# Patient Record
Sex: Female | Born: 1975
Health system: Southern US, Community
[De-identification: ages and names within clinical notes are randomized; demographics above are authoritative.]

## PROBLEM LIST (undated history)

## (undated) DIAGNOSIS — K5730 Diverticulosis of large intestine without perforation or abscess without bleeding: Secondary | ICD-10-CM

## (undated) DIAGNOSIS — G4733 Obstructive sleep apnea (adult) (pediatric): Secondary | ICD-10-CM

## (undated) DIAGNOSIS — F32A Depression, unspecified: Secondary | ICD-10-CM

## (undated) DIAGNOSIS — I10 Essential (primary) hypertension: Secondary | ICD-10-CM

## (undated) DIAGNOSIS — E119 Type 2 diabetes mellitus without complications: Secondary | ICD-10-CM

## (undated) DIAGNOSIS — E282 Polycystic ovarian syndrome: Secondary | ICD-10-CM

## (undated) DIAGNOSIS — E7800 Pure hypercholesterolemia, unspecified: Secondary | ICD-10-CM

## (undated) DIAGNOSIS — G4730 Sleep apnea, unspecified: Secondary | ICD-10-CM

## (undated) DIAGNOSIS — Z9989 Dependence on other enabling machines and devices: Secondary | ICD-10-CM

## (undated) DIAGNOSIS — F329 Major depressive disorder, single episode, unspecified: Secondary | ICD-10-CM

## (undated) HISTORY — DX: Obstructive sleep apnea (adult) (pediatric): G47.33

## (undated) HISTORY — DX: Depression, unspecified: F32.A

## (undated) HISTORY — DX: Major depressive disorder, single episode, unspecified: F32.9

## (undated) HISTORY — DX: Obstructive sleep apnea (adult) (pediatric): Z99.89

## (undated) HISTORY — PX: ABDOMINAL HYSTERECTOMY: SHX81

## (undated) HISTORY — PX: KNEE SURGERY: SHX244

## (undated) HISTORY — PX: OVARIAN CYST REMOVAL: SHX89

## (undated) HISTORY — PX: CYST REMOVAL NECK: SHX6281

---

## 1998-06-07 ENCOUNTER — Emergency Department (HOSPITAL_COMMUNITY): Admission: EM | Admit: 1998-06-07 | Discharge: 1998-06-07 | Payer: Self-pay | Admitting: Emergency Medicine

## 1999-06-26 ENCOUNTER — Encounter: Admission: RE | Admit: 1999-06-26 | Discharge: 1999-07-07 | Payer: Self-pay | Admitting: *Deleted

## 1999-09-04 ENCOUNTER — Emergency Department (HOSPITAL_COMMUNITY): Admission: EM | Admit: 1999-09-04 | Discharge: 1999-09-04 | Payer: Self-pay | Admitting: Emergency Medicine

## 2000-03-16 ENCOUNTER — Other Ambulatory Visit: Admission: RE | Admit: 2000-03-16 | Discharge: 2000-03-16 | Payer: Self-pay | Admitting: *Deleted

## 2000-08-31 ENCOUNTER — Encounter: Admission: RE | Admit: 2000-08-31 | Discharge: 2000-08-31 | Payer: Self-pay | Admitting: Occupational Medicine

## 2000-08-31 ENCOUNTER — Encounter: Payer: Self-pay | Admitting: Occupational Medicine

## 2000-10-15 ENCOUNTER — Observation Stay (HOSPITAL_COMMUNITY): Admission: AD | Admit: 2000-10-15 | Discharge: 2000-10-16 | Payer: Self-pay | Admitting: Obstetrics and Gynecology

## 2000-10-15 ENCOUNTER — Encounter (INDEPENDENT_AMBULATORY_CARE_PROVIDER_SITE_OTHER): Payer: Self-pay

## 2000-11-30 ENCOUNTER — Ambulatory Visit (HOSPITAL_COMMUNITY): Admission: RE | Admit: 2000-11-30 | Discharge: 2000-11-30 | Payer: Self-pay | Admitting: Obstetrics and Gynecology

## 2000-11-30 ENCOUNTER — Encounter (INDEPENDENT_AMBULATORY_CARE_PROVIDER_SITE_OTHER): Payer: Self-pay

## 2001-06-10 ENCOUNTER — Emergency Department (HOSPITAL_COMMUNITY): Admission: EM | Admit: 2001-06-10 | Discharge: 2001-06-11 | Payer: Self-pay | Admitting: Emergency Medicine

## 2001-06-16 ENCOUNTER — Encounter: Payer: Self-pay | Admitting: Surgery

## 2001-06-16 ENCOUNTER — Ambulatory Visit (HOSPITAL_COMMUNITY): Admission: RE | Admit: 2001-06-16 | Discharge: 2001-06-16 | Payer: Self-pay | Admitting: Surgery

## 2001-06-29 ENCOUNTER — Ambulatory Visit (HOSPITAL_COMMUNITY): Admission: RE | Admit: 2001-06-29 | Discharge: 2001-06-29 | Payer: Self-pay | Admitting: General Surgery

## 2002-10-30 ENCOUNTER — Emergency Department (HOSPITAL_COMMUNITY): Admission: EM | Admit: 2002-10-30 | Discharge: 2002-10-30 | Payer: Self-pay | Admitting: Emergency Medicine

## 2003-01-19 ENCOUNTER — Encounter: Payer: Self-pay | Admitting: Emergency Medicine

## 2003-01-19 ENCOUNTER — Emergency Department (HOSPITAL_COMMUNITY): Admission: EM | Admit: 2003-01-19 | Discharge: 2003-01-19 | Payer: Self-pay | Admitting: Emergency Medicine

## 2003-10-30 ENCOUNTER — Ambulatory Visit (HOSPITAL_COMMUNITY): Admission: RE | Admit: 2003-10-30 | Discharge: 2003-10-30 | Payer: Self-pay | Admitting: *Deleted

## 2003-10-30 ENCOUNTER — Encounter (INDEPENDENT_AMBULATORY_CARE_PROVIDER_SITE_OTHER): Payer: Self-pay | Admitting: Specialist

## 2004-06-12 ENCOUNTER — Emergency Department (HOSPITAL_COMMUNITY): Admission: EM | Admit: 2004-06-12 | Discharge: 2004-06-12 | Payer: Self-pay | Admitting: Emergency Medicine

## 2004-08-15 ENCOUNTER — Inpatient Hospital Stay (HOSPITAL_COMMUNITY): Admission: EM | Admit: 2004-08-15 | Discharge: 2004-08-19 | Payer: Self-pay | Admitting: Emergency Medicine

## 2004-08-15 ENCOUNTER — Ambulatory Visit: Payer: Self-pay | Admitting: Internal Medicine

## 2004-08-31 ENCOUNTER — Ambulatory Visit (HOSPITAL_BASED_OUTPATIENT_CLINIC_OR_DEPARTMENT_OTHER): Admission: RE | Admit: 2004-08-31 | Discharge: 2004-08-31 | Payer: Self-pay | Admitting: Internal Medicine

## 2004-08-31 ENCOUNTER — Ambulatory Visit: Payer: Self-pay | Admitting: Internal Medicine

## 2004-09-08 ENCOUNTER — Ambulatory Visit: Payer: Self-pay | Admitting: Internal Medicine

## 2004-10-16 ENCOUNTER — Inpatient Hospital Stay (HOSPITAL_COMMUNITY): Admission: RE | Admit: 2004-10-16 | Discharge: 2004-10-21 | Payer: Self-pay | Admitting: Obstetrics

## 2004-10-16 ENCOUNTER — Encounter (INDEPENDENT_AMBULATORY_CARE_PROVIDER_SITE_OTHER): Payer: Self-pay | Admitting: *Deleted

## 2004-10-16 ENCOUNTER — Ambulatory Visit: Payer: Self-pay | Admitting: Pulmonary Disease

## 2004-10-18 ENCOUNTER — Encounter (INDEPENDENT_AMBULATORY_CARE_PROVIDER_SITE_OTHER): Payer: Self-pay | Admitting: Cardiology

## 2004-10-18 ENCOUNTER — Encounter: Payer: Self-pay | Admitting: Obstetrics

## 2004-11-02 ENCOUNTER — Ambulatory Visit (HOSPITAL_COMMUNITY): Admission: RE | Admit: 2004-11-02 | Discharge: 2004-11-02 | Payer: Self-pay | Admitting: Obstetrics

## 2005-05-04 ENCOUNTER — Emergency Department (HOSPITAL_COMMUNITY): Admission: EM | Admit: 2005-05-04 | Discharge: 2005-05-04 | Payer: Self-pay | Admitting: Family Medicine

## 2005-05-20 ENCOUNTER — Ambulatory Visit: Payer: Self-pay | Admitting: Internal Medicine

## 2005-05-27 ENCOUNTER — Ambulatory Visit (HOSPITAL_COMMUNITY): Admission: RE | Admit: 2005-05-27 | Discharge: 2005-05-27 | Payer: Self-pay | Admitting: Internal Medicine

## 2005-05-28 ENCOUNTER — Ambulatory Visit: Payer: Self-pay | Admitting: Internal Medicine

## 2005-06-10 ENCOUNTER — Ambulatory Visit: Payer: Self-pay | Admitting: Internal Medicine

## 2005-08-20 ENCOUNTER — Encounter: Admission: RE | Admit: 2005-08-20 | Discharge: 2005-08-20 | Payer: Self-pay | Admitting: Emergency Medicine

## 2005-09-07 ENCOUNTER — Encounter: Admission: RE | Admit: 2005-09-07 | Discharge: 2005-09-22 | Payer: Self-pay | Admitting: Emergency Medicine

## 2007-02-01 ENCOUNTER — Emergency Department (HOSPITAL_COMMUNITY): Admission: EM | Admit: 2007-02-01 | Discharge: 2007-02-02 | Payer: Self-pay | Admitting: Emergency Medicine

## 2007-02-24 ENCOUNTER — Telehealth (INDEPENDENT_AMBULATORY_CARE_PROVIDER_SITE_OTHER): Payer: Self-pay | Admitting: *Deleted

## 2007-02-24 DIAGNOSIS — G4730 Sleep apnea, unspecified: Secondary | ICD-10-CM | POA: Insufficient documentation

## 2007-02-24 DIAGNOSIS — I10 Essential (primary) hypertension: Secondary | ICD-10-CM | POA: Insufficient documentation

## 2007-02-24 DIAGNOSIS — E282 Polycystic ovarian syndrome: Secondary | ICD-10-CM | POA: Insufficient documentation

## 2007-02-24 DIAGNOSIS — E119 Type 2 diabetes mellitus without complications: Secondary | ICD-10-CM

## 2007-02-24 DIAGNOSIS — I152 Hypertension secondary to endocrine disorders: Secondary | ICD-10-CM | POA: Insufficient documentation

## 2007-02-24 DIAGNOSIS — G4733 Obstructive sleep apnea (adult) (pediatric): Secondary | ICD-10-CM | POA: Insufficient documentation

## 2007-02-24 DIAGNOSIS — E1165 Type 2 diabetes mellitus with hyperglycemia: Secondary | ICD-10-CM | POA: Insufficient documentation

## 2007-02-24 DIAGNOSIS — Z794 Long term (current) use of insulin: Secondary | ICD-10-CM | POA: Insufficient documentation

## 2007-02-27 ENCOUNTER — Ambulatory Visit: Payer: Self-pay | Admitting: Hospitalist

## 2007-02-27 ENCOUNTER — Encounter (INDEPENDENT_AMBULATORY_CARE_PROVIDER_SITE_OTHER): Payer: Self-pay | Admitting: Internal Medicine

## 2007-02-27 LAB — CONVERTED CEMR LAB
Beta hcg, urine, semiquantitative: NEGATIVE
Blood Glucose, Fingerstick: 148
Hgb A1c MFr Bld: 7.2 %

## 2007-02-28 LAB — CONVERTED CEMR LAB
BUN: 11 mg/dL (ref 6–23)
Calcium: 9.3 mg/dL (ref 8.4–10.5)
Chloride: 104 meq/L (ref 96–112)
Creatinine, Ser: 0.72 mg/dL (ref 0.40–1.20)
Creatinine, Urine: 209.8 mg/dL
Microalb, Ur: 6.17 mg/dL — ABNORMAL HIGH (ref 0.00–1.89)

## 2007-03-13 ENCOUNTER — Ambulatory Visit: Payer: Self-pay | Admitting: Internal Medicine

## 2007-03-13 ENCOUNTER — Encounter (INDEPENDENT_AMBULATORY_CARE_PROVIDER_SITE_OTHER): Payer: Self-pay | Admitting: Internal Medicine

## 2007-03-14 LAB — CONVERTED CEMR LAB
BUN: 14 mg/dL (ref 6–23)
Cholesterol: 228 mg/dL — ABNORMAL HIGH (ref 0–200)
Creatinine, Ser: 0.83 mg/dL (ref 0.40–1.20)
HDL: 29 mg/dL — ABNORMAL LOW (ref >39)
Potassium: 4.3 meq/L (ref 3.5–5.3)
Triglycerides: 391 mg/dL — ABNORMAL HIGH (ref <150)
VLDL: 78 mg/dL — ABNORMAL HIGH (ref 0–40)

## 2007-12-19 ENCOUNTER — Inpatient Hospital Stay (HOSPITAL_COMMUNITY): Admission: EM | Admit: 2007-12-19 | Discharge: 2007-12-20 | Payer: Self-pay | Admitting: Family Medicine

## 2007-12-19 ENCOUNTER — Ambulatory Visit: Payer: Self-pay | Admitting: Cardiology

## 2007-12-19 ENCOUNTER — Ambulatory Visit: Payer: Self-pay | Admitting: Internal Medicine

## 2007-12-20 ENCOUNTER — Encounter (INDEPENDENT_AMBULATORY_CARE_PROVIDER_SITE_OTHER): Payer: Self-pay | Admitting: Internal Medicine

## 2008-01-04 ENCOUNTER — Encounter: Payer: Self-pay | Admitting: Internal Medicine

## 2008-01-17 ENCOUNTER — Ambulatory Visit: Payer: Self-pay | Admitting: Infectious Disease

## 2008-01-18 ENCOUNTER — Encounter: Payer: Self-pay | Admitting: Internal Medicine

## 2008-01-18 ENCOUNTER — Telehealth: Payer: Self-pay | Admitting: Internal Medicine

## 2008-01-29 ENCOUNTER — Encounter: Payer: Self-pay | Admitting: Internal Medicine

## 2008-01-29 ENCOUNTER — Ambulatory Visit (HOSPITAL_BASED_OUTPATIENT_CLINIC_OR_DEPARTMENT_OTHER): Admission: RE | Admit: 2008-01-29 | Discharge: 2008-01-29 | Payer: Self-pay | Admitting: Internal Medicine

## 2008-02-01 ENCOUNTER — Ambulatory Visit: Payer: Self-pay | Admitting: Internal Medicine

## 2008-02-01 LAB — CONVERTED CEMR LAB: Blood Glucose, Home Monitor: 2 mg/dL

## 2008-02-03 ENCOUNTER — Ambulatory Visit: Payer: Self-pay | Admitting: Internal Medicine

## 2008-02-21 ENCOUNTER — Ambulatory Visit: Payer: Self-pay | Admitting: Internal Medicine

## 2008-02-21 DIAGNOSIS — E781 Pure hyperglyceridemia: Secondary | ICD-10-CM | POA: Insufficient documentation

## 2008-02-21 LAB — CONVERTED CEMR LAB: Hgb A1c MFr Bld: 7.6 %

## 2008-02-22 ENCOUNTER — Ambulatory Visit: Payer: Self-pay | Admitting: *Deleted

## 2008-02-22 ENCOUNTER — Encounter: Payer: Self-pay | Admitting: Internal Medicine

## 2008-03-20 LAB — CONVERTED CEMR LAB
Albumin: 4.6 g/dL (ref 3.5–5.2)
BUN: 16 mg/dL (ref 6–23)
CO2: 25 meq/L (ref 19–32)
Calcium: 9.2 mg/dL (ref 8.4–10.5)
Chloride: 105 meq/L (ref 96–112)
Cholesterol: 193 mg/dL (ref 0–200)
Glucose, Bld: 141 mg/dL — ABNORMAL HIGH (ref 70–99)
HDL: 27 mg/dL — ABNORMAL LOW (ref >39)
Lymphs Abs: 2.5 10*3/uL (ref 0.7–4.0)
Monocytes Relative: 7 % (ref 3–12)
Neutro Abs: 3.6 10*3/uL (ref 1.7–7.7)
Neutrophils Relative %: 53 % (ref 43–77)
Potassium: 4.4 meq/L (ref 3.5–5.3)
RBC: 4.56 M/uL (ref 3.87–5.11)
TSH: 1.449 microintl units/mL (ref 0.350–5.50)
Triglycerides: 257 mg/dL — ABNORMAL HIGH (ref <150)
WBC: 6.9 10*3/uL (ref 4.0–10.5)

## 2009-01-08 ENCOUNTER — Emergency Department (HOSPITAL_COMMUNITY): Admission: EM | Admit: 2009-01-08 | Discharge: 2009-01-08 | Payer: Self-pay | Admitting: Family Medicine

## 2009-07-19 ENCOUNTER — Emergency Department (HOSPITAL_COMMUNITY): Admission: EM | Admit: 2009-07-19 | Discharge: 2009-07-19 | Payer: Self-pay | Admitting: Emergency Medicine

## 2009-07-21 ENCOUNTER — Ambulatory Visit: Payer: Self-pay | Admitting: Internal Medicine

## 2009-07-23 ENCOUNTER — Ambulatory Visit: Payer: Self-pay | Admitting: Internal Medicine

## 2009-07-23 LAB — CONVERTED CEMR LAB: Blood Glucose, AC Bkfst: 241 mg/dL

## 2009-07-24 ENCOUNTER — Telehealth (INDEPENDENT_AMBULATORY_CARE_PROVIDER_SITE_OTHER): Payer: Self-pay | Admitting: Internal Medicine

## 2009-07-24 ENCOUNTER — Ambulatory Visit (HOSPITAL_COMMUNITY): Admission: RE | Admit: 2009-07-24 | Discharge: 2009-07-24 | Payer: Self-pay | Admitting: Internal Medicine

## 2009-07-24 LAB — CONVERTED CEMR LAB
Alkaline Phosphatase: 54 units/L (ref 39–117)
Glucose, Bld: 253 mg/dL — ABNORMAL HIGH (ref 70–99)
LDL Cholesterol: 115 mg/dL — ABNORMAL HIGH (ref 0–99)
Microalb Creat Ratio: 19.2 mg/g (ref 0.0–30.0)
Microalb, Ur: 2.22 mg/dL — ABNORMAL HIGH (ref 0.00–1.89)
Sodium: 140 meq/L (ref 135–145)
Total Bilirubin: 0.6 mg/dL (ref 0.3–1.2)
Total Protein: 7.1 g/dL (ref 6.0–8.3)
Triglycerides: 282 mg/dL — ABNORMAL HIGH (ref <150)
VLDL: 56 mg/dL — ABNORMAL HIGH (ref 0–40)

## 2009-07-25 ENCOUNTER — Telehealth: Payer: Self-pay | Admitting: *Deleted

## 2009-08-21 ENCOUNTER — Encounter: Payer: Self-pay | Admitting: Internal Medicine

## 2009-08-21 ENCOUNTER — Encounter (INDEPENDENT_AMBULATORY_CARE_PROVIDER_SITE_OTHER): Payer: Self-pay | Admitting: Internal Medicine

## 2009-08-21 LAB — HM DIABETES EYE EXAM

## 2009-10-16 ENCOUNTER — Ambulatory Visit: Payer: Self-pay | Admitting: Internal Medicine

## 2009-10-16 LAB — CONVERTED CEMR LAB: Blood Glucose, Fingerstick: 225

## 2009-10-25 ENCOUNTER — Encounter: Payer: Self-pay | Admitting: Internal Medicine

## 2009-11-06 ENCOUNTER — Emergency Department (HOSPITAL_COMMUNITY): Admission: EM | Admit: 2009-11-06 | Discharge: 2009-11-06 | Payer: Self-pay | Admitting: Emergency Medicine

## 2009-11-06 ENCOUNTER — Ambulatory Visit: Payer: Self-pay | Admitting: Internal Medicine

## 2009-11-06 LAB — CONVERTED CEMR LAB
ALT: 16 units/L (ref 0–35)
AST: 22 units/L (ref 0–37)
Alkaline Phosphatase: 52 units/L (ref 39–117)
Basophils Absolute: 0 10*3/uL (ref 0.0–0.1)
Basophils Relative: 1 % (ref 0–1)
Chloride: 101 meq/L (ref 96–112)
Creatinine, Ser: 0.75 mg/dL (ref 0.40–1.20)
Eosinophils Absolute: 0.3 10*3/uL (ref 0.0–0.7)
Lymphs Abs: 2.7 10*3/uL (ref 0.7–4.0)
MCV: 89.7 fL (ref >=78.0)
Neutrophils Relative %: 58 % (ref 43–77)
Platelets: 184 10*3/uL (ref 150–400)
RDW: 13.5 % (ref 11.5–15.5)
Total Bilirubin: 0.8 mg/dL (ref 0.3–1.2)
WBC: 8.6 10*3/uL (ref 4.0–10.5)

## 2009-11-15 ENCOUNTER — Emergency Department (HOSPITAL_COMMUNITY): Admission: EM | Admit: 2009-11-15 | Discharge: 2009-11-16 | Payer: Self-pay | Admitting: Emergency Medicine

## 2009-11-20 ENCOUNTER — Ambulatory Visit: Payer: Self-pay | Admitting: Internal Medicine

## 2009-11-21 ENCOUNTER — Telehealth: Payer: Self-pay | Admitting: Internal Medicine

## 2009-11-26 ENCOUNTER — Encounter: Payer: Self-pay | Admitting: Internal Medicine

## 2009-12-30 ENCOUNTER — Ambulatory Visit: Payer: Self-pay | Admitting: Internal Medicine

## 2009-12-30 LAB — CONVERTED CEMR LAB: Hgb A1c MFr Bld: 7.3 %

## 2009-12-31 ENCOUNTER — Encounter: Payer: Self-pay | Admitting: Internal Medicine

## 2010-01-05 ENCOUNTER — Encounter: Payer: Self-pay | Admitting: Internal Medicine

## 2010-01-09 LAB — CONVERTED CEMR LAB
ALT: 18 units/L (ref 0–35)
AST: 21 units/L (ref 0–37)
Albumin: 4.5 g/dL (ref 3.5–5.2)
BUN: 13 mg/dL (ref 6–23)
CO2: 26 meq/L (ref 19–32)
Calcium: 9.8 mg/dL (ref 8.4–10.5)
Chloride: 100 meq/L (ref 96–112)
Cholesterol: 145 mg/dL (ref 0–200)
Creatinine, Ser: 0.76 mg/dL (ref 0.40–1.20)
Eosinophils Absolute: 0.3 10*3/uL (ref 0.0–0.7)
Eosinophils Relative: 3 % (ref 0–5)
HCT: 42.5 % (ref 36.0–46.0)
Lymphocytes Relative: 35 % (ref 12–46)
Lymphs Abs: 3.4 10*3/uL (ref 0.7–4.0)
MCV: 88.4 fL (ref >=78.0)
Monocytes Relative: 7 % (ref 3–12)
Potassium: 4.3 meq/L (ref 3.5–5.3)
RBC: 4.81 M/uL (ref 3.87–5.11)
Total CHOL/HDL Ratio: 6
WBC: 9.7 10*3/uL (ref 4.0–10.5)

## 2010-02-03 ENCOUNTER — Encounter: Payer: Self-pay | Admitting: Internal Medicine

## 2010-02-26 ENCOUNTER — Ambulatory Visit (HOSPITAL_COMMUNITY): Admission: RE | Admit: 2010-02-26 | Discharge: 2010-02-27 | Payer: Self-pay | Admitting: Obstetrics and Gynecology

## 2010-02-26 ENCOUNTER — Encounter (INDEPENDENT_AMBULATORY_CARE_PROVIDER_SITE_OTHER): Payer: Self-pay | Admitting: Obstetrics and Gynecology

## 2010-03-05 ENCOUNTER — Ambulatory Visit: Payer: Self-pay | Admitting: Internal Medicine

## 2010-03-17 ENCOUNTER — Ambulatory Visit: Payer: Self-pay | Admitting: Internal Medicine

## 2010-03-18 ENCOUNTER — Ambulatory Visit: Payer: Self-pay | Admitting: Physical Medicine & Rehabilitation

## 2010-03-18 ENCOUNTER — Ambulatory Visit (HOSPITAL_COMMUNITY)
Admission: RE | Admit: 2010-03-18 | Discharge: 2010-03-18 | Payer: Self-pay | Admitting: Physical Medicine & Rehabilitation

## 2010-03-18 ENCOUNTER — Encounter
Admission: RE | Admit: 2010-03-18 | Discharge: 2010-06-10 | Payer: Self-pay | Admitting: Physical Medicine & Rehabilitation

## 2010-03-22 ENCOUNTER — Encounter
Admission: RE | Admit: 2010-03-22 | Discharge: 2010-03-22 | Payer: Self-pay | Admitting: Physical Medicine & Rehabilitation

## 2010-03-23 ENCOUNTER — Encounter: Payer: Self-pay | Admitting: Internal Medicine

## 2010-03-27 ENCOUNTER — Encounter
Admission: RE | Admit: 2010-03-27 | Discharge: 2010-06-25 | Payer: Self-pay | Admitting: Physical Medicine & Rehabilitation

## 2010-03-30 ENCOUNTER — Encounter: Payer: Self-pay | Admitting: Internal Medicine

## 2010-04-06 ENCOUNTER — Telehealth: Payer: Self-pay | Admitting: Internal Medicine

## 2010-05-01 ENCOUNTER — Ambulatory Visit: Payer: Self-pay | Admitting: Physical Medicine & Rehabilitation

## 2010-06-10 ENCOUNTER — Ambulatory Visit: Payer: Self-pay | Admitting: Physical Medicine & Rehabilitation

## 2010-06-11 ENCOUNTER — Encounter: Payer: Self-pay | Admitting: Internal Medicine

## 2010-06-29 ENCOUNTER — Encounter: Payer: Self-pay | Admitting: Internal Medicine

## 2010-06-29 ENCOUNTER — Encounter
Admission: RE | Admit: 2010-06-29 | Discharge: 2010-09-27 | Payer: Self-pay | Source: Home / Self Care | Attending: Internal Medicine | Admitting: Internal Medicine

## 2010-08-11 ENCOUNTER — Encounter: Payer: Self-pay | Admitting: Internal Medicine

## 2010-08-27 ENCOUNTER — Ambulatory Visit: Payer: Self-pay | Admitting: Internal Medicine

## 2010-08-27 LAB — CONVERTED CEMR LAB
ALT: 11 units/L (ref 0–35)
AST: 17 units/L (ref 0–37)
Albumin: 4.4 g/dL (ref 3.5–5.2)
Basophils Absolute: 0.1 10*3/uL (ref 0.0–0.1)
Blood Glucose, Fingerstick: 148
CO2: 25 meq/L (ref 19–32)
Calcium: 9.1 mg/dL (ref 8.4–10.5)
Chloride: 105 meq/L (ref 96–112)
Cholesterol: 152 mg/dL (ref 0–200)
Creatinine, Ser: 0.92 mg/dL (ref 0.40–1.20)
Eosinophils Relative: 3 % (ref 0–5)
HDL: 26 mg/dL — ABNORMAL LOW (ref >39)
Hgb A1c MFr Bld: 7.2 %
Lymphocytes Relative: 29 % (ref 12–46)
Lymphs Abs: 2.6 10*3/uL (ref 0.7–4.0)
Neutro Abs: 5.6 10*3/uL (ref 1.7–7.7)
Neutrophils Relative %: 61 % (ref 43–77)
Platelets: 191 10*3/uL (ref 150–400)
Potassium: 4.1 meq/L (ref 3.5–5.3)
RDW: 13.6 % (ref 11.5–15.5)
Total CHOL/HDL Ratio: 5.8
Total Protein: 6.6 g/dL (ref 6.0–8.3)
Triglycerides: 459 mg/dL — ABNORMAL HIGH (ref <150)
WBC: 9.1 10*3/uL (ref 4.0–10.5)

## 2010-09-17 ENCOUNTER — Ambulatory Visit (HOSPITAL_BASED_OUTPATIENT_CLINIC_OR_DEPARTMENT_OTHER)
Admission: RE | Admit: 2010-09-17 | Discharge: 2010-09-17 | Payer: Self-pay | Source: Home / Self Care | Attending: Internal Medicine | Admitting: Internal Medicine

## 2010-10-01 ENCOUNTER — Telehealth: Payer: Self-pay | Admitting: *Deleted

## 2010-10-01 ENCOUNTER — Emergency Department (HOSPITAL_COMMUNITY)
Admission: EM | Admit: 2010-10-01 | Discharge: 2010-10-01 | Payer: Self-pay | Source: Home / Self Care | Admitting: Family Medicine

## 2010-10-12 ENCOUNTER — Encounter: Payer: Self-pay | Admitting: Internal Medicine

## 2010-11-02 ENCOUNTER — Telehealth (INDEPENDENT_AMBULATORY_CARE_PROVIDER_SITE_OTHER): Payer: Self-pay | Admitting: *Deleted

## 2010-11-10 NOTE — Letter (Signed)
Summary: MEDLINK  MEDLINK   Imported By: Margie Billet 07/14/2010 14:41:50  _____________________________________________________________________  External Attachment:    Type:   Image     Comment:   External Document

## 2010-11-10 NOTE — Assessment & Plan Note (Signed)
Summary: 1 MONTH F/U/CFB   Vital Signs:  Patient profile:   35 year old female Height:      66 inches (167.64 cm) Weight:      282.7 pounds (128.50 kg) BMI:     45.79 Temp:     98.6 degrees F (37.00 degrees C) oral Pulse rate:   87 / minute BP sitting:   136 / 81  (right arm)  Vitals Entered By: Blenda Mounts (November 20, 2009 8:45 AM) CC: fu Is Patient Diabetic? Yes Did you bring your meter with you today? No Pain Assessment Patient in pain? no      Nutritional Status BMI of > 30 = obese CBG Result 201  Have you ever been in a relationship where you felt threatened, hurt or afraid?No   Does patient need assistance? Functional Status Self care, Shopping Ambulation Normal   CC:  fu.  History of Present Illness: Renee Ho comes in today for routine follow-up on her DM. She has been taking her medications as pr4escribed. She still has issues with intermittent abdominal pain felt to be pain from ovarian cysts. Her energy level is still low especially in teh afternoons when she is working. Otherwise she has no complaints.   Depression History:      The patient denies a depressed mood most of the day and a diminished interest in her usual daily activities.        The patient denies that she feels like life is not worth living, denies that she wishes that she were dead, and denies that she has thought about ending her life.         Preventive Screening-Counseling & Management  Alcohol-Tobacco     Alcohol type: very rarely     Smoking Status: quit     Year Quit: 2-4 years ago  Current Medications (verified): 1)  Metformin Hcl 1000 Mg Tabs (Metformin Hcl) .... Take 1 Tablet By Mouth Two Times A Day 2)  Anacin 81 Mg  Tbec (Aspirin) .... Take 1 Tablet By Mouth Once A Day 30 Minutes Before Niaspan 3)  Fish Oil 1000 Mg  Caps (Omega-3 Fatty Acids) .... Take One With Each Meal 4)  Simvastatin 40 Mg Tabs (Simvastatin) .... Take 1 Tablet By Mouth Once A Day 5)  Aldactone 50 Mg  Tabs (Spironolactone) .... Take 1 Tablet By Mouth Two Times A Day 6)  Victoza 18 Mg/50ml Soln (Liraglutide) .... Inject Daily As Directed  Allergies (verified): 1)  ! Morphine  Physical Exam  General:  alert.  obese,well-hydrated and appropriate dress.   Lungs:  normal breath sounds, no crackles, and no wheezes.   Heart:  normal rate, regular rhythm, no murmur, and no rub.   Abdomen:  soft and non-tender.  central adiposity. Msk:  no joint tenderness and no joint swelling.   Skin:  no rashes. hirsutism Psych:  Oriented X3, normally interactive, and good eye contact.     Impression & Recommendations:  Problem # 1:  DIABETES MELLITUS, TYPE II (ICD-250.00) CBG still high this am. Meter is broken at home. I spoke with her today about possibly going to Victoza or Byetta  in addition to her oral meds. It may have a secondarty benefit of weight loss. Will have her meet with Lupita Leash to discuss options. Will also get her set with meter etc.. and supplies. She is a [employer] employee so some supplies should be free.  Her updated medication list for this problem includes:    Metformin Hcl 1000  Mg Tabs (Metformin hcl) .[name]... Take 1 tablet by mouth two times a day    Anacin 81 Mg Tbec (Aspirin) .[name]... Take 1 tablet by mouth once a day 30 minutes before niaspan    Victoza 18 Mg/18ml Soln (Liraglutide) ..... Inject daily as directed  Orders: Capillary Blood Glucose/CBG (16109)  Labs Reviewed: Creat: 0.75 (11/06/2009)     Last Eye Exam: No diabetic retinopathy.    (08/21/2009) Reviewed HgBA1c results: 8.3 (10/16/2009)  8.5 (07/23/2009)  Problem # 2:  HYPERTRIGLYCERIDEMIA (ICD-272.1) Will check lipids at next visit. No issues with medication.  Her updated medication list for this problem includes:    Simvastatin 40 Mg Tabs (Simvastatin) .[name]... Take 1 tablet by mouth once a day  Problem # 3:  POLYCYSTIC OVARIAN DISEASE (ICD-256.4) Patient has severe monthly pelvic and ovarian pain. Has failed hormonal  treatment. I have her on spironolactone for the hirsuitism. I stressed weight loss. At this point she would like to see an OBGYN for possible TAH/oophorectomy.   Orders: Gynecologic Referral (Gyn)  Complete Medication List: 1)  Metformin Hcl 1000 Mg Tabs (Metformin hcl) .... Take 1 tablet by mouth two times a day 2)  Anacin 81 Mg Tbec (Aspirin) .... Take 1 tablet by mouth once a day 30 minutes before niaspan 3)  Fish Oil 1000 Mg Caps (Omega-3 fatty acids) .... Take one with each meal 4)  Simvastatin 40 Mg Tabs (Simvastatin) .... Take 1 tablet by mouth once a day 5)  Aldactone 50 Mg Tabs (Spironolactone) .... Take 1 tablet by mouth two times a day 6)  Victoza 18 Mg/70ml Soln (Liraglutide) .... Inject daily as directed  Patient Instructions: 1)  Please schedule a follow-up appointment in 1 month with Phillips Odor. Prescriptions: VICTOZA 18 MG/3ML SOLN (LIRAGLUTIDE) Inject daily as directed  #1 x 3   Entered and Authorized by:   Julaine Fusi  DO   Signed by:   Julaine Fusi  DO on 11/23/2009   Method used:   Electronically to        [facility]* (retail)       [address]       [address]mail[address]       [place], [place]  [address]       Ph: [phone]       Fax: [phone]   RxID:   [id number]   Prevention & Chronic Care Immunizations   Influenza vaccine: Not documented    Tetanus booster: Not documented    Pneumococcal vaccine: Not documented  Other Screening   Pap smear: Not documented   Smoking status: quit  (11/20/2009)  Diabetes Mellitus   HgbA1C: 8.3  (10/16/2009)    Eye exam: No diabetic retinopathy.     (08/21/2009)   Eye exam due: 08/2010    Foot exam: yes  (02/27/2007)   High risk foot: Not documented   Foot care education: Not documented    Urine microalbumin/creatinine ratio: 19.2  (07/23/2009)  Lipids   Total Cholesterol: 202  (07/23/2009)   LDL: 115  (07/23/2009)   LDL Direct: Not documented   HDL: 31   (07/23/2009)   Triglycerides: 282  (07/23/2009)    SGOT (AST): 22  (11/06/2009)   SGPT (ALT): 16  (11/06/2009)   Alkaline phosphatase: 52  (11/06/2009)   Total bilirubin: 0.8  (11/06/2009)  Hypertension   Last Blood Pressure: 136 / 81  (11/20/2009)   Serum creatinine: 0.75  (11/06/2009)   Serum potassium 4.1  (11/06/2009)  Self-Management Support :    Patient will work on the following items until the next clinic visit to reach self-care goals:     Medications and monitoring: take my medicines every day, check my blood sugar, check my blood pressure, bring all of my medications to every visit, examine my feet every day  (11/20/2009)     Eating: drink diet soda or water instead of juice or soda, eat more vegetables, use fresh or frozen vegetables, eat foods that are low in salt, eat baked foods instead of fried foods, eat fruit for snacks and desserts  (11/20/2009)     Activity: take a 30 minute walk every day, park at the far end of the parking lot  (11/20/2009)    Diabetes self-management support: Not documented   Last diabetes self-management training by diabetes educator: 02/01/2008   Last medical nutrition therapy: 02/01/2008    Hypertension self-management support: Not documented    Lipid self-management support: Not documented

## 2010-11-10 NOTE — Letter (Signed)
Summary: [facility]: FMLA  [facility]: FMLA   Imported By: Florinda Marker 10/28/2009 13:55:39  _____________________________________________________________________  External Attachment:    Type:   Image     Comment:   External Document

## 2010-11-10 NOTE — Letter (Signed)
Summary: [employer]/04-26/05-25  [employer]/04-26/05-25   Imported By: Margie Billet 03/10/2010 14:04:29  _____________________________________________________________________  External Attachment:    Type:   Image     Comment:   External Document

## 2010-11-10 NOTE — Assessment & Plan Note (Signed)
Summary: F/U/EST/VS   Vital Signs:  Patient profile:   35 year old female Height:      66 inches Weight:      270.44 pounds BMI:     43.81 Temp:     98.4 degrees F oral BP sitting:   128 / 84  (left arm) Cuff size:   large  Vitals Entered By: Angelina Ok RN (Mar 05, 2010 11:39 AM) CC: Depression Is Patient Diabetic? Yes Did you bring your meter with you today? Yes Pain Assessment Patient in pain? yes     Location: arms and abdomen Intensity: 10 Type: aching burning in arms. Sharp in abdomen. Onset of pain  Constant Nutritional Status BMI of > 30 = obese  Does patient need assistance? Functional Status Self care, Cook/clean Ambulation Normal Comments Problems with both arms post surgery on 02/26/2010 unable to pick up and hold items, ie cups water, brush hair, difficulty feeding self.   CC:  Depression.  History of Present Illness: Renee Ho comes in post hysterectomy POD 7 for dysmenorrhagia and fibroids as well as a history of PCOS. The surgery went well and she is having minimal pain except for in her bilateral arms. The pain in her arms started immediately after surgery. The pain is in her shoudlers and upper arms -bilaterllly. Worse with any movement. Some numbness and lancing pains. The patient reports that she has been told it may be damage from positioning during her surgery.  Depression History:      The patient denies a depressed mood most of the day but notes a diminished interest in her usual daily activities.        The patient denies that she feels like life is not worth living, denies that she wishes that she were dead, and denies that she has thought about ending her life.        Comments:  Post surgery on 02/26/2010.   Preventive Screening-Counseling & Management  Alcohol-Tobacco     Alcohol type: very rarely     Smoking Status: quit     Year Quit: 2-4 years ago  Current Medications (verified): 1)  Metformin Hcl 1000 Mg Tabs (Metformin Hcl) ....  Take 1 Tablet By Mouth Two Times A Day 2)  Anacin 81 Mg  Tbec (Aspirin) .... Take 1 Tablet By Mouth Once A Day 30 Minutes Before Niaspan 3)  Fish Oil 1000 Mg  Caps (Omega-3 Fatty Acids) .... Take One With Each Meal 4)  Simvastatin 40 Mg Tabs (Simvastatin) .... Take 1 Tablet By Mouth Once A Day 5)  Aldactone 50 Mg Tabs (Spironolactone) .... Take 1 Tablet By Mouth Two Times A Day 6)  Onetouch Ultra Test  Strp (Glucose Blood) .... Check Cbgs Three Times A Day or As Direrected. 7)  Onetouch .... Meter: Test As Directed 8)  Byetta 5 Mcg Pen 5 Mcg/0.63ml Soln (Exenatide) .... Inject Two Times A Day As Directed 9)  Onetouch Delica Lancets  Misc (Lancets) .... Use As Directed 10)  Prodigy Mini Pen Needles 31g X 5 Mm Misc (Insulin Pen Needle) .... Use As Directed 11)  Diazepam 5 Mg Tabs (Diazepam) .... Take 1 Tablet By Mouth Every 12 Hours As Needed For Muscle Spasm 12)  Lortab 10-500 Mg Tabs (Hydrocodone-Acetaminophen) .... Take 1-2 Tabs By Mouth Every 6 Hours As Needed Pain 13)  Polyethylene Glycol 3350  Pack (Polyethylene Glycol 3350) .... Take One Dose Daily By Mouth As Needed For Constipation As Directed.  Allergies (verified): 1)  !  Morphine  Review of Systems      See HPI  Physical Exam  General:  alert and well-developed.  Mild distress guarding both shoulders. Difficulty getting on exam table. Head:  Normocephalic and atraumatic without obvious abnormalities. No apparent alopecia or balding. Mouth:  Oral mucosa and oropharynx without lesions or exudates.  Teeth in good repair. Neck:  supple and full ROM.   Lungs:  normal breath sounds, no crackles, and no wheezes.   Heart:  normal rate, regular rhythm, no murmur, and no rub.   Abdomen:  Surgical site is clean healing well. Msk:  Intense pain and tenderness with palpation of both arms. Pain worse with aduction and accross the deltoids. No axilary adenopathy or pain. Hypertonicity of the neck and shoulder musculature. Cannot partcipate  with most shoulder and arm movement testing. Cannot perform due to pain. 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 Given the history it is plausible that this is due to OR positioning during a long surgery. I will inject both shoulders today and see if this relieves the inflammation and improves her symptoms. Have her f/u in 2 weeks- if not better will refer for PT and specialist evaluation  Problem # 2:  POLYCYSTIC OVARIAN DISEASE (ICD-256.4) Stable. On metformin and aldactone.  Problem # 3:  DIABETES MELLITUS, TYPE II (ICD-250.00) Doing well. A1C at next visit.  Her updated medication list for this problem includes:    Metformin Hcl 1000 Mg Tabs (Metformin hcl) .[name]... Take 1 tablet by mouth two times a day    Anacin 81 Mg Tbec (Aspirin) .[name]... Take 1 tablet by mouth once a day 30 minutes before niaspan    Byetta 5 Mcg Pen 5 Mcg/0.37ml Soln (Exenatide) ..... Inject two times a day as directed  Problem # 4:  HYPERTENSION (ICD-401.9) Doing well Her updated medication list for this problem includes:    Aldactone 50 Mg Tabs (Spironolactone) .[name]... Take 1 tablet by mouth two times a day  BP today: 128/84 Prior BP: 114/75 (12/30/2009)  Prior 10 Yr Risk Heart Disease: Not enough information (02/27/2007)  Labs Reviewed: K+: 4.3 (12/30/2009) Creat: : 0.76 (12/30/2009)   Chol: 145 (12/30/2009)   HDL: 24 (12/30/2009)   LDL: * mg/dL (08/67/6195)   TG: 093 (12/30/2009)  Complete Medication List: 1)  Metformin Hcl 1000 Mg Tabs (Metformin hcl) .... Take 1 tablet by mouth two times a day 2)  Anacin 81 Mg Tbec (Aspirin) .... Take 1 tablet by mouth once a day 30 minutes before niaspan 3)  Fish Oil 1000 Mg Caps (Omega-3 fatty acids) .... Take one with each meal 4)  Simvastatin 40 Mg Tabs (Simvastatin) .... Take 1 tablet by mouth once a day 5)  Aldactone  50 Mg Tabs (Spironolactone) .... Take 1 tablet by mouth two times a day 6)  Onetouch Ultra Test Strp (Glucose blood) .... Check cbgs three times a day or as direrected. 7)  Onetouch  .... Meter: test as directed 8)  Byetta 5 Mcg Pen 5 Mcg/0.38ml Soln (Exenatide) .... Inject two times a day as directed 9)  Onetouch Delica Lancets Misc (Lancets) .... Use as directed 10)  Prodigy Mini Pen Needles 31g X 5 Mm Misc (Insulin pen needle) .... Use as directed 11)  Diazepam 5 Mg Tabs (Diazepam) .... Take 1 tablet by mouth every 12 hours as needed for muscle  spasm 12)  Lortab 10-500 Mg Tabs (Hydrocodone-acetaminophen) .... Take 1-2 tabs by mouth every 6 hours as needed pain 13)  Polyethylene Glycol 3350 Pack (Polyethylene glycol 3350) .... Take one dose daily by mouth as needed for constipation as directed.  Other Orders: Joint Aspirate / Injection, Intermediate (13086)  Patient Instructions: 1)  [place] appt in July Prescriptions: POLYETHYLENE GLYCOL 3350  PACK (POLYETHYLENE GLYCOL 3350) Take one dose daily by mouth as needed for constipation as directed.  #1 bottle x 3   Entered and Authorized by:   Julaine Fusi  DO   Signed by:   Julaine Fusi  DO on 03/05/2010   Method used:   Print then Give to Patient   RxID:   [id number] LORTAB 10-500 MG TABS (HYDROCODONE-ACETAMINOPHEN) Take 1-2 tabs by mouth every 6 hours as needed pain  #60 x 0   Entered and Authorized by:   Julaine Fusi  DO   Signed by:   Julaine Fusi  DO on 03/05/2010   Method used:   Print then Give to Patient   RxID:   [id number] DIAZEPAM 5 MG TABS (DIAZEPAM) Take 1 tablet by mouth every 12 hours as needed for muscle spasm  #30 x 0   Entered and Authorized by:   Julaine Fusi  DO   Signed by:   Julaine Fusi  DO on 03/05/2010   Method used:   Print then Give to Patient   RxID:   [id number] POLYETHYLENE GLYCOL 3350  PACK (POLYETHYLENE GLYCOL 3350) Take one dose daily by mouth as needed for constipation as directed.  #1  bottle x 3   Entered and Authorized by:   Julaine Fusi  DO   Signed by:   Julaine Fusi  DO on 03/05/2010   Method used:   Print then Give to Patient   RxID:   [id number] LORTAB 10-500 MG TABS (HYDROCODONE-ACETAMINOPHEN) Take 1-2 tabs by mouth every 6 hours as needed pain  #60 x 0   Entered and Authorized by:   Julaine Fusi  DO   Signed by:   Julaine Fusi  DO on 03/05/2010   Method used:   Print then Give to Patient   RxID:   [id number] DIAZEPAM 5 MG TABS (DIAZEPAM) Take 1 tablet by mouth every 12 hours as needed for muscle spasm  #30 x 0   Entered and Authorized by:   Julaine Fusi  DO   Signed by:   Julaine Fusi  DO on 03/05/2010   Method used:   Print then Give to Patient   RxID:   [id number]    Procedure Note  Injections: The patient complains of pain and tenderness but denies irritation and inflammation.    Comment: Bilateral shoulders.   Cleaned and prepped with: alcohol Instructions: ice, out of work, and RTC in 10-14 days Additional Instructions: Used Surveyor, minerals .5:1 Lidocaine injected bilateral sub acromial space and points of maximal tenderness without complication. Rec general ROM at home.  Prevention & Chronic Care Immunizations   Influenza vaccine: Not documented    Tetanus booster: Not documented    Pneumococcal vaccine: Not documented  Other Screening   Pap smear: Not documented   Smoking status: quit  (03/05/2010)  Diabetes Mellitus   HgbA1C: 7.3  (12/30/2009)    Eye exam: No diabetic retinopathy.     (08/21/2009)   Eye exam due: 08/2010    Foot exam: yes  (02/27/2007)   High risk foot: Not documented   Foot care education: Not documented  Urine microalbumin/creatinine ratio: 19.2  (07/23/2009)  Lipids   Total Cholesterol: 145  (12/30/2009)   LDL: * mg/dL  (16/07/9603)   LDL Direct: Not documented   HDL: 24  (12/30/2009)   Triglycerides: 622  (12/30/2009)    SGOT (AST): 21  (12/30/2009)   SGPT (ALT): 18  (12/30/2009)    Alkaline phosphatase: 46  (12/30/2009)   Total bilirubin: 0.5  (12/30/2009)  Hypertension   Last Blood Pressure: 128 / 84  (03/05/2010)   Serum creatinine: 0.76  (12/30/2009)   Serum potassium 4.3  (12/30/2009)  Self-Management Support :    Patient will work on the following items until the next clinic visit to reach self-care goals:     Medications and monitoring: take my medicines every day, check my blood sugar, check my blood pressure, bring all of my medications to every visit, weigh myself weekly, examine my feet every day  (03/05/2010)     Eating: drink diet soda or water instead of juice or soda, eat more vegetables, use fresh or frozen vegetables, eat foods that are low in salt, eat baked foods instead of fried foods, eat fruit for snacks and desserts, limit or avoid alcohol  (03/05/2010)     Activity: take a 30 minute walk every day, take the stairs instead of the elevator  (12/30/2009)    Diabetes self-management support: Copy of home glucose meter record, Written self-care plan, Education handout, Pre-printed educational material  (03/05/2010)   Diabetes care plan printed   Diabetes education handout printed   Last diabetes self-management training by diabetes educator: 11/13/2009   Last medical nutrition therapy: 02/01/2008    Hypertension self-management support: Written self-care plan, Education handout, Pre-printed educational material  (03/05/2010)   Hypertension self-care plan printed.   Hypertension education handout printed    Lipid self-management support: Written self-care plan, Education handout, Pre-printed educational material  (03/05/2010)   Lipid self-care plan printed.   Lipid education handout printed

## 2010-11-10 NOTE — Assessment & Plan Note (Signed)
Summary: scheduled app/gg   Vital Signs:  Patient profile:   35 year old female Height:      66 inches (167.64 cm) Weight:      282.04 pounds (128.20 kg) BMI:     45.69 Temp:     97.2 degrees F (36.22 degrees C) oral Pulse rate:   90 / minute BP sitting:   132 / 78  (left arm) Cuff size:   large  Vitals Entered By: Angelina Ok RN (August 27, 2010 1:26 PM) CC: Depression Is Patient Diabetic? Yes Did you bring your meter with you today? Yes Pain Assessment Patient in pain? yes     Location: left foot  Intensity: 2 Type: aching Onset of pain  new shoes Nutritional Status BMI of > 30 = obese CBG Result 148  Have you ever been in a relationship where you felt threatened, hurt or afraid?No   Does patient need assistance? Functional Status Self care Ambulation Normal Comments Wants to have ears look up.   CC:  Depression.  History of Present Illness: Geoffery Spruce comes in today for routine care. She has been doing a great job with her diabetes and has recovered fully from an entrapment neuropathy of her upper extremities following GYN surgery several months ago. She overall reports doing very well. Significant imprrovement in her abdominal pain symptoms and feels much better post hysterectomy and free of menorrhagia that she had experienced prior to her surgery. She tells me that she feels like her health goals are attaianable- she is highly motivated. Concerns today include worsening facial hirsuitism and a new neuropathy in her feet. she says if she sits with her legs dangling that her feet get parasthesias and a several white spots develop on the plantar surfaces of her feet. She reporst doing well with the Byetta.   [facility] needs labs [phone] Attn Rose     Depression History:      The patient denies a depressed mood most of the day and a diminished interest in her usual daily activities.        The patient denies that she feels like life is not worth living, denies  that she wishes that she were dead, and denies that she has thought about ending her life.         Preventive Screening-Counseling & Management  Alcohol-Tobacco     Alcohol type: very rarely     Smoking Status: quit     Year Quit: 2-4 years ago  Current Medications (verified): 1)  Metformin Hcl 1000 Mg Tabs (Metformin Hcl) .... Take 1 Tablet By Mouth Two Times A Day 2)  Anacin 81 Mg  Tbec (Aspirin) .... Take 1 Tablet By Mouth Once A Day 30 Minutes Before Niaspan 3)  Fish Oil 1000 Mg  Caps (Omega-3 Fatty Acids) .... Take One With Each Meal 4)  Simvastatin 40 Mg Tabs (Simvastatin) .... Take 1 Tablet By Mouth Once A Day 5)  Aldactone 50 Mg Tabs (Spironolactone) .... Take 1 Tablet By Mouth Two Times A Day 6)  Onetouch Ultra Test  Strp (Glucose Blood) .... Check Cbgs Three Times A Day or As Direrected. 7)  Onetouch .... Meter: Test As Directed 8)  Byetta 5 Mcg Pen 5 Mcg/0.41ml Soln (Exenatide) .... Inject Two Times A Day As Directed 9)  Onetouch Delica Lancets  Misc (Lancets) .... Use As Directed 10)  Prodigy Mini Pen Needles 31g X 5 Mm Misc (Insulin Pen Needle) .... Use As Directed 11)  Polyethylene Glycol 3350  Pack (Polyethylene Glycol 3350) .... Take One Dose Daily By Mouth As Needed For Constipation As Directed. 12)  Gabapentin 300 Mg Caps (Gabapentin) .... Start Taking One Tab By Mouth At Bedtime X 1 Day Then Increase To Twice Daily 13)  Voltaren 1 % Gel (Diclofenac Sodium) .... Apply To Both Shoulders Twice Daily As Needed For Pain  Allergies: 1)  ! Morphine 2)  ! Reglan  Review of Systems      See HPI  Physical Exam  General:  alert and well-developed.  NAD.  Neck:  supple and no masses.   Lungs:  normal respiratory effort and normal breath sounds.   Heart:  normal rate, regular rhythm, and no murmur.   Abdomen:  soft and non-tender.   Msk:  no joint tenderness and no joint swelling.   Skin:  multiple skin tags around her neck, hirsutism Psych:  Oriented X3 and normally  interactive.    Diabetes Management Exam:    Foot Exam (with socks and/or shoes not present):       Sensory-Monofilament:          Left foot: normal          Right foot: normal   Impression & Recommendations:  Problem # 1:  HYPERTRIGLYCERIDEMIA (ICD-272.1) Will repeat Fasting labs. Had difficulty tolerating Niaspan.   Her updated medication list for this problem includes:    Simvastatin 40 Mg Tabs (Simvastatin) .[name]... 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 Needs a replacement in order to be effective. I suspect her needs will be different. Order placed for titration study.  Problem # 4:  POLYCYSTIC OVARIAN DISEASE (ICD-256.4) Concern over hirsutism. I have her on spironalactone, but she would likely need higher dose for androgen blocking- her BP however is under great control. So I will not change for now. We discussed topical options and dermatology referral.   Problem # 5:  DIABETES MELLITUS, TYPE II (ICD-250.00)  Discussed possiblly increasing her Byetta- her A1C has improved dramaticallly to 7.2- ideally would like to get her below 7.0. Will continue with diet and weightloss- her trend in A1C has been down- once she plateaus I will go up on the Byetta. Labs today.   Her updated medication list for this problem includes:    Metformin Hcl 1000 Mg Tabs (Metformin hcl) .[name]... Take 1 tablet by mouth two times a day    Anacin 81 Mg Tbec (Aspirin) .[name]... Take 1 tablet by mouth once a day 30 minutes before niaspan    Byetta 5 Mcg Pen 5 Mcg/0.31ml Soln (Exenatide) ..... Inject two times a day as directed  Orders: T- Capillary Blood Glucose (16109) T-Hgb A1C (in-house) (60454UJ) T-CMP with Estimated GFR ([id number]) T-Lipid Profile (95621-30865) T-CBC w/Diff (78469-62952)  Labs Reviewed: Creat:  0.76 (12/30/2009)     Last Eye Exam: No diabetic retinopathy.    (08/21/2009) Reviewed HgBA1c results: 7.2 (08/27/2010)  7.3 (12/30/2009)  Problem # 6:  HYPERTENSION (ICD-401.9)  Her updated medication list for this problem includes:    Aldactone 50 Mg Tabs (Spironolactone) .[name]... Take 1 tablet by mouth two times a day  Complete Medication List: 1)  Metformin Hcl 1000 Mg Tabs (Metformin hcl) .... Take 1 tablet by mouth two times a day 2)  Anacin 81 Mg Tbec (Aspirin) .Marland KitchenMarland KitchenMarland Kitchen  Take 1 tablet by mouth once a day 30 minutes before niaspan 3)  Fish Oil 1000 Mg Caps (Omega-3 fatty acids) .... Take one with each meal 4)  Simvastatin 40 Mg Tabs (Simvastatin) .... Take 1 tablet by mouth once a day 5)  Aldactone 50 Mg Tabs (Spironolactone) .... Take 1 tablet by mouth two times a day 6)  Onetouch Ultra Test Strp (Glucose blood) .... Check cbgs three times a day or as direrected. 7)  Onetouch  .... Meter: test as directed 8)  Byetta 5 Mcg Pen 5 Mcg/0.54ml Soln (Exenatide) .... Inject two times a day as directed 9)  Onetouch Delica Lancets Misc (Lancets) .... Use as directed 10)  Prodigy Mini Pen Needles 31g X 5 Mm Misc (Insulin pen needle) .... Use as directed 11)  Polyethylene Glycol 3350 Pack (Polyethylene glycol 3350) .... Take one dose daily by mouth as needed for constipation as directed. 12)  Gabapentin 300 Mg Caps (Gabapentin) .... Start taking one tab by mouth at bedtime x 1 day then increase to twice daily 13)  Voltaren 1 % Gel (Diclofenac sodium) .... Apply to both shoulders twice daily as needed for pain  Patient Instructions: 1)  Please schedule a follow-up appointment in 3 months.   Orders Added: 1)  T- Capillary Blood Glucose [82948] 2)  T-Hgb A1C (in-house) [[id number]] 3)  T-CMP with Estimated GFR [[address]] 4)  T-Lipid Profile [[id number]] 5)  T-CBC w/Diff [[id number]] 6)  Est. Patient Level IV [84132]    Prevention & Chronic Care Immunizations   Influenza vaccine: Given at  [place]/Employee  (08/27/2010)   Influenza vaccine due: 06/12/2011    Tetanus booster: Not documented   Td booster deferral: Deferred  (08/27/2010)    Pneumococcal vaccine: Not documented  Other Screening   Pap smear: Not documented   Pap smear action/deferral: Not indicated S/P hysterectomy  (08/27/2010)   Smoking status: quit  (08/27/2010)  Diabetes Mellitus   HgbA1C: 7.2  (08/27/2010)    Eye exam: No diabetic retinopathy.     (08/21/2009)   Diabetic eye exam action/deferral: Ophthalmology referral  (08/27/2010)   Eye exam due: 08/21/2010    Foot exam: yes  (08/27/2010)   High risk foot: No  (08/27/2010)   Foot care education: Done  (08/27/2010)   Foot exam due: 08/23/2011    Urine microalbumin/creatinine ratio: 19.2  (07/23/2009)   Urine microalbumin action/deferral: Not indicated    Diabetes flowsheet reviewed?: Yes   Progress toward A1C goal: Improved    Stage of readiness to change (diabetes management): Action   Diabetes comments: Motivated -doing very well  Lipids   Total Cholesterol: 145  (12/30/2009)   LDL: * mg/dL  (44/10/270)   LDL Direct: Not documented   HDL: 24  (12/30/2009)   Triglycerides: 622  (12/30/2009)    SGOT (AST): 21  (12/30/2009)   SGPT (ALT): 18  (12/30/2009)   Alkaline phosphatase: 46  (12/30/2009)   Total bilirubin: 0.5  (12/30/2009)    Lipid flowsheet reviewed?: Yes   Progress toward LDL goal: Deteriorated    Stage of readiness to change (lipid management): Action   Lipid comments: Has triglyceride disorder  Hypertension   Last Blood Pressure: 132 / 78  (08/27/2010)   Serum creatinine: 0.76  (12/30/2009)   Serum potassium 4.3  (12/30/2009)  Self-Management Support :    Patient will work on the following items until the next clinic visit to reach self-care goals:     Medications and monitoring: take my medicines every  day, check my blood sugar, bring all of my medications to every visit, examine my feet every day   (08/27/2010)     Eating: drink diet soda or water instead of juice or soda, eat more vegetables, use fresh or frozen vegetables, eat foods that are low in salt, eat baked foods instead of fried foods, eat fruit for snacks and desserts, limit or avoid alcohol  (08/27/2010)     Activity: take a 30 minute walk every day  (08/27/2010)    Diabetes self-management support: Written self-care plan, Education handout, Pre-printed educational material, Resources for patients handout  (08/27/2010)   Diabetes care plan printed   Diabetes education handout printed   Last diabetes self-management training by diabetes educator: 11/13/2009   Last medical nutrition therapy: 02/01/2008    Hypertension self-management support: Written self-care plan, Education handout, Pre-printed educational material, Resources for patients handout  (08/27/2010)   Hypertension self-care plan printed.   Hypertension education handout printed    Lipid self-management support: Written self-care plan, Education handout, Pre-printed educational material, Resources for patients handout  (08/27/2010)   Lipid self-care plan printed.   Lipid education handout printed      Resource handout printed.   Nursing Instructions: Refer for screening diabetic eye exam (see order)     Vital Signs:  Patient profile:   35 year old female Height:      66 inches (167.64 cm) Weight:      282.04 pounds (128.20 kg) BMI:     45.69 Temp:     97.2 degrees F (36.22 degrees C) oral Pulse rate:   90 / minute BP sitting:   132 / 78  (left arm) Cuff size:   large  Vitals Entered By: Angelina Ok RN (August 27, 2010 1:26 PM)    Last LDL:                                                 * mg/dL (16/07/9603 5:40:98 PM)            Diabetic Foot Exam Last Podiatry Exam Date: 02/21/2008 Foot Inspection Is there a history of a foot ulcer?              No Is there a foot ulcer now?              No Is there swelling or an abnormal foot  shape?          Yes Are the toenails long?                No Are the toenails thick?                No Are the toenails ingrown?              No Is there heavy callous build-up?              No Is there a claw toe deformity?                          No Is there elevated skin temperature?            No Is there limited ankle dorsiflexion?            No Is there foot or ankle muscle weakness?  No Do you have pain in calf while walking?           No      Diabetic Foot Care Education :Patient educated on appropriate care of diabetic feet.  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 High Risk Feet? No Set Next Diabetic Foot Exam here: 08/23/2011   10-g (5.07) Semmes-Weinstein Monofilament Test Performed by: Angelina Ok RN          Right Foot          Left Foot Visual Inspection               Test Control      normal         normal Site 1         normal         normal Site 2         normal         normal Site 3         normal         normal Site 4         normal         normal Site 5         normal         normal Site 6         normal         normal Site 7         normal         normal Site 8         normal         normal Site 9         normal         normal Site 10         normal         normal  Impression      normal         normal  Legend:  Site 1 = Plantar aspect of first toe (center of pad) Site 2 = Plantar aspect of third toe (center of pad) Site 3 = Plantar aspect of fifth toe (center of pad) Site 4 = Plantar aspect of first metatarsal head Site 5 = Plantar aspect of third metatarsal head Site 6 = Plantar aspect of fifth metatarsal head Site 7 = Plantar aspect of medial midfoot Site 8 = Plantar aspect of lateral midfoot Site 9 = Plantar aspect of heel Site 10 = dorsal aspect of foot between the base of the first and  second toes   Result is Abnormal if patient was unable to perceive the monofilament at site indicated.   Process Orders Check Orders Results:     [facility]: ABN not required for this insurance Tests Sent for requisitioning (August 31, 2010 11:30 AM):     08/27/2010: [facility] -- T-CMP with Estimated GFR [[id number]] (signed)     08/27/2010: [facility] -- T-Lipid Profile [phone] (signed)     08/27/2010: [facility] -- T-CBC w/Diff [[id number]] (signed)  Laboratory Results   Blood Tests   Date/Time Received: August 27, 2010 2:10 PM  Date/Time Reported: Burke Keels  August 27, 2010 2:10 PM   HGBA1C: 7.2%   (Normal Range: Non-Diabetic - 3-6%   Control Diabetic - 6-8%) CBG Random:: 148mg /dL

## 2010-11-10 NOTE — Assessment & Plan Note (Signed)
Summary: EST-TO ASSESS FOR FMLA PAPERS/CFB   Vital Signs:  Patient profile:   35 year old female Height:      66 inches (167.64 cm) Weight:      283.01 pounds (128.64 kg) BMI:     45.84 Temp:     98.3 degrees F (36.83 degrees C) oral Pulse rate:   76 / minute BP sitting:   137 / 85  (right arm)  Vitals Entered By: Angelina Ok RN (October 16, 2009 11:23 AM) Is Patient Diabetic? Yes Did you bring your meter with you today? No Pain Assessment Patient in pain? no      Nutritional Status BMI of > 30 = obese CBG Result 225  Have you ever been in a relationship where you felt threatened, hurt or afraid?No   Does patient need assistance? Functional Status Self care Ambulation Normal   History of Present Illness: "Renee Ho" is a 35 year old with severe PCOS and Diabetes who I initially saw a couple of years ago but has not followed up until now. She tells me that in general she dislikes going to the doctor, is mistrustful from bad experiences in the past. Her husband is here with her for support. She is mostly concerned about her diabetes and the effects of her PCOS on her body. She has monthly right LQ colicky pain- likely ovarina with negative CT w/u in recent past. She is minimally compliant with her dm treatment, and says she knows she has to do better. In regards to her PCOS, she wants a referral to a new GYN, since her last GYN removed a dermoid and this was complicated by wound infection and major complications that resulted in her being out of work for several months.  Preventive Screening-Counseling & Management  Alcohol-Tobacco     Alcohol type: very rarely     Smoking Status: quit     Year Quit: 2-4 years ago  Allergies: 1)  ! Morphine  Physical Exam  General:  alert.  obese,well-hydrated and appropriate dress.   Head:  normocephalic.   Eyes:  pupils equal, pupils round, and pupils reactive to light.   Neck:  supple.   Abdomen:  soft and non-tender.  central  adiposity. Msk:  no joint tenderness and no joint swelling.   Extremities:  trace left pedal edema and trace right pedal edema.   Skin:  no rashes. hirsutism Psych:  Oriented X3, normally interactive, and good eye contact.     Impression & Recommendations:  Problem # 1:  DIABETES MELLITUS, TYPE II (ICD-250.00) Leann admits to not taking her medication regularly. I think the first place to start is having her take her meds as presribed until her next visit in 1 month. At that point we can recheck her A1C and CBG. I would also like her to at least be checking her AM fasting blood sugars. She doesnt have a meter so I will have to set her up with DR for these supplys. She also needs quite a bit of education. 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  For today: Incresaed Metformin to 1000mg  two times a day Appt w/ DR Check CBGs F/U in 1 month  The following medications were removed from the medication list:    Lisinopril 20 Mg Tabs (Lisinopril) .[name]... Take 1 tablet by mouth once a day Her updated medication list for this problem includes:    Metformin Hcl 1000 Mg Tabs (Metformin hcl) .Marland KitchenMarland KitchenMarland KitchenMarland Kitchen  Take 1 tablet by mouth two times a day    Anacin 81 Mg Tbec (Aspirin) .[name]... Take 1 tablet by mouth once a day 30 minutes before niaspan  Orders: T- Capillary Blood Glucose (82948) T-Hgb A1C (in-house) ([id number])  Her updated medication list for this problem includes:    Lisinopril 20 Mg Tabs (Lisinopril) .[name]... Take 1 tablet by mouth once a day    Glucophage 850 Mg Tabs (Metformin hcl) .[name]... Take 1 tablet by mouth two times a day    Anacin 81 Mg Tbec (Aspirin) .[name]... Take 1 tablet by mouth once a day 30 minutes before niaspan  Problem # 2:  POLYCYSTIC OVARIAN DISEASE (ICD-256.4) I spent >20 minutes on weight loss educatiion and planning. This would heklp her disease tremndously. She is on Metformin. I have also started her on Spironolactone 50 two times a day for  anti-androgen effects and for her Hypertension. Will need to check her labs in 2 weeks.  Future Orders: T-Comprehensive Metabolic Panel [id number]) ... 11/06/2009 T-CBC w/Diff ([id number]) ... 11/06/2009  Problem # 3:  HYPERTRIGLYCERIDEMIA (ICD-272.1) The patient is intolerant to Niacin. I have changed her to Zocor- a more high potency statin vs. Pravachol. Will check LFTs in 6 weeks and re-check lipids  in 3 months. The following medications were removed from the medication list:    Niacin Cr 1000 Mg Cr-tabs (Niacin) .[name]... Take 1 pill by mouth at bedtime. Her updated medication list for this problem includes:    Simvastatin 40 Mg Tabs (Simvastatin) .[name]... Take 1 tablet by mouth once a day  Complete Medication List: 1)  Metformin Hcl 1000 Mg Tabs (Metformin hcl) .... Take 1 tablet by mouth two times a day 2)  Anacin 81 Mg Tbec (Aspirin) .... Take 1 tablet by mouth once a day 30 minutes before niaspan 3)  Fish Oil 1000 Mg Caps (Omega-3 fatty acids) .... Take one with each meal 4)  Simvastatin 40 Mg Tabs (Simvastatin) .... Take 1 tablet by mouth once a day 5)  Aldactone 50 Mg Tabs (Spironolactone) .... Take 1 tablet by mouth two times a day  Patient Instructions: 1)  Please schedule a follow-up appointment in 1 month. 2)  Follow up labs in 2 weeks. 3)  Appointment with Jamison Neighbor Prescriptions: ALDACTONE 50 MG TABS (SPIRONOLACTONE) Take 1 tablet by mouth two times a day  #60 x 3   Entered and Authorized by:   Julaine Fusi  DO   Signed by:   Julaine Fusi  DO on 10/16/2009   Method used:   Electronically to        [facility]* (retail)       [address]       [address] Shipping/mailing       [place], [place]  [address]       Ph: [phone]       Fax: [phone]   RxID:   [id number] SIMVASTATIN 40 MG TABS (SIMVASTATIN) Take 1 tablet by mouth once a day  #30 x 3   Entered and Authorized by:   Julaine Fusi  DO   Signed by:   Julaine Fusi  DO on  10/16/2009   Method used:   Electronically to        [facility]* (retail)       [address]       [address] Shipping/mailing       [place], [place]  [address]  Ph: [phone]       Fax: [phone]   RxID:   [id number] METFORMIN HCL 1000 MG TABS (METFORMIN HCL) Take 1 tablet by mouth two times a day  #60 x 3   Entered and Authorized by:   Julaine Fusi  DO   Signed by:   Julaine Fusi  DO on 10/16/2009   Method used:   Electronically to        [facility]* (retail)       [address]       [address] Shipping/mailing       [place], [place]  [address]       Ph: [phone]       Fax: [phone]   RxID:   [id number]   Prevention & Chronic Care Immunizations   Influenza vaccine: Not documented    Tetanus booster: Not documented    Pneumococcal vaccine: Not documented  Other Screening   Pap smear: Not documented   Smoking status: quit  (10/16/2009)  Diabetes Mellitus   HgbA1C: 8.3  (10/16/2009)    Eye exam: No diabetic retinopathy.     (08/21/2009)   Eye exam due: 08/2010    Foot exam: yes  (02/27/2007)   High risk foot: Not documented   Foot care education: Not documented    Urine microalbumin/creatinine ratio: 19.2  (07/23/2009)  Lipids   Total Cholesterol: 202  (07/23/2009)   LDL: 115  (07/23/2009)   LDL Direct: Not documented   HDL: 31  (07/23/2009)   Triglycerides: 282  (07/23/2009)    SGOT (AST): 21  (07/23/2009)   SGPT (ALT): 21  (07/23/2009) CMP ordered    Alkaline phosphatase: 54  (07/23/2009)   Total bilirubin: 0.6  (07/23/2009)  Hypertension   Last Blood Pressure: 137 / 85  (10/16/2009)   Serum creatinine: 0.68  (07/23/2009)   Serum potassium 4.5  (07/23/2009) CMP ordered   Self-Management Support :    Patient will work on the following items until the next clinic visit to reach self-care goals:     Medications and monitoring: take my medicines every day, check my blood sugar,  check my blood pressure, bring all of my medications to every visit  (10/16/2009)     Eating: drink diet soda or water instead of juice or soda, eat foods that are low in salt, eat baked foods instead of fried foods, eat fruit for snacks and desserts  (10/16/2009)     Activity: take a 30 minute walk every day, park at the far end of the parking lot  (10/16/2009)    Diabetes self-management support: Not documented   Last diabetes self-management training by diabetes educator: 02/01/2008   Last medical nutrition therapy: 02/01/2008    Hypertension self-management support: Not documented    Lipid self-management support: Not documented   Process Orders Check Orders Results:     [facility]: ABN not required for this insurance Tests Sent for requisitioning (October 20, 2009 12:07 PM):     11/06/2009: [facility] -- T-Comprehensive Metabolic Panel [[id number]] (signed)     11/06/2009: [facility] -- [facility] w/Diff [[id number]] (signed)    Laboratory Results   Blood Tests   Date/Time Received: October 16, 2009 11:33 AM Date/Time Reported: Alric Quan  October 16, 2009 11:33 AM  HGBA1C: 8.3%   (Normal Range: Non-Diabetic - 3-6%   Control Diabetic - 6-8%) CBG Random:: 225mg /dL

## 2010-11-10 NOTE — Progress Notes (Signed)
  Phone Note Outgoing Call   Call placed by: Jamison Neighbor RD,CDE,  November 21, 2009 11:04 AM Summary of Call: Patient request prescription for One touch ultra test strips, Byetta pen, 5mm pen needles, One touch delica lancets, The prescriptions were written out by DR. Woodyear and given to triage nurse to give to patient on Monday.  Follow-up for Phone Call        scripts entered into computer Follow-up by: Julaine Fusi  DO,  November 23, 2009 1:31 PM    New/Updated Medications: ONETOUCH ULTRA TEST  STRP (GLUCOSE BLOOD) Check CBGs three times a day or as direrected. * ONETOUCH Meter: test as directed BYETTA 5 MCG PEN 5 MCG/0.02ML SOLN (EXENATIDE) Inject two times a day as directed Prescriptions: BYETTA 5 MCG PEN 5 MCG/0.02ML SOLN (EXENATIDE) Inject two times a day as directed  #1 x 11   Entered and Authorized by:   Julaine Fusi  DO   Signed by:   Julaine Fusi  DO on 11/23/2009   Method used:   Handwritten   RxID:   [id number] ONETOUCH Meter: test as directed  #1 x 0   Entered and Authorized by:   Julaine Fusi  DO   Signed by:   Julaine Fusi  DO on 11/23/2009   Method used:   Handwritten   RxID:   [id number] ONETOUCH ULTRA TEST  STRP (GLUCOSE BLOOD) Check CBGs three times a day or as direrected.  #100 x 3   Entered and Authorized by:   Julaine Fusi  DO   Signed by:   Julaine Fusi  DO on 11/23/2009   Method used:   Electronically to        [facility] [address] [id number] (retail[facility]       [address]       [place], [place]  [address]       Ph: [phone]       Fax: [phone]   RxID:   [id number]   Appended Document:  these were called to [facility]

## 2010-11-10 NOTE — Miscellaneous (Signed)
Summary: [facility]  [facility]   Imported By: Margie Billet 01/08/2010 10:25:05  _____________________________________________________________________  External Attachment:    Type:   Image     Comment:   External Document

## 2010-11-10 NOTE — Letter (Signed)
Summary: [facility]  [facility]   Imported By: Margie Billet 07/15/2010 13:50:50  _____________________________________________________________________  External Attachment:    Type:   Image     Comment:   External Document

## 2010-11-10 NOTE — Progress Notes (Signed)
Summary: phone/gg  Phone Note Call from Patient   Caller: Patient Summary of Call: Pt called with questions about her FMLA.  Can you call her? She wants to talk about her visit with Dr Hermelinda Medicus.  Pt # [id number] Pt is on short term disability.  She is requesting her office notes  since surgery on May 19th be sent to [facility]  Fax # [phone]    claim # [phone]  and SS# [id number]      Initial call taken by: Merrie Roof RN,  April 06, 2010 11:11 AM  Follow-up for Phone Call        Called patient to follow-up. Got voicemail. I requested a return call. Follow-up by: Julaine Fusi  DO,  April 07, 2010 11:10 AM

## 2010-11-10 NOTE — Medication Information (Signed)
Summary: [facility] MEDICATION LIST  MED[facility] MEDICATION LIST   Imported By: Margie Billet 08/24/2010 15:23:13  _____________________________________________________________________  External Attachment:    Type:   Image     Comment:   External Document

## 2010-11-10 NOTE — Miscellaneous (Signed)
Summary: [facility]ICAL [facility]GICAL  [facility]   Imported By: Margie Billet 03/10/2010 10:42:15  _____________________________________________________________________  External Attachment:    Type:   Image     Comment:   External Document

## 2010-11-10 NOTE — Letter (Signed)
Summary: *Referral Letter  [facility]  [address]   [place], [place] [address]   Phone: [phone]  Fax: [phone]   12/31/2009  Thank you in advance for agreeing to see my patient:  Renee Ho [address] [place], [place]  [address]  Dear Dr. Cherly Hensen:  I have referred Renee Ho to you for evaulation and management of Renee Ho PCOS, Mennorhagia, and Chronic Pelvic Pain. From a medical standpoint, I have been managing the endocrinopathy with spironolactone and metformin. She has a long history of anovulatory infertility and has no plans or wishes for future pregnancy. We have been working on nutrition therapy, diabetes control and weight loss. Additionally, she has chronic mid to late cycle pelvic pain that she describes as being quite disabiling. Abdominal imaging has shown ovarian cysts. At this point I feel that I have maximized a medical approach and that she would likely be a good candidate for gynecologic evaluation and surgical intervention given the severity of Renee Ho symptoms.   Thank you in advance for you thoughtful consideration and assistance with Renee Ho case. Please contact me if you have any further questions or if I can be of assistance. A summary of Renee Ho medical record is below.  Reason for Referral: PCOS  Current Medical Problems: 1)  ABDOMINAL WALL PAIN (ICD-789.09) 2)  HYPERTRIGLYCERIDEMIA (ICD-272.1) 3)  POLYCYSTIC OVARIAN DISEASE (ICD-256.4) 4)  DERMOID CYST (ICD-229.9) 5)  SLEEP APNEA (ICD-780.57) 6)  HYPERTENSION (ICD-401.9) 7)  DIVERTICULITIS, HX OF (ICD-V12.79) 8)  DIABETES MELLITUS, TYPE II (ICD-250.00)  Current Medications: 1)  METFORMIN HCL 1000 MG TABS (METFORMIN HCL) Take 1 tablet by mouth two times a day 2)  ANACIN 81 MG  TBEC (ASPIRIN) Take 1 tablet by mouth once a day 30 minutes before Niaspan 3)  FISH OIL 1000 MG  CAPS (OMEGA-3 FATTY ACIDS) take one with each meal 4)  SIMVASTATIN 40 MG TABS (SIMVASTATIN) Take 1  tablet by mouth once a day 5)  ALDACTONE 50 MG TABS (SPIRONOLACTONE) Take 1 tablet by mouth two times a day 6)  BYETTA 5 MCG PEN 5 MCG/0.02ML SOLN (EXENATIDE) Inject two times a day as directed  Sincerely,    [employer]  DO

## 2010-11-10 NOTE — Letter (Signed)
Summary: CERTIFICATION OF HEALTH CARE PROVIDER  CERTIFICATION OF HEALTH CARE PROVIDER   Imported By: Margie Billet 04/01/2010 13:42:59  _____________________________________________________________________  External Attachment:    Type:   Image     Comment:   External Document

## 2010-11-10 NOTE — Assessment & Plan Note (Signed)
Summary: PER DR Phillips Odor, SHE WILL SEE/PCP-GOLDING/HLA   Vital Signs:  Patient profile:   35 year old female Height:      66 inches Weight:      270.0 pounds BMI:     43.74 Temp:     97.6 degrees F oral Pulse rate:   67 / minute BP sitting:   111 / 73  (right arm)  Vitals Entered By: Filomena Jungling NT II (March 17, 2010 8:44 AM) CC: follow-up visit Is Patient Diabetic? Yes Did you bring your meter with you today? No Nutritional Status BMI of > 30 = obese  Does patient need assistance? Functional Status Self care Ambulation Normal   CC:  follow-up visit.  History of Present Illness: Patient returns to office for follow-up on possible post-op upper extremity neuropathy/Upper extremity pain and weakness persist. Pain is 8/10 with lifting, light touch, upper lateral arms "burn". Steroid injections at last visit reduced some pain but movement is still a problem.  Cannot do basic tasks with UE at this point. Severe limitation present.   [phone] Dr. Riley Kill [phone] office  Preventive Screening-Counseling & Management  Alcohol-Tobacco     Alcohol type: very rarely     Smoking Status: quit     Year Quit: 2-4 years ago  Caffeine-Diet-Exercise     Does Patient Exercise: yes     Type of exercise: walking     Exercise (avg: min/session): 30 min.     Times/week: 3  Current Medications (verified): 1)  Metformin Hcl 1000 Mg Tabs (Metformin Hcl) .... Take 1 Tablet By Mouth Two Times A Day 2)  Anacin 81 Mg  Tbec (Aspirin) .... Take 1 Tablet By Mouth Once A Day 30 Minutes Before Niaspan 3)  Fish Oil 1000 Mg  Caps (Omega-3 Fatty Acids) .... Take One With Each Meal 4)  Simvastatin 40 Mg Tabs (Simvastatin) .... Take 1 Tablet By Mouth Once A Day 5)  Aldactone 50 Mg Tabs (Spironolactone) .... Take 1 Tablet By Mouth Two Times A Day 6)  Onetouch Ultra Test  Strp (Glucose Blood) .... Check Cbgs Three Times A Day or As Direrected. 7)  Onetouch .... Meter: Test As Directed 8)  Byetta 5 Mcg  Pen 5 Mcg/0.79ml Soln (Exenatide) .... Inject Two Times A Day As Directed 9)  Onetouch Delica Lancets  Misc (Lancets) .... Use As Directed 10)  Prodigy Mini Pen Needles 31g X 5 Mm Misc (Insulin Pen Needle) .... Use As Directed 11)  Diazepam 5 Mg Tabs (Diazepam) .... Take 1 Tablet By Mouth Every 12 Hours As Needed For Muscle Spasm 12)  Lortab 10-500 Mg Tabs (Hydrocodone-Acetaminophen) .... Take 1-2 Tabs By Mouth Every 6 Hours As Needed Pain 13)  Polyethylene Glycol 3350  Pack (Polyethylene Glycol 3350) .... Take One Dose Daily By Mouth As Needed For Constipation As Directed. 14)  Gabapentin 300 Mg Caps (Gabapentin) .... Start Taking One Tab By Mouth At Bedtime X 1 Day Then Increase To Twice Daily 15)  Voltaren 1 % Gel (Diclofenac Sodium) .... Apply To Both Shoulders Twice Daily As Needed For Pain  Allergies (verified): 1)  ! Morphine  Review of Systems      See HPI  Physical Exam  General:  alert and well-developed.  NAD.  Neck:  supple and no masses.   Lungs:  normal respiratory effort and normal breath sounds.   Heart:  normal rate, regular rhythm, and no murmur.   Abdomen:  soft and non-tender.   Msk:  bilateral  arms with significant reduction in strength. difficulty initiating abductiion on arm, guarding shoulder, wrist flexion and extention normal. Multiple tenderpoints. Deltoid is spasmed on both sides. Difficult to ilicit reflexes due to upper extremity spasm. Pain worse in d=right shoulder and lateral upper arm. Extremities:  no edema Neurologic:  alert & oriented X3 and cranial nerves II-XII intact.   Skin:  no rashes and no edema.   Psych:  Oriented X3, memory intact for recent and remote, normally interactive, good eye contact, and not anxious appearing.     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 Limited movement and ROM of both arms-localized to the upper  arm, deltoid region, but pain is radicular into the wrists and hands. Patient has never had this pain prior to her surgery a few weeks ago. Had immediate pain upon waking. Injections, pain meds and ice/heat have been minimally helpful. Difficulty doing almost all of her ADLs. She is tearful today and asking if this is going to get better. I am encouraged that tehre is some, although minor improvement. We need to get EMG/NCS to determine what nerve is involved and possibel an MRI. For the pain I have given her a refil on teh diazapam for muscle spasm, voltaren gel and will start neurontin short term.   Additionally, I spoke with Dr. Hermelinda Medicus with [facility] who also works with [facility] on the 4th floor Rehab unit. He will see her at 1030AM tomorrow for evaluation.   Problem # 2:  ABDOMINAL WALL PAIN (ICD-789.09) Resolved post hysterectomy Her updated medication list for this problem includes:    Anacin 81 Mg Tbec (Aspirin) .[name]... Take 1 tablet by mouth once a day 30 minutes before niaspan    Lortab 10-500 Mg Tabs (Hydrocodone-acetaminophen) .[name]... Take 1-2 tabs by mouth every 6 hours as needed pain  Complete Medication List: 1)  Metformin Hcl 1000 Mg Tabs (Metformin hcl) .... Take 1 tablet by mouth two times a day 2)  Anacin 81 Mg Tbec (Aspirin) .... Take 1 tablet by mouth once a day 30 minutes before niaspan 3)  Fish Oil 1000 Mg Caps (Omega-3 fatty acids) .... Take one with each meal 4)  Simvastatin 40 Mg Tabs (Simvastatin) .... Take 1 tablet by mouth once a day 5)  Aldactone 50 Mg Tabs (Spironolactone) .... Take 1 tablet by mouth two times a day 6)  Onetouch Ultra Test Strp (Glucose blood) .... Check cbgs three times a day or as direrected. 7)  Onetouch  .... Meter: test as directed 8)  Byetta 5 Mcg Pen 5 Mcg/0.25ml Soln (Exenatide) .... Inject two times a day as directed 9)  Onetouch Delica Lancets Misc (Lancets) .... Use as directed 10)  Prodigy Mini Pen Needles 31g X 5 Mm Misc (Insulin pen needle) ....  Use as directed 11)  Diazepam 5 Mg Tabs (Diazepam) .... Take 1 tablet by mouth every 12 hours as needed for muscle spasm 12)  Lortab 10-500 Mg Tabs (Hydrocodone-acetaminophen) .... Take 1-2 tabs by mouth every 6 hours as needed pain 13)  Polyethylene Glycol 3350 Pack (Polyethylene glycol 3350) .... Take one dose daily by mouth as needed for constipation as directed. 14)  Gabapentin 300 Mg Caps (Gabapentin) .... Start taking one tab by mouth at bedtime x 1 day then increase to twice daily 15)  Voltaren 1 % Gel (Diclofenac sodium) .... Apply to both shoulders twice daily as  needed for pain  Patient Instructions: 1)  Please schedule a follow-up appointment as needed. Prescriptions: VOLTAREN 1 % GEL (DICLOFENAC SODIUM) Apply to both shoulders twice daily as needed for pain  #1 tube x 2   Entered and Authorized by:   Julaine Fusi  DO   Signed by:   Julaine Fusi  DO on 03/17/2010   Method used:   Print then Give to Patient   RxID:   [id number] GABAPENTIN 300 MG CAPS (GABAPENTIN) Start taking one tab by mouth at bedtime X 1 day then increase to twice daily  #60 x 0   Entered and Authorized by:   Julaine Fusi  DO   Signed by:   Julaine Fusi  DO on 03/17/2010   Method used:   Print then Give to Patient   RxID:   [id number] DIAZEPAM 5 MG TABS (DIAZEPAM) Take 1 tablet by mouth every 12 hours as needed for muscle spasm  #30 x 1   Entered and Authorized by:   Julaine Fusi  DO   Signed by:   Julaine Fusi  DO on 03/17/2010   Method used:   Print then Give to Patient   RxID:   [id number]

## 2010-11-10 NOTE — Letter (Signed)
Summary: MED LINK  MED LINK   Imported By: Margie Billet 04/01/2010 10:59:56  _____________________________________________________________________  External Attachment:    Type:   Image     Comment:   External Document

## 2010-11-10 NOTE — Miscellaneous (Signed)
  Clinical Lists Changes  Medications: Removed medication of VICTOZA 18 MG/3ML SOLN (LIRAGLUTIDE) Inject daily as directed Added new medication of PRODIGY MINI PEN NEEDLES 31G X 5 MM MISC (INSULIN PEN NEEDLE) Use as directed - Signed Rx of PRODIGY MINI PEN NEEDLES 31G X 5 MM MISC (INSULIN PEN NEEDLE) Use as directed;  #100 x 6;  Signed;  Entered by: Julaine Fusi  DO;  Authorized by: Julaine Fusi  DO;  Method used: Electronically to [facility]*, [address] Shipping/mailing, [place], [place]  [address], Ph: [phone], Fax: [phone]    Prescriptions: PRODIGY MINI PEN NEEDLES 31G X 5 MM MISC (INSULIN PEN NEEDLE) Use as directed  #100 x 6   Entered and Authorized by:   Julaine Fusi  DO   Signed by:   Julaine Fusi  DO on 11/26/2009   Method used:   Electronically to        [facility]* (retail)       [address]       [address] Shipping/mailing       [place], [place]  [address]       Ph: [phone]       Fax: [phone]   RxID:   [id number]

## 2010-11-10 NOTE — Assessment & Plan Note (Signed)
Summary: F/U/EST/VS   Vital Signs:  Patient profile:   35 year old female Height:      66 inches Weight:      275.01 pounds BMI:     44.55 Temp:     98.8 degrees F oral Pulse rate:   86 / minute BP sitting:   114 / 75  Vitals Entered By: Angelina Ok RN (December 30, 2009 3:22 PM) Is Patient Diabetic? Yes Did you bring your meter with you today? Yes Pain Assessment Patient in pain? yes     Location: abdomen, ovaries Intensity: 7 Type: aching Onset of pain  Constant Nutritional Status BMI of > 30 = obese CBG Result 174  Have you ever been in a relationship where you felt threatened, hurt or afraid?No   Does patient need assistance? Functional Status Self care Ambulation Normal Comments Went to the GYN.  Check up.  Needs letter to Dr. Wonda Olds   History of Present Illness: Renee Ho comes in today for routine follow-up on her Diabetes Mellitus Type 2 and concerns over her PCOS. She saw Dr. Cherly Hensen last week and they are initiating a work-up for surgical management of her PCOS and Dysmenorrhea. Leann respots that she has lost weight and that she has been 100% compliant with her DM regimen. No lows. Felling like her energy level is better.  Depression History:      The patient denies a depressed mood most of the day and a diminished interest in her usual daily activities.        The patient denies that she feels like life is not worth living, denies that she wishes that she were dead, and denies that she has thought about ending her life.         Preventive Screening-Counseling & Management  Alcohol-Tobacco     Alcohol type: very rarely     Smoking Status: quit     Year Quit: 2-4 years ago  Current Medications (verified): 1)  Metformin Hcl 1000 Mg Tabs (Metformin Hcl) .... Take 1 Tablet By Mouth Two Times A Day 2)  Anacin 81 Mg  Tbec (Aspirin) .... Take 1 Tablet By Mouth Once A Day 30 Minutes Before Niaspan 3)  Fish Oil 1000 Mg  Caps (Omega-3 Fatty Acids) .... Take One  With Each Meal 4)  Simvastatin 40 Mg Tabs (Simvastatin) .... Take 1 Tablet By Mouth Once A Day 5)  Aldactone 50 Mg Tabs (Spironolactone) .... Take 1 Tablet By Mouth Two Times A Day 6)  Onetouch Ultra Test  Strp (Glucose Blood) .... Check Cbgs Three Times A Day or As Direrected. 7)  Onetouch .... Meter: Test As Directed 8)  Byetta 5 Mcg Pen 5 Mcg/0.38ml Soln (Exenatide) .... Inject Two Times A Day As Directed 9)  Onetouch Delica Lancets  Misc (Lancets) .... Use As Directed 10)  Prodigy Mini Pen Needles 31g X 5 Mm Misc (Insulin Pen Needle) .... Use As Directed  Allergies (verified): 1)  ! 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 Married. 2 children  Review of Systems      See HPI  Physical Exam  General:  alert.  obese,well-hydrated and appropriate dress.   Head:  normocephalic.   Eyes:  pupils equal, pupils round, and pupils reactive to light.   Mouth:  pharynx pink and moist.   Neck:  supple.   Lungs:  normal breath sounds, no crackles, and no wheezes.   Heart:  normal rate, regular rhythm, no murmur,  and no rub.   Abdomen:  soft and non-tender.  central adiposity. Msk:  normal ROM and no joint tenderness.   Skin:  hirsutism, mild acne Psych:  Oriented X3 and normally interactive.     Impression & Recommendations:  Problem # 1:  POLYCYSTIC OVARIAN DISEASE (ICD-256.4) Dr. Cherly Hensen is managing her GYN component. Associated with dysmenorrhea. Likely she will have a hysterectomy- possible oophorectomy.  Problem # 2:  DIABETES MELLITUS, TYPE II (ICD-250.00) Continue current regimen. A1C is ecellent- coming down very well.  Her updated medication list for this problem includes:    Metformin Hcl 1000 Mg Tabs (Metformin hcl) .[name]... Take 1 tablet by mouth two times a day    Anacin 81 Mg Tbec (Aspirin) .[name]... Take 1 tablet by mouth once a day 30 minutes before niaspan    Byetta 5 Mcg Pen 5 Mcg/0.41ml Soln (Exenatide) ..... Inject two times a day as  directed  Labs Reviewed: Creat: 0.75 (11/06/2009)     Last Eye Exam: No diabetic retinopathy.    (08/21/2009) Reviewed HgBA1c results: 8.3 (10/16/2009)  8.5 (07/23/2009)  Orders: T-Hgb A1C (in-house) ([id number])  Labs Reviewed: Creat: 0.75 (11/06/2009)     Last Eye Exam: No diabetic retinopathy.    (08/21/2009) Reviewed HgBA1c results: 7.3 (12/30/2009)  8.3 (10/16/2009)  Problem # 3:  SLEEP APNEA (ICD-780.57) Need to get records from sleep lab. Will likely need CPAP at night at some point.  Problem # 4:  HYPERTENSION (ICD-401.9) Spironalactone for anti androgen and BP control.  Her updated medication list for this problem includes:    Aldactone 50 Mg Tabs (Spironolactone) .[name]... Take 1 tablet by mouth two times a day  Complete Medication List: 1)  Metformin Hcl 1000 Mg Tabs (Metformin hcl) .... Take 1 tablet by mouth two times a day 2)  Anacin 81 Mg Tbec (Aspirin) .... Take 1 tablet by mouth once a day 30 minutes before niaspan 3)  Fish Oil 1000 Mg Caps (Omega-3 fatty acids) .... Take one with each meal 4)  Simvastatin 40 Mg Tabs (Simvastatin) .... Take 1 tablet by mouth once a day 5)  Aldactone 50 Mg Tabs (Spironolactone) .... Take 1 tablet by mouth two times a day 6)  Onetouch Ultra Test Strp (Glucose blood) .... Check cbgs three times a day or as direrected. 7)  Onetouch  .... Meter: test as directed 8)  Byetta 5 Mcg Pen 5 Mcg/0.46ml Soln (Exenatide) .... Inject two times a day as directed 9)  Onetouch Delica Lancets Misc (Lancets) .... Use as directed 10)  Prodigy Mini Pen Needles 31g X 5 Mm Misc (Insulin pen needle) .... Use as directed  Other Orders: T-Comprehensive Metabolic Panel [id number]) T-CBC w/Diff ([id number]) T-Lipid Profile ([id number])  Patient Instructions: 1)  Please schedule a follow-up appointment in 2 months. 2)  Pager: [phone]  Prevention & Chronic Care Immunizations   Influenza vaccine: Not documented    Tetanus booster: Not  documented    Pneumococcal vaccine: Not documented  Other Screening   Pap smear: Not documented   Smoking status: quit  (12/30/2009)  Diabetes Mellitus   HgbA1C: 7.3  (12/30/2009)    Eye exam: No diabetic retinopathy.     (08/21/2009)   Eye exam due: 08/2010    Foot exam: yes  (02/27/2007)   High risk foot: Not documented   Foot care education: Not documented    Urine microalbumin/creatinine ratio: 19.2  (07/23/2009)  Lipids   Total Cholesterol: 202  (07/23/2009)   LDL: 115  (07/23/2009)  LDL Direct: Not documented   HDL: 31  (07/23/2009)   Triglycerides: 282  (07/23/2009)    SGOT (AST): 22  (11/06/2009)   SGPT (ALT): 16  (11/06/2009) CMP ordered    Alkaline phosphatase: 52  (11/06/2009)   Total bilirubin: 0.8  (11/06/2009)  Hypertension   Last Blood Pressure: 114 / 75  (12/30/2009)   Serum creatinine: 0.75  (11/06/2009)   Serum potassium 4.1  (11/06/2009) CMP ordered   Self-Management Support :    Patient will work on the following items until the next clinic visit to reach self-care goals:     Medications and monitoring: take my medicines every day, check my blood sugar, bring all of my medications to every visit, examine my feet every day  (12/30/2009)     Eating: drink diet soda or water instead of juice or soda, eat more vegetables, use fresh or frozen vegetables, eat foods that are low in salt, eat baked foods instead of fried foods, eat fruit for snacks and desserts, limit or avoid alcohol  (12/30/2009)     Activity: take a 30 minute walk every day, take the stairs instead of the elevator  (12/30/2009)    Diabetes self-management support: Written self-care plan, Education handout  (12/30/2009)   Diabetes care plan printed   Diabetes education handout printed   Last diabetes self-management training by diabetes educator: 11/13/2009   Last medical nutrition therapy: 02/01/2008    Hypertension self-management support: Written self-care plan, Education  handout  (12/30/2009)   Hypertension self-care plan printed.   Hypertension education handout printed    Lipid self-management support: Written self-care plan, Education handout  (12/30/2009)   Lipid self-care plan printed.   Lipid education handout printed  Process Orders Check Orders Results:     [facility]: ABN not required for this insurance Tests Sent for requisitioning (January 09, 2010 6:43 PM):     12/30/2009: [facility] -- T-Comprehensive Metabolic Panel [[id number]] (signed)     12/30/2009: [facility] -- T-CBC w/Diff [[id number]] (signed)     12/30/2009: [facility] -- T-Lipid Profile [id number] (signed)     Vital Signs:  Patient profile:   35 year old female Height:      66 inches Weight:      275.01 pounds BMI:     44.55 Temp:     98.8 degrees F oral Pulse rate:   86 / minute BP sitting:   114 / 75  Vitals Entered By: Angelina Ok RN (December 30, 2009 3:22 PM)     Laboratory Results   Blood Tests   Date/Time Received: December 30, 2009 4:34 PM  Date/Time Reported: Burke Keels  December 30, 2009 4:35 PM   HGBA1C: 7.3%   (Normal Range: Non-Diabetic - 3-6%   Control Diabetic - 6-8%) CBG Random:: 174mg /dL

## 2010-11-10 NOTE — Assessment & Plan Note (Signed)
Summary: Teach meter testing   Allergies: 1)  ! Morphine   Complete Medication List: 1)  Metformin Hcl 1000 Mg Tabs (Metformin hcl) .... Take 1 tablet by mouth two times a day 2)  Anacin 81 Mg Tbec (Aspirin) .... Take 1 tablet by mouth once a day 30 minutes before niaspan 3)  Fish Oil 1000 Mg Caps (Omega-3 fatty acids) .... Take one with each meal 4)  Simvastatin 40 Mg Tabs (Simvastatin) .... Take 1 tablet by mouth once a day 5)  Aldactone 50 Mg Tabs (Spironolactone) .... Take 1 tablet by mouth two times a day  Other Orders: DSMT(Medicare) Individual, 30 Minutes (G1829)  Diabetes Self Management Training  PCP: Julaine Fusi  DO Referring MD: golding Date diagnosed with diabetes: 12/10/2003 Diabetes Type: Type 2 non-insulin treated Other persons present: no Current smoking Status: quit  Assessment Work Hours: Full Time Type of Work: CNA here at [employer] needs or Barriers: PCOS- is hoping pending TAH will solve.improve her diabetes, sleep apnea, etc Affect: Anxious  Diabetes Medications:  Lipid lowering Meds? Yes Anti-platelet Meds? Yes Herbs or Supplements: Yes Comments: gave one touch ultra mini meter and showed pateint how to use- able to use after 3 attemps- CBg 200 4 hours post meal. Discussed benefits costs and options of Byetta, victoza, oralmedications for diabetes. patient desires to try Byetta, successfully ave herself and injection of aline wiht syringe. have requested prescriptions from doctor.    Monitoring Self monitoring blood glucose 2 times a day Name of Meter  One Touch Ultramini  Recent Episodes of: Requiring Help from another person  Hyperglycemia : Yes Hypoglycemia: No Severe Hypoglycemia : No     Diabetes Disease Process  Discussed today  Medications State name-action-dose-duration-side effects-and time to take medication: Demonstrates competency   State appropriate timing of food related to medication: Demonstrates competency     Demonstrates/verbalizes site selection and rotation for injections Demonstrates competency   Correctly draw up and administer insulin-Byetta-Symlin-glucagon: Demonstrates competency    Nutritional Management  Monitoring Perform glucose monitoring/ketone testing and record results correctly: Demonstrates competency    State target blood glucose and HgbA1C goals: Needs review/assistance   Diabetes Management Education Done: 11/13/2009    BEHAVIORAL GOALS INITIAL Utilizing medications if for therapeutic effectiveness: start Byetta twice daily as soon as you get prescription filled Monitoring blood glucose levels daily: bring meter to visits        sent patient paperwork for Link to wellness employee diabetes program. Diabetes Self Management Support: clinic staff/doctor appointments Follow-up:monthly when signed up for Link ot wellness   Appended Document: Teach meter testing    Clinical Lists Changes  Medications: Added new medication of ONETOUCH DELICA LANCETS  MISC (LANCETS) use as directed - Signed Rx of ONETOUCH DELICA LANCETS  MISC (LANCETS) use as directed;  #100 x 3;  Signed;  Entered by: Julaine Fusi  DO;  Authorized by: Julaine Fusi  DO;  Method used: Handwritten    Prescriptions: ONETOUCH DELICA LANCETS  MISC (LANCETS) use as directed  #100 x 3   Entered and Authorized by:   Julaine Fusi  DO   Signed by:   Julaine Fusi  DO on 11/23/2009   Method used:   Handwritten   RxID:   [id number]

## 2010-11-12 NOTE — Progress Notes (Signed)
Summary: PAP Mammo result information  Phone Note Outgoing Call   Summary of Call: PAP  new result information found per [facility]   12-24-2009  Mammo- No information found  Alric Quan  November 02, 2010 12:27 PM'

## 2010-11-12 NOTE — Progress Notes (Signed)
Summary: cbg/ hla  Phone Note Other Incoming   Summary of Call: pt's charge nurse on [employer] at [employer] where pt works calls to say pt's cbg is 323, pt has taken her metformin and byetta, pt is weak, dizzy to the point of being assisted to a lying position and does not feel well enough to get up, she desires to be seen in clinic or given permission to take more metformin. it is advised that pt be taken to ED for evaluation by the int med team, chg nurse and pt object, i spoke w/ dr Meredith Pel and dr Phillips Odor. chg nurse is advised per dr Phillips Odor that ED is best for pt at this time. chg nurse states she will tell pt. Initial call taken by: Marin Roberts RN,  October 01, 2010 3:19 PM

## 2010-11-12 NOTE — Consult Note (Signed)
Summary: SLEEP MEDICINE  SLEEP MEDICINE   Imported By: Margie Billet 10/12/2010 13:37:27  _____________________________________________________________________  External Attachment:    Type:   Image     Comment:   External Document

## 2010-11-19 ENCOUNTER — Ambulatory Visit: Payer: Self-pay

## 2010-11-19 ENCOUNTER — Other Ambulatory Visit: Payer: Self-pay | Admitting: Occupational Medicine

## 2010-11-19 DIAGNOSIS — R52 Pain, unspecified: Secondary | ICD-10-CM

## 2010-11-27 ENCOUNTER — Telehealth: Payer: Self-pay | Admitting: *Deleted

## 2010-11-27 NOTE — Telephone Encounter (Signed)
Call from person at [facility] said that pt is in need of a new CPAP Machine.  Has not been sleeping well.  New order needs to be sent to [facility].  Pt has had a recent Sleep Study.  Original Machine is now 37 years old.

## 2010-11-27 NOTE — Telephone Encounter (Signed)
Please call [facility] and see what they need-ok to give verbal- she needs the machine ASAP-titration study has been done and is complete.

## 2010-11-30 NOTE — Telephone Encounter (Signed)
Referral was completed and given to Derrian.  Office person said that a verbal could not be done.

## 2010-12-01 ENCOUNTER — Other Ambulatory Visit: Payer: Self-pay | Admitting: Internal Medicine

## 2010-12-01 DIAGNOSIS — G4733 Obstructive sleep apnea (adult) (pediatric): Secondary | ICD-10-CM

## 2010-12-01 NOTE — Progress Notes (Signed)
CPAP orders given to [facility] on written script

## 2010-12-03 ENCOUNTER — Encounter: Payer: Self-pay | Admitting: Internal Medicine

## 2010-12-03 ENCOUNTER — Ambulatory Visit (INDEPENDENT_AMBULATORY_CARE_PROVIDER_SITE_OTHER): Payer: Commercial Managed Care - PPO | Admitting: Internal Medicine

## 2010-12-03 DIAGNOSIS — E785 Hyperlipidemia, unspecified: Secondary | ICD-10-CM

## 2010-12-03 DIAGNOSIS — E119 Type 2 diabetes mellitus without complications: Secondary | ICD-10-CM

## 2010-12-03 DIAGNOSIS — H6690 Otitis media, unspecified, unspecified ear: Secondary | ICD-10-CM

## 2010-12-03 DIAGNOSIS — M722 Plantar fascial fibromatosis: Secondary | ICD-10-CM

## 2010-12-03 MED ORDER — ROSUVASTATIN CALCIUM 10 MG PO TABS: 20.0000 mg | ORAL_TABLET | Freq: Every day | ORAL | Status: DC

## 2010-12-03 MED ORDER — SPIRONOLACTONE 50 MG PO TABS: 50.0000 mg | ORAL_TABLET | Freq: Two times a day (BID) | ORAL | Status: AC

## 2010-12-03 MED ORDER — EXENATIDE 5 MCG/0.02ML ~~LOC~~ SOPN: 5.0000 ug | PEN_INJECTOR | Freq: Two times a day (BID) | SUBCUTANEOUS | Status: DC

## 2010-12-03 MED ORDER — OMEGA-3-ACID ETHYL ESTERS 1 G PO CAPS: 2.0000 g | ORAL_CAPSULE | Freq: Two times a day (BID) | ORAL | Status: DC

## 2010-12-03 MED ORDER — AMOXICILLIN 500 MG PO CAPS: 500.0000 mg | ORAL_CAPSULE | Freq: Two times a day (BID) | ORAL | Status: AC

## 2010-12-04 ENCOUNTER — Other Ambulatory Visit: Payer: Self-pay | Admitting: *Deleted

## 2010-12-04 MED ORDER — EXENATIDE 5 MCG/0.02ML ~~LOC~~ SOPN: 5.0000 ug | PEN_INJECTOR | Freq: Two times a day (BID) | SUBCUTANEOUS | Status: DC

## 2010-12-04 NOTE — Telephone Encounter (Signed)
Pharmacy said that the dosage prescribed is 5 times the dosage that the pen is designed to deliver.

## 2010-12-21 LAB — POCT I-STAT, CHEM 8
BUN: 9 mg/dL (ref 6–23)
Calcium, Ion: 1.12 mmol/L (ref 1.12–1.32)
Creatinine, Ser: 0.7 mg/dL (ref 0.4–1.2)
Glucose, Bld: 178 mg/dL — ABNORMAL HIGH (ref 70–99)
TCO2: 26 mmol/L (ref 0–100)

## 2010-12-21 LAB — POCT URINALYSIS DIPSTICK
Bilirubin Urine: NEGATIVE
Hgb urine dipstick: NEGATIVE
Nitrite: NEGATIVE
Protein, ur: NEGATIVE mg/dL
Urobilinogen, UA: 0.2 mg/dL (ref 0.0–1.0)
pH: 5 (ref 5.0–8.0)

## 2010-12-28 LAB — BASIC METABOLIC PANEL
BUN: 15 mg/dL (ref 6–23)
BUN: 8 mg/dL (ref 6–23)
CO2: 27 mEq/L (ref 19–32)
Calcium: 8.4 mg/dL (ref 8.4–10.5)
Chloride: 102 mEq/L (ref 96–112)
Chloride: 103 mEq/L (ref 96–112)
Creatinine, Ser: 0.72 mg/dL (ref 0.4–1.2)
Creatinine, Ser: 0.8 mg/dL (ref 0.4–1.2)
GFR calc non Af Amer: >60 mL/min (ref >60)
Glucose, Bld: 219 mg/dL — ABNORMAL HIGH (ref 70–99)

## 2010-12-28 LAB — CBC
MCHC: 34.6 g/dL (ref 30.0–36.0)
MCV: 89.7 fL (ref 78.0–100.0)
MCV: 90.9 fL (ref 78.0–100.0)
Platelets: 163 10*3/uL (ref 150–400)
Platelets: 216 10*3/uL (ref 150–400)
RBC: 4.6 MIL/uL (ref 3.87–5.11)
RDW: 13.3 % (ref 11.5–15.5)
WBC: 12.2 10*3/uL — ABNORMAL HIGH (ref 4.0–10.5)

## 2010-12-28 LAB — MRSA PCR SCREENING: MRSA by PCR: NEGATIVE

## 2010-12-28 LAB — PREGNANCY, URINE: Preg Test, Ur: NEGATIVE

## 2010-12-30 LAB — DIFFERENTIAL
Basophils Absolute: 0 10*3/uL (ref 0.0–0.1)
Lymphocytes Relative: 45 % (ref 12–46)
Monocytes Absolute: 0.4 10*3/uL (ref 0.1–1.0)
Neutro Abs: 2.6 10*3/uL (ref 1.7–7.7)

## 2010-12-30 LAB — POCT I-STAT, CHEM 8
BUN: 12 mg/dL (ref 6–23)
Calcium, Ion: 1.11 mmol/L — ABNORMAL LOW (ref 1.12–1.32)
Chloride: 110 mEq/L (ref 96–112)
Sodium: 143 mEq/L (ref 135–145)

## 2010-12-30 LAB — CBC
Hemoglobin: 14.6 g/dL (ref 12.0–15.0)
RDW: 13 % (ref 11.5–15.5)
WBC: 5.7 10*3/uL (ref 4.0–10.5)

## 2010-12-30 LAB — GLUCOSE, CAPILLARY: Glucose-Capillary: 201 mg/dL — ABNORMAL HIGH (ref 70–99)

## 2011-01-01 LAB — GLUCOSE, CAPILLARY: Glucose-Capillary: 174 mg/dL — ABNORMAL HIGH (ref 70–99)

## 2011-01-14 LAB — GLUCOSE, CAPILLARY: Glucose-Capillary: 241 mg/dL — ABNORMAL HIGH (ref 70–99)

## 2011-01-14 LAB — HEPATIC FUNCTION PANEL
AST: 33 U/L (ref 0–37)
Bilirubin, Direct: 0.2 mg/dL (ref 0.0–0.3)

## 2011-01-14 LAB — URINALYSIS, ROUTINE W REFLEX MICROSCOPIC
Leukocytes, UA: NEGATIVE
Nitrite: NEGATIVE
Specific Gravity, Urine: 1.044 — ABNORMAL HIGH (ref 1.005–1.030)
Urobilinogen, UA: 0.2 mg/dL (ref 0.0–1.0)

## 2011-01-14 LAB — POCT I-STAT, CHEM 8
Glucose, Bld: 249 mg/dL — ABNORMAL HIGH (ref 70–99)
HCT: 50 % — ABNORMAL HIGH (ref 36.0–46.0)
Hemoglobin: 17 g/dL — ABNORMAL HIGH (ref 12.0–15.0)
Potassium: 4.3 mEq/L (ref 3.5–5.1)
Sodium: 138 mEq/L (ref 135–145)
TCO2: 29 mmol/L (ref 0–100)

## 2011-01-14 LAB — URINE MICROSCOPIC-ADD ON

## 2011-01-14 LAB — CBC
MCHC: 34.4 g/dL (ref 30.0–36.0)
RDW: 13.6 % (ref 11.5–15.5)

## 2011-01-14 LAB — DIFFERENTIAL
Basophils Absolute: 0 10*3/uL (ref 0.0–0.1)
Basophils Relative: 1 % (ref 0–1)
Neutro Abs: 4.6 10*3/uL (ref 1.7–7.7)
Neutrophils Relative %: 60 % (ref 43–77)

## 2011-02-03 ENCOUNTER — Other Ambulatory Visit: Payer: Self-pay | Admitting: Internal Medicine

## 2011-02-03 NOTE — Progress Notes (Signed)
Will have patient come in for lab only appointment. Results need to be faxed to [facility].

## 2011-02-05 ENCOUNTER — Other Ambulatory Visit: Payer: Self-pay | Admitting: Internal Medicine

## 2011-02-05 ENCOUNTER — Other Ambulatory Visit (INDEPENDENT_AMBULATORY_CARE_PROVIDER_SITE_OTHER): Payer: Commercial Managed Care - PPO

## 2011-02-05 DIAGNOSIS — E119 Type 2 diabetes mellitus without complications: Secondary | ICD-10-CM

## 2011-02-05 LAB — POCT GLYCOSYLATED HEMOGLOBIN (HGB A1C): Hemoglobin A1C: 8.4

## 2011-02-06 LAB — LIPID PANEL
Cholesterol: 198 mg/dL (ref 0–200)
Triglycerides: 905 mg/dL — ABNORMAL HIGH (ref <150)

## 2011-02-06 LAB — MICROALBUMIN / CREATININE URINE RATIO
Creatinine, Urine: 164.2 mg/dL
Microalb Creat Ratio: 8.6 mg/g (ref 0.0–30.0)
Microalb, Ur: 1.41 mg/dL (ref 0.00–1.89)

## 2011-02-06 LAB — COMPREHENSIVE METABOLIC PANEL
AST: 17 U/L (ref 0–37)
Albumin: 4.3 g/dL (ref 3.5–5.2)
BUN: 15 mg/dL (ref 6–23)
CO2: 25 mEq/L (ref 19–32)
Calcium: 9.3 mg/dL (ref 8.4–10.5)
Chloride: 102 mEq/L (ref 96–112)
Creat: 0.76 mg/dL (ref 0.40–1.20)
Glucose, Bld: 205 mg/dL — ABNORMAL HIGH (ref 70–99)

## 2011-02-15 ENCOUNTER — Other Ambulatory Visit: Payer: Self-pay | Admitting: Internal Medicine

## 2011-02-15 DIAGNOSIS — E785 Hyperlipidemia, unspecified: Secondary | ICD-10-CM

## 2011-02-15 MED ORDER — ROSUVASTATIN CALCIUM 10 MG PO TABS: 10.0000 mg | ORAL_TABLET | Freq: Every day | ORAL | Status: DC

## 2011-02-15 MED ORDER — GEMFIBROZIL 600 MG PO TABS: 600.0000 mg | ORAL_TABLET | Freq: Two times a day (BID) | ORAL | Status: AC

## 2011-02-15 MED ORDER — EXENATIDE 5 MCG/0.02ML ~~LOC~~ SOPN: 10.0000 ug | PEN_INJECTOR | Freq: Two times a day (BID) | SUBCUTANEOUS | Status: DC

## 2011-02-23 ENCOUNTER — Telehealth: Payer: Self-pay | Admitting: *Deleted

## 2011-02-23 NOTE — Discharge Summary (Signed)
[name], Renee               ACCOUNT NO.:  [id number]   MEDICAL RECORD NO.:  [id number]          PATIENT TYPE:  INP   LOCATION:  3711                         FACILITY:  [facility]   PHYSICIAN:  Renee Ho, M.D.    DATE OF BIRTH:  June 05, 1976   DATE OF ADMISSION:  12/19/2007  DATE OF DISCHARGE:  12/20/2007                               DISCHARGE SUMMARY   DISCHARGE DIAGNOSES:  1. Progressive dyspnea secondary to diastolic dysfunction with      probable restrictive component secondary to morbid obesity.  2. Hypertension.  3. Type 2 diabetes mellitus.  4. Hypertriglyceridemia.  5. Obstructive sleep apnea, on CPAP.  6. Polycystic ovary syndrome.  7. Morbid obesity.   DISCHARGE MEDICATIONS:  1. Metformin 850 mg p.o. b.i.d.  2. Lopid 600 mg p.o. b.i.d.  3. Lisinopril 20 mg p.o. daily.   DISPOSITION:  Patient was discharged in hemodynamically stable condition  after any life-threatening causes of shortness of breath had been ruled  out.  She has been scheduled to follow up with Dr. Anderson Malta in  the outpatient clinic at [facility] on April 8th at 2 p.m.  At that  time, please monitor the patient's blood pressure control and diabetes  control.  She will need a hemoglobin A1C as well as a BMET since she was  restarted on an ACE inhibitor.  In about six weeks time, she will also  need a CMET and a fasting lipid profile to monitor her liver function  tests.  Otherwise, the patient has been referred to [facility] for full pulmonary function tests that will be done on March 16th  at noon.   CONSULTATIONS:  None.   PROCEDURES:  A chest x-ray on March 10th showed no acute findings.   ADMISSION HISTORY:  Renee Ho is a 35 year old woman with morbid  obesity related problems who woke up on the morning of admission with  sudden onset of dyspnea associated with chest heaviness and heart  palpitations.  She also noticed that she was orthopneic and was unable  to  lay flat after waking up.  She went to work despite feeling short of  breath and noticed that she was more short of breath than usual with  exertion.  At baseline, she does not usually have any dyspnea.  Her  discomfort persisted throughout the day.  The patient does not have a  history of prior DVT or pulmonary embolism.  She denied any recent  surgery, travel, or lower extremity symptoms.  She also denied any  cough, fever, or hemoptysis.   ADMISSION PHYSICAL:  Temperature 97.8, blood pressure 145/100, heart  rate 86, respiratory rate 20, O2 sat 95% on room air.  GENERAL:  An obese young woman in no acute distress.  HEENT:  EOMI.  PERRLA.  Anicteric sclerae.  Moist mucous membranes.  NECK:  Obese.  Difficult to assess for JVD.  RESPIRATORY:  Lungs clear to auscultation bilaterally with fair air  movement.  CARDIOVASCULAR:  Regular rate and rhythm.  No murmur or gallop  appreciated.  GI:  Bowel sounds positive.  ABDOMEN:  Soft, nontender, nondistended but obese.  EXTREMITIES:  No calf tenderness, edema, or erythema.  NEURO:  Grossly nonfocal.   ADMISSION LABS:  Sodium 134, potassium 4.1, chloride 109, bicarb 23.8,  BUN 15, creatinine 0.9, glucose 210.  Point-of-care cardiac enzymes  negative.  D-dimer is 0.6.   HOSPITAL COURSE:  1. Progressive shortness of breath:  Given her presenting symptoms and      elevated D-dimer, Renee Ho was sent for a chest CT angiography,      which was negative for pulmonary embolism or other abnormalities.      She was ruled out for an acute coronary syndrome with cycled      cardiac enzymes and EKGs.  Her chest x-ray did not reveal any      abnormalities such as a pneumonia, aortic dissection, or      pneumothorax.  We did consider that her obstructive sleep apnea      could be contributing to her dyspnea, but we still sent her for a      2D echo to evaluate for possible diastolic dysfunction.  As      expected, the 2D echo showed a normal  ejection fraction of 60-65%,      but the LV wall thickness was mildly increased, and the features      were consistent with moderate diastolic dysfunction.  This is a new      diagnosis for her, and in order to prevent further heart      remodeling, we decided to start her on an ACE inhibitor.  She had      been on lisinopril in the past, but it was discontinued of her      blood pressure went too low.  At that time, she was also on HCTZ.      I explained what diastolic dysfunction is to the patient and      related the importance of blood pressure as well as risk factor      control.  We also arranged for Renee Ho to have an outpatient      full pulmonary function test to assess whether she has some      obstructive or restrictive lung disease.  We strongly suspect that      she has a restrictive component, given her morbid obesity.   1. Hypertension:  Renee Ho was continued on HCTZ during her      hospital course, and her blood pressure was reasonably well      controlled.  As previously mentioned, though, we have decided to      take her off the HCTZ and only have her on an ACE inhibitor to      prevent remottling in the setting of her newly diagnosed diastolic      dysfunction.  Patient will follow up in the outpatient clinic for a      BMET to monitor her potassium and renal function and her blood      pressure control as well.   1. Type 2 diabetes mellitus:  An A1C was not checked during this      admission, but Renee Ho' CBGs were in the 200-300 range.  She      was only on 500 of Metformin b.i.d. as an outpatient and was placed      on Lantus with the sliding scale during this hospital course, in      case she would need any IV contrast medium.  I  have increased her      Metformin to 850 mg p.o. b.i.d. to be started upon discharge.  She      does recall being on Metformin t.i.d. in the past, which led to      bowel frequency, which is why she went back down to twice  daily.      She does not recall being on a higher dose than 500 mg.   1. Hypertriglyceridemia:  A fasting lipid profile was checked on Ms.      Ho while she was hospitalized.  Her total cholesterol was 215,      and her triglycerides were above 400, which made her LDL impossible      to calculate.  I have discussed with Renee Ho the importance of      lifestyle modification and also wrote her a prescription for Lopid      since it has been cheapest of the triglyceride-lowering agents.      She is not certain whether she will be able to afford it, but she      will certainly try.  She will need a follow-up fasting lipid and      LFTs in six weeks.  I explained that such elevated triglycerides      increased her risk of pancreatitis in addition to coronary artery      disease and stroke.   1. Obstructive sleep apnea:  The patient was kept on her CPAP      overnight.  She endorsed this outpatient compliance with her CPAP      machine.   1. Polycystic ovarian syndrome:  This can certainly be addressed on an      outpatient basis but it strengthens the importance of diabetes      control.   1. Morbid obesity:  I discussed weight loss with Renee Ho, and she      does not have a particular plan to try to lose weight.  She can be      counseled on weight loss in our outpatient clinic, and one might      even consider referring her for bariatric surgery, given the young      age and multiple medical problems secondary to obesity.      Renee Ho, M.D.  Electronically Signed      Renee Ho, M.D.  Electronically Signed    MC/MEDQ  D:  12/20/2007  T:  12/20/2007  Job:  [id number]

## 2011-02-23 NOTE — Procedures (Signed)
Renee Ho, [name]               ACCOUNT NO.:  [id number]   MEDICAL RECORD NO.:  [id number]          PATIENT TYPE:  OUT   LOCATION:  SLEEP CENTER                 FACILITY:  [facility]   PHYSICIAN:  Clinton D. Maple Hudson, MD, FCCP, FACPDATE OF BIRTH:  04-[birth year]   DATE OF STUDY:  01/29/2008                            NOCTURNAL POLYSOMNOGRAM   REFERRING PHYSICIAN:  Edsel Petrin, D.O.   REFERRING PHYSICIAN:  Dr. Julaine Fusi   INDICATION FOR STUDY:  Hypersomnia with sleep apnea.   EPWORTH SLEEPINESS SCORE:  12/24.  BMI 46.6, weight 280 pounds, height  65 inches, neck 21.5 inches.   HOME MEDICATION:  Charted and reviewed.   A baseline study on August 30, 2004, recorded an AHI of 178.5.  CPAP  at that time was titrated to 11 CWP for an AHI of 5.8 per hour.  CPAP  titration is now requested.   SLEEP ARCHITECTURE:  Total sleep time 354 minutes with sleep efficiency  90.2%.  Stage 1 was 3.8%, stage 2 72.6%, stage 3 3.2%, REM 20.3%.  Sleep  latency 15 minutes, REM latency 81 minutes, awake after sleep onset 23.5  minutes, arousal index 16.4.  Bedtime medication included aspirin and  niacin.   RESPIRATORY DATA:  CPAP titration protocol.  CPAP was titrated from 6  CWP to a final pressure of 13 CWP, AHI zero per hour.  She chose a  medium ResMed Quattro mask with heated humidifier.   OXYGEN DATA:  Mild to moderate snoring before CPAP control.  Mean oxygen  saturation was held at 93.9% on CPAP with room air.   CARDIAC DATA:  Normal sinus rhythm.   MOVEMENT/PARASOMNIA:  No significant movement disturbance.  Bathroom x1.   IMPRESSION/RECOMMENDATION:  1. Successful continuous positive airway pressure titration to 13 CWP,      apnea/hypopnea index zero per hour.  She chose a medium ResMed      Quattro full-face mask with heated humidifier.  2. Baseline diagnostic study on August 30, 2004, had recorded an      apnea/hypopnea index of 178.5 per hour      with continuous positive airway  pressure titration then to 11 CWP      for an apnea/hypopnea index of 5.8 per hour.      Clinton D. Maple Hudson, MD, [facility], FACP  Diplomate, Biomedical engineer of Sleep Medicine  Electronically Signed     CDY/MEDQ  D:  02/03/2008 08:30:14  T:  02/03/2008 08:43:28  Job:  [id number]

## 2011-02-23 NOTE — Telephone Encounter (Signed)
Call from nurse case manager with [facility]. She sees pt for link to wellness program for DM. She would like results of A1C Fax to [phone]  Att:  Santina Evans.  This has been done.

## 2011-02-26 NOTE — Consult Note (Signed)
Renee Ho, Renee Ho               ACCOUNT NO.:  [id number]   MEDICAL RECORD NO.:  [id number]          PATIENT TYPE:  INP   LOCATION:  9373                          FACILITY:  [facility]   PHYSICIAN:  Alfonse Alpers. Gegick, M.D.DATE OF BIRTH:  [birth year]   DATE OF CONSULTATION:  DATE OF DISCHARGE:                                   CONSULTATION   HISTORY:  This is a 35 year old woman who was admitted to the hospital to  have surgery on a dermoid cyst.  This was removed and subsequent to that,  she developed symptoms of hypotension.  In her treatment and evaluation, it  was found that she had a serum cortisol that was suppressed at 1.9.  A  Cortrosyn stimulation test was done.  The baseline value at a half hour was  23 and the one-hour value was 24.  She therefore had a brisk response to her  ACTH.  Her serum electrolytes were essentially within normal limits during  this time.  Her sodium was 138 and her potassium was 4.0, chloride 101, her  CO2 was 30.  This would not be consistent with adrenal insufficiency.  The  patient was given hydrocortisone IV, which she had presumably received three  doses of.  There is no history of past pituitary disease that is know to the  patient.   She has a history of hypothyroidism.  The diagnosis of this was made in  November 2005.  She apparently had one TSH level of 7.3.  She was then  started on Synthroid replacement.  A T4 level was apparently done, and this  returned to be normal.  The patient apparently does have a diagnosis of a  possible subclinical hypothyroidism; however, this was only one test that  was done and she was acutely ill with diverticulitis at that time.  This  leads some question as to whether this may actually be a significant  finding.   She has a history of insulin resistance and polycystic ovary disease.  She  has had normal regular periods.  Approximately 11 years prior to this  admission, she had a childbirth.  Subsequent to that  she had irregular  menses and went for a period of seven years without a menstrual period.  Recently she has been found and considered to have polycystic ovary disease  with findings of testosterone levels to be elevated.  She was also noted to  have an LH level to [facility] ratio that was 2.  The review of the chart could not  find any evidence of a prolactin level.   PAST MEDICAL HISTORY:  She has had a hospitalization in November 2005.  At  that time she was found to have a hypothyroidism and diverticulitis.   Medications prior to this admission include:  1.  Hydrochlorothiazide 25 mg daily.  2.  Zestril 20 mg daily for hypertension.  3.  Glucophage 500 mg t.i.d.  4.  Levoxyl 50 mcg daily.   PERSONAL HISTORY:  She does not smoke.   REVIEW OF SYSTEMS:  The patient's weight has been gaining.  CARDIOVASCULAR/RESPIRATORY:  There is no history of chest pain.   PHYSICAL EXAMINATION:  GENERAL:  This is a well-developed, obese woman who  appears clinically stable at this time.  SKIN:  Prominent acanthosis nigricans in the neck.  HEENT:  Her head is normocephalic.  She has marked beard growth.  CHEST:  Her lungs are clear.  CARDIOVASCULAR:  Regular.  ABDOMEN:  Obese.  No masses are present.   IMPRESSION:  1.  Hypotensive episode, probably not related to cortisol deficiency.  2.  Possible hypothyroidism.  3.  Insulin resistance.  4.  Polycystic ovary syndrome.   DISCUSSION:  It is unlikely that the patient has cortisol insufficiency.  The serum cortisol was approximately 1 for her baseline, which is within  normal limits.  The patient also had a serum albumin that was 3.2, so this  slightly low value could be a reflection of their albumin.  A serum  precortisol level would be helpful; however, that is unavailable at this  time.  The patient did have normal electrolytes during this time.  Also, in  view of the brisk response from the adrenal glands, one would postulate if  she did have a  cortisol deficiency, the lesion would have to be in the  pituitary gland, and we know that her hypothyroidism was characterized by a  thyroid-stimulating hormone level of 7.3, which would reflect normal  pituitary function.  At this time I think it is questionable whether the  patient has any cortisol deficiency and, more than likely, she does not.  The question is whether she might have hypothyroidism, and I am not certain  of that.  She had a thyroid-stimulating hormone level of 7.3 and a T4 level  that was normal, so the diagnosis of subclinical hypothyroidism could be  made here and there would be no real indication to treat; however, I think a  reasonable approach would be to stop her Levoxyl therapy at this time and  repeat her studies at a later time when she is in a more metabolically  normal state.  In the event that she does have persistent elevations in her  thyroid-stimulating hormone levels, then I think it would be a reasonable  thing to treat her hypothyroidism.   She does have insulin resistance and findings consistent with polycystic  ovary syndrome.  The prolactin was not available in the chart, and this will  be needed at a later date.   Thank you for the opportunity in seeing this patient.      CGG/MEDQ  D:  10/20/2004  T:  10/20/2004  Job:  [id number]   cc:   Leonette Most A. Clearance Coots, M.D.

## 2011-02-26 NOTE — Op Note (Signed)
NAMEELIZABETH, Renee Ho               ACCOUNT NO.:  [id number]   MEDICAL RECORD NO.:  [id number]          PATIENT TYPE:  INP   LOCATION:  9399                          FACILITY:  [facility]   PHYSICIAN:  Charles A. Clearance Coots, M.D.DATE OF BIRTH:  1976-07-17   DATE OF PROCEDURE:  10/16/2004  DATE OF DISCHARGE:                                 OPERATIVE REPORT   PREOPERATIVE DIAGNOSIS:  Dermoid cyst of right ovary.   POSTOPERATIVE DIAGNOSIS:  Dermoid cyst of right ovary.   PROCEDURE:  Right ovarian cystectomy, biopsy of left ovary.   SURGEON:  Charles A. Clearance Coots, M.D.   ASSISTANT:  Roseanna Rainbow, M.D.   ANESTHESIA:  General.   ESTIMATED BLOOD LOSS:  Negligible.   COMPLICATIONS:  None.   SPECIMENS:  Dermoid cyst of right ovary.  Biopsy specimen:  Left ovarian  cortex.   FINDINGS:  Approximately 5 cm right ovarian cyst with hair and sebaceous  fluid identified.  Corpus luteum cyst of left ovary.   OPERATION:  The patient was brought to the operating room and after  satisfactory general endotracheal anesthesia, the abdomen was prepped and  draped in the usual sterile fashion.  A low transverse incision was then  made on the skin with a scalpel and was deepened down to the fascia with a  scalpel.  The fascia was nicked in the midline and the fascial incision was  extended to the left and to the right with curved Mayo scissors.  The  superior and inferior fascial edges were then taken off of the rectus muscle  with both blunt and sharp dissection.  The rectus muscle was then sharply  and bluntly divided in the midline.  The peritoneum was entered digitally  and was digitally extended to the left and to the right.  The right ovary  was then exteriorized and the capsule of the ovary was scored with the  scalpel and the capsular tissue was then undermined with Metzenbaum scissors  in a circular fashion, and the dermoid cyst was peeled away from its base  within the ovary and submitted to  pathology for evaluation.  The redundant  ovarian tissue was then closed in layers with continuous suture of 3-0  Monocryl to the serosal layer with continuation of closure of the serosa of  the ovarian cortex with baseball-type stitch of 3-0 Monocryl.  Hemostasis  was excellent.  The left ovary was noted to be enlarged and was also  exteriorized and an incision was made lengthwise along the top of the  ovarian cortex down within the cortex and was noted to appear like corpus  luteal-type tissue, but a biopsy of this tissue was excised and submitted to  pathology for frozen section.  The cortex of the ovary was then closed with  continuous interlocking suture of 3-0 Monocryl.  Hemostasis was excellent  and the ovary was placed back in its normal anatomic position.  The pelvic  cavity was irrigated with warm saline solution.  The abdomen was then closed  as follows:  The peritoneum was closed with a continuous suture of 2-0  Monocryl.  The fascia was closed with a continuous suture of 2-0 PDS from  each corner to the center.  Subcutaneous tissue was thoroughly irrigated  with warm saline solution and all areas of subcutaneous bleeding were  coagulated with the Bovie.  Subcutaneous tissue was then approximated with a  continuous suture of 2-0 plain catgut.  The skin was closed with a  continuous subcuticular suture of 3-0 Monocryl.  Sterile bandage was applied  to the incision closure.  The surgical technician indicated that all needle,  sponge, and instrument counts were correct.  The patient tolerated the  procedure well, was transported to the recovery room in satisfactory  condition.     Char   CAH/MEDQ  D:  10/16/2004  T:  10/16/2004  Job:  [id number]

## 2011-02-26 NOTE — H&P (Signed)
[facility]  Patient:    Renee Ho, Renee Ho                      MRN: [id number] Adm. Date:  [id number] Attending:  Lenoard Aden CC:         [facility]   History and Physical  CHIEF COMPLAINT:              Persistent bleeding with secondary anemia.  HISTORY OF PRESENT ILLNESS:   The patient is a 35 year old, white female, G1, P1, who has presented previously in January of 2002 after persistent bleeding through December 2002.  She was previously seen by our practice, with multiple manipulations to include a normal TSH and prolactin panel and multiple birth control tapers which have not been successful in stopping her bleeding.  She continues on iron therapy, however, her hemoglobin has dropped from 10.5 to 9.5 despite multiple attempts at hormonal manipulation.  She had previously had a D&C in January 2002 due to acute bleeding, with minimal results.  She has had an ultrasound which reveals a boggy uterus with a thickened endometrium, but no obvious endometrial masses.  She presents for definitive therapy in the form of diagnostic hysteroscopy and possible resectoscope surgery today.  PAST MEDICAL HISTORY:         Remarkable for one spontaneous vaginal delivery, D&C.  No previous admissions.  REVIEW OF SYSTEMS:            Otherwise negative.  FAMILY HISTORY:               Noncontributory.  SOCIAL HISTORY:               Noncontributory.  PHYSICAL EXAMINATION:  GENERAL:                      The patient is a well-developed, well-nourished, obese, white female in no apparent distress.  HEENT:                        Normal.  LUNGS:                        Clear.  HEART:                        Regular rate and rhythm.  ABDOMEN:                      Soft, obese, nontender.  PELVIC:                       Anteflexed uterus.  No adnexal masses are appreciated.  IMPRESSION:                   Persistent menometrorrhagia with  secondary anemia, nonresponsive to hormonal therapy.  PLAN:                         To perform diagnostic hysteroscopy, possible resectoscope to rule out structural lesion.  Risks of anesthesia, infection, bleeding, injury to abdominal organs with need for repair is discussed. Possible risk of uterine perforation with bowel and bladder injury noted.  The patient acknowledges and desires to proceed. DD:  11/30/00 TD:  11/30/00 Job: [id number]

## 2011-04-19 ENCOUNTER — Telehealth: Payer: Self-pay | Admitting: Internal Medicine

## 2011-04-19 NOTE — Telephone Encounter (Signed)
Patient with severe pain from Plantar Fascitis. Will need appointment for injection. Please schedule her with any available resident any morning this week or add her on to my CC clinic-will be quick! Please call her with time.

## 2011-04-22 ENCOUNTER — Ambulatory Visit (INDEPENDENT_AMBULATORY_CARE_PROVIDER_SITE_OTHER): Payer: Self-pay | Admitting: Internal Medicine

## 2011-04-22 ENCOUNTER — Ambulatory Visit: Payer: Self-pay | Admitting: Internal Medicine

## 2011-04-22 ENCOUNTER — Encounter: Payer: Self-pay | Admitting: Internal Medicine

## 2011-04-22 VITALS — BP 126/79 | HR 86 | Temp 97.8°F | Ht 65.0 in | Wt 279.7 lb

## 2011-04-22 DIAGNOSIS — E119 Type 2 diabetes mellitus without complications: Secondary | ICD-10-CM

## 2011-04-22 DIAGNOSIS — M722 Plantar fascial fibromatosis: Secondary | ICD-10-CM | POA: Insufficient documentation

## 2011-04-22 MED ORDER — EXENATIDE 10 MCG/0.04ML ~~LOC~~ SOPN: 10.0000 ug | PEN_INJECTOR | Freq: Two times a day (BID) | SUBCUTANEOUS | Status: DC

## 2011-04-22 NOTE — Patient Instructions (Signed)
Ice, Ice, Ice! Rest, Rest Rest! Off work until Monday!

## 2011-04-22 NOTE — Progress Notes (Signed)
  Subjective:    Patient ID: Renee Ho, female    DOB: [birth year], 35 y.o.   MRN: [id number]  HPI Patient with continued pain in plantar fascia despite conservative measures-orthotics, ice, NSAIDS, splinting. Opted for injection today given level of pain and how pain is interfering with her mobility and work.      Review of Systems     Objective:   Physical Exam Right foot with significant plantar fascia point tenderness, medial calcaneous. No erythema. Limited ROM.       Assessment & Plan:   Injection of right medial plantar fascia performed w/o complication. 2cc of 1% Lidocaine used with 1cc of solumedrol. No bleeding. Patient tolerated well. Instructed to do minimal wt being for next 24 hours and no strenuous activity until Monday when she will return to work. Excuse for work given for tomorrow. ICE, REST, ELEVATION strongly recommended. F/U if ain not improved.

## 2011-04-22 NOTE — Assessment & Plan Note (Signed)
Steroid injection right foot today.

## 2011-04-22 NOTE — Assessment & Plan Note (Addendum)
A1C much improved 7.8 with increased dose of Byetta, no changes today. F/U in 2 months.

## 2011-05-24 ENCOUNTER — Telehealth: Payer: Self-pay | Admitting: Dietician

## 2011-05-24 NOTE — Telephone Encounter (Signed)
Patient on vacation. To call as needed when she returns.

## 2011-06-07 ENCOUNTER — Other Ambulatory Visit: Payer: Self-pay | Admitting: Internal Medicine

## 2011-06-07 MED ORDER — CIPROFLOXACIN HCL 500 MG PO TABS: 250.0000 mg | ORAL_TABLET | Freq: Two times a day (BID) | ORAL | Status: AC

## 2011-06-08 NOTE — Progress Notes (Signed)
  Subjective:    Patient ID: Renee Ho, female    DOB: Sep 14, 1976, 35 y.o.   MRN: [id number]  HPI C/O URI symptoms, with intense ear pain X3 days, decreased hearing. Cough, sore throat. Fatigue+   Review of Systems No CP, NVD. No SOB, ?fever chills.    Objective:   Physical Exam Bilateral ear effusions, red TMs bilaterally. Also has significant cerument impaction bilaterally. Throat red inflammed, nose with green congestion and inflammed mucosa. Chest CTAB, Heart RRR. ABD S, NT, +BS       Assessment & Plan:  See problem based charting.

## 2011-07-05 LAB — I-STAT 8, (EC8 V) (CONVERTED LAB)
Acid-Base Excess: 3 — ABNORMAL HIGH
Bicarbonate: 23.8
Chloride: 109
HCT: 48 — ABNORMAL HIGH
TCO2: 25
pCO2, Ven: 26.5 — ABNORMAL LOW
pH, Ven: 7.561 — ABNORMAL HIGH

## 2011-07-05 LAB — URINE MICROSCOPIC-ADD ON

## 2011-07-05 LAB — DIFFERENTIAL
Basophils Absolute: 0
Basophils Relative: 1
Eosinophils Absolute: 0.3
Monocytes Relative: 6
Neutrophils Relative %: 57

## 2011-07-05 LAB — D-DIMER, QUANTITATIVE (NOT AT ARMC): D-Dimer, Quant: 0.6 — ABNORMAL HIGH

## 2011-07-05 LAB — URINALYSIS, ROUTINE W REFLEX MICROSCOPIC
Hgb urine dipstick: NEGATIVE
Nitrite: NEGATIVE
Specific Gravity, Urine: 1.038 — ABNORMAL HIGH
Urobilinogen, UA: 1
pH: 5

## 2011-07-05 LAB — TSH: TSH: 6.231 — ABNORMAL HIGH

## 2011-07-05 LAB — BASIC METABOLIC PANEL
Calcium: 8.6
GFR calc Af Amer: >60
GFR calc non Af Amer: >60
Glucose, Bld: 240 — ABNORMAL HIGH
Sodium: 137

## 2011-07-05 LAB — RAPID URINE DRUG SCREEN, HOSP PERFORMED
Barbiturates: NOT DETECTED
Benzodiazepines: NOT DETECTED
Cocaine: NOT DETECTED
Opiates: NOT DETECTED

## 2011-07-05 LAB — LIPID PANEL
Cholesterol: 211 — ABNORMAL HIGH
HDL: 21 — ABNORMAL LOW
Total CHOL/HDL Ratio: 10

## 2011-07-05 LAB — POCT CARDIAC MARKERS
CKMB, poc: <1 — ABNORMAL LOW
Troponin i, poc: <0.05

## 2011-07-05 LAB — COMPREHENSIVE METABOLIC PANEL
ALT: 37 — ABNORMAL HIGH
AST: 39 — ABNORMAL HIGH
CO2: 26
Chloride: 101
GFR calc Af Amer: >60
GFR calc non Af Amer: >60
Glucose, Bld: 240 — ABNORMAL HIGH
Sodium: 135
Total Bilirubin: 0.9

## 2011-07-05 LAB — PREGNANCY, URINE: Preg Test, Ur: NEGATIVE

## 2011-07-05 LAB — CBC
MCHC: 35.8
MCV: 87.3
Platelets: 170
RDW: 13.1

## 2011-07-05 LAB — CARDIAC PANEL(CRET KIN+CKTOT+MB+TROPI): Total CK: 98

## 2011-07-05 LAB — B-NATRIURETIC PEPTIDE (CONVERTED LAB): Pro B Natriuretic peptide (BNP): <30

## 2011-07-05 LAB — HIV ANTIBODY (ROUTINE TESTING W REFLEX): HIV: NONREACTIVE

## 2011-07-05 LAB — POCT I-STAT CREATININE: Operator id: 196461

## 2011-08-10 ENCOUNTER — Other Ambulatory Visit: Payer: Self-pay | Admitting: Internal Medicine

## 2011-08-17 ENCOUNTER — Other Ambulatory Visit: Payer: Self-pay | Admitting: Internal Medicine

## 2011-08-17 DIAGNOSIS — E785 Hyperlipidemia, unspecified: Secondary | ICD-10-CM

## 2011-08-17 MED ORDER — FENOFIBRATE 145 MG PO TABS: 145.0000 mg | ORAL_TABLET | Freq: Every day | ORAL | Status: DC

## 2011-08-17 MED ORDER — ATORVASTATIN CALCIUM 40 MG PO TABS: 40.0000 mg | ORAL_TABLET | Freq: Every day | ORAL | Status: DC

## 2011-08-17 NOTE — Progress Notes (Signed)
To help with financial strains will switch Renee Ho to Lipitor and Fenofibrate which have 0$ copays. Appreciate recs by Pharm D at [facility] to wellness.

## 2011-08-18 ENCOUNTER — Encounter: Payer: Self-pay | Admitting: Dietician

## 2011-08-24 ENCOUNTER — Telehealth: Payer: Self-pay | Admitting: *Deleted

## 2011-08-24 NOTE — Telephone Encounter (Signed)
Pharmacy calls and asks if they can switch from name brand tricor 145 to fenofibrate 160mg , it will be much more cost efficient for pt. Please change med list to reflect this

## 2011-08-25 MED ORDER — FENOFIBRATE 160 MG PO TABS: 160.0000 mg | ORAL_TABLET | Freq: Every day | ORAL | Status: DC

## 2011-08-25 NOTE — Telephone Encounter (Signed)
Change made.

## 2011-09-20 ENCOUNTER — Other Ambulatory Visit: Payer: Self-pay | Admitting: Internal Medicine

## 2011-09-20 MED ORDER — DULOXETINE HCL 30 MG PO CPEP: 30.0000 mg | ORAL_CAPSULE | Freq: Every day | ORAL | Status: DC

## 2011-12-29 ENCOUNTER — Encounter: Payer: Self-pay | Admitting: Internal Medicine

## 2011-12-29 ENCOUNTER — Ambulatory Visit (INDEPENDENT_AMBULATORY_CARE_PROVIDER_SITE_OTHER): Payer: 59 | Admitting: Internal Medicine

## 2011-12-29 VITALS — BP 132/85 | HR 83 | Temp 98.0°F | Ht 65.0 in | Wt 276.4 lb

## 2011-12-29 DIAGNOSIS — M722 Plantar fascial fibromatosis: Secondary | ICD-10-CM

## 2011-12-29 DIAGNOSIS — E781 Pure hyperglyceridemia: Secondary | ICD-10-CM

## 2011-12-29 DIAGNOSIS — Z79899 Other long term (current) drug therapy: Secondary | ICD-10-CM

## 2011-12-29 DIAGNOSIS — F329 Major depressive disorder, single episode, unspecified: Secondary | ICD-10-CM

## 2011-12-29 DIAGNOSIS — E119 Type 2 diabetes mellitus without complications: Secondary | ICD-10-CM

## 2011-12-29 DIAGNOSIS — F32A Depression, unspecified: Secondary | ICD-10-CM

## 2011-12-29 LAB — GLUCOSE, CAPILLARY: Glucose-Capillary: 182 mg/dL — ABNORMAL HIGH (ref 70–99)

## 2011-12-29 MED ORDER — DULOXETINE HCL 60 MG PO CPEP: 60.0000 mg | ORAL_CAPSULE | Freq: Every day | ORAL | Status: DC

## 2011-12-29 NOTE — Patient Instructions (Addendum)
It was nice to meet you today, Renee Ho. I think that you have a good strategy to help you remember to take your evening pills and insulin injection. We will check your cholesterol level today.  If you dose of cholesterol medicine needs to be adjusted, we will contact you. I have increased your dose of Cymbalta as you requested.  If you continue to have tearful episodes or depressed mood please contact the clinic. Push yourself to make an effort to take your medicines as prescribed. Return to see me in about 3-4 months so that we may re-evaluate your diabetes regimen.

## 2011-12-29 NOTE — Assessment & Plan Note (Addendum)
Left foot improved since steroid injection 04/2011.  Reports that same symptoms of foot pain starting in right foot. Monitor with consideration for repeat steroid injection in the right foot.

## 2011-12-30 ENCOUNTER — Other Ambulatory Visit: Payer: Self-pay | Admitting: Internal Medicine

## 2011-12-30 DIAGNOSIS — F32A Depression, unspecified: Secondary | ICD-10-CM | POA: Insufficient documentation

## 2011-12-30 DIAGNOSIS — F329 Major depressive disorder, single episode, unspecified: Secondary | ICD-10-CM | POA: Insufficient documentation

## 2011-12-30 LAB — LIPID PANEL: Total CHOL/HDL Ratio: 6.5 Ratio

## 2011-12-30 LAB — LDL CHOLESTEROL, DIRECT: Direct LDL: 82 mg/dL

## 2011-12-30 NOTE — Assessment & Plan Note (Signed)
Stable with Hgb A1c 7.7  Could be under better control with better compliance to prescribed regimen.  Will continue Byetta at 10 mcg and Metformin 1000 mg bid.  Discussed several strategies to increase compliance which pt was receptive to ie, having a place for meds at table when setting dinner table.

## 2011-12-30 NOTE — Assessment & Plan Note (Signed)
Continued occasional tearfulness and irritability on Cymbalta 30 mg daily. Will increase to therapeutic dose of 60 mg daily.

## 2011-12-30 NOTE — Assessment & Plan Note (Signed)
Check Lipid panel today and direct LDL. Unclear of compliance with her fenofibrate and atorvastatin

## 2011-12-30 NOTE — Progress Notes (Signed)
Subjective:     Patient ID: Renee Ho, female   DOB: 03/30/1976, 36 y.o.   MRN: [id number]  HPI Followup of her diabetes, hypertriglyceridemia, hypertension and depression. States that she will admit to not taking her prescribed regimen of the evening dose of metformin and Byetta. 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 She thinks her right foot may be starting to have plantar fasciitis. Reports that the steroid shot to the left foot has totally resolved her left-sided plantar fasciitis. States that she has continued occasional teariness and irritability and thinks that she could benefit from increasing her Cymbalta.  Review of Systems  Constitutional: Negative for fatigue.  HENT: Negative for congestion.   Eyes: Negative for visual disturbance.  Respiratory: Negative for shortness of breath.   Cardiovascular: Negative for chest pain.  Gastrointestinal: Negative for abdominal distention.  Genitourinary: Negative for dysuria.  Musculoskeletal: Negative for arthralgias.  Neurological: Negative for headaches.  Hematological: Does not bruise/bleed easily.  Psychiatric/Behavioral: Positive for dysphoric mood and agitation. Negative for confusion and decreased concentration.       Objective:   Physical Exam  Constitutional: She is oriented to person, place, and time. She appears well-developed and well-nourished. No distress.  HENT:  Head: Normocephalic and atraumatic.  Eyes: Conjunctivae and EOM are normal. Pupils are equal, round, and reactive to light.  Neck: Normal range of motion. Neck supple.    Abdominal: Soft. Bowel sounds are normal. There is no tenderness.       Morbidly obese  Musculoskeletal: Normal range of motion. She exhibits no edema and no tenderness.  Neurological: She is alert and oriented to person, place, and time.  Skin: Skin is warm and dry.  Psychiatric: She has a normal mood and affect.       Assessment:     #1 Diabetes mellitus: Poor control hemoglobin A1c 7.7, patient reports poor compliance with evening doses of Byetta and metformin  #2 Hypertriglyceridemia: on fenofibrate and atorvastatin, return on her atorvastatin every (she cannot recall which one) thus has not been taking it for at least a week or so  #3 Depression: Patient poor her mood is been stable, she continues to have occasional tearing this and agitation. Requesting increased dose of Cymbalta  #4 right foot discomfort: Currently not having pain or causing her problems     Plan:     See problem list

## 2012-03-01 ENCOUNTER — Emergency Department (HOSPITAL_COMMUNITY): Payer: 59

## 2012-03-01 ENCOUNTER — Emergency Department (HOSPITAL_COMMUNITY)
Admission: EM | Admit: 2012-03-01 | Discharge: 2012-03-01 | Disposition: A | Discharge status: Home / Self Care | Payer: 59 | Attending: Emergency Medicine | Admitting: Emergency Medicine

## 2012-03-01 ENCOUNTER — Encounter (HOSPITAL_COMMUNITY): Payer: Self-pay

## 2012-03-01 ENCOUNTER — Emergency Department (HOSPITAL_COMMUNITY)
Admission: EM | Admit: 2012-03-01 | Discharge: 2012-03-01 | Disposition: A | Discharge status: Other Acute Inpatient Hospital | Payer: 59 | Source: Home / Self Care | Attending: Emergency Medicine | Admitting: Emergency Medicine

## 2012-03-01 DIAGNOSIS — R1032 Left lower quadrant pain: Secondary | ICD-10-CM | POA: Insufficient documentation

## 2012-03-01 DIAGNOSIS — N898 Other specified noninflammatory disorders of vagina: Secondary | ICD-10-CM | POA: Insufficient documentation

## 2012-03-01 DIAGNOSIS — Z79899 Other long term (current) drug therapy: Secondary | ICD-10-CM | POA: Insufficient documentation

## 2012-03-01 DIAGNOSIS — R110 Nausea: Secondary | ICD-10-CM | POA: Insufficient documentation

## 2012-03-01 DIAGNOSIS — R109 Unspecified abdominal pain: Secondary | ICD-10-CM

## 2012-03-01 DIAGNOSIS — Z7982 Long term (current) use of aspirin: Secondary | ICD-10-CM | POA: Insufficient documentation

## 2012-03-01 DIAGNOSIS — N83209 Unspecified ovarian cyst, unspecified side: Secondary | ICD-10-CM | POA: Insufficient documentation

## 2012-03-01 DIAGNOSIS — E7800 Pure hypercholesterolemia, unspecified: Secondary | ICD-10-CM | POA: Insufficient documentation

## 2012-03-01 DIAGNOSIS — E119 Type 2 diabetes mellitus without complications: Secondary | ICD-10-CM | POA: Insufficient documentation

## 2012-03-01 DIAGNOSIS — Z794 Long term (current) use of insulin: Secondary | ICD-10-CM | POA: Insufficient documentation

## 2012-03-01 HISTORY — DX: Diverticulosis of large intestine without perforation or abscess without bleeding: K57.30

## 2012-03-01 HISTORY — DX: Pure hypercholesterolemia, unspecified: E78.00

## 2012-03-01 LAB — DIFFERENTIAL
Basophils Relative: 1 % (ref 0–1)
Eosinophils Relative: 2 % (ref 0–5)
Lymphs Abs: 2.5 10*3/uL (ref 0.7–4.0)
Monocytes Relative: 6 % (ref 3–12)
Neutro Abs: 5.4 10*3/uL (ref 1.7–7.7)

## 2012-03-01 LAB — URINALYSIS, ROUTINE W REFLEX MICROSCOPIC
Hgb urine dipstick: NEGATIVE
Nitrite: NEGATIVE
Specific Gravity, Urine: 1.023 (ref 1.005–1.030)
Urobilinogen, UA: 0.2 mg/dL (ref 0.0–1.0)

## 2012-03-01 LAB — WET PREP, GENITAL
Trich, Wet Prep: NONE SEEN
Yeast Wet Prep HPF POC: NONE SEEN

## 2012-03-01 LAB — CBC
HCT: 43.7 % (ref 36.0–46.0)
Hemoglobin: 15.2 g/dL — ABNORMAL HIGH (ref 12.0–15.0)
MCV: 88.8 fL (ref 78.0–100.0)
Platelets: 165 10*3/uL (ref 150–400)
RBC: 4.92 MIL/uL (ref 3.87–5.11)
WBC: 8.7 10*3/uL (ref 4.0–10.5)

## 2012-03-01 LAB — BASIC METABOLIC PANEL
GFR calc non Af Amer: >90 mL/min (ref >90)
Glucose, Bld: 140 mg/dL — ABNORMAL HIGH (ref 70–99)
Potassium: 3.9 mEq/L (ref 3.5–5.1)
Sodium: 136 mEq/L (ref 135–145)

## 2012-03-01 MED ORDER — ONDANSETRON HCL 4 MG/2ML IJ SOLN
4.0000 mg | Freq: Once | INTRAMUSCULAR | Status: AC
  Administered 2012-03-01: 4 mg via INTRAVENOUS
  Filled 2012-03-01: qty 2

## 2012-03-01 MED ORDER — ONDANSETRON 4 MG PO TBDP
ORAL_TABLET | ORAL | Status: AC
  Filled 2012-03-01: qty 1

## 2012-03-01 MED ORDER — HYDROMORPHONE HCL PF 1 MG/ML IJ SOLN
1.0000 mg | Freq: Once | INTRAMUSCULAR | Status: AC
  Administered 2012-03-01: 1 mg via INTRAVENOUS
  Filled 2012-03-01: qty 1

## 2012-03-01 MED ORDER — HYDROMORPHONE HCL PF 1 MG/ML IJ SOLN
INTRAMUSCULAR | Status: AC
  Filled 2012-03-01: qty 1

## 2012-03-01 MED ORDER — IBUPROFEN 800 MG PO TABS: 800.0000 mg | ORAL_TABLET | Freq: Three times a day (TID) | ORAL | Status: AC

## 2012-03-01 MED ORDER — PROMETHAZINE HCL 25 MG/ML IJ SOLN
12.5000 mg | Freq: Once | INTRAMUSCULAR | Status: AC
  Administered 2012-03-01: 12.5 mg via INTRAVENOUS
  Filled 2012-03-01: qty 1

## 2012-03-01 MED ORDER — SODIUM CHLORIDE 0.9 % IV BOLUS (SEPSIS)
500.0000 mL | Freq: Once | INTRAVENOUS | Status: AC
  Administered 2012-03-01: 500 mL via INTRAVENOUS

## 2012-03-01 MED ORDER — HYDROMORPHONE HCL PF 1 MG/ML IJ SOLN
1.0000 mg | Freq: Once | INTRAMUSCULAR | Status: AC
  Administered 2012-03-01: 1 mg via INTRAMUSCULAR

## 2012-03-01 MED ORDER — OXYCODONE-ACETAMINOPHEN 5-325 MG PO TABS: 1.0000 | ORAL_TABLET | ORAL | Status: AC | PRN

## 2012-03-01 MED ORDER — HYDROMORPHONE HCL PF 1 MG/ML IJ SOLN
0.5000 mg | Freq: Once | INTRAMUSCULAR | Status: AC
  Administered 2012-03-01: 0.5 mg via INTRAVENOUS
  Filled 2012-03-01: qty 1

## 2012-03-01 MED ORDER — ONDANSETRON 4 MG PO TBDP
4.0000 mg | ORAL_TABLET | Freq: Once | ORAL | Status: AC
  Administered 2012-03-01: 4 mg via ORAL

## 2012-03-01 MED ORDER — IOHEXOL 300 MG/ML  SOLN
20.0000 mL | INTRAMUSCULAR | Status: AC
  Administered 2012-03-01: 20 mL via ORAL

## 2012-03-01 MED ORDER — IOHEXOL 300 MG/ML  SOLN
100.0000 mL | Freq: Once | INTRAMUSCULAR | Status: AC | PRN
  Administered 2012-03-01: 100 mL via INTRAVENOUS

## 2012-03-01 NOTE — Discharge Instructions (Signed)
WARM COMPRESSES FOR COMFORT. TAKE MEDICATIONS AS PRESCRIBED. RETURN HERE WITH ANY HIGH FEVER, OR SEVERE PAIN. FOLLOW UP WITH DR. Ernestina Penna FOR RECHECK AND RECOMMENDED REPEAT ULTRASOUND IN 4-6 WEEKS.  Ovarian Cyst The ovaries are small organs that are on each side of the uterus. The ovaries are the organs that produce the female hormones, estrogen and progesterone. An ovarian cyst is a sac filled with fluid that can vary in its size. It is normal for a small cyst to form in women who are in the childbearing age and who have menstrual periods. This type of cyst is called a follicle cyst that becomes an ovulation cyst (corpus luteum cyst) after it produces the women's egg. It later goes away on its own if the woman does not become pregnant. There are other kinds of ovarian cysts that may cause problems and may need to be treated. The most serious problem is a cyst with cancer. It should be noted that menopausal women who have an ovarian cyst are at a higher risk of it being a cancer cyst. They should be evaluated very quickly, thoroughly and followed closely. This is especially true in menopausal women because of the high rate of ovarian cancer in women in menopause. CAUSES AND TYPES OF OVARIAN CYSTS:  FUNCTIONAL CYST: The follicle/corpus luteum cyst is a functional cyst that occurs every month during ovulation with the menstrual cycle. They go away with the next menstrual cycle if the woman does not get pregnant. Usually, there are no symptoms with a functional cyst.   ENDOMETRIOMA CYST: This cyst develops from the lining of the uterus tissue. This cyst gets in or on the ovary. It grows every month from the bleeding during the menstrual period. It is also called a "chocolate cyst" because it becomes filled with blood that turns brown. This cyst can cause pain in the lower abdomen during intercourse and with your menstrual period.   CYSTADENOMA CYST: This cyst develops from the cells on the outside of the  ovary. They usually are not cancerous. They can get very big and cause lower abdomen pain and pain with intercourse. This type of cyst can twist on itself, cut off its blood supply and cause severe pain. It also can easily rupture and cause a lot of pain.   DERMOID CYST: This type of cyst is sometimes found in both ovaries. They are found to have different kinds of body tissue in the cyst. The tissue includes skin, teeth, hair, and/or cartilage. They usually do not have symptoms unless they get very big. Dermoid cysts are rarely cancerous.   POLYCYSTIC OVARY: This is a rare condition with hormone problems that produces many small cysts on both ovaries. The cysts are follicle-like cysts that never produce an egg and become a corpus luteum. It can cause an increase in body weight, infertility, acne, increase in body and facial hair and lack of menstrual periods or rare menstrual periods. Many women with this problem develop type 2 diabetes. The exact cause of this problem is unknown. A polycystic ovary is rarely cancerous.   THECA LUTEIN CYST: Occurs when too much hormone (human chorionic gonadotropin) is produced and over-stimulates the ovaries to produce an egg. They are frequently seen when doctors stimulate the ovaries for invitro-fertilization (test tube babies).   LUTEOMA CYST: This cyst is seen during pregnancy. Rarely it can cause an obstruction to the birth canal during labor and delivery. They usually go away after delivery.  SYMPTOMS   Pelvic pain or  pressure.   Pain during sexual intercourse.   Increasing girth (swelling) of the abdomen.   Abnormal menstrual periods.   Increasing pain with menstrual periods.   You stop having menstrual periods and you are not pregnant.  DIAGNOSIS  The diagnosis can be made during:  Routine or annual pelvic examination (common).   Ultrasound.   X-ray of the pelvis.   CT Scan.   MRI.   Blood tests.  TREATMENT   Treatment may only be to  follow the cyst monthly for 2 to 3 months with your caregiver. Many go away on their own, especially functional cysts.   May be aspirated (drained) with a long needle with ultrasound, or by laparoscopy (inserting a tube into the pelvis through a small incision).   The whole cyst can be removed by laparoscopy.   Sometimes the cyst may need to be removed through an incision in the lower abdomen.   Hormone treatment is sometimes used to help dissolve certain cysts.   Birth control pills are sometimes used to help dissolve certain cysts.  HOME CARE INSTRUCTIONS  Follow your caregiver's advice regarding:  Medicine.   Follow up visits to evaluate and treat the cyst.   You may need to come back or make an appointment with another caregiver, to find the exact cause of your cyst, if your caregiver is not a gynecologist.   Get your yearly and recommended pelvic examinations and Pap tests.   Let your caregiver know if you have had an ovarian cyst in the past.  SEEK MEDICAL CARE IF:   Your periods are late, irregular, they stop, or are painful.   Your stomach (abdomen) or pelvic pain does not go away.   Your stomach becomes larger or swollen.   You have pressure on your bladder or trouble emptying your bladder completely.   You have painful sexual intercourse.   You have feelings of fullness, pressure, or discomfort in your stomach.   You lose weight for no apparent reason.   You feel generally ill.   You become constipated.   You lose your appetite.   You develop acne.   You have an increase in body and facial hair.   You are gaining weight, without changing your exercise and eating habits.   You think you are pregnant.  SEEK IMMEDIATE MEDICAL CARE IF:   You have increasing abdominal pain.   You feel sick to your stomach (nausea) and/or vomit.   You develop a fever that comes on suddenly.   You develop abdominal pain during a bowel movement.   Your menstrual periods  become heavier than usual.  Document Released: 09/27/2005 Document Revised: 09/16/2011 Document Reviewed: 07/31/2009 [facility] Patient Information 2012 [place], [place].

## 2012-03-01 NOTE — ED Notes (Signed)
PO contrast given- ID verified prior to admin

## 2012-03-01 NOTE — ED Provider Notes (Signed)
History     CSN: [id number]  Arrival date & time 03/01/12  1324   First MD Initiated Contact with Patient 03/01/12 1032      HPI Patient presents today with severe left lower quadrant pain that began last night. Describes pain as a twisting pain in her left lower quadrant. Symptoms are similar to prior diverticulitis she had 6 or 7 years ago. Reports symptoms associated with nausea. Patient reports a history of loose stool. States this has not changed. Denies hematochezia, fever, vomiting, back pain, dysuria, urinary frequency, urinary urgency. Reports history of hysterectomy.   The history is provided by the patient.    The history is provided by the patient.    Past Medical History  Diagnosis Date  . Diabetes mellitus   . Diverticula of intestine   . High cholesterol     History reviewed. No pertinent past surgical history.  History reviewed. No pertinent family history.  History  Substance Use Topics  . Smoking status: Former Smoker    Quit date: 01/21/2011  . Smokeless tobacco: Not on file   Comment: In Smoking Cessation Program on patches  . Alcohol Use: Not on file    OB History    Grav Para Term Preterm Abortions TAB SAB Ect Mult Living                  Review of Systems  Constitutional: Negative for fever and chills.  Respiratory: Negative for shortness of breath.   Cardiovascular: Negative for chest pain.  Gastrointestinal: Positive for nausea and abdominal pain. Negative for vomiting, diarrhea and constipation.  Genitourinary: Negative for dysuria, urgency, frequency, hematuria, flank pain, vaginal discharge and vaginal pain.  Musculoskeletal: Negative for back pain.  All other systems reviewed and are negative.    Allergies  Metoclopramide hcl and Morphine  Home Medications   Current Outpatient Rx  Name Route Sig Dispense Refill  . ASPIRIN 81 MG PO TBEC Oral Take 81 mg by mouth daily. 30 minutes before Niaspan     . ONETOUCH BASIC SYSTEM  W/DEVICE KIT  Test blood sugar as directed     . DULOXETINE HCL 60 MG PO CPEP Oral Take 1 capsule (60 mg total) by mouth daily. 30 capsule 2  . METFORMIN HCL 1000 MG PO TABS  TAKE 1 TABLET BY MOUTH TWO TIMES A DAY 60 tablet 10  . ATORVASTATIN CALCIUM 40 MG PO TABS Oral Take 1 tablet (40 mg total) by mouth daily. 30 tablet 11  . EXENATIDE 10 MCG/0.04ML [place] SOLN Subcutaneous Inject 0.1 mLs (25 mcg total) into the skin 2 (two) times daily with a meal. 2.4 mL 6  . FENOFIBRATE 160 MG PO TABS Oral Take 1 tablet (160 mg total) by mouth daily. 90 tablet 3  . GLUCOSE BLOOD VI STRP  Use to check blood sugars three times a day or as directed     . INSULIN PEN NEEDLE 31G X 5 MM MISC  Use as directed     . ONETOUCH DELICA LANCETS MISC  Use to check blood sugars three times a day or as directed     . ROSUVASTATIN CALCIUM 10 MG PO TABS Oral Take 1 tablet (10 mg total) by mouth at bedtime. 30 tablet 0    BP 146/87  Pulse 72  Temp(Src) 98.6 F (37 C) (Oral)  Resp 18  SpO2 97%  Physical Exam  Vitals reviewed. Constitutional: She is oriented to person, place, and time. Vital signs are normal. She appears  well-developed and well-nourished.  HENT:  Head: Normocephalic and atraumatic.  Eyes: Conjunctivae are normal. Pupils are equal, round, and reactive to light.  Neck: Normal range of motion. Neck supple.  Cardiovascular: Normal rate, regular rhythm and normal heart sounds.  Exam reveals no friction rub.   No murmur heard. Pulmonary/Chest: Effort normal and breath sounds normal. She has no wheezes. She has no rhonchi. She has no rales. She exhibits no tenderness.  Abdominal: Soft. Bowel sounds are normal. She exhibits no distension and no mass. There is no hepatosplenomegaly. There is tenderness in the left lower quadrant. There is no rigidity, no rebound, no guarding, no CVA tenderness, no tenderness at McBurney's point and negative Murphy's sign.  Musculoskeletal: Normal range of motion.  Neurological:  She is alert and oriented to person, place, and time. Coordination normal.  Skin: Skin is warm and dry. No rash noted. No erythema. No pallor.    ED Course  Procedures   MDM   We'll give patient IM Dilaudid and Zofran ODT. Transferred to ED for rule out diverticulitis and treatment.      Thomasene Lot, PA-C 03/01/12 1125

## 2012-03-01 NOTE — ED Notes (Signed)
Feels as if I am having a flare up of my diverticulitis , feels as if some one has punched me in stomach or is wringing out my colon

## 2012-03-01 NOTE — ED Notes (Signed)
Patient presents with LLQ abdominal pain since yesterday. Patient seen at urgent care and transferred for further evaluation to rule out diverticulitis.  Patient has history of diverticulitis in past and reports the pain is similar.  Patient reporting nausea, denise vomiting, reporting liquid diarrhea for past several days.

## 2012-03-01 NOTE — ED Provider Notes (Signed)
History     CSN: [id number]  Arrival date & time 03/01/12  1140   First MD Initiated Contact with Patient 03/01/12 1148      Chief Complaint  Patient presents with  . Abdominal Pain    (Consider location/radiation/quality/duration/timing/severity/associated sxs/prior treatment) HPI  Patient presents to emergency department complaining of gradual onset left lower quadrant pain that began yesterday and has been constant and persistently worsening over the course of the day. Patient states pain is similar to pain that she had when she had diverticulitis 6-7 years ago. Patient states she has history of hysterectomy but no other abdominal surgeries. Patient states pain is associated with nausea but denies vomiting. Patient states she's had some loose stool over last few days but denies any blood in her stool. She denies fevers, chills, chest pain, shortness of breath, vaginal discharge, dysuria, hematuria or pelvic pain. Patient denies aggravating or alleviating factors. Patient taken nothing for pain prior to arrival. Patient is followed by outpatient clinics his primary care provider. Patient was initially seen at the urgent care to the emergency department for further evaluation and management. Past Medical History  Diagnosis Date  . Diabetes mellitus   . Diverticula of intestine   . High cholesterol     History reviewed. No pertinent past surgical history.  No family history on file.  History  Substance Use Topics  . Smoking status: Former Smoker    Quit date: 01/21/2011  . Smokeless tobacco: Not on file   Comment: In Smoking Cessation Program on patches  . Alcohol Use: Yes    OB History    Grav Para Term Preterm Abortions TAB SAB Ect Mult Living                  Review of Systems  All other systems reviewed and are negative.    Allergies  Metoclopramide hcl and Morphine  Home Medications   Current Outpatient Rx  Name Route Sig Dispense Refill  . ASPIRIN 81 MG  PO TBEC Oral Take 81 mg by mouth daily. 30 minutes before Niaspan     . DULOXETINE HCL 60 MG PO CPEP Oral Take 1 capsule (60 mg total) by mouth daily. 30 capsule 2  . EXENATIDE 10 MCG/0.04ML [place] SOLN Subcutaneous Inject 0.1 mLs (25 mcg total) into the skin 2 (two) times daily with a meal. 2.4 mL 6  . FENOFIBRATE 160 MG PO TABS Oral Take 1 tablet (160 mg total) by mouth daily. 90 tablet 3  . METFORMIN HCL 1000 MG PO TABS  TAKE 1 TABLET BY MOUTH TWO TIMES A DAY 60 tablet 10  . ONETOUCH BASIC SYSTEM W/DEVICE KIT  Test blood sugar as directed     . GLUCOSE BLOOD VI STRP  Use to check blood sugars three times a day or as directed     . INSULIN PEN NEEDLE 31G X 5 MM MISC  Use as directed     . ONETOUCH DELICA LANCETS MISC  Use to check blood sugars three times a day or as directed     . ROSUVASTATIN CALCIUM 10 MG PO TABS Oral Take 1 tablet (10 mg total) by mouth at bedtime. 30 tablet 0    BP 146/83  Pulse 73  Temp(Src) 98.5 F (36.9 C) (Oral)  Resp 20  SpO2 95%  Physical Exam  Nursing note and vitals reviewed. Constitutional: She is oriented to person, place, and time. She appears well-developed and well-nourished. No distress.  HENT:  Head: Normocephalic and  atraumatic.  Eyes: Conjunctivae are normal.  Neck: Normal range of motion. Neck supple.  Cardiovascular: Normal rate, regular rhythm, normal heart sounds and intact distal pulses.  Exam reveals no gallop and no friction rub.   No murmur heard. Pulmonary/Chest: Effort normal and breath sounds normal. No respiratory distress. She has no wheezes. She has no rales. She exhibits no tenderness.  Abdominal: Soft. Bowel sounds are normal. She exhibits no distension and no mass. There is tenderness. There is no rebound and no guarding.       LLQ TTP with guarding but no rigidity or peritoneal signs.   Genitourinary: Vaginal discharge found.       Thick white vaginal d/c. Surgically absent cervix and uterus.   Musculoskeletal: Normal range of  motion.  Neurological: She is alert and oriented to person, place, and time.  Skin: Skin is warm and dry. No rash noted. She is not diaphoretic. No erythema.  Psychiatric: She has a normal mood and affect.    ED Course  Procedures (including critical care time)  IV dilaudid and zofran.   Labs Reviewed  CBC - Abnormal; Notable for the following:    Hemoglobin 15.2 (*)    All other components within normal limits  BASIC METABOLIC PANEL - Abnormal; Notable for the following:    Glucose, Bld 140 (*)    All other components within normal limits  URINALYSIS, ROUTINE W REFLEX MICROSCOPIC - Abnormal; Notable for the following:    APPearance CLOUDY (*)    Glucose, UA 100 (*)    Bilirubin Urine SMALL (*)    All other components within normal limits  WET PREP, GENITAL - Abnormal; Notable for the following:    Clue Cells Wet Prep HPF POC FEW (*)    All other components within normal limits  DIFFERENTIAL  GC/CHLAMYDIA PROBE AMP, GENITAL   No results found.   No diagnosis found.    MDM  Sign out given to Vedia Pereyra PA-C with dispo pending CT results. VSS. NAD.         [place], [place] 03/01/12 1529

## 2012-03-01 NOTE — ED Notes (Signed)
PA at bedside.

## 2012-03-01 NOTE — ED Provider Notes (Signed)
Medical screening examination/treatment/procedure(s) were performed by non-physician practitioner and as supervising physician I was immediately available for consultation/collaboration.  Raynald Blend, MD 03/01/12 1254

## 2012-03-01 NOTE — ED Provider Notes (Signed)
4:00 - Arrives in CDU to wait for CT scan abd. To r/o diverticulitis. Additional pain medication ordered at her request. She has completed one cup contrast but having difficulty with second due to nausea. Zofran given. Will continue to observe and wait for CT results.  Pain controlled with multiple doses medications. No further vomiting. CT negative for acute abdominal findings, showing an ovarian cyst - recommending 4 week ultrasound to re-assess. Findings discussed with the patient. Stable for discharge home.  Rodena Medin, PA-C 03/01/12 1927

## 2012-03-02 LAB — GC/CHLAMYDIA PROBE AMP, GENITAL: GC Probe Amp, Genital: NEGATIVE

## 2012-03-02 NOTE — ED Provider Notes (Signed)
Medical screening examination/treatment/procedure(s) were conducted as a shared visit with non-physician practitioner(s) and myself.  I personally evaluated the patient during the encounter Pt with abd pain.  pe tender abd  Benny Lennert, MD 03/02/12 [phone]

## 2012-03-02 NOTE — ED Provider Notes (Signed)
Medical screening examination/treatment/procedure(s) were performed by non-physician practitioner and as supervising physician I was immediately available for consultation/collaboration.   Benny Lennert, MD 03/02/12 [phone]

## 2012-04-26 ENCOUNTER — Ambulatory Visit (INDEPENDENT_AMBULATORY_CARE_PROVIDER_SITE_OTHER): Payer: 59 | Admitting: Internal Medicine

## 2012-04-26 ENCOUNTER — Encounter: Payer: Self-pay | Admitting: Internal Medicine

## 2012-04-26 VITALS — BP 141/90 | HR 72 | Temp 98.2°F | Ht 65.0 in | Wt 281.6 lb

## 2012-04-26 DIAGNOSIS — Z79899 Other long term (current) drug therapy: Secondary | ICD-10-CM

## 2012-04-26 DIAGNOSIS — F32A Depression, unspecified: Secondary | ICD-10-CM

## 2012-04-26 DIAGNOSIS — F329 Major depressive disorder, single episode, unspecified: Secondary | ICD-10-CM

## 2012-04-26 DIAGNOSIS — R739 Hyperglycemia, unspecified: Secondary | ICD-10-CM

## 2012-04-26 DIAGNOSIS — E781 Pure hyperglyceridemia: Secondary | ICD-10-CM

## 2012-04-26 DIAGNOSIS — I10 Essential (primary) hypertension: Secondary | ICD-10-CM

## 2012-04-26 DIAGNOSIS — M722 Plantar fascial fibromatosis: Secondary | ICD-10-CM

## 2012-04-26 DIAGNOSIS — E119 Type 2 diabetes mellitus without complications: Secondary | ICD-10-CM

## 2012-04-26 DIAGNOSIS — G4730 Sleep apnea, unspecified: Secondary | ICD-10-CM

## 2012-04-26 DIAGNOSIS — F3289 Other specified depressive episodes: Secondary | ICD-10-CM

## 2012-04-26 MED ORDER — METFORMIN HCL 1000 MG PO TABS: 1000.0000 mg | ORAL_TABLET | Freq: Two times a day (BID) | ORAL | Status: DC

## 2012-04-26 MED ORDER — SITAGLIPTIN PHOSPHATE 50 MG PO TABS: 50.0000 mg | ORAL_TABLET | Freq: Every day | ORAL | Status: DC

## 2012-04-26 MED ORDER — FENOFIBRATE 160 MG PO TABS: 160.0000 mg | ORAL_TABLET | Freq: Every day | ORAL | Status: DC

## 2012-04-26 MED ORDER — GLUCOSE BLOOD VI STRP: ORAL_STRIP | Status: DC

## 2012-04-26 MED ORDER — DULOXETINE HCL 60 MG PO CPEP: 60.0000 mg | ORAL_CAPSULE | Freq: Every day | ORAL | Status: DC

## 2012-04-26 NOTE — Patient Instructions (Addendum)
--  Follow up in two weeks for diabetes medication follow up --Please see an eye doctor for a diabetic eye exam --Please come back for labs at your earliest convenience.

## 2012-04-28 NOTE — Assessment & Plan Note (Signed)
Mildly hypertensive at this visit. Would recheck during next visit and consider anti-hypertensive medications.

## 2012-04-28 NOTE — Assessment & Plan Note (Signed)
Pt currently on CPAP, with dry mouth in the morning. She will call [facility] to assure appropriate function of her heated humidity therapy.

## 2012-04-28 NOTE — Assessment & Plan Note (Signed)
No complaints today.

## 2012-04-28 NOTE — Assessment & Plan Note (Addendum)
Lab Results  Component Value Date   HGBA1C 8.6 04/26/2012   HGBA1C 7.2 08/27/2010   CREATININE 0.57 03/01/2012   CREATININE 0.76 02/05/2011   MICROALBUR 1.41 02/05/2011   MICRALBCREAT 8.6 02/05/2011   CHOL 181 12/29/2011   HDL 28* 12/29/2011   TRIG 543* 12/29/2011    Last eye exam and foot exam:    Component Value Date/Time   HMDIABEYEEXA no diabetic retinopathy 08/21/2009   HMDIABFOOTEX done 08/27/2010    Assessment: Diabetes control: not controlled Progress toward goals: deteriorated Barriers to meeting goals: nonadherence to medications  Plan: Diabetes treatment: Byetta with Januvia Refer to: none Instruction/counseling given: discussed the need for weight loss  The pt declined a referral to the diabetes educator and was hesitant to acknowledge the need for weight loss  Per [facility] outpatient Pharmacist, Dr. 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  --Discontinued Byetta --Ordered sitagliptin Alma Friendly) 50mg  tablet once per day. Pt to follow up in two weeks for possible side effects of this medication and increase to 100mg  daily if none.  --Pt advised to make an appointment for a diabetic eye exam.   --Pt to return for lab visit for urine microalbumin order

## 2012-04-28 NOTE — Assessment & Plan Note (Addendum)
Stable. Pt still feels sad, with crying spells. Has noted improvement with Cymbalta.  Will refill cymbalta today.

## 2012-04-28 NOTE — Progress Notes (Signed)
Subjective:    Patient ID: Renee Ho, female    DOB: January 16, 1976, 36 y.o.   MRN: [id number]  HPI Renee Ho is a 36 year-old woman with PMH significant for PCOS, DM2, depression, OSA on CPAP who comes in today for diabetes follow up. She tells me she is not compliant with her Byetta and has been using it less then 3 times per week. She is however, compliant with her metformin 1000 mg twice a day. She does not own a CBG meter, her CBG is 192 today, her HbA1C is 8.6, significantly more elevated than 7.7 on her last visit on 12/29/2011. She walks every day for at least 30 minutes but she had struggled changing her diet. She wears comfortable shoes and has no concerns about her feet today. 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  Her symptoms of depression are stable and have not improved since starting Cymbalta. She still has periods of crying or feeling depressed and fatigued. She does note improvement in her mood over fall and would like to continue taking Cymbalta.  She tells me she is has had a full hysterectomy with remaining right ovary. She was told she had PCOS and a large cyst in her right ovary that is painful at times. She reports hirsutism with facial hair on her chin, irritability, acne, and receding hair loss. She quit smoking on April 2012 and has never been on hormone replacement therapy for her PCOS.   She has been wearing CPAP machine for sleep apnea for years now but has noticed increased dry mouth in the morning recently. She has had her current CPAP machine for over a year that provides heated humidity.   She has no other complaints today.      Review of Systems  Constitutional: Positive for fatigue. Negative for fever, activity change, appetite change and unexpected weight change.  HENT: Negative for congestion and rhinorrhea.   Eyes: Negative for visual disturbance.  Respiratory: Negative for cough, chest tightness and  shortness of breath.   Cardiovascular: Positive for leg swelling. Negative for chest pain and palpitations.  Gastrointestinal: Negative for nausea, vomiting and abdominal pain.  Genitourinary: Negative for difficulty urinating.  Neurological: Negative for dizziness, tremors, light-headedness and headaches.  Psychiatric/Behavioral: Negative for suicidal ideas, behavioral problems, disturbed wake/sleep cycle, self-injury and dysphoric mood.       Objective:   Physical Exam  Constitutional: She is oriented to person, place, and time. She appears well-developed and well-nourished. No distress.       Morbidly obese.   HENT:  Head: Normocephalic and atraumatic.       Mild female pattern hair loss. Hirsutism, facial hair noted over chin.   Neck: Neck supple.  Cardiovascular: Normal rate, regular rhythm and intact distal pulses.        Distant heart sounds secondary to body habitus  Pulmonary/Chest: Effort normal and breath sounds normal. No respiratory distress. She has no wheezes. She has no rales. She exhibits no tenderness.  Abdominal: Soft.  Musculoskeletal: Normal range of motion. She exhibits edema. She exhibits no tenderness.       Trace pitting edema bilaterally up to knees  Neurological: She is alert and oriented to person, place, and time. She has normal reflexes.  Skin: Skin is warm and dry. No rash noted. She is not diaphoretic. No erythema.  Psychiatric: She has a normal mood and affect. Her behavior is normal.  Assessment & Plan:

## 2012-04-28 NOTE — Assessment & Plan Note (Signed)
>>  ASSESSMENT AND PLAN FOR ESSENTIAL HYPERTENSION WRITTEN ON 04/28/2012  2:45 PM BY Ky Barban, MD  Mildly hypertensive at this visit. Would recheck during next visit and consider anti-hypertensive medications.

## 2012-04-28 NOTE — Assessment & Plan Note (Signed)
Pt compliant with fenofibrate. Would like to discuss diet and referral to [facility] for low fat diet. Pt hesitant to discuss weight and diet during this visit.

## 2012-05-01 NOTE — Progress Notes (Signed)
agree

## 2012-05-15 ENCOUNTER — Encounter (HOSPITAL_COMMUNITY): Payer: Self-pay | Admitting: Emergency Medicine

## 2012-05-15 ENCOUNTER — Telehealth: Payer: Self-pay | Admitting: *Deleted

## 2012-05-15 ENCOUNTER — Emergency Department (HOSPITAL_COMMUNITY)
Admission: EM | Admit: 2012-05-15 | Discharge: 2012-05-15 | Disposition: A | Discharge status: Home / Self Care | Payer: 59 | Source: Home / Self Care | Attending: Emergency Medicine | Admitting: Emergency Medicine

## 2012-05-15 DIAGNOSIS — M25461 Effusion, right knee: Secondary | ICD-10-CM

## 2012-05-15 DIAGNOSIS — M25469 Effusion, unspecified knee: Secondary | ICD-10-CM

## 2012-05-15 LAB — SYNOVIAL CELL COUNT + DIFF, W/ CRYSTALS
Crystals, Fluid: NONE SEEN
WBC, Synovial: 1460 /mm3 — ABNORMAL HIGH (ref 0–200)

## 2012-05-15 MED ORDER — IBUPROFEN 800 MG PO TABS
ORAL_TABLET | ORAL | Status: AC
  Filled 2012-05-15: qty 1

## 2012-05-15 MED ORDER — LIDOCAINE-EPINEPHRINE-TETRACAINE (LET) SOLUTION
3.0000 mL | Freq: Once | NASAL | Status: AC
  Administered 2012-05-15: 3 mL via TOPICAL

## 2012-05-15 MED ORDER — HYDROCODONE-ACETAMINOPHEN 5-325 MG PO TABS: 2.0000 | ORAL_TABLET | ORAL | Status: AC | PRN

## 2012-05-15 MED ORDER — IBUPROFEN 800 MG PO TABS
800.0000 mg | ORAL_TABLET | Freq: Once | ORAL | Status: AC
  Administered 2012-05-15: 800 mg via ORAL

## 2012-05-15 MED ORDER — LIDOCAINE-EPINEPHRINE-TETRACAINE (LET) SOLUTION
NASAL | Status: AC
  Filled 2012-05-15: qty 3

## 2012-05-15 MED ORDER — MELOXICAM 15 MG PO TABS: 15.0000 mg | ORAL_TABLET | Freq: Every day | ORAL | Status: DC

## 2012-05-15 NOTE — ED Notes (Addendum)
Assisted provider with right knee effusion.140cc fluid drained.sterile pressure dressing applied with ace wrap.pt tolerated ok.IBUPROFEN 800MG  TAB GIVEN POST PAIN

## 2012-05-15 NOTE — ED Provider Notes (Signed)
History     CSN: [id number]  Arrival date & time 05/15/12  1732   First MD Initiated Contact with Patient 05/15/12 1743      Chief Complaint  Patient presents with  . Knee Pain    (Consider location/radiation/quality/duration/timing/severity/associated sxs/prior treatment) HPI Comments: Patient with a history of recurrent atraumatic right knee effusions secondary to remote trauma presents with right knee pain, swelling. States that she is having trouble bending her knee due to the swelling, and that the pain is now radiating up her leg. She feels that her knee is unstable, secondary to pain. No nausea, vomiting, fevers. No erythema. She states that this is identical to the previous 2 times where she is required therapeutic drainage. States that previous analysis have been negative for infection and gout. 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 No recent injury to her knee. Patient is diabetic, and is a former smoker, currently on smoking cessation plan.  ROS as noted in HPI. All other ROS negative.   Patient is a 36 y.o. female presenting with knee pain. The history is provided by the patient. No language interpreter was used.  Knee Pain This is a recurrent problem. The current episode started more than 2 days ago. The problem occurs constantly. The problem has been gradually worsening. The symptoms are aggravated by walking. Nothing relieves the symptoms. She has tried rest and a cold compress for the symptoms. The treatment provided no relief.    Past Medical History  Diagnosis Date  . Diabetes mellitus   . Diverticula of intestine   . High cholesterol     History reviewed. No pertinent past surgical history.  No family history on file.  History  Substance Use Topics  . Smoking status: Current Everyday Smoker    Last Attempt to Quit: 01/21/2011  . Smokeless tobacco: Not on file   Comment: In Smoking Cessation Program on patches  . Alcohol Use: Yes     OB History    Grav Para Term Preterm Abortions TAB SAB Ect Mult Living                  Review of Systems  Allergies  Metoclopramide hcl and Morphine  Home Medications   Current Outpatient Rx  Name Route Sig Dispense Refill  . ASPIRIN 81 MG PO TBEC Oral Take 81 mg by mouth daily. 30 minutes before Niaspan     . ONETOUCH BASIC SYSTEM W/DEVICE KIT  Test blood sugar as directed     . DULOXETINE HCL 60 MG PO CPEP Oral Take 1 capsule (60 mg total) by mouth daily. 30 capsule 6  . FENOFIBRATE 160 MG PO TABS Oral Take 1 tablet (160 mg total) by mouth daily. 90 tablet 5  . GLUCOSE BLOOD VI STRP  Use to check blood sugars three times a day or as directed 100 each 1  . HYDROCODONE-ACETAMINOPHEN 5-325 MG PO TABS Oral Take 2 tablets by mouth every 4 (four) hours as needed for pain. 20 tablet 0  . INSULIN PEN NEEDLE 31G X 5 MM MISC  Use as directed     . MELOXICAM 15 MG PO TABS Oral Take 1 tablet (15 mg total) by mouth daily. 14 tablet 0  . METFORMIN HCL 1000 MG PO TABS Oral Take 1 tablet (1,000 mg total) by mouth 2 (two) times daily with a meal. 60 tablet 10  . ONETOUCH DELICA LANCETS MISC  Use to check blood sugars  three times a day or as directed     . ROSUVASTATIN CALCIUM 10 MG PO TABS Oral Take 1 tablet (10 mg total) by mouth at bedtime. 30 tablet 0  . SITAGLIPTIN PHOSPHATE 50 MG PO TABS Oral Take 1 tablet (50 mg total) by mouth daily. 30 tablet 0    BP 177/92  Pulse 73  Temp 98.7 F (37.1 C) (Oral)  Resp 18  SpO2 97%  Physical Exam  Nursing note and vitals reviewed. Constitutional: She is oriented to person, place, and time. She appears well-developed and well-nourished. No distress.  HENT:  Head: Normocephalic and atraumatic.  Eyes: Conjunctivae and EOM are normal.  Neck: Normal range of motion.  Cardiovascular: Normal rate.   Pulmonary/Chest: Effort normal.  Abdominal: She exhibits no distension.  Musculoskeletal: Normal range of motion.       Large right knee effusion.  No erythema, increased temperature, signs of trauma, signs of infection. Knee ROM decreased due to pain and swelling, Flexion/extension  Intact. Tenderness entire joint, Patella NT, Patellar apprehension test negative, Patellar tendon NT, Medial joint NT, Lateral joint NT, Popliteal region NT, Lachman's stable, Varus stress testing stable, Valgus stress testing stable, McMurray's testing normal, distal NVI with intact baseline sensation / motor / pulse distal to knee  Neurological: She is alert and oriented to person, place, and time. Coordination normal.  Skin: Skin is warm and dry.  Psychiatric: She has a normal mood and affect. Her behavior is normal. Judgment and thought content normal.    ED Course  Apiration of blood/fluid Date/Time: 05/15/2012 8:32 PM Performed by: Luiz Blare Authorized by: Luiz Blare Consent: Verbal consent obtained. Risks and benefits: risks, benefits and alternatives were discussed Consent given by: patient Patient understanding: patient states understanding of the procedure being performed Patient consent: the patient's understanding of the procedure matches consent given Procedure consent: procedure consent matches procedure scheduled Site marked: the operative site was marked Required items: required blood products, implants, devices, and special equipment available Patient identity confirmed: verbally with patient, provided demographic data and arm band Time out: Immediately prior to procedure a "time out" was called to verify the correct patient, procedure, equipment, support staff and site/side marked as required. Preparation: Patient was prepped and draped in the usual sterile fashion. Local anesthesia used: yes Local anesthetic: lidocaine 2% with epinephrine Anesthetic total: 2 ml Patient sedated: no Patient tolerance: Patient tolerated the procedure well with no immediate complications. Comments: Drained 140 cc of clear yellow liquid  consistent with serous effusion. Doubt infection or gout. Sent this off for a Gram stain, micro, cultures, synovial LDH, synovial lactate, crystals. Applied sterile pressure dressing and Ace wrap post procedure.   (including critical care time)  Labs Reviewed - No data to display No results found.   1. Knee effusion, right     MDM  Patient is afebrile here, and has not taken anything that would mask a potential fever. Doubt infection or gout but sending joint fluid off for analysis. Patient will give [place] a working phone number if we need to contact her. Rest, ice, Norco, NSAIDs. She will followup with Dr. Ophelia Charter, orthopedic surgeon on call, or [facility] in several days for reevaluation, repeat arthrocentesis if necessary, further management. Discussed signs and symptoms that should prompt her return to the department. Patient agrees with plan.  Luiz Blare, MD 05/15/12 [phone]

## 2012-05-15 NOTE — Telephone Encounter (Addendum)
Pt calls and states her knee is twice the size of the other and she can hardly walk today, she has almost fallen twice today at work but must continue working. appt 8/6 at 1415 per chilonb. Dr Everardo Beals

## 2012-05-15 NOTE — ED Notes (Signed)
PT HERE WITH RIGHT KNEE SWELLING AND PAIN SHOOTING UP TO THIGH AREA/GROIN X 3 DYS AGO.STATES IN THE PAST KNEE WAS DRAINED TWICE.PAIN WORSENS WITH AMBULATION.ICE AND ELEVATION USED.

## 2012-05-16 ENCOUNTER — Encounter: Payer: Self-pay | Admitting: Internal Medicine

## 2012-05-16 LAB — LACTATE DEHYDROGENASE, PLEURAL OR PERITONEAL FLUID: LD, Fluid: 170 U/L — ABNORMAL HIGH (ref 3–23)

## 2012-05-19 ENCOUNTER — Telehealth (HOSPITAL_COMMUNITY): Payer: Self-pay | Admitting: *Deleted

## 2012-05-19 LAB — BODY FLUID CULTURE: Culture: NO GROWTH

## 2012-05-19 NOTE — ED Notes (Signed)
LD fluid 170 H, Cell count + diff with cryst-synvl fld: Yellow hazy, no crystals seen, WBC's 1460 H, Neutrophil 1, Lymphs 63 H, Monos 36 L,Body fluid culture: no growth 3 days, Path review: Increased cells predominately macrophages.  Labs shown to Dr. Lorenz Coaster.  He said to tell pt. No infection, no gout, just inflammatory knee fluid, take the Mobic and f/u with Dr. Ophelia Charter as directed. I called pt. Pt. verified x 2 and given Dr. Melanee Spry instructions. Pt. said her appt. with the orthopedist today was cancelled. I told her he should be able to access her results in the system.  Vassie Moselle 05/19/2012

## 2012-06-12 ENCOUNTER — Encounter (HOSPITAL_COMMUNITY): Payer: Self-pay | Admitting: Family Medicine

## 2012-06-12 ENCOUNTER — Emergency Department (HOSPITAL_COMMUNITY)
Admission: EM | Admit: 2012-06-12 | Discharge: 2012-06-13 | Disposition: A | Discharge status: Home / Self Care | Payer: 59 | Attending: Emergency Medicine | Admitting: Emergency Medicine

## 2012-06-12 DIAGNOSIS — E7800 Pure hypercholesterolemia, unspecified: Secondary | ICD-10-CM | POA: Insufficient documentation

## 2012-06-12 DIAGNOSIS — Z794 Long term (current) use of insulin: Secondary | ICD-10-CM | POA: Insufficient documentation

## 2012-06-12 DIAGNOSIS — F17200 Nicotine dependence, unspecified, uncomplicated: Secondary | ICD-10-CM | POA: Insufficient documentation

## 2012-06-12 DIAGNOSIS — K5289 Other specified noninfective gastroenteritis and colitis: Secondary | ICD-10-CM | POA: Insufficient documentation

## 2012-06-12 DIAGNOSIS — E119 Type 2 diabetes mellitus without complications: Secondary | ICD-10-CM | POA: Insufficient documentation

## 2012-06-12 DIAGNOSIS — Z7982 Long term (current) use of aspirin: Secondary | ICD-10-CM | POA: Insufficient documentation

## 2012-06-12 DIAGNOSIS — K529 Noninfective gastroenteritis and colitis, unspecified: Secondary | ICD-10-CM

## 2012-06-12 HISTORY — DX: Polycystic ovarian syndrome: E28.2

## 2012-06-12 LAB — CBC WITH DIFFERENTIAL/PLATELET
Eosinophils Absolute: 0 10*3/uL (ref 0.0–0.7)
Lymphocytes Relative: 35 % (ref 12–46)
Lymphs Abs: 1.2 10*3/uL (ref 0.7–4.0)
MCHC: 33.7 g/dL (ref 30.0–36.0)
Monocytes Relative: 14 % — ABNORMAL HIGH (ref 3–12)
Neutro Abs: 1.7 10*3/uL (ref 1.7–7.7)
Platelets: 133 10*3/uL — ABNORMAL LOW (ref 150–400)
RDW: 13.9 % (ref 11.5–15.5)
WBC: 3.5 10*3/uL — ABNORMAL LOW (ref 4.0–10.5)

## 2012-06-12 LAB — COMPREHENSIVE METABOLIC PANEL
Alkaline Phosphatase: 46 U/L (ref 39–117)
BUN: 11 mg/dL (ref 6–23)
Chloride: 101 mEq/L (ref 96–112)
GFR calc Af Amer: >90 mL/min (ref >90)
Glucose, Bld: 134 mg/dL — ABNORMAL HIGH (ref 70–99)
Potassium: 3.5 mEq/L (ref 3.5–5.1)
Total Bilirubin: 0.3 mg/dL (ref 0.3–1.2)
Total Protein: 6.4 g/dL (ref 6.0–8.3)

## 2012-06-12 LAB — URINALYSIS, ROUTINE W REFLEX MICROSCOPIC
Ketones, ur: NEGATIVE mg/dL
Leukocytes, UA: NEGATIVE
Nitrite: NEGATIVE
Protein, ur: NEGATIVE mg/dL

## 2012-06-12 LAB — GLUCOSE, CAPILLARY: Glucose-Capillary: 154 mg/dL — ABNORMAL HIGH (ref 70–99)

## 2012-06-12 MED ORDER — PROMETHAZINE HCL 25 MG PO TABS: 25.0000 mg | ORAL_TABLET | Freq: Four times a day (QID) | ORAL | Status: DC | PRN

## 2012-06-12 MED ORDER — KETOROLAC TROMETHAMINE 30 MG/ML IJ SOLN
30.0000 mg | Freq: Once | INTRAMUSCULAR | Status: AC
Start: 2012-06-12 — End: 2012-06-12
  Administered 2012-06-12: 30 mg via INTRAVENOUS
  Filled 2012-06-12: qty 1

## 2012-06-12 MED ORDER — SODIUM CHLORIDE 0.9 % IV BOLUS (SEPSIS)
1000.0000 mL | Freq: Once | INTRAVENOUS | Status: AC
  Administered 2012-06-12: 1000 mL via INTRAVENOUS

## 2012-06-12 MED ORDER — HYDROCODONE-ACETAMINOPHEN 5-325 MG PO TABS
1.0000 | ORAL_TABLET | Freq: Four times a day (QID) | ORAL | Status: DC | PRN
Start: 2012-06-12 — End: 2012-06-19

## 2012-06-12 NOTE — ED Provider Notes (Cosign Needed)
History     CSN: [id number]  Arrival date & time 06/12/12  2057   First MD Initiated Contact with Patient 06/12/12 2211      Chief Complaint  Patient presents with  . Fever  . Generalized Body Aches  . Emesis  . Diarrhea    (Consider location/radiation/quality/duration/timing/severity/associated sxs/prior treatment) Patient is a 36 y.o. female presenting with fever, vomiting, and diarrhea. The history is provided by the patient (pt complains of abd pain and vomiting).  Fever Primary symptoms of the febrile illness include fever, vomiting and diarrhea. Primary symptoms do not include fatigue, headaches, cough, abdominal pain or rash. The current episode started today. This is a new problem. The problem has not changed since onset. Associated with: nothing. Risk factors for febrile illness include diabetes mellitus. Emesis  This is a new problem. The current episode started yesterday. The problem occurs 5 to 10 times per day. The problem has not changed since onset.The emesis has an appearance of stomach contents. Associated symptoms include diarrhea and a fever. Pertinent negatives include no abdominal pain, no cough and no headaches.  Diarrhea The primary symptoms include fever, vomiting and diarrhea. Primary symptoms do not include fatigue, abdominal pain or rash.  The illness does not include back pain.    Past Medical History  Diagnosis Date  . Diabetes mellitus   . Diverticula of intestine   . High cholesterol   . Polycystic ovarian syndrome     Past Surgical History  Procedure Date  . Abdominal hysterectomy   . Ovarian cyst removal     History reviewed. No pertinent family history.  History  Substance Use Topics  . Smoking status: Current Everyday Smoker -- 0.5 packs/day    Last Attempt to Quit: 01/21/2011  . Smokeless tobacco: Not on file   Comment: In Smoking Cessation Program on patches  . Alcohol Use: Yes     rarely    OB History    Grav Para Term  Preterm Abortions TAB SAB Ect Mult Living                  Review of Systems  Constitutional: Positive for fever. Negative for fatigue.  HENT: Negative for congestion, sinus pressure and ear discharge.   Eyes: Negative for discharge.  Respiratory: Negative for cough.   Cardiovascular: Negative for chest pain.  Gastrointestinal: Positive for vomiting and diarrhea. Negative for abdominal pain.  Genitourinary: Negative for frequency and hematuria.  Musculoskeletal: Negative for back pain.  Skin: Negative for rash.  Neurological: Negative for seizures and headaches.  Hematological: Negative.   Psychiatric/Behavioral: Negative for hallucinations.    Allergies  Metoclopramide hcl and Morphine  Home Medications   Current Outpatient Rx  Name Route Sig Dispense Refill  . ASPIRIN 81 MG PO TBEC Oral Take 81 mg by mouth daily. 30 minutes before Niaspan    . ONETOUCH BASIC SYSTEM W/DEVICE KIT  Test blood sugar as directed     . DULOXETINE HCL 60 MG PO CPEP Oral Take 60 mg by mouth daily.    . FENOFIBRATE 160 MG PO TABS Oral Take 160 mg by mouth daily.    [name] GLUCOSE BLOOD VI STRP  Use to check blood sugars three times a day or as directed    . INSULIN PEN NEEDLE 31G X 5 MM MISC  Use as directed     . MELOXICAM 15 MG PO TABS Oral Take 15 mg by mouth daily.    [name] METFORMIN HCL 1000 MG PO  TABS Oral Take 1,000 mg by mouth 2 (two) times daily with a meal.    . ONETOUCH DELICA LANCETS MISC  Use to check blood sugars three times a day or as directed     . SITAGLIPTIN PHOSPHATE 50 MG PO TABS Oral Take 50 mg by mouth daily.    [name] HYDROCODONE-ACETAMINOPHEN 5-325 MG PO TABS Oral Take 1 tablet by mouth every 6 (six) hours as needed for pain. 20 tablet 0  . PROMETHAZINE HCL 25 MG PO TABS Oral Take 1 tablet (25 mg total) by mouth every 6 (six) hours as needed for nausea. 15 tablet 0    BP 133/75  Pulse 67  Temp 98.2 F (36.8 C) (Oral)  Resp 22  SpO2 95%  Physical Exam  Constitutional: She is  oriented to person, place, and time. She appears well-developed.  HENT:  Head: Normocephalic and atraumatic.  Eyes: Conjunctivae and EOM are normal. No scleral icterus.  Neck: Neck supple. No thyromegaly present.  Cardiovascular: Normal rate and regular rhythm.  Exam reveals no gallop and no friction rub.   No murmur heard. Pulmonary/Chest: No stridor. She has no wheezes. She has no rales. She exhibits no tenderness.  Abdominal: She exhibits no distension. There is tenderness. There is no rebound.  Musculoskeletal: Normal range of motion. She exhibits no edema.  Lymphadenopathy:    She has no cervical adenopathy.  Neurological: She is oriented to person, place, and time. Coordination normal.  Skin: No rash noted. No erythema.  Psychiatric: She has a normal mood and affect. Her behavior is normal.    ED Course  Procedures (including critical care time)  Labs Reviewed  GLUCOSE, CAPILLARY - Abnormal; Notable for the following:    Glucose-Capillary 154 (*)     All other components within normal limits  CBC WITH DIFFERENTIAL - Abnormal; Notable for the following:    WBC 3.5 (*)     Platelets 133 (*)     Monocytes Relative 14 (*)     All other components within normal limits  COMPREHENSIVE METABOLIC PANEL - Abnormal; Notable for the following:    Glucose, Bld 134 (*)     All other components within normal limits  URINALYSIS, ROUTINE W REFLEX MICROSCOPIC - Abnormal; Notable for the following:    Specific Gravity, Urine 1.034 (*)     All other components within normal limits   No results found.   1. Gastroenteritis       MDM          Benny Lennert, MD 06/12/12 2350

## 2012-06-12 NOTE — ED Notes (Addendum)
Pt reports she has fluid drained from right knee on Friday and had a cortisone shot. States since then she has been having fevers, body aches all over, states she had two episodes of vomiting today and multiple episodes of diarrhea.

## 2012-06-12 NOTE — ED Notes (Signed)
Pt did not want an In and Out cath, pt sts that she can void without an In and Out cath

## 2012-06-13 ENCOUNTER — Encounter: Payer: Self-pay | Admitting: Internal Medicine

## 2012-06-13 ENCOUNTER — Ambulatory Visit (HOSPITAL_COMMUNITY)
Admission: RE | Admit: 2012-06-13 | Discharge: 2012-06-13 | Disposition: A | Discharge status: Home / Self Care | Payer: 59 | Source: Ambulatory Visit | Attending: Internal Medicine | Admitting: Internal Medicine

## 2012-06-13 ENCOUNTER — Telehealth: Payer: Self-pay | Admitting: *Deleted

## 2012-06-13 ENCOUNTER — Ambulatory Visit (INDEPENDENT_AMBULATORY_CARE_PROVIDER_SITE_OTHER): Payer: 59 | Admitting: Internal Medicine

## 2012-06-13 VITALS — BP 146/88 | HR 76 | Temp 99.3°F | Ht 65.0 in | Wt 278.7 lb

## 2012-06-13 DIAGNOSIS — R509 Fever, unspecified: Secondary | ICD-10-CM

## 2012-06-13 DIAGNOSIS — E119 Type 2 diabetes mellitus without complications: Secondary | ICD-10-CM

## 2012-06-13 DIAGNOSIS — K5730 Diverticulosis of large intestine without perforation or abscess without bleeding: Secondary | ICD-10-CM | POA: Insufficient documentation

## 2012-06-13 DIAGNOSIS — R109 Unspecified abdominal pain: Secondary | ICD-10-CM

## 2012-06-13 DIAGNOSIS — I10 Essential (primary) hypertension: Secondary | ICD-10-CM

## 2012-06-13 DIAGNOSIS — R21 Rash and other nonspecific skin eruption: Secondary | ICD-10-CM | POA: Insufficient documentation

## 2012-06-13 DIAGNOSIS — R933 Abnormal findings on diagnostic imaging of other parts of digestive tract: Secondary | ICD-10-CM | POA: Insufficient documentation

## 2012-06-13 DIAGNOSIS — K5792 Diverticulitis of intestine, part unspecified, without perforation or abscess without bleeding: Secondary | ICD-10-CM | POA: Insufficient documentation

## 2012-06-13 DIAGNOSIS — K5732 Diverticulitis of large intestine without perforation or abscess without bleeding: Secondary | ICD-10-CM

## 2012-06-13 DIAGNOSIS — R1032 Left lower quadrant pain: Secondary | ICD-10-CM | POA: Insufficient documentation

## 2012-06-13 LAB — BASIC METABOLIC PANEL WITH GFR
Calcium: 8.8 mg/dL (ref 8.4–10.5)
GFR, Est African American: >89 mL/min
Sodium: 137 mEq/L (ref 135–145)

## 2012-06-13 LAB — CBC
MCH: 30 pg (ref 26.0–34.0)
MCHC: 34.1 g/dL (ref 30.0–36.0)
Platelets: 120 10*3/uL — ABNORMAL LOW (ref 150–400)

## 2012-06-13 MED ORDER — CIPROFLOXACIN HCL 500 MG PO TABS: 500.0000 mg | ORAL_TABLET | Freq: Two times a day (BID) | ORAL | Status: AC

## 2012-06-13 MED ORDER — IOHEXOL 300 MG/ML  SOLN
100.0000 mL | Freq: Once | INTRAMUSCULAR | Status: AC | PRN
  Administered 2012-06-13: 100 mL via INTRAVENOUS

## 2012-06-13 MED ORDER — METRONIDAZOLE 500 MG PO TABS: 500.0000 mg | ORAL_TABLET | Freq: Three times a day (TID) | ORAL | Status: DC

## 2012-06-13 MED ORDER — ACETAMINOPHEN 325 MG PO TABS: 650.0000 mg | ORAL_TABLET | ORAL | Status: DC | PRN

## 2012-06-13 NOTE — Assessment & Plan Note (Addendum)
Given patient's history of diverticulosis and diverticulitis, Dr. Eben Burow suggested to get CT abdomen/pelvis, which showed "Suspect early/uncomplicated diverticulitis involving the mid/distal sigmoid colon. No evidence of perforation or drainable fluid collection". Patient's CBC showed WBC is 2.8 and thrombocytopenia which are consistent with infection. Will treat patient with ciprofloxacin 500 mg twice a day and metronidazole 500 mg 3 times a day for 10 days and follow up in 2 weeks. We also treat patient symptomatically with Phenergan for nausea and also for rashes.    CBC:    Component Value Date/Time   WBC 2.8* 06/13/2012 1538   HGB 14.1 06/13/2012 1538   HCT 41.3 06/13/2012 1538   PLT 120* 06/13/2012 1538   MCV 87.9 06/13/2012 1538   NEUTROABS 1.7 06/12/2012 2244   LYMPHSABS 1.2 06/12/2012 2244   MONOABS 0.5 06/12/2012 2244   EOSABS 0.0 06/12/2012 2244   BASOSABS 0.0 06/12/2012 2244

## 2012-06-13 NOTE — Assessment & Plan Note (Signed)
Her blood pressure is slightly elevated. Blood pressure is 146/88, which might be due to acute sickness. We'll continue current regimen and followup in 2 weeks.

## 2012-06-13 NOTE — Patient Instructions (Addendum)
1. I will let you know the next plan after CT-abdomen/pelvis is completed. 2. Please get your phenegan refilled for nausea. 3. Please take tylenol for body pain and fever, 650 mg every 6 hours.  4. If you have worsening of your symptoms or new symptoms arise, please call the clinic ([phone]), or go to the ER immediately if symptoms are severe.

## 2012-06-13 NOTE — Telephone Encounter (Signed)
Pt calls and states she went to Ed and was told she has a virus, today she has rash on face and chest and temp of 101.6 she would like to be seen, appt per [place] and sharonb dr Clyde Lundborg 1415 today, pt is agreeable, she is asked to please place a mask on when she arrives at the clinic, she is agreeable

## 2012-06-13 NOTE — Assessment & Plan Note (Signed)
Etiology for her rashes is not clear currently. It is likely due to allergic reaction to Toradol. Patient received a dose of IV Toradol yesterday and developed rashes in this morning. Patient will be treated with Phenergan for her nausea which may also be effective for rashes. We'll follow up in 2 weeks.

## 2012-06-13 NOTE — Progress Notes (Signed)
Patient ID: Renee Ho, Renee Ho   DOB: [birth year], 36 y.o.   MRN: [id number]  Subjective:   Patient ID: Renee Ho Renee Ho   DOB: May 25, 1976 36 y.o.   MRN: [id number]  HPI: Renee Ho is a 36 y.o. this past medical history as outlined below, who presents for a followup visit following her ED visit.  Patient visited ED on 06/12/12 because of nausea, vomiting and diarrhea. Patient started having fever (up to 102.1), nausea, diarrhea and vomiting 4 days ago. She reports having 2-5 times of bowel moments each day, and vomiting food materials without blood in it. She denies any sick contact. She has very mild abdominal pain which is located at the lower abdomen bilaterally. She denies any constipation before the symptoms started. Currently she has a severe nausea and whole body aching. In her visit to ED yesterday, patient was given IV Toradol and a prescription of Phenergan. But the patient didn't pick up her Phenergan pills Patient is taking Tylenol at home which is helpful for fever, but not for her nausea, vomiting and diarrhea. This morning patient developed rashes on her chest, anterior neck and shoulders. Her rashes are red in color, not elevated from the skin. There is no itches or tenderness over the rashes. Off note, patient has history of diverticulosis and diverticulitis. She also reports that she noticed some blood in her stool 2 weeks ago.   Denies cough, chest pain, SOB,  dysuria, urgency, frequency, hematuria.    Past Medical History  Diagnosis Date  . Diabetes mellitus   . Diverticula of intestine   . High cholesterol   . Polycystic ovarian syndrome    Current Outpatient Prescriptions  Medication Sig Dispense Refill  . aspirin (ANACIN) 81 MG EC tablet Take 81 mg by mouth daily. 30 minutes before Niaspan      . Blood Glucose Monitoring Suppl (ONE TOUCH BASIC SYSTEM) W/DEVICE KIT Test blood sugar as directed       . DULoxetine (CYMBALTA) 60 MG capsule Take 60 mg by mouth  daily.      . fenofibrate 160 MG tablet Take 160 mg by mouth daily.      [name] glucose blood test strip Use to check blood sugars three times a day or as directed      . HYDROcodone-acetaminophen (NORCO/VICODIN) 5-325 MG per tablet Take 1 tablet by mouth every 6 (six) hours as needed for pain.  20 tablet  0  . Insulin Pen Needle (PRODIGY MINI PEN NEEDLES) 31G X 5 MM MISC Use as directed       . metFORMIN (GLUCOPHAGE) 1000 MG tablet Take 1,000 mg by mouth 2 (two) times daily with a meal.      . ONETOUCH DELICA LANCETS MISC Use to check blood sugars three times a day or as directed       . promethazine (PHENERGAN) 25 MG tablet Take 1 tablet (25 mg total) by mouth every 6 (six) hours as needed for nausea.  15 tablet  0  . sitaGLIPtin (JANUVIA) 50 MG tablet Take 50 mg by mouth daily.      [name] acetaminophen (TYLENOL) 325 MG tablet Take 2 tablets (650 mg total) by mouth every 4 (four) hours as needed for pain.  100 tablet  2  . ciprofloxacin (CIPRO) 500 MG tablet Take 1 tablet (500 mg total) by mouth 2 (two) times daily.  20 tablet  0  . metroNIDAZOLE (FLAGYL) 500 MG tablet Take 1 tablet (500 mg total) by  mouth 3 (three) times daily.  30 tablet  0   No current facility-administered medications for this visit.   Facility-Administered Medications Ordered in Other Visits  Medication Dose Route Frequency Provider Last Rate Last Dose  . iohexol (OMNIPAQUE) 300 MG/ML solution 100 mL  100 mL Intravenous Once PRN Medication Radiologist, MD   100 mL at 06/13/12 1854  . ketorolac (TORADOL) 30 MG/ML injection 30 mg  30 mg Intravenous Once Benny Lennert, MD   30 mg at 06/12/12 2253  . sodium chloride 0.9 % bolus 1,000 mL  1,000 mL Intravenous Once Benny Lennert, MD   1,000 mL at 06/12/12 2246   No family history on file. History   Social History  . Marital Status: Single    Spouse Name: N/A    Number of Children: N/A  . Years of Education: N/A   Social History Main Topics  . Smoking status: Current  Everyday Smoker -- 0.5 packs/day    Last Attempt to Quit: 01/21/2011  . Smokeless tobacco: None   Comment: In Smoking Cessation Program on patches  . Alcohol Use: Yes     rarely  . Drug Use: No  . Sexually Active: None   Other Topics Concern  . None   Social History Narrative  . None   Review of Systems:  General: has fevers, no chills. Skin: has rash on chest wall, neck and shoulders. HEENT: no blurry vision, hearing changes or sore throat Pulm: no dyspnea, coughing, wheezing CV: no chest pain, palpitations, shortness of breath Abd: has nausea, vomiting, abdominal pain and diarrhea. GU: no dysuria, hematuria, polyuria Ext: no arthralgias, myalgias Neuro: no weakness, numbness, or tingling   Objective:  Physical Exam: Filed Vitals:   06/13/12 1426  BP: 146/88  Pulse: 76  Temp: 99.3 F (37.4 C)  TempSrc: Oral  Height: 5\' 5"  (1.651 m)  Weight: 278 lb 11.2 oz (126.417 kg)  SpO2: 97%   General:  not in acute distress HEENT: PERRL, EOMI, no scleral icterus Cardiac: S1/S2, RRR, No murmurs, gallops or rubs Pulm: Good air movement bilaterally, Clear to auscultation bilaterally, No rales, wheezing, rhonchi or rubs. Abd: Soft,  nondistended, mild tender over lower abdomen, no rebound pain, no organomegaly, BS present Ext: no edema, 2+DP/PT pulse bilaterally Musculoskeletal: No joint deformities, erythema, or stiffness. Skin: has rashes. No skin bruise. Neuro: alert and oriented X3, cranial nerves II-XII grossly intact, muscle strength 5/5 in all extremeties,  sensation to light touch intact.  Psych.: patient is not psychotic, no suicidal or hemocidal ideation.    Assessment & Plan:

## 2012-06-13 NOTE — Assessment & Plan Note (Signed)
>>  ASSESSMENT AND PLAN FOR ESSENTIAL HYPERTENSION WRITTEN ON 06/13/2012  8:18 PM BY Lorretta Harp, MD  Her blood pressure is slightly elevated. Blood pressure is 146/88, which might be due to acute sickness. We'll continue current regimen and followup in 2 weeks.

## 2012-06-15 ENCOUNTER — Other Ambulatory Visit (HOSPITAL_COMMUNITY): Payer: Self-pay | Admitting: Family Medicine

## 2012-06-15 DIAGNOSIS — M25561 Pain in right knee: Secondary | ICD-10-CM

## 2012-06-19 ENCOUNTER — Telehealth: Payer: Self-pay | Admitting: *Deleted

## 2012-06-19 ENCOUNTER — Inpatient Hospital Stay (HOSPITAL_COMMUNITY)
Admission: AD | Admit: 2012-06-19 | Discharge: 2012-06-21 | DRG: 392 | Disposition: A | Discharge status: Home / Self Care | Payer: 59 | Source: Ambulatory Visit | Attending: Internal Medicine | Admitting: Internal Medicine

## 2012-06-19 ENCOUNTER — Ambulatory Visit: Payer: Self-pay | Admitting: Internal Medicine

## 2012-06-19 ENCOUNTER — Encounter: Payer: Self-pay | Admitting: Internal Medicine

## 2012-06-19 ENCOUNTER — Inpatient Hospital Stay (HOSPITAL_COMMUNITY): Admission: RE | Admit: 2012-06-19 | Payer: Self-pay | Source: Ambulatory Visit

## 2012-06-19 ENCOUNTER — Ambulatory Visit (INDEPENDENT_AMBULATORY_CARE_PROVIDER_SITE_OTHER): Payer: 59 | Admitting: Internal Medicine

## 2012-06-19 VITALS — BP 137/80 | HR 66 | Temp 97.8°F | Ht 65.0 in | Wt 275.4 lb

## 2012-06-19 DIAGNOSIS — R51 Headache: Secondary | ICD-10-CM | POA: Diagnosis present

## 2012-06-19 DIAGNOSIS — Z860100 Personal history of colon polyps, unspecified: Secondary | ICD-10-CM

## 2012-06-19 DIAGNOSIS — G4730 Sleep apnea, unspecified: Secondary | ICD-10-CM

## 2012-06-19 DIAGNOSIS — F329 Major depressive disorder, single episode, unspecified: Secondary | ICD-10-CM | POA: Diagnosis present

## 2012-06-19 DIAGNOSIS — Z90710 Acquired absence of both cervix and uterus: Secondary | ICD-10-CM | POA: Diagnosis present

## 2012-06-19 DIAGNOSIS — K5792 Diverticulitis of intestine, part unspecified, without perforation or abscess without bleeding: Secondary | ICD-10-CM

## 2012-06-19 DIAGNOSIS — K5732 Diverticulitis of large intestine without perforation or abscess without bleeding: Principal | ICD-10-CM

## 2012-06-19 DIAGNOSIS — E781 Pure hyperglyceridemia: Secondary | ICD-10-CM | POA: Diagnosis present

## 2012-06-19 DIAGNOSIS — E669 Obesity, unspecified: Secondary | ICD-10-CM | POA: Diagnosis present

## 2012-06-19 DIAGNOSIS — E282 Polycystic ovarian syndrome: Secondary | ICD-10-CM

## 2012-06-19 DIAGNOSIS — K649 Unspecified hemorrhoids: Secondary | ICD-10-CM

## 2012-06-19 DIAGNOSIS — B370 Candidal stomatitis: Secondary | ICD-10-CM | POA: Diagnosis present

## 2012-06-19 DIAGNOSIS — K625 Hemorrhage of anus and rectum: Secondary | ICD-10-CM

## 2012-06-19 DIAGNOSIS — E1165 Type 2 diabetes mellitus with hyperglycemia: Secondary | ICD-10-CM | POA: Diagnosis present

## 2012-06-19 DIAGNOSIS — F3289 Other specified depressive episodes: Secondary | ICD-10-CM | POA: Diagnosis present

## 2012-06-19 DIAGNOSIS — R519 Headache, unspecified: Secondary | ICD-10-CM | POA: Diagnosis not present

## 2012-06-19 DIAGNOSIS — E119 Type 2 diabetes mellitus without complications: Secondary | ICD-10-CM

## 2012-06-19 DIAGNOSIS — F32A Depression, unspecified: Secondary | ICD-10-CM

## 2012-06-19 DIAGNOSIS — I152 Hypertension secondary to endocrine disorders: Secondary | ICD-10-CM | POA: Insufficient documentation

## 2012-06-19 DIAGNOSIS — R112 Nausea with vomiting, unspecified: Secondary | ICD-10-CM

## 2012-06-19 DIAGNOSIS — E039 Hypothyroidism, unspecified: Secondary | ICD-10-CM | POA: Diagnosis present

## 2012-06-19 DIAGNOSIS — G4733 Obstructive sleep apnea (adult) (pediatric): Secondary | ICD-10-CM | POA: Diagnosis present

## 2012-06-19 DIAGNOSIS — Z9889 Other specified postprocedural states: Secondary | ICD-10-CM

## 2012-06-19 DIAGNOSIS — Z794 Long term (current) use of insulin: Secondary | ICD-10-CM | POA: Diagnosis present

## 2012-06-19 DIAGNOSIS — I10 Essential (primary) hypertension: Secondary | ICD-10-CM

## 2012-06-19 DIAGNOSIS — K620 Anal polyp: Secondary | ICD-10-CM | POA: Diagnosis present

## 2012-06-19 LAB — CBC WITH DIFFERENTIAL/PLATELET
Basophils Absolute: 0 10*3/uL (ref 0.0–0.1)
Basophils Relative: 1 % (ref 0–1)
Eosinophils Relative: 2 % (ref 0–5)
Lymphocytes Relative: 30 % (ref 12–46)
MCV: 87.6 fL (ref 78.0–100.0)
Neutro Abs: 3.7 10*3/uL (ref 1.7–7.7)
Platelets: 163 10*3/uL (ref 150–400)
RDW: 13.4 % (ref 11.5–15.5)
WBC: 6.4 10*3/uL (ref 4.0–10.5)

## 2012-06-19 LAB — GLUCOSE, CAPILLARY
Glucose-Capillary: 171 mg/dL — ABNORMAL HIGH (ref 70–99)
Glucose-Capillary: 104 mg/dL — ABNORMAL HIGH (ref 70–99)

## 2012-06-19 LAB — COMPREHENSIVE METABOLIC PANEL
ALT: 21 U/L (ref 0–35)
AST: 22 U/L (ref 0–37)
Alkaline Phosphatase: 53 U/L (ref 39–117)
Calcium: 9.8 mg/dL (ref 8.4–10.5)
Chloride: 100 mEq/L (ref 96–112)
Creat: 0.61 mg/dL (ref 0.50–1.10)

## 2012-06-19 MED ORDER — SODIUM CHLORIDE 0.9 % IV SOLN
INTRAVENOUS | Status: DC
  Administered 2012-06-19 – 2012-06-21 (×4): via INTRAVENOUS

## 2012-06-19 MED ORDER — SODIUM CHLORIDE 0.9 % IV SOLN
3.0000 g | Freq: Four times a day (QID) | INTRAVENOUS | Status: DC
  Administered 2012-06-19 – 2012-06-20 (×4): 3 g via INTRAVENOUS
  Filled 2012-06-19 (×7): qty 3

## 2012-06-19 MED ORDER — PROMETHAZINE HCL 25 MG/ML IJ SOLN
12.5000 mg | Freq: Once | INTRAMUSCULAR | Status: AC
  Administered 2012-06-19: 12.5 mg via INTRAVENOUS
  Filled 2012-06-19: qty 1

## 2012-06-19 MED ORDER — DULOXETINE HCL 60 MG PO CPEP
60.0000 mg | ORAL_CAPSULE | Freq: Every day | ORAL | Status: DC
  Administered 2012-06-19 – 2012-06-21 (×3): 60 mg via ORAL
  Filled 2012-06-19 (×3): qty 1

## 2012-06-19 MED ORDER — SODIUM CHLORIDE 0.9 % IV BOLUS (SEPSIS): 1000.0000 mL | Freq: Once | INTRAVENOUS | Status: DC

## 2012-06-19 MED ORDER — MAGIC MOUTHWASH
10.0000 mL | Freq: Three times a day (TID) | ORAL | Status: DC
  Administered 2012-06-19 – 2012-06-21 (×4): 10 mL via ORAL
  Filled 2012-06-19 (×8): qty 10

## 2012-06-19 MED ORDER — ONDANSETRON HCL 4 MG/2ML IJ SOLN
4.0000 mg | Freq: Four times a day (QID) | INTRAMUSCULAR | Status: DC
  Administered 2012-06-19 – 2012-06-20 (×3): 4 mg via INTRAVENOUS
  Filled 2012-06-19 (×3): qty 2

## 2012-06-19 MED ORDER — HYDROMORPHONE HCL PF 1 MG/ML IJ SOLN
1.0000 mg | INTRAMUSCULAR | Status: DC | PRN
  Administered 2012-06-20: 1 mg via INTRAVENOUS
  Filled 2012-06-19: qty 1

## 2012-06-19 MED ORDER — FENOFIBRATE 160 MG PO TABS
160.0000 mg | ORAL_TABLET | Freq: Every day | ORAL | Status: DC
  Administered 2012-06-19 – 2012-06-21 (×3): 160 mg via ORAL
  Filled 2012-06-19 (×3): qty 1

## 2012-06-19 MED ORDER — INSULIN ASPART 100 UNIT/ML ~~LOC~~ SOLN
0.0000 [IU] | Freq: Three times a day (TID) | SUBCUTANEOUS | Status: DC
  Administered 2012-06-20: 1 [IU] via SUBCUTANEOUS

## 2012-06-19 NOTE — Telephone Encounter (Addendum)
Pt calls crying stating she has lost 16 lbs in the last week and is continuing to vomit regularly, has vomited 4 to 5 times in the last 24hrs. She states she cannot take much more, states she feels lousy. She is cancelling radiology appt this pm to see md here in clinic, she will reschedule the radiology appt She has now resch rad appt and will be seen here today at 1415 per sharonb. Dr Tonny Branch

## 2012-06-19 NOTE — Progress Notes (Signed)
Admitted to room 6 N 19 with a diagnosis of Diverticulitis. MD in for visit. Patient alert and oriented, oriented to unit and staff. Vital signs recorded. Will continue to monitor.

## 2012-06-19 NOTE — Assessment & Plan Note (Signed)
CT scan done on 9/3 showed early mild diverticulitis without abscess formation.  She has been able to take the antibiotics but has been vomiting so there is a question if she has been able to treat this adequately.  She is currently not febrile but looks miserable and has some continued abdominal pain.  She has failed outpatient treatment and needs further work up.  We will check a Cmet, and a CBC since her last CBC showed neutropenia and thrombocytopenia which are concerning for other infectious causes for diarrhea.  She is having bloody diarrhea as well so differential diagnosis could also include C. Diff as well as E. Coli O157:H7 or salmonella.  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    Plan: Admit for observation and testing. CBC with diff Cmet NS bolus 1L

## 2012-06-19 NOTE — Progress Notes (Signed)
Patient on zofran IV every 6 hours but still complaining of nausea, MD made aware.

## 2012-06-19 NOTE — Progress Notes (Signed)
Subjective:   Patient ID: Renee Ho female   DOB: Dec 29, 1975 36 y.o.   MRN: [id number]  HPI: [name] is a 36 y.o. woman who presents to clinic today for follow up from her last appointment.  She was seen on 9/3 by Dr. Clyde Ho and diagnosed with diverticulitis.  She was sent home on Cipro and Flagyl.  She states that this all started on August 30th when she started to have nausea and vomiting and mild diarrhea.  She has not been able to eat a normal diet since then and has only been able to keep down minimal amounts of fluids.  She has also had fever and abdominal pain.  Fevers were up to 102.83F with the last on 9/4.  The abdominal pain is in the bilateral lower quadrants and she describes it as a burning pain that gets crampy and worse just before she has to have a bowel movement.  She notes tenesmus.  After the BM the abdominal pain "eases off some."  She has been able to take the Cipro and flagyl but notes that she has had white plaques on her tongue and inside of her cheeks since starting the antibiotics.  She has vomited 3 times today as well.  She denies blood in the vomitus, decreased urination.  She also states that she has lost 11 lbs since this all began on 8/30.  Past Medical History  Diagnosis Date  . Diabetes mellitus   . Diverticula of intestine   . High cholesterol   . Polycystic ovarian syndrome    Current Outpatient Prescriptions  Medication Sig Dispense Refill  . acetaminophen (TYLENOL) 325 MG tablet Take 2 tablets (650 mg total) by mouth every 4 (four) hours as needed for pain.  100 tablet  2  . aspirin (ANACIN) 81 MG EC tablet Take 81 mg by mouth daily. 30 minutes before Niaspan      . Blood Glucose Monitoring Suppl (ONE TOUCH BASIC SYSTEM) W/DEVICE KIT Test blood sugar as directed       . ciprofloxacin (CIPRO) 500 MG tablet Take 1 tablet (500 mg total) by mouth 2 (two) times daily.  20 tablet  0  . DULoxetine (CYMBALTA) 60 MG capsule Take 60 mg by mouth daily.        . fenofibrate 160 MG tablet Take 160 mg by mouth daily.      [name] glucose blood test strip Use to check blood sugars three times a day or as directed      . HYDROcodone-acetaminophen (NORCO/VICODIN) 5-325 MG per tablet Take 1 tablet by mouth every 6 (six) hours as needed for pain.  20 tablet  0  . Insulin Pen Needle (PRODIGY MINI PEN NEEDLES) 31G X 5 MM MISC Use as directed       . metFORMIN (GLUCOPHAGE) 1000 MG tablet Take 1,000 mg by mouth 2 (two) times daily with a meal.      . metroNIDAZOLE (FLAGYL) 500 MG tablet Take 1 tablet (500 mg total) by mouth 3 (three) times daily.  30 tablet  0  . ONETOUCH DELICA LANCETS MISC Use to check blood sugars three times a day or as directed       . promethazine (PHENERGAN) 25 MG tablet Take 1 tablet (25 mg total) by mouth every 6 (six) hours as needed for nausea.  15 tablet  0  . sitaGLIPtin (JANUVIA) 50 MG tablet Take 50 mg by mouth daily.       No family history  on file. History   Social History  . Marital Status: Single    Spouse Name: N/A    Number of Children: N/A  . Years of Education: N/A   Social History Main Topics  . Smoking status: Former Smoker -- 0.5 packs/day    Quit date: 01/21/2011  . Smokeless tobacco: Not on file   Comment: In Smoking Cessation Program on patches  . Alcohol Use: Yes     rarely  . Drug Use: No  . Sexually Active: Not on file   Other Topics Concern  . Not on file   Social History Narrative  . No narrative on file   Review of Systems: Negative except as noted in the HPI.   Objective:  Physical Exam: Filed Vitals:   06/19/12 1427  BP: 137/80  Pulse: 66  Temp: 97.8 F (36.6 C)  TempSrc: Oral  Height: 5\' 5"  (1.651 m)  Weight: 275 lb 6.4 oz (124.921 kg)  SpO2: 95%   Constitutional: Vital signs reviewed.  Patient is a well-developed and well-nourished obese woman in no acute distress and cooperative with exam. Alert and oriented x3.  Head: Normocephalic and atraumatic Ear: TM normal  bilaterally Mouth: There are several white plaques noted on the tongue and buccal surfaces.  no erythema, Dry mucus membranes Eyes: PERRL, EOMI, conjunctivae normal, No scleral icterus.  Neck: Supple, Trachea midline normal ROM, No JVD, mass, thyromegaly, or carotid bruit present.  Cardiovascular: RRR, S1 normal, S2 normal, no MRG, pulses symmetric and intact bilaterally Pulmonary/Chest: CTAB, no wheezes, rales, or rhonchi Abdominal: Soft. Moderate tenderness to deep palpation in the LLQ and RLQ, no rebound tenderness.  non-distended, bowel sounds are normal. no masses, organomegaly, or guarding present.  GU: no CVA tenderness Musculoskeletal: No joint deformities, erythema, or stiffness, ROM full and no nontender Hematology: no cervical, inginal, or axillary adenopathy.  Neurological: A&O x3, Strength is normal and symmetric bilaterally, cranial nerve II-XII are grossly intact, no focal motor deficit, sensory intact to light touch bilaterally.  Skin: Warm, dry and intact. No rash, cyanosis, or clubbing.  Psychiatric: Normal mood and affect. speech and behavior is normal. Judgment and thought content normal. Cognition and memory are normal.   Assessment & Plan:  Patient was discussed with Dr. Criselda Peaches and the decision was made to call for admission.  45 minutes were spent with this patient in coordination and care as well as arranging for admission.

## 2012-06-19 NOTE — Assessment & Plan Note (Signed)
She appears to have thrush in her mouth that likely is secondary to the antibiotic use.  I will leave this to the inpatient team for management but would suggest swish and spit nystatin.

## 2012-06-19 NOTE — Progress Notes (Signed)
1000cc NS bolus at 4:20PM per Dr Tonny Branch. Report was given to nurse on 6N room 19 and was taken via w/c with daughter and belongings at 4:50PM.  Stanton Kidney Timara Loma RN 06/19/12 5PM

## 2012-06-19 NOTE — Patient Instructions (Signed)
We will get you admitted and feeling better.

## 2012-06-19 NOTE — H&P (Signed)
Hospital Admission Note Date: 06/19/2012  Patient name: Renee Ho Medical record number: [id number] Date of birth: [birth year] Age: 36 y.o. Gender: female PCP: Kristie Cowman, MD  Medical Service: IMTS  Attending physician:  Dr. Josem Kaufmann    1st Contact: Dr. Burtis Junes   Pager: [phone] 2nd Contact: Dr. Bosie Clos   Pager: [phone] After 5 pm or weekends: 1st Contact:      Pager: [phone] 2nd Contact:      Pager: [phone]  Chief Complaint: Diarrhea and fever  History of Present Illness: 36 y.o. Caucasian woman with pmh PCOS s/p hysterectomy and not on hormonal theraphy, DM, and hx of diverticulitis who originally presented to clinic today for follow up from her last appointment. She was seen on 9/3 by Dr. Clyde Lundborg and diagnosed with diverticulitis. She was sent home on Cipro and Flagyl and was compliant with that medication and completed a 6 day course. She states that episodes of diarrhea, nausea, daily vomiting, and fevers into 102 measured at home all started on August 30th. This was after a trip to the beach on 10-18, she denied any exposure to sick contacts or kids. She has not been able to eat a normal diet since then and has only been able to keep down minimal amounts of fluids w/o vomiting and abdominal pain. The abdominal pain is in the bilateral lower quadrants and she describes it as a "burning pain like a fire" that gets crampy and worse just before she has to have a bowel movement. She notes tenesmus. After the BM the abdominal pain "eases off some." She is now passing frank BRBPR and some clots over the past few days but no mucus. She has been able to take the Cipro and flagyl but notes that she has had white plaques on her tongue and inside of her cheeks since starting the antibiotics. She has vomited 3 times today as well. She denies blood in the vomitus, decreased urination, dysuria, back pain, pyuria, HA, new rashes, new joint pain, blurry vision, or orthostasis. Her BGs at home have been in the  150-200s despite not eating. She also states that she has had unintentional 11 lb weight loss since this all began on 8/30.   Meds: Medications Prior to Admission  Medication Sig Dispense Refill  . acetaminophen (TYLENOL) 325 MG tablet Take 650 mg by mouth every 4 (four) hours as needed. For pain.      [name] aspirin EC 81 MG tablet Take 81 mg by mouth daily.      . ciprofloxacin (CIPRO) 500 MG tablet Take 1 tablet (500 mg total) by mouth 2 (two) times daily.  20 tablet  0  . ciprofloxacin (CIPRO) 500 MG tablet Take 500 mg by mouth 2 (two) times daily.      . DULoxetine (CYMBALTA) 60 MG capsule Take 60 mg by mouth daily.      . fenofibrate 160 MG tablet Take 160 mg by mouth daily.      . metFORMIN (GLUCOPHAGE) 500 MG tablet Take 1,000 mg by mouth 2 (two) times daily with a meal.      . metroNIDAZOLE (FLAGYL) 500 MG tablet Take 500 mg by mouth 3 (three) times daily.      . sitaGLIPtin (JANUVIA) 50 MG tablet Take 50 mg by mouth daily.        Allergies: Allergies as of 06/19/2012 - Review Complete 06/19/2012  Allergen Reaction Noted  . Metoclopramide hcl    . Morphine Other (See Comments) 02/27/2007  . Toradol (  ketorolac tromethamine) Rash 06/19/2012   Past Medical History  Diagnosis Date  . Diabetes mellitus   . Diverticula of intestine   . High cholesterol   . Polycystic ovarian syndrome    Past Surgical History  Procedure Date  . Abdominal hysterectomy   . Ovarian cyst removal    No family history on file. History   Social History  . Marital Status: Single    Spouse Name: N/A    Number of Children: N/A  . Years of Education: N/A   Occupational History  . Not on file.   Social History Main Topics  . Smoking status: Former Smoker -- 0.5 packs/day    Quit date: 01/21/2011  . Smokeless tobacco: Not on file   Comment: In Smoking Cessation Program on patches  . Alcohol Use: Yes     rarely  . Drug Use: No  . Sexually Active: Not on file   Other Topics Concern  . Not on  file   Social History Narrative  . No narrative on file    Review of Systems: Pertinent items are noted in HPI.  Physical Exam: Blood pressure 158/82, pulse 63, temperature 98.5 F (36.9 C), resp. rate 18, SpO2 98.00%. General: resting in bed HEENT: PERRL, EOMI, no scleral icterus Cardiac: RRR, no rubs, murmurs or gallops Pulm: clear to auscultation bilaterally, moving normal volumes of air Abd: soft, obese, LLQ tenderness greater than RLQ, no organomegaly, nondistended, BS present Ext: warm and well perfused, no pedal edema Neuro: alert and oriented X3, cranial nerves II-XII grossly intact  Lab results: Basic Metabolic Panel:  Basename 06/19/12 1549  NA 137  K 3.9  CL 100  CO2 28  GLUCOSE 154*  BUN 13  CREATININE 0.61  CALCIUM 9.8  MG --  PHOS --   Liver Function Tests:  Basename 06/19/12 1549  AST 22  ALT 21  ALKPHOS 53  BILITOT 0.5  PROT 7.1  ALBUMIN 3.8   CBC:  Basename 06/19/12 1549  WBC 6.4  NEUTROABS 3.7  HGB 14.5  HCT 41.8  MCV 87.6  PLT 163   CBG:  Basename 06/19/12 1435  GLUCAP 171*   CT 06/14/12 IMPRESSION:  1. Suspect early/uncomplicated diverticulitis involving the  mid/distal sigmoid colon. No evidence of perforation or drainable  fluid collection. If not recently performed, correlation with  colonoscopy in the nonacute setting is recommended.  2. Post hysterectomy. Remaining left adnexal tissue is grossly  stable in size since the 2005 examination.   Assessment & Plan by Problem: 36 y.o. Caucasian woman with pmh PCOS s/p hysterectomy and not on hormonal theraphy, DM, and hx of diverticulitis p/w recurrent diarrhea, vomiting, and fever.  1. Complicated Acute Diverticulitis: Pt CT imaging 9/3 showed early/uncomplicated diverticulitis involving mid/distal sigmoid and pt was started on cipro and flagyl. It appears pt failed this tx or 2/2 to nausea. Pt was given phenergan after an ED visit with little relief. Continued to have fevers.  done thus far. Pt does meet SIRS 2/4 (leukopenia, febrile). Pt reports BRBPR for past couple of days. Seems all events stem after visit to the beach will look into other infectious causes.  -c.diff, giardia/crypto, O&P, stool culture, BCx all pending -NPO and sips w/meds -IV Unasyn -IV dilaudid prn pain -BMet, CBC in AM -IV zofran and phenergan prn for nausea -NS 75 cc/hr -contact for possible C.diff  2. DM: last HgbA1c 8.6 7/13 -held metformin -CBGs q4h -SSI   3. Depression: stable pt doesn't endorse SI or HI  at this time -continue cymbalta   4. OSA: pt uses CPAP at home  5.DVT pptx: SCDs 2/2 BRBPR  Signed: Christen Bame 06/19/2012, 7:34 PM

## 2012-06-19 NOTE — Telephone Encounter (Signed)
Looks like he rad appt was for ortho so Ok to resch that. She can be seen here if we have an open appt, Thanks

## 2012-06-20 DIAGNOSIS — R112 Nausea with vomiting, unspecified: Secondary | ICD-10-CM | POA: Diagnosis present

## 2012-06-20 DIAGNOSIS — E119 Type 2 diabetes mellitus without complications: Secondary | ICD-10-CM

## 2012-06-20 DIAGNOSIS — R519 Headache, unspecified: Secondary | ICD-10-CM | POA: Diagnosis not present

## 2012-06-20 DIAGNOSIS — F329 Major depressive disorder, single episode, unspecified: Secondary | ICD-10-CM

## 2012-06-20 DIAGNOSIS — K5732 Diverticulitis of large intestine without perforation or abscess without bleeding: Principal | ICD-10-CM

## 2012-06-20 DIAGNOSIS — Z86010 Personal history of colonic polyps: Secondary | ICD-10-CM

## 2012-06-20 DIAGNOSIS — K649 Unspecified hemorrhoids: Secondary | ICD-10-CM | POA: Diagnosis present

## 2012-06-20 DIAGNOSIS — Z90710 Acquired absence of both cervix and uterus: Secondary | ICD-10-CM | POA: Diagnosis present

## 2012-06-20 DIAGNOSIS — G4733 Obstructive sleep apnea (adult) (pediatric): Secondary | ICD-10-CM

## 2012-06-20 LAB — GLUCOSE, CAPILLARY
Glucose-Capillary: 125 mg/dL — ABNORMAL HIGH (ref 70–99)
Glucose-Capillary: 110 mg/dL — ABNORMAL HIGH (ref 70–99)
Glucose-Capillary: 105 mg/dL — ABNORMAL HIGH (ref 70–99)

## 2012-06-20 LAB — CBC
Hemoglobin: 13.5 g/dL (ref 12.0–15.0)
MCV: 88.2 fL (ref 78.0–100.0)
Platelets: 138 10*3/uL — ABNORMAL LOW (ref 150–400)
RBC: 4.51 MIL/uL (ref 3.87–5.11)
WBC: 6.5 10*3/uL (ref 4.0–10.5)

## 2012-06-20 LAB — BASIC METABOLIC PANEL
CO2: 29 mEq/L (ref 19–32)
Calcium: 8.6 mg/dL (ref 8.4–10.5)
Potassium: 4.1 mEq/L (ref 3.5–5.1)
Sodium: 141 mEq/L (ref 135–145)

## 2012-06-20 LAB — CLOSTRIDIUM DIFFICILE BY PCR: Toxigenic C. Difficile by PCR: NEGATIVE

## 2012-06-20 LAB — OVA AND PARASITE EXAMINATION

## 2012-06-20 LAB — GIARDIA/CRYPTOSPORIDIUM SCREEN(EIA)

## 2012-06-20 MED ORDER — PROMETHAZINE HCL 25 MG PO TABS
12.5000 mg | ORAL_TABLET | Freq: Four times a day (QID) | ORAL | Status: DC | PRN
  Administered 2012-06-20 – 2012-06-21 (×2): 12.5 mg via ORAL
  Filled 2012-06-20 (×2): qty 1

## 2012-06-20 MED ORDER — PROMETHAZINE HCL 25 MG RE SUPP: 12.5000 mg | Freq: Four times a day (QID) | RECTAL | Status: DC | PRN

## 2012-06-20 MED ORDER — AMOXICILLIN-POT CLAVULANATE 875-125 MG PO TABS
1.0000 | ORAL_TABLET | Freq: Two times a day (BID) | ORAL | Status: DC
  Administered 2012-06-20 – 2012-06-21 (×2): 1 via ORAL
  Filled 2012-06-20 (×4): qty 1

## 2012-06-20 MED ORDER — PROMETHAZINE HCL 25 MG/ML IJ SOLN
12.5000 mg | Freq: Four times a day (QID) | INTRAMUSCULAR | Status: DC | PRN
  Administered 2012-06-20: 12.5 mg via INTRAVENOUS
  Filled 2012-06-20: qty 1

## 2012-06-20 MED ORDER — HYDROCODONE-ACETAMINOPHEN 5-325 MG PO TABS
1.0000 | ORAL_TABLET | ORAL | Status: DC | PRN
  Administered 2012-06-20: 1 via ORAL
  Administered 2012-06-21: 2 via ORAL
  Filled 2012-06-20: qty 2
  Filled 2012-06-20: qty 1

## 2012-06-20 MED ORDER — ACETAMINOPHEN 325 MG PO TABS
650.0000 mg | ORAL_TABLET | Freq: Four times a day (QID) | ORAL | Status: DC | PRN
  Administered 2012-06-20 – 2012-06-21 (×2): 650 mg via ORAL
  Filled 2012-06-20 (×2): qty 2

## 2012-06-20 NOTE — Progress Notes (Signed)
MD on call made aware of patients nausea. No new order at this time.

## 2012-06-20 NOTE — Progress Notes (Signed)
Medical Student Daily Progress Note  Subjective: The patient is a 36 year old woman with a PMH of OSA, HLD, DM, HTN, PCOS, hypothyroidism, and sigmoid diverticulitis who was admitted yesterday for acute diverticulitis. She still complains of nausea for which she is receiving Zofran. She is requesting Phenergan because she said it helped her while she was in the ED yesterday. She denies any vomiting since admission. She has not had any additional bowel movements. She reports her abdominal pain is improved with the dilaudid, however, she has RLQ and LLQ pain. She also complains of a headache when she woke up this morning. She describes it as being localized to two areas on the lower posterior aspect of her head and she feels two "knots." It is non-radiating; it worsens with palpation. She says she was compliant with her BiPAP overnight.   Objective: Vital signs in last 24 hours: Filed Vitals:   06/19/12 1703 06/19/12 2135 06/20/12 0135 06/20/12 0623  BP: 158/82 119/56 128/69 107/61  Pulse: 63 58 60 55  Temp: 98.5 F (36.9 C) 97.9 F (36.6 C) 97.7 F (36.5 C) 97.6 F (36.4 C)  TempSrc:  Oral Oral Oral  Resp: 18 16 16 16   SpO2: 98% 99% 97% 98%    Intake/Output Summary (Last 24 hours) at 06/20/12 1258 Last data filed at 06/20/12 0612  Gross per 24 hour  Intake   2135 ml  Output      0 ml  Net   2135 ml   Physical Exam: Gen: laying in bed with BiPAP. No acute distress. Appears to be comfortable.  HEENT: Normocephalic. 2cm round circular protrusion on the posterior aspect of the left neck. Not fluctuant, no redness, erythema. Tender on palpation. Sclera clear. Normal nares.   CV: RRR. No murmurs, gallops or rubs.  RESP: CTAB. No wheezes, rhonchi or rales. ABD: Hyperactive bowel sounds. Soft, obese. Slightly tender in the LLQ and RLQ. No rebound, guarding. Extrem: Pulses 2+. No LE edema.   Lab Results: Basic Metabolic Panel:  Lab 06/20/12 4540 06/19/12 1549  NA 141 137  K 4.1 3.9    CL 104 100  CO2 29 28  GLUCOSE 129* 154*  BUN 10 13  CREATININE 0.68 0.61  CALCIUM 8.6 9.8  MG -- --  PHOS -- --   Liver Function Tests:  Lab 06/19/12 1549  AST 22  ALT 21  ALKPHOS 53  BILITOT 0.5  PROT 7.1  ALBUMIN 3.8   CBC:  Lab 06/20/12 0540 06/19/12 1549  WBC 6.5 6.4  NEUTROABS -- 3.7  HGB 13.5 14.5  HCT 39.8 41.8  MCV 88.2 87.6  PLT 138* 163   CBG:  Lab 06/20/12 1211 06/20/12 0812 06/19/12 2125 06/19/12 1435 06/13/12 1434  GLUCAP 110* 125* 104* 171* 166*   Micro Results: Recent Results (from the past 240 hour(s))  CLOSTRIDIUM DIFFICILE BY PCR     Status: Normal   Collection Time   06/19/12  6:28 PM      Component Value Range Status Comment   C difficile by pcr NEGATIVE  NEGATIVE Final    Medications: I have reviewed the patient's current medications. Scheduled Meds:   . ampicillin-sulbactam (UNASYN) IV  3 g Intravenous Q6H  . DULoxetine  60 mg Oral Daily  . fenofibrate  160 mg Oral Daily  . insulin aspart  0-9 Units Subcutaneous TID WC  . magic mouthwash  10 mL Oral TID  . promethazine  12.5 mg Intravenous Once  . DISCONTD: ondansetron (ZOFRAN)  IV  4 mg Intravenous Q6H   Continuous Infusions:   . sodium chloride 100 mL/hr at 06/20/12 0731   PRN Meds:.acetaminophen, HYDROmorphone (DILAUDID) injection, promethazine, promethazine, promethazine Assessment/Plan: The patient is a 36 year old woman with a PMH of OSA, HLD, DM, HTN, PCOS, hypothyroidism, and sigmoid diverticulitis who was admitted yesterday for acute diverticulitis.  1. Acute diverticulitis: The patient is currently on day 2 of ampicillin-sulbactam. On exam, the patient is afebrile with vitals within normal limits. She does not have a significant pain on exam, but she did receive dilaudid prior to this. She is still complaining of nausea which we will treat with Phenergan. However, she denies any additional vomiting or bloody bowel movements since admission. Her CBC and BMET is unremarkable.  In regards to her infectious work-up, C. Difficile PCR was negative. Ova and parasites, stool cultures, blood cultures x 2 are pending. She still is receiving IV maintenance fluids as well as tolerating liquids. She will advance her to a regular diet. We will also transition her to po pain medications with hydrocodone-acetaminophen. We will also transition her antibiotics to augmentin for 10 days.    2. Hemorrhoids: The patient likely had bleeding from the rectum 2/2 to hemorrhoids. This is consistent with the bloody bowel movements she has previously experienced in association with her hemorrhoids. She denies any bleeding per rectum today. We will discharge her home with a bowel regimen to help with the hemorrhoids as well as diverticulosis.   3. Type II DM: The patient's most recent HgbA1C on 7/13 was 8.6. Her BGs are slightly elevated. We will place her on a SSI and hold her metformin and [place].  4. Depression: Will continue cymbalta.   5. HLD: will hold fenofibrate during hospitalization  6. OSA: Patient is using BiPAP at night.   7. Headache: The patient developed a headache this morning which she notes is located in two distinct areas on the posterior aspect of her neck. There is a slight 2cm soft, protrusion on the left side of her head which appears to be consistent with a muscle strain. The patient is already receiving pain medication and also received Tylenol this morning for her headache. We will encourage her to be out of bed as tolerated to help with this.   Disposition: Once patient can transition to po medications and a regular diet, pt can be discharged home.     LOS: 1 day   This is a Psychologist, occupational Note.  The care of the patient was discussed with Dr. Bosie Clos and the assessment and plan formulated with their assistance.  Please see their attached note for official documentation of the daily encounter.  Sesilia Poucher, Hayden Pedro 06/20/2012, 12:58 PM

## 2012-06-20 NOTE — Progress Notes (Signed)
Resident Addendum to Medical Student Note   I have seen and examined the patient, and agree with the the medical student assessment and plan outlined above. Please see my brief note below for additional details.  S: Resting comfortably with boyfriend at bedside, reports pain controlled, afebrile without leukocytosis since admission   OBJECTIVE: VS: Reviewed  Meds: Reviewed  Labs: Reviewed  Imaging: Reviewed   Physical Exam: General: Well-developed, obese, resting comfortably, nontoxic appearing,  in no acute distress; alert, appropriate and cooperative throughout examination.  HEENT: Normocephalic, atraumatic  Lungs:  Normal respiratory effort. Clear to auscultation BL without crackles or wheezes.  Heart: RRR. S1 and S2 normal without gallop, murmur, or rubs.  Abdomen:  BS normoactive. Soft, obese, suprapubic tenderness to palpation   Extremities: No pretibial edema.     ASSESSMENT/ PLAN: Pt is a 36 y.o. yo female with a PMHx of diverticulosis, Diabetes Mellitus, PCOS and Depression who was admitted on 06/19/2012 with symptoms of nausea, vomiting, lower abdominal pain and hematochezia which was determined to be secondary to diverticulitis.   1. Mild uncomplicated diverticulitis: Diagnosed with sigmoid diverticulosis in 2005 with colonoscopy after presenting with abdominal pain and rectal bleeding. Pathology showed 40 cm polyp with no high grade dysplasia or malignancy and rectal polyp (no adenomatous change or malignancy noted) Her nausea, vomiting and increased stool frequency has improved with bowel rest and IV hydration.  She is tolerating oral liquids and she has been managed with IV Dilaudid, broad spectrum antibiotic of IV Zosyn and Phenergan    Pt states that she has blood in her stools chronically from hemmoroids.  No electrolyte abnormalities or leukocytosis since admission. Pt will be transitioned to Augmentin for 7-10d course, short course of Vicodin prn and oral phenergan prn.   Counseled on need for liquid diet with advancement as tolerated to high fiber diet.  -if symptoms worsening may consider CT to assess for possible microperforation  Lab 06/20/12 0540 06/19/12 1549  WBC 6.5 6.4     2. Diabetes Mellitus: poorly controlled with HgbA1c 8.6.  Pt on Metformin and Januvia with some issues of noncompliance to her regimen, currently being held. -will continue to monitor CBGs and cover with SSI  CBG (last 3)   Basename 06/20/12 1211 06/20/12 0812 06/19/12 2125  GLUCAP 110* 125* 104*         3. Hemorrhoids: needs to be on high fiber diet and consistent bowel regimen on discharge, this has been discussed with the patient.        4. PCOS: on metformin, stable        5. Depression: stable  -Cont Cymbalta          6. OSA: managed with NIPPV at home, currently on CPAP while hospital         7. Hypertriglyceridemia: on fenofibrate Lipid Panel     Component Value Date/Time   CHOL 181 12/29/2011 1645   TRIG 543* 12/29/2011 1645   HDL 28* 12/29/2011 1645   CHOLHDL 6.5 12/29/2011 1645   VLDL NOT CALC 12/29/2011 1645   LDLCALC Comment:   Not calculated due to Triglyceride >400. Suggest ordering Direct LDL (Unit Code: 16109).   Total Cholesterol/HDL Ratio:CHD Risk                        Coronary Heart Disease Risk Table  Men       Women          1/2 Average Risk              3.4        3.3              Average Risk              5.0        4.4           2X Average Risk              9.6        7.1           3X Average Risk             23.4       11.0 Use the calculated Patient Ratio above and the CHD Risk table  to determine the patient's CHD Risk. ATP III Classification (LDL):       < 100        mg/dL         Optimal      045 - 129     mg/dL         Near or Above Optimal      130 - 159     mg/dL         Borderline High      160 - 189     mg/dL         High       > 409        mg/dL         Very High   05/21/9146 1645           8.  Disposition: improving symptomatically and tolerating liquids.  Will transition to oral therapy with anticipation for            discharge tomorrow.  Follow-up scheduled for Sept 16, 2013 at 3:15 in [facility].  At that time will        address need for GI follow-up.  Length of Stay: 1   Kristie Cowman, MD PGY2, Internal Medicine Resident 06/20/2012, 3:47 PM

## 2012-06-20 NOTE — Progress Notes (Signed)
Pt puts self on cpap when ready and has brought in her own mask and tubing.

## 2012-06-20 NOTE — H&P (Signed)
Internal Medicine Attending Admission Note Date: 06/20/2012  Patient name: Renee Ho Medical record number: [id number] Date of birth: 1976/01/09 Age: 36 y.o. Gender: female  I saw and evaluated the patient. I reviewed the resident's note and I agree with the resident's findings and plan as documented in the resident's note.  Chief Complaint(s): Diarrhea and fever x11 days.  History - key components related to admission:  [name] is a 36 year old woman with a history of diabetes, diverticulosis complicated by diverticulitis, obesity, and PCOS who presents complaining of 11 days of diarrhea and fever to 102.1. She was in her usual state of health until August 30 when she developed lower, pain, diarrhea, and fever. This pain progressed and she presented to the emergency department where she was given an tordol shot which she had allergic reaction to and was discharged. She followed up in the [facility] and was diagnosed with diverticulitis. She was started on Cipro and Flagyl and completed a six-day course. Despite this, she continued to have fevers, diarrhea, and lower abdominal pain. Over the last 2 days she began to have bright red blood per rectum along with some nausea and vomiting. She represented to the [facility] with the above complaints and was admitted to the inpatient service for further evaluation and therapy.  Since admission and the initiation of Unasyn she's had no more bowel movements or nausea and vomiting. Although she still has some lower quadrant pain is improved.  Physical Exam - key components related to admission:  Filed Vitals:   06/20/12 0135 06/20/12 0623 06/20/12 1424 06/20/12 1737  BP: 128/69 107/61 142/78 141/85  Pulse: 60 55 51 55  Temp: 97.7 F (36.5 C) 97.6 F (36.4 C) 97.9 F (36.6 C)   TempSrc: Oral Oral Axillary Axillary  Resp: 16 16 18 18   SpO2: 97% 98% 93% 93%   Gen: Well-developed, well-nourished, obese woman lying  comfortably in bed on nasal CPAP in no acute distress. Lungs: Clear to auscultation bilaterally without wheezes, rhonchi, or rales. Heart: Regular rate and rhythm without murmurs, rubs, or gallops. Abdomen: Soft, nontender except in the bilateral lower quadrants where there was mild tenderness to deep palpation, right slightly greater than left, active bowel sounds without masses. Extremities: Without edema  Lab results:  Basic Metabolic Panel:  Basename 06/20/12 0540 06/19/12 1549  NA 141 137  K 4.1 3.9  CL 104 100  CO2 29 28  GLUCOSE 129* 154*  BUN 10 13  CREATININE 0.68 0.61  CALCIUM 8.6 9.8  MG -- --  PHOS -- --   Liver Function Tests:  [facility] 06/19/12 1549  AST 22  ALT 21  ALKPHOS 53  BILITOT 0.5  PROT 7.1  ALBUMIN 3.8   CBC:  Basename 06/20/12 0540 06/19/12 1549  WBC 6.5 6.4  NEUTROABS -- 3.7  HGB 13.5 14.5  HCT 39.8 41.8  MCV 88.2 87.6  PLT 138* 163   CBG:  Basename 06/20/12 1741 06/20/12 1211 06/20/12 0812 06/19/12 2125 06/19/12 1435  GLUCAP 105* 110* 125* 104* 171*   Misc. Labs:  Giardia negative Cryptosporidium negative C. difficile negative [place] parasites negative  Assessment & Plan by Problem:  [name] is a 36 year old woman with a history of diabetes, diverticulosis complicated by by diverticulitis, obesity, and PCOS who presents with a clinical picture consistent with diverticulitis. Although she initially did not respond to the Cipro and Flagyl she has responded very well to the Unasyn therapy. Her abdomen is much less tender,  examination relatively benign, and she is without any nausea or vomiting suggesting it's time to start advancing her diet. The bright red blood per rectum is likely secondary to her hemorrhoids. That being said, with the other symptoms, once her diverticulitis calms down clinically on antibiotic therapy, referral for colonoscopy to reassess would not be unreasonable.  1) Diverticulitis: We will continue slowly  advancing her diet, and switching the Unasyn to Augmentin. If she has no further symptoms and is tolerating a regular diet I anticipate she can be discharged home tomorrow to complete her course of antibiotics. When she is followed up in the clinic a referral can be made to gastroenterology to further evaluate her with a colonoscopy.

## 2012-06-21 LAB — BASIC METABOLIC PANEL
Calcium: 8.5 mg/dL (ref 8.4–10.5)
Creatinine, Ser: 0.63 mg/dL (ref 0.50–1.10)
GFR calc Af Amer: >90 mL/min (ref >90)
GFR calc non Af Amer: >90 mL/min (ref >90)

## 2012-06-21 LAB — CBC
MCH: 30.1 pg (ref 26.0–34.0)
MCHC: 33.9 g/dL (ref 30.0–36.0)
MCV: 89 fL (ref 78.0–100.0)
Platelets: 148 10*3/uL — ABNORMAL LOW (ref 150–400)
RDW: 13.5 % (ref 11.5–15.5)
WBC: 5.4 10*3/uL (ref 4.0–10.5)

## 2012-06-21 MED ORDER — AMOXICILLIN-POT CLAVULANATE 875-125 MG PO TABS: 1.0000 | ORAL_TABLET | Freq: Two times a day (BID) | ORAL | Status: AC

## 2012-06-21 MED ORDER — PROMETHAZINE HCL 12.5 MG PO TABS: 12.5000 mg | ORAL_TABLET | Freq: Four times a day (QID) | ORAL | Status: AC | PRN

## 2012-06-21 NOTE — Progress Notes (Signed)
Medical Student Daily Progress Note  Subjective: The patient is a 36 year old woman with a PMH of OSA, HLD, DM, HTN, PCOS, hypothyroidism, and sigmoid diverticulitis who was admitted two days ago for acute diverticulitis. She still complains of nausea for which she is receiving po Phenergan. S She denies any vomiting since admission. She has not had any additional bowel movements. Her abdominal pain has improved. She is tolerating a clear liquid diet. Her main concern is the headache which was similar in nature to the headache she had yesterday morning.   Objective: Vital signs in last 24 hours: Filed Vitals:   06/20/12 1737 06/20/12 2022 06/20/12 2121 06/21/12 0545  BP: 141/85  143/81 135/78  Pulse: 55 65 62 51  Temp:   98.6 F (37 C) 98.2 F (36.8 C)  TempSrc: Axillary  Oral Oral  Resp: 18 17 18 20   SpO2: 93%  100% 97%   Weight change:   Intake/Output Summary (Last 24 hours) at 06/21/12 0832 Last data filed at 06/21/12 0500  Gross per 24 hour  Intake   2595 ml  Output      0 ml  Net   2595 ml   Physical Exam: Gen: laying in bed. No acute distress. Appears to be comfortable.  HEENT: Normocephalic. 2cm round circular protrusion on the posterior aspect of the left neck. Not fluctuant, no redness, erythema, induration. Tender on palpation. Sclera clear. Normal nares. No pharyngeal erythema. CV: RRR. No murmurs, gallops or rubs.  RESP: CTAB. No wheezes, rhonchi or rales.  ABD: Hyperactive bowel sounds. Soft, obese. Slightly tender in the LLQ and RLQ. No rebound, guarding.  Extrem: Pulses 2+. No LE edema.    Lab Results: Basic Metabolic Panel:  Lab 06/21/12 1610 06/20/12 0540  NA 139 141  K 3.6 4.1  CL 103 104  CO2 29 29  GLUCOSE 92 129*  BUN 8 10  CREATININE 0.63 0.68  CALCIUM 8.5 8.6  MG -- --  PHOS -- --   Liver Function Tests:  Lab 06/19/12 1549  AST 22  ALT 21  ALKPHOS 53  BILITOT 0.5  PROT 7.1  ALBUMIN 3.8   CBC:  Lab 06/21/12 0515 06/20/12 0540  06/19/12 1549  WBC 5.4 6.5 --  NEUTROABS -- -- 3.7  HGB 13.1 13.5 --  HCT 38.7 39.8 --  MCV 89.0 88.2 --  PLT 148* 138* --    Micro Results: Recent Results (from the past 240 hour(s))  STOOL CULTURE     Status: Normal (Preliminary result)   Collection Time   06/19/12  6:28 PM      Component Value Range Status Comment   Specimen Description STOOL   Final    Special Requests NONE   Final    Culture Culture reincubated for better growth   Final    Report Status PENDING   Incomplete   OVA AND PARASITE EXAMINATION     Status: Normal   Collection Time   06/19/12  6:28 PM      Component Value Range Status Comment   Specimen Description STOOL   Final    Special Requests NONE   Final    Ova and parasites NO OVA OR PARASITES SEEN FEW YEAST   Final    Report Status 06/20/2012 FINAL   Final   CLOSTRIDIUM DIFFICILE BY PCR     Status: Normal   Collection Time   06/19/12  6:28 PM      Component Value Range Status Comment  C difficile by pcr NEGATIVE  NEGATIVE Final   CULTURE, BLOOD (ROUTINE X 2)     Status: Normal (Preliminary result)   Collection Time   06/19/12  6:40 PM      Component Value Range Status Comment   Specimen Description BLOOD LEFT HAND   Final    Special Requests BOTTLES DRAWN AEROBIC ONLY 10CC   Final    Culture  Setup Time 06/20/2012 01:47   Final    Culture     Final    Value:        BLOOD CULTURE RECEIVED NO GROWTH TO DATE CULTURE WILL BE HELD FOR 5 DAYS BEFORE ISSUING A FINAL NEGATIVE REPORT   Report Status PENDING   Incomplete   CULTURE, BLOOD (ROUTINE X 2)     Status: Normal (Preliminary result)   Collection Time   06/19/12  6:50 PM      Component Value Range Status Comment   Specimen Description BLOOD LEFT HAND   Final    Special Requests BOTTLES DRAWN AEROBIC AND ANAEROBIC 10CC   Final    Culture  Setup Time 06/20/2012 01:47   Final    Culture     Final    Value:        BLOOD CULTURE RECEIVED NO GROWTH TO DATE CULTURE WILL BE HELD FOR 5 DAYS BEFORE ISSUING A FINAL  NEGATIVE REPORT   Report Status PENDING   Incomplete    Studies/Results: No results found. Medications: I have reviewed the patient's current medications. Scheduled Meds:   . amoxicillin-clavulanate  1 tablet Oral Q12H  . DULoxetine  60 mg Oral Daily  . fenofibrate  160 mg Oral Daily  . insulin aspart  0-9 Units Subcutaneous TID WC  . magic mouthwash  10 mL Oral TID  . DISCONTD: ampicillin-sulbactam (UNASYN) IV  3 g Intravenous Q6H  . DISCONTD: ondansetron (ZOFRAN) IV  4 mg Intravenous Q6H   Continuous Infusions:   . DISCONTD: sodium chloride 100 mL/hr at 06/21/12 0534   PRN Meds:.acetaminophen, HYDROcodone-acetaminophen, promethazine, promethazine, DISCONTD:  HYDROmorphone (DILAUDID) injection, DISCONTD: promethazine Assessment/Plan: The patient is a 36 year old woman with a PMH of OSA, HLD, DM, HTN, PCOS, hypothyroidism, and sigmoid diverticulitis who was admitted two days ago for acute diverticulitis.   1. Acute diverticulitis: The patient is currently on day 2 of augmentin. On exam, the patient is afebrile with vitals within normal limits. She does not have a significant pain on exam. She is still complaining of nausea which we will continue to treat with Phenergan. However, she denies any additional vomiting or bloody bowel movements since admission. Her CBC and BMET is unremarkable. In regards to her infectious work-up, C. Difficile PCR was negative. Ova and parasites, stool cultures, blood cultures x 2 are negative thus far. She is tolerating a clear liquid diet and will advance to a regular diet at home. We will also transition her to po pain medications with hydrocodone-acetaminophen. She will be discharged with 1 week of oral augmentin.   2. Hemorrhoids: The patient likely had bleeding from the rectum 2/2 to hemorrhoids. This is consistent with the bloody bowel movements she has previously experienced in association with her hemorrhoids. She denies any bleeding per rectum today.  We will discharge her home with a bowel regimen to help with the hemorrhoids as well as diverticulosis.   3. Type II DM: The patient's most recent HgbA1C on 7/13 was 8.6. Her BGs are slightly elevated. We will place her on a  SSI and hold her metformin and [place].   4. Depression: Will continue cymbalta.   5. HLD: will contin fenofibrate during hospitalization   6. OSA: Patient is using BiPAP at night.   7. Headache: The patient developed a headache yesterday morning which she notes is located in two distinct areas on the posterior aspect of her neck. There is a slight 2cm soft, protrusion on the left side of her head which appears to be consistent with a muscle spasm. The patient is already receiving pain medication and also received Tylenol this morning for her headache. We will encourage her to be out of bed as tolerated to help with this.   8. F/E/N -Fluids: d/c IVF -electolytes:BMET from 9/11 was wnl -nutrition: clears   Disposition: pt will be discharged home today.     LOS: 2 days   This is a Psychologist, occupational Note.  The care of the patient was discussed with Dr. Bosie Clos and the assessment and plan formulated with their assistance.  Please see their attached note for official documentation of the daily encounter.  Joselynne Killam, Hayden Pedro 06/21/2012, 8:32 AM

## 2012-06-21 NOTE — Progress Notes (Signed)
Pt given DC instructions and verbalized understanding.  Pt DC home with family.

## 2012-06-21 NOTE — Progress Notes (Signed)
Internal Medicine Attending  Date: 06/21/2012  Patient name: Renee Ho Medical record number: [id number] Date of birth: [birth year] Age: 36 y.o. Gender: female  I saw and evaluated the patient. I reviewed the resident's note by Dr. Bosie Clos and I agree with the resident's findings and plans as documented in her progress note.  Nausea, vomiting, diarrhea, and abdominal pain are improving.  I agree with the housestaff's plan to discharge her home today on a liquid diet that she can advance at home.  Will complete course of antibiotics and follow-up in the [facility].  She will have to resolve an unpaid bill with [facility] prior to completing outpatient evaluation of her BRBPR.

## 2012-06-21 NOTE — Progress Notes (Signed)
Resident Addendum to Medical Student Note   I have seen and examined the patient, and agree with the the medical student assessment and plan outlined above. Please see my brief note below for additional details.  S: No acute events overnight, improvement in nausea, no vomiting, pain controlled with oral analgesics, tolerating liquid diet   OBJECTIVE: VS: Reviewed  Meds: Reviewed  Labs: Reviewed  Imaging: Reviewed   Physical Exam: General: Well-developed, obese, resting comfortably, nontoxic appearing,  in no acute distress; alert, appropriate and cooperative throughout examination.  HEENT: Normocephalic, atraumatic  Lungs:  Normal respiratory effort. Clear to auscultation BL without crackles or wheezes.  Heart: RRR. S1 and S2 normal without gallop, murmur, or rubs.  Abdomen:  BS normoactive. Soft, obese, suprapubic tenderness to palpation   Extremities: No pretibial edema.     ASSESSMENT/ PLAN: Pt is a 36 y.o. yo female with a PMHx of diverticulosis, Diabetes Mellitus, PCOS and Depression who was admitted on 06/19/2012 with symptoms of nausea, vomiting, lower abdominal pain and hematochezia which was determined to be secondary to diverticulitis.   1. Mild uncomplicated diverticulitis: Diagnosed with sigmoid diverticulosis in 2005 with colonoscopy after presenting with abdominal pain and rectal bleeding. Pathology showed 40 cm polyp with no high grade dysplasia or malignancy and rectal polyp (no adenomatous change or malignancy noted) Her nausea, vomiting and increased stool frequency has improved with bowel rest and IV hydration.  She is tolerating oral liquids and she has been managed with IV Dilaudid, broad spectrum antibiotic of IV Zosyn and Phenergan    Pt states that she has blood in her stools chronically from hemmoroids.  No electrolyte abnormalities or leukocytosis since admission. Pt will be transitioned to Augmentin for 7-10d course, short course of Vicodin prn and oral phenergan  prn.  Counseled on need for liquid diet with advancement as tolerated to high fiber diet.  -if symptoms worsening after discharge may consider CT to assess for possible microperforation  Lab 06/21/12 0515 06/20/12 0540 06/19/12 1549  WBC 5.4 6.5 6.4     2. Diabetes Mellitus: poorly controlled with HgbA1c 8.6.  Pt on Metformin and Januvia with some issues of noncompliance to her regimen, currently being held. -cont current outpt regimen  CBG (last 3)   Basename 06/21/12 0809 06/20/12 2120 06/20/12 1741  GLUCAP 103* 122* 105*         3. Hemorrhoids: needs to be on high fiber diet and consistent bowel regimen on discharge, this has been discussed with the patient.        4. PCOS: on metformin, stable        5. Depression: stable  -Cont Cymbalta          6. OSA: managed with NIPPV at home,   -cont home CPAP         7. Hypertriglyceridemia: on fenofibrate Lipid Panel     Component Value Date/Time   CHOL 181 12/29/2011 1645   TRIG 543* 12/29/2011 1645   HDL 28* 12/29/2011 1645   CHOLHDL 6.5 12/29/2011 1645   VLDL NOT CALC 12/29/2011 1645   LDLCALC Comment:   Not calculated due to Triglyceride >400. Suggest ordering Direct LDL (Unit Code: 16109).   Total Cholesterol/HDL Ratio:CHD Risk                        Coronary Heart Disease Risk Table  Men       Women          1/2 Average Risk              3.4        3.3              Average Risk              5.0        4.4           2X Average Risk              9.6        7.1           3X Average Risk             23.4       11.0 Use the calculated Patient Ratio above and the CHD Risk table  to determine the patient's CHD Risk. ATP III Classification (LDL):       < 100        mg/dL         Optimal      161 - 129     mg/dL         Near or Above Optimal      130 - 159     mg/dL         Borderline High      160 - 189     mg/dL         High       > 096        mg/dL         Very High   0/45/4098 1645           8.  Disposition: improved symptomatically and tolerated liquids with transition to orals.  -  Follow-up scheduled for Sept 16, 2013 at 3:15 in Internal Medicine Clinic.   -  Pt will need to contact [facility] (previous patient of Dr. Luther Parody) to settle outstanding bill thus f/u could not be scheduled before discharge  Length of Stay: 2   Kristie Cowman, MD PGY2, Internal Medicine Resident 06/21/2012, 11:22 AM

## 2012-06-21 NOTE — Discharge Summary (Signed)
Internal Medicine Teaching [facility] Discharge Note  Name: Renee Ho MRN: [id number] DOB: [birth year] 36 y.o.  Date of Admission: 06/19/2012  4:56 PM Date of Discharge: 06/21/2012 Attending Physician: Doneen Poisson, MD  Discharge Diagnosis: Principal Problem:  *Diverticulitis large intestine Active Problems:  DIABETES MELLITUS, TYPE II  HYPERTRIGLYCERIDEMIA  Depression  POLYCYSTIC OVARIAN DISEASE  HYPERTENSION  SLEEP APNEA on CPAP  Diverticulitis  Thrush, oral  Blood per rectum  S/P hysterectomy  H/O colonoscopy with polypectomy  Hemorrhoids  Headache in back of head  Nausea & vomiting   Discharge Medications:   Medication List     As of 06/27/2012  5:48 PM    STOP taking these medications         ciprofloxacin 500 MG tablet   Commonly known as: CIPRO      metroNIDAZOLE 500 MG tablet   Commonly known as: FLAGYL      TAKE these medications         acetaminophen 325 MG tablet   Commonly known as: TYLENOL   Take 650 mg by mouth every 4 (four) hours as needed. For pain.      amoxicillin-clavulanate 875-125 MG per tablet   Commonly known as: AUGMENTIN   Take 1 tablet by mouth every 12 (twelve) hours.      aspirin EC 81 MG tablet   Take 81 mg by mouth daily.      DULoxetine 60 MG capsule   Commonly known as: CYMBALTA   Take 60 mg by mouth daily.      fenofibrate 160 MG tablet   Take 160 mg by mouth daily.      metFORMIN 500 MG tablet   Commonly known as: GLUCOPHAGE   Take 1,000 mg by mouth 2 (two) times daily with a meal.      promethazine 12.5 MG tablet   Commonly known as: PHENERGAN   Take 1 tablet (12.5 mg total) by mouth every 6 (six) hours as needed.      sitaGLIPtin 50 MG tablet   Commonly known as: JANUVIA   Take 50 mg by mouth daily.        Disposition and follow-up:   Renee Ho was discharged from [facility] in stable and improved condition.  At the hospital follow up visit please address advancement  of diet and need for GI follow-up.  Follow-up Appointments:     Follow-up Information    Follow up with Darden Palmer, MD. On 06/26/2012. (at 3:15)    Contact information:   Internal Medicine Clinic [address] Ground Floor [place] [address] [phone]       Schedule an appointment as soon as possible for a visit with Shirley Friar., MD. (former patient of Dr. Luther Parody)    Contact information:   [facility] [address] [place], [address] [place] [address] [phone]         Discharge Orders    Future Orders Please Complete By Expires   Diet general      Comments:   High Fiber   Discharge instructions      Comments:   Take the antibiotics as describe, advance from liquid diet as tolerated, start high fiber diet, follow-up in Internal Medicine Clinic as scheduled, and contact [facility] to schedule follow-up.      Consultations: None    Procedures Performed:  Ct Abdomen Pelvis W Contrast  06/13/2012  *RADIOLOGY REPORT*  Clinical Data: Left  lower quadrant abdominal pain, history of hysterectomy and diverticulitis  CT ABDOMEN AND PELVIS WITH CONTRAST  Technique:  Multidetector CT imaging of the abdomen and pelvis was performed following the standard protocol during bolus administration of intravenous contrast.  Contrast: OMNIPAQUE IOHEXOL 300 MG/ML  SOLN  Comparison: Abdominal CT - 03/01/2012; 07/24/2009; 08/14/2004  Findings:  Normal hepatic contour.  No discrete hepatic lesions.  Normal gallbladder.  No intra or extra panic biliary ductal dilatation. No ascites.  There is symmetric enhancement of the bilateral kidneys.  No definite renal stones on the post contrast examination.  No discrete renal lesions.  No urinary obstruction.  Normal appearance of the bilateral adrenal glands, pancreas and spleen.  Ingested enteric contrast extends to the level of the rectum.  No evidence of enteric  obstruction. Scattered colonic diverticulosis. There is minimal mesenteric stranding and mesenteric venous congestion adjacent to the mid/distal sigmoid colon (axial image 77, series 2, coronal image 70, series 400) which may represent early uncomplicated diverticulitis.  No evidence of perforation or drainable fluid collection.  Normal appearance of the appendix.  No pneumoperitoneum, pneumatosis or portal venous gas.  Normal caliber of the abdominal aorta.  The major branch vessels of the abdominal aorta are patent.  Scattered shoddy retroperitoneal and port hepatis lymph nodes are not enlarged by CT criteria.  No definite pelvic or inguinal lymphadenopathy.  Post hysterectomy.  Residual left adnexal tissue is grossly unchanged measuring approximately 5.3 x 3.4 cm, stable since the 2005 examination.  No discrete right-sided adnexal lesion.  No free fluid in the pelvis.  Limited visualization of the lower thorax demonstrates bibasilar heterogeneous opacities favored to represent atelectasis.  No focal airspace opacities.  No pleural effusion.  Normal heart size.  No pericardial effusion.  No acute or aggressive osseous abnormalities.  Mild degenerative change of the lower thoracic spine.  IMPRESSION:  1.  Suspect early/uncomplicated diverticulitis involving the mid/distal sigmoid colon.  No evidence of perforation or drainable fluid collection.  If not recently performed, correlation with colonoscopy in the nonacute setting is recommended.  2.  Post hysterectomy.  Remaining left adnexal tissue is grossly stable in size since the 2005 examination.   Original Report Authenticated By: Waynard Reeds, M.D.     Admission HPI: [name] is a 36 y.o. woman who presents to clinic today for follow up from her last appointment. She was seen on 9/3 by Dr. Clyde Lundborg and diagnosed with diverticulitis. She was sent home on Cipro and Flagyl. She states that this all started on August 30th when she started to have nausea  and vomiting and mild diarrhea. She has not been able to eat a normal diet since then and has only been able to keep down minimal amounts of fluids. She has also had fever and abdominal pain. Fevers were up to 102.28F with the last on 9/4. The abdominal pain is in the bilateral lower quadrants and she describes it as a burning pain that gets crampy and worse just before she has to have a bowel movement. She notes tenesmus. After the BM the abdominal pain "eases off some." She has been able to take the Cipro and flagyl but notes that she has had white plaques on her tongue and inside of her cheeks since starting the antibiotics. She has vomited 3 times today as well. She denies blood in the vomitus, decreased urination. She also states that she has lost 11 lbs since this all began on 8/30.  Admission Physical  Exam: BP:  137/80   Pulse:  66   Temp:  97.8 F (36.6 C)   TempSrc:  Oral   Height:  5\' 5"  (1.651 m)   Weight:  275 lb 6.4 oz (124.921 kg)   SpO2:  95%    Constitutional: Vital signs reviewed. Patient is a well-developed and well-nourished obese woman in no acute distress and cooperative with exam. Alert and oriented x3.  Head: Normocephalic and atraumatic  Ear: TM normal bilaterally  Mouth: There are several white plaques noted on the tongue and buccal surfaces. no erythema, Dry mucus membranes  Eyes: PERRL, EOMI, conjunctivae normal, No scleral icterus.  Neck: Supple, Trachea midline normal ROM, No JVD, mass, thyromegaly, or carotid bruit present.  Cardiovascular: RRR, S1 normal, S2 normal, no MRG, pulses symmetric and intact bilaterally  Pulmonary/Chest: CTAB, no wheezes, rales, or rhonchi  Abdominal: Soft. Moderate tenderness to deep palpation in the LLQ and RLQ, no rebound tenderness. non-distended, bowel sounds are normal. no masses, organomegaly, or guarding present.  GU: no CVA tenderness Musculoskeletal: No joint deformities, erythema, or stiffness, ROM full and no  nontender Hematology: no cervical, inguinal, or axillary adenopathy.  Neurological: A&O x3, Strength is normal and symmetric bilaterally, cranial nerve II-XII are grossly intact, no focal motor deficit, sensory intact to light touch bilaterally.  Skin: Warm, dry and intact. No rash, cyanosis, or clubbing.  Psychiatric: Normal mood and affect. speech and behavior is normal. Judgment and thought content normal. Cognition and memory are normal.   Hospital Course by problem list:  A) Acute diverticulitis: The patient was started initially on unasyn and then transitioned to oral augmentin. Throughout her hospital course, she remained afebrile and did not have significant pain on exam. She did not have any episodes of emesis but still complained of nausea which we treated with Phenergan. However, she denies any additional vomiting or bloody bowel movements since admission. Her CBC and BMET is unremarkable. In regards to her infectious work-up, C. Difficile PCR was negative. Ova and parasites, stool cultures, blood cultures x 2 are negative thus far. She tolerated a clear liquid diet and will advance to a regular diet at home. We will also transition her to po pain medications with hydrocodone-acetaminophen. She will be discharged with 1 week of oral augmentin.   B) Hemorrhoids: The patient was complaining of well formed bloody bowel movements upon admission. She notes she has had this in the past as a result of her hemorrhoids. While she was in the hospital, she did not have any bloody bowel movements. In 2005, she underwent a colonoscopy for bloody bowel movements secondary to a 40 cm tubular adenoma polyp and hyperplastic rectal polyp. Upon discharge, she was counseled about a high fiber diet. She will also require further evaluation by GI as an outpatient.   C) Type II Diabetes Mellitus: The patient's most recent HgbA1C on 7/13 was 8.6. She is currently on Januvia and metformin at home. We held these  medications and placed her on a sliding scale during her hospitalization.   D) Depression: The patient denied any suicidal or homicidal ideations during her hospital course. Her home medication of cymbalta was continued while she was in the hospital.   E) Hyperlipidemia: The patient's fenofibrate was continued while she was in the hospital.   F) Obstructive Sleep Apnea: The patient brought her home equipment for her BiPAP which she used at night.   G) Headache: The patient developed a headache along the posterior aspect of her neck  while she was in the hospital. It appeared to be most consistent with a muscle spasm as it developed along the area where she wore her BiPAP. There was a slightly elevated 2 cm circular non-fluctuant, non-erythematous elevation of the left side of her occipital region which was tender to palpation. She received Tylenol which appeared to alleviate her symptoms.    Discharge Vitals:  BP 160/75  Pulse 55  Temp 98.8 F (37.1 C) (Oral)  Resp 18  SpO2 95%  Discharge Labs:  Na 139, K 3.6, Cl 103, CO2 29, BUN 8, Cr .63, Ca 8.5, Glucose 92  WBC 12.4, Hgb 13.1, HCT 38.7, MCV 89, Platelets 148  Akinyi Ragwar, MSIV  Signed: Kristie Cowman 06/27/2012, 5:48 PM   Time Spent on Discharge: >30 min

## 2012-06-23 ENCOUNTER — Other Ambulatory Visit (HOSPITAL_COMMUNITY): Payer: Self-pay

## 2012-06-23 LAB — STOOL CULTURE

## 2012-06-26 ENCOUNTER — Ambulatory Visit: Payer: Self-pay | Admitting: Internal Medicine

## 2012-06-26 LAB — CULTURE, BLOOD (ROUTINE X 2): Culture: NO GROWTH

## 2012-06-27 ENCOUNTER — Ambulatory Visit (HOSPITAL_COMMUNITY)
Admission: RE | Admit: 2012-06-27 | Discharge: 2012-06-27 | Disposition: A | Discharge status: Home / Self Care | Payer: 59 | Source: Ambulatory Visit | Attending: Family Medicine | Admitting: Family Medicine

## 2012-06-27 DIAGNOSIS — M25469 Effusion, unspecified knee: Secondary | ICD-10-CM | POA: Insufficient documentation

## 2012-06-27 DIAGNOSIS — M2240 Chondromalacia patellae, unspecified knee: Secondary | ICD-10-CM | POA: Insufficient documentation

## 2012-06-27 DIAGNOSIS — M25561 Pain in right knee: Secondary | ICD-10-CM

## 2012-07-04 ENCOUNTER — Other Ambulatory Visit: Payer: Self-pay | Admitting: Internal Medicine

## 2012-07-11 ENCOUNTER — Encounter (HOSPITAL_COMMUNITY): Payer: Self-pay | Admitting: Anesthesiology

## 2012-07-11 ENCOUNTER — Ambulatory Visit (HOSPITAL_COMMUNITY): Payer: 59 | Admitting: Anesthesiology

## 2012-07-11 ENCOUNTER — Ambulatory Visit (HOSPITAL_COMMUNITY)
Admission: RE | Admit: 2012-07-11 | Discharge: 2012-07-11 | Disposition: A | Discharge status: Home / Self Care | Payer: 59 | Source: Ambulatory Visit | Attending: Orthopedic Surgery | Admitting: Orthopedic Surgery

## 2012-07-11 ENCOUNTER — Encounter (HOSPITAL_COMMUNITY)
Admission: RE | Disposition: A | Discharge status: Home / Self Care | Payer: Self-pay | Source: Ambulatory Visit | Attending: Orthopedic Surgery

## 2012-07-11 ENCOUNTER — Other Ambulatory Visit (HOSPITAL_COMMUNITY): Payer: Self-pay | Admitting: Orthopedic Surgery

## 2012-07-11 ENCOUNTER — Encounter (HOSPITAL_COMMUNITY): Payer: Self-pay | Admitting: *Deleted

## 2012-07-11 DIAGNOSIS — E669 Obesity, unspecified: Secondary | ICD-10-CM | POA: Insufficient documentation

## 2012-07-11 DIAGNOSIS — M2240 Chondromalacia patellae, unspecified knee: Secondary | ICD-10-CM | POA: Insufficient documentation

## 2012-07-11 DIAGNOSIS — M659 Unspecified synovitis and tenosynovitis, unspecified site: Secondary | ICD-10-CM | POA: Insufficient documentation

## 2012-07-11 DIAGNOSIS — Z79899 Other long term (current) drug therapy: Secondary | ICD-10-CM | POA: Insufficient documentation

## 2012-07-11 DIAGNOSIS — I10 Essential (primary) hypertension: Secondary | ICD-10-CM | POA: Insufficient documentation

## 2012-07-11 DIAGNOSIS — E119 Type 2 diabetes mellitus without complications: Secondary | ICD-10-CM | POA: Insufficient documentation

## 2012-07-11 DIAGNOSIS — G4730 Sleep apnea, unspecified: Secondary | ICD-10-CM | POA: Insufficient documentation

## 2012-07-11 DIAGNOSIS — F17200 Nicotine dependence, unspecified, uncomplicated: Secondary | ICD-10-CM | POA: Insufficient documentation

## 2012-07-11 HISTORY — PX: KNEE ARTHROSCOPY: SHX127

## 2012-07-11 LAB — BASIC METABOLIC PANEL
Calcium: 9.3 mg/dL (ref 8.4–10.5)
GFR calc non Af Amer: >90 mL/min (ref >90)
Glucose, Bld: 250 mg/dL — ABNORMAL HIGH (ref 70–99)
Sodium: 136 mEq/L (ref 135–145)

## 2012-07-11 LAB — CBC
MCH: 30.3 pg (ref 26.0–34.0)
MCHC: 34.6 g/dL (ref 30.0–36.0)
Platelets: 156 10*3/uL (ref 150–400)
RBC: 4.69 MIL/uL (ref 3.87–5.11)

## 2012-07-11 LAB — GLUCOSE, CAPILLARY: Glucose-Capillary: 233 mg/dL — ABNORMAL HIGH (ref 70–99)

## 2012-07-11 LAB — SURGICAL PCR SCREEN: MRSA, PCR: NEGATIVE

## 2012-07-11 SURGERY — ARTHROSCOPY, KNEE
Anesthesia: General | Site: Knee | Laterality: Right | Wound class: Clean

## 2012-07-11 MED ORDER — ONDANSETRON HCL 4 MG/2ML IJ SOLN
INTRAMUSCULAR | Status: DC | PRN
  Administered 2012-07-11: 4 mg via INTRAVENOUS

## 2012-07-11 MED ORDER — DEXTROSE 5 % IV SOLN
3.0000 g | INTRAVENOUS | Status: DC | PRN
  Administered 2012-07-11: 3 g via INTRAVENOUS

## 2012-07-11 MED ORDER — MIDAZOLAM HCL 5 MG/5ML IJ SOLN
INTRAMUSCULAR | Status: DC | PRN
  Administered 2012-07-11: 2 mg via INTRAVENOUS

## 2012-07-11 MED ORDER — DEXTROSE 5 % IV SOLN: 3.0000 g | INTRAVENOUS | Status: DC

## 2012-07-11 MED ORDER — OXYCODONE HCL 5 MG PO TABS: 5.0000 mg | ORAL_TABLET | Freq: Once | ORAL | Status: DC | PRN

## 2012-07-11 MED ORDER — HYDROMORPHONE HCL PF 1 MG/ML IJ SOLN
0.2500 mg | INTRAMUSCULAR | Status: DC | PRN
  Administered 2012-07-11 (×3): 0.5 mg via INTRAVENOUS

## 2012-07-11 MED ORDER — LACTATED RINGERS IV SOLN
INTRAVENOUS | Status: DC | PRN
  Administered 2012-07-11 (×2): via INTRAVENOUS

## 2012-07-11 MED ORDER — CEFAZOLIN SODIUM-DEXTROSE 2-3 GM-% IV SOLR
INTRAVENOUS | Status: AC
  Filled 2012-07-11: qty 50

## 2012-07-11 MED ORDER — BUPIVACAINE-EPINEPHRINE PF 0.5-1:200000 % IJ SOLN
INTRAMUSCULAR | Status: DC | PRN
  Administered 2012-07-11: 20 mL

## 2012-07-11 MED ORDER — LIDOCAINE HCL (CARDIAC) 20 MG/ML IV SOLN
INTRAVENOUS | Status: DC | PRN
  Administered 2012-07-11: 100 mg via INTRAVENOUS

## 2012-07-11 MED ORDER — BUPIVACAINE HCL (PF) 0.25 % IJ SOLN
INTRAMUSCULAR | Status: DC | PRN
  Administered 2012-07-11: 30 mL

## 2012-07-11 MED ORDER — HYDROMORPHONE HCL PF 1 MG/ML IJ SOLN
INTRAMUSCULAR | Status: AC
  Administered 2012-07-11: 0.5 mg via INTRAVENOUS
  Filled 2012-07-11: qty 1

## 2012-07-11 MED ORDER — CLONIDINE HCL (ANALGESIA) 100 MCG/ML EP SOLN
150.0000 ug | Freq: Once | EPIDURAL | Status: DC
  Filled 2012-07-11: qty 1.5

## 2012-07-11 MED ORDER — PROPOFOL 10 MG/ML IV BOLUS
INTRAVENOUS | Status: DC | PRN
  Administered 2012-07-11: 300 mg via INTRAVENOUS

## 2012-07-11 MED ORDER — ACETAMINOPHEN 10 MG/ML IV SOLN
INTRAVENOUS | Status: DC | PRN
  Administered 2012-07-11: 1000 mg via INTRAVENOUS

## 2012-07-11 MED ORDER — FENTANYL CITRATE 0.05 MG/ML IJ SOLN
INTRAMUSCULAR | Status: DC | PRN
  Administered 2012-07-11: 50 ug via INTRAVENOUS
  Administered 2012-07-11: 100 ug via INTRAVENOUS
  Administered 2012-07-11 (×2): 50 ug via INTRAVENOUS

## 2012-07-11 MED ORDER — CLONIDINE HCL (ANALGESIA) 100 MCG/ML EP SOLN
EPIDURAL | Status: DC | PRN
  Administered 2012-07-11: 150 ug

## 2012-07-11 MED ORDER — CEFAZOLIN SODIUM 1-5 GM-% IV SOLN
INTRAVENOUS | Status: AC
  Filled 2012-07-11: qty 50

## 2012-07-11 MED ORDER — BUPIVACAINE HCL (PF) 0.25 % IJ SOLN
INTRAMUSCULAR | Status: AC
  Filled 2012-07-11: qty 30

## 2012-07-11 MED ORDER — ONDANSETRON HCL 4 MG/2ML IJ SOLN: 4.0000 mg | Freq: Once | INTRAMUSCULAR | Status: DC | PRN

## 2012-07-11 MED ORDER — OXYCODONE HCL 5 MG/5ML PO SOLN: 5.0000 mg | Freq: Once | ORAL | Status: DC | PRN

## 2012-07-11 MED ORDER — ACETAMINOPHEN 10 MG/ML IV SOLN
INTRAVENOUS | Status: AC
  Filled 2012-07-11: qty 100

## 2012-07-11 MED ORDER — MUPIROCIN 2 % EX OINT
TOPICAL_OINTMENT | CUTANEOUS | Status: AC
  Administered 2012-07-11: 1 via NASAL
  Filled 2012-07-11: qty 22

## 2012-07-11 MED ORDER — SODIUM CHLORIDE 0.9 % IR SOLN
Status: DC | PRN
  Administered 2012-07-11 (×2): 3000 mL

## 2012-07-11 SURGICAL SUPPLY — 53 items
BANDAGE ELASTIC 6 VELCRO ST LF (GAUZE/BANDAGES/DRESSINGS) ×2 IMPLANT
BANDAGE ESMARK 6X9 LF (GAUZE/BANDAGES/DRESSINGS) IMPLANT
BLADE CUDA 5.5 (BLADE) IMPLANT
BLADE GREAT WHITE 4.2 (BLADE) ×2 IMPLANT
BLADE SURG ROTATE 9660 (MISCELLANEOUS) IMPLANT
BNDG ESMARK 6X9 LF (GAUZE/BANDAGES/DRESSINGS)
BUR OVAL 6.0 (BURR) IMPLANT
CLOTH BEACON ORANGE TIMEOUT ST (SAFETY) ×2 IMPLANT
CONT SPECI 4OZ STER CLIK (MISCELLANEOUS) ×2 IMPLANT
COVER SURGICAL LIGHT HANDLE (MISCELLANEOUS) ×2 IMPLANT
CUFF TOURNIQUET SINGLE 34IN LL (TOURNIQUET CUFF) ×2 IMPLANT
CUFF TOURNIQUET SINGLE 44IN (TOURNIQUET CUFF) IMPLANT
DRAPE ARTHROSCOPY W/POUCH 114 (DRAPES) ×2 IMPLANT
DRAPE INCISE IOBAN 66X45 STRL (DRAPES) IMPLANT
DRAPE PROXIMA HALF (DRAPES) IMPLANT
DRAPE U-SHAPE 47X51 STRL (DRAPES) ×2 IMPLANT
DRSG PAD ABDOMINAL 8X10 ST (GAUZE/BANDAGES/DRESSINGS) ×2 IMPLANT
DURAPREP 26ML APPLICATOR (WOUND CARE) ×2 IMPLANT
GAUZE XEROFORM 1X8 LF (GAUZE/BANDAGES/DRESSINGS) ×2 IMPLANT
GLOVE BIOGEL PI IND STRL 6.5 (GLOVE) ×1 IMPLANT
GLOVE BIOGEL PI IND STRL 7.0 (GLOVE) ×1 IMPLANT
GLOVE BIOGEL PI IND STRL 8 (GLOVE) ×1 IMPLANT
GLOVE BIOGEL PI INDICATOR 6.5 (GLOVE) ×1
GLOVE BIOGEL PI INDICATOR 7.0 (GLOVE) ×1
GLOVE BIOGEL PI INDICATOR 8 (GLOVE) ×1
GLOVE SURG ORTHO 8.0 STRL STRW (GLOVE) ×2 IMPLANT
GLOVE SURG SS PI 6.5 STRL IVOR (GLOVE) ×2 IMPLANT
GLOVE SURG SS PI 7.0 STRL IVOR (GLOVE) ×2 IMPLANT
GOWN PREVENTION PLUS XLARGE (GOWN DISPOSABLE) ×2 IMPLANT
GOWN STRL NON-REIN LRG LVL3 (GOWN DISPOSABLE) ×4 IMPLANT
KIT BASIN OR (CUSTOM PROCEDURE TRAY) ×2 IMPLANT
KIT ROOM TURNOVER OR (KITS) ×2 IMPLANT
MANIFOLD NEPTUNE II (INSTRUMENTS) ×2 IMPLANT
NEEDLE 18GX1X1/2 (RX/OR ONLY) (NEEDLE) ×4 IMPLANT
NEEDLE HYPO 25GX1X1/2 BEV (NEEDLE) ×2 IMPLANT
NS IRRIG 1000ML POUR BTL (IV SOLUTION) ×2 IMPLANT
PACK ARTHROSCOPY DSU (CUSTOM PROCEDURE TRAY) ×2 IMPLANT
PAD ARMBOARD 7.5X6 YLW CONV (MISCELLANEOUS) ×4 IMPLANT
PADDING CAST COTTON 6X4 STRL (CAST SUPPLIES) ×2 IMPLANT
SET ARTHROSCOPY TUBING (MISCELLANEOUS) ×1
SET ARTHROSCOPY TUBING LN (MISCELLANEOUS) ×1 IMPLANT
SPONGE GAUZE 4X4 12PLY (GAUZE/BANDAGES/DRESSINGS) IMPLANT
SPONGE LAP 4X18 X RAY DECT (DISPOSABLE) ×2 IMPLANT
SUT ETHILON 3 0 PS 1 (SUTURE) ×2 IMPLANT
SUT MENISCAL KIT (KITS) IMPLANT
SYR 20ML ECCENTRIC (SYRINGE) ×2 IMPLANT
SYR CONTROL 10ML LL (SYRINGE) IMPLANT
SYR TB 1ML LUER SLIP (SYRINGE) ×2 IMPLANT
TOWEL OR 17X24 6PK STRL BLUE (TOWEL DISPOSABLE) ×2 IMPLANT
TOWEL OR 17X26 10 PK STRL BLUE (TOWEL DISPOSABLE) ×2 IMPLANT
TUBE CONNECTING 12X1/4 (SUCTIONS) ×2 IMPLANT
WAND 90 DEG TURBOVAC W/CORD (SURGICAL WAND) ×2 IMPLANT
WATER STERILE IRR 1000ML POUR (IV SOLUTION) ×2 IMPLANT

## 2012-07-11 NOTE — Anesthesia Preprocedure Evaluation (Addendum)
Anesthesia Evaluation  Patient identified by MRN, date of birth, ID band Patient awake    Reviewed: Allergy & Precautions, H&P , NPO status , Patient's Chart, lab work & pertinent test results  History of Anesthesia Complications Negative for: history of anesthetic complications  Airway Mallampati: II TM Distance: >3 FB Neck ROM: Full    Dental  (+) Teeth Intact and Dental Advisory Given   Pulmonary sleep apnea and Continuous Positive Airway Pressure Ventilation , former smoker (1 ppd x  24 yrs; quit 2 months ago),  breath sounds clear to auscultation        Cardiovascular hypertension, Pt. on medications Rhythm:Regular Rate:Normal     Neuro/Psych Depression    GI/Hepatic   Endo/Other  diabetes, Type 2, Oral Hypoglycemic AgentsMorbid obesity  Renal/GU      Musculoskeletal   Abdominal   Peds  Hematology   Anesthesia Other Findings   Reproductive/Obstetrics                          Anesthesia Physical Anesthesia Plan  ASA: III  Anesthesia Plan: General   Post-op Pain Management:    Induction: Intravenous  Airway Management Planned: LMA  Additional Equipment:   Intra-op Plan:   Post-operative Plan: Extubation in OR  Informed Consent:   Dental advisory given  Plan Discussed with: CRNA, Anesthesiologist and Surgeon  Anesthesia Plan Comments:         Anesthesia Quick Evaluation

## 2012-07-11 NOTE — Anesthesia Procedure Notes (Addendum)
Procedure Name: LMA Insertion Date/Time: 07/11/2012 4:02 PM Performed by: Romie Minus K Pre-anesthesia Checklist: Patient identified, Emergency Drugs available, Suction available, Patient being monitored and Timeout performed Patient Re-evaluated:Patient Re-evaluated prior to inductionOxygen Delivery Method: Circle system utilized Preoxygenation: Pre-oxygenation with 100% oxygen Intubation Type: IV induction Ventilation: Mask ventilation without difficulty and Oral airway inserted - appropriate to patient size LMA: LMA inserted LMA Size: 5.0 Number of attempts: 1 Placement Confirmation: positive ETCO2 and breath sounds checked- equal and bilateral Tube secured with: Tape Dental Injury: Teeth and Oropharynx as per pre-operative assessment    Performed by: Doylene Canard, Yeimy Brabant K

## 2012-07-11 NOTE — Preoperative (Signed)
Beta Blockers   Reason not to administer Beta Blockers:Not Applicable 

## 2012-07-11 NOTE — Progress Notes (Signed)
Orthopedic Tech Progress Note Patient Details:  Renee Ho [birth year] [id number]  Ortho Devices Type of Ortho Device: Crutches Ortho Device/Splint Interventions: Application;Ordered   [name] 07/11/2012, 7:45 PM

## 2012-07-11 NOTE — Anesthesia Postprocedure Evaluation (Signed)
  Anesthesia Post-op Note  Patient: Renee Ho  Procedure(s) Performed: Procedure(s) (LRB) with comments: ARTHROSCOPY KNEE (Right) - Right knee arthroscopy with debridement  Patient Location: PACU  Anesthesia Type: General  Level of Consciousness: awake, alert  and oriented  Airway and Oxygen Therapy: Patient Spontanous Breathing  Post-op Pain: mild  Post-op Assessment: Post-op Vital signs reviewed, Patient's Cardiovascular Status Stable, Respiratory Function Stable, Patent Airway and No signs of Nausea or vomiting  Post-op Vital Signs: Reviewed and stable  Complications: No apparent anesthesia complications

## 2012-07-11 NOTE — Interval H&P Note (Signed)
History and Physical Interval Note:  07/11/2012 3:25 PM  Renee Ho  has presented today for surgery, with the diagnosis of right knee meniscal tear  The various methods of treatment have been discussed with the patient and family. After consideration of risks, benefits and other options for treatment, the patient has consented to  Procedure(s) (LRB) with comments: ARTHROSCOPY KNEE (Right) - Right knee arthroscopy with debridement as a surgical intervention .  The patient's history has been reviewed, patient examined, no change in status, stable for surgery.  I have reviewed the patient's chart and labs.  Questions were answered to the patient's satisfaction.     Chalsey Leeth SCOTT

## 2012-07-11 NOTE — H&P (Signed)
  [id number] 

## 2012-07-11 NOTE — Brief Op Note (Signed)
07/11/2012  5:12 PM  PATIENT:  Renee Ho  36 y.o. female  PRE-OPERATIVE DIAGNOSIS:  right knee meniscal tear  POST-OPERATIVE DIAGNOSIS:  Right knee synovitis cmpatella  PROCEDURE:  Procedure(s): ARTHROSCOPY KNEE  SURGEON:  Surgeon(s): Cammy Copa, MD  ASSISTANT:   ANESTHESIA:   general  EBL: 19 ml    Total I/O In: 1150 [I.V.:1150] Out: 50 [Blood:50]  BLOOD ADMINISTERED: none  DRAINS: none   LOCAL MEDICATIONS USED:  none  SPECIMEN:  No Specimen  COUNTS:  YES  TOURNIQUET:  * Missing tourniquet times found for documented tourniquets in log:  [id number] *  DICTATION: .Other Dictation: Dictation Number [id number]  PLAN OF CARE: Discharge to home after PACU  PATIENT DISPOSITION:  PACU - hemodynamically stable

## 2012-07-11 NOTE — Transfer of Care (Signed)
Immediate Anesthesia Transfer of Care Note  Patient: Renee Ho  Procedure(s) Performed: Procedure(s) (LRB) with comments: ARTHROSCOPY KNEE (Right) - Right knee arthroscopy with debridement  Patient Location: PACU  Anesthesia Type: General  Level of Consciousness: awake, alert , oriented and patient cooperative  Airway & Oxygen Therapy: Patient Spontanous Breathing and Patient connected to face mask oxygen  Post-op Assessment: Report given to PACU RN, Post -op Vital signs reviewed and stable and Patient moving all extremities  Post vital signs: Reviewed and stable  Complications: No apparent anesthesia complications

## 2012-07-12 ENCOUNTER — Other Ambulatory Visit: Payer: Self-pay | Admitting: Internal Medicine

## 2012-07-12 MED ORDER — SITAGLIPTIN PHOSPHATE 50 MG PO TABS: 50.0000 mg | ORAL_TABLET | Freq: Every day | ORAL | Status: DC

## 2012-07-12 NOTE — H&P (Signed)
Renee, Ho               ACCOUNT NO.:  [id number]  MEDICAL RECORD NO.:  [id number]  LOCATION:  [facility]                         FACILITY:  [facility]  PHYSICIAN:  Burnard Bunting, M.D.    DATE OF BIRTH:  03/15/1976  DATE OF ADMISSION:  07/11/2012 DATE OF DISCHARGE:  07/11/2012                             HISTORY & PHYSICAL   CHIEF COMPLAINT:  Right knee pain.  HISTORY OF PRESENT ILLNESS:  Renee Ho is a 36 year old patient with right knee pain.  She was seen on July 05, 2012, in the clinic and __________.  She initially was seen by Dr. Loleta Chance.  She has had longstanding problems with the knee including recurrent significant effusions.  She has had multiple aspirations which have been negative. The patient feels like the knee is giving way but it is not locked but feeling stable but she does report consistent pain and swelling in the knee.  No other joints bother her.  She has played softball and denies any history of injury to the knee.  MEDICATIONS:  Metformin, Cymbalta, Januvia, and ibuprofen.  PAST MEDICAL HISTORY:  Notable for hysterectomy.  SOCIAL HISTORY:  The patient is single, smokes half pack a day.  Does not drink.  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  FAMILY HISTORY:  Positive for diabetes.  No family history of DVT or pulmonary embolism.  REVIEW OF SYSTEMS:  All other systems reviewed and are negative they relate to the knee.  PHYSICAL EXAMINATION:  VITAL SIGNS:  The patient is 5 feet 5 inches, weight 280 pounds. CHEST: Clear to auscultation. HEART:  Regular rate and rhythm. ABDOMEN:  Benign. EXTREMITIES:  Right knee demonstrates mild effusion, __________ medial and lateral joint line tenderness.  No other masses __________ right knee region.  Range of motion is full.  No nerve root tension signs.  No groin pain with __________ of the leg.  MRI scan shows flap tear of the lateral facet of patella.  Menisci are intact.  IMPRESSION:   Persistent right knee pain, recurrent effusions.  No definite history of infection.  Aspiration has been negative for infection at this time.  Negative for gout as well.  Plan is to check arthritis panel and anti-CCP antibody panel.  At the time of this dictation, the studies are negative.  The rheumatoid arthritis is much less likely.  She does have recurrent effusions in the flap tear.  I have read over the MRI scan with the patient.  It showed that the flap tear I think is unlikely that all of the swelling is coming from the small flap tear.  Nonetheless, I think arthroscopy is indicated for the failure of conservative management with activity modification, bracing, anti-inflammatories, aspirations, and injections.  The patient understands the risks and benefits of surgical intervention including the possibility of incomplete pain relief, infection, knee stiffness. __________ work time probably on the order of 4-6 weeks based on the physical nature of the work.  The patient understands the risks and benefits and wished to proceed.  All questions answered.     Burnard Bunting, M.D.     GSD/MEDQ  D:  07/11/2012  T:  07/12/2012  Job:  [id number]

## 2012-07-12 NOTE — Op Note (Signed)
Renee Ho, Renee Ho               ACCOUNT NO.:  [id number]  MEDICAL RECORD NO.:  [id number]  LOCATION:  [facility]                         FACILITY:  [facility]  PHYSICIAN:  Burnard Bunting, M.D.    DATE OF BIRTH:  Sep 23, 1976  DATE OF PROCEDURE: DATE OF DISCHARGE:  07/11/2012                              OPERATIVE REPORT   PREOPERATIVE DIAGNOSIS:  Right knee synovitis, chondromalacia patella.  POSTOPERATIVE DIAGNOSIS:  Right knee synovitis, chondromalacia patella.  PROCEDURE:  Right knee diagnostic arthroscopy, chondromalacia patella debridement with partial knee synovectomy.  SURGEON:  Burnard Bunting, M.D.  ASSISTANT:  None.  ANESTHESIA:  General endotracheal.  ESTIMATED BLOOD LOSS:  Minimal.  INDICATIONS:  The patient with right knee pain, recurrent effusions, significant pain, and locking presents for operative management after explanation of risks and benefits.  OPERATIVE FINDINGS: 1. Examination under anesthesia, range of motion 0-140, stability     varus-valgus stress.  ACL and PCL intact.  No posterolateral     rotatory instability. 2. Diagnostic arthroscopy. 3. Grade 2-3 chondromalacia undersurface of the patella with loose     chondral flaps with significant synovitis present in the     suprapatellar pouch, lateral gutters, and anterior compartment.     Medial and lateral compartments were intact with intact meniscus     and articular cartilage.  ACL and PCL intact.  PROCEDURE IN DETAIL:  The patient was brought to the operating room, where general endotracheal anesthesia was induced.  Preop antibiotics were administered.  Time-out was called.  Right leg was prescrubbed with alcohol and Betadine, which was allowed to air dry.  Prepped with DuraPrep solution including the toes and draped in a sterile fashion. Anterior-inferior medial port was established.  Anterior-inferior lateral portal was established under direct visualization.  Diagnostic arthroscopy was performed.   Immediately apparent was significant synovitis both in the intercondylar notch anterior compartment, medial and lateral gutters, and in the suprapatellar pouch.  Samples of this were obtained and sent to Pathology for analysis.  Did not looked like PVNS, but did look like exuberant, redundant synovial inflammation. There was no evidence of infection.  Loose chondral flaps were debrided on the under surface of the patella.  Following a thorough debridement and anterior compartment synovectomy with a shaver and ArthroCare wand, knee joint was thoroughly irrigated.  Instruments were removed.  Portal was closed using 3-0 nylon.  Solution of Marcaine and clonidine was injected to the knee.  Bulky wrap was applied.  The patient tolerated the procedure well without immediate complications.  Transferred her to recovery room in a stable condition.     Burnard Bunting, M.D.     GSD/MEDQ  D:  07/11/2012  T:  07/12/2012  Job:  [id number]

## 2012-07-13 ENCOUNTER — Encounter (HOSPITAL_COMMUNITY): Payer: Self-pay | Admitting: Orthopedic Surgery

## 2012-07-14 ENCOUNTER — Telehealth: Payer: Self-pay | Admitting: Dietician

## 2012-07-14 NOTE — Telephone Encounter (Signed)
Will try back again next week

## 2012-07-17 ENCOUNTER — Other Ambulatory Visit: Payer: Self-pay | Admitting: Internal Medicine

## 2012-07-17 MED ORDER — SITAGLIPTIN PHOSPHATE 100 MG PO TABS: 100.0000 mg | ORAL_TABLET | Freq: Every day | ORAL | Status: DC

## 2012-07-27 ENCOUNTER — Telehealth: Payer: Self-pay | Admitting: Dietician

## 2012-07-27 NOTE — Telephone Encounter (Signed)
Pt left voicemail that she is out of work after knee surgery 07-11-12, has not been to see Dr. Dione Booze or Bosie Clos.

## 2012-08-01 ENCOUNTER — Telehealth: Payer: Self-pay | Admitting: Dietician

## 2012-08-01 NOTE — Telephone Encounter (Signed)
Per front office: Patient was called to schedule an appointment. She had surgery on 07-11-12 for meniscal tear. Said she would call back when she is feeling better and moving around more

## 2012-08-01 NOTE — Telephone Encounter (Signed)
Will ask front office to schedule to see PCP

## 2012-09-05 ENCOUNTER — Encounter: Payer: Self-pay | Admitting: Internal Medicine

## 2012-09-18 ENCOUNTER — Encounter: Payer: Self-pay | Admitting: Internal Medicine

## 2012-09-18 ENCOUNTER — Ambulatory Visit (INDEPENDENT_AMBULATORY_CARE_PROVIDER_SITE_OTHER): Payer: 59 | Admitting: Internal Medicine

## 2012-09-18 VITALS — BP 156/94 | HR 85 | Temp 98.6°F | Ht 65.0 in | Wt 281.9 lb

## 2012-09-18 DIAGNOSIS — E282 Polycystic ovarian syndrome: Secondary | ICD-10-CM

## 2012-09-18 DIAGNOSIS — I152 Hypertension secondary to endocrine disorders: Secondary | ICD-10-CM | POA: Insufficient documentation

## 2012-09-18 DIAGNOSIS — E119 Type 2 diabetes mellitus without complications: Secondary | ICD-10-CM

## 2012-09-18 DIAGNOSIS — Z0000 Encounter for general adult medical examination without abnormal findings: Secondary | ICD-10-CM

## 2012-09-18 DIAGNOSIS — Z79899 Other long term (current) drug therapy: Secondary | ICD-10-CM

## 2012-09-18 DIAGNOSIS — I10 Essential (primary) hypertension: Secondary | ICD-10-CM

## 2012-09-18 LAB — POCT GLYCOSYLATED HEMOGLOBIN (HGB A1C): Hemoglobin A1C: 9.5

## 2012-09-18 MED ORDER — HYDROCHLOROTHIAZIDE 12.5 MG PO TABS: 12.5000 mg | ORAL_TABLET | Freq: Every day | ORAL | Status: DC

## 2012-09-18 NOTE — Progress Notes (Signed)
  Subjective:    Patient ID: Renee Ho, female    DOB: 07-Mar-1976, 36 y.o.   MRN: [id number]  HPI  Presents for f/u diabetes mellitus and htn. Reports that she has not been fully compliant with anti-glycemic regimen. States that 60 yo daughter is newly pregnant but appears happy about circumstances.  Review of Systems  Constitutional: Negative for fever, appetite change and fatigue.  HENT: Negative for congestion.   Respiratory: Negative for cough and shortness of breath.   Cardiovascular: Negative for chest pain and leg swelling.  Gastrointestinal: Negative for diarrhea, constipation and blood in stool.  Neurological: Negative for light-headedness and headaches.  Psychiatric/Behavioral: Negative for decreased concentration.       Objective:   Physical Exam  Constitutional: She is oriented to person, place, and time. She appears well-developed and well-nourished. No distress.       obese  HENT:  Head: Normocephalic and atraumatic.  Eyes: Conjunctivae normal and EOM are normal. Pupils are equal, round, and reactive to light.  Neck: Normal range of motion.  Cardiovascular: Normal rate, regular rhythm, normal heart sounds and intact distal pulses.   Pulmonary/Chest: Effort normal and breath sounds normal.  Abdominal: Soft. Bowel sounds are normal.       obese  Neurological: She is alert and oriented to person, place, and time.  Skin: Skin is warm and dry.  Psychiatric: She has a normal mood and affect.          Assessment & Plan:  1. Diabetes mellitus, poor control: HgbA1c 9.5 today, likely secondary to medication noncompliance and poor diet control, counseled about likely need for insulin therapy, discussed health consequences including that of stroke, heart attack, diabetic neuropathy, etc. -check urine microalbumin today -cont Januvia 100 mg qd -cont Metformin 1000 mg bid -f/u in 3 months, pt resistant to insulin therapy at this point -eye appt 10/18/2011 with Dr.  Dione Booze  2. Hypertension: above goal, 157/86 pulse 75 bpm  3. Preventative care: received flu shot as [employer], f/u Dr. Cherly Hensen for GYN care pt s/p hysterectomy and PCOS

## 2012-09-18 NOTE — Assessment & Plan Note (Signed)
Lab Results  Component Value Date   HGBA1C 9.5 09/18/2012   HGBA1C 8.6 04/26/2012   HGBA1C 7.7 12/29/2011     Assessment:  Diabetes control: poor control (HgbA1C >9%)  Progress toward A1C goal:  deteriorated  Comments:   Plan:  Medications:  continue current medications  Home glucose monitoring:   Frequency:     Timing:    Instruction/counseling given: discussed the need for weight loss and discussed diet  Educational resources provided:    Self management tools provided:    Other plans:

## 2012-09-18 NOTE — Patient Instructions (Addendum)
General Instructions: It is important to take the diabetic medication as prescribed. You blood sugars are very poorly controlled which will lead to serious complications that we discussed. You will need to focus on diet and weight loss to help your blood sugars. At your next follow-up we will re-visit the need for insulin therapy and consult with the Diabetic Educator. We need to recheck your blood pressure after 1 week of being on the blood pressure medicine.   Treatment Goals:  Goals (1 Years of Data) as of 09/18/2012          As of Today As of Today As of Today 07/11/12 07/11/12     Blood Pressure    . Blood Pressure < 140/90  156/94 131/69 157/86 141/89 141/85     Result Component    . HEMOGLOBIN A1C < 7.0  9.5        . LDL CALC < 100            Progress Toward Treatment Goals:  Treatment Goal 09/18/2012  Hemoglobin A1C deteriorated  Blood pressure deteriorated    Self Care Goals & Plans:  Self Care Goal 09/18/2012  Manage my medications take my medicines as prescribed       Care Management & Community Referrals:   Follow-up with Diabetic Educator at next visit. Return for blood pressure recheck in 1 week.  We may need to increase the HCTZ.

## 2012-09-18 NOTE — Assessment & Plan Note (Addendum)
BP Readings from Last 3 Encounters:  09/18/12 156/94  07/11/12 141/89  07/11/12 141/89    Lab Results  Component Value Date   NA 136 07/11/2012   K 3.7 07/11/2012   CREATININE 0.63 07/11/2012    Assessment:  Blood pressure control: mildly elevated  Progress toward BP goal:  deteriorated  Comments:   Plan:  Medications:  Start hctz 12.5 mg qd. With f/u in 1 week.  Educational resources provided:    Self management tools provided:    Other plans:

## 2012-09-18 NOTE — Assessment & Plan Note (Signed)
>>  ASSESSMENT AND PLAN FOR ESSENTIAL HYPERTENSION WRITTEN ON 09/18/2012  4:35 PM BY SCHOOLER, KAREN-AKOSUA M  BP Readings from Last 3 Encounters:  09/18/12 156/94  07/11/12 141/89  07/11/12 141/89    Lab Results  Component Value Date   NA 136 07/11/2012   K 3.7 07/11/2012   CREATININE 0.63 07/11/2012    Assessment:  Blood pressure control: mildly elevated  Progress toward BP goal:  deteriorated  Comments:   Plan:  Medications:  Start hctz 12.5 mg qd. With f/u in 1 week.  Educational resources provided:    Self management tools provided:    Other plans:

## 2012-09-19 LAB — MICROALBUMIN / CREATININE URINE RATIO
Creatinine, Urine: 101.6 mg/dL
Microalb Creat Ratio: 37.5 mg/g — ABNORMAL HIGH (ref 0.0–30.0)

## 2012-09-25 ENCOUNTER — Ambulatory Visit (INDEPENDENT_AMBULATORY_CARE_PROVIDER_SITE_OTHER): Payer: 59 | Admitting: Internal Medicine

## 2012-09-25 VITALS — BP 145/86 | Temp 99.2°F | Ht 65.0 in | Wt 278.7 lb

## 2012-09-25 DIAGNOSIS — IMO0002 Reserved for concepts with insufficient information to code with codable children: Secondary | ICD-10-CM

## 2012-09-25 DIAGNOSIS — I10 Essential (primary) hypertension: Secondary | ICD-10-CM

## 2012-09-25 DIAGNOSIS — E1165 Type 2 diabetes mellitus with hyperglycemia: Secondary | ICD-10-CM

## 2012-09-25 MED ORDER — HYDROCHLOROTHIAZIDE 25 MG PO TABS: 25.0000 mg | ORAL_TABLET | Freq: Every day | ORAL | Status: DC

## 2012-09-25 NOTE — Progress Notes (Signed)
  Subjective:    Patient ID: Renee Ho, female    DOB: Dec 29, 1975, 36 y.o.   MRN: [id number]  HPI  Hx significant for poorly controlled Diabetes, Polycystic Ovarian Syndrome, Morbid Obesity, hypertension, diverticulosis and depression. Presents for f/u bp after starting HCTZ 12.5 mg qd 1 week ago.  Today bp still slightly elevated at sbp 140s.  Reports urinating a lot but otherwise without complaints.   Review of Systems As per HPI    Objective:   Physical Exam  Constitutional: She is oriented to person, place, and time. She appears well-developed and well-nourished.       Morbidly obese  HENT:  Head: Normocephalic and atraumatic.  Cardiovascular: Normal rate, regular rhythm, normal heart sounds and intact distal pulses.   Pulmonary/Chest: Effort normal and breath sounds normal. She has no wheezes. She has no rales.  Abdominal: Soft. Bowel sounds are normal. There is no tenderness.  Musculoskeletal: Normal range of motion. She exhibits no edema.  Neurological: She is alert and oriented to person, place, and time.  Skin: Skin is warm and dry.  Psychiatric: She has a normal mood and affect.          Assessment & Plan:  1. Htn: increase to HCTZ 25 mg qd with repeat bp check in 2 weeks

## 2012-09-25 NOTE — Assessment & Plan Note (Signed)
Increase HCTZ to 25 mg qd  BP Readings from Last 3 Encounters:  09/18/12 156/94  07/11/12 141/89  07/11/12 141/89    Lab Results  Component Value Date   NA 136 07/11/2012   K 3.7 07/11/2012   CREATININE 0.63 07/11/2012    Assessment:  Blood pressure control:    Progress toward BP goal:     Comments:   Plan:  Medications:  continue current medications  Educational resources provided:    Self management tools provided:    Other plans: increase HCTZ to 25 mg qd

## 2012-10-17 NOTE — Addendum Note (Signed)
Addended by: Bufford Spikes on: 10/17/2012 10:48 AM   Modules accepted: Orders

## 2012-11-01 ENCOUNTER — Encounter: Payer: 59 | Admitting: Obstetrics and Gynecology

## 2013-01-30 ENCOUNTER — Ambulatory Visit (INDEPENDENT_AMBULATORY_CARE_PROVIDER_SITE_OTHER): Payer: Self-pay | Admitting: Family Medicine

## 2013-01-30 VITALS — BP 140/86 | Wt 270.0 lb

## 2013-01-30 DIAGNOSIS — E119 Type 2 diabetes mellitus without complications: Secondary | ICD-10-CM

## 2013-01-30 NOTE — Progress Notes (Signed)
Patient presents today for diabetes follow-up as part of the employer-sponsored [employer] to [employer] program. Current diabetes regimen includes Januvia and Metformin. Patient also continues on daily ASA and statin. Patient is not on an ACEi at this time. Most recent MD follow-up was in Dec 2013, no follow-up scheduled at this time. No major health changes at this time.   Diabetes Assessment: Does not take medications as prescribed; Type of Diabetes: Type 2; checks feet daily; MD managing Diabetes Kristie Cowman, PCP; checks blood glucose Never; does not use glucometer; takes an aspirin a day; A1c 9.1 (prev 9.5 via MD office, 09/18/13) Other Diabetes History: Current med regimen includes Januvia 100 mg daily and Metformin 1000 mg twice daily. Patient does not maintain good medication compliance. 90 day supply of Metformin lasted approximately 1 year, 90 day supply of Januvia lasted approximately 6 months. I have encouraged patient to improve compliance for better control of glucose. Patient did bring meter today and is not testing at this time. At this point we will focus on med compliance and revisit glucose testing at a later date. Patient denies symptoms of hypoglycemia. Patient denies signs and symptoms of neuropathy in feet, but reports neuropathy and aching pain in upper arms. 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  Patient is up-to-date on eye exam with no signs of retinopathy.   Lifestyle Factors: Diet - Patient admits that diet has deteriorated somewhat and needs improvment. Patient eats pasta at least 4 times per week, including macaroni and cheese, hamburger helper, spaghetti, pasta sides, lasagna, etc. She has agreed to attempt to decrease portion sizes of pasta and decrease number of times pasta is prepared per week. On a positive note, patient is attempting to eat more vegetables along with her pasta.  Exercise - No routine exercise; however, she does walk  her two large dogs daily for at least 15 minutes. Time is the limiting factor. I have encouraged her to walk the dogs for 20-25 mintues daily.  Assessment: Pt presents today for DM follow-up. She remains unchanged with regard to med compliance and exercise. She has allowed diet to deteriorate somewhat. As a result, A1c is elevated above goal. We have continued to set goals, with primary focus being medication compliance. Patient will return in 8 weeks.  Plan: 1) Improved medications compliance!!! 2) Limit pasta (serving size and number of times per week) 3) Continue exercising daily, increase if time allows.  4) A1c today = 9.1 5) Make a follow-up appt with Dr. Bosie Clos 6) Return for follow-up in 8 weeks on Tues July 1st @ 3:00 pm

## 2013-02-12 NOTE — Progress Notes (Signed)
Patient ID: Renee Ho, female   DOB: 06/12/1976, 37 y.o.   MRN: [id number] ATTENDING PHYSICIAN NOTE: I have reviewed the chart and agree with the plan as detailed above. Antonetta Clanton MD Pager [phone]  

## 2013-03-08 ENCOUNTER — Encounter (HOSPITAL_COMMUNITY): Payer: Self-pay | Admitting: *Deleted

## 2013-03-08 ENCOUNTER — Emergency Department (HOSPITAL_COMMUNITY): Payer: PRIVATE HEALTH INSURANCE

## 2013-03-08 ENCOUNTER — Emergency Department (HOSPITAL_COMMUNITY)
Admission: EM | Admit: 2013-03-08 | Discharge: 2013-03-08 | Disposition: A | Discharge status: Home / Self Care | Payer: PRIVATE HEALTH INSURANCE | Attending: Emergency Medicine | Admitting: Emergency Medicine

## 2013-03-08 DIAGNOSIS — Z7982 Long term (current) use of aspirin: Secondary | ICD-10-CM | POA: Insufficient documentation

## 2013-03-08 DIAGNOSIS — E282 Polycystic ovarian syndrome: Secondary | ICD-10-CM | POA: Insufficient documentation

## 2013-03-08 DIAGNOSIS — X500XXA Overexertion from strenuous movement or load, initial encounter: Secondary | ICD-10-CM | POA: Insufficient documentation

## 2013-03-08 DIAGNOSIS — Y990 Civilian activity done for income or pay: Secondary | ICD-10-CM | POA: Insufficient documentation

## 2013-03-08 DIAGNOSIS — M5380 Other specified dorsopathies, site unspecified: Secondary | ICD-10-CM | POA: Insufficient documentation

## 2013-03-08 DIAGNOSIS — Z8719 Personal history of other diseases of the digestive system: Secondary | ICD-10-CM | POA: Insufficient documentation

## 2013-03-08 DIAGNOSIS — E119 Type 2 diabetes mellitus without complications: Secondary | ICD-10-CM | POA: Insufficient documentation

## 2013-03-08 DIAGNOSIS — Z79899 Other long term (current) drug therapy: Secondary | ICD-10-CM | POA: Insufficient documentation

## 2013-03-08 DIAGNOSIS — E7800 Pure hypercholesterolemia, unspecified: Secondary | ICD-10-CM | POA: Insufficient documentation

## 2013-03-08 DIAGNOSIS — Y9210 Unspecified residential institution as the place of occurrence of the external cause: Secondary | ICD-10-CM | POA: Insufficient documentation

## 2013-03-08 DIAGNOSIS — Y9389 Activity, other specified: Secondary | ICD-10-CM | POA: Insufficient documentation

## 2013-03-08 DIAGNOSIS — Z87891 Personal history of nicotine dependence: Secondary | ICD-10-CM | POA: Insufficient documentation

## 2013-03-08 DIAGNOSIS — M62830 Muscle spasm of back: Secondary | ICD-10-CM

## 2013-03-08 HISTORY — DX: Sleep apnea, unspecified: G47.30

## 2013-03-08 HISTORY — DX: Essential (primary) hypertension: I10

## 2013-03-08 MED ORDER — DIAZEPAM 5 MG PO TABS
5.0000 mg | ORAL_TABLET | Freq: Once | ORAL | Status: AC
  Administered 2013-03-08: 5 mg via ORAL
  Filled 2013-03-08: qty 1

## 2013-03-08 MED ORDER — DIAZEPAM 5 MG PO TABS: 5.0000 mg | ORAL_TABLET | Freq: Two times a day (BID) | ORAL | Status: DC

## 2013-03-08 MED ORDER — KETOROLAC TROMETHAMINE 60 MG/2ML IM SOLN
60.0000 mg | Freq: Once | INTRAMUSCULAR | Status: DC
  Filled 2013-03-08: qty 2

## 2013-03-08 NOTE — ED Notes (Signed)
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 Pain on lower back with radiation to right shoulder and feet bilaterally numb and tingling

## 2013-03-08 NOTE — ED Provider Notes (Signed)
History     CSN: [id number]  Arrival date & time 03/08/13  0932   First MD Initiated Contact with Patient 03/08/13 [phone]      No chief complaint on file.   (Consider location/radiation/quality/duration/timing/severity/associated sxs/prior treatment) HPI Comments: 37 y.o. female w/ pmh of obesity, DM, PCOS presents to the Er after she states she was working, and was helping transfer a patient, and then states the patient fell back into her, and she was carrying the patient's full weight, and she "felt her back go out". No trauma to her back, she did not fall on her back. Pt states that she is having "spasms", starting from her lower back and moving to her right side.   Patient is a 37 y.o. female presenting with general illness. The history is provided by the patient.  Illness Severity:  Mild Onset quality:  Sudden Timing:  Intermittent Progression:  Waxing and waning Chronicity:  New Associated symptoms: no abdominal pain, no chest pain, no congestion, no cough, no diarrhea, no fatigue, no fever, no headaches, no rash, no vomiting and no wheezing     Past Medical History  Diagnosis Date  . Diabetes mellitus   . Diverticula of intestine   . High cholesterol   . Polycystic ovarian syndrome     Past Surgical History  Procedure Laterality Date  . Abdominal hysterectomy    . Ovarian cyst removal    . Knee arthroscopy  07/11/2012    Procedure: ARTHROSCOPY KNEE;  Surgeon: Cammy Copa, MD;  Location: [facility] OR;  Service: Orthopedics;  Laterality: Right;  Right knee arthroscopy with debridement    No family history on file.  History  Substance Use Topics  . Smoking status: Former Smoker -- 0.50 packs/day    Quit date: 01/21/2011  . Smokeless tobacco: Not on file     Comment: In Smoking Cessation Program on patches  . Alcohol Use: Yes     Comment: rarely    OB History   Grav Para Term Preterm Abortions TAB SAB Ect Mult Living                  Review of Systems   Constitutional: Negative for fever, chills and fatigue.  HENT: Negative for congestion, facial swelling, drooling, neck pain and dental problem.   Eyes: Negative for pain, discharge and itching.  Respiratory: Negative for cough, choking, wheezing and stridor.   Cardiovascular: Negative for chest pain.  Gastrointestinal: Negative for vomiting, abdominal pain and diarrhea.  Endocrine: Negative for cold intolerance and heat intolerance.  Genitourinary: Negative for vaginal discharge, difficulty urinating and vaginal pain.  Musculoskeletal: Positive for back pain.  Skin: Negative for pallor and rash.  Neurological: Negative for dizziness, light-headedness and headaches.       Back pain  Psychiatric/Behavioral: Negative for behavioral problems and agitation.    Allergies  Metoclopramide hcl; Morphine; and Toradol  Home Medications   Current Outpatient Rx  Name  Route  Sig  Dispense  Refill  . aspirin EC 81 MG tablet   Oral   Take 81 mg by mouth daily.         . DULoxetine (CYMBALTA) 60 MG capsule   Oral   Take 60 mg by mouth daily.         . fenofibrate 160 MG tablet   Oral   Take 160 mg by mouth every evening.          . hydrochlorothiazide (HYDRODIURIL) 25 MG tablet   Oral  Take 1 tablet (25 mg total) by mouth daily.   30 tablet   3   . ibuprofen (ADVIL,MOTRIN) 200 MG tablet   Oral   Take 400 mg by mouth every 6 (six) hours as needed. For pain         . metFORMIN (GLUCOPHAGE) 1000 MG tablet   Oral   Take 1,000 mg by mouth 2 (two) times daily with a meal.         . sitaGLIPtin (JANUVIA) 100 MG tablet   Oral   Take 1 tablet (100 mg total) by mouth daily.   30 tablet   6     BP 159/87  Temp(Src) 98.4 F (36.9 C) (Oral)  Resp 20  SpO2 97%  Physical Exam  Constitutional: She is oriented to person, place, and time. She appears well-developed. No distress.  HENT:  Head: Normocephalic and atraumatic.  Eyes: Pupils are equal, round, and reactive to  light. Right eye exhibits no discharge. Left eye exhibits no discharge.  Neck: Neck supple. No tracheal deviation present.  Cardiovascular: Normal rate.  Exam reveals no gallop and no friction rub.   Pulmonary/Chest: No stridor. No respiratory distress. She has no wheezes.  Abdominal: Soft. She exhibits no distension. There is no tenderness. There is no rebound.  Musculoskeletal: She exhibits no edema and no tenderness.  Lower L spine w/ mild ttp  Neurological: She is alert and oriented to person, place, and time. She has normal reflexes. No cranial nerve deficit. Coordination normal.  Pt is able to ambulate in the room without issues, normal heel to toe, 5/5 strength UE and LE, no sensory deficits. No saddle paresthesias   Skin: Skin is warm. She is not diaphoretic.    ED Course  Procedures (including critical care time)  Labs Reviewed - No data to display Dg Lumbar Spine 2-3 Views  03/08/2013   *RADIOLOGY REPORT*  Clinical Data: Injured while assisting a patient, now with low back pain  LUMBAR SPINE - 2-3 VIEW  Comparison: CT abdomen pelvis - 06/13/2012  Findings:  There are five non-rib bearing lumbar type vertebral bodies.  Normal alignment of the lumbar spine.  No anterolisthesis or retrolisthesis.  Lumbar vertebral body heights are preserved.  There is mild DDD at L4 - L5 with disc space height loss, end plate irregularity and sclerosis.  Limited visualization of the bilateral SI joints and hips is normal.  Regional bowel gas pattern is normal.  Regional soft tissues are normal.  IMPRESSION: 1.  No acute findings. 2.  Mild DDD at L4 - L5.   Original Report Authenticated By: Tacey Ruiz, MD     MDM  Pt with no neuro deficits on exam, doubt acute cord as pt does not have weakness, no urinary retention (able to urinate after incident without issues), ambulates with normal gait, normal heel to toe. Will get x-ray with complaints of lower back pain.   Imaging does not show acute pathology  such as fx.   Based on HPI, suspect that she has muscle strain / spasm as etiology of her symptoms. Will tx w/ valium.   Valium has improved symptoms, pt is able to easily ambulate in the room without issues, no neuro deficits, has been able to urinate without issues in the Er. She feels better. She is told to f/u with pcp tomorrow for re-evaluation of her symptoms.   1. Back muscle spasm            Teagen Mcleary Donette Larry, MD  03/08/13 1439 

## 2013-03-08 NOTE — ED Provider Notes (Signed)
I saw and evaluated the patient, reviewed the resident's note and I agree with the findings and plan.  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  Hurman Horn, MD 03/08/13 2232

## 2013-03-12 ENCOUNTER — Ambulatory Visit: Payer: Self-pay | Admitting: Internal Medicine

## 2013-03-21 ENCOUNTER — Encounter: Payer: Self-pay | Admitting: Dietician

## 2013-04-19 ENCOUNTER — Other Ambulatory Visit: Payer: Self-pay

## 2013-05-22 ENCOUNTER — Ambulatory Visit (INDEPENDENT_AMBULATORY_CARE_PROVIDER_SITE_OTHER): Payer: 59 | Admitting: Family Medicine

## 2013-05-22 ENCOUNTER — Encounter: Payer: Self-pay | Admitting: Internal Medicine

## 2013-05-22 ENCOUNTER — Ambulatory Visit (INDEPENDENT_AMBULATORY_CARE_PROVIDER_SITE_OTHER): Payer: 59 | Admitting: Internal Medicine

## 2013-05-22 VITALS — BP 118/80 | Wt 272.0 lb

## 2013-05-22 VITALS — BP 145/90 | HR 89 | Temp 97.1°F | Ht 66.0 in | Wt 274.6 lb

## 2013-05-22 DIAGNOSIS — E119 Type 2 diabetes mellitus without complications: Secondary | ICD-10-CM

## 2013-05-22 DIAGNOSIS — I10 Essential (primary) hypertension: Secondary | ICD-10-CM

## 2013-05-22 DIAGNOSIS — E781 Pure hyperglyceridemia: Secondary | ICD-10-CM

## 2013-05-22 DIAGNOSIS — G4730 Sleep apnea, unspecified: Secondary | ICD-10-CM

## 2013-05-22 DIAGNOSIS — R300 Dysuria: Secondary | ICD-10-CM

## 2013-05-22 DIAGNOSIS — Z299 Encounter for prophylactic measures, unspecified: Secondary | ICD-10-CM

## 2013-05-22 DIAGNOSIS — Z90710 Acquired absence of both cervix and uterus: Secondary | ICD-10-CM

## 2013-05-22 DIAGNOSIS — F32A Depression, unspecified: Secondary | ICD-10-CM

## 2013-05-22 DIAGNOSIS — F329 Major depressive disorder, single episode, unspecified: Secondary | ICD-10-CM

## 2013-05-22 LAB — POCT URINALYSIS DIPSTICK
Ketones, UA: NEGATIVE
Leukocytes, UA: NEGATIVE
Protein, UA: NEGATIVE
Spec Grav, UA: >=1.03
pH, UA: 5

## 2013-05-22 LAB — LIPID PANEL
Cholesterol: 231 mg/dL — ABNORMAL HIGH (ref 0–200)
Total CHOL/HDL Ratio: 7.2 Ratio

## 2013-05-22 MED ORDER — HYDROCHLOROTHIAZIDE 25 MG PO TABS: 25.0000 mg | ORAL_TABLET | Freq: Every day | ORAL | Status: DC

## 2013-05-22 MED ORDER — LISINOPRIL 5 MG PO TABS: 5.0000 mg | ORAL_TABLET | Freq: Every day | ORAL | Status: DC

## 2013-05-22 MED ORDER — METFORMIN HCL 1000 MG PO TABS: 1000.0000 mg | ORAL_TABLET | Freq: Two times a day (BID) | ORAL | Status: DC

## 2013-05-22 MED ORDER — SITAGLIPTIN PHOSPHATE 100 MG PO TABS: 100.0000 mg | ORAL_TABLET | Freq: Every day | ORAL | Status: DC

## 2013-05-22 MED ORDER — DULOXETINE HCL 60 MG PO CPEP: 60.0000 mg | ORAL_CAPSULE | Freq: Every day | ORAL | Status: DC

## 2013-05-22 NOTE — Assessment & Plan Note (Addendum)
BP Readings from Last 3 Encounters:  05/22/13 145/90  05/22/13 118/80  03/08/13 159/87    Lab Results  Component Value Date   NA 136 07/11/2012   K 3.7 07/11/2012   CREATININE 0.63 07/11/2012    Assessment: Pt still with hypertensive reads in recent past and also had elevated urine microalbumin:creatinine ratio of 38 in 12/13 therefore will add low-dose lisinopril today.   Blood pressure control: controlled Progress toward BP goal:  at goal  Plan: CMP today, add lisinopril per above Medications:  continue current medications in addition to lisinopril 5mg  daily

## 2013-05-22 NOTE — Assessment & Plan Note (Signed)
Pt currently using CPAP with no issues.

## 2013-05-22 NOTE — Assessment & Plan Note (Addendum)
Lab Results  Component Value Date   HGBA1C 9.6 05/22/2013   HGBA1C 9.5 09/18/2012   HGBA1C 8.6 04/26/2012     Assessment: A1C of 9.6% today is unchanged from prior in 12/13.  Pt is non-adherent with medications, reliably takes morning doses of metformin but forgets to take 2-3 doses per week of Januvia (which she currently takes at night) as well as evening doses of metformin.  Pt went to her "Link to Wellness" appt through family medicine this morning and spoke with the diabetes educator there.  She is not interested in meeting with Norm Parcel in our clinic. Normal foot exam today. Diabetes control: poor control (HgbA1C >9%) Progress toward A1C goal:  unchanged  Plan: Pt to work on medication compliance, will start taking her Januvia in the morning with her first dose of metformin to hopefully increase medication adherence.  She will work on her diet/portions (addressed at length this morning) and try to exercise more frequently. These changes may be more feasible once her depressive symptoms are improved.  We will also look into whether or not extended release metformin would be covered by the [facility] to help increase adherence with the full dose of this medication as well.  Pt will follow-up with her PCP in 2 months.  Medications:  continue current medications- metformin and Januvia (refilled both of these today) Home glucose monitoring: advised to check fasting blood sugar each morning  Frequency: no home glucose monitoring Timing: N/A Instruction/counseling given: reminded to bring medications to each visit

## 2013-05-22 NOTE — Progress Notes (Signed)
Patient presents today for 3 month diabetes follow-up as part of the employer-sponsored Link to Wellness program. Current diabetes regimen includes Metformin and Januvia. Patient also continues on daily ASA. She is not on an ACEi or statin at this time. Most recent MD follow-up was this past December and patient has follow-up scheduled for today. No med changes or major health changes at this time.   Patient had a dental appt yesterday for teeth cleaning. At this time her gums were found to be inflamed due to plaque build-up, and as a result they performed a debridement. Patient will follow-up in about 1 week to finish cleaning an polishing.   Diabetes Assessment: Type of Diabetes: Type 2; MD managing Diabetes Kristie Cowman, PCP; checks blood glucose Never; does not use glucometer; takes an aspirin a day; A1c 10.1 (prev 9.1 in 01/2013)  Other Diabetes History:  Current med regimen includes Januvia 100 mg daily and Metformin 1000 mg twice daily. Patient does not maintain good medication compliance and has struggled with this for quite some time. I have continued to encouraged patient to improve compliance for better control of glucose. Patient did not bring meter today and is not testing at this time, unless she is symptomatic, which is twice per week at the most. At this point we will focus on med compliance and revisit glucose testing at a later date. Patient denies symptoms of hypoglycemia. Patient denies signs and symptoms of neuropathy in feet. A1c today was 10.1 via point-of-care testing in pharmacy. Patient was very upset by this increased A1c as she feels she has made significant effort to improve diet over the past few months.  Lifestyle Factors: Diet - Patient has made some significant changes to diet; although she continues to have room for improvement. She is now limiting pasta to 1-2 nights per week (was eating pasta of some variety every night). In place of pasta she is eating roasted red  potatos, squash/zuccini sauteed in extra virgin olive oil. I have asked her to use caution with regard to portion size of potatoes. She is attempting to do better with snack choices, but still enjoys creamsicle icecream bars 1-2 times per week. She reports eating much less chips and dip and is now snacking on more carrots and celery. CheezIts continue to be a weakness but we have discussed striving for a more appropriate portion size. On a postivie note, patient is eating far less candy and candy bars than she has in the past.  Exercise - No routine exercise. Primarily because her grandson was born about 1 month ago. She still walks the dogs on occassion, but would like to get back on track with doing this more routinely.   Assessment: Pt presents today for DM follow-up. She remains unchanged with regard to med compliance and exercise. She has made some improvements to diet and seems comitted to maintaining these changes. As a result, A1c is elevated above goal and is 10.1 at this visit. We have continued to set goals, with primary focus being medication compliance. Patient will follow-up with MD today and return for LTW follow-up in 12 weeks.  Plan: 1) Maintain dietary changes, especially try to continue limiting pasta and starches, and making healthy snack choices. Pay careful attention to portion sizes of snacks.  2) Resume walking dogs regularly 3) Attempt to improve medication compliance 4) Follow-up in 3 months on Tuesday November 11th @ 11:00 am

## 2013-05-22 NOTE — Assessment & Plan Note (Addendum)
Pt not on medication for this issue (has taken fenofibrate in past). FLP today.  ADDENDUM: Direct LDL 129 on 8/12 labs (goal <100).  Pt will need repeat CMP at next visit to check liver enzymes (slightly elevated on 8/12 labs) followed by initiation of statin therapy.

## 2013-05-22 NOTE — Assessment & Plan Note (Addendum)
Pt states she would be interested in pneumovax at her next visit but not today given her depressive symptoms and aversion to needles.  Pt is s/p vaginal hysterectomy, unilateral oophorectomy thus does not need Pap smears.  We should get the OBGYN records to clarify indication for hysterectomy at her next visit.

## 2013-05-22 NOTE — Assessment & Plan Note (Signed)
>>  ASSESSMENT AND PLAN FOR ESSENTIAL HYPERTENSION WRITTEN ON 05/22/2013  6:01 PM BY Rocco Serene, MD  BP Readings from Last 3 Encounters:  05/22/13 145/90  05/22/13 118/80  03/08/13 159/87    Lab Results  Component Value Date   NA 136 07/11/2012   K 3.7 07/11/2012   CREATININE 0.63 07/11/2012    Assessment: Pt still with hypertensive reads in recent past and also had elevated urine microalbumin:creatinine ratio of 38 in 12/13 therefore will add low-dose lisinopril today.   Blood pressure control: controlled Progress toward BP goal:  at goal  Plan: CMP today, add lisinopril per above Medications:  continue current medications in addition to lisinopril 5mg  daily

## 2013-05-22 NOTE — Assessment & Plan Note (Signed)
Pt working on weight loss through diet.  Provided information on diabetic diet in AVS today.

## 2013-05-22 NOTE — Progress Notes (Signed)
Patient ID: Renee Ho, female   DOB: Dec 11, 1975, 37 y.o.   MRN: [id number]   Subjective:   Patient ID: Renee Ho female   DOB: 1976/05/26 37 y.o.   MRN: [id number]  HPI: [name] is a 37 y.o. woman with history of uncontrolled DM2, HTN, hypertriglyceridemia, PCOS who presents for medication refill, especially given increased symptoms of depression.  She also follows with family medicine through their "Link to Wellness" program and went there earlier today.   Pt states that she has been much more depressed since she ran out of her Cymbalta over two weeks ago.  Specifically, she has been sleeping much more than usual (has slept all day today except when she got up to come to her two doctor's appointments), she has had little interest in doing things she usually enjoys, she has felt guilty for not caring for her 73 month old grandson better and adds that he has been getting on her last nerve lately, she has had decreased energy, she has not been concentrating as well as usual, she has had decreased appetite, notes that she has not eaten today.  She does not have psychomotor abnormalities and denies suicidal ideation.    In terms of her diabetes, pt states she does not take her medications as prescribed.  It is very difficult for her to remember to take the evening dose of metformin along with her daily dose of Junuvia which she currently takes at night.  She states that she misses 2-3 doses of these medications per week.  She does not check her blood sugar at home unless she feels that her blood sugar is high because she "does not know what the point is" when she is not going to do anything differently based on the number.  She checked her blood sugar during one of these episodes about 2 weeks ago, it was 386 at that time.  She denies tremulousness, diaphoresis, blurry vision, AMS, polydipsia.  She had an eye exam earlier this year which showed no evidence of diabetic retinopathy.    Pt  does endorse some dysuria and urinary frequency for the last 2-3 days.  She denies fever/chills, urgency, hematuria, foul-smelling urine.  She has not had any vaginal discharge or new sexual partners.  Concerned she may have a UTI.   Pt does not check her blood pressure at home.  She takes her HCTZ in the morning (along with her Cymbalta and first dose of metformin) and thus reports good medication compliance.    Past Medical History  Diagnosis Date  . Diabetes mellitus   . Diverticula of intestine   . High cholesterol   . Polycystic ovarian syndrome   . Hypertension   . Sleep apnea    Current Outpatient Prescriptions  Medication Sig Dispense Refill  . aspirin EC 81 MG tablet Take 81 mg by mouth daily.      . DULoxetine (CYMBALTA) 60 MG capsule Take 1 capsule (60 mg total) by mouth daily.  30 capsule  5  . hydrochlorothiazide (HYDRODIURIL) 25 MG tablet Take 1 tablet (25 mg total) by mouth daily.  30 tablet  3  . metFORMIN (GLUCOPHAGE) 1000 MG tablet Take 1 tablet (1,000 mg total) by mouth 2 (two) times daily with a meal.  60 tablet  5  . sitaGLIPtin (JANUVIA) 100 MG tablet Take 1 tablet (100 mg total) by mouth daily.  30 tablet  5  . lisinopril (PRINIVIL,ZESTRIL) 5 MG tablet Take 1 tablet (5  mg total) by mouth daily.  30 tablet  5   No current facility-administered medications for this visit.   No family history on file. History   Social History  . Marital Status: Single    Spouse Name: N/A    Number of Children: N/A  . Years of Education: N/A   Social History Main Topics  . Smoking status: Former Smoker -- 0.50 packs/day    Quit date: 01/21/2011  . Smokeless tobacco: None     Comment: In Smoking Cessation Program on patches  . Alcohol Use: No  . Drug Use: No  . Sexually Active: None   Other Topics Concern  . None   Social History Narrative  . None   Review of Systems: Review of Systems  Constitutional: Positive for malaise/fatigue. Negative for fever, chills and  diaphoresis.  HENT: Negative for congestion and sore throat.   Eyes: Negative for blurred vision.  Respiratory: Negative for cough and shortness of breath.   Cardiovascular: Negative for chest pain, palpitations and leg swelling.  Gastrointestinal: Negative for heartburn, nausea, vomiting, abdominal pain, diarrhea, constipation and blood in stool.  Genitourinary: Positive for dysuria and frequency. Negative for urgency, hematuria and flank pain.  Musculoskeletal: Negative for myalgias, back pain and falls.  Neurological: Positive for headaches. Negative for dizziness, focal weakness, loss of consciousness and weakness.  Endo/Heme/Allergies: Negative for polydipsia.  Psychiatric/Behavioral: Positive for depression. Negative for suicidal ideas.    Objective:  Physical Exam: Filed Vitals:   05/22/13 1536  BP: 145/90  Pulse: 89  Temp: 97.1 F (36.2 C)  TempSrc: Oral  Height: 5\' 6"  (1.676 m)  Weight: 274 lb 9.6 oz (124.558 kg)  SpO2: 97%   General: alert, cooperative, pt tearful during part of interview HEENT: vision grossly intact, oropharynx clear and non-erythematous  Neck: supple, no lymphadenopathy, JVD, or carotid bruits Lungs: clear to ascultation bilaterally, normal work of respiration, no wheezes, rales, ronchi Heart: regular rate and rhythm, no murmurs, gallops, or rubs Abdomen: soft, non-tender, non-distended, normal bowel sounds Extremities: 2+ DP/PT pulses bilaterally, no cyanosis, clubbing, or edema Neurologic: alert & oriented X3, cranial nerves II-XII intact, strength grossly intact, sensation intact to light touch   Assessment & Plan:  Patient discussed with Dr. Criselda Peaches.  Please see problem-based assessment and plan.

## 2013-05-22 NOTE — Assessment & Plan Note (Signed)
On ROS, pt endorsed dysuria and ?frequency x 2-3 days.  No fevers/chills, urgency, hematuria, foul smelling urine.  Urine dip negative thus dysuria likely 2/2 irritation from glucosuria.  -re-evaluate at next visit

## 2013-05-22 NOTE — Patient Instructions (Addendum)
Please follow-up with Dr. Bosie Clos or Dr. Aundria Rud in 2 months.   Try to remember to take all of your medicines as prescribed.  We re-filled all of your medicines today and added on one new medication called lisinopril.   Try to work on exercise and diet as described below to decrease your blood sugars.   Keep up the good work on your blood pressure!  Please let us know if you feel that your depression does not improve after re-starting your anti-depressant medication Cymbalta.   Diabetes Meal Planning Guide The diabetes meal planning guide is a tool to help you plan your meals and snacks. It is important for people with diabetes to manage their blood glucose (sugar) levels. Choosing the right foods and the right amounts throughout your day will help control your blood glucose. Eating right can even help you improve your blood pressure and reach or maintain a healthy weight. CARBOHYDRATE COUNTING MADE EASY When you eat carbohydrates, they turn to sugar. This raises your blood glucose level. Counting carbohydrates can help you control this level so you feel better. When you plan your meals by counting carbohydrates, you can have more flexibility in what you eat and balance your medicine with your food intake. Carbohydrate counting simply means adding up the total amount of carbohydrate grams in your meals and snacks. Try to eat about the same amount at each meal. Foods with carbohydrates are listed below. Each portion below is 1 carbohydrate serving or 15 grams of carbohydrates. Ask your dietician how many grams of carbohydrates you should eat at each meal or snack. Grains and Starches  1 slice bread.   English muffin or hotdog/hamburger bun.   cup cold cereal (unsweetened).   cup cooked pasta or rice.   cup starchy vegetables (corn, potatoes, peas, beans, winter squash).  1 tortilla (6 inches).   bagel.  1 waffle or pancake (size of a CD).   cup cooked cereal.  4 to 6 small  crackers. *Whole grain is recommended. Fruit  1 cup fresh unsweetened berries, melon, papaya, pineapple.  1 small fresh fruit.   banana or mango.   cup fruit juice (4 oz unsweetened).   cup canned fruit in natural juice or water.  2 tbs dried fruit.  12 to 15 grapes or cherries. Milk and Yogurt  1 cup fat-free or 1% milk.  1 cup soy milk.  6 oz light yogurt with sugar-free sweetener.  6 oz low-fat soy yogurt.  6 oz plain yogurt. Vegetables  1 cup raw or  cup cooked is counted as 0 carbohydrates or a "free" food.  If you eat 3 or more servings at 1 meal, count them as 1 carbohydrate serving. Other Carbohydrates   oz chips or pretzels.   cup ice cream or frozen yogurt.   cup sherbet or sorbet.  2 inch square cake, no frosting.  1 tbs honey, sugar, jam, jelly, or syrup.  2 small cookies.  3 squares of graham crackers.  3 cups popcorn.  6 crackers.  1 cup broth-based soup.  Count 1 cup casserole or other mixed foods as 2 carbohydrate servings.  Foods with less than 20 calories in a serving may be counted as 0 carbohydrates or a "free" food. You may want to purchase a book or computer software that lists the carbohydrate gram counts of different foods. In addition, the nutrition facts panel on the labels of the foods you eat are a good source of this information. The label will  tell you how big the serving size is and the total number of carbohydrate grams you will be eating per serving. Divide this number by 15 to obtain the number of carbohydrate servings in a portion. Remember, 1 carbohydrate serving equals 15 grams of carbohydrate. SERVING SIZES Measuring foods and serving sizes helps you make sure you are getting the right amount of food. The list below tells how big or small some common serving sizes are.  1 oz.........4 stacked dice.  3 oz........Marland KitchenDeck of cards.  1 tsp.......Marland KitchenTip of little finger.  1 tbs......Marland KitchenMarland KitchenThumb.  2 tbs.......Marland KitchenGolf  ball.   cup......Marland KitchenHalf of a fist.  1 cup.......Marland KitchenA fist. SAMPLE DIABETES MEAL PLAN Below is a sample meal plan that includes foods from the grain and starches, dairy, vegetable, fruit, and meat groups. A dietician can individualize a meal plan to fit your calorie needs and tell you the number of servings needed from each food group. However, controlling the total amount of carbohydrates in your meal or snack is more important than making sure you include all of the food groups at every meal. You may interchange carbohydrate containing foods (dairy, starches, and fruits). The meal plan below is an example of a 2000 calorie diet using carbohydrate counting. This meal plan has 17 carbohydrate servings. Breakfast  1 cup oatmeal (2 carb servings).   cup light yogurt (1 carb serving).  1 cup blueberries (1 carb serving).   cup almonds. Snack  1 large apple (2 carb servings).  1 low-fat string cheese stick. Lunch  Chicken breast salad.  1 cup spinach.   cup chopped tomatoes.  2 oz chicken breast, sliced.  2 tbs low-fat [place] dressing.  12 whole-wheat crackers (2 carb servings).  12 to 15 grapes (1 carb serving).  1 cup low-fat milk (1 carb serving). Snack  1 cup carrots.   cup hummus (1 carb serving). Dinner  3 oz broiled salmon.  1 cup brown rice (3 carb servings). Snack  1  cups steamed broccoli (1 carb serving) drizzled with 1 tsp olive oil and lemon juice.  1 cup light pudding (2 carb servings). DIABETES MEAL PLANNING WORKSHEET Your dietician can use this worksheet to help you decide how many servings of foods and what types of foods are right for you.  BREAKFAST Food Group and Servings / Carb Servings Grain/Starches __________________________________ Dairy __________________________________________ Vegetable ______________________________________ Fruit ___________________________________________ Meat __________________________________________ Fat  ____________________________________________ LUNCH Food Group and Servings / Carb Servings Grain/Starches ___________________________________ Dairy ___________________________________________ Fruit ____________________________________________ Meat ___________________________________________ Fat _____________________________________________ Laural Golden Food Group and Servings / Carb Servings Grain/Starches ___________________________________ Dairy ___________________________________________ Fruit ____________________________________________ Meat ___________________________________________ Fat _____________________________________________ SNACKS Food Group and Servings / Carb Servings Grain/Starches ___________________________________ Dairy ___________________________________________ Vegetable _______________________________________ Fruit ____________________________________________ Meat ___________________________________________ Fat _____________________________________________ DAILY TOTALS Starches _________________________ Vegetable ________________________ Fruit ____________________________ Dairy ____________________________ Meat ____________________________ Fat ______________________________ Document Released: 06/24/2005 Document Revised: 12/20/2011 Document Reviewed: 05/05/2009 [employer] Patient Information 2014 [employer]

## 2013-05-22 NOTE — Assessment & Plan Note (Signed)
Pt's depressive symptoms have returned since being out of her Cymbalta for past two weeks.  See HPI, tearful during interview.   -restart Cymbalta at her chronic low dose of 60mg  daily -pt instructed to seek immediate medical attention if her symptoms worsen or if she experiences SI

## 2013-05-23 LAB — COMPLETE METABOLIC PANEL WITH GFR
BUN: 12 mg/dL (ref 6–23)
CO2: 26 mEq/L (ref 19–32)
Creat: 0.69 mg/dL (ref 0.50–1.10)
GFR, Est African American: >89 mL/min
GFR, Est Non African American: >89 mL/min
Glucose, Bld: 249 mg/dL — ABNORMAL HIGH (ref 70–99)
Total Bilirubin: 0.9 mg/dL (ref 0.3–1.2)

## 2013-05-23 LAB — LDL CHOLESTEROL, DIRECT: Direct LDL: 129 mg/dL — ABNORMAL HIGH

## 2013-05-23 NOTE — Addendum Note (Signed)
Addended by: Leanora Cover on: 05/23/2013 01:10 PM   Modules accepted: Orders

## 2013-05-25 NOTE — Progress Notes (Signed)
I saw and evaluated the patient.  I personally confirmed the key portions of the history and exam documented by Dr. Rogers and I reviewed pertinent patient test results.  The assessment, diagnosis, and plan were formulated together and I agree with the documentation in the resident's note. 

## 2013-06-28 NOTE — Progress Notes (Signed)
Patient ID: Renee Ho, female   DOB: 05/03/1976, 37 y.o.   MRN: [id number] ATTENDING PHYSICIAN NOTE: I have reviewed the chart and agree with the plan as detailed above. Maysen Bonsignore MD Pager [phone]  

## 2013-07-10 ENCOUNTER — Emergency Department (HOSPITAL_COMMUNITY)
Admission: EM | Admit: 2013-07-10 | Discharge: 2013-07-11 | Disposition: A | Discharge status: Home / Self Care | Payer: 59 | Attending: Emergency Medicine | Admitting: Emergency Medicine

## 2013-07-10 ENCOUNTER — Encounter (HOSPITAL_COMMUNITY): Payer: Self-pay | Admitting: *Deleted

## 2013-07-10 DIAGNOSIS — R509 Fever, unspecified: Secondary | ICD-10-CM | POA: Insufficient documentation

## 2013-07-10 DIAGNOSIS — R739 Hyperglycemia, unspecified: Secondary | ICD-10-CM

## 2013-07-10 DIAGNOSIS — Z8719 Personal history of other diseases of the digestive system: Secondary | ICD-10-CM | POA: Insufficient documentation

## 2013-07-10 DIAGNOSIS — Z87891 Personal history of nicotine dependence: Secondary | ICD-10-CM | POA: Insufficient documentation

## 2013-07-10 DIAGNOSIS — B9789 Other viral agents as the cause of diseases classified elsewhere: Secondary | ICD-10-CM | POA: Insufficient documentation

## 2013-07-10 DIAGNOSIS — Z79899 Other long term (current) drug therapy: Secondary | ICD-10-CM | POA: Insufficient documentation

## 2013-07-10 DIAGNOSIS — B349 Viral infection, unspecified: Secondary | ICD-10-CM

## 2013-07-10 DIAGNOSIS — R52 Pain, unspecified: Secondary | ICD-10-CM | POA: Insufficient documentation

## 2013-07-10 DIAGNOSIS — R109 Unspecified abdominal pain: Secondary | ICD-10-CM | POA: Insufficient documentation

## 2013-07-10 DIAGNOSIS — E119 Type 2 diabetes mellitus without complications: Secondary | ICD-10-CM | POA: Insufficient documentation

## 2013-07-10 DIAGNOSIS — Z7982 Long term (current) use of aspirin: Secondary | ICD-10-CM | POA: Insufficient documentation

## 2013-07-10 DIAGNOSIS — I10 Essential (primary) hypertension: Secondary | ICD-10-CM | POA: Insufficient documentation

## 2013-07-10 MED ORDER — ACETAMINOPHEN 325 MG PO TABS
650.0000 mg | ORAL_TABLET | Freq: Once | ORAL | Status: AC
  Administered 2013-07-11: 650 mg via ORAL
  Filled 2013-07-10: qty 2

## 2013-07-10 MED ORDER — ONDANSETRON HCL 4 MG/2ML IJ SOLN
INTRAMUSCULAR | Status: AC
  Filled 2013-07-10: qty 2

## 2013-07-10 MED ORDER — ONDANSETRON HCL 4 MG/2ML IJ SOLN
4.0000 mg | Freq: Once | INTRAMUSCULAR | Status: AC
  Administered 2013-07-11: 4 mg via INTRAVENOUS

## 2013-07-10 NOTE — ED Notes (Signed)
Patient presents with c/o fever, chills, body aches and increased blood sugar (400+)  Patient took 2 Metformin 1000mg  for the increased sugar

## 2013-07-11 ENCOUNTER — Emergency Department (HOSPITAL_COMMUNITY): Payer: 59

## 2013-07-11 ENCOUNTER — Encounter (HOSPITAL_COMMUNITY): Payer: Self-pay | Admitting: Radiology

## 2013-07-11 LAB — CBC WITH DIFFERENTIAL/PLATELET
Basophils Absolute: 0 10*3/uL (ref 0.0–0.1)
Lymphocytes Relative: 7 % — ABNORMAL LOW (ref 12–46)
Lymphs Abs: 0.8 10*3/uL (ref 0.7–4.0)
Neutrophils Relative %: 86 % — ABNORMAL HIGH (ref 43–77)
Platelets: 128 10*3/uL — ABNORMAL LOW (ref 150–400)
RBC: 4.71 MIL/uL (ref 3.87–5.11)
RDW: 13.5 % (ref 11.5–15.5)
WBC: 10.8 10*3/uL — ABNORMAL HIGH (ref 4.0–10.5)

## 2013-07-11 LAB — URINALYSIS, ROUTINE W REFLEX MICROSCOPIC
Bilirubin Urine: NEGATIVE
Glucose, UA: >1000 mg/dL — AB
Hgb urine dipstick: NEGATIVE
Specific Gravity, Urine: 1.043 — ABNORMAL HIGH (ref 1.005–1.030)
Urobilinogen, UA: 0.2 mg/dL (ref 0.0–1.0)

## 2013-07-11 LAB — URINE MICROSCOPIC-ADD ON

## 2013-07-11 LAB — COMPREHENSIVE METABOLIC PANEL
ALT: 33 U/L (ref 0–35)
AST: 30 U/L (ref 0–37)
Alkaline Phosphatase: 53 U/L (ref 39–117)
CO2: 20 mEq/L (ref 19–32)
GFR calc Af Amer: >90 mL/min (ref >90)
GFR calc non Af Amer: >90 mL/min (ref >90)
Glucose, Bld: 318 mg/dL — ABNORMAL HIGH (ref 70–99)
Potassium: 4.1 mEq/L (ref 3.5–5.1)
Sodium: 135 mEq/L (ref 135–145)
Total Protein: 6.8 g/dL (ref 6.0–8.3)

## 2013-07-11 LAB — GLUCOSE, CAPILLARY: Glucose-Capillary: 328 mg/dL — ABNORMAL HIGH (ref 70–99)

## 2013-07-11 MED ORDER — INSULIN ASPART 100 UNIT/ML ~~LOC~~ SOLN
5.0000 [IU] | Freq: Once | SUBCUTANEOUS | Status: AC
  Administered 2013-07-11: 5 [IU] via INTRAVENOUS
  Filled 2013-07-11: qty 1

## 2013-07-11 MED ORDER — IOHEXOL 300 MG/ML  SOLN
100.0000 mL | Freq: Once | INTRAMUSCULAR | Status: AC | PRN
  Administered 2013-07-11: 100 mL via INTRAVENOUS

## 2013-07-11 MED ORDER — ONDANSETRON HCL 4 MG/2ML IJ SOLN
4.0000 mg | Freq: Once | INTRAMUSCULAR | Status: AC
  Administered 2013-07-11: 4 mg via INTRAVENOUS
  Filled 2013-07-11: qty 2

## 2013-07-11 MED ORDER — SODIUM CHLORIDE 0.9 % IV SOLN
Freq: Once | INTRAVENOUS | Status: AC
  Administered 2013-07-11: via INTRAVENOUS

## 2013-07-11 MED ORDER — HYDROMORPHONE HCL PF 1 MG/ML IJ SOLN
1.0000 mg | Freq: Once | INTRAMUSCULAR | Status: AC
  Administered 2013-07-11: 1 mg via INTRAVENOUS
  Filled 2013-07-11: qty 1

## 2013-07-11 MED ORDER — HYDROCODONE-ACETAMINOPHEN 5-325 MG PO TABS: 2.0000 | ORAL_TABLET | ORAL | Status: DC | PRN

## 2013-07-11 MED ORDER — SODIUM CHLORIDE 0.9 % IV SOLN
Freq: Once | INTRAVENOUS | Status: AC
  Administered 2013-07-11: 02:00:00 via INTRAVENOUS

## 2013-07-11 NOTE — ED Provider Notes (Signed)
CSN: [id number]     Arrival date & time 07/10/13  2326 History   First MD Initiated Contact with Patient 07/11/13 0017     Chief Complaint  Patient presents with  . Generalized Body Aches  . Fever   (Consider location/radiation/quality/duration/timing/severity/associated sxs/prior Treatment) HPI Comments: Patient with history of diabetes, hypertension, high cholesterol results with complaints of fever and generalized body pain for the past 24 hours. She states her sugars have been running over 400. She denies chest pain or productive cough. She denies abdominal pain. She has had some nausea but no vomiting or diarrhea. She denies urinary complaints  Patient is a 37 y.o. female presenting with fever. The history is provided by the patient.  Fever Max temp prior to arrival:  102 Severity:  Moderate Onset quality:  Sudden Duration:  24 hours Timing:  Constant Progression:  Worsening Chronicity:  New Relieved by:  Nothing Worsened by:  Nothing tried Ineffective treatments:  None tried Associated symptoms: chills     Past Medical History  Diagnosis Date  . Diabetes mellitus   . Diverticula of intestine   . High cholesterol   . Polycystic ovarian syndrome   . Hypertension   . Sleep apnea    Past Surgical History  Procedure Laterality Date  . Abdominal hysterectomy    . Ovarian cyst removal    . Knee arthroscopy  07/11/2012    Procedure: ARTHROSCOPY KNEE;  Surgeon: Cammy Copa, MD;  Location: [facility] OR;  Service: Orthopedics;  Laterality: Right;  Right knee arthroscopy with debridement   History reviewed. No pertinent family history. History  Substance Use Topics  . Smoking status: Former Smoker -- 0.50 packs/day    Quit date: 01/21/2011  . Smokeless tobacco: Not on file     Comment: In Smoking Cessation Program on patches  . Alcohol Use: No   OB History   Grav Para Term Preterm Abortions TAB SAB Ect Mult Living                 Review of Systems  Constitutional:  Positive for fever and chills.  All other systems reviewed and are negative.    Allergies  Metoclopramide hcl; Morphine; and Toradol  Home Medications   Current Outpatient Rx  Name  Route  Sig  Dispense  Refill  . aspirin EC 81 MG tablet   Oral   Take 81 mg by mouth daily.         . DULoxetine (CYMBALTA) 60 MG capsule   Oral   Take 1 capsule (60 mg total) by mouth daily.   30 capsule   5   . hydrochlorothiazide (HYDRODIURIL) 25 MG tablet   Oral   Take 1 tablet (25 mg total) by mouth daily.   30 tablet   3   . lisinopril (PRINIVIL,ZESTRIL) 5 MG tablet   Oral   Take 1 tablet (5 mg total) by mouth daily.   30 tablet   5   . metFORMIN (GLUCOPHAGE) 1000 MG tablet   Oral   Take 1 tablet (1,000 mg total) by mouth 2 (two) times daily with a meal.   60 tablet   5   . sitaGLIPtin (JANUVIA) 100 MG tablet   Oral   Take 1 tablet (100 mg total) by mouth daily.   30 tablet   5    BP 104/54  Pulse 102  Temp(Src) 100 F (37.8 C) (Oral)  Resp 18  SpO2 95% Physical Exam  Nursing note and vitals reviewed.  Constitutional: She is oriented to person, place, and time. She appears well-developed and well-nourished. No distress.  Patient is a 37 year old female appears uncomfortable but is otherwise alert and appropriate for  HENT:  Head: Normocephalic and atraumatic.  Mouth/Throat: Oropharynx is clear and moist.  Neck: Normal range of motion. Neck supple.  Cardiovascular: Normal rate, regular rhythm and normal heart sounds.   No murmur heard. Pulmonary/Chest: Effort normal and breath sounds normal. No respiratory distress. She has no wheezes.  Abdominal: Soft. Bowel sounds are normal. She exhibits no distension. There is no tenderness.  Musculoskeletal: Normal range of motion. She exhibits no edema.  Lymphadenopathy:    She has no cervical adenopathy.  Neurological: She is alert and oriented to person, place, and time. No cranial nerve deficit. She exhibits normal muscle  tone. Coordination normal.  Skin: Skin is warm and dry. She is not diaphoretic.    ED Course  Procedures (including critical care time) Labs Review Labs Reviewed  CBC WITH DIFFERENTIAL - Abnormal; Notable for the following:    WBC 10.8 (*)    Hemoglobin 15.2 (*)    MCHC 36.6 (*)    Platelets 128 (*)    Neutrophils Relative % 86 (*)    Neutro Abs 9.3 (*)    Lymphocytes Relative 7 (*)    All other components within normal limits  COMPREHENSIVE METABOLIC PANEL - Abnormal; Notable for the following:    Glucose, Bld 318 (*)    Calcium 8.2 (*)    Albumin 3.4 (*)    All other components within normal limits  URINALYSIS, ROUTINE W REFLEX MICROSCOPIC - Abnormal; Notable for the following:    Specific Gravity, Urine 1.043 (*)    Glucose, UA >1000 (*)    All other components within normal limits  URINE MICROSCOPIC-ADD ON - Abnormal; Notable for the following:    Casts HYALINE CASTS (*)    All other components within normal limits  GLUCOSE, CAPILLARY - Abnormal; Notable for the following:    Glucose-Capillary 328 (*)    All other components within normal limits  KETONES, QUALITATIVE   Imaging Review Dg Chest 2 View  07/11/2013   CLINICAL DATA:  Weakness and fever.  EXAM: CHEST  2 VIEW  COMPARISON:  07/19/2009.  FINDINGS: The cardiac silhouette, mediastinal and hilar contours are within normal limits and stable. The lungs demonstrate peribronchial thickening and increased interstitial markings which could suggest bronchitis or interstitial pneumonitis. No infiltrates or effusions. The bony thorax is intact.  IMPRESSION: Bronchitic lung changes could suggest bronchitis or interstitial pneumonitis. No focal infiltrates.   Electronically Signed   By: Loralie Champagne M.D.   On: 07/11/2013 01:09   Ct Abdomen Pelvis W Contrast  07/11/2013   CLINICAL DATA:  Fever, chills, body aches.  EXAM: CT ABDOMEN AND PELVIS WITH CONTRAST  TECHNIQUE: Multidetector CT imaging of the abdomen and pelvis was  performed using the standard protocol following bolus administration of intravenous contrast.  CONTRAST:  OMNIPAQUE IOHEXOL 300 MG/ML  SOLN  COMPARISON:  06/13/2012  FINDINGS: Dependent and bibasilar atelectasis. No pleural effusions. Heart is normal size.  There is diffuse fatty infiltration of the liver. Her gallbladder, spleen, pancreas, adrenals and kidneys are normal. Appendix is visualized and is normal. Prior hysterectomy. Bowel grossly unremarkable. No free fluid, free air, or adenopathy.  Again noted is soft tissue fullness in the left adnexal region measuring up to 5.5 cm compared to 5.3 cm, not significantly changed.  No acute bony abnormality.  IMPRESSION:  Mild fatty infiltration of the liver.  Stable left adnexal soft tissue. Prior hysterectomy.  No acute findings.   Electronically Signed   By: Charlett Nose M.D.   On: 07/11/2013 04:53    MDM  No diagnosis found. Patient presents here with complaints of fever, myalgias, and generalized malaise for the past 24 hours. She had no specific complaints, but due to her report discomfort a workup was initiated into this. Urinalysis chest x-ray and laboratory studies were obtained. All these were essentially unremarkable with the exception of a mildly elevated white count of 10.8. Her glucose was elevated at 318 she will be given insulin for this. Chest x-ray does not reveal pneumonia and urinalysis has not suggestive of a urinary tract infection. CT of the abdomen and pelvis was obtained as well and did not reveal any diverticulitis or other acute intra-abdominal process. After thorough workup has been unable to identify an emergent cause of her fever and malaise. At this point I feel as though she is stable for discharge. She will be recommended to take ibuprofen as needed for her fever and return to the emergency department if she develops worsening of her symptoms.    Geoffery Lyons, MD 07/11/13 [phone]

## 2013-07-17 ENCOUNTER — Encounter: Payer: Self-pay | Admitting: *Deleted

## 2013-07-17 NOTE — Progress Notes (Signed)
Pt called with c/o wound to L heel. She tripped and caught her heel on nail, area is now red and not healing.  Pt is Diabetic. Onset 2 days ago.  Will see tomorrow.

## 2013-07-18 ENCOUNTER — Ambulatory Visit (INDEPENDENT_AMBULATORY_CARE_PROVIDER_SITE_OTHER): Payer: 59 | Admitting: Internal Medicine

## 2013-07-18 VITALS — BP 116/76 | HR 72 | Temp 97.9°F | Ht 66.0 in | Wt 275.7 lb

## 2013-07-18 DIAGNOSIS — S99922A Unspecified injury of left foot, initial encounter: Secondary | ICD-10-CM | POA: Insufficient documentation

## 2013-07-18 DIAGNOSIS — Z23 Encounter for immunization: Secondary | ICD-10-CM

## 2013-07-18 DIAGNOSIS — S90812A Abrasion, left foot, initial encounter: Secondary | ICD-10-CM

## 2013-07-18 DIAGNOSIS — S8990XA Unspecified injury of unspecified lower leg, initial encounter: Secondary | ICD-10-CM

## 2013-07-18 DIAGNOSIS — S99919A Unspecified injury of unspecified ankle, initial encounter: Secondary | ICD-10-CM

## 2013-07-18 DIAGNOSIS — E119 Type 2 diabetes mellitus without complications: Secondary | ICD-10-CM

## 2013-07-18 DIAGNOSIS — IMO0002 Reserved for concepts with insufficient information to code with codable children: Secondary | ICD-10-CM

## 2013-07-18 NOTE — Assessment & Plan Note (Signed)
Minor heel avulsion on left from nail w/ no signs or symptoms of infxn.  1. Given no signs/symptoms of infxn, antibiotics not necessary at this time. Advised pt to seek immediate care if she develops fever, increased pain, redness, pus, swelling at wound site.  2. Unknown Tdap status. Given recent wound from nail, tetanus prophylaxis is warranted. Tdap given in clinic today.

## 2013-07-18 NOTE — Assessment & Plan Note (Signed)
Currently uncontrolled, but has f/u appt related to diabetes on 10/11. No further management necessary at this time. 1. Has not received pneumococcal vaccination. Given diabetes, pt is at higher risk for pneumococcal disease and therefore vaccination was advised. Pt agreed and received vaccination today.

## 2013-07-18 NOTE — Progress Notes (Signed)
Subjective:     Patient ID: Renee Ho, female   DOB: 1976/02/27, 37 y.o.   MRN: [id number]  HPI This is a 37 yo F w/ hx of T2DM, presenting today for a cut to her foot. The wound is on her heel and is from a nail off of her deck that she stepped on this past Sunday (10/5). She stated that she stepped on the flat end of the nail, as it was sticking out sideways underneath a lip on her deck. She denies any embedded objects in the wound. Pt has cleaned the wound with soap and water, but is concerned given her diabetes that it may be infected or not healing properly. She reports pain in the area surrounding the wound, which worsens when she applies pressure, e.g. Walking. Pt further states that while painful, it is something that she "can deal with."   Last week on Tues (9/30) she reports being in the ED for a viral illness assoc w/ a fever of 102.9 F, and elevated blood sugar of 452. Since then she has been doing well.   Since the wound she has not had any fevers, chills, or sweats. She also denies any pus at the site, increased warmth, or redness. No N/V, abdominal pain, or changes in BMs or urination.  Her tetanus vaccination status is unknown, and pt has not received pneumococcal vaccine. She reports receiving her flu shot yesterday.  Pt states that her blood sugars usually run in the 200s. She has an appt on 10/11 w/ [facility] to check her A1c and for further adjustment in her diabetes medication.   Review of Systems As per HPI    Objective:   Physical Exam  Constitutional: She is oriented to person, place, and time.  Well-appearing, obese, no acute distress  HENT:  Head: Normocephalic and atraumatic.  Pulmonary/Chest: Effort normal.  Musculoskeletal: Normal range of motion. She exhibits no edema.  Left heel shows a clean, scabbed over and dry avulsion approx. 1 cm in size. There is no attached skin to the wound. No pus or exudates coming out of the site. No surrounding erythema.  Slight warmness to touch in surrounding area, however, this is comparable to the right side. Pain to palpation on left sole of foot surrounding wound and distributed in the region of her calcaneus. Feet dry and rough bilaterally. No other lesions, ulcerations or calluses noted bilaterally. 2+ dorsalis pedis and posterior tibialis pulses bilaterally.  Neurological: She is alert and oriented to person, place, and time.  No focal deficits   Filed Vitals:   07/18/13 0835  BP: 116/76  Pulse: 72  Temp: 97.9 F (36.6 C)  TempSrc: Oral  Height: 5\' 6"  (1.676 m)  Weight: 275 lb 11.2 oz (125.057 kg)  SpO2: 97%  Blood glucose: 328 mg/dL     Assessment:     This is 37 yo F w/ currently uncontrolled T2DM with minor wound to her heal w/o any signs of infxn, being managed conservatively as an outpatient.   For full assessment please see problem oriented charting.      Plan:     Please see problem oriented charting for plan.

## 2013-07-18 NOTE — Progress Notes (Signed)
  Subjective:    Patient ID: Renee Ho, female    DOB: 05/25/1976, 37 y.o.   MRN: [id number]  HPI  Renee Ho is a 37 y.o. woman with diabetes, HTN, and OSA who presents 3 days after stepping on the head of a nail on her deck.  The edge of the head of the nail avulsed an area of skin on her left heel.  She has been cleaning it with soap and water.  She denies any erythema, swelling, redness, or drainage.  There is pain around the lesion.  Her blood sugar this AM is just over 200.  She presents today to have the lesion looked at to assure it is not infected given that she is a diabetic.  She is w/o other complaints at this time.  Review of Systems  Constitutional: Negative for fever and chills.  Musculoskeletal: Positive for gait problem. Negative for arthralgias.  Skin: Positive for wound. Negative for color change and rash.      Objective:   Physical Exam  Nursing note and vitals reviewed. Constitutional: She is oriented to person, place, and time. She appears well-developed and well-nourished. No distress.  HENT:  Head: Normocephalic and atraumatic.  Eyes: Conjunctivae are normal. Right eye exhibits no discharge. Left eye exhibits no discharge. No scleral icterus.  Musculoskeletal: Normal range of motion. She exhibits tenderness. She exhibits no edema.  Neurological: She is alert and oriented to person, place, and time. She exhibits normal muscle tone.  Skin: Skin is warm and dry. No rash noted. She is not diaphoretic. No erythema.  Approximately 1 inch avulsion with overlying scab on the left heel.  There is no erythema, drainage, swelling, or palpable foreign body.  Psychiatric: She has a normal mood and affect. Her behavior is normal. Judgment and thought content normal.      Assessment & Plan:   Avulsion injury of the skin 3 days ago.  The lesion does not look infected and seems to be healing well.  1) Left heel lesion:  Clinically there is no need for prophylactic  antibiotics at this time.  She was given a handout with things to watch for that would suggest the lesion is infected.  If she experiences any of those signs and symptoms she was asked to call the clinic to be re-evaluated.  In the interim, she will receive a Tdap today prior to leaving.  2) Preventative health care: She was also willing to receive the Pneumovax today and this will be administered.

## 2013-07-18 NOTE — Patient Instructions (Signed)
It was a pleasure seeing you in clinic today!  We do not believe your wound is infected. We are giving you Tdap and Pneumococcal vaccinations today. If you experience increased pain, redness, swelling, pus or warmth to the area, or develop a fever, please seek care with our clinic as these are signs of infection.

## 2013-08-27 ENCOUNTER — Other Ambulatory Visit: Payer: Self-pay | Admitting: Internal Medicine

## 2013-09-05 ENCOUNTER — Ambulatory Visit (INDEPENDENT_AMBULATORY_CARE_PROVIDER_SITE_OTHER): Payer: 59 | Admitting: Family Medicine

## 2013-09-05 ENCOUNTER — Other Ambulatory Visit: Payer: Self-pay | Admitting: Internal Medicine

## 2013-09-05 VITALS — BP 122/82 | Wt 271.0 lb

## 2013-09-05 DIAGNOSIS — E119 Type 2 diabetes mellitus without complications: Secondary | ICD-10-CM

## 2013-09-05 NOTE — Progress Notes (Signed)
Patient presents today for 3 month diabetes follow-up as part of the employer-sponsored Link to Wellness program. Current diabetes regimen includes metformin and Januvia. Patient also continues on daily ASA, ACEi. She is not on statin at this time. Most recent MD follow-up was December 2013. Patient will need to schedule a yearly exam for january and will do so soon. No med changes or major health changes at this time.   Diabetes Assessment: Type of Diabetes: Type 2; Sees Diabetes provider 3 times per year; checks feet daily; MD managing Diabetes Kristie Cowman, PCP; checks blood glucose Never; does not use glucometer; takes an aspirin a day; takes medications as prescribed; A1c 9.1 (08/26/13) Other Diabetes History: Current med regimen includes Januvia 100 mg daily and Metformin 1000 mg twice daily. Patient has struggled with medication compliance in the past but is working to improve this. I have continued to encouraged patient to improve compliance for better control of glucose. Patient did not bring meter today and is not testing at this time, unless she is symptomatic. Patient denies symptoms of hypoglycemia. Patient denies signs and symptoms of neuropathy in feet. A1c today was 9.1 via point-of-care testing in pharmacy (decreased by 1.0).   Lifestyle Factors: Diet - Patient is attempting to maintain healthy dietary changes and has worked to further improve diet since last visit. She continues limiting pasta to twice per week and is still eating red pototoes but watching portion sizes. She is also making better snacks choices and reports having brocolli, celery, and carrots in her house now to snack on. Few snack cakes, very little ice cream and candy bars lately. At this time she would like to focus on maintaining healthy diet through the holidays and also increasing water consumption. She currently drings ONLY diet drinks and will attempt to replace at least one diet pepsi with a bottle of water.   Exercise - Patient is walking approximately 4 days per week for 30 minutes each time. She babysits her grandchild on these days and enjoys pushing him in the stroller.   Assessment: Pt presents today for DM follow-up. She remains unchanged with regard to med compliance and exercise. She has continued to make improvements to diet and seems comitted to maintaining these changes. A1c remains elevated above goal and is 9.1 at this visit. We have continued to set goals. Patient will follow-up with MD today and return for LTW follow-up in 12 weeks.  Plan: 1) Continue to maintain healthy diet, especially through the holidays 2) Attempt to increase water and decrease diet drinks 3) Continue exercising at least 3 days per week 4) Follow-up in 3 months on Tuesday Feb 24th @ 11:00 am

## 2013-10-22 NOTE — Progress Notes (Signed)
Patient ID: Renee Ho L Ho, female   DOB: 09/30/1976, 38 y.o.   MRN: [id number] ATTENDING PHYSICIAN NOTE: I have reviewed the chart and agree with the plan as detailed above. Denny LevySara Montreal Steidle MD Pager [phone]

## 2013-10-24 ENCOUNTER — Other Ambulatory Visit: Payer: Self-pay | Admitting: *Deleted

## 2013-10-24 DIAGNOSIS — E119 Type 2 diabetes mellitus without complications: Secondary | ICD-10-CM

## 2013-10-24 NOTE — Telephone Encounter (Signed)
Pt has 1 more refill on previous rx but would like to put refill at this time. Thanks

## 2013-10-25 MED ORDER — SITAGLIPTIN PHOSPHATE 100 MG PO TABS: 100.0000 mg | ORAL_TABLET | Freq: Every day | ORAL | Status: DC

## 2013-12-19 ENCOUNTER — Ambulatory Visit (INDEPENDENT_AMBULATORY_CARE_PROVIDER_SITE_OTHER): Payer: Self-pay | Admitting: Family Medicine

## 2013-12-19 ENCOUNTER — Other Ambulatory Visit: Payer: Self-pay | Admitting: Internal Medicine

## 2013-12-19 VITALS — BP 126/84 | Wt 269.0 lb

## 2013-12-19 DIAGNOSIS — E119 Type 2 diabetes mellitus without complications: Secondary | ICD-10-CM

## 2013-12-19 NOTE — Progress Notes (Signed)
Patient presents today for 3 month diabetes follow-up as part of the employer-sponsored Link to Wellness program. Current diabetes regimen includes Januvia and Metformin. Patient also continues on daily ASA and ACEi. Patient cannot recall her most recent MD follow-up, but is likely due for one at this time. No med changes or major health changes at this time.   Diabetes Assessment: Diabetes: Type of Diabetes: Type 2; checks feet daily; MD managing Diabetes Kristie CowmanKaren Schooler, PCP; checks blood glucose Never; does not use glucometer; takes an aspirin a day; takes medications as prescribed; Sees Diabetes provider 1 time a year; A1c 9.1 (prev 9.1) Other Diabetes History: Current med regimen includes Januvia 100 mg daily and Metformin 1000 mg twice daily. Patient has struggled with medication compliance in the past but is working to improve this. I have continued to encouraged patient to improve compliance for better control of glucose. I have asked her to set an alarm, especially as a reminder of the second Metformin dose. Patient did not bring meter today and is not testing at this time, unless she is symptomatic. When she first started her diet, she was testing daily and did so for ~2 weeks but has not tested since this time. Patient denies symptoms of hypoglycemia. Patient denies signs and symptoms of neuropathy in feet. A1c today was 9.1 via point-of-care testing in pharmacy (no change). Patient had eye exam in Feb 2015 and was negative for retinopathy.  Lifestyle Factors: Diet - Patient has made significant changes to diet since the new year and is now participating in weigh to wellness. She has improved her snack choices and is now choosing fresh veg (carrot sticks, brocolli florets, cucumbers, etc), peanuts, nabisco ","thins"," with cheese, etc. She is no longer snacking on snack cakes, cookies, chips, etc. She is also attempting to read labels while grocery shopping. Portions under good control. Patient was  eating some type of pasta side (macaroni, pastaroni, etc) every night with supper. She is now limiting pasta, but still eating egg noodle dishes. I have cautioned her against this type of pasta as well. This is her birthday week and patient admits to eating whatever she wants this week, but agrees to resume dietary changes this Monday.  Exercise - Very little exercise in past month due to weather. Prior to this she was walking dogs daily for ~30 minutes and also taking her grandson for walks at the nearby park. I have encouraged her to resume daily walking given recent improvements in weather and spring nearing.   Assessment: Pt presents today for DM follow-up. She is working to improve med compliance and has made significant changes to diet. Exercise remains much the same. Unfortunately A1c remains elevated and unchanged at 9.1. We have continued to set goals and I am hopeful patient will be motivated to continue dietary changes. Patient will follow-up in 3 months.  Plan: 1) Continue making healthy dieatry choices, great job with changes so far 2) Resume walking daily for 30 minutes 3) Continue to maintain compliance with medication, attmept to get second dose of metformin 4) Follow-up in 3 months on Wednesday June 10th @ 11:00 am

## 2014-01-07 ENCOUNTER — Encounter: Payer: Self-pay | Admitting: Internal Medicine

## 2014-01-16 ENCOUNTER — Telehealth: Payer: Self-pay | Admitting: Dietician

## 2014-01-16 NOTE — Telephone Encounter (Signed)
Call to encourage patient to have A1C checked. It appears she has had some weight loss and is doing better taking medicines. Could consider [facility].

## 2014-02-26 ENCOUNTER — Other Ambulatory Visit: Payer: Self-pay | Admitting: Internal Medicine

## 2014-04-03 ENCOUNTER — Ambulatory Visit (INDEPENDENT_AMBULATORY_CARE_PROVIDER_SITE_OTHER): Payer: Self-pay | Admitting: Family Medicine

## 2014-04-03 ENCOUNTER — Other Ambulatory Visit: Payer: Self-pay | Admitting: Internal Medicine

## 2014-04-03 ENCOUNTER — Encounter: Payer: Self-pay | Admitting: Internal Medicine

## 2014-04-03 VITALS — BP 132/92 | Wt 268.0 lb

## 2014-04-03 DIAGNOSIS — E118 Type 2 diabetes mellitus with unspecified complications: Secondary | ICD-10-CM

## 2014-04-03 NOTE — Telephone Encounter (Signed)
Flag sent to front desk pool  For appt per Dr Rogelia BogaButcher.

## 2014-04-03 NOTE — Telephone Encounter (Signed)
Poorly controlled DM. Last seen 2014. pls sch new PCP 30 days

## 2014-04-05 NOTE — Progress Notes (Signed)
Patient presents today for 3 month diabetes follow-up as part of the employer-sponsored Link to Wellness program. Current diabetes regimen includes Januvia and Metformin. Patient also continues on daily ASA and ACEi. Patient's last MD appt was with IM, Dr. Josem KaufmannKlima in Oct 2014. Patient is due for follow-up with PCP for DM management, but has not scheduled an appt. I have encouraged pt to do so soon. No med changes or major health changes at this time.   Diabetes Assessment: Type of Diabetes: Type 2; checks feet daily; MD managing Diabetes Renee Ho, PCP; checks blood glucose Never; does not use glucometer; takes an aspirin a day Other Diabetes History: Current med regimen includes Januvia 100 mg daily and Metformin 1000 mg twice daily. Patient continues to struggle with medication compliance (specifically second metformin dose) but is working to improve this. I have continued to encouraged patient to improve compliance for better control of glucose. Patient did not bring meter today and is not testing at this time, unless she is symptomatic. She did test last night since she was coming to appt and reading was 178 post prandial. She tested while here at the appt and reading was >200, fasting. Patient denies symptoms of hypoglycemia. Patient denies signs and symptoms of neuropathy in feet. A1c was not done today, but previously in March A1c was 9.1. I am concerned that A1c remains elevated or even increased. I have encouraged her to schedule an appt soon. Patient had eye exam in Feb 2015 and was negative for retinopathy.  Lifestyle Factors: Diet - Patient continues making good snack choices and limitng pasta. She admits that potatoes are her weakness and she eats them multiple times per week in any variety (baked, roasted, fried, mashed, etc). Patient attempts to control diet but continues to have room for improvement.  Exercise - no exercise at this time, due to summer temperatures. I have encouraged her to  exercise indoors when possible, including walking in place during commercial breaks , and making the most of her shopping by walking the aisles multiple times.    Assessment: Pt presents today for DM follow-up. She is working to improve med compliance and has maintained changes to diet. Patient is not exercising at this time. Unfortunately A1c remains elevated and unchanged at 9.1. We have continued to set goals and I am hopeful patient will be motivated to continue dietary changes. Also, of significant importance is that patient should schedule a PCP visit ASAP. I have agreed to follow-up in 3 months. If patient's A1c remains elevated at next visit and/or if she has yet to make an appt, we will begin to see her monthly until under better control..     Plan: 1) Schedule an appt with [place] ASAP to establish care and have diabetes evaluated 2) Exercise as often as possible, take advantage of indoor opportunities. March in place during commercial breaks, etc. 3) Continue to maintain healthy diet, limit potatoes to twice weekly and monitor portion sizes of potatoes 4) Follow-up in 3 months on Wednesday September 23rd @ 9:30 am

## 2014-05-03 ENCOUNTER — Ambulatory Visit (INDEPENDENT_AMBULATORY_CARE_PROVIDER_SITE_OTHER): Payer: 59 | Admitting: Family Medicine

## 2014-05-03 VITALS — BP 132/88 | HR 74 | Temp 98.4°F | Resp 16 | Ht 65.75 in | Wt 270.8 lb

## 2014-05-03 DIAGNOSIS — E119 Type 2 diabetes mellitus without complications: Secondary | ICD-10-CM

## 2014-05-03 DIAGNOSIS — F3289 Other specified depressive episodes: Secondary | ICD-10-CM

## 2014-05-03 DIAGNOSIS — E1165 Type 2 diabetes mellitus with hyperglycemia: Secondary | ICD-10-CM

## 2014-05-03 DIAGNOSIS — E785 Hyperlipidemia, unspecified: Secondary | ICD-10-CM

## 2014-05-03 DIAGNOSIS — I10 Essential (primary) hypertension: Secondary | ICD-10-CM

## 2014-05-03 DIAGNOSIS — F329 Major depressive disorder, single episode, unspecified: Secondary | ICD-10-CM

## 2014-05-03 DIAGNOSIS — F32A Depression, unspecified: Secondary | ICD-10-CM

## 2014-05-03 DIAGNOSIS — E669 Obesity, unspecified: Secondary | ICD-10-CM

## 2014-05-03 LAB — POCT GLYCOSYLATED HEMOGLOBIN (HGB A1C): Hemoglobin A1C: 8.8

## 2014-05-03 LAB — GLUCOSE, POCT (MANUAL RESULT ENTRY): POC Glucose: 224 mg/dl — AB (ref 70–99)

## 2014-05-03 MED ORDER — DULOXETINE HCL 60 MG PO CPEP: ORAL_CAPSULE | ORAL | Status: DC

## 2014-05-03 MED ORDER — INSULIN GLARGINE 100 UNIT/ML ~~LOC~~ SOLN: 8.0000 [IU] | Freq: Every day | SUBCUTANEOUS | Status: DC

## 2014-05-03 NOTE — Patient Instructions (Addendum)
You should receive a call or letter about your lab results within the next week to 10 days.  We will also call you to schedule an appointment with new primary provider here in next few months, but recheck for diabetes in next 6 weeks.  Work on exercise as discussed, watch food choices and drink plenty of water.   Start Lantus 8 units each night - can increase dose by 2 units every 3-4 days until blood sugars remain under 200 (then remain at that dose each night).  Continue metformin 1000mg  twice per day.  Recheck in next 6 weeks with home blood sugar readings. Watch for low blood sugar symptoms as below.   Insulin Glargine injection What is this medicine? INSULIN GLARGINE (IN su lin GLAR geen) is a human-made form of insulin. This drug lowers the amount of sugar in your blood. It is a long-acting insulin that is usually given once a day. This medicine may be used for other purposes; ask your health care provider or pharmacist if you have questions. COMMON BRAND NAME(S): Lantus, Lantus SoloStar, Toujeo SoloStar What should I tell my health care provider before I take this medicine? They need to know if you have any of these conditions: -episodes of hypoglycemia -kidney disease -liver disease -an unusual or allergic reaction to insulin, metacresol, other medicines, foods, dyes, or preservatives -pregnant or trying to get pregnant -breast-feeding How should I use this medicine? This medicine is for injection under the skin. Use this medicine at the same time each day. Use exactly as directed. This insulin should never be mixed in the same syringe with other insulins before injection. Do not vigorously shake before use. You will be taught how to use this medicine and how to adjust doses for activities and illness. Do not use more insulin than prescribed. Always check the appearance of your insulin before using it. This medicine should be clear and colorless like water. Do not use it if it is  cloudy, thickened, colored, or has solid particles in it. It is important that you put your used needles and syringes in a special sharps container. Do not put them in a trash can. If you do not have a sharps container, call your pharmacist or healthcare provider to get one. Talk to your pediatrician regarding the use of this medicine in children. Special care may be needed. Overdosage: If you think you have taken too much of this medicine contact a poison control center or emergency room at once. NOTE: This medicine is only for you. Do not share this medicine with others. What if I miss a dose? It is important not to miss a dose. Your health care professional or doctor should discuss a plan for missed doses with you. If you do miss a dose, follow their plan. Do not take double doses. What may interact with this medicine? -other medicines for diabetes Many medications may cause an increase or decrease in blood sugar, these include: -alcohol containing beverages -aspirin and aspirin-like drugs -chloramphenicol -chromium -diuretics -female hormones, like estrogens or progestins and birth control pills -heart medicines -isoniazid -female hormones or anabolic steroids -medicines for weight loss -medicines for allergies, asthma, cold, or cough -medicines for mental problems -medicines called MAO Inhibitors like Nardil, Parnate, Marplan, Eldepryl -niacin -NSAIDs, medicines for pain and inflammation, like ibuprofen or naproxen -pentamidine -phenytoin -probenecid -quinolone antibiotics like ciprofloxacin, levofloxacin, ofloxacin -some herbal dietary supplements -steroid medicines like prednisone or cortisone -thyroid medicine Some medications can hide the warning symptoms of  low blood sugar. You may need to monitor your blood sugar more closely if you are taking one of these medications. These include: -beta-blockers such as atenolol, metoprolol,  propranolol -clonidine -guanethidine -reserpine This list may not describe all possible interactions. Give your health care provider a list of all the medicines, herbs, non-prescription drugs, or dietary supplements you use. Also tell them if you smoke, drink alcohol, or use illegal drugs. Some items may interact with your medicine. What should I watch for while using this medicine? Visit your health care professional or doctor for regular checks on your progress. A test called the HbA1C (A1C) will be monitored. This is a simple blood test. It measures your blood sugar control over the last 2 to 3 months. You will receive this test every 3 to 6 months. Learn how to check your blood sugar. Learn the symptoms of low and high blood sugar and how to manage them. Always carry a quick-source of sugar with you in case you have symptoms of low blood sugar. Examples include hard sugar candy or glucose tablets. Make sure others know that you can choke if you eat or drink when you develop serious symptoms of low blood sugar, such as seizures or unconsciousness. They must get medical help at once. Tell your doctor or health care professional if you have high blood sugar. You might need to change the dose of your medicine. If you are sick or exercising more than usual, you might need to change the dose of your medicine. Do not skip meals. Ask your doctor or health care professional if you should avoid alcohol. Many nonprescription cough and cold products contain sugar or alcohol. These can affect blood sugar. Make sure that you have the right kind of syringe for the type of insulin you use. Try not to change the brand and type of insulin or syringe unless your health care professional or doctor tells you to. Switching insulin brand or type can cause dangerously high or low blood sugar. Always keep an extra supply of insulin, syringes, and needles on hand. Use a syringe one time only. Throw away syringe and needle in  a closed container to prevent accidental needle sticks. Insulin pens and cartridges should never be shared. Even if the needle is changed, sharing may result in passing of viruses like hepatitis or HIV. Wear a medical ID bracelet or chain, and carry a card that describes your disease and details of your medicine and dosage times. What side effects may I notice from receiving this medicine? Side effects that you should report to your health care professional or doctor as soon as possible: -allergic reactions like skin rash, itching or hives, swelling of the face, lips, or tongue -breathing problems -signs and symptoms of high blood sugar such as dizziness, dry mouth, dry skin, fruity breath, nausea, stomach pain, increased hunger or thirst, increased urination -signs and symptoms of low blood sugar such as feeling anxious, confusion, dizziness, increased hunger, unusually weak or tired, sweating, shakiness, cold, irritable, headache, blurred vision, fast heartbeat, loss of consciousness Side effects that usually do not require medical attention (report to your health care professional or doctor if they continue or are bothersome): -increase or decrease in fatty tissue under the skin due to overuse of a particular injection site -itching, burning, swelling, or rash at site where injected This list may not describe all possible side effects. Call your doctor for medical advice about side effects. You may report side effects to FDA at  1-800-FDA-1088. Where should I keep my medicine? Keep out of the reach of children. Store unopened vials in a refrigerator between 2 and 8 degrees C (36 and 46 degrees F). Do not freeze or use if the insulin has been frozen. Opened vials (vials currently in use) may be stored in the refrigerator or at room temperature, at approximately 25 degrees C (77 degrees F) or cooler. Keeping your insulin at room temperature decreases the amount of pain during injection. Once opened,  your insulin can be used for 28 days. After 28 days, the vial should be thrown away. Store unopened pen-injector cartridges in a refrigerator between 2 and 8 degrees C (36 and 46 degrees F.) Do not freeze or use if the insulin has been frozen. Insulin cartridges inserted into the [employer] system should be kept at room temperature, approximately 25 degrees C (77 degrees F) or cooler. Do not store in the refrigerator. Once inserted into the [employer] system, the insulin can be used for 28 days. After 28 days, the cartridge should be thrown away. Protect from light and excessive heat. Throw away any unused medicine after the expiration date or after the specified time for room temperature storage has passed. NOTE: This sheet is a summary. It may not cover all possible information. If you have questions about this medicine, talk to your doctor, pharmacist, or health care provider.  2015, [employer]vier/[employer] Standard. (2013-12-06 10:13:56)   Low Blood Sugar Low blood sugar (hypoglycemia) means that the level of sugar in your blood is lower than it should be. Signs of low blood sugar include:  Getting sweaty.  Feeling hungry.  Feeling dizzy or weak.  Feeling sleepier than normal.  Feeling nervous.  Headaches.  Having a fast heartbeat. Low blood sugar can happen fast and can be an emergency. Your doctor can do tests to check your blood sugar level. You can have low blood sugar and not have diabetes. HOME CARE  Check your blood sugar as told by your doctor. If it is less than 70 mg/dl or as told by your doctor, take 1 of the following:  3 to 4 glucose tablets.   cup clear juice.   cup soda pop, not diet.  1 cup milk.  5 to 6 hard candies.  Recheck blood sugar after 15 minutes. Repeat until it is at the right level.  Eat a snack if it is more than 1 hour until the next meal.  Only take medicine as told by your doctor.  Do not skip meals. Eat on time.  Do not drink alcohol except with  meals.  Check your blood glucose before driving.  Check your blood glucose before and after exercise.  Always carry treatment with you, such as glucose pills.  Always wear a medical alert bracelet if you have diabetes. GET HELP RIGHT AWAY IF:   Your blood glucose goes below 70 mg/dl or as told by your doctor, and you:  Are confused.  Are not able to swallow.  Pass out (faint).  You cannot treat yourself. You may need someone to help you.  You have low blood sugar problems often.  You have problems from your medicines.  You are not feeling better after 3 to 4 days.  You have vision changes. MAKE SURE YOU:   Understand these instructions.  Will watch this condition.  Will get help right away if you are not doing well or get worse. Document Released: 12/22/2009 Document Revised: 12/20/2011 Document Reviewed: 12/22/2009 Exit[employer] Patient Information 2015  [employer]. This information is not intended to replace advice given to you by your health care provider. Make sure you discuss any questions you have with your health care provider.

## 2014-05-03 NOTE — Progress Notes (Signed)
Subjective:    Patient ID: Renee Ho, female    DOB: 11/11/1975, 38 y.o.   MRN: [id number]  HPI Renee Ho is a 38 y.o. female  New patient to our office. Seen 04/05/14 at [facility] as part of [employer] form Employer. Few of these visits there since last year.  History of multiple medical problems below, including depression, DM2, HTN. Last seen by primary provider at [facility] in August 2014. Wants to choose new clinic instead of that office. Only refill ne  DM2- per notes, last A1c in March of this year. - 9.1. On metformin 1gram BID, with hx of missed doses and Januvia 100mg  QD. She also takes ASA QD and on lisinopril 5mg  qd. Misses 1-2 of the evening dose. Home blood sugars in the 200's. Walks once per day for 15-30 minutes. No other new exercise. Weight 270 today, 268 last OV at [facility]. Had been up to 296 a year ago. Diet Dr. Reino Kent, water.  Fast food - rare. Cooks at home usually.   Depression - takes Cymbalta. Out of this for 3 weeks. Ran out of medication. Next appt that was scheduled with outpatient clinic in few weeks, and would not allow refill without OV.  No hx of SI, no hospitalized.started with depression 10 years ago - fiancee committed suicide. Had been followed by Dr. Evelene Croon - on meds, then as things improved - tried off meds for 2 years - depression returned - back on Cymbalta for past 3-4 years. Sx's controlled on Cymbalta - 60mg  qd. No side effects. No recent counseling/therapy. Walking for 15-30 minutes per day.  Works as Runner, broadcasting/film/video asst. At inpatient rehab,   HTN - takes lisinopril and HCTZ., no new side effects or recent dizziness.     Patient Active Problem List   Diagnosis Date Noted  . Injury of foot, left 07/18/2013  . Dysuria 05/22/2013  . Preventive measure 05/22/2013  . HTN (hypertension) 09/18/2012  . Morbid obesity 09/18/2012  . S/P hysterectomy 06/20/2012  . H/O colonoscopy with polypectomy 06/20/2012  . Thrush,  oral 06/19/2012  . Diverticulitis 06/13/2012  . Depression 12/30/2011  . Plantar fasciitis 04/22/2011  . HYPERTRIGLYCERIDEMIA 02/21/2008  . DIABETES MELLITUS, TYPE II 02/24/2007  . POLYCYSTIC OVARIAN DISEASE 02/24/2007  . HYPERTENSION 02/24/2007  . SLEEP APNEA on CPAP 02/24/2007   Past Medical History  Diagnosis Date  . Diabetes mellitus   . Diverticula of intestine   . High cholesterol   . Polycystic ovarian syndrome   . Hypertension   . Sleep apnea    Past Surgical History  Procedure Laterality Date  . Abdominal hysterectomy    . Ovarian cyst removal    . Knee arthroscopy  07/11/2012    Procedure: ARTHROSCOPY KNEE;  Surgeon: Cammy Copa, MD;  Location: [facility] OR;  Service: Orthopedics;  Laterality: Right;  Right knee arthroscopy with debridement   Allergies  Allergen Reactions  . Metoclopramide Hcl     REACTION: Psychosis  . Morphine Other (See Comments)    migraine  . Toradol [Ketorolac Tromethamine] Rash   Prior to Admission medications   Medication Sig Start Date End Date Taking? Authorizing Provider  aspirin EC 81 MG tablet Take 81 mg by mouth daily.   Yes Historical Provider, MD  DULoxetine (CYMBALTA) 60 MG capsule TAKE 1 CAPSULE BY MOUTH DAILY. 04/03/14  Yes Burns Spain, MD  hydrochlorothiazide (HYDRODIURIL) 25 MG tablet TAKE 1 TABLET BY MOUTH DAILY. 02/26/14  Yes Hector ShadeKaren M Schooler, MD  JANUVIA 100 MG tablet TAKE 1 TABLET BY MOUTH DAILY. 09/05/13  Yes Hector ShadeKaren M Schooler, MD  lisinopril (PRINIVIL,ZESTRIL) 5 MG tablet TAKE 1 TABLET BY MOUTH ONCE DAILY 12/19/13  Yes Hector ShadeKaren M Schooler, MD  metFORMIN (GLUCOPHAGE) 1000 MG tablet TAKE 1 TABLET BY MOUTH 2 TIMES DAILY WITH A MEAL. 12/19/13  Yes Hector ShadeKaren M Schooler, MD   History   Social History  . Marital Status: Single    Spouse Name: N/A    Number of Children: N/A  . Years of Education: N/A   Occupational History  . Not on file.   Social History Main Topics  . Smoking status: Former Smoker -- 0.50 packs/day     Quit date: 01/21/2011  . Smokeless tobacco: Not on file  . Alcohol Use: Yes     Comment: Rarely.  . Drug Use: No  . Sexual Activity: Not on file   Other Topics Concern  . Not on file   Social History Narrative  . No narrative on file       Review of Systems  Constitutional: Negative for fatigue and unexpected weight change.  Respiratory: Negative for chest tightness and shortness of breath.   Cardiovascular: Negative for chest pain, palpitations and leg swelling.  Gastrointestinal: Negative for abdominal pain and blood in stool.  Neurological: Negative for dizziness, syncope, light-headedness and headaches.       Objective:   Physical Exam  Vitals reviewed. Constitutional: She is oriented to person, place, and time. She appears well-developed and well-nourished. No distress.  Obese/overweight.   HENT:  Head: Normocephalic and atraumatic.  Mouth/Throat: Oropharynx is clear and moist.  Eyes: Conjunctivae and EOM are normal. Pupils are equal, round, and reactive to light.  Neck: Normal range of motion. No thyromegaly present.  Cardiovascular: Normal rate, regular rhythm, normal heart sounds and intact distal pulses.   No murmur heard. Pulmonary/Chest: Effort normal. She has no wheezes. She has no rales.  Abdominal: Soft. Normal appearance. She exhibits no abdominal bruit and no pulsatile midline mass. There is no tenderness.  Neurological: She is alert and oriented to person, place, and time.  Microfilament testing WNL bilat feet  Skin: Skin is warm and dry. She is not diaphoretic.  Psychiatric: She has a normal mood and affect. Her behavior is normal.   Filed Vitals:   05/03/14 1104  BP: 132/88  Pulse: 74  Temp: 98.4 F (36.9 C)  TempSrc: Oral  Resp: 16  Height: 5' 5.75" (1.67 m)  Weight: 270 lb 12.8 oz (122.834 kg)  SpO2: 98%    Results for orders placed in visit on 05/03/14  GLUCOSE, POCT (MANUAL RESULT ENTRY)      Result Value Ref Range   POC Glucose 224  (*) 70 - 99 mg/dl  POCT GLYCOSYLATED HEMOGLOBIN (HGB A1C)      Result Value Ref Range   Hemoglobin A1C 8.8        Assessment & Plan:   Renee Ho is a 38 y.o. female Type 2 diabetes mellitus with hyperglycemia - Plan: POCT glucose (manual entry), POCT glycosylated hemoglobin (Hb A1C), COMPLETE METABOLIC PANEL WITH GFR, Lipid panel, Microalbumin, urine, insulin glargine (LANTUS) 100 UNIT/ML injection  - uncontrolled. Option of another oral agent or go ahead and start long acting insulin. Will start lantus - can start at 8units QHS, then increase by 2 every 3-4 days as below until remaining under 200. Cont metformin and januvia at same doses. recheck with home blood  sugar readings in 6 weeks. Hypoglycemic precautions with insulin discussed.   Essential hypertension - Plan: POCT glucose (manual entry), POCT glycosylated hemoglobin (Hb A1C), COMPLETE METABOLIC PANEL WITH GFR, Lipid panel, Microalbumin, urine  - stable. No med changes. Cont asa and ACE-I with underlying DM2.  Cont hctz 25mg  qd.   Depression - Plan: POCT glucose (manual entry), POCT glycosylated hemoglobin (Hb A1C), COMPLETE METABOLIC PANEL WITH GFR, Lipid panel, Microalbumin, urine, DULoxetine (CYMBALTA) 60 MG capsule  - some increased crying spells/depression sx's off Cymbalta. No SI. Restart Cymbalta at 60mg  QD, consider counseling, and recommended increased exercise for this as well as wt loss and DM/HTN.   Obesity, unspecified  - exercise and healthy food choices discussed. Commended on prior wt loss and encouraged continued efforts.   Will schedule appt with new PCP at [facility] - myself or other provider accepting new pt's.   Meds ordered this encounter  Medications  . DULoxetine (CYMBALTA) 60 MG capsule    Sig: TAKE 1 CAPSULE BY MOUTH DAILY.    Dispense:  90 capsule    Refill:  1  . insulin glargine (LANTUS) 100 UNIT/ML injection    Sig: Inject 0.08 mLs (8 Units total) into the skin at bedtime.    Dispense:  10 mL     Refill:  3   Patient Instructions  You should receive a call or letter about your lab results within the next week to 10 days.  We will also call you to schedule an appointment with new primary provider here in next few months, but recheck for diabetes in next 6 weeks.  Work on exercise as discussed, watch food choices and drink plenty of water.   Start Lantus 8 units each night - can increase dose by 2 units every 3-4 days until blood sugars remain under 200 (then remain at that dose each night).  Continue metformin 1000mg  twice per day.  Recheck in next 6 weeks with home blood sugar readings. Watch for low blood sugar symptoms as below.   Insulin Glargine injection What is this medicine? INSULIN GLARGINE (IN su lin GLAR geen) is a human-made form of insulin. This drug lowers the amount of sugar in your blood. It is a long-acting insulin that is usually given once a day. This medicine may be used for other purposes; ask your health care provider or pharmacist if you have questions. COMMON BRAND NAME(S): Lantus, Lantus SoloStar, Toujeo SoloStar What should I tell my health care provider before I take this medicine? They need to know if you have any of these conditions: -episodes of hypoglycemia -kidney disease -liver disease -an unusual or allergic reaction to insulin, metacresol, other medicines, foods, dyes, or preservatives -pregnant or trying to get pregnant -breast-feeding How should I use this medicine? This medicine is for injection under the skin. Use this medicine at the same time each day. Use exactly as directed. This insulin should never be mixed in the same syringe with other insulins before injection. Do not vigorously shake before use. You will be taught how to use this medicine and how to adjust doses for activities and illness. Do not use more insulin than prescribed. Always check the appearance of your insulin before using it. This medicine should be clear and colorless  like water. Do not use it if it is cloudy, thickened, colored, or has solid particles in it. It is important that you put your used needles and syringes in a special sharps container. Do not put them in a  trash can. If you do not have a sharps container, call your pharmacist or healthcare provider to get one. Talk to your pediatrician regarding the use of this medicine in children. Special care may be needed. Overdosage: If you think you have taken too much of this medicine contact a poison control center or emergency room at once. NOTE: This medicine is only for you. Do not share this medicine with others. What if I miss a dose? It is important not to miss a dose. Your health care professional or doctor should discuss a plan for missed doses with you. If you do miss a dose, follow their plan. Do not take double doses. What may interact with this medicine? -other medicines for diabetes Many medications may cause an increase or decrease in blood sugar, these include: -alcohol containing beverages -aspirin and aspirin-like drugs -chloramphenicol -chromium -diuretics -female hormones, like estrogens or progestins and birth control pills -heart medicines -isoniazid -female hormones or anabolic steroids -medicines for weight loss -medicines for allergies, asthma, cold, or cough -medicines for mental problems -medicines called MAO Inhibitors like Nardil, Parnate, Marplan, Eldepryl -niacin -NSAIDs, medicines for pain and inflammation, like ibuprofen or naproxen -pentamidine -phenytoin -probenecid -quinolone antibiotics like ciprofloxacin, levofloxacin, ofloxacin -some herbal dietary supplements -steroid medicines like prednisone or cortisone -thyroid medicine Some medications can hide the warning symptoms of low blood sugar. You may need to monitor your blood sugar more closely if you are taking one of these medications. These include: -beta-blockers such as atenolol, metoprolol,  propranolol -clonidine -guanethidine -reserpine This list may not describe all possible interactions. Give your health care provider a list of all the medicines, herbs, non-prescription drugs, or dietary supplements you use. Also tell them if you smoke, drink alcohol, or use illegal drugs. Some items may interact with your medicine. What should I watch for while using this medicine? Visit your health care professional or doctor for regular checks on your progress. A test called the HbA1C (A1C) will be monitored. This is a simple blood test. It measures your blood sugar control over the last 2 to 3 months. You will receive this test every 3 to 6 months. Learn how to check your blood sugar. Learn the symptoms of low and high blood sugar and how to manage them. Always carry a quick-source of sugar with you in case you have symptoms of low blood sugar. Examples include hard sugar candy or glucose tablets. Make sure others know that you can choke if you eat or drink when you develop serious symptoms of low blood sugar, such as seizures or unconsciousness. They must get medical help at once. Tell your doctor or health care professional if you have high blood sugar. You might need to change the dose of your medicine. If you are sick or exercising more than usual, you might need to change the dose of your medicine. Do not skip meals. Ask your doctor or health care professional if you should avoid alcohol. Many nonprescription cough and cold products contain sugar or alcohol. These can affect blood sugar. Make sure that you have the right kind of syringe for the type of insulin you use. Try not to change the brand and type of insulin or syringe unless your health care professional or doctor tells you to. Switching insulin brand or type can cause dangerously high or low blood sugar. Always keep an extra supply of insulin, syringes, and needles on hand. Use a syringe one time only. Throw away syringe and needle in  a  closed container to prevent accidental needle sticks. Insulin pens and cartridges should never be shared. Even if the needle is changed, sharing may result in passing of viruses like hepatitis or HIV. Wear a medical ID bracelet or chain, and carry a card that describes your disease and details of your medicine and dosage times. What side effects may I notice from receiving this medicine? Side effects that you should report to your health care professional or doctor as soon as possible: -allergic reactions like skin rash, itching or hives, swelling of the face, lips, or tongue -breathing problems -signs and symptoms of high blood sugar such as dizziness, dry mouth, dry skin, fruity breath, nausea, stomach pain, increased hunger or thirst, increased urination -signs and symptoms of low blood sugar such as feeling anxious, confusion, dizziness, increased hunger, unusually weak or tired, sweating, shakiness, cold, irritable, headache, blurred vision, fast heartbeat, loss of consciousness Side effects that usually do not require medical attention (report to your health care professional or doctor if they continue or are bothersome): -increase or decrease in fatty tissue under the skin due to overuse of a particular injection site -itching, burning, swelling, or rash at site where injected This list may not describe all possible side effects. Call your doctor for medical advice about side effects. You may report side effects to FDA at 1-800-FDA-1088. Where should I keep my medicine? Keep out of the reach of children. Store unopened vials in a refrigerator between 2 and 8 degrees C (36 and 46 degrees F). Do not freeze or use if the insulin has been frozen. Opened vials (vials currently in use) may be stored in the refrigerator or at room temperature, at approximately 25 degrees C (77 degrees F) or cooler. Keeping your insulin at room temperature decreases the amount of pain during injection. Once opened,  your insulin can be used for 28 days. After 28 days, the vial should be thrown away. Store unopened pen-injector cartridges in a refrigerator between 2 and 8 degrees C (36 and 46 degrees F.) Do not freeze or use if the insulin has been frozen. Insulin cartridges inserted into the [employer] system should be kept at room temperature, approximately 25 degrees C (77 degrees F) or cooler. Do not store in the refrigerator. Once inserted into the [employer] system, the insulin can be used for 28 days. After 28 days, the cartridge should be thrown away. Protect from light and excessive heat. Throw away any unused medicine after the expiration date or after the specified time for room temperature storage has passed. NOTE: This sheet is a summary. It may not cover all possible information. If you have questions about this medicine, talk to your doctor, pharmacist, or health care provider.  2015, Elsevier/Gold Standard. (2013-12-06 10:13:56)   Low Blood Sugar Low blood sugar (hypoglycemia) means that the level of sugar in your blood is lower than it should be. Signs of low blood sugar include:  Getting sweaty.  Feeling hungry.  Feeling dizzy or weak.  Feeling sleepier than normal.  Feeling nervous.  Headaches.  Having a fast heartbeat. Low blood sugar can happen fast and can be an emergency. Your doctor can do tests to check your blood sugar level. You can have low blood sugar and not have diabetes. HOME CARE  Check your blood sugar as told by your doctor. If it is less than 70 mg/dl or as told by your doctor, take 1 of the following:  3 to 4 glucose tablets.   cup clear  juice.   cup soda pop, not diet.  1 cup milk.  5 to 6 hard candies.  Recheck blood sugar after 15 minutes. Repeat until it is at the right level.  Eat a snack if it is more than 1 hour until the next meal.  Only take medicine as told by your doctor.  Do not skip meals. Eat on time.  Do not drink alcohol except with  meals.  Check your blood glucose before driving.  Check your blood glucose before and after exercise.  Always carry treatment with you, such as glucose pills.  Always wear a medical alert bracelet if you have diabetes. GET HELP RIGHT AWAY IF:   Your blood glucose goes below 70 mg/dl or as told by your doctor, and you:  Are confused.  Are not able to swallow.  Pass out (faint).  You cannot treat yourself. You may need someone to help you.  You have low blood sugar problems often.  You have problems from your medicines.  You are not feeling better after 3 to 4 days.  You have vision changes. MAKE SURE YOU:   Understand these instructions.  Will watch this condition.  Will get help right away if you are not doing well or get worse. Document Released: 12/22/2009 Document Revised: 12/20/2011 Document Reviewed: 12/22/2009 [facility] Patient Information 2015 [place], [place]. This information is not intended to replace advice given to you by your health care provider. Make sure you discuss any questions you have with your health care provider.

## 2014-05-04 LAB — MICROALBUMIN, URINE: Microalb, Ur: 0.51 mg/dL (ref 0.00–1.89)

## 2014-05-04 LAB — LIPID PANEL
CHOL/HDL RATIO: 6.6 ratio
CHOLESTEROL: 219 mg/dL — AB (ref 0–200)
HDL: 33 mg/dL — AB (ref >39)
LDL Cholesterol: 115 mg/dL — ABNORMAL HIGH (ref 0–99)
Triglycerides: 357 mg/dL — ABNORMAL HIGH (ref <150)
VLDL: 71 mg/dL — AB (ref 0–40)

## 2014-05-04 LAB — COMPLETE METABOLIC PANEL WITH GFR
ALK PHOS: 47 U/L (ref 39–117)
ALT: 17 U/L (ref 0–35)
AST: 20 U/L (ref 0–37)
Albumin: 4.2 g/dL (ref 3.5–5.2)
BILIRUBIN TOTAL: 0.8 mg/dL (ref 0.2–1.2)
BUN: 14 mg/dL (ref 6–23)
CO2: 21 mEq/L (ref 19–32)
Calcium: 9.1 mg/dL (ref 8.4–10.5)
Chloride: 102 mEq/L (ref 96–112)
Creat: 0.7 mg/dL (ref 0.50–1.10)
GFR, Est African American: >89 mL/min
Glucose, Bld: 243 mg/dL — ABNORMAL HIGH (ref 70–99)
Potassium: 4.1 mEq/L (ref 3.5–5.3)
SODIUM: 136 meq/L (ref 135–145)
Total Protein: 7.2 g/dL (ref 6.0–8.3)

## 2014-05-06 NOTE — Progress Notes (Signed)
LMVM for pt to CB to schedule a 6 wk f/up

## 2014-05-14 MED ORDER — ATORVASTATIN CALCIUM 10 MG PO TABS: 10.0000 mg | ORAL_TABLET | Freq: Every day | ORAL | Status: DC

## 2014-05-14 NOTE — Addendum Note (Signed)
Addended by: Meredith StaggersGREENE, Kaesyn Johnston R on: 05/14/2014 10:28 PM   Modules accepted: Orders

## 2014-05-14 NOTE — Progress Notes (Signed)
See lab notes. Will start Lipitor 10mg  qd.

## 2014-05-15 ENCOUNTER — Encounter: Payer: Self-pay | Admitting: Family Medicine

## 2014-05-15 NOTE — Progress Notes (Signed)
Patient ID: Renee HastenWanda L Nickles, female   DOB: 08/13/1976, 38 y.o.   MRN: [id number] Reviewed: Agree with our pharmacologist's documentation and management.

## 2014-05-15 NOTE — Progress Notes (Signed)
Patient ID: Renee Ho, female   DOB: 04/24/1976, 38 y.o.   MRN: [id number] Reviewed: Agree with our pharmacologist's documentation and management. 

## 2014-05-21 NOTE — Progress Notes (Signed)
Left a message for patient to return call to schedule an appointment. 

## 2014-05-23 NOTE — Progress Notes (Signed)
Left a message for patient to return call to schedule a six week follow up appointment.

## 2014-07-01 ENCOUNTER — Ambulatory Visit (INDEPENDENT_AMBULATORY_CARE_PROVIDER_SITE_OTHER): Payer: 59 | Admitting: Family Medicine

## 2014-07-01 ENCOUNTER — Encounter: Payer: Self-pay | Admitting: Family Medicine

## 2014-07-01 VITALS — BP 132/75 | HR 92 | Temp 98.3°F | Resp 16 | Ht 66.25 in | Wt 271.6 lb

## 2014-07-01 DIAGNOSIS — E119 Type 2 diabetes mellitus without complications: Secondary | ICD-10-CM

## 2014-07-01 DIAGNOSIS — R55 Syncope and collapse: Secondary | ICD-10-CM

## 2014-07-01 DIAGNOSIS — I10 Essential (primary) hypertension: Secondary | ICD-10-CM

## 2014-07-01 DIAGNOSIS — E785 Hyperlipidemia, unspecified: Secondary | ICD-10-CM

## 2014-07-01 DIAGNOSIS — E162 Hypoglycemia, unspecified: Secondary | ICD-10-CM

## 2014-07-01 LAB — POCT CBC
Granulocyte percent: 60.5 %G (ref 37–80)
HCT, POC: 45.2 % (ref 37.7–47.9)
Hemoglobin: 15.3 g/dL (ref 12.2–16.2)
LYMPH, POC: 2.5 (ref 0.6–3.4)
MCH, POC: 31.2 pg (ref 27–31.2)
MCHC: 33.8 g/dL (ref 31.8–35.4)
MCV: 92.2 fL (ref 80–97)
MID (CBC): 0.7 (ref 0–0.9)
MPV: 9.9 fL (ref 0–99.8)
POC Granulocyte: 5 (ref 2–6.9)
POC LYMPH PERCENT: 30.6 %L (ref 10–50)
POC MID %: 8.9 %M (ref 0–12)
Platelet Count, POC: 170 10*3/uL (ref 142–424)
RBC: 4.91 M/uL (ref 4.04–5.48)
RDW, POC: 13.6 %
WBC: 8.2 10*3/uL (ref 4.6–10.2)

## 2014-07-01 LAB — GLUCOSE, POCT (MANUAL RESULT ENTRY): POC GLUCOSE: 311 mg/dL — AB (ref 70–99)

## 2014-07-01 MED ORDER — JANUVIA 100 MG PO TABS: ORAL_TABLET | ORAL | Status: DC

## 2014-07-01 MED ORDER — ATORVASTATIN CALCIUM 10 MG PO TABS: 10.0000 mg | ORAL_TABLET | Freq: Every day | ORAL | Status: DC

## 2014-07-01 MED ORDER — LISINOPRIL 5 MG PO TABS: ORAL_TABLET | ORAL | Status: DC

## 2014-07-01 MED ORDER — HYDROCHLOROTHIAZIDE 25 MG PO TABS: ORAL_TABLET | ORAL | Status: DC

## 2014-07-01 NOTE — Progress Notes (Addendum)
Subjective:    Patient ID: Renee Ho, female    DOB: 17-Sep-1976, 38 y.o.   MRN: [id number] This chart was scribed for Shade Flood, MD by Julian Hy, ED Scribe. The patient was seen in Room 26. The patient's care was started at 2:24 PM.   07/01/2014  Follow-up and Loss of Consciousness   HPI HPI Comments: Renee Ho is a 38 y.o. female who presents to the [facility] for a follow-up visit.   Pt is here for a follow-up, her last visit with me was July 24.  1.) Diabetes A1C of 8.8, started Lantus q.h.s with plan of increasing slowly until blood sugars remain under 200. Looks like she is up to 30 units of Lantus for approximately one month at this visit. Hypoglycemic precautions were reviewed at that visit and continued on metformin and Januvia at same doses. She was started on Lipitor 10 mg per day for LDL 115, total cholesterol 219 at that visit. Normal LFTs. Highest weight of 296 but down to 207 at last visit and she is 271 today.   2.) Depression  Restarted Symbalta at last visit, as had been out for 3 weeks at that time.  3.) Hypertension Last visit her BP was 132/88. She takes 81 mg q.d, HCTZ 25 mg q.d, and lisinopril 5 mg q.d.  4.) Syncope Thursday and Sunday of last week felt nausea and sweaty at the time but had reported having not eaten either morning.  Pt reports LOC around 10 am during both incidents. She then woke and felt light-headed, nauseated and diaphoresis. She was at her daughter's doctor office during the first incident of syncope. She was given juice and crackers after fainting. Pt reports severe headaches and blurred vision that resolved on its own. No seizure, no slurred speech, no focal weakness in the arms or legs, or chest pain. Pt notes she is currently feeling light-headed.   Review of Systems  Constitutional: Negative for fatigue and unexpected weight change.  Respiratory: Negative for chest tightness and shortness of  breath.   Cardiovascular: Negative for chest pain, palpitations and leg swelling.  Gastrointestinal: Negative for abdominal pain and blood in stool.  Neurological: Positive for syncope and light-headedness. Negative for dizziness and headaches.  All other systems reviewed and are negative.   Patient Active Problem List   Diagnosis Date Noted  . Injury of foot, left 07/18/2013  . Dysuria 05/22/2013  . Preventive measure 05/22/2013  . HTN (hypertension) 09/18/2012  . Morbid obesity 09/18/2012  . S/P hysterectomy 06/20/2012  . H/O colonoscopy with polypectomy 06/20/2012  . Thrush, oral 06/19/2012  . Diverticulitis 06/13/2012  . Depression 12/30/2011  . Plantar fasciitis 04/22/2011  . HYPERTRIGLYCERIDEMIA 02/21/2008  . DIABETES MELLITUS, TYPE II 02/24/2007  . POLYCYSTIC OVARIAN DISEASE 02/24/2007  . HYPERTENSION 02/24/2007  . SLEEP APNEA on CPAP 02/24/2007   Past Medical History  Diagnosis Date  . Diabetes mellitus   . Diverticula of intestine   . High cholesterol   . Polycystic ovarian syndrome   . Hypertension   . Sleep apnea    Past Surgical History  Procedure Laterality Date  . Abdominal hysterectomy    . Ovarian cyst removal    . Knee arthroscopy  07/11/2012    Procedure: ARTHROSCOPY KNEE;  Surgeon: Cammy Copa, MD;  Location: [facility] OR;  Service: Orthopedics;  Laterality: Right;  Right knee arthroscopy with debridement   Allergies  Allergen Reactions  . Metoclopramide Hcl  REACTION: Psychosis  . Morphine Other (See Comments)    migraine  . Toradol [Ketorolac Tromethamine] Rash   Prior to Admission medications   Medication Sig Start Date End Date Taking? Authorizing Provider  aspirin EC 81 MG tablet Take 81 mg by mouth daily.    Historical Provider, MD  atorvastatin (LIPITOR) 10 MG tablet Take 1 tablet (10 mg total) by mouth daily. 05/14/14   Shade Flood, MD  DULoxetine (CYMBALTA) 60 MG capsule TAKE 1 CAPSULE BY MOUTH DAILY. 05/03/14   Shade Flood,  MD  hydrochlorothiazide (HYDRODIURIL) 25 MG tablet TAKE 1 TABLET BY MOUTH DAILY. 02/26/14   Hector Shade, MD  insulin glargine (LANTUS) 100 UNIT/ML injection Inject 0.08 mLs (8 Units total) into the skin at bedtime. 05/03/14   Shade Flood, MD  JANUVIA 100 MG tablet TAKE 1 TABLET BY MOUTH DAILY. 09/05/13   Hector Shade, MD  lisinopril (PRINIVIL,ZESTRIL) 5 MG tablet TAKE 1 TABLET BY MOUTH ONCE DAILY 12/19/13   Hector Shade, MD  metFORMIN (GLUCOPHAGE) 1000 MG tablet TAKE 1 TABLET BY MOUTH 2 TIMES DAILY WITH A MEAL. 12/19/13   Hector Shade, MD   History   Social History  . Marital Status: Single    Spouse Name: N/A    Number of Children: N/A  . Years of Education: N/A   Occupational History  . Not on file.   Social History Main Topics  . Smoking status: Former Smoker -- 0.50 packs/day    Quit date: 01/21/2011  . Smokeless tobacco: Not on file  . Alcohol Use: Yes     Comment: Rarely.  . Drug Use: No  . Sexual Activity: Not on file   Other Topics Concern  . Not on file   Social History Narrative  . No narrative on file     Objective:  Physical Exam  Vitals reviewed. Constitutional: She is oriented to person, place, and time. She appears well-developed and well-nourished.  HENT:  Head: Normocephalic and atraumatic.  Eyes: Conjunctivae and EOM are normal. Pupils are equal, round, and reactive to light. Right eye exhibits normal extraocular motion and no nystagmus. Left eye exhibits normal extraocular motion and no nystagmus.  Neck: Carotid bruit is not present.  Cardiovascular: Normal rate, regular rhythm, normal heart sounds and intact distal pulses.   Pulmonary/Chest: Effort normal and breath sounds normal.  Abdominal: Soft. She exhibits no pulsatile midline mass. There is no tenderness.  Neurological: She is alert and oriented to person, place, and time. She displays a negative Romberg sign.  No pronator drift. No focal weakness. No pronator drift.   Skin:  Skin is warm and dry.  Psychiatric: She has a normal mood and affect. Her behavior is normal.    Filed Vitals:   07/01/14 1403  BP: 132/75  Pulse: 92  Temp: 98.3 F (36.8 C)  TempSrc: Oral  Resp: 16  Height: 5' 6.25" (1.683 m)  Weight: 271 lb 9.6 oz (123.197 kg)  SpO2: 95%   Results for orders placed in visit on 07/01/14  POCT CBC      Result Value Ref Range   WBC 8.2  4.6 - 10.2 K/uL   Lymph, poc 2.5  0.6 - 3.4   POC LYMPH PERCENT 30.6  10 - 50 %L   MID (cbc) 0.7  0 - 0.9   POC MID % 8.9  0 - 12 %M   POC Granulocyte 5.0  2 - 6.9   Granulocyte percent 60.5  37 - 80 %G   RBC 4.91  4.04 - 5.48 M/uL   Hemoglobin 15.3  12.2 - 16.2 g/dL   HCT, POC 16.1  09.6 - 47.9 %   MCV 92.2  80 - 97 fL   MCH, POC 31.2  27 - 31.2 pg   MCHC 33.8  31.8 - 35.4 g/dL   RDW, POC 04.5     Platelet Count, POC 170  142 - 424 K/uL   MPV 9.9  0 - 99.8 fL  GLUCOSE, POCT (MANUAL RESULT ENTRY)      Result Value Ref Range   POC Glucose 311 (*) 70 - 99 mg/dl   EKG: SR, no acute findings.   Assessment & Plan:  2:32 PM- Patient informed of current plan for treatment and evaluation and agrees with plan at this time. Renee Ho is a 38 y.o. female DIABETES MELLITUS, TYPE II, Hypoglycemia  - uncontrolled prior and by reading in office today, but reported syncopal events preceeded by diaphoresis, and associated times of not eating any meal recently suspected to be hypoglycemic events. Discussed need for rehular meals, especially as on insulin, even though it is long acting.  nonfocal neuro exam in office, reassuring EKG and CBC. Will maintain same doses of medications, but to check CBG's tid for next 2-3 weeks then follow up to determine med changes if needed.   -hypoglycemic precautions reviewed, and if recurrence of syncope - rtc to determine further workup if needed.   -cont ASA, ACE-I, statin, diet and activity/exercise.    Other and unspecified hyperlipidemia - Plan: atorvastatin (LIPITOR) 10 MG  tablet  -refilled. Tolerating statin.  CMP pending.   Essential hypertension - Plan: hydrochlorothiazide (HYDRODIURIL) 25 MG tablet, lisinopril (PRINIVIL,ZESTRIL) 5 MG tablet  -stable.  Syncope and collapse - Plan: COMPLETE METABOLIC PANEL WITH GFR, POCT CBC, POCT glucose (manual entry), EKG 12-Lead  -as above. ER/911 precautions discussed.   Meds ordered this encounter  Medications  . hydrochlorothiazide (HYDRODIURIL) 25 MG tablet    Sig: TAKE 1 TABLET BY MOUTH DAILY.    Dispense:  90 tablet    Refill:  1  . atorvastatin (LIPITOR) 10 MG tablet    Sig: Take 1 tablet (10 mg total) by mouth daily.    Dispense:  90 tablet    Refill:  1  . JANUVIA 100 MG tablet    Sig: TAKE 1 TABLET BY MOUTH DAILY.    Dispense:  90 tablet    Refill:  1  . lisinopril (PRINIVIL,ZESTRIL) 5 MG tablet    Sig: TAKE 1 TABLET BY MOUTH ONCE DAILY    Dispense:  90 tablet    Refill:  1   Patient Instructions  You must eat regular meals to lessen risk of hypoglycemia. If these spells recur, we need to look at other medication options. Return to the clinic or go to the nearest emergency room if any of your symptoms worsen or new symptoms occur. Your sugar was very high here.  Check blood sugars 3 times daily, bring this record and recheck in next 2-3 weeks - sooner if remaining over 300, or if passing out occurs again - go to an emergency room.    Hypoglycemia Hypoglycemia occurs when the glucose in your blood is too low. Glucose is a type of sugar that is your body's main energy source. Hormones, such as insulin and glucagon, control the level of glucose in the blood. Insulin lowers blood glucose and glucagon increases blood glucose. Having too  much insulin in your blood stream, or not eating enough food containing sugar, can result in hypoglycemia. Hypoglycemia can happen to people with or without diabetes. It can develop quickly and can be a medical emergency.  CAUSES   Missing or delaying meals.  Not eating  enough carbohydrates at meals.  Taking too much diabetes medicine.  Not timing your oral diabetes medicine or insulin doses with meals, snacks, and exercise.  Nausea and vomiting.  Certain medicines.  Severe illnesses, such as hepatitis, kidney disorders, and certain eating disorders.  Increased activity or exercise without eating something extra or adjusting medicines.  Drinking too much alcohol.  A nerve disorder that affects body functions like your heart rate, blood pressure, and digestion (autonomic neuropathy).  A condition where the stomach muscles do not function properly (gastroparesis). Therefore, medicines and food may not absorb properly.  Rarely, a tumor of the pancreas can produce too much insulin. SYMPTOMS   Hunger.  Sweating (diaphoresis).  Change in body temperature.  Shakiness.  Headache.  Anxiety.  Lightheadedness.  Irritability.  Difficulty concentrating.  Dry mouth.  Tingling or numbness in the hands or feet.  Restless sleep or sleep disturbances.  Altered speech and coordination.  Change in mental status.  Seizures or prolonged convulsions.  Combativeness.  Drowsiness (lethargic).  Weakness.  Increased heart rate or palpitations.  Confusion.  Pale, gray skin color.  Blurred or double vision.  Fainting. DIAGNOSIS  A physical exam and medical history will be performed. Your caregiver may make a diagnosis based on your symptoms. Blood tests and other lab tests may be performed to confirm a diagnosis. Once the diagnosis is made, your caregiver will see if your signs and symptoms go away once your blood glucose is raised.  TREATMENT  Usually, you can easily treat your hypoglycemia when you notice symptoms.  Check your blood glucose. If it is less than 70 mg/dl, take one of the following:   3-4 glucose tablets.    cup juice.    cup regular soda.   1 cup skim milk.   -1 tube of glucose gel.   5-6 hard  candies.   Avoid high-fat drinks or food that may delay a rise in blood glucose levels.  Do not take more than the recommended amount of sugary foods, drinks, gel, or tablets. Doing so will cause your blood glucose to go too high.   Wait 10-15 minutes and recheck your blood glucose. If it is still less than 70 mg/dl or below your target range, repeat treatment.   Eat a snack if it is more than 1 hour until your next meal.  There may be a time when your blood glucose may go so low that you are unable to treat yourself at home when you start to notice symptoms. You may need someone to help you. You may even faint or be unable to swallow. If you cannot treat yourself, someone will need to bring you to the hospital.  HOME CARE INSTRUCTIONS  If you have diabetes, follow your diabetes management plan by:  Taking your medicines as directed.  Following your exercise plan.  Following your meal plan. Do not skip meals. Eat on time.  Testing your blood glucose regularly. Check your blood glucose before and after exercise. If you exercise longer or different than usual, be sure to check blood glucose more frequently.  Wearing your medical alert jewelry that says you have diabetes.  Identify the cause of your hypoglycemia. Then, develop ways to prevent  the recurrence of hypoglycemia.  Do not take a hot bath or shower right after an insulin shot.  Always carry treatment with you. Glucose tablets are the easiest to carry.  If you are going to drink alcohol, drink it only with meals.  Tell friends or family members ways to keep you safe during a seizure. This may include removing hard or sharp objects from the area or turning you on your side.  Maintain a healthy weight. SEEK MEDICAL CARE IF:   You are having problems keeping your blood glucose in your target range.  You are having frequent episodes of hypoglycemia.  You feel you might be having side effects from your medicines.  You  are not sure why your blood glucose is dropping so low.  You notice a change in vision or a new problem with your vision. SEEK IMMEDIATE MEDICAL CARE IF:   Confusion develops.  A change in mental status occurs.  The inability to swallow develops.  Fainting occurs. Document Released: 09/27/2005 Document Revised: 10/02/2013 Document Reviewed: 01/24/2012 [facility] Patient Information 2015 [place], [place]. This information is not intended to replace advice given to you by your health care provider. Make sure you discuss any questions you have with your health care provider.     I personally performed the services described in this documentation, which was scribed in my presence. The recorded information has been reviewed and considered, and addended by me as needed.

## 2014-07-01 NOTE — Patient Instructions (Addendum)
You must eat regular meals to lessen risk of hypoglycemia. If these spells recur, we need to look at other medication options. Return to the clinic or go to the nearest emergency room if any of your symptoms worsen or new symptoms occur. Your sugar was very high here.  Check blood sugars 3 times daily, bring this record and recheck in next 2-3 weeks - sooner if remaining over 300, or if passing out occurs again - go to an emergency room.    Hypoglycemia Hypoglycemia occurs when the glucose in your blood is too low. Glucose is a type of sugar that is your body's main energy source. Hormones, such as insulin and glucagon, control the level of glucose in the blood. Insulin lowers blood glucose and glucagon increases blood glucose. Having too much insulin in your blood stream, or not eating enough food containing sugar, can result in hypoglycemia. Hypoglycemia can happen to people with or without diabetes. It can develop quickly and can be a medical emergency.  CAUSES   Missing or delaying meals.  Not eating enough carbohydrates at meals.  Taking too much diabetes medicine.  Not timing your oral diabetes medicine or insulin doses with meals, snacks, and exercise.  Nausea and vomiting.  Certain medicines.  Severe illnesses, such as hepatitis, kidney disorders, and certain eating disorders.  Increased activity or exercise without eating something extra or adjusting medicines.  Drinking too much alcohol.  A nerve disorder that affects body functions like your heart rate, blood pressure, and digestion (autonomic neuropathy).  A condition where the stomach muscles do not function properly (gastroparesis). Therefore, medicines and food may not absorb properly.  Rarely, a tumor of the pancreas can produce too much insulin. SYMPTOMS   Hunger.  Sweating (diaphoresis).  Change in body temperature.  Shakiness.  Headache.  Anxiety.  Lightheadedness.  Irritability.  Difficulty  concentrating.  Dry mouth.  Tingling or numbness in the hands or feet.  Restless sleep or sleep disturbances.  Altered speech and coordination.  Change in mental status.  Seizures or prolonged convulsions.  Combativeness.  Drowsiness (lethargic).  Weakness.  Increased heart rate or palpitations.  Confusion.  Pale, gray skin color.  Blurred or double vision.  Fainting. DIAGNOSIS  A physical exam and medical history will be performed. Your caregiver may make a diagnosis based on your symptoms. Blood tests and other lab tests may be performed to confirm a diagnosis. Once the diagnosis is made, your caregiver will see if your signs and symptoms go away once your blood glucose is raised.  TREATMENT  Usually, you can easily treat your hypoglycemia when you notice symptoms.  Check your blood glucose. If it is less than 70 mg/dl, take one of the following:   3-4 glucose tablets.    cup juice.    cup regular soda.   1 cup skim milk.   -1 tube of glucose gel.   5-6 hard candies.   Avoid high-fat drinks or food that may delay a rise in blood glucose levels.  Do not take more than the recommended amount of sugary foods, drinks, gel, or tablets. Doing so will cause your blood glucose to go too high.   Wait 10-15 minutes and recheck your blood glucose. If it is still less than 70 mg/dl or below your target range, repeat treatment.   Eat a snack if it is more than 1 hour until your next meal.  There may be a time when your blood glucose may go so low that  you are unable to treat yourself at home when you start to notice symptoms. You may need someone to help you. You may even faint or be unable to swallow. If you cannot treat yourself, someone will need to bring you to the hospital.  HOME CARE INSTRUCTIONS  If you have diabetes, follow your diabetes management plan by:  Taking your medicines as directed.  Following your exercise plan.  Following your meal  plan. Do not skip meals. Eat on time.  Testing your blood glucose regularly. Check your blood glucose before and after exercise. If you exercise longer or different than usual, be sure to check blood glucose more frequently.  Wearing your medical alert jewelry that says you have diabetes.  Identify the cause of your hypoglycemia. Then, develop ways to prevent the recurrence of hypoglycemia.  Do not take a hot bath or shower right after an insulin shot.  Always carry treatment with you. Glucose tablets are the easiest to carry.  If you are going to drink alcohol, drink it only with meals.  Tell friends or family members ways to keep you safe during a seizure. This may include removing hard or sharp objects from the area or turning you on your side.  Maintain a healthy weight. SEEK MEDICAL CARE IF:   You are having problems keeping your blood glucose in your target range.  You are having frequent episodes of hypoglycemia.  You feel you might be having side effects from your medicines.  You are not sure why your blood glucose is dropping so low.  You notice a change in vision or a new problem with your vision. SEEK IMMEDIATE MEDICAL CARE IF:   Confusion develops.  A change in mental status occurs.  The inability to swallow develops.  Fainting occurs. Document Released: 09/27/2005 Document Revised: 10/02/2013 Document Reviewed: 01/24/2012 [facility] Patient Information 2015 [place], [place]. This information is not intended to replace advice given to you by your health care provider. Make sure you discuss any questions you have with your health care provider.

## 2014-07-02 ENCOUNTER — Ambulatory Visit (INDEPENDENT_AMBULATORY_CARE_PROVIDER_SITE_OTHER): Payer: Self-pay | Admitting: Family Medicine

## 2014-07-02 VITALS — BP 128/78 | Wt 269.0 lb

## 2014-07-02 DIAGNOSIS — E119 Type 2 diabetes mellitus without complications: Secondary | ICD-10-CM

## 2014-07-02 LAB — COMPLETE METABOLIC PANEL WITH GFR
ALT: 9 U/L (ref 0–35)
AST: 11 U/L (ref 0–37)
Albumin: 4.1 g/dL (ref 3.5–5.2)
Alkaline Phosphatase: 43 U/L (ref 39–117)
BUN: 14 mg/dL (ref 6–23)
CO2: 27 meq/L (ref 19–32)
CREATININE: 0.77 mg/dL (ref 0.50–1.10)
Calcium: 9.3 mg/dL (ref 8.4–10.5)
Chloride: 101 mEq/L (ref 96–112)
GFR, Est Non African American: >89 mL/min
GLUCOSE: 295 mg/dL — AB (ref 70–99)
Potassium: 4 mEq/L (ref 3.5–5.3)
SODIUM: 137 meq/L (ref 135–145)
TOTAL PROTEIN: 6.7 g/dL (ref 6.0–8.3)
Total Bilirubin: 0.5 mg/dL (ref 0.2–1.2)

## 2014-07-02 NOTE — Progress Notes (Signed)
Patient presents today for 3 month diabetes follow-up as part of the employer-sponsored [employer] program. Current diabetes regimen includes Januvia, Metformin, and Lantus. Patient also continues on daily ASA, ACEi, and most recently statin. Patient's last MD appt was with Dr. Chilton Si yesterday. Patient will follow-up with PCP for DM management in 3 weeks, at which time adjustments will be made if needed. Atorvastatin was added at recent visit. No other med changes. Of note, patient has recently had two fainting episodes. She does not know if these are glucose related as she did not test glucose either time. They presented as nausea, sweating, and overall not feeling well and was soon followed by fainting on two occasions. She has had a workup by MD and will follow-up in 3 weeks to ensure these do not continue.   Diabetes Assessment: Type of Diabetes: Type 2; checks feet daily; does not use glucometer; takes an aspirin a day; takes medications as prescribed; Sees Diabetes provider 3 times per year; MD managing Diabetes Karyl Kinnier, MD; checks blood glucose once daily; Highest CBG >200; Lowest CBG 140; Other Diabetes History: Current med regimen includes Januvia 100 mg daily, Metformin 1000 mg twice daily, and most recently Lantus 30 units daily. Patient started Lantus in July at 8 units daily and has been working up to the current 30 units daily. Glucose remains elevated, MD to make adjustments if necessary at next follow-up. Patient has made great improvement in medications compliance (specifically second metformin dose) but continues to work on this. Patient did not bring meter today but reports testing daily at this time, which is a significant improvement. MD has instructed her to test three times daily and to keep a glucose log. Patient reports two fainting episodes that my have been due to hypoglycemia. It is uncertain since patient did not test glucose. Patient received a full workup for these  episodes. have encouraged her to test at the first sign of hypoglycemia and have provided a handout and verbal instruction on appropriate correction of low glucose. Patient denies signs and symptoms of neuropathy in feet. Full foot exam and microfilament testing was done at most recent appt. A1c has improved today and is now 8.2 (prev 9.1 in March).   Lifestyle Factors: Diet - Patient has attempted to maintain a healthy diet. She continues making good snack choices and limitng pasta and potatoes. Although she continues to eat these, she has reduced portion sizes and frequency. She continues to have room for improvement. Specifically since starting Lantus, patient is trying to eat breakfast every morning. 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   Assessment: Pt presents today for DM follow-up. She is working to improve med compliance and has maintained changes to diet. Patient has increased exercise and will work to maintain this lifestyle change. A1c remains elevated but has improved and is now 8.2 (prev >9). We have continued to set goals and I am hopeful patient will be motivated to continue dietary changes. Also, pt is now on track with PCP visits. We will follow-up in 3  months. I am hopeful pts A1c and diabetes control will continue to improve.  Plan: 1) Return to see Dr. Chilton Si in 3 weeks for follow-up 2) Diet - eat three meals a day and snacks as needed. Limit meals to 45 g carb and snacks to 15 g carb. 3) Exercise - Continue walking at least 3 times  per week 4) Medication compliance, continue to improve in this area 5) A1c of 8.2 today, great job, keep working! 6) Follow-up in 3 months on 09/25/14 @ 10:00 am

## 2014-08-28 ENCOUNTER — Encounter: Payer: Self-pay | Admitting: Family Medicine

## 2014-08-28 NOTE — Progress Notes (Signed)
Patient ID: Renee HastenWanda L Nickles, female   DOB: 04/21/1976, 38 y.o.   MRN: [id number] Reviewed: Agree with the documentation and management of [facility].

## 2014-09-09 ENCOUNTER — Other Ambulatory Visit: Payer: Self-pay | Admitting: Family Medicine

## 2014-10-04 ENCOUNTER — Emergency Department (HOSPITAL_COMMUNITY): Payer: 59

## 2014-10-04 ENCOUNTER — Encounter (HOSPITAL_COMMUNITY): Payer: Self-pay

## 2014-10-04 ENCOUNTER — Emergency Department (HOSPITAL_COMMUNITY)
Admission: EM | Admit: 2014-10-04 | Discharge: 2014-10-04 | Disposition: A | Discharge status: Home / Self Care | Payer: 59 | Attending: Emergency Medicine | Admitting: Emergency Medicine

## 2014-10-04 DIAGNOSIS — R110 Nausea: Secondary | ICD-10-CM | POA: Diagnosis not present

## 2014-10-04 DIAGNOSIS — E119 Type 2 diabetes mellitus without complications: Secondary | ICD-10-CM | POA: Insufficient documentation

## 2014-10-04 DIAGNOSIS — Z794 Long term (current) use of insulin: Secondary | ICD-10-CM | POA: Insufficient documentation

## 2014-10-04 DIAGNOSIS — E669 Obesity, unspecified: Secondary | ICD-10-CM | POA: Diagnosis not present

## 2014-10-04 DIAGNOSIS — R61 Generalized hyperhidrosis: Secondary | ICD-10-CM | POA: Diagnosis not present

## 2014-10-04 DIAGNOSIS — Z720 Tobacco use: Secondary | ICD-10-CM | POA: Diagnosis not present

## 2014-10-04 DIAGNOSIS — R55 Syncope and collapse: Secondary | ICD-10-CM | POA: Insufficient documentation

## 2014-10-04 DIAGNOSIS — Z3202 Encounter for pregnancy test, result negative: Secondary | ICD-10-CM | POA: Diagnosis not present

## 2014-10-04 DIAGNOSIS — I10 Essential (primary) hypertension: Secondary | ICD-10-CM | POA: Diagnosis not present

## 2014-10-04 DIAGNOSIS — Z79899 Other long term (current) drug therapy: Secondary | ICD-10-CM | POA: Insufficient documentation

## 2014-10-04 DIAGNOSIS — R51 Headache: Secondary | ICD-10-CM | POA: Diagnosis not present

## 2014-10-04 DIAGNOSIS — R5383 Other fatigue: Secondary | ICD-10-CM | POA: Diagnosis not present

## 2014-10-04 HISTORY — DX: Type 2 diabetes mellitus without complications: E11.9

## 2014-10-04 LAB — URINALYSIS, ROUTINE W REFLEX MICROSCOPIC
Bilirubin Urine: NEGATIVE
Glucose, UA: >1000 mg/dL — AB
HGB URINE DIPSTICK: NEGATIVE
Ketones, ur: NEGATIVE mg/dL
Leukocytes, UA: NEGATIVE
Nitrite: NEGATIVE
Protein, ur: NEGATIVE mg/dL
SPECIFIC GRAVITY, URINE: 1.028 (ref 1.005–1.030)
Urobilinogen, UA: 0.2 mg/dL (ref 0.0–1.0)
pH: 5 (ref 5.0–8.0)

## 2014-10-04 LAB — COMPREHENSIVE METABOLIC PANEL
ALBUMIN: 4 g/dL (ref 3.5–5.2)
ALK PHOS: 49 U/L (ref 39–117)
ALT: 15 U/L (ref 0–35)
AST: 18 U/L (ref 0–37)
Anion gap: 11 (ref 5–15)
BILIRUBIN TOTAL: 0.6 mg/dL (ref 0.3–1.2)
BUN: 15 mg/dL (ref 6–23)
CO2: 23 mmol/L (ref 19–32)
Calcium: 9.1 mg/dL (ref 8.4–10.5)
Chloride: 104 mEq/L (ref 96–112)
Creatinine, Ser: 0.79 mg/dL (ref 0.50–1.10)
GFR calc Af Amer: >90 mL/min (ref >90)
GFR calc non Af Amer: >90 mL/min (ref >90)
Glucose, Bld: 328 mg/dL — ABNORMAL HIGH (ref 70–99)
POTASSIUM: 4.1 mmol/L (ref 3.5–5.1)
Sodium: 138 mmol/L (ref 135–145)
TOTAL PROTEIN: 6.8 g/dL (ref 6.0–8.3)

## 2014-10-04 LAB — URINE MICROSCOPIC-ADD ON

## 2014-10-04 LAB — CBC
HCT: 44.8 % (ref 36.0–46.0)
Hemoglobin: 15.3 g/dL — ABNORMAL HIGH (ref 12.0–15.0)
MCH: 30.7 pg (ref 26.0–34.0)
MCHC: 34.2 g/dL (ref 30.0–36.0)
MCV: 89.8 fL (ref 78.0–100.0)
Platelets: 150 10*3/uL (ref 150–400)
RBC: 4.99 MIL/uL (ref 3.87–5.11)
RDW: 13.1 % (ref 11.5–15.5)
WBC: 7.8 10*3/uL (ref 4.0–10.5)

## 2014-10-04 LAB — I-STAT TROPONIN, ED: TROPONIN I, POC: 0 ng/mL (ref 0.00–0.08)

## 2014-10-04 LAB — POC URINE PREG, ED: PREG TEST UR: NEGATIVE

## 2014-10-04 MED ORDER — PROMETHAZINE HCL 25 MG/ML IJ SOLN
25.0000 mg | Freq: Once | INTRAMUSCULAR | Status: AC
  Administered 2014-10-04: 25 mg via INTRAVENOUS
  Filled 2014-10-04: qty 1

## 2014-10-04 MED ORDER — ONDANSETRON HCL 4 MG/2ML IJ SOLN
4.0000 mg | Freq: Once | INTRAMUSCULAR | Status: AC
  Administered 2014-10-04: 4 mg via INTRAVENOUS
  Filled 2014-10-04: qty 2

## 2014-10-04 MED ORDER — ACETAMINOPHEN 500 MG PO TABS
1000.0000 mg | ORAL_TABLET | Freq: Once | ORAL | Status: AC
  Administered 2014-10-04: 1000 mg via ORAL
  Filled 2014-10-04: qty 2

## 2014-10-04 MED ORDER — SODIUM CHLORIDE 0.9 % IV BOLUS (SEPSIS)
1000.0000 mL | Freq: Once | INTRAVENOUS | Status: AC
  Administered 2014-10-04: 1000 mL via INTRAVENOUS

## 2014-10-04 NOTE — ED Notes (Signed)
Pt. Is employee of [employer]. 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 Pt. Sat down in chair. Pt. States she has a headache. Staff says she had + LOC. Denies hitting head. Pt. CBG was in the mid 300's.  Pt. Alert and oriented.

## 2014-10-04 NOTE — Discharge Instructions (Signed)
Follow up with your primary care doctor.  Syncope Syncope is a medical term for fainting or passing out. This means you lose consciousness and drop to the ground. People are generally unconscious for less than 5 minutes. You may have some muscle twitches for up to 15 seconds before waking up and returning to normal. Syncope occurs more often in older adults, but it can happen to anyone. While most causes of syncope are not dangerous, syncope can be a sign of a serious medical problem. It is important to seek medical care.  CAUSES  Syncope is caused by a sudden drop in blood flow to the brain. The specific cause is often not determined. Factors that can bring on syncope include:  Taking medicines that lower blood pressure.  Sudden changes in posture, such as standing up quickly.  Taking more medicine than prescribed.  Standing in one place for too long.  Seizure disorders.  Dehydration and excessive exposure to heat.  Low blood sugar (hypoglycemia).  Straining to have a bowel movement.  Heart disease, irregular heartbeat, or other circulatory problems.  Fear, emotional distress, seeing blood, or severe pain. SYMPTOMS  Right before fainting, you may:  Feel dizzy or light-headed.  Feel nauseous.  See all white or all black in your field of vision.  Have cold, clammy skin. DIAGNOSIS  Your health care provider will ask about your symptoms, perform a physical exam, and perform an electrocardiogram (ECG) to record the electrical activity of your heart. Your health care provider may also perform other heart or blood tests to determine the cause of your syncope which may include:  Transthoracic echocardiogram (TTE). During echocardiography, sound waves are used to evaluate how blood flows through your heart.  Transesophageal echocardiogram (TEE).  Cardiac monitoring. This allows your health care provider to monitor your heart rate and rhythm in real time.  Holter monitor. This is  a portable device that records your heartbeat and can help diagnose heart arrhythmias. It allows your health care provider to track your heart activity for several days, if needed.  Stress tests by exercise or by giving medicine that makes the heart beat faster. TREATMENT  In most cases, no treatment is needed. Depending on the cause of your syncope, your health care provider may recommend changing or stopping some of your medicines. HOME CARE INSTRUCTIONS  Have someone stay with you until you feel stable.  Do not drive, use machinery, or play sports until your health care provider says it is okay.  Keep all follow-up appointments as directed by your health care provider.  Lie down right away if you start feeling like you might faint. Breathe deeply and steadily. Wait until all the symptoms have passed.  Drink enough fluids to keep your urine clear or pale yellow.  If you are taking blood pressure or heart medicine, get up slowly and take several minutes to sit and then stand. This can reduce dizziness. SEEK IMMEDIATE MEDICAL CARE IF:   You have a severe headache.  You have unusual pain in the chest, abdomen, or back.  You are bleeding from your mouth or rectum, or you have black or tarry stool.  You have an irregular or very fast heartbeat.  You have pain with breathing.  You have repeated fainting or seizure-like jerking during an episode.  You faint when sitting or lying down.  You have confusion.  You have trouble walking.  You have severe weakness.  You have vision problems. If you fainted, call your local  emergency services (911 in [place] Do not drive yourself to the hospital.  MAKE SURE YOU:  Understand these instructions.  Will watch your condition.  Will get help right away if you are not doing well or get worse. Document Released: 09/27/2005 Document Revised: 10/02/2013 Document Reviewed: 11/26/2011 [facility] Patient Information 2015 [place], [place]. This  information is not intended to replace advice given to you by your health care provider. Make sure you discuss any questions you have with your health care provider.  Vasovagal Syncope, Adult Syncope, commonly known as fainting, is a temporary loss of consciousness. It occurs when the blood flow to the brain is reduced. Vasovagal syncope (also called neurocardiogenic syncope) is a fainting spell in which the blood flow to the brain is reduced because of a sudden drop in heart rate and blood pressure. Vasovagal syncope occurs when the brain and the cardiovascular system (blood vessels) do not adequately communicate and respond to each other. This is the most common cause of fainting. It often occurs in response to fear or some other type of emotional or physical stress. The body has a reaction in which the heart starts beating too slowly or the blood vessels expand, reducing blood pressure. This type of fainting spell is generally considered harmless. However, injuries can occur if a person takes a sudden fall during a fainting spell.  CAUSES  Vasovagal syncope occurs when a person's blood pressure and heart rate decrease suddenly, usually in response to a trigger. Many things and situations can trigger an episode. Some of these include:   Pain.   Fear.   The sight of blood or medical procedures, such as blood being drawn from a vein.   Common activities, such as coughing, swallowing, stretching, or going to the bathroom.   Emotional stress.   Prolonged standing, especially in a warm environment.   Lack of sleep or rest.   Prolonged lack of food.   Prolonged lack of fluids.   Recent illness.  The use of certain drugs that affect blood pressure, such as cocaine, alcohol, marijuana, inhalants, and opiates.  SYMPTOMS  Before the fainting episode, you may:   Feel dizzy or light headed.   Become pale.  Sense that you are going to faint.   Feel like the room is spinning.    Have tunnel vision, only seeing directly in front of you.   Feel sick to your stomach (nauseous).   See spots or slowly lose vision.   Hear ringing in your ears.   Have a headache.   Feel warm and sweaty.   Feel a sensation of pins and needles. During the fainting spell, you will generally be unconscious for no longer than a couple minutes before waking up and returning to normal. If you get up too quickly before your body can recover, you may faint again. Some twitching or jerky movements may occur during the fainting spell.  DIAGNOSIS  Your caregiver will ask about your symptoms, take a medical history, and perform a physical exam. Various tests may be done to rule out other causes of fainting. These may include blood tests and tests to check the heart, such as electrocardiography, echocardiography, and possibly an electrophysiology study. When other causes have been ruled out, a test may be done to check the body's response to changes in position (tilt table test). TREATMENT  Most cases of vasovagal syncope do not require treatment. Your caregiver may recommend ways to avoid fainting triggers and may provide home strategies for preventing  fainting. If you must be exposed to a possible trigger, you can drink additional fluids to help reduce your chances of having an episode of vasovagal syncope. If you have warning signs of an oncoming episode, you can respond by positioning yourself favorably (lying down). If your fainting spells continue, you may be given medicines to prevent fainting. Some medicines may help make you more resistant to repeated episodes of vasovagal syncope. Special exercises or compression stockings may be recommended. In rare cases, the surgical placement of a pacemaker is considered. HOME CARE INSTRUCTIONS   Learn to identify the warning signs of vasovagal syncope.   Sit or lie down at the first warning sign of a fainting spell. If sitting, put your head  down between your legs. If you lie down, swing your legs up in the air to increase blood flow to the brain.   Avoid hot tubs and saunas.  Avoid prolonged standing.  Drink enough fluids to keep your urine clear or pale yellow. Avoid caffeine.  Increase salt in your diet as directed by your caregiver.   If you have to stand for a long time, perform movements such as:   Crossing your legs.   Flexing and stretching your leg muscles.   Squatting.   Moving your legs.   Bending over.   Only take over-the-counter or prescription medicines as directed by your caregiver. Do not suddenly stop any medicines without asking your caregiver first. SEEK MEDICAL CARE IF:   Your fainting spells continue or happen more frequently in spite of treatment.   You lose consciousness for more than a couple minutes.  You have fainting spells during or after exercising or after being startled.   You have new symptoms that occur with the fainting spells, such as:   Shortness of breath.  Chest pain.   Irregular heartbeat.   You have episodes of twitching or jerky movements that last longer than a few seconds.  You have episodes of twitching or jerky movements without obvious fainting. SEEK IMMEDIATE MEDICAL CARE IF:   You have injuries or bleeding after a fainting spell.   You have episodes of twitching or jerky movements that last longer than 5 minutes.   You have more than one spell of twitching or jerky movements before returning to consciousness after fainting. MAKE SURE YOU:   Understand these instructions.  Will watch your condition.  Will get help right away if you are not doing well or get worse. Document Released: 09/13/2012 Document Reviewed: 09/13/2012 [facility] Patient Information 2015 [place], [place]. This information is not intended to replace advice given to you by your health care provider. Make sure you discuss any questions you have with your health care  provider.

## 2014-10-04 NOTE — ED Notes (Signed)
Patient transported to X-ray 

## 2014-10-04 NOTE — ED Provider Notes (Signed)
CSN: [id number]     Arrival date & time 10/04/14  1056 History   First MD Initiated Contact with Patient 10/04/14 1105     Chief Complaint  Patient presents with  . Near Syncope     (Consider location/radiation/quality/duration/timing/severity/associated sxs/prior Treatment) HPI Comments:  38 year old obese female with a past medical history of diabetes, hypertension and polycystic ovarian syndrome presenting after a syncopal episode occurring just prior to arrival. 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 She states she sat down in a chair , and that is all she can remember. Her coworkers told her that she passed out for a few seconds. CBG around 350. Currently she is feeling very fatigued, has a generalized headache, dry mouth and is nauseated. Denies chest pain, shortness of breath, vomiting, vision changes, dizziness or confusion. States she's had symptoms like this in the past twice when her blood sugar was too low. She took all her medications as prescribed this morning before she went to work. She had breakfast this morning.  The history is provided by the patient and a friend.    Past Medical History  Diagnosis Date  . Diabetes mellitus without complication   . Hypertension   . Polycystic ovarian syndrome    Past Surgical History  Procedure Laterality Date  . Abdominal hysterectomy    . Cyst removal neck    . Knee surgery     No family history on file. History  Substance Use Topics  . Smoking status: Current Every Day Smoker -- 0.50 packs/day  . Smokeless tobacco: Not on file  . Alcohol Use: No   OB History    No data available     Review of Systems  Constitutional: Positive for diaphoresis and fatigue.  Gastrointestinal: Positive for nausea.  Neurological: Positive for syncope and headaches.  All other systems reviewed and are  negative.     Allergies  Morphine and related and Reglan  Home Medications   Prior to Admission medications   Medication Sig Start Date End Date Taking? Authorizing Provider  ATORVASTATIN CALCIUM PO Take 1 tablet by mouth daily.   Yes Historical Provider, MD  HYDROCHLOROTHIAZIDE PO Take 1 tablet by mouth daily.   Yes Historical Provider, MD  insulin glargine (LANTUS) 100 UNIT/ML injection Inject 30 Units into the skin at bedtime.   Yes Historical Provider, MD  LISINOPRIL PO Take 1 tablet by mouth daily.   Yes Historical Provider, MD  metFORMIN (GLUCOPHAGE) 1000 MG tablet Take 1,000 mg by mouth 2 (two) times daily with a meal.   Yes Historical Provider, MD  SitaGLIPtin Phosphate (JANUVIA PO) Take 1 tablet by mouth daily.   Yes Historical Provider, MD   BP 145/72 mmHg  Pulse 80  Temp(Src) 98.7 F (37.1 C) (Oral)  Resp 29  SpO2 93% Physical Exam  Constitutional: She is oriented to person, place, and time. She appears well-developed and well-nourished. No distress.  Obese.  HENT:  Head: Normocephalic and atraumatic.  Mouth/Throat: Oropharynx is clear and moist.  Dry MM.  Eyes: Conjunctivae and EOM are normal. Pupils are equal, round, and reactive to light.  Neck: Normal range of motion. Neck supple. No JVD present.  Cardiovascular: Normal rate, regular rhythm, normal heart sounds and intact distal pulses.   No extremity edema.  Pulmonary/Chest: Effort normal and breath sounds normal. No respiratory distress.  Abdominal: Soft. Bowel sounds  are normal. There is no tenderness.  Musculoskeletal: Normal range of motion. She exhibits no edema.  Neurological: She is alert and oriented to person, place, and time. She has normal strength. No sensory deficit.  Speech fluent, goal oriented. Moves limbs without ataxia. Equal grip strength bilateral.  Skin: Skin is warm. She is diaphoretic.  Psychiatric: She has a normal mood and affect. Her behavior is normal.  Nursing note and vitals  reviewed.   ED Course  Procedures (including critical care time) Labs Review Labs Reviewed  CBC - Abnormal; Notable for the following:    Hemoglobin 15.3 (*)    All other components within normal limits  COMPREHENSIVE METABOLIC PANEL - Abnormal; Notable for the following:    Glucose, Bld 328 (*)    All other components within normal limits  URINALYSIS, ROUTINE W REFLEX MICROSCOPIC - Abnormal; Notable for the following:    Glucose, UA >1000 (*)    All other components within normal limits  URINE MICROSCOPIC-ADD ON - Abnormal; Notable for the following:    Squamous Epithelial / LPF FEW (*)    Bacteria, UA FEW (*)    All other components within normal limits  I-STAT TROPOININ, ED  POC URINE PREG, ED    Imaging Review Dg Chest 2 View  10/04/2014   CLINICAL DATA:  Syncope and dizziness.  EXAM: CHEST  2 VIEW  COMPARISON:  None.  FINDINGS: Heart size and mediastinal contours are within normal limits. Both lungs are clear. Visualized skeletal structures are unremarkable.  IMPRESSION: No acute cardiopulmonary disease.   Electronically Signed   By: Drusilla Kanner M.D.   On: 10/04/2014 11:54   Ct Head Wo Contrast  10/04/2014   CLINICAL DATA:  Syncopal episode  EXAM: CT HEAD WITHOUT CONTRAST  TECHNIQUE: Contiguous axial images were obtained from the base of the skull through the vertex without intravenous contrast.  COMPARISON:  None.  FINDINGS: The bony calvarium is intact. The ventricles are of normal size and configuration. No findings to suggest acute hemorrhage, acute infarction or space-occupying mass lesion are noted.  IMPRESSION: No acute intracranial abnormality is noted.   Electronically Signed   By: Alcide Clever M.D.   On: 10/04/2014 12:05     EKG Interpretation   Date/Time:  Friday October 04 2014 11:01:38 EST Ventricular Rate:  85 PR Interval:  152 QRS Duration: 84 QT Interval:  382 QTC Calculation: 454 R Axis:   27 Text Interpretation:  Sinus rhythm Confirmed by  HARRISON  MD, FORREST  ([id number]) on 10/04/2014 11:43:13 AM      MDM   Final diagnoses:  Syncope   Patient presenting after having a syncopal episode while at work. On arrival  She appeared diaphoretic with dry mucous membranes. She  Vital signs stable.  She is not orthostatic. Admits to history of the same when she has low blood sugar, however her blood sugar was noted to be around 350. Blood  Glucose here in the ED on CMP 328.  Alert and oriented. No focal neurologic deficits. Head CT obtained given episode of syncope without hypoglycemia to evaluate for [facility] given sudden onset headache when she became alert. Head CT normal. Cardiac workup negative. No CP, SOB. Doubt cardiac or neurogenic syncope. After receiving IV fluid bolus, pt no longer diaphoretic, ambulates without difficulty. Labs without acute finding. Lightheadedness has not returned in the ED. She is stable for d/c. F/u with PCP. Return precautions given. Patient states understanding of treatment care plan and is agreeable.  Discussed with attending Dr. Romeo AppleHarrison who also evaluated patient and agrees with plan of care.    Kathrynn SpeedRobyn M Siniya Lichty, PA-C 10/04/14 1346  Purvis SheffieldForrest Harrison, MD 10/05/14 [phone]

## 2014-10-07 ENCOUNTER — Encounter: Payer: Self-pay | Admitting: Family Medicine

## 2014-10-14 ENCOUNTER — Other Ambulatory Visit: Payer: Self-pay | Admitting: *Deleted

## 2014-10-14 NOTE — Telephone Encounter (Signed)
FYI Dr. Neva Seat-  Pharmacy called to request a VO to refill Lantus- discrepancy over dose. Researched chart to find that pt was suppose to follow up with Dr. Neva Seat in October since lantus was not effective enough to bring sugar below 300 while using 30 units nightly. Algis Downs pharmacist pt needs to RTC today for re evaluation. They are calling pt. I have also placed a call to pt and left a message for her to RTC.

## 2014-10-15 NOTE — Telephone Encounter (Signed)
Agree - needs office visit, but if short term supply needed to be seen in next week, can send that in so she does not come off med completely.

## 2014-10-16 ENCOUNTER — Ambulatory Visit (INDEPENDENT_AMBULATORY_CARE_PROVIDER_SITE_OTHER): Payer: 59 | Admitting: Family Medicine

## 2014-10-16 ENCOUNTER — Encounter: Payer: Self-pay | Admitting: Family Medicine

## 2014-10-16 VITALS — BP 144/86 | HR 76 | Temp 98.3°F | Resp 20 | Ht 67.0 in | Wt 268.0 lb

## 2014-10-16 DIAGNOSIS — F329 Major depressive disorder, single episode, unspecified: Secondary | ICD-10-CM

## 2014-10-16 DIAGNOSIS — E785 Hyperlipidemia, unspecified: Secondary | ICD-10-CM

## 2014-10-16 DIAGNOSIS — R509 Fever, unspecified: Secondary | ICD-10-CM

## 2014-10-16 DIAGNOSIS — I10 Essential (primary) hypertension: Secondary | ICD-10-CM

## 2014-10-16 DIAGNOSIS — F32A Depression, unspecified: Secondary | ICD-10-CM

## 2014-10-16 DIAGNOSIS — E119 Type 2 diabetes mellitus without complications: Secondary | ICD-10-CM

## 2014-10-16 DIAGNOSIS — J069 Acute upper respiratory infection, unspecified: Secondary | ICD-10-CM

## 2014-10-16 DIAGNOSIS — Z13220 Encounter for screening for lipoid disorders: Secondary | ICD-10-CM

## 2014-10-16 LAB — POCT INFLUENZA A/B
Influenza A, POC: NEGATIVE
Influenza B, POC: NEGATIVE

## 2014-10-16 LAB — POCT RAPID STREP A (OFFICE): Rapid Strep A Screen: NEGATIVE

## 2014-10-16 LAB — GLUCOSE, POCT (MANUAL RESULT ENTRY): POC GLUCOSE: 221 mg/dL — AB (ref 70–99)

## 2014-10-16 LAB — POCT GLYCOSYLATED HEMOGLOBIN (HGB A1C): Hemoglobin A1C: 8.1

## 2014-10-16 MED ORDER — ATORVASTATIN CALCIUM 10 MG PO TABS: 10.0000 mg | ORAL_TABLET | Freq: Every day | ORAL | Status: DC

## 2014-10-16 MED ORDER — DULOXETINE HCL 60 MG PO CPEP: ORAL_CAPSULE | ORAL | Status: DC

## 2014-10-16 MED ORDER — METFORMIN HCL 1000 MG PO TABS: 1000.0000 mg | ORAL_TABLET | Freq: Two times a day (BID) | ORAL | Status: DC

## 2014-10-16 MED ORDER — JANUVIA 100 MG PO TABS: ORAL_TABLET | ORAL | Status: DC

## 2014-10-16 MED ORDER — HYDROCHLOROTHIAZIDE 25 MG PO TABS: ORAL_TABLET | ORAL | Status: DC

## 2014-10-16 MED ORDER — INSULIN GLARGINE 100 UNIT/ML ~~LOC~~ SOLN: 32.0000 [IU] | Freq: Every day | SUBCUTANEOUS | Status: DC

## 2014-10-16 MED ORDER — LISINOPRIL 5 MG PO TABS: ORAL_TABLET | ORAL | Status: DC

## 2014-10-16 NOTE — Patient Instructions (Signed)
Increase lantus to 32 units for now. If in 1 week the blood sugars are remaining in the 200's - increase to 34 units.  I will refer you to nutritionist. Do not skip meals.  Keep a record of your blood pressures and blood sugars outside of the office and bring them to the next office visit in 3 months. If your blood pressures remain over 140/90 - return in next 1 month for possible medication changes.  Saline nasal spray atleast 4 times per day, over the counter mucinex or mucinex DM if needed for cough.  Sore throat lozenges, and drink plenty of fluids.  Return to the clinic or go to the nearest emergency room if any of your symptoms worsen or new symptoms occur.  Upper Respiratory Infection, Adult An upper respiratory infection (URI) is also sometimes known as the common cold. The upper respiratory tract includes the nose, sinuses, throat, trachea, and bronchi. Bronchi are the airways leading to the lungs. Most people improve within 1 week, but symptoms can last up to 2 weeks. A residual cough may last even longer.  CAUSES Many different viruses can infect the tissues lining the upper respiratory tract. The tissues become irritated and inflamed and often become very moist. Mucus production is also common. A cold is contagious. You can easily spread the virus to others by oral contact. This includes kissing, sharing a glass, coughing, or sneezing. Touching your mouth or nose and then touching a surface, which is then touched by another person, can also spread the virus. SYMPTOMS  Symptoms typically develop 1 to 3 days after you come in contact with a cold virus. Symptoms vary from person to person. They may include:  Runny nose.  Sneezing.  Nasal congestion.  Sinus irritation.  Sore throat.  Loss of voice (laryngitis).  Cough.  Fatigue.  Muscle aches.  Loss of appetite.  Headache.  Low-grade fever. DIAGNOSIS  You might diagnose your own cold based on familiar symptoms, since most  people get a cold 2 to 3 times a year. Your caregiver can confirm this based on your exam. Most importantly, your caregiver can check that your symptoms are not due to another disease such as strep throat, sinusitis, pneumonia, asthma, or epiglottitis. Blood tests, throat tests, and X-rays are not necessary to diagnose a common cold, but they may sometimes be helpful in excluding other more serious diseases. Your caregiver will decide if any further tests are required. RISKS AND COMPLICATIONS  You may be at risk for a more severe case of the common cold if you smoke cigarettes, have chronic heart disease (such as heart failure) or lung disease (such as asthma), or if you have a weakened immune system. The very young and very old are also at risk for more serious infections. Bacterial sinusitis, middle ear infections, and bacterial pneumonia can complicate the common cold. The common cold can worsen asthma and chronic obstructive pulmonary disease (COPD). Sometimes, these complications can require emergency medical care and may be life-threatening. PREVENTION  The best way to protect against getting a cold is to practice good hygiene. Avoid oral or hand contact with people with cold symptoms. Wash your hands often if contact occurs. There is no clear evidence that vitamin C, vitamin E, echinacea, or exercise reduces the chance of developing a cold. However, it is always recommended to get plenty of rest and practice good nutrition. TREATMENT  Treatment is directed at relieving symptoms. There is no cure. Antibiotics are not effective, because the  infection is caused by a virus, not by bacteria. Treatment may include:  Increased fluid intake. Sports drinks offer valuable electrolytes, sugars, and fluids.  Breathing heated mist or steam (vaporizer or shower).  Eating chicken soup or other clear broths, and maintaining good nutrition.  Getting plenty of rest.  Using gargles or lozenges for  comfort.  Controlling fevers with ibuprofen or acetaminophen as directed by your caregiver.  Increasing usage of your inhaler if you have asthma. Zinc gel and zinc lozenges, taken in the first 24 hours of the common cold, can shorten the duration and lessen the severity of symptoms. Pain medicines may help with fever, muscle aches, and throat pain. A variety of non-prescription medicines are available to treat congestion and runny nose. Your caregiver can make recommendations and may suggest nasal or lung inhalers for other symptoms.  HOME CARE INSTRUCTIONS   Only take over-the-counter or prescription medicines for pain, discomfort, or fever as directed by your caregiver.  Use a warm mist humidifier or inhale steam from a shower to increase air moisture. This may keep secretions moist and make it easier to breathe.  Drink enough water and fluids to keep your urine clear or pale yellow.  Rest as needed.  Return to work when your temperature has returned to normal or as your caregiver advises. You may need to stay home longer to avoid infecting others. You can also use a face mask and careful hand washing to prevent spread of the virus. SEEK MEDICAL CARE IF:   After the first few days, you feel you are getting worse rather than better.  You need your caregiver's advice about medicines to control symptoms.  You develop chills, worsening shortness of breath, or brown or red sputum. These may be signs of pneumonia.  You develop yellow or brown nasal discharge or pain in the face, especially when you bend forward. These may be signs of sinusitis.  You develop a fever, swollen neck glands, pain with swallowing, or white areas in the back of your throat. These may be signs of strep throat. SEEK IMMEDIATE MEDICAL CARE IF:   You have a fever.  You develop severe or persistent headache, ear pain, sinus pain, or chest pain.  You develop wheezing, a prolonged cough, cough up blood, or have a  change in your usual mucus (if you have chronic lung disease).  You develop sore muscles or a stiff neck. Document Released: 03/23/2001 Document Revised: 12/20/2011 Document Reviewed: 01/02/2014 [facility] Patient Information 2015 [place], [place]. This information is not intended to replace advice given to you by your health care provider. Make sure you discuss any questions you have with your health care provider.

## 2014-10-16 NOTE — Progress Notes (Addendum)
Subjective:  This chart was scribed for Renee StaggersJeffrey Shaunda Tipping, MD,  by Veverly FellsHatice Ho,scribe, at [facility].  This patient was seen in room 4 and the patient's care was started at 7:00 PM.    Patient ID: Renee Ho, female    DOB: 06/07/1976, 39 y.o.   MRN: [id number]  HPI  HPI Comments: Renee Ho is a 39 y.o. female who presents to [facility] has a history of Diabetes type 2 insulin dependent.  She was last seen by me September 21st.  She had uncontrolled diabetes with glucose 3/11 at that September visit.  And her last A1C was 8.8 in July of last year. She had reported syncopal events with possible hypoglycemic events at that visit noted these episodes after not eating meals recently.  She was seen September 22nd by the [facility] pharmacist for diabetes assessment.  Per their notes, her lowest blood glucose was 140.  She had been increased up to a total of 30 units a day of Lantus. A1C 8.2 at her september 22 visit with pharmacist. Was advised at her September visit with me to discuss her symptoms at next visit.  Advised her at last visit to eat regular meals and would determine if med changes is needed based on home blood sugar reading over next 2-3 weeks.   Depression: Takes cymbalta 60 mg QD.  Feels this is controlling her symptoms and she is no longer having cyring spells that she used to and no new side effects.    Hypertension Lisinopril 5 mg and HCTZ 25 mg QD.  Denies recent missed dose.     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  Possible LOC but denies hitting head. Apparently her blood sugar was in the 300's. Glucose in the Er was 328 Hemoglobin was 15.3.  EKG sinus rhythm, no acute findings. Head CT'ed normal . Troponin was normal. HCG negative. Received IV fluids.  improved in the Er.  Instructed to follow up with primary care provider.     Diabetes Patient is  here today for follow up as well as medication refill.  She denies recent syncopic episodes.  She states her blood sugar has been running around the 200 range, She has no hypoglycemic readings and the lowest is 110 which was in the morning time. When she had the lowest reading, she had only eaten dinner the night before and checked it at work the next day at 7 pm, she states she had not eaten at all that morning and didn't have a snack before bed the previous night.  90% of the time now eats breakfast lunch and dinner with snacks in between.  [facility].   She has not had any side effects with her medications besides bruising.  She ran out of Lantus 3 days ago (sunday).  She has an apt with an eye doctor in Feb and has seen a dentist in the last year. Urine micro albumin was normal in July 2015 Last lipid panel elevated ldl of 115 in July 2015.  She had a CMP on December 25 with normal LFT's.    Sinus Conjestion She notes of sinus and congestion waking up this morning with coughing, sore throat, runny nose and a fever reading 101. She states that she feels itchy.  Son has a  mild case of the flu and daughter has an URI.   No past issues with numbness, tingling or heart issues.        Review of Systems  Constitutional: Positive for fever. Negative for fatigue and unexpected weight change.  HENT: Positive for congestion, rhinorrhea and sore throat.   Respiratory: Negative for chest tightness and shortness of breath.   Cardiovascular: Negative for chest pain, palpitations and leg swelling.  Gastrointestinal: Negative for abdominal pain and blood in stool.  Neurological: Negative for dizziness, syncope, light-headedness, numbness and headaches.       Objective:   Physical Exam  Constitutional: She is oriented to person, place, and time. She appears well-developed and well-nourished. No distress.  HENT:  Head: Normocephalic and atraumatic.  Right  Ear: Hearing, tympanic membrane, external ear and ear canal normal.  Left Ear: Hearing, tympanic membrane, external ear and ear canal normal.  Nose: Nose normal.  Mouth/Throat: Oropharynx is clear and moist. No oropharyngeal exudate.  Moisture mucosa, no exudate.   Eyes: Conjunctivae and EOM are normal. Pupils are equal, round, and reactive to light.  Neck: Carotid bruit is not present.  Cardiovascular: Normal rate, regular rhythm, normal heart sounds and intact distal pulses.   No murmur heard. Pulmonary/Chest: Effort normal and breath sounds normal. No respiratory distress. She has no wheezes. She has no rhonchi.  Abdominal: Soft. She exhibits no pulsatile midline mass. There is no tenderness.  Neurological: She is alert and oriented to person, place, and time.  Skin: Skin is warm and dry. No rash noted.  Psychiatric: She has a normal mood and affect. Her behavior is normal.  Vitals reviewed.    Filed Vitals:   10/16/14 1756 10/16/14 1943  BP: 148/82 144/86  Pulse: 76   Temp: 98.3 F (36.8 C)   TempSrc: Oral   Resp: 20   Height: 5\' 7"  (1.702 m)   Weight: 268 lb (121.564 kg)   SpO2: 98%     Results for orders placed or performed in visit on 10/16/14  POCT Influenza A/B  Result Value Ref Range   Influenza A, POC Negative    Influenza B, POC Negative   POCT glucose (manual entry)  Result Value Ref Range   POC Glucose 221 (A) 70 - 99 mg/dl  POCT glycosylated hemoglobin (Hb A1C)  Result Value Ref Range   Hemoglobin A1C 8.1   POCT rapid strep A  Result Value Ref Range   Rapid Strep A Screen Negative Negative        Assessment & Plan:   Renee Ho is a 39 y.o. female Type 2 diabetes mellitus without complication - Plan: POCT glucose (manual entry), POCT glycosylated hemoglobin (Hb A1C), metFORMIN (GLUCOPHAGE) 1000 MG tablet, Amb ref to Medical Nutrition Therapy-MNT, insulin glargine (LANTUS) 100 UNIT/ML injection, JANUVIA 100 MG tablet  -still uncontrolled. No  apparent hypoglycemia and denies recent presyncope.   -increase lantus to 32 units, then to 34 units in 1 week if still over 200.   - Maintain regular meals, but will also refer to nutritionist to help in this area.   Fever, unspecified fever cause - Plan: POCT Influenza A/B, POCT rapid strep A, URI (upper respiratory infection) - Plan: POCT Influenza A/B, POCT rapid strep A  -suspected viral URI.  Sx care and rtc precautions discussed.   Hyperlipidemia - Plan: atorvastatin (LIPITOR) 10 MG tablet, Lipid screening - Plan: Lipid panel  -lipids pending. Lipitor 10mg  qd.   Essential hypertension - Plan: lisinopril (PRINIVIL,ZESTRIL) 5  MG tablet, hydrochlorothiazide (HYDRODIURIL) 25 MG tablet  -continue same doses for now, but ifremains over 140/90 outside of office - recheck to discuss dosing changes.   Depression - Plan: DULoxetine (CYMBALTA) 60 MG capsule  -stable. Continue same dose of Cymbalta and recheck in 6 months.   Meds ordered this encounter  Medications  . metFORMIN (GLUCOPHAGE) 1000 MG tablet    Sig: Take 1 tablet (1,000 mg total) by mouth 2 (two) times daily with a meal.    Dispense:  180 tablet    Refill:  1  . lisinopril (PRINIVIL,ZESTRIL) 5 MG tablet    Sig: TAKE 1 TABLET BY MOUTH ONCE DAILY    Dispense:  90 tablet    Refill:  1  . hydrochlorothiazide (HYDRODIURIL) 25 MG tablet    Sig: TAKE 1 TABLET BY MOUTH DAILY.    Dispense:  90 tablet    Refill:  1  . DULoxetine (CYMBALTA) 60 MG capsule    Sig: TAKE 1 CAPSULE BY MOUTH DAILY.    Dispense:  90 capsule    Refill:  1  . atorvastatin (LIPITOR) 10 MG tablet    Sig: Take 1 tablet (10 mg total) by mouth daily.    Dispense:  90 tablet    Refill:  1  . insulin glargine (LANTUS) 100 UNIT/ML injection    Sig: Inject 0.32 mLs (32 Units total) into the skin at bedtime.    Dispense:  10 mL    Refill:  3  . JANUVIA 100 MG tablet    Sig: TAKE 1 TABLET BY MOUTH DAILY.    Dispense:  90 tablet    Refill:  1   Patient  Instructions  Increase lantus to 32 units for now. If in 1 week the blood sugars are remaining in the 200's - increase to 34 units.  I will refer you to nutritionist. Do not skip meals.  Keep a record of your blood pressures and blood sugars outside of the office and bring them to the next office visit in 3 months. If your blood pressures remain over 140/90 - return in next 1 month for possible medication changes.  Saline nasal spray atleast 4 times per day, over the counter mucinex or mucinex DM if needed for cough.  Sore throat lozenges, and drink plenty of fluids.  Return to the clinic or go to the nearest emergency room if any of your symptoms worsen or new symptoms occur.  Upper Respiratory Infection, Adult An upper respiratory infection (URI) is also sometimes known as the common cold. The upper respiratory tract includes the nose, sinuses, throat, trachea, and bronchi. Bronchi are the airways leading to the lungs. Most people improve within 1 week, but symptoms can last up to 2 weeks. A residual cough may last even longer.  CAUSES Many different viruses can infect the tissues lining the upper respiratory tract. The tissues become irritated and inflamed and often become very moist. Mucus production is also common. A cold is contagious. You can easily spread the virus to others by oral contact. This includes kissing, sharing a glass, coughing, or sneezing. Touching your mouth or nose and then touching a surface, which is then touched by another person, can also spread the virus. SYMPTOMS  Symptoms typically develop 1 to 3 days after you come in contact with a cold virus. Symptoms vary from person to person. They may include:  Runny nose.  Sneezing.  Nasal congestion.  Sinus irritation.  Sore throat.  Loss of voice (laryngitis).  Cough.  Fatigue.  Muscle aches.  Loss of appetite.  Headache.  Low-grade fever. DIAGNOSIS  You might diagnose your own cold based on familiar  symptoms, since most people get a cold 2 to 3 times a year. Your caregiver can confirm this based on your exam. Most importantly, your caregiver can check that your symptoms are not due to another disease such as strep throat, sinusitis, pneumonia, asthma, or epiglottitis. Blood tests, throat tests, and X-rays are not necessary to diagnose a common cold, but they may sometimes be helpful in excluding other more serious diseases. Your caregiver will decide if any further tests are required. RISKS AND COMPLICATIONS  You may be at risk for a more severe case of the common cold if you smoke cigarettes, have chronic heart disease (such as heart failure) or lung disease (such as asthma), or if you have a weakened immune system. The very young and very old are also at risk for more serious infections. Bacterial sinusitis, middle ear infections, and bacterial pneumonia can complicate the common cold. The common cold can worsen asthma and chronic obstructive pulmonary disease (COPD). Sometimes, these complications can require emergency medical care and may be life-threatening. PREVENTION  The best way to protect against getting a cold is to practice good hygiene. Avoid oral or hand contact with people with cold symptoms. Wash your hands often if contact occurs. There is no clear evidence that vitamin C, vitamin E, echinacea, or exercise reduces the chance of developing a cold. However, it is always recommended to get plenty of rest and practice good nutrition. TREATMENT  Treatment is directed at relieving symptoms. There is no cure. Antibiotics are not effective, because the infection is caused by a virus, not by bacteria. Treatment may include:  Increased fluid intake. Sports drinks offer valuable electrolytes, sugars, and fluids.  Breathing heated mist or steam (vaporizer or shower).  Eating chicken soup or other clear broths, and maintaining good nutrition.  Getting plenty of rest.  Using gargles or  lozenges for comfort.  Controlling fevers with ibuprofen or acetaminophen as directed by your caregiver.  Increasing usage of your inhaler if you have asthma. Zinc gel and zinc lozenges, taken in the first 24 hours of the common cold, can shorten the duration and lessen the severity of symptoms. Pain medicines may help with fever, muscle aches, and throat pain. A variety of non-prescription medicines are available to treat congestion and runny nose. Your caregiver can make recommendations and may suggest nasal or lung inhalers for other symptoms.  HOME CARE INSTRUCTIONS   Only take over-the-counter or prescription medicines for pain, discomfort, or fever as directed by your caregiver.  Use a warm mist humidifier or inhale steam from a shower to increase air moisture. This may keep secretions moist and make it easier to breathe.  Drink enough water and fluids to keep your urine clear or pale yellow.  Rest as needed.  Return to work when your temperature has returned to normal or as your caregiver advises. You may need to stay home longer to avoid infecting others. You can also use a face mask and careful hand washing to prevent spread of the virus. SEEK MEDICAL CARE IF:   After the first few days, you feel you are getting worse rather than better.  You need your caregiver's advice about medicines to control symptoms.  You develop chills, worsening shortness of breath, or brown or red sputum. These may be signs of pneumonia.  You develop yellow or brown  nasal discharge or pain in the face, especially when you bend forward. These may be signs of sinusitis.  You develop a fever, swollen neck glands, pain with swallowing, or white areas in the back of your throat. These may be signs of strep throat. SEEK IMMEDIATE MEDICAL CARE IF:   You have a fever.  You develop severe or persistent headache, ear pain, sinus pain, or chest pain.  You develop wheezing, a prolonged cough, cough up blood,  or have a change in your usual mucus (if you have chronic lung disease).  You develop sore muscles or a stiff neck. Document Released: 03/23/2001 Document Revised: 12/20/2011 Document Reviewed: 01/02/2014 [facility] Patient Information 2015 [place], [place]. This information is not intended to replace advice given to you by your health care provider. Make sure you discuss any questions you have with your health care provider.       I personally performed the services described in this documentation, which was scribed in my presence. The recorded information has been reviewed and considered, and addended by me as needed.

## 2014-10-17 LAB — LIPID PANEL
CHOLESTEROL: 177 mg/dL (ref 0–200)
HDL: 29 mg/dL — ABNORMAL LOW (ref >39)
LDL Cholesterol: 73 mg/dL (ref 0–99)
Total CHOL/HDL Ratio: 6.1 Ratio
Triglycerides: 376 mg/dL — ABNORMAL HIGH (ref <150)
VLDL: 75 mg/dL — AB (ref 0–40)

## 2014-10-24 ENCOUNTER — Ambulatory Visit (INDEPENDENT_AMBULATORY_CARE_PROVIDER_SITE_OTHER): Payer: Self-pay | Admitting: Family Medicine

## 2014-10-24 VITALS — BP 138/74 | Wt 256.0 lb

## 2014-10-24 DIAGNOSIS — E119 Type 2 diabetes mellitus without complications: Secondary | ICD-10-CM

## 2014-10-24 NOTE — Progress Notes (Signed)
Patient presents today for 3 month diabetes follow-up as part of the employer-sponsored Link to Wellness program. Current diabetes regimen includes Januvia, Metformin, and Lantus. Patient also continues on daily ASA, ACEi, and statin. Patient's last MD appt was with Dr. Chilton SiGreen on 10/17/14. No changes made at this visit except for continued Lantus titration. Of note, patient has recently (10/04/14) had a fainting episode of unknown cause. She was at work when she began to feel warm and need to sit down. She remembers sitting and then waking in the ED being told she had fainted. She received a full work up with no significant findings. She was released and the syncopal episode was attributed to hyperglycemia (>300), dehydration, and fatigue. Patient has had similar episodes in the past so this is something she will continue to monitor for and be aware of. Patient also reports at this appt that she feels her depression is worsening and she has no energy and no desire to do anything other than lay in bed. She is recovering from a respiratory infection as well. In the past Cymbalta has worked well for her but she will speak with MD about evaluating this.   Diabetes Assessment: Type of Diabetes: Type 2; checks feet daily; does not use glucometer; takes an aspirin a day; takes medications as prescribed; MD managing Diabetes Karyl KinnierJeffrey Green, MD; Sees Diabetes provider 3 times per year; checks blood glucose 3-4 times a day; Highest CBG >300; Lowest CBG 110; hypoglycemia frequency None; Other Diabetes History: Current med regimen includes Januvia 100 mg daily, Metformin 1000 mg twice daily, and most recently Lantus 35 units daily. Patient recently had lantus increased at last MD appt. Previously taking 30 units daily and now instructed to increase by 2 units every 2 days until fasting glucose <200. Patient has increased to 35 units over the past week and will continue to do so until glucose at goal. Patient has made great  improvement in medications compliance (specifically second metformin dose). Patient did not bring meter today but reports increased testing up to three times daily at this time, which is a significant improvement. Testing occurs fasting, before lunch, and before bed. Fasting is generally 190-220s and daytime readings range from 110-140s. Patient denies signs and symptoms of neuropathy. A1c has improved slightly and is now 8.1 (prev 8.2 in Sept, 9.1 in March). Pt is up to date on eye exam but will be due again in Feb and will schedule an appt soon.  Smoking Status: Former smoker I failed to assess smoking at this visit, will assess next time. Patient has quit in the past, but I suspect she has resumed smoking at least on occassion.  Lifestyle Factors: Diet - Patient has attempted to maintain a healthy diet but reports that she has not done well recently due to holidays and winter weather. She does continue to make efforts at limiting pasta and potatoes. She reports the following food recall: Breakfast - toast, oatmeal, [place]key sausage, boiled egg Lunch - cafeteria for lunch if working, fixes something at home if off work Futures traderDinner - cook at home, usually a meat and sides (trying to limit potatoes and pasta sides), but sometimes has a quick meal such as cereal We have discussed carb counting and limiting meals to 45 g and snacks to 15 g and have reviewed starchy and non-starchy carbs.  Exercise - walking less now due to cold, but trying to walk once weekly.   Assessment: 1) Count carbs and limit meals to 45 grams  and snacks to 15 grams 2) Exercise at least once per week 4) Continue to stay compliant with medications 5) A1c of 8.1 at last MD appt, continue to improve in this area, goal <8 by next appt 6) Follow-up in 3 months on April 14th @ 9:30 am

## 2014-11-19 ENCOUNTER — Encounter: Payer: Self-pay | Admitting: Family Medicine

## 2014-11-19 NOTE — Progress Notes (Signed)
Patient ID: [name], female   DOB: 04/04/1976, 39 y.o.   MRN: [id number] Reviewed: Agree with documentation and management of our [facility] pharmacologist.

## 2014-12-02 ENCOUNTER — Telehealth: Payer: Self-pay

## 2014-12-02 DIAGNOSIS — E119 Type 2 diabetes mellitus without complications: Secondary | ICD-10-CM

## 2014-12-02 MED ORDER — INSULIN GLARGINE 100 UNIT/ML ~~LOC~~ SOLN: 50.0000 [IU] | Freq: Every day | SUBCUTANEOUS | Status: DC

## 2014-12-02 NOTE — Telephone Encounter (Signed)
I sent in rx for 50 unit dosing. Please verify she is on that dose. Keep follow up as scheduled.

## 2014-12-02 NOTE — Telephone Encounter (Signed)
Pharm sent fax stating pt reports she is using 50 units Qhs now and need new Rx for this amount. Dr Neva SeatGreene, you ov notes say to inc to 32 units and then 34, but [facility] that pt said she was inst'd by you to keep inc until fasting BS < 200. Please advise. I will pend for 50 units Qhs

## 2014-12-03 NOTE — Telephone Encounter (Signed)
Spoke with pt, she does take 50 units. Advised Rx sent to pharmacy.

## 2015-01-23 ENCOUNTER — Ambulatory Visit: Payer: Self-pay | Admitting: Pharmacist

## 2015-01-30 ENCOUNTER — Ambulatory Visit: Payer: Self-pay | Admitting: Pharmacist

## 2015-01-30 ENCOUNTER — Ambulatory Visit (INDEPENDENT_AMBULATORY_CARE_PROVIDER_SITE_OTHER): Payer: 59 | Admitting: Family Medicine

## 2015-01-30 VITALS — BP 138/74 | Ht 65.0 in | Wt 270.0 lb

## 2015-01-30 DIAGNOSIS — E119 Type 2 diabetes mellitus without complications: Secondary | ICD-10-CM | POA: Diagnosis not present

## 2015-01-30 LAB — POCT GLYCOSYLATED HEMOGLOBIN (HGB A1C): HEMOGLOBIN A1C: 7.4

## 2015-01-30 NOTE — Patient Instructions (Addendum)
1)  Continue to exercise at least three times per week 2)  Continue to test at least once daily, great job with this 3)  [place] job with diet, continue making improvements, specifically when eating out 4)  Follow-up in 3 months on  Thursday 05/01/15 @ 9:30 am  Great to see you today!

## 2015-01-30 NOTE — Progress Notes (Signed)
Subjective:  Patient presents today for 3 month diabetes follow-up as part of the employer-sponsored Link to Wellness program.  Current diabetes regimen includes metformin, Januvia, and Lantus.  Patient also continues on daily ASA, ACE Inhibitor and statin.  Most recent MD follow-up was Jan 2016.  Patient is due for 3 mo follow-up in April 2016, will schedule appt soon.  No med changes or major health changes at this time.    Assessment/Plan:  Patient is a 39 yo female with DM type 2. Most recent A1C was 7.4% which is above goal of less than 7%.  A1c is significantly improved vs previous A1c of 8.1%.  Weight has increased since last visit.  Patient has made significant improvement in medication compliance and reports rarely missing a dose.  This is confirmed by improvements in refill history.  She is now testing at least once daily, and reports fasting glucose has improved significantly and she is no longer having readings >200, most readings 120-140s.  Occasionally lower.  Eye and dental exams up to date.  Patient denies symptoms of neuropathy.  Of note, patient has had two severe hypoglycemic episodes over the past 6 months.  Most recently she experienced syncope due to hyopglycemia while at work.  She reports having a light supper the night before, being very busy at work the next morning causing her to be late for breakfast.  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  She regained consciousness quickly, was able to eat/drink, and has had not further issues.        Lifestyle improvements:  Physical Activity- Three days per week walking dogs and walking with grandson.  Patient is also very active on the job.   Nutrition-  Continues limiting carbs, and attempting to make healthier choices.  Patient has added a breakfast bar on the way to work as a result of recent hypoglycemic episode.  She also eats a regular breakfast mid  morning.  She is eating a lot of fresh fruit, specifically with breakfast.  Smoking Cessation - patient was able to quit in the past with help of NRT and individual counseling program.  She resumed smoking due to stress.  She is now smoking some days of the week, a max of 3 cig on those days.  This is a huge improvement vs >1 ppd as in the past.  However, patient is aware of the need to quit 100%.  She was in tears today describing her struggle with stress and emotions and the fact that smoking helps ease her anxiety.  She will continue to work toward being 100% smoke-free.      Goals for Next Visit: 1)  Continue to exercise at least three times per week 2)  Continue to test at least once daily, great job with this 3)  [place] with diet, continue making improvements, specifically when eating out 4)  Follow-up in 3 months on  Thursday 05/01/15 @ 9:30 am   Orlin HildingElizabeth Vrishank Moster, PharmD [facility]

## 2015-02-04 NOTE — Progress Notes (Signed)
Patient ID: [name], female   DOB: 07/03/1976, 39 y.o.   MRN: [id number] ATTENDING PHYSICIAN NOTE: I have reviewed the chart and agree with the plan as detailed above. Denny LevySara Neal MD Pager [phone]

## 2015-02-13 ENCOUNTER — Ambulatory Visit (INDEPENDENT_AMBULATORY_CARE_PROVIDER_SITE_OTHER): Payer: 59 | Admitting: Emergency Medicine

## 2015-02-13 VITALS — BP 114/72 | HR 81 | Temp 99.0°F | Resp 20 | Ht 67.0 in | Wt 272.0 lb

## 2015-02-13 DIAGNOSIS — E119 Type 2 diabetes mellitus without complications: Secondary | ICD-10-CM | POA: Diagnosis not present

## 2015-02-13 DIAGNOSIS — G4733 Obstructive sleep apnea (adult) (pediatric): Secondary | ICD-10-CM

## 2015-02-13 LAB — POCT GLYCOSYLATED HEMOGLOBIN (HGB A1C): HEMOGLOBIN A1C: 7.1

## 2015-02-13 NOTE — Progress Notes (Addendum)
[facility] [address], [place] [address] [phone]  Date:  02/13/2015   Name:  Renee Ho   DOB:  09/20/1976   MRN:  [id number]  PCP:  Shade FloodGREENE,JEFFREY R, MD    Chief Complaint: Diabetes; cpap; and Depression   History of Present Illness:  Renee Ho is a 39 y.o. very pleasant female patient who presents with the following:  Patient has severe OSA and her machine is not functioning properly as it is 41 years old Mask and tubing is three years old Needs HBA1C Last home test was 158 Denies other complaint or health concern today.   Patient Active Problem List   Diagnosis Date Noted  . Injury of foot, left 07/18/2013  . Dysuria 05/22/2013  . Preventive measure 05/22/2013  . HTN (hypertension) 09/18/2012  . Morbid obesity 09/18/2012  . S/P hysterectomy 06/20/2012  . H/O colonoscopy with polypectomy 06/20/2012  . Thrush, oral 06/19/2012  . Diverticulitis 06/13/2012  . Depression 12/30/2011  . Plantar fasciitis 04/22/2011  . HYPERTRIGLYCERIDEMIA 02/21/2008  . DIABETES MELLITUS, TYPE II 02/24/2007  . POLYCYSTIC OVARIAN DISEASE 02/24/2007  . HYPERTENSION 02/24/2007  . SLEEP APNEA on CPAP 02/24/2007    Past Medical History  Diagnosis Date  . Diabetes mellitus   . Diverticula of intestine   . High cholesterol   . Sleep apnea   . Diabetes mellitus without complication   . Hypertension   . Polycystic ovarian syndrome     Past Surgical History  Procedure Laterality Date  . Ovarian cyst removal    . Knee arthroscopy  07/11/2012    Procedure: ARTHROSCOPY KNEE;  Surgeon: Cammy CopaGregory Scott Dean, MD;  [facility];  Service: Orthopedics;  Laterality: Right;  Right knee arthroscopy with debridement  . Abdominal hysterectomy    . Cyst removal neck    . Knee surgery      History  Substance Use Topics  . Smoking status: Current Some Day Smoker -- 0.50 packs/day  . Smokeless tobacco: Not on file  . Alcohol Use: No     Comment:  Rarely.    Family History  Problem Relation Age of Onset  . Cancer Mother   . Mental illness Mother     Allergies  Allergen Reactions  . Metoclopramide Hcl     REACTION: Psychosis  . Morphine Other (See Comments)    migraine  . Morphine And Related Other (See Comments)    Migraines  . Reglan [Metoclopramide] Other (See Comments)    Psychotic episodes  . Toradol [Ketorolac Tromethamine] Rash    Medication list has been reviewed and updated.  Current Outpatient Prescriptions on File Prior to Visit  Medication Sig Dispense Refill  . aspirin EC 81 MG tablet Take 81 mg by mouth daily.    [name]. atorvastatin (LIPITOR) 10 MG tablet Take 1 tablet (10 mg total) by mouth daily. 90 tablet 1  . DULoxetine (CYMBALTA) 60 MG capsule TAKE 1 CAPSULE BY MOUTH DAILY. 90 capsule 1  . hydrochlorothiazide (HYDRODIURIL) 25 MG tablet TAKE 1 TABLET BY MOUTH DAILY. 90 tablet 1  . insulin glargine (LANTUS) 100 UNIT/ML injection Inject 0.5 mLs (50 Units total) into the skin at bedtime. 20 mL 3  . JANUVIA 100 MG tablet TAKE 1 TABLET BY MOUTH DAILY. 90 tablet 1  . lisinopril (PRINIVIL,ZESTRIL) 5 MG tablet TAKE 1 TABLET BY MOUTH ONCE DAILY 90 tablet 1  . metFORMIN (GLUCOPHAGE) 1000 MG tablet Take 1 tablet (1,000 mg total) by  mouth 2 (two) times daily with a meal. 180 tablet 1   No current facility-administered medications on file prior to visit.    Review of Systems:  Review of Systems  Constitutional: Negative for fever, chills and fatigue.  HENT: Negative for congestion, ear pain, hearing loss, postnasal drip, rhinorrhea and sinus pressure.   Eyes: Negative for discharge and redness.  Respiratory: Negative for cough, shortness of breath and wheezing.   Cardiovascular: Negative for chest pain and leg swelling.  Gastrointestinal: Negative for nausea, vomiting, abdominal pain, constipation and blood in stool.  Genitourinary: Negative for dysuria, urgency and frequency.  Musculoskeletal: Negative for neck  stiffness.  Skin: Negative for rash.  Neurological: Negative for seizures, weakness and headaches.   Physical Examination: Filed Vitals:   02/13/15 1731  BP: 114/72  Pulse: 81  Temp: 99 F (37.2 C)  Resp: 20   Filed Vitals:   02/13/15 1731  Height: 5\' 7"  (1.702 m)  Weight: 272 lb (123.378 kg)   Body mass index is 42.59 kg/(m^2). Ideal Body Weight: Weight in (lb) to have BMI = 25: 159.3   GEN: morbid obesity, NAD, Non-toxic, Alert & Oriented x 3 HEENT: Atraumatic, Normocephalic.  Ears and Nose: No external deformity. EXTR: No clubbing/cyanosis/edema NEURO: Normal gait.  PSYCH: Normally interactive. Conversant. Not depressed or anxious appearing.  Calm demeanor.    Assessment and Plan: DM2 OSA Needs prescription for new CPAP Follow up with Dr Neva SeatGreene  Signed Phillips OdorJeffery Bassam Dresch, MD    Results for orders placed or performed in visit on 02/13/15  Lipid panel  Result Value Ref Range   Cholesterol 146 0 - 200 mg/dL   Triglycerides 409219 (H) <150 mg/dL   HDL 27 (L) >=81>=46 mg/dL   Total CHOL/HDL Ratio 5.4 Ratio   VLDL 44 (H) 0 - 40 mg/dL   LDL Cholesterol 75 0 - 99 mg/dL  Comprehensive metabolic panel  Result Value Ref Range   Sodium 138 135 - 145 mEq/L   Potassium 4.1 3.5 - 5.3 mEq/L   Chloride 103 96 - 112 mEq/L   CO2 28 19 - 32 mEq/L   Glucose, Bld 123 (H) 70 - 99 mg/dL   BUN 11 6 - 23 mg/dL   Creat 1.910.77 4.780.50 - 2.951.10 mg/dL   Total Bilirubin 0.5 0.2 - 1.2 mg/dL   Alkaline Phosphatase 47 39 - 117 U/L   AST 20 0 - 37 U/L   ALT 14 0 - 35 U/L   Total Protein 7.5 6.0 - 8.3 g/dL   Albumin 4.6 3.5 - 5.2 g/dL   Calcium 9.7 8.4 - 62.110.5 mg/dL  POCT glycosylated hemoglobin (Hb A1C)  Result Value Ref Range   Hemoglobin A1C 7.1

## 2015-02-13 NOTE — Patient Instructions (Signed)
Sleep Apnea  Sleep apnea is a sleep disorder characterized by abnormal pauses in breathing while you sleep. When your breathing pauses, the level of oxygen in your blood decreases. This causes you to move out of deep sleep and into light sleep. As a result, your quality of sleep is poor, and the system that carries your blood throughout your body (cardiovascular system) experiences stress. If sleep apnea remains untreated, the following conditions can develop:  High blood pressure (hypertension).  Coronary artery disease.  Inability to achieve or maintain an erection (impotence).  Impairment of your thought process (cognitive dysfunction). There are three types of sleep apnea: 1. Obstructive sleep apnea--Pauses in breathing during sleep because of a blocked airway. 2. Central sleep apnea--Pauses in breathing during sleep because the area of the brain that controls your breathing does not send the correct signals to the muscles that control breathing. 3. Mixed sleep apnea--A combination of both obstructive and central sleep apnea. RISK FACTORS The following risk factors can increase your risk of developing sleep apnea:  Being overweight.  Smoking.  Having narrow passages in your nose and throat.  Being of older age.  Being female.  Alcohol use.  Sedative and tranquilizer use.  Ethnicity. Among individuals younger than 35 years, African Americans are at increased risk of sleep apnea. SYMPTOMS   Difficulty staying asleep.  Daytime sleepiness and fatigue.  Loss of energy.  Irritability.  Loud, heavy snoring.  Morning headaches.  Trouble concentrating.  Forgetfulness.  Decreased interest in sex. DIAGNOSIS  In order to diagnose sleep apnea, your caregiver will perform a physical examination. Your caregiver may suggest that you take a home sleep test. Your caregiver may also recommend that you spend the night in a sleep lab. In the sleep lab, several monitors record  information about your heart, lungs, and brain while you sleep. Your leg and arm movements and blood oxygen level are also recorded. TREATMENT The following actions may help to resolve mild sleep apnea:  Sleeping on your side.   Using a decongestant if you have nasal congestion.   Avoiding the use of depressants, including alcohol, sedatives, and narcotics.   Losing weight and modifying your diet if you are overweight. There also are devices and treatments to help open your airway:  Oral appliances. These are custom-made mouthpieces that shift your lower jaw forward and slightly open your bite. This opens your airway.  Devices that create positive airway pressure. This positive pressure "splints" your airway open to help you breathe better during sleep. The following devices create positive airway pressure:  Continuous positive airway pressure (CPAP) device. The CPAP device creates a continuous level of air pressure with an air pump. The air is delivered to your airway through a mask while you sleep. This continuous pressure keeps your airway open.  Nasal expiratory positive airway pressure (EPAP) device. The EPAP device creates positive air pressure as you exhale. The device consists of single-use valves, which are inserted into each nostril and held in place by adhesive. The valves create very little resistance when you inhale but create much more resistance when you exhale. That increased resistance creates the positive airway pressure. This positive pressure while you exhale keeps your airway open, making it easier to breath when you inhale again.  Bilevel positive airway pressure (BPAP) device. The BPAP device is used mainly in patients with central sleep apnea. This device is similar to the CPAP device because it also uses an air pump to deliver continuous air pressure   through a mask. However, with the BPAP machine, the pressure is set at two different levels. The pressure when you  exhale is lower than the pressure when you inhale.  Surgery. Typically, surgery is only done if you cannot comply with less invasive treatments or if the less invasive treatments do not improve your condition. Surgery involves removing excess tissue in your airway to create a wider passage way. Document Released: 09/17/2002 Document Revised: 01/22/2013 Document Reviewed: 02/03/2012 Exit[employer] Patient Information 2015 [employer]. This information is not intended to replace advice given to you by your health care provider. Make sure you discuss any questions you have with your health care provider.  

## 2015-02-14 LAB — COMPREHENSIVE METABOLIC PANEL
ALBUMIN: 4.6 g/dL (ref 3.5–5.2)
ALK PHOS: 47 U/L (ref 39–117)
ALT: 14 U/L (ref 0–35)
AST: 20 U/L (ref 0–37)
BILIRUBIN TOTAL: 0.5 mg/dL (ref 0.2–1.2)
BUN: 11 mg/dL (ref 6–23)
CALCIUM: 9.7 mg/dL (ref 8.4–10.5)
CO2: 28 mEq/L (ref 19–32)
Chloride: 103 mEq/L (ref 96–112)
Creat: 0.77 mg/dL (ref 0.50–1.10)
GLUCOSE: 123 mg/dL — AB (ref 70–99)
Potassium: 4.1 mEq/L (ref 3.5–5.3)
Sodium: 138 mEq/L (ref 135–145)
TOTAL PROTEIN: 7.5 g/dL (ref 6.0–8.3)

## 2015-02-14 LAB — LIPID PANEL
CHOL/HDL RATIO: 5.4 ratio
CHOLESTEROL: 146 mg/dL (ref 0–200)
HDL: 27 mg/dL — AB (ref >=46)
LDL CALC: 75 mg/dL (ref 0–99)
TRIGLYCERIDES: 219 mg/dL — AB (ref <150)
VLDL: 44 mg/dL — AB (ref 0–40)

## 2015-02-14 NOTE — Addendum Note (Signed)
Addended by: Carmelina DaneANDERSON, Kadden Osterhout S on: 02/14/2015 04:41 PM   Modules accepted: Level of Service

## 2015-03-07 ENCOUNTER — Telehealth: Payer: Self-pay

## 2015-03-07 NOTE — Telephone Encounter (Signed)
Dr Dareen PianoAnderson- Can you help with this? Don't know how to handle this.

## 2015-03-07 NOTE — Telephone Encounter (Signed)
She should be under the care of a neurologist or pulmonary guy for his sleep apnea.   We don't manage CPAP and his doc that is caring him will have to ORDER the change so the agency that provided his machine can adjust the settings.  This is the first time I have heard about this.

## 2015-03-07 NOTE — Telephone Encounter (Signed)
Needs to add 15 pressure for CPAP machine - patient has been waiting all month.    States she left detailed message last week.   [phone]

## 2015-03-11 NOTE — Telephone Encounter (Signed)
Pt.notified

## 2015-05-01 ENCOUNTER — Ambulatory Visit: Payer: Self-pay | Admitting: Pharmacist

## 2015-05-06 ENCOUNTER — Other Ambulatory Visit: Payer: Self-pay | Admitting: Family Medicine

## 2015-05-06 ENCOUNTER — Ambulatory Visit: Payer: Self-pay | Admitting: Pharmacist

## 2015-05-06 ENCOUNTER — Ambulatory Visit (INDEPENDENT_AMBULATORY_CARE_PROVIDER_SITE_OTHER): Payer: Self-pay | Admitting: Family Medicine

## 2015-05-06 VITALS — BP 122/68 | Ht 65.0 in | Wt 267.0 lb

## 2015-05-06 DIAGNOSIS — E119 Type 2 diabetes mellitus without complications: Secondary | ICD-10-CM

## 2015-05-06 NOTE — Patient Instructions (Addendum)
1)  Resume exercising at least three times per week 2)  Continue to test at least once daily, great job with this 3)  [place] job with diet, continue making improvements, specifically when eating out 4)  Follow-up in 3 months on Tuesday October 25th @ 11:00 am   Great to see you today!!

## 2015-05-06 NOTE — Progress Notes (Signed)
Subjective:  Patient presents today for 3 month diabetes follow-up as part of the employer-sponsored Link to Wellness program.  Current diabetes regimen includes metformin, Januvia, and Lantus.  Patient also continues on daily ASA, ACE Inhibitor and statin.  Most recent MD follow-up was May 2016.  Patient does not have a follow-up scheduled at this time but was told to schedule as needed.  No med changes or major health changes at this time.    Assessment:  Patient is a 39 yo female with DM type 2. Most recent A1C was 7.1% (prev 7.4) which is above goal of less than 7%.  A1c is improved vs previous A1c of 7.4%.  Weight has remained the same since last visit.  Patient has made significant improvement in medication compliance and reports rarely missing a dose of her diabetes medications.  She continues to need improvement in compliance with other meds.  She is now testing at least once daily, and reports fasting glucose has improved significantly and she is no longer having readings >200.  No hypoglycemia.   Eye and dental exams up to date.  Patient denies symptoms of neuropathy.    Lifestyle improvements:  Physical Activity-  No major changes at this time.  She is not walking as much this time of year due to the heat.  She has been very active helping her daughter move and remodel their home.  She also remains active on her job.      Nutrition-  Continues limiting carbs, and attempting to make healthier choices.  Patient has added a breakfast bar on the way to work as a result of recent hypoglycemic episode.  She also eats a regular breakfast mid morning.  She is eating a lot of fresh fruit, specifically with breakfast and is now having salads more often.    Smoking Cessation - patient was able to quit in the past with help of NRT and individual counseling program.  She resumed smoking due to stress.  She is now smoking daily and has increased to ~10 cig/day.  Patient is aware of the need to quit.  She  was in tears today describing her struggle with stress and emotions and the fact that smoking helps ease her anxiety.  She will continue to work toward being 100% smoke-free but is not ready to make any changes at this time.     Goals for Next Visit: 1)  Resume exercising at least three times per week 2)  Continue to test at least once daily, great job with this 3)  [place] job with diet, continue making improvements, specifically when eating out 4)  Follow-up in 3 months on Tuesday October 25th @ 11:00 am    Orlin Hilding, PharmD [facility]

## 2015-05-12 NOTE — Progress Notes (Signed)
Patient ID: Renee Ho, female   DOB: 07/13/1976, 39 y.o.   MRN: [id number] ATTENDING PHYSICIAN NOTE: I have reviewed the chart and agree with the plan as detailed above. Duvid Smalls MD Pager [phone]  

## 2015-06-06 ENCOUNTER — Other Ambulatory Visit: Payer: Self-pay | Admitting: Family Medicine

## 2015-06-18 ENCOUNTER — Ambulatory Visit (INDEPENDENT_AMBULATORY_CARE_PROVIDER_SITE_OTHER): Payer: 59 | Admitting: Family Medicine

## 2015-06-18 VITALS — BP 134/82 | HR 87 | Temp 98.8°F | Resp 16 | Ht 65.0 in | Wt 277.0 lb

## 2015-06-18 DIAGNOSIS — E119 Type 2 diabetes mellitus without complications: Secondary | ICD-10-CM | POA: Diagnosis not present

## 2015-06-18 DIAGNOSIS — Z9989 Dependence on other enabling machines and devices: Secondary | ICD-10-CM

## 2015-06-18 DIAGNOSIS — G4733 Obstructive sleep apnea (adult) (pediatric): Secondary | ICD-10-CM

## 2015-06-18 DIAGNOSIS — F329 Major depressive disorder, single episode, unspecified: Secondary | ICD-10-CM

## 2015-06-18 DIAGNOSIS — L600 Ingrowing nail: Secondary | ICD-10-CM | POA: Diagnosis not present

## 2015-06-18 DIAGNOSIS — E785 Hyperlipidemia, unspecified: Secondary | ICD-10-CM | POA: Diagnosis not present

## 2015-06-18 DIAGNOSIS — I10 Essential (primary) hypertension: Secondary | ICD-10-CM

## 2015-06-18 DIAGNOSIS — F32A Depression, unspecified: Secondary | ICD-10-CM

## 2015-06-18 LAB — GLUCOSE, POCT (MANUAL RESULT ENTRY): POC GLUCOSE: 135 mg/dL — AB (ref 70–99)

## 2015-06-18 LAB — POCT GLYCOSYLATED HEMOGLOBIN (HGB A1C): HEMOGLOBIN A1C: 7.1

## 2015-06-18 MED ORDER — INSULIN GLARGINE 100 UNIT/ML ~~LOC~~ SOLN: SUBCUTANEOUS | Status: DC

## 2015-06-18 MED ORDER — LISINOPRIL 5 MG PO TABS: ORAL_TABLET | ORAL | Status: DC

## 2015-06-18 MED ORDER — METFORMIN HCL 1000 MG PO TABS: ORAL_TABLET | ORAL | Status: DC

## 2015-06-18 MED ORDER — HYDROCHLOROTHIAZIDE 25 MG PO TABS: ORAL_TABLET | ORAL | Status: DC

## 2015-06-18 MED ORDER — ATORVASTATIN CALCIUM 10 MG PO TABS: 10.0000 mg | ORAL_TABLET | Freq: Every day | ORAL | Status: DC

## 2015-06-18 MED ORDER — JANUVIA 100 MG PO TABS: ORAL_TABLET | ORAL | Status: DC

## 2015-06-18 MED ORDER — DULOXETINE HCL 60 MG PO CPEP: ORAL_CAPSULE | ORAL | Status: DC

## 2015-06-18 NOTE — Patient Instructions (Signed)
No change in diabetes meds at this time. Continue good work on diet, and we should see some continued improvement in your A1c. Try to allow the outside of that toenail to come up and over the skin. If you're having continued pain over the next week to 2 weeks, or swelling or redness in that area, return for [place] to evaluate for possible excision of that toenail. Return to the clinic or go to the nearest emergency room if any of your symptoms worsen or new symptoms occur.  Plan on other lab work at follow-up in next 3 months.  I will refer you to sleep specialist to evaluate your CPAP.  Return to the clinic or go to the nearest emergency room if any of your symptoms worsen or new symptoms occur.

## 2015-06-18 NOTE — Progress Notes (Addendum)
Subjective:  This chart was scribed for [name], MD by Andrew Au, ED Scribe. This patient was seen in room 12 and the patient's care was started at 8:09 PM.  Patient ID: Renee Ho, female    DOB: [birth year], 39 y.o.   MRN: [id number]  HPI   Chief Complaint  Patient presents with  . Depression    depression screen  . Medication Refill    ALL meds  . Follow-up    A1C  . Toe Pain    right 3rd toe x 1 month  . Referral    needs sleep study    HPI Comments: Renee Ho is a 39 y.o. female who presents to the [facility] for multiple concerns. She is here for refill of all medications and would like discuss toe pain and a referral for a sleep study. Last seen by me in January 2016 and had a f/u with Dr. Dareen Piano.   Diabetes  Lab Results  Component Value Date   MICROALBUR 0.51 05/03/2014    Lab Results  Component Value Date   HGBA1C 7.1 02/13/2015   She is on Metformin 100 mg, Januvia 100 mg, Lantus 50 units at bed time. She is compliant with all her medication. She has not had hypoglycemic episodes within the last 3 months. She is still followed by the pharmacist. She denies retinopathy.    Hypertension   Takes HCTZ 25 mg qd and lisinopril 5 mg. She states outside reading with pharmacist has been normal  Depression   She is on Cymbalta 60 mg qd. She has been out of Cymbalta for 5 days. She is tolerating medication well and denies SI/HI. She is requesting intermittent FMLA due to hypoglycemia events in the past. Last episode was in June while at work and sent to the ED.   Depression screen [facility] 2/9 06/18/2015 02/13/2015 07/18/2013 05/22/2013 09/18/2012  Decreased Interest 1 1 0 1 0  Down, Depressed, Hopeless 0  PHQ - 2 Score 0  Altered sleeping 1 3 - 1 -  Tired, decreased energy 1 3 - 1 -  Change in appetite 0 0 - 1 -  Feeling bad or failure about yourself  0 1 - 0 -  Trouble concentrating 1 0 - 1 -  Moving slowly or  fidgety/restless 1 0 - 0 -  Suicidal thoughts 0 0 - 0 -  PHQ-9 Score 6 11 - 6 -  Difficult doing work/chores Extremely dIfficult - - - -   Hyperlipidemia  She takes Lipitor 10 mg. She is tolerating medication well.    Lab Results  Component Value Date   ALT 14 02/13/2015   AST 20 02/13/2015   ALKPHOS 47 02/13/2015   BILITOT 0.5 02/13/2015    Lab Results  Component Value Date   CHOL 146 02/13/2015   HDL 27* 02/13/2015   LDLCALC 75 02/13/2015   LDLDIRECT 129* 05/22/2013   TRIG 219* 02/13/2015   CHOLHDL 5.4 02/13/2015   Obstructive sleep apnea She has been on CPAP for 6+ years. She has not had an eval in 6 years. States her CPAP has been shutting off during the night due to sensory issues.   Toe pain Pt c/o right 3rd toe pain for 1 month. She has applied neosporin without relief. She has been cutting toe nail.   Patient Active Problem List   Diagnosis Date Noted  . Injury of foot, left 07/18/2013  .  Dysuria 05/22/2013  . Preventive measure 05/22/2013  . HTN (hypertension) 09/18/2012  . Morbid obesity 09/18/2012  . S/P hysterectomy 06/20/2012  . H/O colonoscopy with polypectomy 06/20/2012  . Thrush, oral 06/19/2012  . Diverticulitis 06/13/2012  . Depression 12/30/2011  . Plantar fasciitis 04/22/2011  . HYPERTRIGLYCERIDEMIA 02/21/2008  . DIABETES MELLITUS, TYPE II 02/24/2007  . POLYCYSTIC OVARIAN DISEASE 02/24/2007  . HYPERTENSION 02/24/2007  . SLEEP APNEA on CPAP 02/24/2007   Past Medical History  Diagnosis Date  . Diabetes mellitus   . Diverticula of intestine   . High cholesterol   . Sleep apnea   . Diabetes mellitus without complication   . Hypertension   . Polycystic ovarian syndrome    Past Surgical History  Procedure Laterality Date  . Ovarian cyst removal    . Knee arthroscopy  07/11/2012    Procedure: ARTHROSCOPY KNEE;  Surgeon: Cammy Copa, MD;  Location: [facility] OR;  Service: Orthopedics;  Laterality: Right;  Right knee arthroscopy with  debridement  . Abdominal hysterectomy    . Cyst removal neck    . Knee surgery     Allergies  Allergen Reactions  . Metoclopramide Hcl     REACTION: Psychosis  . Morphine Other (See Comments)    migraine  . Morphine And Related Other (See Comments)    Migraines  . Reglan [Metoclopramide] Other (See Comments)    Psychotic episodes  . Toradol [Ketorolac Tromethamine] Rash   Prior to Admission medications   Medication Sig Start Date End Date Taking? Authorizing Provider  aspirin EC 81 MG tablet Take 81 mg by mouth daily.   Yes Historical Provider, MD  atorvastatin (LIPITOR) 10 MG tablet Take 1 tablet (10 mg total) by mouth daily. 10/16/14  Yes Shade Flood, MD  DULoxetine (CYMBALTA) 60 MG capsule TAKE 1 CAPSULE BY MOUTH DAILY.  "OV NEEDED" 05/07/15  Yes Chelle Jeffery, PA-C  hydrochlorothiazide (HYDRODIURIL) 25 MG tablet TAKE 1 TABLET BY MOUTH DAILY. 10/16/14  Yes Shade Flood, MD  insulin glargine (LANTUS) 100 UNIT/ML injection Inject 50 units into the skin at bedtime. PATIENT NEEDS OFFICE VISIT FOR ADDITIONAL REFILLS 06/09/15  Yes Carmelina Dane, MD  JANUVIA 100 MG tablet TAKE 1 TABLET BY MOUTH DAILY. 10/16/14  Yes Shade Flood, MD  lisinopril (PRINIVIL,ZESTRIL) 5 MG tablet TAKE 1 TABLET BY MOUTH ONCE DAILY 10/16/14  Yes Shade Flood, MD  metFORMIN (GLUCOPHAGE) 1000 MG tablet TAKE 1 TABLET (1,000 MG TOTAL) BY MOUTH 2 (TWO) TIMES DAILY WITH A MEAL.  "OV NEEDED" 05/07/15  Yes Porfirio Oar, PA-C   Social History   Social History  . Marital Status: Single    Spouse Name: N/A  . Number of Children: N/A  . Years of Education: N/A   Occupational History  . Not on file.   Social History Main Topics  . Smoking status: Current Every Day Smoker -- 0.50 packs/day  . Smokeless tobacco: Not on file  . Alcohol Use: No     Comment: Rarely.  . Drug Use: No  . Sexual Activity: Not on file   Other Topics Concern  . Not on file   Social History Narrative   ** Merged History  Encounter **       Review of Systems  Constitutional: Negative for fatigue and unexpected weight change.  Respiratory: Negative for chest tightness and shortness of breath.   Cardiovascular: Negative for chest pain, palpitations and leg swelling.  Gastrointestinal: Negative for abdominal pain and blood in stool.  Neurological: Negative for dizziness, syncope, light-headedness and headaches.    Objective:  Physical Exam  Constitutional: She is oriented to person, place, and time. She appears well-developed and well-nourished.  HENT:  Head: Normocephalic and atraumatic.  Eyes: Conjunctivae and EOM are normal. Pupils are equal, round, and reactive to light.  Neck: Carotid bruit is not present.  Cardiovascular: Normal rate, regular rhythm, normal heart sounds and intact distal pulses.   Pulmonary/Chest: Effort normal and breath sounds normal.  Abdominal: Soft. She exhibits no pulsatile midline mass. There is no tenderness.  Neurological: She is alert and oriented to person, place, and time.  Skin: Skin is warm and dry.  Right third nail is shorten/fragmented. Tender at the lateral nail fold but no erythema swell or warmth. No discharge.   Psychiatric: She has a normal mood and affect. Her behavior is normal.  Vitals reviewed.   Filed Vitals:   06/18/15 1956  BP: 134/82  Pulse: 87  Temp: 98.8 F (37.1 C)  TempSrc: Oral  Resp: 16  Height: 5\' 5"  (1.651 m)  Weight: 277 lb (125.646 kg)  SpO2: 98%   Results for orders placed or performed in visit on 06/18/15  POCT glycosylated hemoglobin (Hb A1C)  Result Value Ref Range   Hemoglobin A1C 7.1   POCT glucose (manual entry)  Result Value Ref Range   POC Glucose 135 (A) 70 - 99 mg/dl    Assessment & Plan:   Renee Ho is a 38 y.o. female OSA on CPAP - Plan: Ambulatory referral to Sleep Studies to eval CPAP and settings.  Depression - Plan: DULoxetine (CYMBALTA) 60 MG capsule  -Stable on meds, increased crying spells that  she been off for a few days. Restart Cymbalta same dose, discuss further at future visit.  Type 2 diabetes mellitus without complication - Plan: POCT glycosylated hemoglobin (Hb A1C), POCT glucose (manual entry), insulin glargine (LANTUS) 100 UNIT/ML injection, JANUVIA 100 MG tablet, metFORMIN (GLUCOPHAGE) 1000 MG tablet  -Borderline control, but has had hypoglycemia in the past. Continue same doses of medications and continue work with diet. No recent hypoglycemia this is now eating breakfast in the morning. She may experience this again in the future, so if FMLA paperwork needed to cover those rare episodes, she plans to bring this for me to complete.  Essential hypertension - Plan: hydrochlorothiazide (HYDRODIURIL) 25 MG tablet, lisinopril (PRINIVIL,ZESTRIL) 5 MG tablet  -Stable. No changes  Hyperlipidemia - Plan: atorvastatin (LIPITOR) 10 MG tablet  -Last lipids in May. Plan on rechecking these at next visit with fasting labs. Continue Lipitor same dose.  Ingrown toenail  -Minimal symptoms, no surrounding erythema or swelling. Can try to place some gauze underneath nail allowed to grow up now, but if it is worsening over the next 2 weeks, return for possible wedge excision if it is not growing up and over where it needs to be. RTC precautions  Meds ordered this encounter  Medications  . atorvastatin (LIPITOR) 10 MG tablet    Sig: Take 1 tablet (10 mg total) by mouth daily.    Dispense:  90 tablet    Refill:  1  . DULoxetine (CYMBALTA) 60 MG capsule    Sig: TAKE 1 CAPSULE BY MOUTH DAILY.    Dispense:  90 capsule    Refill:  1  . hydrochlorothiazide (HYDRODIURIL) 25 MG tablet    Sig: TAKE 1 TABLET BY MOUTH DAILY.    Dispense:  90 tablet    Refill:  1  . insulin  glargine (LANTUS) 100 UNIT/ML injection    Sig: Inject 50 units into the skin at bedtime.    Dispense:  20 mL    Refill:  3  . JANUVIA 100 MG tablet    Sig: TAKE 1 TABLET BY MOUTH DAILY.    Dispense:  90 tablet    Refill:   1  . lisinopril (PRINIVIL,ZESTRIL) 5 MG tablet    Sig: TAKE 1 TABLET BY MOUTH ONCE DAILY    Dispense:  90 tablet    Refill:  1  . metFORMIN (GLUCOPHAGE) 1000 MG tablet    Sig: TAKE 1 TABLET (1,000 MG TOTAL) BY MOUTH 2 (TWO) TIMES DAILY WITH A MEAL.    Dispense:  180 tablet    Refill:  1   Patient Instructions  No change in diabetes meds at this time. Continue good work on diet, and we should see some continued improvement in your A1c. Try to allow the outside of that toenail to come up and over the skin. If you're having continued pain over the next week to 2 weeks, or swelling or redness in that area, return for [place] to evaluate for possible excision of that toenail. Return to the clinic or go to the nearest emergency room if any of your symptoms worsen or new symptoms occur.  Plan on other lab work at follow-up in next 3 months.  I will refer you to sleep specialist to evaluate your CPAP.  Return to the clinic or go to the nearest emergency room if any of your symptoms worsen or new symptoms occur.      I personally performed the services described in this documentation, which was scribed in my presence. The recorded information has been reviewed and considered, and addended by me as needed.

## 2015-06-24 ENCOUNTER — Ambulatory Visit (INDEPENDENT_AMBULATORY_CARE_PROVIDER_SITE_OTHER): Payer: 59 | Admitting: Neurology

## 2015-06-24 ENCOUNTER — Encounter: Payer: Self-pay | Admitting: Neurology

## 2015-06-24 VITALS — BP 112/60 | HR 72 | Resp 18 | Ht 65.0 in | Wt 268.0 lb

## 2015-06-24 DIAGNOSIS — G4733 Obstructive sleep apnea (adult) (pediatric): Secondary | ICD-10-CM | POA: Diagnosis not present

## 2015-06-24 DIAGNOSIS — R351 Nocturia: Secondary | ICD-10-CM

## 2015-06-24 DIAGNOSIS — R51 Headache: Secondary | ICD-10-CM | POA: Diagnosis not present

## 2015-06-24 DIAGNOSIS — G2581 Restless legs syndrome: Secondary | ICD-10-CM

## 2015-06-24 DIAGNOSIS — R519 Headache, unspecified: Secondary | ICD-10-CM

## 2015-06-24 DIAGNOSIS — G4719 Other hypersomnia: Secondary | ICD-10-CM

## 2015-06-24 NOTE — Progress Notes (Signed)
Subjective:    Patient ID: Renee Ho is a 39 y.o. female.  HPI     Huston Foley, MD, PhD [facility] [address] [address] [place], [place] [address]  Dear Dr. Neva Seat,   I saw your patient, Renee Ho, upon your kind request in my neurologic clinic today for initial consultation of her sleep disorder, in particular re-evaluation of her obstructive sleep apnea. The patient is unaccompanied today. As you know, [name] is a 39 year old right-handed woman with an underlying medical history of hypertension, morbid obesity, diverticulitis, depression, plantar fasciitis, hyper lipidemia, type 2 diabetes, PCOS, and smoking, who was diagnosed with obstructive sleep apnea several years ago, probably over 6 years ago and has been on CPAP treatment. She reports compliance with treatment but she has not had a reevaluation in over 6 years and her machine is not working properly. Prior sleep test results were reviewed, today, where available. It looks like she had a CPAP titration study on 09/17/2010, interpreted by Dr. Jetty Duhamel: Sleep efficiency was 97.2%, REM latency 75.5 minutes, CPAP was titrated to 15 cm with AHI of 0 per hour. She had a large comfort gel fullface mask. Average oxygen saturation was 94.8%. She was apparently diagnosed with severe obstructive sleep apnea in 2005. Her study results from 2011 mention a previous RDI from 08/31/2004 at 178.5 per hour and CPAP titration to 11 cm. She weighed 280 pounds at the time of her original diagnosis and her weight during the sleep study in 2011 was 278. I reviewed your office note from 06/18/2015.   She reports morning HAs for the past 2 years, about 2-3 times per week, dull, achy and bitemporal. She goes to bed around 10 PM and uses her CPAP regularly. She has not had a change in her mask in years and the machine is old intense to shut off in the early morning hours. She wakes up gasping for air and has  residual snoring through the machine. She reports nocturia once on an average night. She has some restless leg symptoms and feels and comfortable sensation in her legs but denies any numbness or tingling or history of diabetic neuropathy. She drinks alcohol very rarely, perhaps once a year and smokes half pack per day on average, she drinks caffeine occasionally, not daily. 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 Her rise time is usually 6 AM. She has excessive daytime somnolence and does not feel rested. Her Epworth sleepiness score is 17 out of 24, her fatigue score is 58 out of 63. There is no official diagnosis of sleep apnea in her family but her father snores and has breathing pauses but has not been tested for OSA. She lives with her fianc. She has 1 child. She reports an improvement in her A1c to 7.1. He used to be around 10.  Her Past Medical History Is Significant For: Past Medical History  Diagnosis Date  . Diabetes mellitus   . Diverticula of intestine   . High cholesterol   . Sleep apnea   . Diabetes mellitus without complication   . Hypertension   . Polycystic ovarian syndrome   . Depression     Her Past Surgical History Is Significant For: Past Surgical History  Procedure Laterality Date  . Ovarian cyst removal    . Knee arthroscopy  07/11/2012    Procedure: ARTHROSCOPY KNEE;  Surgeon: Cammy Copa, MD;  Location: [facility]  OR;  Service: Orthopedics;  Laterality: Right;  Right knee arthroscopy with debridement  . Abdominal hysterectomy    . Cyst removal neck    . Knee surgery      Her Family History Is Significant For: Family History  Problem Relation Age of Onset  . Cancer Mother   . Mental illness Mother   . Cancer Maternal Grandfather   . Stroke Paternal Grandmother     Her Social History Is Significant For: Social History   Social History  . Marital Status: Single    Spouse Name: N/A  . Number of Children: 1  . Years  of Education: [employer]   Social History Main Topics  . Smoking status: Current Every Day Smoker -- 0.50 packs/day  . Smokeless tobacco: None  . Alcohol Use: No     Comment: Rarely.  . Drug Use: No  . Sexual Activity: Not Asked   Other Topics Concern  . None   Social History Narrative   ** Merged History Encounter **   Drinks diet Dr. Reino Kent about 1 a day.         Her Allergies Are:  Allergies  Allergen Reactions  . Metoclopramide Hcl     REACTION: Psychosis  . Morphine Other (See Comments)    migraine  . Morphine And Related Other (See Comments)    Migraines  . Reglan [Metoclopramide] Other (See Comments)    Psychotic episodes  . Toradol [Ketorolac Tromethamine] Rash  :   Her Current Medications Are:  Outpatient Encounter Prescriptions as of 06/24/2015  Medication Sig  . aspirin EC 81 MG tablet Take 81 mg by mouth daily.  [name] atorvastatin (LIPITOR) 10 MG tablet Take 1 tablet (10 mg total) by mouth daily.  . DULoxetine (CYMBALTA) 60 MG capsule TAKE 1 CAPSULE BY MOUTH DAILY.  . hydrochlorothiazide (HYDRODIURIL) 25 MG tablet TAKE 1 TABLET BY MOUTH DAILY.  [name] insulin glargine (LANTUS) 100 UNIT/ML injection Inject 50 units into the skin at bedtime.  [name] JANUVIA 100 MG tablet TAKE 1 TABLET BY MOUTH DAILY.  [name] lisinopril (PRINIVIL,ZESTRIL) 5 MG tablet TAKE 1 TABLET BY MOUTH ONCE DAILY  . metFORMIN (GLUCOPHAGE) 1000 MG tablet TAKE 1 TABLET (1,000 MG TOTAL) BY MOUTH 2 (TWO) TIMES DAILY WITH A MEAL.   No facility-administered encounter medications on file as of 06/24/2015.  :  Review of Systems:  Out of a complete 14 point review of systems, all are reviewed and negative with the exception of these symptoms as listed below:   Review of Systems  Constitutional: Positive for fatigue.  HENT: Positive for tinnitus.   Eyes:       Blurred vision   Respiratory: Positive for shortness of breath.        Snoring   Musculoskeletal:       Cramps, aching muscles   Neurological: Positive  for syncope and headaches.       Sleepiness, snoring, restless legs. Patient currently uses CPAP. Last sleep study was 6-7 years ago. Patient reports that her CPAP machine shuts off during the night. Patient reports daytime sleepiness, wakes up feeling tired in the morning.  Patient has used [facility] in the past for CPAP supplies.   Psychiatric/Behavioral:       Depression, decreased energy, disinterest in activities     Objective:  Neurologic Exam  Physical Exam Physical Examination:   Filed Vitals:   06/24/15 1010  BP: 112/60  Pulse: 72  Resp: 18    General Examination: The patient is a  very pleasant 39 y.o. female in no acute distress. She appears well-developed and well-nourished and adequately groomed. She is morbidly obese.  HEENT: Normocephalic, atraumatic, pupils are equal, round and reactive to light and accommodation. Funduscopic exam is normal with sharp disc margins noted. Extraocular tracking is good without limitation to gaze excursion or nystagmus noted. Normal smooth pursuit is noted. Hearing is grossly intact. Tympanic membranes are clear bilaterally. Face is symmetric with normal facial animation and normal facial sensation. Speech is clear with no dysarthria noted. There is no hypophonia. There is no lip, neck/head, jaw or voice tremor. Neck is supple with full range of passive and active motion. There are no carotid bruits on auscultation. Oropharynx exam reveals: mild mouth dryness, adequate dental hygiene and severe airway crowding, due to larger tongue, thicker soft palate and large uvula, narrow airway entry and tonsils in place which were not fully visualized. Mallampati is class III. Tongue protrudes centrally and palate elevates symmetrically.  neck circumference is 20-7/8 inches.   Chest: Clear to auscultation without wheezing, rhonchi or crackles noted.  Heart: S1+S2+0, regular and normal without murmurs, rubs or gallops noted.   Abdomen: Soft, non-tender and  non-distended with normal bowel sounds appreciated on auscultation.  Extremities: There is trace pitting edema in the distal lower extremities bilaterally. Pedal pulses are intact.  Skin: Warm and dry without trophic changes noted. There are no varicose veins.  Musculoskeletal: exam reveals no obvious joint deformities, tenderness or joint swelling or erythema.   Neurologically:  Mental status: The patient is awake, alert and oriented in all 4 spheres. Her immediate and remote memory, attention, language skills and fund of knowledge are appropriate. There is no evidence of aphasia, agnosia, apraxia or anomia. Speech is clear with normal prosody and enunciation. Thought process is linear. Mood is normal and affect is normal.  Cranial nerves II - XII are as described above under HEENT exam. In addition: shoulder shrug is normal with equal shoulder height noted. Motor exam: Normal bulk, strength and tone is noted. There is no drift, tremor or rebound. Romberg is negative. Reflexes are 2+ throughout. Babinski: Toes are flexor bilaterally. Fine motor skills and coordination: intact with normal finger taps, normal hand movements, normal rapid alternating patting, normal foot taps and normal foot agility.  Cerebellar testing: No dysmetria or intention tremor on finger to nose testing. Heel to shin is unremarkable bilaterally. There is no truncal or gait ataxia.  Sensory exam: intact to light touch, pinprick, vibration, temperature sense in the upper and lower extremities.  Gait, station and balance: She stands easily. No veering to one side is noted. No leaning to one side is noted. Posture is age-appropriate and stance is narrow based. Gait shows normal stride length and normal pace. No problems turning are noted. She turns en bloc. Tandem walk is unremarkable.   Assessment and Plan:   In summary, NILI HONDA is a very pleasant 39 y.o.-year old female with an underlying medical history of  hypertension, morbid obesity, diverticulitis, depression, plantar fasciitis, hyper lipidemia, type 2 diabetes, PCOS, and smoking, who was originally diagnosed with severe obstructive sleep apnea over 10 years ago. She had a re-titration with CPAP about 5 years ago but has not had a change in her mask in years. She reports recurrence of snoring and excessive daytime somnolence which per her report have been going on for a year or 2. She would benefit from reevaluation and adjustment of treatment. She may benefit from bilevel treatment pressures if  her pressure requirement has increased to about 15 cm at this time.  I had a long chat with the patient about my findings and the diagnosis of OSA, its prognosis and treatment options. We talked about medical treatments, surgical interventions and non-pharmacological approaches. I explained in particular the risks and ramifications of untreated moderate to severe OSA, especially with respect to developing cardiovascular disease down the Road, including congestive heart failure, difficult to treat hypertension, cardiac arrhythmias, or stroke. Even type 2 diabetes has, in part, been linked to untreated OSA. Symptoms of untreated OSA include daytime sleepiness, memory problems, mood irritability and mood disorder such as depression and anxiety, lack of energy, as well as recurrent headaches, especially morning headaches. We talked about smoking cessation and trying to maintain a healthy lifestyle in general, as well as the importance of weight control. I encouraged the patient to eat healthy, exercise daily and keep well hydrated, to keep a scheduled bedtime and wake time routine, to not skip any meals and eat healthy snacks in between meals. I advised the patient not to drive when feeling sleepy.  I recommended the following at this time: sleep study with potential positive airway pressure titration. (We will score hypopneas at 3% and split the sleep study into diagnostic  and treatment portion, if the estimated. 2 hour AHI is >15/h).   I explained the sleep test procedure to the patient and also outlined possible surgical and non-surgical treatment options of OSA, including the use of a custom-made dental device (which would require a referral to a specialist dentist or oral surgeon), upper airway surgical options, such as pillar implants, radiofrequency surgery, tongue base surgery, and UPPP (which would involve a referral to an ENT surgeon). Rarely, jaw surgery such as mandibular advancement may be considered.  I also explained the CPAP treatment option to the patient, who indicated that she would be willing to continue with PAP if the need arises. I explained the importance of being compliant with PAP treatment, not only for insurance purposes but primarily to improve Her symptoms, and for the patient's long term health benefit, including to reduce Her cardiovascular risks. I answered all her questions today and the patient was in agreement. I would like to see her back after the sleep study is completed and encouraged her to call with any interim questions, concerns, problems or updates.   Thank you very much for allowing me to participate in the care of this nice patient. If I can be of any further assistance to you please do not hesitate to call me at [phone].  Sincerely,   Huston Foley, MD, PhD

## 2015-06-24 NOTE — Patient Instructions (Addendum)
Based on your symptoms and your exam you have obstructive sleep apnea (OSA). We should proceed with another sleep study to determine how severe it is and if you need your CPAP treatment and mask adjusted. Please remember, the risks and ramifications of moderate to severe obstructive sleep apnea or OSA are: Cardiovascular disease, including congestive heart failure, stroke, difficult to control hypertension, arrhythmias, and even type 2 diabetes has been linked to untreated OSA. Sleep apnea causes disruption of sleep and sleep deprivation in most cases, which, in turn, can cause recurrent headaches, problems with memory, mood, concentration, focus, and vigilance. Most people with untreated sleep apnea report excessive daytime sleepiness, which can affect their ability to drive. Please do not drive if you feel sleepy.   I will likely see you back after your sleep study to go over the test results and where to go from there. We will call you after your sleep study to advise about the results (most likely, you will hear from Lafonda Mosses, my nurse) and to set up an appointment at the time, as necessary.    Our sleep lab administrative assistant, Alvis Lemmings will meet with you or call you to schedule your sleep study. If you don't hear back from her by next week please feel free to call her at [phone]. This is her direct line and please leave a message with your phone number to call back if you get the voicemail box. She will call back as soon as possible.   Please stop smoking.

## 2015-08-01 ENCOUNTER — Ambulatory Visit (INDEPENDENT_AMBULATORY_CARE_PROVIDER_SITE_OTHER): Payer: 59 | Admitting: Physician Assistant

## 2015-08-01 VITALS — BP 132/80 | HR 78 | Temp 98.2°F | Resp 16 | Ht 65.0 in

## 2015-08-01 DIAGNOSIS — R110 Nausea: Secondary | ICD-10-CM | POA: Diagnosis not present

## 2015-08-01 DIAGNOSIS — A09 Infectious gastroenteritis and colitis, unspecified: Secondary | ICD-10-CM

## 2015-08-01 DIAGNOSIS — R197 Diarrhea, unspecified: Secondary | ICD-10-CM | POA: Diagnosis not present

## 2015-08-01 DIAGNOSIS — R1030 Lower abdominal pain, unspecified: Secondary | ICD-10-CM | POA: Diagnosis not present

## 2015-08-01 DIAGNOSIS — K5732 Diverticulitis of large intestine without perforation or abscess without bleeding: Secondary | ICD-10-CM

## 2015-08-01 LAB — POC HEMOCCULT BLD/STL (OFFICE/1-CARD/DIAGNOSTIC): FECAL OCCULT BLD: POSITIVE — AB

## 2015-08-01 LAB — COMPLETE METABOLIC PANEL WITH GFR
ALBUMIN: 3.9 g/dL (ref 3.6–5.1)
ALK PHOS: 39 U/L (ref 33–115)
ALT: 14 U/L (ref 6–29)
AST: 18 U/L (ref 10–30)
BILIRUBIN TOTAL: 0.7 mg/dL (ref 0.2–1.2)
BUN: 10 mg/dL (ref 7–25)
CALCIUM: 9 mg/dL (ref 8.6–10.2)
CO2: 28 mmol/L (ref 20–31)
Chloride: 100 mmol/L (ref 98–110)
Creat: 0.65 mg/dL (ref 0.50–1.10)
Glucose, Bld: 102 mg/dL — ABNORMAL HIGH (ref 65–99)
POTASSIUM: 4 mmol/L (ref 3.5–5.3)
SODIUM: 137 mmol/L (ref 135–146)
Total Protein: 6.9 g/dL (ref 6.1–8.1)

## 2015-08-01 LAB — POCT CBC
Granulocyte percent: 64.3 %G (ref 37–80)
HCT, POC: 40.9 % (ref 37.7–47.9)
Hemoglobin: 14.1 g/dL (ref 12.2–16.2)
LYMPH, POC: 2.8 (ref 0.6–3.4)
MCH, POC: 30.3 pg (ref 27–31.2)
MCHC: 34.4 g/dL (ref 31.8–35.4)
MCV: 88.2 fL (ref 80–97)
MID (cbc): 0.7 (ref 0–0.9)
MPV: 8.2 fL (ref 0–99.8)
PLATELET COUNT, POC: 157 10*3/uL (ref 142–424)
POC GRANULOCYTE: 6.3 (ref 2–6.9)
POC LYMPH %: 28.8 % (ref 10–50)
POC MID %: 6.9 %M (ref 0–12)
RBC: 4.63 M/uL (ref 4.04–5.48)
RDW, POC: 13.4 %
WBC: 9.8 10*3/uL (ref 4.6–10.2)

## 2015-08-01 LAB — POCT URINALYSIS DIP (MANUAL ENTRY)
BILIRUBIN UA: NEGATIVE
GLUCOSE UA: NEGATIVE
Ketones, POC UA: NEGATIVE
Leukocytes, UA: NEGATIVE
Nitrite, UA: NEGATIVE
Protein Ur, POC: NEGATIVE
RBC UA: NEGATIVE
UROBILINOGEN UA: 0.2
pH, UA: 5

## 2015-08-01 LAB — GLUCOSE, POCT (MANUAL RESULT ENTRY): POC Glucose: 116 mg/dl — AB (ref 70–99)

## 2015-08-01 LAB — POC MICROSCOPIC URINALYSIS (UMFC)

## 2015-08-01 MED ORDER — ONDANSETRON 4 MG PO TBDP
4.0000 mg | ORAL_TABLET | Freq: Once | ORAL | Status: AC
  Administered 2015-08-01: 4 mg via ORAL

## 2015-08-01 MED ORDER — ONDANSETRON HCL 4 MG/2ML IJ SOLN: 2.0000 mg | Freq: Once | INTRAMUSCULAR | Status: DC

## 2015-08-01 MED ORDER — METRONIDAZOLE 500 MG PO TABS: 500.0000 mg | ORAL_TABLET | Freq: Two times a day (BID) | ORAL | Status: DC

## 2015-08-01 MED ORDER — CIPROFLOXACIN HCL 500 MG PO TABS: 500.0000 mg | ORAL_TABLET | Freq: Two times a day (BID) | ORAL | Status: AC

## 2015-08-01 NOTE — Progress Notes (Signed)
[facility] [address], [place] [address] [phone]  Date:  08/01/2015   Name:  Renee Ho   DOB:  November 16, 1975   MRN:  [id number]  PCP:  Shade Flood, MD    History of Present Illness:  Renee Ho is a 39 y.o. female patient who presents to [facility] for cc of lower right abdominal pain (burning sensation), and diarrhea since yesterday.  Patient has noticed a burning sensation and pain that is aggravated with walking.  Today, she developed diarrhea today and had 4 episodes.  Diarrhea is bloody with bright red blood in the stool and along the toilet paper.  This was enough to change the color of the water in the toilet.  No etOH use, no daily nsaid use.  She currently has nausea without vomiting.  She has no urinary symptoms including hematuria, dysuria, or frequency. She has no vaginal changes of abnormal discharge, odor, rash, or bleeding. Hysterectomy with one ovary remaining. Hx of diverticulitis by CT 2013.  Diverticulosis found on CT months before diverticulitis dx.    Patient Active Problem List   Diagnosis Date Noted  . Injury of foot, left 07/18/2013  . Dysuria 05/22/2013  . Preventive measure 05/22/2013  . HTN (hypertension) 09/18/2012  . Morbid obesity (HCC) 09/18/2012  . S/P hysterectomy 06/20/2012  . H/O colonoscopy with polypectomy 06/20/2012  . Thrush, oral 06/19/2012  . Diverticulitis 06/13/2012  . Depression 12/30/2011  . Plantar fasciitis 04/22/2011  . HYPERTRIGLYCERIDEMIA 02/21/2008  . DIABETES MELLITUS, TYPE II 02/24/2007  . POLYCYSTIC OVARIAN DISEASE 02/24/2007  . HYPERTENSION 02/24/2007  . SLEEP APNEA on CPAP 02/24/2007    Past Medical History  Diagnosis Date  . Diabetes mellitus   . Diverticula of intestine   . High cholesterol   . Sleep apnea   . Diabetes mellitus without complication (HCC)   . Hypertension   . Polycystic ovarian syndrome   . Depression     Past Surgical History  Procedure Laterality Date   . Ovarian cyst removal    . Knee arthroscopy  07/11/2012    Procedure: ARTHROSCOPY KNEE;  Surgeon: Cammy Copa, MD;  Location: [facility] OR;  Service: Orthopedics;  Laterality: Right;  Right knee arthroscopy with debridement  . Abdominal hysterectomy    . Cyst removal neck    . Knee surgery      Social History  Substance Use Topics  . Smoking status: Current Every Day Smoker -- 0.50 packs/day  . Smokeless tobacco: None  . Alcohol Use: No     Comment: Rarely.    Family History  Problem Relation Age of Onset  . Cancer Mother   . Mental illness Mother   . Cancer Maternal Grandfather   . Stroke Paternal Grandmother     Allergies  Allergen Reactions  . Metoclopramide Hcl     REACTION: Psychosis  . Morphine Other (See Comments)    migraine  . Morphine And Related Other (See Comments)    Migraines  . Reglan [Metoclopramide] Other (See Comments)    Psychotic episodes  . Toradol [Ketorolac Tromethamine] Rash    Medication list has been reviewed and updated.  Current Outpatient Prescriptions on File Prior to Visit  Medication Sig Dispense Refill  . aspirin EC 81 MG tablet Take 81 mg by mouth daily.    [name] atorvastatin (LIPITOR) 10 MG tablet Take 1 tablet (10 mg total) by mouth daily. 90 tablet 1  . DULoxetine (CYMBALTA) 60 MG capsule TAKE  1 CAPSULE BY MOUTH DAILY. 90 capsule 1  . hydrochlorothiazide (HYDRODIURIL) 25 MG tablet TAKE 1 TABLET BY MOUTH DAILY. 90 tablet 1  . insulin glargine (LANTUS) 100 UNIT/ML injection Inject 50 units into the skin at bedtime. 20 mL 3  . JANUVIA 100 MG tablet TAKE 1 TABLET BY MOUTH DAILY. 90 tablet 1  . lisinopril (PRINIVIL,ZESTRIL) 5 MG tablet TAKE 1 TABLET BY MOUTH ONCE DAILY 90 tablet 1  . metFORMIN (GLUCOPHAGE) 1000 MG tablet TAKE 1 TABLET (1,000 MG TOTAL) BY MOUTH 2 (TWO) TIMES DAILY WITH A MEAL. 180 tablet 1   No current facility-administered medications on file prior to visit.    ROS ROS otherwise unremarkable unless listed above.    Physical Examination: BP 132/80 mmHg  Pulse 78  Temp(Src) 98.2 F (36.8 C) (Oral)  Resp 16  Ht 5\' 5"  (1.651 m)  SpO2 98% Ideal Body Weight: Weight in (lb) to have BMI = 25: 149.9  Physical Exam  Constitutional: She is oriented to person, place, and time. She appears well-developed and well-nourished. No distress.  HENT:  Head: Normocephalic and atraumatic.  Eyes: Pupils are equal, round, and reactive to light.  Cardiovascular: Normal rate, regular rhythm, normal heart sounds and intact distal pulses.  Exam reveals no friction rub.   No murmur heard. Pulmonary/Chest: Effort normal and breath sounds normal. No respiratory distress. She has no wheezes.  Abdominal: Soft. Normal appearance and bowel sounds are normal. There is no hepatosplenomegaly. There is no tenderness. There is negative Murphy's sign.  LLQ pain upon palpation>, but also includes suprapubic pain, and tenderness.      Neurological: She is alert and oriented to person, place, and time.  Skin: Skin is warm and dry.  Psychiatric: She has a normal mood and affect. Her behavior is normal.      Assessment and Plan: Renee Ho is a 39 y.o. female who is here today for chief complaint Results for orders placed or performed in visit on 08/01/15  POCT CBC  Result Value Ref Range   WBC 9.8 4.6 - 10.2 K/uL   Lymph, poc 2.8 0.6 - 3.4   POC LYMPH PERCENT 28.8 10 - 50 %L   MID (cbc) 0.7 0 - 0.9   POC MID % 6.9 0 - 12 %M   POC Granulocyte 6.3 2 - 6.9   Granulocyte percent 64.3 37 - 80 %G   RBC 4.63 4.04 - 5.48 M/uL   Hemoglobin 14.1 12.2 - 16.2 g/dL   HCT, POC 16.140.9 09.637.7 - 47.9 %   MCV 88.2 80 - 97 fL   MCH, POC 30.3 27 - 31.2 pg   MCHC 34.4 31.8 - 35.4 g/dL   RDW, POC 04.513.4 %   Platelet Count, POC 157 142 - 424 K/uL   MPV 8.2 0 - 99.8 fL  POCT Microscopic Urinalysis ([facility])  Result Value Ref Range   WBC,UR,HPF,POC None None WBC/hpf   RBC,UR,HPF,POC None None RBC/hpf   Bacteria Few (A) None, Too numerous to  count   Mucus Present (A) Absent   Epithelial Cells, UR Per Microscopy Few (A) None, Too numerous to count cells/hpf   Amorphous present   POCT urinalysis dipstick  Result Value Ref Range   Color, UA light yellow (A) yellow   Clarity, UA clear clear   Glucose, UA negative negative   Bilirubin, UA negative negative   Ketones, POC UA negative negative   Spec Grav, UA <=1.005    Blood, UA negative negative  pH, UA 5.0    Protein Ur, POC negative negative   Urobilinogen, UA 0.2    Nitrite, UA Negative Negative   Leukocytes, UA Negative Negative  POC Hemoccult Bld/Stl (1-Cd Office Dx)  Result Value Ref Range   Card #1 Date 08/01/2015    Fecal Occult Blood, POC Positive (A) Negative  POCT glucose (manual entry)  Result Value Ref Range   POC Glucose 116 (A) 70 - 99 mg/dl    ASSESSMENT: 39 year old female is here today for abdominal pain and diarrhea.   Patient has hx of diverticulitis.  Likely early stages of potential flare up at this time.  I will treat her at this time to spare further imaging exposure.   Placed on clear broths at this time; Will follow up with me in 48-72 hours.   Diverticulitis of colon - Plan: metroNIDAZOLE (FLAGYL) 500 MG tablet, ciprofloxacin (CIPRO) 500 MG tablet  Lower abdominal pain - Plan: POCT CBC, POCT Microscopic Urinalysis ([facility]), POCT urinalysis dipstick, COMPLETE METABOLIC PANEL WITH GFR, POC Hemoccult Bld/Stl (1-Cd Office Dx), POCT glucose (manual entry), ondansetron (ZOFRAN-ODT) disintegrating tablet 4 mg, metroNIDAZOLE (FLAGYL) 500 MG tablet, ciprofloxacin (CIPRO) 500 MG tablet  Nausea without vomiting - Plan: POCT glucose (manual entry), ondansetron (ZOFRAN-ODT) disintegrating tablet 4 mg, metroNIDAZOLE (FLAGYL) 500 MG tablet, ciprofloxacin (CIPRO) 500 MG tablet, DISCONTINUED: ondansetron (ZOFRAN) injection 2 mg  Diarrhea, unspecified type - Plan: POCT glucose (manual entry)  Infectious diarrhea in adult patient - Plan: metroNIDAZOLE (FLAGYL)  500 MG tablet, ciprofloxacin (CIPRO) 500 MG tablet  Trena Platt, PA-C [facility] 10/21/20166:57 PM

## 2015-08-01 NOTE — Patient Instructions (Addendum)
Please hydrate well.  Only consume clear liquids at this time (broths, etc.).  Diverticulitis Diverticulitis is when small pockets that have formed in your colon (large intestine) become infected or swollen. HOME CARE  Follow your doctor's instructions.  Follow a special diet if told by your doctor.  When you feel better, your doctor may tell you to change your diet. You may be told to eat a lot of fiber. Fruits and vegetables are good sources of fiber. Fiber makes it easier to poop (have bowel movements).  Take supplements or probiotics as told by your doctor.  Only take medicines as told by your doctor.  Keep all follow-up visits with your doctor. GET HELP IF:  Your pain does not get better.  You have a hard time eating food.  You are not pooping like normal. GET HELP RIGHT AWAY IF:  Your pain gets worse.  Your problems do not get better.  Your problems suddenly get worse.  You have a fever.  You keep throwing up (vomiting).  You have bloody or black, tarry poop (stool). MAKE SURE YOU:   Understand these instructions.  Will watch your condition.  Will get help right away if you are not doing well or get worse.   This information is not intended to replace advice given to you by your health care provider. Make sure you discuss any questions you have with your health care provider.   Document Released: 03/15/2008 Document Revised: 10/02/2013 Document Reviewed: 08/22/2013 Elsevier Interactive Patient Education [employer]2016 Elsevier Inc.

## 2015-08-03 ENCOUNTER — Ambulatory Visit (INDEPENDENT_AMBULATORY_CARE_PROVIDER_SITE_OTHER): Payer: 59 | Admitting: Neurology

## 2015-08-03 ENCOUNTER — Ambulatory Visit (INDEPENDENT_AMBULATORY_CARE_PROVIDER_SITE_OTHER): Payer: 59 | Admitting: Physician Assistant

## 2015-08-03 VITALS — BP 132/78 | HR 88 | Temp 98.3°F | Resp 18

## 2015-08-03 DIAGNOSIS — R110 Nausea: Secondary | ICD-10-CM | POA: Diagnosis not present

## 2015-08-03 DIAGNOSIS — G4733 Obstructive sleep apnea (adult) (pediatric): Secondary | ICD-10-CM | POA: Diagnosis not present

## 2015-08-03 DIAGNOSIS — K5732 Diverticulitis of large intestine without perforation or abscess without bleeding: Secondary | ICD-10-CM | POA: Diagnosis not present

## 2015-08-03 DIAGNOSIS — K5793 Diverticulitis of intestine, part unspecified, without perforation or abscess with bleeding: Secondary | ICD-10-CM

## 2015-08-03 DIAGNOSIS — R1030 Lower abdominal pain, unspecified: Secondary | ICD-10-CM

## 2015-08-03 DIAGNOSIS — A09 Infectious gastroenteritis and colitis, unspecified: Secondary | ICD-10-CM

## 2015-08-03 DIAGNOSIS — G4730 Sleep apnea, unspecified: Secondary | ICD-10-CM

## 2015-08-03 DIAGNOSIS — R1032 Left lower quadrant pain: Secondary | ICD-10-CM | POA: Diagnosis not present

## 2015-08-03 DIAGNOSIS — G4761 Periodic limb movement disorder: Secondary | ICD-10-CM

## 2015-08-03 DIAGNOSIS — G479 Sleep disorder, unspecified: Secondary | ICD-10-CM

## 2015-08-03 DIAGNOSIS — G4710 Hypersomnia, unspecified: Secondary | ICD-10-CM

## 2015-08-03 LAB — POCT CBC
Granulocyte percent: 69.2 %G (ref 37–80)
HEMATOCRIT: 41.2 % (ref 37.7–47.9)
Hemoglobin: 14.8 g/dL (ref 12.2–16.2)
Lymph, poc: 2.4 (ref 0.6–3.4)
MCH, POC: 31.5 pg — AB (ref 27–31.2)
MCHC: 36 g/dL — AB (ref 31.8–35.4)
MCV: 87.6 fL (ref 80–97)
MID (CBC): 0.6 (ref 0–0.9)
MPV: 8.1 fL (ref 0–99.8)
POC Granulocyte: 6.7 (ref 2–6.9)
POC LYMPH %: 25.1 % (ref 10–50)
POC MID %: 5.7 %M (ref 0–12)
Platelet Count, POC: 166 10*3/uL (ref 142–424)
RBC: 4.7 M/uL (ref 4.04–5.48)
RDW, POC: 13.3 %
WBC: 9.7 10*3/uL (ref 4.6–10.2)

## 2015-08-03 MED ORDER — ONDANSETRON 4 MG PO TBDP
4.0000 mg | ORAL_TABLET | Freq: Once | ORAL | Status: AC
  Administered 2015-08-03: 4 mg via ORAL

## 2015-08-03 MED ORDER — METRONIDAZOLE 500 MG PO TABS: 500.0000 mg | ORAL_TABLET | Freq: Four times a day (QID) | ORAL | Status: AC

## 2015-08-03 MED ORDER — ONDANSETRON 8 MG PO TBDP
8.0000 mg | ORAL_TABLET | Freq: Three times a day (TID) | ORAL | Status: DC | PRN
Start: 2015-08-03 — End: 2015-11-25

## 2015-08-03 NOTE — Progress Notes (Signed)
[facility] [address], [place] [id number] [phone]  Date:  08/03/2015   Name:  Renee Ho   DOB:  02/23/1976   MRN:  [id number]  PCP:  Shade FloodGREENE,JEFFREY R, MD    History of Present Illness:  Renee Ho is a 39 y.o. female patient who [facility] up of [facility].   ----Patient was seen here 2 days ago for lower quadrant pain predominantly at the llq, and blood diarrhea.  She was afebrile with vitals normal.  With, cbc and UA normal, and symptoms historically identical to her past 2 episodes of diverticulitis (1 confirmed by ct with hx of diverticulosis seen on the ct prior)--started her on diverticulitis regimen of metro/cipro bid---  Today she states that she is feeling better.  She has had less bowel movements.  They have less blood, and do not change the color of the toilet.  She continues to have cramping and nausea.  The medications are adding to her nausea.  She denies fever or dizziness.  She has been on a clear liquids diet only.     Patient Active Problem List   Diagnosis Date Noted  . Injury of foot, left 07/18/2013  . Dysuria 05/22/2013  . Preventive measure 05/22/2013  . HTN (hypertension) 09/18/2012  . Morbid obesity (HCC) 09/18/2012  . S/P hysterectomy 06/20/2012  . H/O colonoscopy with polypectomy 06/20/2012  . Thrush, oral 06/19/2012  . Diverticulitis 06/13/2012  . Depression 12/30/2011  . Plantar fasciitis 04/22/2011  . HYPERTRIGLYCERIDEMIA 02/21/2008  . DIABETES MELLITUS, TYPE II 02/24/2007  . POLYCYSTIC OVARIAN DISEASE 02/24/2007  . HYPERTENSION 02/24/2007  . SLEEP APNEA on CPAP 02/24/2007    Past Medical History  Diagnosis Date  . Diabetes mellitus   . Diverticula of intestine   . High cholesterol   . Sleep apnea   . Diabetes mellitus without complication (HCC)   . Hypertension   . Polycystic ovarian syndrome   . Depression     Past Surgical History  Procedure Laterality Date  . Ovarian  cyst removal    . Knee arthroscopy  07/11/2012    Procedure: ARTHROSCOPY KNEE;  Surgeon: Cammy CopaGregory Scott Dean, MD;  [facility];  Service: Orthopedics;  Laterality: Right;  Right knee arthroscopy with debridement  . Abdominal hysterectomy    . Cyst removal neck    . Knee surgery      Social History  Substance Use Topics  . Smoking status: Current Every Day Smoker -- 0.50 packs/day  . Smokeless tobacco: None  . Alcohol Use: No     Comment: Rarely.    Family History  Problem Relation Age of Onset  . Cancer Mother   . Mental illness Mother   . Cancer Maternal Grandfather   . Stroke Paternal Grandmother     Allergies  Allergen Reactions  . Metoclopramide Hcl     REACTION: Psychosis  . Morphine Other (See Comments)    migraine  . Morphine And Related Other (See Comments)    Migraines  . Reglan [Metoclopramide] Other (See Comments)    Psychotic episodes  . Toradol [Ketorolac Tromethamine] Rash    Medication list has been reviewed and updated.  Current Outpatient Prescriptions on File Prior to Visit  Medication Sig Dispense Refill  . aspirin EC 81 MG tablet Take 81 mg by mouth daily.    [name]. atorvastatin (LIPITOR) 10 MG tablet Take 1 tablet (10 mg total) by mouth daily. 90 tablet 1  .  ciprofloxacin (CIPRO) 500 MG tablet Take 1 tablet (500 mg total) by mouth 2 (two) times daily. 20 tablet 0  . DULoxetine (CYMBALTA) 60 MG capsule TAKE 1 CAPSULE BY MOUTH DAILY. 90 capsule 1  . hydrochlorothiazide (HYDRODIURIL) 25 MG tablet TAKE 1 TABLET BY MOUTH DAILY. 90 tablet 1  . insulin glargine (LANTUS) 100 UNIT/ML injection Inject 50 units into the skin at bedtime. 20 mL 3  . JANUVIA 100 MG tablet TAKE 1 TABLET BY MOUTH DAILY. 90 tablet 1  . lisinopril (PRINIVIL,ZESTRIL) 5 MG tablet TAKE 1 TABLET BY MOUTH ONCE DAILY 90 tablet 1  . metFORMIN (GLUCOPHAGE) 1000 MG tablet TAKE 1 TABLET (1,000 MG TOTAL) BY MOUTH 2 (TWO) TIMES DAILY WITH A MEAL. 180 tablet 1  . metroNIDAZOLE (FLAGYL) 500 MG  tablet Take 1 tablet (500 mg total) by mouth 2 (two) times daily. 20 tablet 0   No current facility-administered medications on file prior to visit.    ROS ROS otherwise unremarkable unless listed above.   Physical Examination: BP 132/78 mmHg  Pulse 88  Temp(Src) 98.3 F (36.8 C)  Resp 18  SpO2 98% Ideal Body Weight:    Physical Exam  Constitutional: She is oriented to person, place, and time. She appears well-developed and well-nourished. No distress.  HENT:  Head: Normocephalic and atraumatic.  Right Ear: External ear normal.  Left Ear: External ear normal.  Eyes: Conjunctivae and EOM are normal. Pupils are equal, round, and reactive to light.  Cardiovascular: Normal rate.   Pulmonary/Chest: Effort normal. No respiratory distress.  Abdominal: Normal appearance. Bowel sounds are increased. There is no hepatosplenomegaly. There is tenderness in the left lower quadrant. There is no tenderness at McBurney's point.  Neurological: She is alert and oriented to person, place, and time.  Skin: She is not diaphoretic.  Psychiatric: She has a normal mood and affect. Her behavior is normal.   Results for orders placed or performed in visit on 08/03/15  POCT CBC  Result Value Ref Range   WBC 9.7 4.6 - 10.2 K/uL   Lymph, poc 2.4 0.6 - 3.4   POC LYMPH PERCENT 25.1 10 - 50 %L   MID (cbc) 0.6 0 - 0.9   POC MID % 5.7 0 - 12 %M   POC Granulocyte 6.7 2 - 6.9   Granulocyte percent 69.2 37 - 80 %G   RBC 4.70 4.04 - 5.48 M/uL   Hemoglobin 14.8 12.2 - 16.2 g/dL   HCT, POC 08.6 57.8 - 47.9 %   MCV 87.6 80 - 97 fL   MCH, POC 31.5 (A) 27 - 31.2 pg   MCHC 36.0 (A) 31.8 - 35.4 g/dL   RDW, POC 46.9 %   Platelet Count, POC 166 142 - 424 K/uL   MPV 8.1 0 - 99.8 fL      Assessment and Plan: Renee Ho is a 39 y.o. female who is here today for follow up of diverticulitis.  Appears to be improving.  Vitals and cbc unremarkable.  We can continue to treat her outpatient at this time.    -Adding zofran  -Advised that we will increase metronidazole q6 hours.  If she has difficulty tolerating metronidazole, she can return back to twice per day. -return in 5 days for recheck (08/08/2015). -Alarming sxs warranting immediate return given -will ease into soft foods diet, no nuts, seeds, etc given.    1. Nausea without vomiting - ondansetron (ZOFRAN-ODT) disintegrating tablet 4 mg; Take 1 tablet (4 mg total) by  mouth once. - POCT CBC  2. Left lower quadrant pain - POCT CBC  3. Diverticulitis of intestine without perforation or abscess with bleeding - POCT CBC   Trena Platt, PA-C [facility] 08/03/2015 8:50 AM

## 2015-08-03 NOTE — Patient Instructions (Signed)
We are going to change the metronidazole from twice per day to every 6 hours.  If you find that this is too harsh or intolerable, go back to twice per day. If you are feeling worse, developing fever, dizziness, uncontrolled nausea--you need to return sooner!! Diverticulitis Diverticulitis is when small pockets that have formed in your colon (large intestine) become infected or swollen. HOME CARE  Follow your doctor's instructions.  Follow a special diet if told by your doctor.  When you feel better, your doctor may tell you to change your diet. You may be told to eat a lot of fiber. Fruits and vegetables are good sources of fiber. Fiber makes it easier to poop (have bowel movements).  Take supplements or probiotics as told by your doctor.  Only take medicines as told by your doctor.  Keep all follow-up visits with your doctor. GET HELP IF:  Your pain does not get better.  You have a hard time eating food.  You are not pooping like normal. GET HELP RIGHT AWAY IF:  Your pain gets worse.  Your problems do not get better.  Your problems suddenly get worse.  You have a fever.  You keep throwing up (vomiting).  You have bloody or black, tarry poop (stool). MAKE SURE YOU:   Understand these instructions.  Will watch your condition.  Will get help right away if you are not doing well or get worse.   This information is not intended to replace advice given to you by your health care provider. Make sure you discuss any questions you have with your health care provider.   Document Released: 03/15/2008 Document Revised: 10/02/2013 Document Reviewed: 08/22/2013 Elsevier Interactive Patient Education [employer]2016 Elsevier Inc.

## 2015-08-04 NOTE — Sleep Study (Signed)
Please see the scanned sleep study interpretation located in the Procedure tab within the Chart Review section. 

## 2015-08-05 ENCOUNTER — Ambulatory Visit: Payer: Self-pay | Admitting: Pharmacist

## 2015-08-08 ENCOUNTER — Telehealth: Payer: Self-pay | Admitting: Neurology

## 2015-08-08 DIAGNOSIS — G4733 Obstructive sleep apnea (adult) (pediatric): Secondary | ICD-10-CM

## 2015-08-08 NOTE — Telephone Encounter (Signed)
Renee Ho:  Patient referred by Dr. Neva SeatGreene, seen by me on 06/24/15, split study on 08/03/15, Ins: [employer]. Please call and notify patient that the recent sleep study confirmed the diagnosis of severe OSA. She did well with CPAP during the study with significant improvement of the respiratory events. Therefore, I would like start the patient on CPAP therapy at home by prescribing a machine for home use. I placed the order in the chart. The patient will need a follow up appointment with me in 8 to 10 weeks post set up that has to be scheduled; please go ahead and schedule while you have the patient on the phone and make sure patient understands the importance of keeping this window for the FU appointment, as it is often an insurance requirement and failing to adhere to this may result in losing coverage for sleep apnea treatment.  Please re-enforce the importance of compliance with treatment and the need for us to monitor compliance data - again an insurance requirement and good feedback for the patient as far as how they are doing.  Also remind patient, that any upcoming CPAP machine or mask issues, should be first addressed with the DME company. Please ask if patient has a preference regarding DME company.  Please arrange for CPAP set up at home through a DME company of patient's choice - once you have spoken to the patient - and faxed/routed report to PCP and referring MD (if other than PCP), you can close this encounter, thanks,   Huston FoleySaima Varnell Donate, MD, PhD [facility]

## 2015-08-11 NOTE — Telephone Encounter (Signed)
I spoke to patient and she is aware of results and recommendation. She [facility]. I will fax study to PCP. I will also send patient a letter reminding her to make f/u and stress importance of compliance.

## 2015-08-13 ENCOUNTER — Telehealth: Payer: Self-pay | Admitting: Physician Assistant

## 2015-08-13 ENCOUNTER — Telehealth: Payer: Self-pay | Admitting: Family Medicine

## 2015-08-13 NOTE — Telephone Encounter (Signed)
Done. In FMLA box. Thanks.

## 2015-08-13 NOTE — Telephone Encounter (Signed)
Received forms, scanned and faxed them back to the company on 08/13/15

## 2015-08-13 NOTE — Telephone Encounter (Signed)
Patient faxed FMLA forms to be completed for a chronic condition of hypoglycemia. Completed paperwork based on office notes based on 06/18/2015. Highlighted sections that need to be reviewed. Please sign and [facility] upon completion. Forms placed in your box on 08/13/2015.  Thanks :-)

## 2015-08-13 NOTE — Telephone Encounter (Signed)
Patient faxed FMLA forms to be completed about her diverticulitis. She was seen on 08/01/2015 and 08/03/2015. I have already completed the paperwork. Can you please review it and sign? [facility] when finished. Forms placed in your box on 08/13/2015.   Thanks!

## 2015-08-14 ENCOUNTER — Ambulatory Visit: Payer: Self-pay | Admitting: Pharmacist

## 2015-08-19 ENCOUNTER — Ambulatory Visit (INDEPENDENT_AMBULATORY_CARE_PROVIDER_SITE_OTHER): Payer: Self-pay | Admitting: Family Medicine

## 2015-08-19 VITALS — BP 122/76 | Wt 265.0 lb

## 2015-08-19 DIAGNOSIS — E119 Type 2 diabetes mellitus without complications: Secondary | ICD-10-CM

## 2015-08-19 NOTE — Patient Instructions (Signed)
1)  Continue exercising throughout the winter months! 2)  Attempt to resume regular testing 3)  Great job with diet, continue making improvements, specifically when eating out 4)  Follow-up in 3 months on Tuesday Feb 14th @ 10:00 am  Great to see you today!

## 2015-08-19 NOTE — Progress Notes (Signed)
Subjective:  Patient presents today for 3 month diabetes follow-up as part of the employer-sponsored Link to Wellness program.  Current diabetes regimen includes metformin, Januvia, and Lantus.  Patient also continues on daily ASA, ACE Inhibitor and statin.  Most recent MD follow-up was Sept 2016.  Patient has follow-up scheduled for December 2016.  No med changes.  No major health changes, although patient did recently recover from an episode of diverticulitis which was treated with antibiotics.     Assessment:  Patient is a 39 yo female with DM type 2. Most recent A1C was 7.1% (prev 7.4) which remains above goal of less than 7%.    Weight has decreased by 12 lb since last visit.  Patient has made significant improvement in medication compliance and reports rarely missing a dose of her diabetes medications.  She continues to need improvement in compliance with other meds.  She is not testing regularly at this time, and tests only 1-2 times per month.  No hypoglycemia, although this has been a problem for the patient in the past.  Eye and dental exams up to date and normal.  Patient denies symptoms of neuropathy or infection.  She did have recent injury to one toe, but this is healing well with no visible redness or signs of infection.  Lifestyle:  Physical Activity-  No major changes at this time.  She continues walking her dog in neighborhood and at a local park, at least twice weekly for 45 minutes each time. I have encouraged her to maintain this throughout the winter months.    Nutrition-  Continues limiting carbs, and attempting to make healthier choices.  Still trying to limit pasta, no pasta this week.  She is also attempting to eat more fresh fruits and vegetables.  And is limiting snacks, although she does have one gummy fruit snack nightly as a snack.  Last night - salmon patties, green beans, potatoes Night previous - Grilled chicken, broccoli, squash Still eating fruits and vegetables.    Smoking Cessation - continues smoking ~10 cig/day, sometimes more if stress level is high.  She has contemplated quitting and wants to set a goal to quit after the New Years.  She does not want to taper down, but wants to set a quit date for January 2017 and stop on that day using NRT.  I have provided NRT, patches and gum, today in case she wants to quit earlier.  Will start 21 mg patch daily and 2 mg gum as needed on selected quit date.     Goals for Next Visit: 1)  Continue exercising throughout the winter months! 2)  Attempt to resume regular testing 3)  Great job with diet, continue making improvements, specifically when eating out 4)  Continue to plan for smoking cessation quit date of Jan 2017, using NRT patches and gum 5)  Follow-up in 3 months on Tuesday Feb 14th @ 10:00 am  Great to see you today!  Renee HildingElizabeth Romir Ho, PharmD [facility]

## 2015-08-19 NOTE — Telephone Encounter (Signed)
Submitted to fmla department, for submission--yesterday

## 2015-08-26 NOTE — Progress Notes (Signed)
l °

## 2015-09-01 NOTE — Progress Notes (Signed)
  Medical screening examination/treatment/procedure(s) were performed by non-physician practitioner and as supervising physician I was immediately available for consultation/collaboration.     

## 2015-09-12 DIAGNOSIS — G4730 Sleep apnea, unspecified: Secondary | ICD-10-CM

## 2015-09-22 ENCOUNTER — Encounter: Payer: Self-pay | Admitting: Family Medicine

## 2015-09-22 ENCOUNTER — Ambulatory Visit (INDEPENDENT_AMBULATORY_CARE_PROVIDER_SITE_OTHER): Payer: 59 | Admitting: Family Medicine

## 2015-09-22 VITALS — BP 126/78 | HR 89 | Temp 99.2°F | Resp 16 | Ht 66.0 in | Wt 268.2 lb

## 2015-09-22 DIAGNOSIS — Z794 Long term (current) use of insulin: Secondary | ICD-10-CM | POA: Diagnosis not present

## 2015-09-22 DIAGNOSIS — Z720 Tobacco use: Secondary | ICD-10-CM | POA: Diagnosis not present

## 2015-09-22 DIAGNOSIS — E119 Type 2 diabetes mellitus without complications: Secondary | ICD-10-CM | POA: Diagnosis not present

## 2015-09-22 DIAGNOSIS — F329 Major depressive disorder, single episode, unspecified: Secondary | ICD-10-CM | POA: Diagnosis not present

## 2015-09-22 DIAGNOSIS — F32A Depression, unspecified: Secondary | ICD-10-CM

## 2015-09-22 LAB — POCT GLYCOSYLATED HEMOGLOBIN (HGB A1C): Hemoglobin A1C: 7.1

## 2015-09-22 LAB — GLUCOSE, POCT (MANUAL RESULT ENTRY): POC Glucose: 139 mg/dl — AB (ref 70–99)

## 2015-09-22 LAB — COMPLETE METABOLIC PANEL WITH GFR
ALT: 15 U/L (ref 6–29)
AST: 16 U/L (ref 10–30)
Albumin: 3.8 g/dL (ref 3.6–5.1)
Alkaline Phosphatase: 50 U/L (ref 33–115)
BUN: 8 mg/dL (ref 7–25)
CO2: 28 mmol/L (ref 20–31)
Calcium: 9.3 mg/dL (ref 8.6–10.2)
Chloride: 100 mmol/L (ref 98–110)
Creat: 0.59 mg/dL (ref 0.50–1.10)
GFR, Est African American: >89 mL/min (ref >=60)
GFR, Est Non African American: >89 mL/min (ref >=60)
Glucose, Bld: 118 mg/dL — ABNORMAL HIGH (ref 65–99)
Potassium: 4.3 mmol/L (ref 3.5–5.3)
Sodium: 139 mmol/L (ref 135–146)
Total Bilirubin: 0.4 mg/dL (ref 0.2–1.2)
Total Protein: 6.6 g/dL (ref 6.1–8.1)

## 2015-09-22 LAB — LIPID PANEL
CHOL/HDL RATIO: 5.5 ratio — AB (ref <=5.0)
CHOLESTEROL: 127 mg/dL (ref 125–200)
HDL: 23 mg/dL — ABNORMAL LOW (ref >=46)
LDL Cholesterol: 62 mg/dL (ref <130)
Triglycerides: 210 mg/dL — ABNORMAL HIGH (ref <150)
VLDL: 42 mg/dL — ABNORMAL HIGH (ref <30)

## 2015-09-22 MED ORDER — INSULIN GLARGINE 100 UNIT/ML ~~LOC~~ SOLN: SUBCUTANEOUS | Status: DC

## 2015-09-22 MED ORDER — DULOXETINE HCL 60 MG PO CPEP: ORAL_CAPSULE | ORAL | Status: DC

## 2015-09-22 NOTE — Patient Instructions (Addendum)
[place] offers smoking cessation clinics. Registration is required. To register call [phone] or register online at [email or web address].  You should receive a call or letter about your lab results within the next week to 10 days.   Diabetes borderline, but stable.   Some form of exercise or activity for most days of the week.   Recheck in 3 months.

## 2015-09-22 NOTE — Progress Notes (Addendum)
Subjective:  By signing my name below, I, Renee Ho, attest that this documentation has been prepared under the direction and in the presence of Renee StaggersJeffrey Adelia Baptista, MD.  Electronically Signed: Andrew Auaven Ho, ED Scribe. 09/22/2015. 11:27 AM.  Patient ID: Renee Ho, female    DOB: 08/11/1976, 39 y.o.   MRN: [id number]  HPI   Chief Complaint  Patient presents with  . Follow-up  . Diabetes  . Medication Refill    Cymbalta 60 mg, Lantus 100 unit/ML   HPI Comments: Renee Ho is a 39 y.o. female who presents to the [facility] for a follow up.   Diabetes She is on Januvia 100mg , metformin 1000mg  bid and Lantus 50 untits qhs  Wt Readings from Last 3 Encounters:  09/22/15 268 lb 3.2 oz (121.655 kg)  08/19/15 265 lb (120.203 kg)  06/24/15 268 lb (121.564 kg)   She has had issues of the hypoglycemia in the past when at work but at last visit had not had any recent episodes. She is on ace inhibitor.   Lab Results  Component Value Date   MICROALBUR 0.51 05/03/2014   Lab Results  Component Value Date   CHOL 146 02/13/2015   HDL 27* 02/13/2015   LDLCALC 75 02/13/2015   LDLDIRECT 129* 05/22/2013   TRIG 219* 02/13/2015   CHOLHDL 5.4 02/13/2015   She reports home reading have been under 200's. She denies low reading, lowest reading being 100 earlier this week, and hypoglycemic episodes. Pt is seen by Optho and dentist once a year.   Tobacco abuse Pt smokes .5 pack a day. She plans to start on nicotine patch the first on next year.   Depression She is on cymbalta 60 qd and states she is doing well at this dose. She enjoys spending time with her grandson. She denies thought of harming herself. Pt does not exercise.   Pt is fasting    Patient Active Problem List   Diagnosis Date Noted  . Injury of foot, left 07/18/2013  . Dysuria 05/22/2013  . Preventive measure 05/22/2013  . HTN (hypertension) 09/18/2012  . Morbid obesity (HCC) 09/18/2012  . S/P  hysterectomy 06/20/2012  . H/O colonoscopy with polypectomy 06/20/2012  . Thrush, oral 06/19/2012  . Diverticulitis 06/13/2012  . Depression 12/30/2011  . Plantar fasciitis 04/22/2011  . HYPERTRIGLYCERIDEMIA 02/21/2008  . DIABETES MELLITUS, TYPE II 02/24/2007  . POLYCYSTIC OVARIAN DISEASE 02/24/2007  . HYPERTENSION 02/24/2007  . Sleep apnea 02/24/2007   Past Medical History  Diagnosis Date  . Diabetes mellitus   . Diverticula of intestine   . High cholesterol   . Sleep apnea   . Diabetes mellitus without complication (HCC)   . Hypertension   . Polycystic ovarian syndrome   . Depression    Past Surgical History  Procedure Laterality Date  . Ovarian cyst removal    . Knee arthroscopy  07/11/2012    Procedure: ARTHROSCOPY KNEE;  Surgeon: Cammy CopaGregory Scott Dean, MD;  [facility];  Service: Orthopedics;  Laterality: Right;  Right knee arthroscopy with debridement  . Abdominal hysterectomy    . Cyst removal neck    . Knee surgery     Allergies  Allergen Reactions  . Metoclopramide Hcl     REACTION: Psychosis  . Morphine Other (See Comments)    migraine  . Morphine And Related Other (See Comments)    Migraines  . Reglan [Metoclopramide] Other (See Comments)    Psychotic episodes  . Toradol [  Ketorolac Tromethamine] Rash   Prior to Admission medications   Medication Sig Start Date End Date Taking? Authorizing Provider  aspirin EC 81 MG tablet Take 81 mg by mouth daily.   Yes Historical Provider, MD  atorvastatin (LIPITOR) 10 MG tablet Take 1 tablet (10 mg total) by mouth daily. 06/18/15  Yes Shade Flood, MD  DULoxetine (CYMBALTA) 60 MG capsule TAKE 1 CAPSULE BY MOUTH DAILY. 06/18/15  Yes Shade Flood, MD  hydrochlorothiazide (HYDRODIURIL) 25 MG tablet TAKE 1 TABLET BY MOUTH DAILY. 06/18/15  Yes Shade Flood, MD  insulin glargine (LANTUS) 100 UNIT/ML injection Inject 50 units into the skin at bedtime. 06/18/15  Yes Shade Flood, MD  JANUVIA 100 MG tablet TAKE 1 TABLET  BY MOUTH DAILY. 06/18/15  Yes Shade Flood, MD  lisinopril (PRINIVIL,ZESTRIL) 5 MG tablet TAKE 1 TABLET BY MOUTH ONCE DAILY 06/18/15  Yes Shade Flood, MD  metFORMIN (GLUCOPHAGE) 1000 MG tablet TAKE 1 TABLET (1,000 MG TOTAL) BY MOUTH 2 (TWO) TIMES DAILY WITH A MEAL. 06/18/15  Yes Shade Flood, MD  ondansetron (ZOFRAN-ODT) 8 MG disintegrating tablet Take 1 tablet (8 mg total) by mouth every 8 (eight) hours as needed for nausea. Patient not taking: Reported on 08/19/2015 08/03/15   Garnetta Buddy, PA   Social History   Social History  . Marital Status: Single    Spouse Name: N/A  . Number of Children: 1  . Years of Education: College   Occupational History  . Not on file.   Social History Main Topics  . Smoking status: Current Every Day Smoker -- 0.50 packs/day  . Smokeless tobacco: Not on file  . Alcohol Use: No     Comment: Rarely.  . Drug Use: No  . Sexual Activity: Not on file   Other Topics Concern  . Not on file   Social History Narrative   ** Merged History Encounter **   Drinks diet Dr. Reino Kent about 1 a day.        Review of Systems  Constitutional: Negative for fatigue and unexpected weight change.  Respiratory: Negative for chest tightness and shortness of breath.   Cardiovascular: Negative for chest pain, palpitations and leg swelling.  Gastrointestinal: Negative for abdominal pain and blood in stool.  Neurological: Negative for dizziness, syncope, light-headedness and headaches.   Objective:   Physical Exam  Constitutional: She is oriented to person, place, and time. She appears well-developed and well-nourished. No distress.  HENT:  Head: Normocephalic and atraumatic.  Eyes: Conjunctivae and EOM are normal. Pupils are equal, round, and reactive to light.  Neck: Neck supple. Carotid bruit is not present.  Cardiovascular: Normal rate, regular rhythm, normal heart sounds and intact distal pulses.   Pulmonary/Chest: Effort normal and breath sounds  normal.  Abdominal: Soft. She exhibits no pulsatile midline mass. There is no tenderness.  Musculoskeletal: Normal range of motion.  Neurological: She is alert and oriented to person, place, and time.  Skin: Skin is warm and dry.  Psychiatric: She has a normal mood and affect. Her behavior is normal.  Nursing note and vitals reviewed.  Filed Vitals:   09/22/15 1113  BP: 126/78  Pulse: 89  Temp: 99.2 F (37.3 C)  TempSrc: Oral  Resp: 16  Height:  (1.676 m)  Weight: 268 lb 3.2 oz (121.655 kg)  SpO2: 97%   Results for orders placed or performed in visit on 09/22/15  POCT glucose (manual entry)  Result Value Ref Range  POC Glucose 139 (A) 70 - 99 mg/dl  POCT glycosylated hemoglobin (Hb A1C)  Result Value Ref Range   Hemoglobin A1C 7.1     Assessment & Plan:   NEL Renee Ho is a 39 y.o. female Type 2 diabetes mellitus without complication, with long-term current use of insulin (HCC) - Plan: [facility] Diabetes Foot Exam, POCT glucose (manual entry), POCT glycosylated hemoglobin (Hb A1C), Microalbumin, urine, COMPLETE METABOLIC PANEL WITH GFR, Lipid panel, insulin glargine (LANTUS) 100 UNIT/ML injection  -Stable. Borderline A1c, but if can increase exercise and diet changes, suspect current medication regimen will be adequate. Recommended starting exercise/activity most days of the week as listed in her AVS. Recheck in 3 months.  Depression - Plan: DULoxetine (CYMBALTA) 60 MG capsule  -Overall controlled. Continue Cymbalta 60 mg a day  Tobacco abuse  -Importance of cessation discussed, especially with underlying diabetes. Resources were given for [place] smoking cessation clinic. She can let me know if my assistance is needed.  Meds ordered this encounter  Medications  . DULoxetine (CYMBALTA) 60 MG capsule    Sig: TAKE 1 CAPSULE BY MOUTH DAILY.    Dispense:  90 capsule    Refill:  1  . insulin glargine (LANTUS) 100 UNIT/ML injection    Sig: Inject 50 units into the skin  at bedtime.    Dispense:  20 mL    Refill:  3   Patient Instructions  [place] offers smoking cessation clinics. Registration is required. To register call [phone] or register online at [email or web address].  You should receive a call or letter about your lab results within the next week to 10 days.   Diabetes borderline, but stable.   Some form of exercise or activity for most days of the week.   Recheck in 3 months.      .I personally performed the services described in this documentation, which was scribed in my presence. The recorded information has been reviewed and considered, and addended by me as needed.

## 2015-09-23 LAB — MICROALBUMIN, URINE: Microalb, Ur: 0.3 mg/dL

## 2015-10-27 MED FILL — HYDROCHLOROTHIAZIDE 25 MG T: 25 | 90 days supply | Qty: 90 | Fill #1

## 2015-11-03 MED FILL — LANTUS 100 UNITS/ML VIAL: 100 | 40 days supply | Qty: 20 | Fill #1

## 2015-11-03 MED FILL — JANUVIA 100 MG TABLET: 100 | 90 days supply | Qty: 90 | Fill #1

## 2015-11-17 MED FILL — LISINOPRIL 5 MG TABLET: 5 | 90 days supply | Qty: 90 | Fill #1

## 2015-11-17 MED FILL — ATORVASTATIN 10 MG TABLET: 10 | 90 days supply | Qty: 90 | Fill #1

## 2015-11-25 ENCOUNTER — Encounter: Payer: Self-pay | Admitting: Pharmacist

## 2015-11-25 ENCOUNTER — Other Ambulatory Visit: Payer: Self-pay | Admitting: Family Medicine

## 2015-11-25 ENCOUNTER — Ambulatory Visit (INDEPENDENT_AMBULATORY_CARE_PROVIDER_SITE_OTHER): Payer: Self-pay | Admitting: Family Medicine

## 2015-11-25 VITALS — BP 124/76 | Wt 268.0 lb

## 2015-11-25 DIAGNOSIS — Z794 Long term (current) use of insulin: Secondary | ICD-10-CM

## 2015-11-25 DIAGNOSIS — E119 Type 2 diabetes mellitus without complications: Secondary | ICD-10-CM

## 2015-11-25 MED FILL — TRUE METRIX GLUCOSE TEST ST: 90 days supply | Qty: 100 | Fill #0

## 2015-11-25 MED FILL — TRUEplus LANCETS 30G MISC: 90 days supply | Qty: 100 | Fill #0

## 2015-11-25 MED FILL — metFORMIN HCL 1000 MG TABS: 1000 | 90 days supply | Qty: 180 | Fill #1

## 2015-11-25 NOTE — Patient Instructions (Signed)
1)  Continue exercising throughout the winter months! 2)  Always test glucose when symptomatic for a low 3)  Great job with diet, continue making improvements, specifically when eating out 4)  Continue to contemplate smoking cessation, attempt to quit by Wedding date 5)  Follow-up in 4 months on Tuesday June 27th @ 11:00 am  Great to see you today!

## 2015-11-25 NOTE — Progress Notes (Signed)
Subjective:  Patient presents today for 3 month diabetes follow-up as part of the employer-sponsored Link to Wellness program.  Current diabetes regimen includes metformin, Januvia, and Lantus.  Patient also continues on daily ASA, ACE Inhibitor and statin.  Most recent MD follow-up was Dec 2016.  Patient has follow-up scheduled for March 2017.  No med changes.  No major health changes.     Assessment:  Compliance good with diabetes meds.  Forgets only on occasion.    Not testing.  Tests only when symptomatic.  Will give new meter today, [employer]  Patient is a 40 yo female with DM type 2. Most recent A1C was unchanged at 7.1% which remains above goal of less than 7%.    Weight has remained unchanged since last visit.  Patient continues to maintain medication compliance and reports rarely missing a dose of her diabetes medications and has even improved compliance with other meds.  She is not testing regularly at this time, and tests only 1-2 times per month, mostly when symptomatic of a low.  She also reports her meter has not been working properly.  I have provided new [employer] meter and supplies today.  Hypoglycemia continues to occur on occasion, once in previous month.  Patient was symptomatic with flushing, dizziness, and shakiness.  Treated with sugary beverage (either OJ or cola).  Eye and dental exams up to date and normal.  Patient denies symptoms of neuropathy or infection.     Lifestyle:  Physical Activity-  No major changes at this time.  She continues walking her dog in neighborhood and at a local park, when weather permits. I have encouraged her to resume a more routine exercise regimen once weather improves.    Nutrition-  Patient is doing an excellent job with diet and has maintained weight loss.  She continues limiting carbs, including pasta, and attempting to make healthier choices.  She is also attempting to eat more fresh fruits and vegetables.  Reports snacking on and eating  fresh broccoli several times per week.  And is limiting snacks, although she does still enjoy fruit gummy snacks.    Smoking Cessation - continues smoking ~8-10 cig/day.  She has contemplated quitting and had set a goal for New Years, but a death in the family resulted in her postponing her quit plan.  She is getting married in June and would like to be smoke free by this day as she does not want to smoke in her wedding gown.  She will consider coming in for NRT if needed.  (Still has some left over from previous quit attempt).      Goals for Next Visit: 1)  Continue exercising throughout the winter months! 2)  Always test glucose when symptomatic for a low 3)  Great job with diet, continue making improvements, specifically when eating out 4)  Continue to contemplate smoking cessation, attempt to quit by Wedding date 5)  Follow-up in 4 months on Tuesday June 27th @ 11:00 am  Great to see you today!  Orlin Hilding, PharmD Link [facility]

## 2015-11-27 MED FILL — TRUEPLUS SYR 0.5ML 30GX5/16: 30G X 5/16" | 90 days supply | Qty: 100 | Fill #0

## 2015-12-04 NOTE — Progress Notes (Signed)
I have reviewed this pharmacist's note and agree  

## 2015-12-12 MED FILL — LANTUS 100 UNITS/ML VIAL: 100 | 40 days supply | Qty: 20 | Fill #2

## 2015-12-12 MED FILL — DULoxetine HCL 60 MG CPEP: 60 | 90 days supply | Qty: 90 | Fill #0

## 2015-12-22 ENCOUNTER — Ambulatory Visit: Payer: 59 | Admitting: Family Medicine

## 2015-12-24 ENCOUNTER — Ambulatory Visit: Payer: 59 | Admitting: Family Medicine

## 2016-01-16 MED FILL — JANUVIA 100 MG TABLET: 100 | 90 days supply | Qty: 90 | Fill #0

## 2016-01-16 MED FILL — LANTUS 100 UNITS/ML VIAL: 100 | 40 days supply | Qty: 20 | Fill #3

## 2016-01-16 MED FILL — HYDROCHLOROTHIAZIDE 25 MG T: 25 | 90 days supply | Qty: 90 | Fill #0

## 2016-02-19 ENCOUNTER — Other Ambulatory Visit: Payer: Self-pay | Admitting: Family Medicine

## 2016-02-23 MED FILL — LISINOPRIL 5 MG TABLET: 5 | 30 days supply | Qty: 30 | Fill #0

## 2016-02-25 MED FILL — LANTUS 100 UNITS/ML VIAL: 100 | 40 days supply | Qty: 20 | Fill #1

## 2016-03-07 ENCOUNTER — Emergency Department (HOSPITAL_COMMUNITY)
Admission: EM | Admit: 2016-03-07 | Discharge: 2016-03-07 | Disposition: A | Discharge status: Home / Self Care | Payer: 59 | Attending: Emergency Medicine | Admitting: Emergency Medicine

## 2016-03-07 ENCOUNTER — Emergency Department (HOSPITAL_COMMUNITY): Payer: 59

## 2016-03-07 ENCOUNTER — Encounter (HOSPITAL_COMMUNITY): Payer: Self-pay

## 2016-03-07 DIAGNOSIS — R519 Headache, unspecified: Secondary | ICD-10-CM

## 2016-03-07 DIAGNOSIS — Z794 Long term (current) use of insulin: Secondary | ICD-10-CM | POA: Insufficient documentation

## 2016-03-07 DIAGNOSIS — I10 Essential (primary) hypertension: Secondary | ICD-10-CM | POA: Diagnosis not present

## 2016-03-07 DIAGNOSIS — R51 Headache: Secondary | ICD-10-CM | POA: Diagnosis not present

## 2016-03-07 DIAGNOSIS — Z79899 Other long term (current) drug therapy: Secondary | ICD-10-CM | POA: Diagnosis not present

## 2016-03-07 DIAGNOSIS — F329 Major depressive disorder, single episode, unspecified: Secondary | ICD-10-CM | POA: Insufficient documentation

## 2016-03-07 DIAGNOSIS — E1165 Type 2 diabetes mellitus with hyperglycemia: Secondary | ICD-10-CM | POA: Insufficient documentation

## 2016-03-07 DIAGNOSIS — F17200 Nicotine dependence, unspecified, uncomplicated: Secondary | ICD-10-CM | POA: Insufficient documentation

## 2016-03-07 DIAGNOSIS — Z7984 Long term (current) use of oral hypoglycemic drugs: Secondary | ICD-10-CM | POA: Diagnosis not present

## 2016-03-07 DIAGNOSIS — R079 Chest pain, unspecified: Secondary | ICD-10-CM | POA: Diagnosis not present

## 2016-03-07 DIAGNOSIS — R0789 Other chest pain: Secondary | ICD-10-CM | POA: Diagnosis not present

## 2016-03-07 DIAGNOSIS — R112 Nausea with vomiting, unspecified: Secondary | ICD-10-CM | POA: Insufficient documentation

## 2016-03-07 LAB — I-STAT TROPONIN, ED
Troponin i, poc: 0 ng/mL (ref 0.00–0.08)
Troponin i, poc: 0 ng/mL (ref 0.00–0.08)

## 2016-03-07 LAB — CBC
HCT: 42.4 % (ref 36.0–46.0)
Hemoglobin: 14.2 g/dL (ref 12.0–15.0)
MCH: 30.6 pg (ref 26.0–34.0)
MCHC: 33.5 g/dL (ref 30.0–36.0)
MCV: 91.4 fL (ref 78.0–100.0)
Platelets: 157 10*3/uL (ref 150–400)
RBC: 4.64 MIL/uL (ref 3.87–5.11)
RDW: 13.6 % (ref 11.5–15.5)
WBC: 12.2 10*3/uL — ABNORMAL HIGH (ref 4.0–10.5)

## 2016-03-07 LAB — BASIC METABOLIC PANEL
Anion gap: 12 (ref 5–15)
BUN: 16 mg/dL (ref 6–20)
CALCIUM: 9.1 mg/dL (ref 8.9–10.3)
CO2: 25 mmol/L (ref 22–32)
Chloride: 99 mmol/L — ABNORMAL LOW (ref 101–111)
Creatinine, Ser: 0.75 mg/dL (ref 0.44–1.00)
GFR calc Af Amer: >60 mL/min (ref >60)
GLUCOSE: 318 mg/dL — AB (ref 65–99)
Potassium: 3.7 mmol/L (ref 3.5–5.1)
Sodium: 136 mmol/L (ref 135–145)

## 2016-03-07 MED ORDER — PROCHLORPERAZINE EDISYLATE 5 MG/ML IJ SOLN
10.0000 mg | Freq: Once | INTRAMUSCULAR | Status: AC
  Administered 2016-03-07: 10 mg via INTRAVENOUS
  Filled 2016-03-07: qty 2

## 2016-03-07 MED ORDER — HYDROMORPHONE HCL 1 MG/ML IJ SOLN
1.0000 mg | Freq: Once | INTRAMUSCULAR | Status: AC
  Administered 2016-03-07: 1 mg via INTRAVENOUS
  Filled 2016-03-07: qty 1

## 2016-03-07 MED ORDER — DIPHENHYDRAMINE HCL 50 MG/ML IJ SOLN
25.0000 mg | Freq: Once | INTRAMUSCULAR | Status: AC
  Administered 2016-03-07: 25 mg via INTRAVENOUS
  Filled 2016-03-07: qty 1

## 2016-03-07 NOTE — Discharge Instructions (Signed)
General Headache Without Cause °A headache is pain or discomfort felt around the head or neck area. The specific cause of a headache may not be found. There are many causes and types of headaches. A few common ones are: °· Tension headaches. °· Migraine headaches. °· Cluster headaches. °· Chronic daily headaches. °HOME CARE INSTRUCTIONS  °Watch your condition for any changes. Take these steps to help with your condition: °Managing Pain °· Take over-the-counter and prescription medicines only as told by your health care provider. °· Lie down in a dark, quiet room when you have a headache. °· If directed, apply ice to the head and neck area: °· Put ice in a plastic bag. °· Place a towel between your skin and the bag. °· Leave the ice on for 20 minutes, 2-3 times per day. °· Use a heating pad or hot shower to apply heat to the head and neck area as told by your health care provider. °· Keep lights dim if bright lights bother you or make your headaches worse. °Eating and Drinking °· Eat meals on a regular schedule. °· Limit alcohol use. °· Decrease the amount of caffeine you drink, or stop drinking caffeine. °General Instructions °· Keep all follow-up visits as told by your health care provider. This is important. °· Keep a headache journal to help find out what may trigger your headaches. For example, write down: °· What you eat and drink. °· How much sleep you get. °· Any change to your diet or medicines. °· Try massage or other relaxation techniques. °· Limit stress. °· Sit up straight, and do not tense your muscles. °· Do not use tobacco products, including cigarettes, chewing tobacco, or e-cigarettes. If you need help quitting, ask your health care provider. °· Exercise regularly as told by your health care provider. °· Sleep on a regular schedule. Get 7-9 hours of sleep, or the amount recommended by your health care provider. °SEEK MEDICAL CARE IF:  °· Your symptoms are not helped by medicine. °· You have a  headache that is different from the usual headache. °· You have nausea or you vomit. °· You have a fever. °SEEK IMMEDIATE MEDICAL CARE IF:  °· Your headache becomes severe. °· You have repeated vomiting. °· You have a stiff neck. °· You have a loss of vision. °· You have problems with speech. °· You have pain in the eye or ear. °· You have muscular weakness or loss of muscle control. °· You lose your balance or have trouble walking. °· You feel faint or pass out. °· You have confusion. °  °This information is not intended to replace advice given to you by your health care provider. Make sure you discuss any questions you have with your health care provider. °  °Document Released: 09/27/2005 Document Revised: 06/18/2015 Document Reviewed: 01/20/2015 °Elsevier Interactive Patient Education ©2016 [employer] ° °Nonspecific Chest Pain  °Chest pain can be caused by many different conditions. There is always a chance that your pain could be related to something serious, such as a heart attack or a blood clot in your lungs. Chest pain can also be caused by conditions that are not life-threatening. If you have chest pain, it is very important to follow up with your health care provider. °CAUSES  °Chest pain can be caused by: °· Heartburn. °· Pneumonia or bronchitis. °· Anxiety or stress. °· Inflammation around your heart (pericarditis) or lung (pleuritis or pleurisy). °· A blood clot in your lung. °· A collapsed   lung (pneumothorax). It can develop suddenly on its own (spontaneous pneumothorax) or from trauma to the chest. °· Shingles infection (varicella-zoster virus). °· Heart attack. °· Damage to the bones, muscles, and cartilage that make up your chest wall. This can include: °¨ Bruised bones due to injury. °¨ Strained muscles or cartilage due to frequent or repeated coughing or overwork. °¨ Fracture to one or more ribs. °¨ Sore cartilage due to inflammation (costochondritis). °RISK FACTORS  °Risk factors for chest  pain may include: °· Activities that increase your risk for trauma or injury to your chest. °· Respiratory infections or conditions that cause frequent coughing. °· Medical conditions or overeating that can cause heartburn. °· Heart disease or family history of heart disease. °· Conditions or health behaviors that increase your risk of developing a blood clot. °· Having had chicken pox (varicella zoster). °SIGNS AND SYMPTOMS °Chest pain can feel like: °· Burning or tingling on the surface of your chest or deep in your chest. °· Crushing, pressure, aching, or squeezing pain. °· Dull or sharp pain that is worse when you move, cough, or take a deep breath. °· Pain that is also felt in your back, neck, shoulder, or arm, or pain that spreads to any of these areas. °Your chest pain may come and go, or it may stay constant. °DIAGNOSIS °Lab tests or other studies may be needed to find the cause of your pain. Your health care provider may have you take a test called an ambulatory ECG (electrocardiogram). An ECG records your heartbeat patterns at the time the test is performed. You may also have other tests, such as: °· Transthoracic echocardiogram (TTE). During echocardiography, sound waves are used to create a picture of all of the heart structures and to look at how blood flows through your heart. °· Transesophageal echocardiogram (TEE). This is a more advanced imaging test that obtains images from inside your body. It allows your health care provider to see your heart in finer detail. °· Cardiac monitoring. This allows your health care provider to monitor your heart rate and rhythm in real time. °· Holter monitor. This is a portable device that records your heartbeat and can help to diagnose abnormal heartbeats. It allows your health care provider to track your heart activity for several days, if needed. °· Stress tests. These can be done through exercise or by taking medicine that makes your heart beat more  quickly. °· Blood tests. °· Imaging tests. °TREATMENT  °Your treatment depends on what is causing your chest pain. Treatment may include: °· Medicines. These may include: °¨ Acid blockers for heartburn. °¨ Anti-inflammatory medicine. °¨ Pain medicine for inflammatory conditions. °¨ Antibiotic medicine, if an infection is present. °¨ Medicines to dissolve blood clots. °¨ Medicines to treat coronary artery disease. °· Supportive care for conditions that do not require medicines. This may include: °¨ Resting. °¨ Applying heat or cold packs to injured areas. °¨ Limiting activities until pain decreases. °HOME CARE INSTRUCTIONS °· If you were prescribed an antibiotic medicine, finish it all even if you start to feel better. °· Avoid any activities that bring on chest pain. °· Do not use any tobacco products, including cigarettes, chewing tobacco, or electronic cigarettes. If you need help quitting, ask your health care provider. °· Do not drink alcohol. °· Take medicines only as directed by your health care provider. °· Keep all follow-up visits as directed by your health care provider. This is important. This includes any further testing if your chest pain   does not go away. °· If heartburn is the cause for your chest pain, you may be told to keep your head raised (elevated) while sleeping. This reduces the chance that acid will go from your stomach into your esophagus. °· Make lifestyle changes as directed by your health care provider. These may include: °¨ Getting regular exercise. Ask your health care provider to suggest some activities that are safe for you. °¨ Eating a heart-healthy diet. A registered dietitian can help you to learn healthy eating options. °¨ Maintaining a healthy weight. °¨ Managing diabetes, if necessary. °¨ Reducing stress. °SEEK MEDICAL CARE IF: °· Your chest pain does not go away after treatment. °· You have a rash with blisters on your chest. °· You have a fever. °SEEK IMMEDIATE MEDICAL CARE  IF:  °· Your chest pain is worse. °· You have an increasing cough, or you cough up blood. °· You have severe abdominal pain. °· You have severe weakness. °· You faint. °· You have chills. °· You have sudden, unexplained chest discomfort. °· You have sudden, unexplained discomfort in your arms, back, neck, or jaw. °· You have shortness of breath at any time. °· You suddenly start to sweat, or your skin gets clammy. °· You feel nauseous or you vomit. °· You suddenly feel light-headed or dizzy. °· Your heart begins to beat quickly, or it feels like it is skipping beats. °These symptoms may represent a serious problem that is an emergency. Do not wait to see if the symptoms will go away. Get medical help right away. Call your local emergency services (911 in the [place] Do not drive yourself to the hospital. °  °This information is not intended to replace advice given to you by your health care provider. Make sure you discuss any questions you have with your health care provider. °  °Document Released: 07/07/2005 Document Revised: 10/18/2014 Document Reviewed: 05/03/2014 °Elsevier Interactive Patient Education ©2016 [employer] ° °

## 2016-03-07 NOTE — ED Provider Notes (Signed)
CSN: [id number]     Arrival date & time 03/07/16  0053 History  By signing my name below, I, Ronney Lion, attest that this documentation has been prepared under the direction and in the presence of Gilda Crease, MD. Electronically Signed: Ronney Lion, ED Scribe. 03/07/2016. 2:24 AM.   Chief Complaint  Patient presents with  . Chest Pain  . Hyperglycemia   The history is provided by the patient. No language interpreter was used.    HPI Comments: Renee Ho is a 40 y.o. female with a history of DM, diverticula of intestine, high cholesterol, hypertension, and DM, brought in by ambulance, who presents to the Emergency Department complaining of constant, improving, heavy chest pain that began about 5 hours ago, at 9 PM. She reports her chest pain was also followed by associated nausea, 2 episodes of vomiting, headache, and numbness in her left fingers. She states she had been feeling well earlier today before her symptoms onset. Patient reports she was given NTG by EMS, which did not improve her symptoms. However, she states her pain improved with time. Nothing else seemed to improve her symptoms.She states she is still experiencing chest pain presently. She denies a history of MI. She denies SOB. Patient reports a history of DM; she states her sugars have recently been well-controlled.    Past Medical History  Diagnosis Date  . Diabetes mellitus   . Diverticula of intestine   . High cholesterol   . Sleep apnea   . Diabetes mellitus without complication (HCC)   . Hypertension   . Polycystic ovarian syndrome   . Depression    Past Surgical History  Procedure Laterality Date  . Ovarian cyst removal    . Knee arthroscopy  07/11/2012    Procedure: ARTHROSCOPY KNEE;  Surgeon: Cammy Copa, MD;  Location: [facility] OR;  Service: Orthopedics;  Laterality: Right;  Right knee arthroscopy with debridement  . Abdominal hysterectomy    . Cyst removal neck    . Knee surgery     Family  History  Problem Relation Age of Onset  . Cancer Mother   . Mental illness Mother   . Cancer Maternal Grandfather   . Stroke Paternal Grandmother    Social History  Substance Use Topics  . Smoking status: Current Every Day Smoker -- 0.50 packs/day  . Smokeless tobacco: None  . Alcohol Use: No     Comment: Rarely.   OB History    Gravida Para Term Preterm AB TAB SAB Ectopic Multiple Living   0 0 0 0 0 0 0 0       Review of Systems  Respiratory: Negative for shortness of breath.   Cardiovascular: Positive for chest pain.  Gastrointestinal: Positive for nausea and vomiting.  Neurological: Positive for numbness and headaches.  All other systems reviewed and are negative.     Allergies  Metoclopramide hcl; Morphine; Morphine and related; Reglan; and Toradol  Home Medications   Prior to Admission medications   Medication Sig Start Date End Date Taking? Authorizing Provider  atorvastatin (LIPITOR) 10 MG tablet TAKE 1 TABLET BY MOUTH DAILY. 11/26/15  Yes Shade Flood, MD  DULoxetine (CYMBALTA) 60 MG capsule TAKE 1 CAPSULE BY MOUTH DAILY. 09/22/15  Yes Shade Flood, MD  hydrochlorothiazide (HYDRODIURIL) 25 MG tablet TAKE 1 TABLET BY MOUTH DAILY. 11/26/15  Yes Shade Flood, MD  insulin glargine (LANTUS) 100 UNIT/ML injection Inject 50 units into the skin at bedtime. 09/22/15  Yes Tinnie Gens  Earnestine Leys Greene, MD  JANUVIA 100 MG tablet TAKE 1 TABLET BY MOUTH DAILY. 11/26/15  Yes Shade FloodJeffrey R Greene, MD  lisinopril (PRINIVIL,ZESTRIL) 5 MG tablet TAKE 1 TABLET BY MOUTH ONCE DAILY 02/23/16  Yes Shade FloodJeffrey R Greene, MD  metFORMIN (GLUCOPHAGE) 1000 MG tablet TAKE 1 TABLET (1,000 MG TOTAL) BY MOUTH 2 (TWO) TIMES DAILY WITH A MEAL. 06/18/15  Yes Shade FloodJeffrey R Greene, MD  TRUEPLUS INSULIN SYRINGE 30G X 5/16" 0.5 ML MISC USE AS DIRECTED EVERY DAY 11/26/15  Yes Shade FloodJeffrey R Greene, MD   BP 100/59 mmHg  Pulse 73  Temp(Src) 98.2 F (36.8 C)  Resp 22  Ht 5\' 5"  (1.651 m)  Wt 270 lb (122.471 kg)  BMI 44.93  kg/m2  SpO2 97% Physical Exam  Constitutional: She is oriented to person, place, and time. She appears well-developed and well-nourished. No distress.  HENT:  Head: Normocephalic and atraumatic.  Right Ear: Hearing normal.  Left Ear: Hearing normal.  Nose: Nose normal.  Mouth/Throat: Oropharynx is clear and moist and mucous membranes are normal.  Eyes: Conjunctivae and EOM are normal. Pupils are equal, round, and reactive to light.  Neck: Normal range of motion. Neck supple.  Cardiovascular: Regular rhythm, S1 normal and S2 normal.  Exam reveals no gallop and no friction rub.   Murmur heard. 1/6 systolic murmur at the apex.   Pulmonary/Chest: Effort normal and breath sounds normal. No respiratory distress. She exhibits no tenderness.  Abdominal: Soft. Normal appearance and bowel sounds are normal. There is no hepatosplenomegaly. There is no tenderness. There is no rebound, no guarding, no tenderness at McBurney's point and negative Murphy's sign. No hernia.  Musculoskeletal: Normal range of motion.  Neurological: She is alert and oriented to person, place, and time. She has normal strength. No cranial nerve deficit or sensory deficit. Coordination normal. GCS eye subscore is 4. GCS verbal subscore is 5. GCS motor subscore is 6.  Skin: Skin is warm, dry and intact. No rash noted. No cyanosis.  Psychiatric: She has a normal mood and affect. Her speech is normal and behavior is normal. Thought content normal.  Nursing note and vitals reviewed.   ED Course  Procedures (including critical care time)  DIAGNOSTIC STUDIES: Oxygen Saturation is 94% on RA, normal by my interpretation.    COORDINATION OF CARE: 1:42 AM - Discussed treatment plan with pt at bedside. Pt verbalized understanding and agreed to plan.   Labs Review Labs Reviewed  BASIC METABOLIC PANEL - Abnormal; Notable for the following:    Chloride 99 (*)    Glucose, Bld 318 (*)    All other components within normal limits   CBC - Abnormal; Notable for the following:    WBC 12.2 (*)    All other components within normal limits  I-STAT TROPOININ, ED  Rosezena SensorI-STAT TROPOININ, ED    Imaging Review Dg Chest 2 View  03/07/2016  CLINICAL DATA:  Acute onset of generalized chest pain and shortness of breath. Initial encounter. EXAM: CHEST  2 VIEW COMPARISON:  Chest radiograph performed 07/11/2013 FINDINGS: The lungs are well-aerated. Mild bibasilar opacities may reflect atelectasis or possibly mild infection. There is no evidence of pleural effusion or pneumothorax. The heart is normal in size; the mediastinal contour is within normal limits. No acute osseous abnormalities are seen. IMPRESSION: Mild bibasilar opacities may reflect atelectasis or possibly mild infection. Electronically Signed   By: Roanna RaiderJeffery  Chang M.D.   On: 03/07/2016 04:31   I have personally reviewed and evaluated these images and  lab results as part of my medical decision-making.   EKG Interpretation   Date/Time:  Sunday Mar 07 2016 01:04:41 EDT Ventricular Rate:  89 PR Interval:  151 QRS Duration: 89 QT Interval:  395 QTC Calculation: 481 R Axis:   46 Text Interpretation:  Sinus rhythm Normal ECG Confirmed by Elica Almas  MD,  Valory Wetherby ([id number]) on 03/07/2016 3:13:53 AM      MDM   Final diagnoses:  None  Headache Chest pain  Patient presents to the emergency department for evaluation of chest pain. Patient reports that she had onset of a tightness in her chest followed by nausea and vomiting approximately one hour prior to arrival. Patient reported 8 out of 10 pain initially. She was given aspirin and nitroglycerin by EMS but did not have improvement. She was given Zofran and nausea only slightly improved. She was complaining of a headache at arrival. She does have a history of recurrent headaches. This was not an unusual headache for her. Patient was given Reglan and Benadryl as well as Dilaudid. Chest pain and headache resolved. Patient has had 2  normal troponins. She has a normal EKG. Although she does have multiple cardiac risk factors, she is felt to be lower risk and it is felt that this workup is appropriate for ruling out ACS and she can have further evaluation as an outpatient. She can contact her primary care physician and will be given direct referral to cardiology.  I personally performed the services described in this documentation, which was scribed in my presence. The recorded information has been reviewed and is accurate.     Gilda Crease, MD 03/07/16 [phone]

## 2016-03-07 NOTE — ED Notes (Signed)
[facility], started having chest tightness about an hour ago, vomited twice. Pain 8/10, no radiation. PTA 324 ASA, one nitro, 4mg  zofran.

## 2016-03-17 MED FILL — ATORVASTATIN 10 MG TABLET: 10 | 90 days supply | Qty: 90 | Fill #0

## 2016-03-17 MED FILL — DULoxetine HCL 60 MG CPEP: 60 | 90 days supply | Qty: 90 | Fill #1

## 2016-03-25 ENCOUNTER — Other Ambulatory Visit: Payer: Self-pay | Admitting: Family Medicine

## 2016-03-25 MED FILL — LISINOPRIL 5 MG TABLET: 5 | 30 days supply | Qty: 30 | Fill #0

## 2016-04-05 ENCOUNTER — Encounter (HOSPITAL_COMMUNITY): Payer: Self-pay | Admitting: Emergency Medicine

## 2016-04-05 ENCOUNTER — Emergency Department (HOSPITAL_COMMUNITY): Payer: 59

## 2016-04-05 ENCOUNTER — Emergency Department (HOSPITAL_COMMUNITY)
Admission: EM | Admit: 2016-04-05 | Discharge: 2016-04-06 | Disposition: A | Discharge status: Home / Self Care | Payer: 59 | Attending: Emergency Medicine | Admitting: Emergency Medicine

## 2016-04-05 DIAGNOSIS — S8992XA Unspecified injury of left lower leg, initial encounter: Secondary | ICD-10-CM | POA: Diagnosis not present

## 2016-04-05 DIAGNOSIS — R519 Headache, unspecified: Secondary | ICD-10-CM

## 2016-04-05 DIAGNOSIS — Y939 Activity, unspecified: Secondary | ICD-10-CM | POA: Diagnosis not present

## 2016-04-05 DIAGNOSIS — Y999 Unspecified external cause status: Secondary | ICD-10-CM | POA: Diagnosis not present

## 2016-04-05 DIAGNOSIS — I10 Essential (primary) hypertension: Secondary | ICD-10-CM | POA: Diagnosis not present

## 2016-04-05 DIAGNOSIS — S81811A Laceration without foreign body, right lower leg, initial encounter: Secondary | ICD-10-CM | POA: Diagnosis not present

## 2016-04-05 DIAGNOSIS — F17200 Nicotine dependence, unspecified, uncomplicated: Secondary | ICD-10-CM | POA: Diagnosis not present

## 2016-04-05 DIAGNOSIS — R079 Chest pain, unspecified: Secondary | ICD-10-CM | POA: Diagnosis not present

## 2016-04-05 DIAGNOSIS — Z794 Long term (current) use of insulin: Secondary | ICD-10-CM | POA: Diagnosis not present

## 2016-04-05 DIAGNOSIS — M79662 Pain in left lower leg: Secondary | ICD-10-CM | POA: Diagnosis not present

## 2016-04-05 DIAGNOSIS — M79671 Pain in right foot: Secondary | ICD-10-CM

## 2016-04-05 DIAGNOSIS — Y92410 Unspecified street and highway as the place of occurrence of the external cause: Secondary | ICD-10-CM | POA: Diagnosis not present

## 2016-04-05 DIAGNOSIS — E119 Type 2 diabetes mellitus without complications: Secondary | ICD-10-CM | POA: Diagnosis not present

## 2016-04-05 DIAGNOSIS — R51 Headache: Secondary | ICD-10-CM | POA: Diagnosis not present

## 2016-04-05 DIAGNOSIS — T148 Other injury of unspecified body region: Secondary | ICD-10-CM | POA: Diagnosis not present

## 2016-04-05 DIAGNOSIS — R0789 Other chest pain: Secondary | ICD-10-CM | POA: Diagnosis not present

## 2016-04-05 DIAGNOSIS — Z7984 Long term (current) use of oral hypoglycemic drugs: Secondary | ICD-10-CM | POA: Diagnosis not present

## 2016-04-05 DIAGNOSIS — S8991XA Unspecified injury of right lower leg, initial encounter: Secondary | ICD-10-CM | POA: Diagnosis present

## 2016-04-05 DIAGNOSIS — M79604 Pain in right leg: Secondary | ICD-10-CM

## 2016-04-05 DIAGNOSIS — T07 Unspecified multiple injuries: Secondary | ICD-10-CM | POA: Diagnosis not present

## 2016-04-05 DIAGNOSIS — M79661 Pain in right lower leg: Secondary | ICD-10-CM | POA: Diagnosis not present

## 2016-04-05 DIAGNOSIS — Z79899 Other long term (current) drug therapy: Secondary | ICD-10-CM | POA: Insufficient documentation

## 2016-04-05 LAB — BASIC METABOLIC PANEL
Anion gap: 8 (ref 5–15)
BUN: 13 mg/dL (ref 6–20)
CHLORIDE: 102 mmol/L (ref 101–111)
CO2: 25 mmol/L (ref 22–32)
CREATININE: 0.72 mg/dL (ref 0.44–1.00)
Calcium: 9.1 mg/dL (ref 8.9–10.3)
GFR calc Af Amer: >60 mL/min (ref >60)
GFR calc non Af Amer: >60 mL/min (ref >60)
GLUCOSE: 245 mg/dL — AB (ref 65–99)
Potassium: 3.6 mmol/L (ref 3.5–5.1)
SODIUM: 135 mmol/L (ref 135–145)

## 2016-04-05 LAB — CBC WITH DIFFERENTIAL/PLATELET
Basophils Absolute: 0 10*3/uL (ref 0.0–0.1)
Basophils Relative: 0 %
EOS ABS: 0.2 10*3/uL (ref 0.0–0.7)
Eosinophils Relative: 3 %
HCT: 41.4 % (ref 36.0–46.0)
HEMOGLOBIN: 13.9 g/dL (ref 12.0–15.0)
LYMPHS ABS: 2.5 10*3/uL (ref 0.7–4.0)
LYMPHS PCT: 28 %
MCH: 30.1 pg (ref 26.0–34.0)
MCHC: 33.6 g/dL (ref 30.0–36.0)
MCV: 89.6 fL (ref 78.0–100.0)
MONOS PCT: 6 %
Monocytes Absolute: 0.6 10*3/uL (ref 0.1–1.0)
NEUTROS PCT: 63 %
Neutro Abs: 5.7 10*3/uL (ref 1.7–7.7)
Platelets: 171 10*3/uL (ref 150–400)
RBC: 4.62 MIL/uL (ref 3.87–5.11)
RDW: 13.4 % (ref 11.5–15.5)
WBC: 8.9 10*3/uL (ref 4.0–10.5)

## 2016-04-05 MED ORDER — ONDANSETRON HCL 4 MG/2ML IJ SOLN
4.0000 mg | Freq: Once | INTRAMUSCULAR | Status: AC
  Administered 2016-04-05: 4 mg via INTRAVENOUS
  Filled 2016-04-05: qty 2

## 2016-04-05 MED ORDER — ACETAMINOPHEN 500 MG PO TABS
1000.0000 mg | ORAL_TABLET | Freq: Once | ORAL | Status: AC
  Administered 2016-04-06: 1000 mg via ORAL
  Filled 2016-04-05: qty 2

## 2016-04-05 MED ORDER — HYDROMORPHONE HCL 1 MG/ML IJ SOLN
1.0000 mg | Freq: Once | INTRAMUSCULAR | Status: AC
  Administered 2016-04-05: 1 mg via INTRAVENOUS
  Filled 2016-04-05: qty 1

## 2016-04-05 MED ORDER — IOPAMIDOL (ISOVUE-300) INJECTION 61%
INTRAVENOUS | Status: AC
  Administered 2016-04-05: 100 mL
  Filled 2016-04-05: qty 100

## 2016-04-05 MED ORDER — FENTANYL CITRATE (PF) 100 MCG/2ML IJ SOLN
50.0000 ug | Freq: Once | INTRAMUSCULAR | Status: AC
  Administered 2016-04-06: 50 ug via INTRAVENOUS
  Filled 2016-04-05: qty 2
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
## 2016-04-06 ENCOUNTER — Ambulatory Visit: Payer: Self-pay | Admitting: Pharmacist

## 2016-04-06 DIAGNOSIS — S81811A Laceration without foreign body, right lower leg, initial encounter: Secondary | ICD-10-CM | POA: Diagnosis not present

## 2016-04-06 DIAGNOSIS — Z79899 Other long term (current) drug therapy: Secondary | ICD-10-CM | POA: Diagnosis not present

## 2016-04-06 DIAGNOSIS — Z7984 Long term (current) use of oral hypoglycemic drugs: Secondary | ICD-10-CM | POA: Diagnosis not present

## 2016-04-06 DIAGNOSIS — F17200 Nicotine dependence, unspecified, uncomplicated: Secondary | ICD-10-CM | POA: Diagnosis not present

## 2016-04-06 DIAGNOSIS — I10 Essential (primary) hypertension: Secondary | ICD-10-CM | POA: Diagnosis not present

## 2016-04-06 DIAGNOSIS — R079 Chest pain, unspecified: Secondary | ICD-10-CM | POA: Diagnosis not present

## 2016-04-06 DIAGNOSIS — Z794 Long term (current) use of insulin: Secondary | ICD-10-CM | POA: Diagnosis not present

## 2016-04-06 DIAGNOSIS — E119 Type 2 diabetes mellitus without complications: Secondary | ICD-10-CM | POA: Diagnosis not present

## 2016-04-06 DIAGNOSIS — R51 Headache: Secondary | ICD-10-CM | POA: Diagnosis not present

## 2016-04-06 MED ORDER — OXYCODONE HCL 5 MG PO TABS
10.0000 mg | ORAL_TABLET | Freq: Once | ORAL | Status: AC
  Administered 2016-04-06: 10 mg via ORAL
  Filled 2016-04-06: qty 2

## 2016-04-06 MED ORDER — METHOCARBAMOL 500 MG PO TABS: 500.0000 mg | ORAL_TABLET | Freq: Three times a day (TID) | ORAL | Status: DC | PRN

## 2016-04-06 MED ORDER — IBUPROFEN 600 MG PO TABS: 600.0000 mg | ORAL_TABLET | Freq: Three times a day (TID) | ORAL | Status: DC | PRN

## 2016-04-06 MED ORDER — HYDROCODONE-ACETAMINOPHEN 5-325 MG PO TABS: 1.0000 | ORAL_TABLET | ORAL | Status: DC | PRN

## 2016-04-06 MED FILL — IBUPROFEN 600 MG TABLET: 600 | 5 days supply | Qty: 15 | Fill #0

## 2016-04-06 MED FILL — METHOCARBAMOL 500 MG TABLET: 500 | 4 days supply | Qty: 12 | Fill #0

## 2016-04-06 MED FILL — HYDROCODON-APAP 5-325: 5-325 | 2 days supply | Qty: 12 | Fill #0

## 2016-04-06 NOTE — ED Notes (Signed)
Patient able to ambulate independently with crutches 

## 2016-04-06 NOTE — Discharge Instructions (Signed)

## 2016-04-06 NOTE — ED Provider Notes (Signed)
Imaging without acute abnormality.  Patient still complaining of pain in the medial aspect of her right foot.  This area was examined.  She has normal pulses in her right foot.  Review of the x-ray of the right foot is normal.  She'll be placed in a weightbearing as tolerated status.  Crutches.  Orthopedic follow-up.  She understands return to the ER for new or worsening symptoms.  Repeat abdominal exam is without tenderness at this time.  Azalia BilisKevin Charliee Krenz, MD 04/06/16 [phone]

## 2016-04-09 DIAGNOSIS — M25571 Pain in right ankle and joints of right foot: Secondary | ICD-10-CM | POA: Diagnosis not present

## 2016-04-09 MED FILL — LANTUS 100 UNITS/ML VIAL: 100 | 40 days supply | Qty: 20 | Fill #2

## 2016-04-16 DIAGNOSIS — M25571 Pain in right ankle and joints of right foot: Secondary | ICD-10-CM | POA: Diagnosis not present

## 2016-04-22 DIAGNOSIS — M25561 Pain in right knee: Secondary | ICD-10-CM | POA: Diagnosis not present

## 2016-04-22 DIAGNOSIS — M25571 Pain in right ankle and joints of right foot: Secondary | ICD-10-CM | POA: Diagnosis not present

## 2016-04-26 DIAGNOSIS — M25561 Pain in right knee: Secondary | ICD-10-CM | POA: Diagnosis not present

## 2016-05-17 DIAGNOSIS — M25561 Pain in right knee: Secondary | ICD-10-CM | POA: Diagnosis not present

## 2016-05-27 ENCOUNTER — Encounter: Payer: Self-pay | Admitting: Family Medicine

## 2016-05-27 ENCOUNTER — Ambulatory Visit (INDEPENDENT_AMBULATORY_CARE_PROVIDER_SITE_OTHER): Payer: 59 | Admitting: Family Medicine

## 2016-05-27 VITALS — BP 148/86 | HR 83 | Temp 98.7°F | Resp 16 | Ht 66.0 in | Wt 274.8 lb

## 2016-05-27 DIAGNOSIS — F32A Depression, unspecified: Secondary | ICD-10-CM

## 2016-05-27 DIAGNOSIS — H6011 Cellulitis of right external ear: Secondary | ICD-10-CM | POA: Diagnosis not present

## 2016-05-27 DIAGNOSIS — F329 Major depressive disorder, single episode, unspecified: Secondary | ICD-10-CM | POA: Diagnosis not present

## 2016-05-27 DIAGNOSIS — H60393 Other infective otitis externa, bilateral: Secondary | ICD-10-CM

## 2016-05-27 MED ORDER — CEPHALEXIN 500 MG PO CAPS: 500.0000 mg | ORAL_CAPSULE | Freq: Two times a day (BID) | ORAL | 0 refills | Status: DC

## 2016-05-27 MED ORDER — OFLOXACIN 0.3 % OT SOLN: 10.0000 [drp] | Freq: Every day | OTIC | 0 refills | Status: DC

## 2016-05-27 MED ORDER — "INSULIN SYRINGE-NEEDLE U-100 30G X 5/16"" 0.5 ML MISC": 99 refills | Status: DC

## 2016-05-27 MED FILL — ULTICARE SYR 0.5 ML 30GX5/1: 30G X 5/16" | 90 days supply | Qty: 100 | Fill #0

## 2016-05-27 MED FILL — CEPHALEXIN 500 MG CAPSULE: 500 | 10 days supply | Qty: 20 | Fill #0

## 2016-05-27 MED FILL — OFLOXACIN 0.3% EAR DROPS: 0.3 | 15 days supply | Qty: 10 | Fill #0

## 2016-05-27 NOTE — Patient Instructions (Addendum)
You appear to have an outer ear infection that may be causing a cellulitis or infection of the ear itself. Start the oral antibiotic twice per day, as well as the drops in both ears once per day. Recheck in 48 hours, or sooner if worsening symptoms. Fasting visit in 2 days to discuss your other medications.  Return to the clinic or go to the nearest emergency room if any of your symptoms worsen or new symptoms occur.   Cellulitis Cellulitis is an infection of the skin and the tissue beneath it. The infected area is usually red and tender. Cellulitis occurs most often in the arms and lower legs.  CAUSES  Cellulitis is caused by bacteria that enter the skin through cracks or cuts in the skin. The most common types of bacteria that cause cellulitis are staphylococci and streptococci. SIGNS AND SYMPTOMS   Redness and warmth.  Swelling.  Tenderness or pain.  Fever. DIAGNOSIS  Your health care provider can usually determine what is wrong based on a physical exam. Blood tests may also be done. TREATMENT  Treatment usually involves taking an antibiotic medicine. HOME CARE INSTRUCTIONS   Take your antibiotic medicine as directed by your health care provider. Finish the antibiotic even if you start to feel better.  Keep the infected arm or leg elevated to reduce swelling.  Apply a warm cloth to the affected area up to 4 times per day to relieve pain.  Take medicines only as directed by your health care provider.  Keep all follow-up visits as directed by your health care provider. SEEK MEDICAL CARE IF:   You notice red streaks coming from the infected area.  Your red area gets larger or turns dark in color.  Your bone or joint underneath the infected area becomes painful after the skin has healed.  Your infection returns in the same area or another area.  You notice a swollen bump in the infected area.  You develop new symptoms.  You have a fever. SEEK IMMEDIATE MEDICAL CARE IF:    You feel very sleepy.  You develop vomiting or diarrhea.  You have a general ill feeling (malaise) with muscle aches and pains.   This information is not intended to replace advice given to you by your health care provider. Make sure you discuss any questions you have with your health care provider.   Document Released: 07/07/2005 Document Revised: 06/18/2015 Document Reviewed: 12/13/2011 Elsevier Interactive Patient Education 2016 [employer] Inc.  Otitis Externa Otitis externa is a bacterial or fungal infection of the outer ear canal. This is the area from the eardrum to the outside of the ear. Otitis externa is sometimes called "swimmer's ear." CAUSES  Possible causes of infection include:  Swimming in dirty water.  Moisture remaining in the ear after swimming or bathing.  Mild injury (trauma) to the ear.  Objects stuck in the ear (foreign body).  Cuts or scrapes (abrasions) on the outside of the ear. SIGNS AND SYMPTOMS  The first symptom of infection is often itching in the ear canal. Later signs and symptoms may include swelling and redness of the ear canal, ear pain, and yellowish-white fluid (pus) coming from the ear. The ear pain may be worse when pulling on the earlobe. DIAGNOSIS  Your health care provider will perform a physical exam. A sample of fluid may be taken from the ear and examined for bacteria or fungi. TREATMENT  Antibiotic ear drops are often given for 10 to 14 days. Treatment may also  include pain medicine or corticosteroids to reduce itching and swelling. HOME CARE INSTRUCTIONS   Apply antibiotic ear drops to the ear canal as prescribed by your health care provider.  Take medicines only as directed by your health care provider.  If you have diabetes, follow any additional treatment instructions from your health care provider.  Keep all follow-up visits as directed by your health care provider. PREVENTION   Keep your ear dry. Use the corner of a  towel to absorb water out of the ear canal after swimming or bathing.  Avoid scratching or putting objects inside your ear. This can damage the ear canal or remove the protective wax that lines the canal. This makes it easier for bacteria and fungi to grow.  Avoid swimming in lakes, polluted water, or poorly chlorinated pools.  You may use ear drops made of rubbing alcohol and vinegar after swimming. Combine equal parts of white vinegar and alcohol in a bottle. Put 3 or 4 drops into each ear after swimming. SEEK MEDICAL CARE IF:   You have a fever.  Your ear is still red, swollen, painful, or draining pus after 3 days.  Your redness, swelling, or pain gets worse.  You have a severe headache.  You have redness, swelling, pain, or tenderness in the area behind your ear. MAKE SURE YOU:   Understand these instructions.  Will watch your condition.  Will get help right away if you are not doing well or get worse.   This information is not intended to replace advice given to you by your health care provider. Make sure you discuss any questions you have with your health care provider.   Document Released: 09/27/2005 Document Revised: 10/18/2014 Document Reviewed: 10/14/2011 Elsevier Interactive Patient Education 2016 [employer]   IF you received an x-ray today, you will receive an invoice from [facility] Please contact [facility] at [phone] with questions or concerns regarding your invoice.   IF you received labwork today, you will receive an invoice from [facility]. Please contact [facility] at [phone] with questions or concerns regarding your invoice.   Our billing staff will not be able to assist you with questions regarding bills from these companies.  You will be contacted with the lab results as soon as they are available. The fastest way to get your results is to activate your My Chart account. Instructions are located  on the last page of this paperwork. If you have not heard from us regarding the results in 2 weeks, please contact this office.
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
## 2016-05-29 ENCOUNTER — Encounter: Payer: Self-pay | Admitting: Family Medicine

## 2016-05-29 ENCOUNTER — Ambulatory Visit (INDEPENDENT_AMBULATORY_CARE_PROVIDER_SITE_OTHER): Payer: 59 | Admitting: Family Medicine

## 2016-05-29 VITALS — BP 140/80 | HR 84 | Temp 98.1°F | Resp 18 | Ht 66.0 in | Wt 279.0 lb

## 2016-05-29 DIAGNOSIS — H6091 Unspecified otitis externa, right ear: Secondary | ICD-10-CM

## 2016-05-29 DIAGNOSIS — E119 Type 2 diabetes mellitus without complications: Secondary | ICD-10-CM

## 2016-05-29 DIAGNOSIS — F329 Major depressive disorder, single episode, unspecified: Secondary | ICD-10-CM

## 2016-05-29 DIAGNOSIS — I10 Essential (primary) hypertension: Secondary | ICD-10-CM

## 2016-05-29 DIAGNOSIS — E785 Hyperlipidemia, unspecified: Secondary | ICD-10-CM

## 2016-05-29 DIAGNOSIS — Z794 Long term (current) use of insulin: Secondary | ICD-10-CM | POA: Diagnosis not present

## 2016-05-29 DIAGNOSIS — F32A Depression, unspecified: Secondary | ICD-10-CM

## 2016-05-29 LAB — COMPLETE METABOLIC PANEL WITH GFR
ALT: 19 U/L (ref 6–29)
AST: 26 U/L (ref 10–30)
Albumin: 3.8 g/dL (ref 3.6–5.1)
Alkaline Phosphatase: 48 U/L (ref 33–115)
BUN: 12 mg/dL (ref 7–25)
CALCIUM: 9 mg/dL (ref 8.6–10.2)
CHLORIDE: 101 mmol/L (ref 98–110)
CO2: 26 mmol/L (ref 20–31)
CREATININE: 0.82 mg/dL (ref 0.50–1.10)
GFR, EST NON AFRICAN AMERICAN: 89 mL/min (ref >=60)
Glucose, Bld: 324 mg/dL — ABNORMAL HIGH (ref 65–99)
POTASSIUM: 4.3 mmol/L (ref 3.5–5.3)
Sodium: 136 mmol/L (ref 135–146)
Total Bilirubin: 0.6 mg/dL (ref 0.2–1.2)
Total Protein: 6.6 g/dL (ref 6.1–8.1)

## 2016-05-29 LAB — LIPID PANEL
CHOLESTEROL: 205 mg/dL — AB (ref 125–200)
HDL: 21 mg/dL — AB (ref >=46)
TRIGLYCERIDES: 690 mg/dL — AB (ref <150)
Total CHOL/HDL Ratio: 9.8 Ratio — ABNORMAL HIGH (ref <=5.0)

## 2016-05-29 MED ORDER — INSULIN GLARGINE 100 UNIT/ML ~~LOC~~ SOLN: SUBCUTANEOUS | 3 refills | Status: DC

## 2016-05-29 MED ORDER — LISINOPRIL 5 MG PO TABS: 5.0000 mg | ORAL_TABLET | Freq: Every day | ORAL | 1 refills | Status: DC

## 2016-05-29 MED ORDER — HYDROCHLOROTHIAZIDE 25 MG PO TABS
25.0000 mg | ORAL_TABLET | Freq: Every day | ORAL | 1 refills | Status: DC
Start: 2016-05-29 — End: 2016-12-09

## 2016-05-29 MED ORDER — ATORVASTATIN CALCIUM 10 MG PO TABS: 10.0000 mg | ORAL_TABLET | Freq: Every day | ORAL | 1 refills | Status: DC

## 2016-05-29 MED ORDER — METFORMIN HCL 1000 MG PO TABS: ORAL_TABLET | ORAL | 1 refills | Status: DC

## 2016-05-29 MED ORDER — JANUVIA 100 MG PO TABS: 100.0000 mg | ORAL_TABLET | Freq: Every day | ORAL | 1 refills | Status: DC

## 2016-05-29 MED ORDER — DULOXETINE HCL 60 MG PO CPEP: ORAL_CAPSULE | ORAL | 1 refills | Status: DC
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
## 2016-05-29 NOTE — Patient Instructions (Signed)
     IF you received an x-ray today, you will receive an invoice from [facility]. Please contact [facility] at [phone] with questions or concerns regarding your invoice.   IF you received labwork today, you will receive an invoice from [facility]. Please contact [facility] at [phone] with questions or concerns regarding your invoice.   Our billing staff will not be able to assist you with questions regarding bills from these companies.  You will be contacted with the lab results as soon as they are available. The fastest way to get your results is to activate your My Chart account. Instructions are located on the last page of this paperwork. If you have not heard from us regarding the results in 2 weeks, please contact this office.      

## 2016-05-31 LAB — HEMOGLOBIN A1C
Hgb A1c MFr Bld: 9.2 % — ABNORMAL HIGH (ref <5.7)
Mean Plasma Glucose: 217 mg/dL

## 2016-05-31 MED FILL — metFORMIN HCL 1000 MG TABS: 1000 | 90 days supply | Qty: 180 | Fill #0

## 2016-05-31 MED FILL — JANUVIA 100 MG TABLET: 100 | 90 days supply | Qty: 90 | Fill #0

## 2016-05-31 MED FILL — LANTUS 100 UNITS/ML VIAL: 100 | 40 days supply | Qty: 20 | Fill #0

## 2016-05-31 MED FILL — HYDROCHLOROTHIAZIDE 25 MG T: 25 | 90 days supply | Qty: 90 | Fill #0

## 2016-05-31 MED FILL — LISINOPRIL 5 MG TABLET: 5 | 90 days supply | Qty: 90 | Fill #0

## 2016-06-01 ENCOUNTER — Ambulatory Visit: Payer: Self-pay | Admitting: Pharmacist

## 2016-06-08 ENCOUNTER — Encounter: Payer: Self-pay | Admitting: Pharmacist

## 2016-06-08 ENCOUNTER — Other Ambulatory Visit: Payer: Self-pay | Admitting: Pharmacist

## 2016-06-08 VITALS — BP 136/88 | Wt 270.0 lb

## 2016-06-08 DIAGNOSIS — E118 Type 2 diabetes mellitus with unspecified complications: Secondary | ICD-10-CM

## 2016-06-08 DIAGNOSIS — Z794 Long term (current) use of insulin: Secondary | ICD-10-CM
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
## 2016-06-16 DIAGNOSIS — M25561 Pain in right knee: Secondary | ICD-10-CM | POA: Diagnosis not present

## 2016-07-08 DIAGNOSIS — H5213 Myopia, bilateral: Secondary | ICD-10-CM | POA: Diagnosis not present

## 2016-07-19 MED FILL — DULoxetine HCL 60 MG CPEP: 60 | 90 days supply | Qty: 90 | Fill #0

## 2016-07-19 MED FILL — ATORVASTATIN 10 MG TABLET: 10 | 90 days supply | Qty: 90 | Fill #0

## 2016-07-27 MED FILL — LANTUS 100 UNITS/ML VIAL: 100 | 40 days supply | Qty: 20 | Fill #1

## 2016-08-31 MED FILL — HYDROCHLOROTHIAZIDE 25 MG T: 25 | 90 days supply | Qty: 90 | Fill #1

## 2016-08-31 MED FILL — LISINOPRIL 5 MG TABLET: 5 | 90 days supply | Qty: 90 | Fill #1

## 2016-08-31 MED FILL — LANTUS 100 UNITS/ML VIAL: 100 | 40 days supply | Qty: 20 | Fill #2

## 2016-08-31 MED FILL — JANUVIA 100 MG TABLET: 100 | 90 days supply | Qty: 90 | Fill #1

## 2016-08-31 MED FILL — ULTICARE SYR 0.5 ML 30GX5/1: 30G X 5/16" | 90 days supply | Qty: 100 | Fill #1

## 2016-09-09 ENCOUNTER — Ambulatory Visit: Payer: Self-pay | Admitting: Pharmacist

## 2016-10-05 MED FILL — LANTUS 100 UNITS/ML VIAL: 100 | 40 days supply | Qty: 20 | Fill #3

## 2016-10-18 ENCOUNTER — Emergency Department (HOSPITAL_COMMUNITY)
Admission: EM | Admit: 2016-10-18 | Discharge: 2016-10-18 | Disposition: A | Discharge status: Home / Self Care | Payer: 59 | Attending: Emergency Medicine | Admitting: Emergency Medicine

## 2016-10-18 ENCOUNTER — Encounter (HOSPITAL_COMMUNITY): Payer: Self-pay | Admitting: Emergency Medicine

## 2016-10-18 ENCOUNTER — Emergency Department (HOSPITAL_COMMUNITY): Payer: 59

## 2016-10-18 DIAGNOSIS — F17210 Nicotine dependence, cigarettes, uncomplicated: Secondary | ICD-10-CM | POA: Diagnosis not present

## 2016-10-18 DIAGNOSIS — E119 Type 2 diabetes mellitus without complications: Secondary | ICD-10-CM | POA: Diagnosis not present

## 2016-10-18 DIAGNOSIS — Z794 Long term (current) use of insulin: Secondary | ICD-10-CM | POA: Diagnosis not present

## 2016-10-18 DIAGNOSIS — I10 Essential (primary) hypertension: Secondary | ICD-10-CM | POA: Insufficient documentation

## 2016-10-18 DIAGNOSIS — R079 Chest pain, unspecified: Secondary | ICD-10-CM | POA: Diagnosis not present

## 2016-10-18 DIAGNOSIS — Z5321 Procedure and treatment not carried out due to patient leaving prior to being seen by health care provider: Secondary | ICD-10-CM | POA: Diagnosis not present

## 2016-10-18 LAB — CBC
HCT: 45 % (ref 36.0–46.0)
HEMOGLOBIN: 15.1 g/dL — AB (ref 12.0–15.0)
MCH: 30.6 pg (ref 26.0–34.0)
MCHC: 33.6 g/dL (ref 30.0–36.0)
MCV: 91.3 fL (ref 78.0–100.0)
PLATELETS: 190 10*3/uL (ref 150–400)
RBC: 4.93 MIL/uL (ref 3.87–5.11)
RDW: 14 % (ref 11.5–15.5)
WBC: 10.3 10*3/uL (ref 4.0–10.5)

## 2016-10-18 LAB — BASIC METABOLIC PANEL
Anion gap: 9 (ref 5–15)
BUN: 15 mg/dL (ref 6–20)
CHLORIDE: 102 mmol/L (ref 101–111)
CO2: 26 mmol/L (ref 22–32)
CREATININE: 0.94 mg/dL (ref 0.44–1.00)
Calcium: 9.9 mg/dL (ref 8.9–10.3)
GFR calc non Af Amer: >60 mL/min (ref >60)
GLUCOSE: 209 mg/dL — AB (ref 65–99)
Potassium: 3.7 mmol/L (ref 3.5–5.1)
Sodium: 137 mmol/L (ref 135–145)

## 2016-10-18 LAB — I-STAT TROPONIN, ED: Troponin i, poc: 0 ng/mL (ref 0.00–0.08)

## 2016-10-18 NOTE — ED Triage Notes (Signed)
Pt. reports intermittent central chest pain" spasms" onset Thursday last week radiating to mid back , mild SOB and nausea , denies diaphoresis . No cough or chest congestion .

## 2016-10-18 NOTE — ED Notes (Signed)
Pt came to nurse 1st and said she will go to urgent care in the morning and get her results.

## 2016-10-19 ENCOUNTER — Ambulatory Visit (INDEPENDENT_AMBULATORY_CARE_PROVIDER_SITE_OTHER): Payer: 59 | Admitting: Family Medicine

## 2016-10-19 VITALS — BP 144/68 | HR 86 | Temp 98.2°F | Resp 20 | Ht 66.0 in | Wt 274.0 lb

## 2016-10-19 DIAGNOSIS — M62838 Other muscle spasm: Secondary | ICD-10-CM

## 2016-10-19 DIAGNOSIS — S29011A Strain of muscle and tendon of front wall of thorax, initial encounter: Secondary | ICD-10-CM | POA: Diagnosis not present

## 2016-10-19 DIAGNOSIS — R079 Chest pain, unspecified: Secondary | ICD-10-CM | POA: Diagnosis not present

## 2016-10-19 MED ORDER — IBUPROFEN 800 MG PO TABS: 800.0000 mg | ORAL_TABLET | Freq: Three times a day (TID) | ORAL | 0 refills | Status: DC | PRN

## 2016-10-19 MED ORDER — CYCLOBENZAPRINE HCL 10 MG PO TABS: 10.0000 mg | ORAL_TABLET | Freq: Three times a day (TID) | ORAL | 0 refills | Status: DC | PRN

## 2016-10-19 MED FILL — IBUPROFEN 800 MG TABLET: 800 | 10 days supply | Qty: 30 | Fill #0

## 2016-10-19 MED FILL — CYCLOBENZAPRINE 10 MG TAB: 10 | 10 days supply | Qty: 30 | Fill #0

## 2016-10-19 NOTE — Patient Instructions (Addendum)
   IF you received an x-ray today, you will receive an invoice from [facility]. Please contact [facility] at [phone] with questions or concerns regarding your invoice.   IF you received labwork today, you will receive an invoice from [facility]. Please contact [facility] at [phone] with questions or concerns regarding your invoice.   Our billing staff will not be able to assist you with questions regarding bills from these companies.  You will be contacted with the lab results as soon as they are available. The fastest way to get your results is to activate your My Chart account. Instructions are located on the last page of this paperwork. If you have not heard from us regarding the results in 2 weeks, please contact this office.      Muscle Cramps and Spasms Muscle cramps and spasms occur when a muscle or muscles tighten and you have no control over this tightening (involuntary muscle contraction). They are a common problem and can develop in any muscle. The most common place is in the calf muscles of the leg. Both muscle cramps and muscle spasms are involuntary muscle contractions, but they also have differences:   Muscle cramps are sporadic and painful. They may last a few seconds to a quarter of an hour. Muscle cramps are often more forceful and last longer than muscle spasms.  Muscle spasms may or may not be painful. They may also last just a few seconds or much longer. CAUSES  It is uncommon for cramps or spasms to be due to a serious underlying problem. In many cases, the cause of cramps or spasms is unknown. Some common causes are:   Overexertion.   Overuse from repetitive motions (doing the same thing over and over).   Remaining in a certain position for a long period of time.   Improper preparation, form, or technique while performing a sport or activity.   Dehydration.   Injury.   Side effects of some medicines.   Abnormally low  levels of the salts and ions in your blood (electrolytes), especially potassium and calcium. This could happen if you are taking water pills (diuretics) or you are pregnant.  Some underlying medical problems can make it more likely to develop cramps or spasms. These include, but are not limited to:   Diabetes.   Parkinson disease.   Hormone disorders, such as thyroid problems.   Alcohol abuse.   Diseases specific to muscles, joints, and bones.   Blood vessel disease where not enough blood is getting to the muscles.  HOME CARE INSTRUCTIONS   Stay well hydrated. Drink enough water and fluids to keep your urine clear or pale yellow.  It may be helpful to massage, stretch, and relax the affected muscle.  For tight or tense muscles, use a warm towel, heating pad, or hot shower water directed to the affected area.  If you are sore or have pain after a cramp or spasm, applying ice to the affected area may relieve discomfort.  Put ice in a plastic bag.  Place a towel between your skin and the bag.  Leave the ice on for 15-20 minutes, 3-4 times a day.  Medicines used to treat a known cause of cramps or spasms may help reduce their frequency or severity. Only take over-the-counter or prescription medicines as directed by your caregiver. SEEK MEDICAL CARE IF:  Your cramps or spasms get more severe, more frequent, or do not improve over time.  MAKE SURE YOU:   Understand   these instructions.  Will watch your condition.  Will get help right away if you are not doing well or get worse. This information is not intended to replace advice given to you by your health care provider. Make sure you discuss any questions you have with your health care provider. Document Released: 03/19/2002 Document Revised: 01/22/2013 Document Reviewed: 07/01/2015 Elsevier Interactive Patient Education  2017 [employer].  Chest Wall Pain Chest wall pain is pain in or around the bones and muscles of  your chest. Sometimes, an injury causes this pain. Sometimes, the cause may not be known. This pain may take several weeks or longer to get better. Follow these instructions at home: Pay attention to any changes in your symptoms. Take these actions to help with your pain:  Rest as told by your health care provider.  Avoid activities that cause pain. These include any activities that use your chest muscles or your abdominal and side muscles to lift heavy items.  If directed, apply ice to the painful area:  Put ice in a plastic bag.  Place a towel between your skin and the bag.  Leave the ice on for 20 minutes, 2-3 times per day.  Take over-the-counter and prescription medicines only as told by your health care provider.  Do not use tobacco products, including cigarettes, chewing tobacco, and e-cigarettes. If you need help quitting, ask your health care provider.  Keep all follow-up visits as told by your health care provider. This is important. Contact a health care provider if:  You have a fever.  Your chest pain becomes worse.  You have new symptoms. Get help right away if:  You have nausea or vomiting.  You feel sweaty or light-headed.  You have a cough with phlegm (sputum) or you cough up blood.  You develop shortness of breath. This information is not intended to replace advice given to you by your health care provider. Make sure you discuss any questions you have with your health care provider. Document Released: 09/27/2005 Document Revised: 02/05/2016 Document Reviewed: 12/23/2014 Elsevier Interactive Patient Education  2017 [employer]

## 2016-10-19 NOTE — Progress Notes (Signed)
Subjective:  By signing my name below, I, Renee Ho, attest that this documentation has been prepared under the direction and in the presence of Renee SorensonEva Sacoya Mcgourty, MD Electronically Signed: Charline BillsEssence Ho, ED Scribe 10/19/2016 at 10:22 AM.   Patient ID: Renee Ho, female    DOB: 05/03/1976, 41 y.o.   MRN: [id number]  Chief Complaint  Patient presents with  . Chest Pain    Pt stated that it feels like a spasm that shoots to her back.    HPI HPI Comments: Renee Ho is a 41 y.o. female who [facility] complaining of gradually worsening sternal chest pain onset 5 days ago. Pt checked into the ED yesterday. Her EKG showed a normal sinus rhythm and no ischemic change. CXR was clear and pt had a normal BMP and CBC. Pt's glucose was 209 yesterday, which is high for her but she states that she had been eating out of the snack machine while in the ED waiting room. Pt states that she initially noticed pain upon waking 5 mornings ago. She describes pain as spasms that shoots into her back and radiates through her back. Pain is exacerbated with deep breaths and movement. 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 She states that spasms lasts for a few seconds at a time and are so severe that they take her breathe away. Pt wears a c-pap nightly but states that pain wakes her from her sleep twice nightly. She has taking 800 mg ibuprofen daily with minimal relief with her last dose being at midnight. She reports cold-like symptoms on Christmas but denies any since. She denies postnasal drip, cough, change in voice, light-headedness, dizziness, difficulty swallowing, loss of appetite. Pt also denies h/o asthma or inhaler use. She smokes less than half ppd for over 20 years. Pt has an allergy to Toradol.   Past Medical History:  Diagnosis Date  . Depression   . Diabetes mellitus   . Diabetes mellitus  without complication (HCC)   . Diverticula of intestine   . High cholesterol   . Hypertension   . Polycystic ovarian syndrome   . Sleep apnea    Current Outpatient Prescriptions on File Prior to Visit  Medication Sig Dispense Refill  . atorvastatin (LIPITOR) 10 MG tablet Take 1 tablet (10 mg total) by mouth daily. 90 tablet 1  . cephALEXin (KEFLEX) 500 MG capsule Take 1 capsule (500 mg total) by mouth 2 (two) times daily. 20 capsule 0  . DULoxetine (CYMBALTA) 60 MG capsule TAKE 1 CAPSULE BY MOUTH DAILY. 90 capsule 1  . hydrochlorothiazide (HYDRODIURIL) 25 MG tablet Take 1 tablet (25 mg total) by mouth daily. 90 tablet 1  . HYDROcodone-acetaminophen (NORCO/VICODIN) 5-325 MG tablet Take 1 tablet by mouth every 4 (four) hours as needed for moderate pain. 12 tablet 0  . insulin glargine (LANTUS) 100 UNIT/ML injection Inject 50 units into the skin at bedtime. 20 mL 3  . Insulin Syringe-Needle U-100 (TRUEPLUS INSULIN SYRINGE) 30G X 5/16" 0.5 ML MISC USE AS DIRECTED EVERY DAY 100 each PRN  . JANUVIA 100 MG tablet Take 1 tablet (100 mg total) by mouth daily. 90 tablet 1  . lisinopril (PRINIVIL,ZESTRIL) 5 MG tablet Take 1 tablet (5 mg total) by mouth daily. 90 tablet 1  . metFORMIN (GLUCOPHAGE) 1000 MG tablet TAKE 1 TABLET (1,000 MG TOTAL) BY MOUTH 2 (TWO) TIMES DAILY  WITH A MEAL. 180 tablet 1  . ofloxacin (FLOXIN OTIC) 0.3 % otic solution Place 10 drops into both ears daily. 10 mL 0   No current facility-administered medications on file prior to visit.    Allergies  Allergen Reactions  . Morphine And Related Other (See Comments)    Migraines  . Reglan [Metoclopramide] Other (See Comments)    Psychotic episodes  . Toradol [Ketorolac Tromethamine] Rash   Review of Systems  Constitutional: Negative for appetite change.  HENT: Negative for postnasal drip, trouble swallowing and voice change.   Respiratory: Negative for cough.   Cardiovascular: Positive for chest pain.  Neurological: Negative  for dizziness and light-headedness.      Objective:   Physical Exam  Constitutional: She is oriented to person, place, and time. She appears well-developed and well-nourished. No distress.  HENT:  Head: Normocephalic and atraumatic.  Eyes: Conjunctivae and EOM are normal.  Neck: Neck supple. No tracheal deviation present.  Cardiovascular: Normal rate.   Pulmonary/Chest: Effort normal. No respiratory distress.  Chest wall is tender to palpation. Pain with minimal movement.   Musculoskeletal: Normal range of motion.  Neurological: She is alert and oriented to person, place, and time.  Skin: Skin is warm and dry.  Psychiatric: She has a normal mood and affect. Her behavior is normal.  Nursing note and vitals reviewed.  BP (!) 144/68 (BP Location: Right Arm, Patient Position: Sitting, Cuff Size: Large)   Pulse 86   Temp 98.2 F (36.8 C) (Oral)   Resp 20   Ht 5\' 6"  (1.676 m)   Wt 274 lb (124.3 kg)   SpO2 97%   BMI 44.22 kg/m     Assessment & Plan:   1. Chest pain, unspecified type   2. Chest wall muscle strain, initial encounter   3. Muscle spasm   ED records reviewed in detail Out of work, no lifting. Rest, ice. RTC if sxs cont  Meds ordered this encounter  Medications  . cyclobenzaprine (FLEXERIL) 10 MG tablet    Sig: Take 1 tablet (10 mg total) by mouth 3 (three) times daily as needed for muscle spasms.    Dispense:  30 tablet    Refill:  0  . ibuprofen (ADVIL,MOTRIN) 800 MG tablet    Sig: Take 1 tablet (800 mg total) by mouth every 8 (eight) hours as needed.    Dispense:  30 tablet    Refill:  0    I personally performed the services described in this documentation, which was scribed in my presence. The recorded information has been reviewed and considered, and addended by me as needed.   Renee Sorenson, M.D.  [facility] [address] [place], [place] [address] [phone] phone [phone] fax  11/07/16 4:55 AM

## 2016-10-25 ENCOUNTER — Telehealth: Payer: Self-pay

## 2016-10-25 NOTE — Telephone Encounter (Signed)
Patient need FMLA forms completed by Dr Clelia CroftShaw for her recent OV for chest pain. I have completed what I could from the OV notes and highlighted the areas that need to be finished. I will place the forms in Dr Alver FisherShaw's box on 10/25/16 if you could please return them to the FMLA/Disability box at the [employer] checkout desk within 5-7 business days. Thank you!

## 2016-10-26 NOTE — Telephone Encounter (Signed)
Done and returned. Thanks for your excellent work [employer]!

## 2016-10-30 NOTE — Telephone Encounter (Signed)
Paperwork scanned and faxed to [employer] on 10/30/16

## 2016-11-01 MED FILL — DULoxetine HCL 60 MG CPEP: 60 | 90 days supply | Qty: 90 | Fill #1

## 2016-11-01 MED FILL — ATORVASTATIN 10 MG TABLET: 10 | 90 days supply | Qty: 90 | Fill #1

## 2016-11-18 ENCOUNTER — Other Ambulatory Visit: Payer: Self-pay | Admitting: Family Medicine

## 2016-11-18 DIAGNOSIS — Z794 Long term (current) use of insulin: Principal | ICD-10-CM

## 2016-11-18 DIAGNOSIS — E119 Type 2 diabetes mellitus without complications: Secondary | ICD-10-CM

## 2016-11-18 MED FILL — LANTUS 100 UNITS/ML VIAL: 100 | 20 days supply | Qty: 10 | Fill #0

## 2016-12-09 ENCOUNTER — Encounter: Payer: Self-pay | Admitting: Family Medicine

## 2016-12-09 ENCOUNTER — Ambulatory Visit (INDEPENDENT_AMBULATORY_CARE_PROVIDER_SITE_OTHER): Payer: 59 | Admitting: Family Medicine

## 2016-12-09 VITALS — BP 134/82 | HR 88 | Temp 100.0°F | Resp 18 | Ht 66.0 in | Wt 270.0 lb

## 2016-12-09 DIAGNOSIS — E781 Pure hyperglyceridemia: Secondary | ICD-10-CM | POA: Diagnosis not present

## 2016-12-09 DIAGNOSIS — E1165 Type 2 diabetes mellitus with hyperglycemia: Secondary | ICD-10-CM

## 2016-12-09 DIAGNOSIS — F329 Major depressive disorder, single episode, unspecified: Secondary | ICD-10-CM | POA: Diagnosis not present

## 2016-12-09 DIAGNOSIS — I10 Essential (primary) hypertension: Secondary | ICD-10-CM

## 2016-12-09 DIAGNOSIS — H9201 Otalgia, right ear: Secondary | ICD-10-CM

## 2016-12-09 DIAGNOSIS — R110 Nausea: Secondary | ICD-10-CM

## 2016-12-09 DIAGNOSIS — F32A Depression, unspecified: Secondary | ICD-10-CM

## 2016-12-09 DIAGNOSIS — Z794 Long term (current) use of insulin: Secondary | ICD-10-CM

## 2016-12-09 LAB — GLUCOSE, POCT (MANUAL RESULT ENTRY): POC Glucose: 62 mg/dl — AB (ref 70–99)

## 2016-12-09 MED ORDER — DULOXETINE HCL 60 MG PO CPEP: ORAL_CAPSULE | ORAL | 1 refills | Status: DC

## 2016-12-09 MED ORDER — HYDROCHLOROTHIAZIDE 25 MG PO TABS: 25.0000 mg | ORAL_TABLET | Freq: Every day | ORAL | 1 refills | Status: DC

## 2016-12-09 MED ORDER — INSULIN GLARGINE 100 UNIT/ML ~~LOC~~ SOLN: SUBCUTANEOUS | 3 refills | Status: DC

## 2016-12-09 MED ORDER — METFORMIN HCL 1000 MG PO TABS: ORAL_TABLET | ORAL | 1 refills | Status: DC

## 2016-12-09 MED ORDER — GLUCOSE BLOOD VI STRP: ORAL_STRIP | 4 refills | Status: DC

## 2016-12-09 MED ORDER — ATORVASTATIN CALCIUM 10 MG PO TABS: 10.0000 mg | ORAL_TABLET | Freq: Every day | ORAL | 1 refills | Status: DC

## 2016-12-09 MED ORDER — LISINOPRIL 5 MG PO TABS: 5.0000 mg | ORAL_TABLET | Freq: Every day | ORAL | 1 refills | Status: DC

## 2016-12-09 MED ORDER — BLOOD GLUCOSE MONITOR KIT: PACK | 0 refills | Status: DC

## 2016-12-09 MED ORDER — "INSULIN SYRINGE-NEEDLE U-100 30G X 5/16"" 0.5 ML MISC": 99 refills | Status: DC

## 2016-12-09 MED ORDER — JANUVIA 100 MG PO TABS: 100.0000 mg | ORAL_TABLET | Freq: Every day | ORAL | 1 refills | Status: DC

## 2016-12-09 MED FILL — FREESTYLE LITE METER: 30 days supply | Qty: 1 | Fill #0

## 2016-12-09 MED FILL — JANUVIA 100 MG TABLET: 100 | 90 days supply | Qty: 90 | Fill #0

## 2016-12-09 MED FILL — FREESTYLE LITE TEST STRIP: 30 days supply | Qty: 100 | Fill #0

## 2016-12-09 MED FILL — LISINOPRIL 5 MG TABLET: 5 | 90 days supply | Qty: 90 | Fill #0

## 2016-12-09 MED FILL — metFORMIN HCL 1000 MG TABS: 1000 | 90 days supply | Qty: 180 | Fill #0

## 2016-12-09 MED FILL — LANTUS 100 UNITS/ML VIAL: 100 | 40 days supply | Qty: 20 | Fill #0

## 2016-12-09 MED FILL — ULTICARE SYR 0.5 ML 30GX5/1: 30G X 5/16" | 30 days supply | Qty: 100 | Fill #0

## 2016-12-09 MED FILL — HYDROCHLOROTHIAZIDE 25 MG T: 25 | 90 days supply | Qty: 90 | Fill #0
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
## 2016-12-09 NOTE — Patient Instructions (Addendum)
If blood pressures remain over 130/80 - return to discuss changes in medication.   I do not see any sign of ear infection at this time, but if persistent ear pain tomorrow or into Saturday, return for recheck as there is a potential for early otitis externa or outer ear infection. Return sooner if worsening symptoms.  For your diabetes, restart Lantus at 50 units for now until I know your lab results.  Depending on those levels, may refer you to an endocrinologist to help with treatment of diabetes. If any low blood sugars, I also need to know as that may affect your med changes.   No change in Cymbalta dose for now, and same dose of cholesterol medicine.   Your blood sugar was slightly low in office today. Make sure you eat as soon as leaving our office.  If any lightheadedness, sweating, or feeling worse - be seen immediately in ER or call 911 if needed.    Hypoglycemia Hypoglycemia occurs when the level of sugar (glucose) in the blood is too low. Glucose is a type of sugar that provides the body's main source of energy. Certain hormones (insulin and glucagon) control the level of glucose in the blood. Insulin lowers blood glucose, and glucagon increases blood glucose. Hypoglycemia can result from having too much insulin in the bloodstream, or from not eating enough food that contains glucose. Hypoglycemia can happen in people who do or do not have diabetes. It can develop quickly, and it can be a medical emergency. What are the causes? Hypoglycemia occurs most often in people who have diabetes. If you have diabetes, hypoglycemia may be caused by:  Diabetes medicine.  Not eating enough, or not eating often enough.  Increased physical activity.  Drinking alcohol, especially when you have not eaten recently. If you do not have diabetes, hypoglycemia may be caused by:  A tumor in the pancreas. The pancreas is the organ that makes insulin.  Not eating enough, or not eating for long  periods at a time (fasting).  Severe infection or illness that affects the liver, heart, or kidneys.  Certain medicines. You may also have reactive hypoglycemia. This condition causes hypoglycemia within 4 hours of eating a meal. This may occur after having stomach surgery. Sometimes, the cause of reactive hypoglycemia is not known. What increases the risk? Hypoglycemia is more likely to develop in:  People who have diabetes and take medicines to lower blood glucose.  People who abuse alcohol.  People who have a severe illness. What are the signs or symptoms? Hypoglycemia may not cause any symptoms. If you have symptoms, they may include:  Hunger.  Anxiety.  Sweating and feeling clammy.  Confusion.  Dizziness or feeling light-headed.  Sleepiness.  Nausea.  Increased heart rate.  Headache.  Blurry vision.  Seizure.  Nightmares.  Tingling or numbness around the mouth, lips, or tongue.  A change in speech.  Decreased ability to concentrate.  A change in coordination.  Restless sleep.  Tremors or shakes.  Fainting.  Irritability. How is this diagnosed? Hypoglycemia is diagnosed with a blood test to measure your blood glucose level. This blood test is done while you are having symptoms. Your health care provider may also do a physical exam and review your medical history. If you do not have diabetes, other tests may be done to find the cause of your hypoglycemia. How is this treated? This condition can often be treated by immediately eating or drinking something that contains glucose, such as:  3-4 sugar tablets (glucose pills).  Glucose gel, 15-gram tube.  Fruit juice, 4 oz (120 mL).  Regular soda (not diet soda), 4 oz (120 mL).  Low-fat milk, 4 oz (120 mL).  Several pieces of hard candy.  Sugar or honey, 1 Tbsp. Treating Hypoglycemia If You Have Diabetes   If you are alert and able to swallow safely, follow the 15:15 rule:  Take 15 grams of  a rapid-acting carbohydrate. Rapid-acting options include:  1 tube of glucose gel.  3 glucose pills.  6-8 pieces of hard candy.  4 oz (120 mL) of fruit juice.  4 oz (120 ml) of regular (not diet) soda.  Check your blood glucose 15 minutes after you take the carbohydrate.  If the repeat blood glucose level is still at or below 70 mg/dL (3.9 mmol/L), take 15 grams of a carbohydrate again.  If your blood glucose level does not increase above 70 mg/dL (3.9 mmol/L) after 3 tries, seek emergency medical care.  After your blood glucose level returns to normal, eat a meal or a snack within 1 hour. Treating Severe Hypoglycemia  Severe hypoglycemia is when your blood glucose level is at or below 54 mg/dL (3 mmol/L). Severe hypoglycemia is an emergency. Do not wait to see if the symptoms will go away. Get medical help right away. Call your local emergency services (911 in the [place] Do not drive yourself to the hospital. If you have severe hypoglycemia and you cannot eat or drink, you may need an injection of glucagon. A family member or close friend should learn how to check your blood glucose and how to give you a glucagon injection. Ask your health care provider if you need to have an emergency glucagon injection kit available. Severe hypoglycemia may need to be treated in a hospital. The treatment may include getting glucose through an IV tube. You may also need treatment for the cause of your hypoglycemia. Follow these instructions at home: General instructions   Avoid any diets that cause you to not eat enough food. Talk with your health care provider before you start any new diet.  Take over-the-counter and prescription medicines only as told by your health care provider.  Limit alcohol intake to no more than 1 drink per day for nonpregnant women and 2 drinks per day for men. One drink equals 12 oz of beer, 5 oz of wine, or 1 oz of hard liquor.  Keep all follow-up visits as told by your  health care provider. This is important. If You Have Diabetes:    Make sure you know the symptoms of hypoglycemia.  Always have a rapid-acting carbohydrate snack with you to treat low blood sugar.  Follow your diabetes management plan, as told by your health care provider. Make sure you:  Take your medicines as directed.  Follow your exercise plan.  Follow your meal plan. Eat on time, and do not skip meals.  Check your blood glucose as often as directed. Make sure to check your blood glucose before and after exercise. If you exercise longer or in a different way than usual, check your blood glucose more often.  Follow your sick day plan whenever you cannot eat or drink normally. Make this plan in advance with your health care provider.  Share your diabetes management plan with people in your workplace, school, and household.  Check your urine for ketones when you are ill and as told by your health care provider.  Carry a medical alert card or  wear medical alert jewelry. If You Have Reactive Hypoglycemia or Low Blood Sugar From Other Causes:   Monitor your blood glucose as told by your health care provider.  Follow instructions from your health care provider about eating or drinking restrictions. Contact a health care provider if:  You have problems keeping your blood glucose in your target range.  You have frequent episodes of hypoglycemia. Get help right away if:  You continue to have hypoglycemia symptoms after eating or drinking something containing glucose.  Your blood glucose is at or below 54 mg/dL (3 mmol/L).  You have a seizure.  You faint. These symptoms may represent a serious problem that is an emergency. Do not wait to see if the symptoms will go away. Get medical help right away. Call your local emergency services (911 in the [place] Do not drive yourself to the hospital. This information is not intended to replace advice given to you by your health care  provider. Make sure you discuss any questions you have with your health care provider. Document Released: 09/27/2005 Document Revised: 03/10/2016 Document Reviewed: 10/31/2015 Elsevier Interactive Patient Education  2017 [employer].    If  you received an x-ray today, you will receive an invoice from [facility]. Please contact [facility] at [phone] with questions or concerns regarding your invoice.   IF you received labwork today, you will receive an invoice from [facility]. Please contact [facility] at [phone] with questions or concerns regarding your invoice.   Our billing staff will not be able to assist you with questions regarding bills from these companies.  You will be contacted with the lab results as soon as they are available. The fastest way to get your results is to activate your My Chart account. Instructions are located on the last page of this paperwork. If you have not heard from [place] regarding the results in 2 weeks, please contact this office.

## 2016-12-10 LAB — COMPREHENSIVE METABOLIC PANEL
A/G RATIO: 1.4 (ref 1.2–2.2)
ALBUMIN: 4.1 g/dL (ref 3.5–5.5)
ALT: 20 IU/L (ref 0–32)
AST: 25 IU/L (ref 0–40)
Alkaline Phosphatase: 55 IU/L (ref 39–117)
BILIRUBIN TOTAL: 0.5 mg/dL (ref 0.0–1.2)
BUN / CREAT RATIO: 13 (ref 9–23)
BUN: 9 mg/dL (ref 6–24)
CO2: 22 mmol/L (ref 18–29)
Calcium: 9.6 mg/dL (ref 8.7–10.2)
Chloride: 96 mmol/L (ref 96–106)
Creatinine, Ser: 0.69 mg/dL (ref 0.57–1.00)
GFR calc non Af Amer: 108 mL/min/{1.73_m2} (ref >59)
GFR, EST AFRICAN AMERICAN: 124 mL/min/{1.73_m2} (ref >59)
GLOBULIN, TOTAL: 3 g/dL (ref 1.5–4.5)
GLUCOSE: 166 mg/dL — AB (ref 65–99)
POTASSIUM: 4.1 mmol/L (ref 3.5–5.2)
SODIUM: 137 mmol/L (ref 134–144)
TOTAL PROTEIN: 7.1 g/dL (ref 6.0–8.5)

## 2016-12-10 LAB — LIPID PANEL
CHOLESTEROL TOTAL: 145 mg/dL (ref 100–199)
Chol/HDL Ratio: 5.2 ratio units — ABNORMAL HIGH (ref 0.0–4.4)
HDL: 28 mg/dL — ABNORMAL LOW (ref >39)
LDL Calculated: 65 mg/dL (ref 0–99)
Triglycerides: 258 mg/dL — ABNORMAL HIGH (ref 0–149)
VLDL Cholesterol Cal: 52 mg/dL — ABNORMAL HIGH (ref 5–40)

## 2016-12-10 LAB — MICROALBUMIN, URINE: Microalbumin, Urine: <3 ug/mL

## 2016-12-10 LAB — HEMOGLOBIN A1C
Est. average glucose Bld gHb Est-mCnc: 217 mg/dL
HEMOGLOBIN A1C: 9.2 % — AB (ref 4.8–5.6)

## 2016-12-21 ENCOUNTER — Telehealth: Payer: 59 | Admitting: Nurse Practitioner

## 2016-12-21 DIAGNOSIS — H60331 Swimmer's ear, right ear: Secondary | ICD-10-CM | POA: Diagnosis not present

## 2016-12-21 MED ORDER — NEOMYCIN-POLYMYXIN-HC 3.5-10000-1 OT SUSP: 4.0000 [drp] | Freq: Four times a day (QID) | OTIC | 0 refills | Status: DC

## 2016-12-21 NOTE — Progress Notes (Signed)
E Visit for Swimmer's Ear  We are sorry that you are not feeling well. Here is how we plan to help!  I have prescribed: Neomycin 0.35%, polymyxin B 10,000 units/mL, and hydrocortisone 0,5% otic solution 4 drops in affected ears four times a day until completed    In certain cases swimmer's ear may progress to a more serious bacterial infection of the middle or inner ear.  If you have a fever 102 and up and significantly worsening symptoms, this could indicate a more serious infection moving to the middle/inner and needs face to face evaluation in an office by a provider.  Your symptoms should improve over the next 3 days and should resolve in about 7 days.  HOME CARE:   Wash your hands frequently.  Do not place the tip of the bottle on your ear or touch it with your fingers.  You can take Acetominophen 650 mg every 4-6 hours as needed for pain.  If pain is severe or moderate, you can apply a heating pad (set on low) or hot water bottle (wrapped in a towel) to outer ear for 20 minutes.  This will also increase drainage.  Avoid ear plugs  Do not use Q-tips  After showers, help the water run out by tilting your head to one side.  GET HELP RIGHT AWAY IF:   Fever is over 102.2 degrees.  You develop progressive ear pain or hearing loss.  Ear symptoms persist longer than 3 days after treatment.  MAKE SURE YOU:   Understand these instructions.  Will watch your condition.  Will get help right away if you are not doing well or get worse.  TO PREVENT SWIMMER'S EAR:  Use a bathing cap or custom fitted swim molds to keep your ears dry.  Towel off after swimming to dry your ears.  Tilt your head or pull your earlobes to allow the water to escape your ear canal.  If there is still water in your ears, consider using a hairdryer on the lowest setting.  Thank you for choosing an e-visit. Your e-visit answers were reviewed by a board certified advanced clinical practitioner to  complete your personal care plan. Depending upon the condition, your plan could have included both over the counter or prescription medications. Please review your pharmacy choice. Be sure that the pharmacy you have chosen is open so that you can pick up your prescription now.  If there is a problem you may message your provider in MyChart to have the prescription routed to another pharmacy. Your safety is important to us. If you have drug allergies check your prescription carefully.  For the next 24 hours, you can use MyChart to ask questions about today's visit, request a non-urgent call back, or ask for a work or school excuse from your e-visit provider. You will get an email in the next two days asking about your experience. I hope that your e-visit has been valuable and will speed your recovery.      

## 2016-12-22 ENCOUNTER — Ambulatory Visit (INDEPENDENT_AMBULATORY_CARE_PROVIDER_SITE_OTHER): Payer: 59 | Admitting: Emergency Medicine

## 2016-12-22 ENCOUNTER — Encounter (HOSPITAL_COMMUNITY): Payer: Self-pay | Admitting: Emergency Medicine

## 2016-12-22 ENCOUNTER — Emergency Department (HOSPITAL_COMMUNITY): Payer: 59

## 2016-12-22 ENCOUNTER — Emergency Department (HOSPITAL_COMMUNITY)
Admission: EM | Admit: 2016-12-22 | Discharge: 2016-12-22 | Disposition: A | Discharge status: Home / Self Care | Payer: 59 | Attending: Emergency Medicine | Admitting: Emergency Medicine

## 2016-12-22 VITALS — BP 136/80 | HR 96 | Temp 98.8°F | Resp 16 | Ht 66.0 in | Wt 273.0 lb

## 2016-12-22 DIAGNOSIS — I10 Essential (primary) hypertension: Secondary | ICD-10-CM | POA: Insufficient documentation

## 2016-12-22 DIAGNOSIS — E119 Type 2 diabetes mellitus without complications: Secondary | ICD-10-CM | POA: Insufficient documentation

## 2016-12-22 DIAGNOSIS — R739 Hyperglycemia, unspecified: Secondary | ICD-10-CM

## 2016-12-22 DIAGNOSIS — H6691 Otitis media, unspecified, right ear: Secondary | ICD-10-CM

## 2016-12-22 DIAGNOSIS — F17210 Nicotine dependence, cigarettes, uncomplicated: Secondary | ICD-10-CM | POA: Insufficient documentation

## 2016-12-22 DIAGNOSIS — Z79899 Other long term (current) drug therapy: Secondary | ICD-10-CM | POA: Diagnosis not present

## 2016-12-22 DIAGNOSIS — Z794 Long term (current) use of insulin: Secondary | ICD-10-CM | POA: Insufficient documentation

## 2016-12-22 DIAGNOSIS — E1165 Type 2 diabetes mellitus with hyperglycemia: Secondary | ICD-10-CM | POA: Diagnosis not present

## 2016-12-22 DIAGNOSIS — R110 Nausea: Secondary | ICD-10-CM

## 2016-12-22 DIAGNOSIS — H9201 Otalgia, right ear: Secondary | ICD-10-CM

## 2016-12-22 LAB — CBC WITH DIFFERENTIAL/PLATELET
Basophils Absolute: 0 10*3/uL (ref 0.0–0.1)
Basophils Relative: 0 %
EOS ABS: 0.2 10*3/uL (ref 0.0–0.7)
Eosinophils Relative: 2 %
HEMATOCRIT: 45.5 % (ref 36.0–46.0)
HEMOGLOBIN: 15.7 g/dL — AB (ref 12.0–15.0)
LYMPHS ABS: 2.7 10*3/uL (ref 0.7–4.0)
Lymphocytes Relative: 26 %
MCH: 31.3 pg (ref 26.0–34.0)
MCHC: 34.5 g/dL (ref 30.0–36.0)
MCV: 90.6 fL (ref 78.0–100.0)
MONO ABS: 0.6 10*3/uL (ref 0.1–1.0)
MONOS PCT: 6 %
NEUTROS PCT: 66 %
Neutro Abs: 6.7 10*3/uL (ref 1.7–7.7)
Platelets: 188 10*3/uL (ref 150–400)
RBC: 5.02 MIL/uL (ref 3.87–5.11)
RDW: 13.8 % (ref 11.5–15.5)
WBC: 10.2 10*3/uL (ref 4.0–10.5)

## 2016-12-22 LAB — BASIC METABOLIC PANEL
Anion gap: 10 (ref 5–15)
BUN: 9 mg/dL (ref 6–20)
CALCIUM: 9.7 mg/dL (ref 8.9–10.3)
CHLORIDE: 98 mmol/L — AB (ref 101–111)
CO2: 27 mmol/L (ref 22–32)
CREATININE: 0.77 mg/dL (ref 0.44–1.00)
GFR calc Af Amer: >60 mL/min (ref >60)
GFR calc non Af Amer: >60 mL/min (ref >60)
Glucose, Bld: 321 mg/dL — ABNORMAL HIGH (ref 65–99)
Potassium: 3.9 mmol/L (ref 3.5–5.1)
SODIUM: 135 mmol/L (ref 135–145)

## 2016-12-22 MED ORDER — OXYCODONE-ACETAMINOPHEN 5-325 MG PO TABS: 1.0000 | ORAL_TABLET | ORAL | 0 refills | Status: DC | PRN

## 2016-12-22 MED ORDER — SODIUM CHLORIDE 0.9 % IV SOLN
3.0000 g | Freq: Once | INTRAVENOUS | Status: AC
  Administered 2016-12-22: 3 g via INTRAVENOUS
  Filled 2016-12-22: qty 3

## 2016-12-22 MED ORDER — HYDROMORPHONE HCL 2 MG/ML IJ SOLN
1.0000 mg | Freq: Once | INTRAMUSCULAR | Status: AC
  Administered 2016-12-22: 1 mg via INTRAVENOUS
  Filled 2016-12-22: qty 1

## 2016-12-22 MED ORDER — LORAZEPAM 2 MG/ML IJ SOLN
1.0000 mg | Freq: Once | INTRAMUSCULAR | Status: AC
  Administered 2016-12-22: 1 mg via INTRAVENOUS
  Filled 2016-12-22: qty 1

## 2016-12-22 MED ORDER — IOPAMIDOL (ISOVUE-300) INJECTION 61%
INTRAVENOUS | Status: AC
  Administered 2016-12-22: 75 mL
  Filled 2016-12-22: qty 75

## 2016-12-22 MED ORDER — HYDROMORPHONE HCL 1 MG/ML IJ SOLN
0.5000 mg | Freq: Once | INTRAMUSCULAR | Status: AC
  Administered 2016-12-22: 0.5 mg via INTRAVENOUS
  Filled 2016-12-22: qty 1

## 2016-12-22 MED ORDER — ONDANSETRON 4 MG PO TBDP
4.0000 mg | ORAL_TABLET | Freq: Once | ORAL | Status: AC | PRN
  Administered 2016-12-22: 4 mg via ORAL
  Filled 2016-12-22: qty 1

## 2016-12-22 MED ORDER — ONDANSETRON 4 MG PO TBDP
ORAL_TABLET | ORAL | Status: AC
  Administered 2016-12-22: 4 mg via ORAL
  Filled 2016-12-22: qty 1

## 2016-12-22 MED ORDER — AMOXICILLIN-POT CLAVULANATE 875-125 MG PO TABS: 1.0000 | ORAL_TABLET | Freq: Two times a day (BID) | ORAL | 0 refills | Status: DC

## 2016-12-22 MED FILL — NEO/POLYMYXIN/HC EAR SOLN: 3.5-10000-1 | 20 days supply | Qty: 10 | Fill #0

## 2016-12-22 NOTE — Progress Notes (Signed)
Renee Ho 41 y.o.   Chief Complaint  Patient presents with  . Ear Pain    Right side ear pain   . Hyperglycemia    Glucose 341 at 0830 - pt has not ate this am, had diet dr. Malachi Bonds    . Nausea    HISTORY OF PRESENT ILLNESS: This is a 41 y.o. female complaining of pain to right ear; diabetic, also c/o dizziness and nausea; high glucose this am. Ear pain started 1-2 weeks ago without trauma.  HPI   Prior to Admission medications   Medication Sig Start Date End Date Taking? Authorizing Provider  atorvastatin (LIPITOR) 10 MG tablet Take 1 tablet (10 mg total) by mouth daily. 12/09/16  Yes Wendie Agreste, MD  blood glucose meter kit and supplies KIT Dispense based on patient and insurance preference. Use up to four times daily as directed. (FOR ICD-9 250.00, 250.01). 12/09/16  Yes Wendie Agreste, MD  DULoxetine (CYMBALTA) 60 MG capsule TAKE 1 CAPSULE BY MOUTH DAILY. 12/09/16  Yes Wendie Agreste, MD  glucose blood test strip Use as instructed 12/09/16  Yes Wendie Agreste, MD  hydrochlorothiazide (HYDRODIURIL) 25 MG tablet Take 1 tablet (25 mg total) by mouth daily. 12/09/16  Yes Wendie Agreste, MD  insulin glargine (LANTUS) 100 UNIT/ML injection INJECT 50 UNITS INTO THE SKIN AT BEDTIME. 12/09/16  Yes Wendie Agreste, MD  Insulin Syringe-Needle U-100 (TRUEPLUS INSULIN SYRINGE) 30G X 5/16" 0.5 ML MISC USE AS DIRECTED EVERY DAY 12/09/16  Yes Wendie Agreste, MD  JANUVIA 100 MG tablet Take 1 tablet (100 mg total) by mouth daily. 12/09/16  Yes Wendie Agreste, MD  lisinopril (PRINIVIL,ZESTRIL) 5 MG tablet Take 1 tablet (5 mg total) by mouth daily. 12/09/16  Yes Wendie Agreste, MD  metFORMIN (GLUCOPHAGE) 1000 MG tablet TAKE 1 TABLET (1,000 MG TOTAL) BY MOUTH 2 (TWO) TIMES DAILY WITH A MEAL. 12/09/16  Yes Wendie Agreste, MD  neomycin-polymyxin-hydrocortisone (CORTISPORIN) 3.5-10000-1 otic suspension Place 4 drops into the right ear 4 (four) times daily. Patient not taking: Reported on 12/22/2016  12/21/16   Mary-Margaret Hassell Done, FNP    Allergies  Allergen Reactions  . Morphine And Related Other (See Comments)    Migraines  . Reglan [Metoclopramide] Other (See Comments)    Psychotic episodes  . Toradol [Ketorolac Tromethamine] Rash    Patient Active Problem List   Diagnosis Date Noted  . Injury of foot, left 07/18/2013  . Dysuria 05/22/2013  . Preventive measure 05/22/2013  . HTN (hypertension) 09/18/2012  . Morbid obesity ([place]) 09/18/2012  . S/P hysterectomy 06/20/2012  . H/O colonoscopy with polypectomy 06/20/2012  . Thrush, oral 06/19/2012  . Diverticulitis 06/13/2012  . Depression 12/30/2011  . Plantar fasciitis 04/22/2011  . HYPERTRIGLYCERIDEMIA 02/21/2008  . DIABETES MELLITUS, TYPE II 02/24/2007  . POLYCYSTIC OVARIAN DISEASE 02/24/2007  . HYPERTENSION 02/24/2007  . Sleep apnea 02/24/2007    Past Medical History:  Diagnosis Date  . Depression   . Diabetes mellitus   . Diabetes mellitus without complication ([place])   . Diverticula of intestine   . High cholesterol   . Hypertension   . Polycystic ovarian syndrome   . Sleep apnea     Past Surgical History:  Procedure Laterality Date  . ABDOMINAL HYSTERECTOMY    . CYST REMOVAL NECK    . KNEE ARTHROSCOPY  07/11/2012   Procedure: ARTHROSCOPY KNEE;  Surgeon: Meredith Pel, MD;  Location: [facility];  Service: Orthopedics;  Laterality:  Right;  Right knee arthroscopy with debridement  . KNEE SURGERY    . OVARIAN CYST REMOVAL      Social History   Social History  . Marital status: Married    Spouse name: N/A  . Number of children: 1  . Years of education: College   Occupational History  . Not on file.   Social History Main Topics  . Smoking status: Current Some Day Smoker    Packs/day: 1.00    Types: Cigarettes  . Smokeless tobacco: Never Used  . Alcohol use No     Comment: Rarely.  . Drug use: No  . Sexual activity: No   Other Topics Concern  . Not on file   Social History Narrative   **  Merged History Encounter **   Drinks diet Dr. Malachi Bonds about 1 a day.         Family History  Problem Relation Age of Onset  . Cancer Mother   . Mental illness Mother   . Cancer Maternal Grandfather   . Stroke Paternal Grandmother      Review of Systems  Constitutional: Negative for chills and fever.  HENT: Positive for ear pain. Negative for nosebleeds, sinus pain and sore throat.   Eyes: Negative for blurred vision and double vision.  Respiratory: Negative for cough and shortness of breath.   Cardiovascular: Negative for chest pain and palpitations.  Gastrointestinal: Positive for nausea. Negative for abdominal pain, diarrhea and vomiting.  Skin: Negative for rash.  Neurological: Positive for dizziness and weakness. Negative for headaches.  All other systems reviewed and are negative.  Vitals:   12/22/16 0945 12/22/16 0951  BP: 114/70 136/80  Pulse: 96   Resp: 16   Temp: 98.8 F (37.1 C)      Physical Exam  Constitutional: She is oriented to person, place, and time. She appears well-developed and well-nourished. She appears ill.  HENT:  Head: Normocephalic and atraumatic.  Right Ear: There is swelling (external canal) and tenderness (external canal). Tympanic membrane is injected.  Left Ear: Hearing, tympanic membrane, external ear and ear canal normal.  Mouth/Throat: No oropharyngeal exudate.  Eyes: Conjunctivae and EOM are normal. Pupils are equal, round, and reactive to light.  Neck: Normal range of motion. Neck supple. No JVD present. No thyromegaly present.  Cardiovascular: Normal rate, regular rhythm and normal heart sounds.   Pulmonary/Chest: Effort normal and breath sounds normal.  Abdominal: Soft. Bowel sounds are normal. She exhibits no distension. There is no tenderness.  Musculoskeletal: Normal range of motion.  Lymphadenopathy:    She has no cervical adenopathy.  Neurological: She is alert and oriented to person, place, and time. No sensory deficit. She  exhibits normal muscle tone.  Skin: Skin is warm and dry. Capillary refill takes less than 2 seconds.  Psychiatric: She has a normal mood and affect. Her behavior is normal.  Vitals reviewed.    ASSESSMENT & PLAN: Given patient's DM Hx and immunocompromised status, pt's ear infection and symptomatic uncontrolled hyperglycemia should be further evaluated in the ED Pt needs IV treatment, stat lab work, and diagnostic imaging to r/o mastoiditis among other possibilities. Jen[place] was seen today for ear pain, hyperglycemia and nausea.  Diagnoses and all orders for this visit:  Acute ear infection, right Comments: r/o malignant otitis externa  Hyperglycemia  Type 2 diabetes mellitus with hyperglycemia, with long-term current use of insulin (HCC)  Ear pain, right Comments: r/o mastoiditis  Nausea without vomiting   To ER now.  Agustina Caroli, MD [facility]

## 2016-12-22 NOTE — ED Provider Notes (Addendum)
[facility] DEPT Provider Note   CSN: [id number] Arrival date & time: 12/22/16  1102     History   Chief Complaint Chief Complaint  Patient presents with  . Otalgia  . Dizziness    HPI Renee Ho is a 41 y.o. female.  HPI Patient presents the emergency department 2 weeks of increasing right-sided ear pain and swelling and pain behind her right auricle.  She was seen in urgent care and sent down to the emergency department for further evaluation for suspected mastoiditis.  No sore throat.  No dental pain.  She reports feeling somewhat dizzy.  Her pain is moderate in severity and worse with palpation behind her right ear.  She does have a history of diabetes  Past Medical History:  Diagnosis Date  . Depression   . Diabetes mellitus   . Diabetes mellitus without complication ([place])   . Diverticula of intestine   . High cholesterol   . Hypertension   . Polycystic ovarian syndrome   . Sleep apnea     Patient Active Problem List   Diagnosis Date Noted  . Hyperglycemia 12/22/2016  . Acute ear infection, right 12/22/2016  . Ear pain, right 12/22/2016  . Injury of foot, left 07/18/2013  . Dysuria 05/22/2013  . Preventive measure 05/22/2013  . HTN (hypertension) 09/18/2012  . Morbid obesity ([place]) 09/18/2012  . S/P hysterectomy 06/20/2012  . H/O colonoscopy with polypectomy 06/20/2012  . Thrush, oral 06/19/2012  . Diverticulitis 06/13/2012  . Depression 12/30/2011  . Plantar fasciitis 04/22/2011  . HYPERTRIGLYCERIDEMIA 02/21/2008  . DIABETES MELLITUS, TYPE II 02/24/2007  . POLYCYSTIC OVARIAN DISEASE 02/24/2007  . HYPERTENSION 02/24/2007  . Sleep apnea 02/24/2007    Past Surgical History:  Procedure Laterality Date  . ABDOMINAL HYSTERECTOMY    . CYST REMOVAL NECK    . KNEE ARTHROSCOPY  07/11/2012   Procedure: ARTHROSCOPY KNEE;  Surgeon: Meredith Pel, MD;  Location: [facility];  Service: Orthopedics;  Laterality: Right;  Right knee arthroscopy with debridement    . KNEE SURGERY    . OVARIAN CYST REMOVAL      OB History    Gravida Para Term Preterm AB Living   0 0 0 0 0     SAB TAB Ectopic Multiple Live Births   0 0 0           Home Medications    Prior to Admission medications   Medication Sig Start Date End Date Taking? Authorizing Provider  atorvastatin (LIPITOR) 10 MG tablet Take 1 tablet (10 mg total) by mouth daily. 12/09/16  Yes Wendie Agreste, MD  DULoxetine (CYMBALTA) 60 MG capsule TAKE 1 CAPSULE BY MOUTH DAILY. 12/09/16  Yes Wendie Agreste, MD  hydrochlorothiazide (HYDRODIURIL) 25 MG tablet Take 1 tablet (25 mg total) by mouth daily. 12/09/16  Yes Wendie Agreste, MD  insulin glargine (LANTUS) 100 UNIT/ML injection INJECT 50 UNITS INTO THE SKIN AT BEDTIME. Patient taking differently: Inject 54 Units into the skin at bedtime.  12/09/16  Yes Wendie Agreste, MD  JANUVIA 100 MG tablet Take 1 tablet (100 mg total) by mouth daily. 12/09/16  Yes Wendie Agreste, MD  lisinopril (PRINIVIL,ZESTRIL) 5 MG tablet Take 1 tablet (5 mg total) by mouth daily. 12/09/16  Yes Wendie Agreste, MD  metFORMIN (GLUCOPHAGE) 1000 MG tablet TAKE 1 TABLET (1,000 MG TOTAL) BY MOUTH 2 (TWO) TIMES DAILY WITH A MEAL. 12/09/16  Yes Wendie Agreste, MD  blood glucose meter kit and  supplies KIT Dispense based on patient and insurance preference. Use up to four times daily as directed. (FOR ICD-9 250.00, 250.01). Patient not taking: Reported on 12/22/2016 12/09/16   Wendie Agreste, MD  glucose blood test strip Use as instructed Patient not taking: Reported on 12/22/2016 12/09/16   Wendie Agreste, MD  Insulin Syringe-Needle U-100 (TRUEPLUS INSULIN SYRINGE) 30G X 5/16" 0.5 ML MISC USE AS DIRECTED EVERY DAY Patient not taking: Reported on 12/22/2016 12/09/16   Wendie Agreste, MD  neomycin-polymyxin-hydrocortisone (CORTISPORIN) 3.5-10000-1 otic suspension Place 4 drops into the right ear 4 (four) times daily. Patient not taking: Reported on 12/22/2016 12/21/16   Mary-Margaret  Hassell Done, FNP    Family History Family History  Problem Relation Age of Onset  . Cancer Mother   . Mental illness Mother   . Cancer Maternal Grandfather   . Stroke Paternal Grandmother     Social History Social History  Substance Use Topics  . Smoking status: Current Some Day Smoker    Packs/day: 1.00    Types: Cigarettes  . Smokeless tobacco: Never Used  . Alcohol use No     Comment: Rarely.     Allergies   Morphine and related; Reglan [metoclopramide]; and Toradol [ketorolac tromethamine]   Review of Systems Review of Systems  All other systems reviewed and are negative.    Physical Exam Updated Vital Signs BP 123/57   Pulse 67   Temp 97.9 F (36.6 C) (Oral)   Resp 21   Ht '5\' 5"'  (1.651 m)   Wt 273 lb (123.8 kg)   SpO2 95%   BMI 45.43 kg/m   Physical Exam  Constitutional: She is oriented to person, place, and time. She appears well-developed and well-nourished. No distress.  HENT:  Head: Normocephalic.  Mild tenderness of right mastoid without significant swelling or erythema.  No cervical lymphadenopathy.  Mild fullness of the right TM with some erythema.  No signs of otitis externa.  Left TM is normal.  Uvula is midline.  Posterior pharynx is normal.  Dentition is without focal abnormality  Eyes: EOM are normal.  Neck: Normal range of motion.  Cardiovascular: Normal rate, regular rhythm and normal heart sounds.   Pulmonary/Chest: Effort normal and breath sounds normal.  Abdominal: Soft. She exhibits no distension. There is no tenderness.  Musculoskeletal: Normal range of motion.  Neurological: She is alert and oriented to person, place, and time.  Skin: Skin is warm and dry.  Psychiatric: She has a normal mood and affect. Judgment normal.  Nursing note and vitals reviewed.    ED Treatments / Results  Labs (all labs ordered are listed, but only abnormal results are displayed) Labs Reviewed  CBC WITH DIFFERENTIAL/PLATELET - Abnormal; Notable for the  following:       Result Value   Hemoglobin 15.7 (*)    All other components within normal limits  BASIC METABOLIC PANEL - Abnormal; Notable for the following:    Chloride 98 (*)    Glucose, Bld 321 (*)    All other components within normal limits    EKG  EKG Interpretation None       Radiology No results found.  Procedures Procedures (including critical care time)  Medications Ordered in ED Medications  iopamidol (ISOVUE-300) 61 % injection (not administered)  ondansetron (ZOFRAN-ODT) disintegrating tablet 4 mg (4 mg Oral Given 12/22/16 1303)  Ampicillin-Sulbactam (UNASYN) 3 g in sodium chloride 0.9 % 100 mL IVPB (0 g Intravenous Stopped 12/22/16 1458)  HYDROmorphone (DILAUDID)  injection 1 mg (1 mg Intravenous Given 12/22/16 1404)  LORazepam (ATIVAN) injection 1 mg (1 mg Intravenous Given 12/22/16 1538)  HYDROmorphone (DILAUDID) injection 0.5 mg (0.5 mg Intravenous Given 12/22/16 1538)     Initial Impression / Assessment and Plan / ED Course  I have reviewed the triage vital signs and the nursing notes.  Pertinent labs & imaging results that were available during my care of the patient were reviewed by me and considered in my medical decision making (see chart for details).     Patient is received IV Unasyn this time.  She will undergo CT temporal bones to evaluate for mastoiditis.  If the head CT is negative the patient can safely be discharged home with Augmentin and ENT follow-up.  Final Clinical Impressions(s) / ED Diagnoses   Final diagnoses:  None    New Prescriptions New Prescriptions   No medications on file     Jola Schmidt, MD 12/22/16 [place], MD 12/22/16 [phone]

## 2016-12-22 NOTE — Discharge Instructions (Signed)
Take acetaminophen and/or ibuprofen as needed for less severe pain. °

## 2016-12-22 NOTE — ED Triage Notes (Signed)
Pt sent from PCP for right sided ear pain and swelling. Pt also reports dizziness with nausea.

## 2016-12-22 NOTE — ED Provider Notes (Signed)
Patient signed out to me pending CT scan looking for evidence of mastoiditis. CT shows no evidence of mastoiditis, but swelling around the right external auditory canal. Review of physical exam shows right otitis media. She probably is having a combination of right otitis media and externa. She is discharged with prescription for amoxicillin-clavulanic acid and is given ENT follow-up in one week. Also given prescription for oxycodone have acetaminophen for pain.  Results for orders placed or performed during the hospital encounter of 12/22/16  CBC with Differential/Platelet  Result Value Ref Range   WBC 10.2 4.0 - 10.5 K/uL   RBC 5.02 3.87 - 5.11 MIL/uL   Hemoglobin 15.7 (H) 12.0 - 15.0 g/dL   HCT 40.945.5 81.136.0 - 91.446.0 %   MCV 90.6 78.0 - 100.0 fL   MCH 31.3 26.0 - 34.0 pg   MCHC 34.5 30.0 - 36.0 g/dL   RDW 78.213.8 95.611.5 - 21.315.5 %   Platelets 188 150 - 400 K/uL   Neutrophils Relative % 66 %   Neutro Abs 6.7 1.7 - 7.7 K/uL   Lymphocytes Relative 26 %   Lymphs Abs 2.7 0.7 - 4.0 K/uL   Monocytes Relative 6 %   Monocytes Absolute 0.6 0.1 - 1.0 K/uL   Eosinophils Relative 2 %   Eosinophils Absolute 0.2 0.0 - 0.7 K/uL   Basophils Relative 0 %   Basophils Absolute 0.0 0.0 - 0.1 K/uL  Basic metabolic panel  Result Value Ref Range   Sodium 135 135 - 145 mmol/L   Potassium 3.9 3.5 - 5.1 mmol/L   Chloride 98 (L) 101 - 111 mmol/L   CO2 27 22 - 32 mmol/L   Glucose, Bld 321 (H) 65 - 99 mg/dL   BUN 9 6 - 20 mg/dL   Creatinine, Ser 0.860.77 0.44 - 1.00 mg/dL   Calcium 9.7 8.9 - 57.810.3 mg/dL   GFR calc non Af Amer >60 >60 mL/min   GFR calc Af Amer >60 >60 mL/min   Anion gap 10 5 - 15   Ct Temporal Bones W Contrast  Result Date: 12/22/2016 CLINICAL DATA:  41 y/o F; right-sided ear pain, dizziness, nausea, and vomiting. EXAM: CT TEMPORAL BONES WITH CONTRAST TECHNIQUE: Axial and coronal plane CT imaging of the petrous temporal bones was performed with thin-collimation image reconstruction after intravenous  contrast administration. Multiplanar CT image reconstructions were also generated. CONTRAST:  1 ISOVUE-300 IOPAMIDOL (ISOVUE-300) INJECTION 61% COMPARISON:  04/05/2016 CT of the head. FINDINGS: Right ear: Soft tissue thickening of the external auditory canal without osseous erosion. Normal tympanic membrane. No blunting of the scutum. The ossicles are intact without gross erosion. Normal course of the facial nerve. Normal semicircular canals and vestibulocochlear apparatus. Normally aerated mastoid air cells and middle ear. Left ear: Soft tissue thickening of the external auditory canal without osseous erosion. Normal tympanic membrane. No blunting of the scutum. The ossicles are intact without gross erosion. Normal course of the facial nerve. Normal semicircular canals and vestibulocochlear apparatus. Normally aerated mastoid air cells and middle ear. Other: Mild frontal sinus and minimal maxillary/sphenoid sinus mucosal thickening. No acute process identified in the intracranial compartment. Visualized orbits are unremarkable. IMPRESSION: 1. Normal aeration of the middle ear cavity and mastoid air cells bilaterally. No evidence for mastoiditis. 2. Inner ear structures are morphologically normal. 3. Soft tissue thickening of right greater than left external auditory canals without osseous erosion may represent sequelae of otitis externa. Electronically Signed   By: Mitzi HansenLance  Furusawa-Stratton M.D.   On: 12/22/2016  16:56      Dione Booze, MD 12/22/16 [phone]

## 2016-12-22 NOTE — ED Notes (Signed)
Pt placed on 2L O2 per pt request

## 2016-12-22 NOTE — Patient Instructions (Addendum)
To ER now for further treatment and evaluation.    IF you received an x-ray today, you will receive an invoice from [facility]. Please contact [facility] at [phone] with questions or concerns regarding your invoice.   IF you received labwork today, you will receive an invoice from [facility]. Please contact [facility] at [phone] with questions or concerns regarding your invoice.   Our billing staff will not be able to assist you with questions regarding bills from these companies.  You will be contacted with the lab results as soon as they are available. The fastest way to get your results is to activate your My Chart account. Instructions are located on the last page of this paperwork. If you have not heard from us regarding the results in 2 weeks, please contact this office.

## 2016-12-23 ENCOUNTER — Ambulatory Visit: Payer: 59 | Admitting: Family Medicine

## 2016-12-23 MED FILL — AMOX-CLAV 875-125 MG TABLET: 875-125 | 7 days supply | Qty: 14 | Fill #0

## 2016-12-23 MED FILL — OXYCODONE W/APAP 5/325 TAB: 5-325 | 2 days supply | Qty: 10 | Fill #0

## 2016-12-27 MED FILL — FREESTYLE LITE METER: 1 days supply | Qty: 1 | Fill #0

## 2017-01-13 MED FILL — LANTUS 100 UNITS/ML VIAL: 100 | 40 days supply | Qty: 20 | Fill #1

## 2017-01-31 MED FILL — DULoxetine HCL 60 MG CPEP: 60 | 90 days supply | Qty: 90 | Fill #0

## 2017-01-31 MED FILL — ATORVASTATIN 10 MG TABLET: 10 | 90 days supply | Qty: 90 | Fill #0

## 2017-02-14 MED FILL — LANTUS 100 UNITS/ML VIAL: 100 | 40 days supply | Qty: 20 | Fill #2

## 2017-03-14 MED FILL — JANUVIA 100 MG TABLET: 100 | 90 days supply | Qty: 90 | Fill #1

## 2017-03-14 MED FILL — metFORMIN HCL 1000 MG TABS: 1000 | 90 days supply | Qty: 180 | Fill #1

## 2017-03-14 MED FILL — HYDROCHLOROTHIAZIDE 25 MG T: 25 | 90 days supply | Qty: 90 | Fill #1

## 2017-03-14 MED FILL — LISINOPRIL 5 MG TABLET: 5 | 90 days supply | Qty: 90 | Fill #1

## 2017-03-21 ENCOUNTER — Telehealth: Payer: Self-pay

## 2017-03-21 NOTE — Patient Outreach (Signed)
Called [facility] member to schedule a follow up appointment, there was no answer, left message to call me back. 

## 2017-03-23 MED FILL — ULTICARE SYR 0.5 ML 30GX5/1: 30G X 5/16" | 30 days supply | Qty: 100 | Fill #1

## 2017-03-23 MED FILL — LANTUS 100 UNITS/ML VIAL: 100 | 40 days supply | Qty: 20 | Fill #3

## 2017-03-25 MED FILL — PENICILLIN VK 500 MG TABLET: 500 | 6 days supply | Qty: 24 | Fill #0

## 2017-03-25 MED FILL — traMADol HCL 50 MG TABS: 50 | 5 days supply | Qty: 20 | Fill #0

## 2017-05-02 ENCOUNTER — Encounter: Payer: Self-pay | Admitting: Physician Assistant

## 2017-05-02 ENCOUNTER — Ambulatory Visit (INDEPENDENT_AMBULATORY_CARE_PROVIDER_SITE_OTHER): Payer: 59 | Admitting: Physician Assistant

## 2017-05-02 VITALS — BP 121/79 | HR 75 | Temp 98.6°F | Resp 16 | Ht 66.93 in | Wt 277.6 lb

## 2017-05-02 DIAGNOSIS — L723 Sebaceous cyst: Secondary | ICD-10-CM | POA: Diagnosis not present

## 2017-05-02 MED FILL — ULTICARE SYR 0.5 ML 30GX5/1: 30G X 5/16" | 30 days supply | Qty: 100 | Fill #2

## 2017-05-02 MED FILL — LANTUS 100 UNITS/ML VIAL: 100 | 40 days supply | Qty: 20 | Fill #0

## 2017-05-02 NOTE — Progress Notes (Signed)
    05/02/2017 11:56 AM   DOB: 08/23/1976 / MRN: [id number]  SUBJECTIVE:  Renee Ho is a 41 y.o. female presenting for abscess and sleep apnea mask.  Tells me she has had an abscess about her right ribs just under her lateral breast and she has been able to express this in the past. Denies any rash or fever today.   She would [facility].   She has several chronic health problems and is established with Dr.Greene.  She would like to see him back in mid September.   She is allergic to morphine and related; reglan [metoclopramide]; and toradol [ketorolac tromethamine].   She  has a past medical history of Depression; Diabetes mellitus; Diabetes mellitus without complication ([facility]); Diverticula of intestine; High cholesterol; Hypertension; Polycystic ovarian syndrome; and Sleep apnea.    She  reports that she has been smoking Cigarettes.  She has been smoking about 1.00 pack per day. She has never used smokeless tobacco. She reports that she does not drink alcohol or use drugs. She  reports that she does not engage in sexual activity. The patient  has a past surgical history that includes Ovarian cyst removal; Knee arthroscopy (07/11/2012); Abdominal hysterectomy; Cyst removal neck; and Knee surgery.  Her family history includes Cancer in her maternal grandfather and mother; Mental illness in her mother; Stroke in her paternal grandmother.  Review of Systems  Constitutional: Negative for chills and fever.  Skin: Positive for rash. Negative for itching.  Psychiatric/Behavioral: Negative for depression.    The problem list and medications were reviewed and updated by myself where necessary and exist elsewhere in the encounter.   OBJECTIVE:  BP 121/79 (BP Location: Right Arm, Patient Position: Sitting, Cuff Size: Large)   Pulse 75   Temp 98.6 F (37 C) (Oral)   Resp 16   Ht 5' 6.93" (1.7 m)   Wt 277 lb 9.6 oz (125.9 kg)    SpO2 96%   BMI 43.57 kg/m   Physical Exam  Constitutional: She is active.  Non-toxic appearance.  Cardiovascular: Normal rate.   Pulmonary/Chest: Effort normal. No tachypnea.    Neurological: She is alert.  Skin: Skin is warm and dry. She is not diaphoretic. No pallor.   Risk and benefits discussed and verbal consent obtained. Anesthetic allergies reviewed. Patient anesthetized using 1:1 mix of 2% lidocaine with epi. A 1 cm incision was made using a number 15 blade and no cyst sac was found.  The was not wound packed. The patient tolerated the procedure without difficulty.      No results found for this or any previous visit (from the past 72 hour(s)).  No results found.  ASSESSMENT AND PLAN:  Renee Ho was seen today for medication refill and abscess.  Diagnoses and all orders for this visit:  Sebaceous cyst Comments: Unfortunately I was unable to drain the cyst and given the location was concerned with going any deeper into the skin.  Advised warm compress and hopefully the cyst sac will erupt and drain.     The patient is advised to call or return to clinic if she does not see an improvement in symptoms, or to seek the care of the closest emergency department if she worsens with the above plan.   Deliah BostonMichael Cedrik Heindl, MHS, PA-C [facility] at [facility] 05/02/2017 11:56 AM

## 2017-05-02 NOTE — Patient Instructions (Addendum)
     IF you received an x-ray today, you will receive an invoice from [facility]. Please contact [facility] at [phone] with questions or concerns regarding your invoice.   IF you received labwork today, you will receive an invoice from [facility]. Please contact [facility] at [phone] with questions or concerns regarding your invoice.   Our billing staff will not be able to assist you with questions regarding bills from these companies.  You will be contacted with the lab results as soon as they are available. The fastest way to get your results is to activate your My Chart account. Instructions are located on the last page of this paperwork. If you have not heard from us regarding the results in 2 weeks, please contact this office.    We recommend that you schedule a mammogram for breast cancer screening. Typically, you do not need a referral to do this. Please contact a local imaging center to schedule your mammogram.  [facility] - [phone]  *ask for the Radiology Department [facility] - [phone] or [phone]  [facility] - [phone] [facility] - [phone] [facility] - [phone]  *ask for the Radiology Department [facility] - [phone]  *ask for the Radiology Department [facility] - [phone]  *ask for the Mammography Department [facility] - [phone] 

## 2017-05-03 ENCOUNTER — Other Ambulatory Visit: Payer: Self-pay | Admitting: *Deleted

## 2017-05-03 ENCOUNTER — Encounter: Payer: Self-pay | Admitting: *Deleted

## 2017-05-03 VITALS — BP 112/70 | Ht 66.0 in | Wt 281.2 lb

## 2017-05-03 DIAGNOSIS — E119 Type 2 diabetes mellitus without complications: Secondary | ICD-10-CM

## 2017-05-03 DIAGNOSIS — Z794 Long term (current) use of insulin: Principal | ICD-10-CM

## 2017-05-03 LAB — POCT GLYCOSYLATED HEMOGLOBIN (HGB A1C): HEMOGLOBIN A1C: 10.4

## 2017-05-03 LAB — POCT CBG (FASTING - GLUCOSE)-MANUAL ENTRY: GLUCOSE FASTING, POC: 281 mg/dL — AB (ref 70–99)

## 2017-05-04 ENCOUNTER — Encounter: Payer: Self-pay | Admitting: *Deleted

## 2017-05-04 MED FILL — ATORVASTATIN 10 MG TABLET: 10 | 90 days supply | Qty: 90 | Fill #1

## 2017-05-04 MED FILL — DULoxetine HCL 60 MG CPEP: 60 | 90 days supply | Qty: 90 | Fill #1

## 2017-05-04 NOTE — Patient Outreach (Signed)
[facility]) Care Management   05/03/2017  Renee Ho June 28, 1976 [id number]  Renee Ho"  Renee Ho is an 41 y.o. female who presents to the [facility] office for routine Link To Wellness follow up for self management assistance with Type II DM, HTN, hyperlipidemia and obesity. Renee Ho is a 7 year [employer]. 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    Subjective:  Renee Ho says she was diagnosed with diabetes many years ago (her medical record says it was in 2005) and she rarely checks her blood sugar because "it is always high". She says she does not understand why her Hgb A1C remains elevated despite her consistent mediation adherence and adherence to a CHO controlled meal plan more than 50% of the time. She says she walks her two dogs for 45 minutes every other day. She says she has been obese since after the birth of her only daughter over 20 years ago.  She says she has been smoke free since 5/10 and she has smoked since she was a teenager.  She is requesting a point of care Hgb A1C today as she says she thinks it will have improved due to her consistent medication adherence. She says she was at her provider's office yesterday to have a cyst under her right breast drained.She was told to leave the pressure dressing on for 24 hours.  She is complaining of skin irritation on her face related to her CPAP mask and says she needs new tubing, head gear and mask and that she asked her MD to send a Rx to the DME provider [facility] but she has not heard anything.   Objective:   This is our first meeting as Renee Ho was previously seen for Link To wellness follow up by Tilman Neat, Pharm D at [place].   Review of Systems  Constitutional: Negative.     Physical Exam  Constitutional: She is oriented to person, place, and time. She appears  well-developed and well-nourished.  Respiratory: Effort normal.  Neurological: She is alert and oriented to person, place, and time.  Skin: Skin is warm and dry.     Psychiatric: She has a normal mood and affect. Her behavior is normal. Judgment and thought content normal.   Today's Vitals   05/03/17 0915  BP: 112/70  Weight: 281 lb 3.2 oz (127.6 kg)  Height: 1.676 m ('5\' 6"' )  POC fasting CBG= 281 POC Hgb A1C= 10.4%- patient became tearful when the results were shared with her  Encounter Medications:   Outpatient Encounter Prescriptions as of 05/03/2017  Medication Sig Note  . atorvastatin (LIPITOR) 10 MG tablet Take 1 tablet (10 mg total) by mouth daily.   . DULoxetine (CYMBALTA) 60 MG capsule TAKE 1 CAPSULE BY MOUTH DAILY.   . hydrochlorothiazide (HYDRODIURIL) 25 MG tablet Take 1 tablet (25 mg total) by mouth daily.   . insulin glargine (LANTUS) 100 UNIT/ML injection INJECT 50 UNITS INTO THE SKIN AT BEDTIME. (Patient taking differently: Inject 54 Units into the skin at bedtime. ) 05/03/2017: 55 units  . JANUVIA 100 MG tablet Take 1 tablet (100 mg total) by mouth daily.   Renee Ho lisinopril (PRINIVIL,ZESTRIL) 5 MG tablet Take 1 tablet (5 mg total) by mouth daily.   . metFORMIN (GLUCOPHAGE) 1000 MG tablet TAKE 1 TABLET (1,000 MG TOTAL) BY MOUTH 2 (TWO) TIMES DAILY WITH A MEAL. 05/03/2017: Takes  in morning and at bedtime  . blood glucose meter kit and supplies KIT Dispense based on patient and insurance preference. Use up to four times daily as directed. (FOR ICD-9 250.00, 250.01).   Renee Ho glucose blood test strip Use as instructed   . Insulin Syringe-Needle U-100 (TRUEPLUS INSULIN SYRINGE) 30G X 5/16" 0.5 ML MISC USE AS DIRECTED EVERY DAY    No facility-administered encounter medications on file as of 05/03/2017.     Functional Status:   In your present state of health, do you have any difficulty performing the following activities: 05/03/2017  Hearing? N  Vision? N  Difficulty concentrating or  making decisions? N  Walking or climbing stairs? N  Dressing or bathing? N  Doing errands, shopping? N  Preparing Food and eating ? N  Using the Toilet? N  In the past six months, have you accidently leaked urine? N  Do you have problems with loss of bowel control? N  Managing your Medications? N  Housekeeping or managing your Housekeeping? N  Some recent data might be hidden    Fall/Depression Screening:    Fall Risk  05/03/2017 05/02/2017 12/22/2016  Falls in the past year? No No No   PHQ 2/9 Scores 05/03/2017 05/02/2017 12/22/2016 12/09/2016 10/19/2016 05/29/2016 05/27/2016  PHQ - 2 Score 0 0 0 0 0 6 6  PHQ- 9 Score - - - - - 15 16  Exception Documentation - - - - - - -  Not completed - - - - - - -    Assessment:  [employer] employee and Link To Wellness member with Type II DM, HTN, hyperlipidemia and obesity meeting treatment targets for HTN only.   Plan:  [facility] CM Care Plan Problem One     Most Recent Value  Care Plan Problem One Patient with Type II DM and on basal insulin and oral medications, also with HTN, hyperlipidemia and morbid obesity meeting treatment target for HTN only. Current body mass index= 45.5, lipid panel of   Role Documenting the Problem One  Care Management Coordinator  Care Plan for Problem One  Active  [facility] Long Term Goal Patient will demonstrate improved glycemic control as evidenced by improved Hgb A1C at next assessment,  she will continue to exercise and follow a CHO controlled meal plan, she will demonstrate ongoing good control of HTN as evidenced by average BP readings <140/<90, improvement of obesity management as evidenced by no weight gain or weight loss at next assessment, improved lipid management, as evidenced by improved lipid profile at next assessment, she will obtain new CPAP equipment from [place] Term Goal Start Date  05/03/17     Interventions for Problem One Long Term Goal Extensively reviewed Leann's health history, assessed  her current knowledge of diabetes, using the [place]ron[employer] Ominous Octet representation discussed the 8 core pathophysiologic deficits in Type II diabetes. Discussed physiology of diabetes as a chronic progressive disease with the initial problem of insulin resistance in the muscle, liver and fat cells and then increased loss of beta cell function over time resulting in decreased insulin production, discussed role of obesity, especially abdominal (visceral) obesity, on insulin resistance, reviewed patient's medications and assessed medication adherence, discussed DM medications of Lantus, Januvia and Metformin including the mechanism of action, common side effects, dosages and dosing schedule, reviewed the onset, peak (none) and duration of Lantus,discussed the need for the use of a combination of diabetes medications to correct the pathophysiologic core deficits and to  prevent or slow beta cell failure, discussed role of statins, aspirin, and blood pressure medicines in the treatment of diabetes, provided education on the three primary macronutrients (CHO, protein, fat) and their effect on glucose levels,  inquired if she has ever been on mealtime insulin, discussed other diabetes medication options to help her improve glycemic control such as GLP1 agonist and SGLT2 inhibitor and their mechanism of action and common side effects and Hgb A1C lowering capability,  assessed POC Hgb A1C and POC fasting CBG and discussed results and targets, provided emotional support to [facility] and assured her that the increase in her Hgb A1C is most likely due to insulin insuffiencey and not as a result of her lifestyle habits, since Leann does not like to stick her fingers discussed the Freestyle Libre flash glucose monitoring system and approximate out of pocket costs, assessed 24 hour diet history, congratulated her on making healthy choices most of the time and  discussed specific strategies to reduce CHO intake, provided her with list  of food with high, medium and low glycemic index and suggested she post on her refrigerator door for easy reference to help her with food choices, discussed effects of physical activity on glucose levels and long-term glucose control by improving insulin sensitivity and assisting with weight management and cardiovascular health, encouraged patient to continue to exercise,assessed patient's hypoglycemia threshold and usual symptoms, discussed causes, other symptoms, and treatment options (Rule of 15s to treat hypoglycemia), discussed the role of stress on blood sugar, discussed strategies to manage stress such as exercise. hobbies, relaxation techniques, scheduling time away from work at regular intervals, group Yoga classes, getting adequate sleep. etc. , discussed recommendations for day to day and long-term diabetes self-care, reviewed lipid profile results of 12/09/16 and discussed specific strategies to increase HDL and lower triglycerides, recommended daily foot checks, and yearly cholesterol, urine, and eye testing, and recommendations for medical, dental, and emotional self-care, contacted [place] to inquire about status of acquiring new CPAP equipment and emailed Leann with information, will arrange for Link To Wellness follow up in 3 months     RNCM to fax today's office visit note to Dr. Carlota Raspberry [facility] will meet at least quarterly and as needed with patient per Link To Wellness program guidelines to assist with Type II DM, HTN, hyperlipidemia and obesity self-management and assess patient's progress toward mutually set goals.

## 2017-05-09 DIAGNOSIS — G4733 Obstructive sleep apnea (adult) (pediatric): Secondary | ICD-10-CM | POA: Diagnosis not present

## 2017-05-09 DIAGNOSIS — G4730 Sleep apnea, unspecified: Secondary | ICD-10-CM | POA: Diagnosis not present

## 2017-05-11 HISTORY — PX: WISDOM TOOTH EXTRACTION: SHX21

## 2017-05-11 MED FILL — IBUPROFEN 400 MG TABS: 400 | 5 days supply | Qty: 30 | Fill #0

## 2017-05-11 MED FILL — CHLORHEXIDINE 0.12% RINSE: 0.12 | 23 days supply | Qty: 473 | Fill #0

## 2017-05-11 MED FILL — HYDROCODON-APAP 5-325: 5-325 | 4 days supply | Qty: 20 | Fill #0

## 2017-07-04 ENCOUNTER — Encounter: Payer: Self-pay | Admitting: Family Medicine

## 2017-07-04 ENCOUNTER — Ambulatory Visit (INDEPENDENT_AMBULATORY_CARE_PROVIDER_SITE_OTHER): Payer: 59 | Admitting: Family Medicine

## 2017-07-04 VITALS — BP 138/82 | HR 75 | Temp 99.0°F | Resp 16 | Ht 66.54 in | Wt 275.0 lb

## 2017-07-04 DIAGNOSIS — E785 Hyperlipidemia, unspecified: Secondary | ICD-10-CM | POA: Diagnosis not present

## 2017-07-04 DIAGNOSIS — Z23 Encounter for immunization: Secondary | ICD-10-CM

## 2017-07-04 DIAGNOSIS — F329 Major depressive disorder, single episode, unspecified: Secondary | ICD-10-CM | POA: Diagnosis not present

## 2017-07-04 DIAGNOSIS — I10 Essential (primary) hypertension: Secondary | ICD-10-CM

## 2017-07-04 DIAGNOSIS — F32A Depression, unspecified: Secondary | ICD-10-CM

## 2017-07-04 DIAGNOSIS — Z794 Long term (current) use of insulin: Secondary | ICD-10-CM

## 2017-07-04 DIAGNOSIS — E1165 Type 2 diabetes mellitus with hyperglycemia: Secondary | ICD-10-CM

## 2017-07-04 LAB — GLUCOSE, POCT (MANUAL RESULT ENTRY): POC GLUCOSE: 333 mg/dL — AB (ref 70–99)

## 2017-07-04 MED ORDER — FREESTYLE LIBRE SENSOR SYSTEM MISC: 0 refills | Status: DC

## 2017-07-04 MED ORDER — DAPAGLIFLOZIN PROPANEDIOL 5 MG PO TABS: 5.0000 mg | ORAL_TABLET | Freq: Every day | ORAL | 2 refills | Status: DC

## 2017-07-04 MED ORDER — ATORVASTATIN CALCIUM 10 MG PO TABS: 10.0000 mg | ORAL_TABLET | Freq: Every day | ORAL | 1 refills | Status: DC

## 2017-07-04 MED ORDER — METFORMIN HCL 1000 MG PO TABS: ORAL_TABLET | ORAL | 1 refills | Status: DC

## 2017-07-04 MED ORDER — HYDROCHLOROTHIAZIDE 25 MG PO TABS: 25.0000 mg | ORAL_TABLET | Freq: Every day | ORAL | 1 refills | Status: DC

## 2017-07-04 MED ORDER — LISINOPRIL 5 MG PO TABS: 5.0000 mg | ORAL_TABLET | Freq: Every day | ORAL | 1 refills | Status: DC

## 2017-07-04 MED ORDER — "INSULIN SYRINGE-NEEDLE U-100 30G X 5/16"" 0.5 ML MISC": 99 refills | Status: DC

## 2017-07-04 MED ORDER — INSULIN GLARGINE 100 UNIT/ML ~~LOC~~ SOLN: 62.0000 [IU] | Freq: Every day | SUBCUTANEOUS | 3 refills | Status: DC

## 2017-07-04 MED ORDER — DULOXETINE HCL 60 MG PO CPEP: ORAL_CAPSULE | ORAL | 1 refills | Status: DC

## 2017-07-04 MED ORDER — JANUVIA 100 MG PO TABS: 100.0000 mg | ORAL_TABLET | Freq: Every day | ORAL | 1 refills | Status: DC

## 2017-07-04 MED FILL — ULTICARE INS SYR 1 ML 30GX1: 30G X 1/2" | 90 days supply | Qty: 100 | Fill #0

## 2017-07-04 MED FILL — LANTUS 100 UNITS/ML VIAL: 100 | 32 days supply | Qty: 20 | Fill #0

## 2017-07-04 MED FILL — metFORMIN HCL 1000 MG TABS: 1000 | 90 days supply | Qty: 180 | Fill #0

## 2017-07-04 MED FILL — LISINOPRIL 5 MG TABLET: 5 | 90 days supply | Qty: 90 | Fill #0

## 2017-07-04 MED FILL — JANUVIA 100 MG TABLET: 100 | 90 days supply | Qty: 90 | Fill #0

## 2017-07-04 MED FILL — HYDROCHLOROTHIAZIDE 25 MG T: 25 | 90 days supply | Qty: 90 | Fill #0

## 2017-07-04 NOTE — Progress Notes (Signed)
Subjective:  By signing my name below, I, [name], attest that this documentation has been prepared under the direction and in the presence of [name], MD. Electronically Signed: [name], [place]. 07/04/2017 , 3:46 PM .  Patient was seen in Room 10 .   Patient ID: Renee Ho, female    DOB: 1976-04-09, 41 y.o.   MRN: [id number] Chief Complaint  Patient presents with  . Hypertension    follow-up  . Hyperlipidemia  . Diabetes   HPI Renee Ho "Renee Ho" is a 41 y.o. female  Here for follow up on DM. I last saw her in March, but she was also seen by [facility] Link to Wellness program. At that time, she was on Lantus 50 units per day, metformin and Januvia. Her blood sugar was slightly low at that visit, as she hadn't eaten that morning. She had A1C of 9.2 in March. Recommended her to increase Lantus to 54 units, eat regular meals throughout the day, and 6 week follow up recommended. Option of seeing endocrinology was also discussed.   This is her first visit back with me, but she did have a Link to Wellness visit on July 24th with Kelli Churn, RN. A1C had increased to 10.4. I sent a note to patient on Aug 5th to follow up in a week or two. Notes from Linked to Wellness reviewed including possible discussion of SGLT2 or GLP1 agonist. Also noted information about Freestyle Libre flash glucose monitoring system.   In July, she was on Lantus 60 units since March. She is still on Januvia 185m QD, and metformin 10031mBID. Her sugars have been running in the 300s (highest at 386), while still taking her medications properly. She denies any symptomatic lows, lowest at 220. She denies abdominal pain or vomiting. She does note having nausea in the mornings when she wakes up; improves after eating breakfast. She denies chest pain, or shortness of breath. She is not fasting today, last meal 3.5 hours ago at noon today.   Lab Results  Component Value Date   HGBA1C 10.4  05/03/2017   Lab Results  Component Value Date   MICROALBUR 0.3 09/22/2015   Optho exam:  Feb 13th Foot exam: March 1st Pneumovax: 2014 Flu vaccine: today, Sept 24th  Cessation from smoking: May 10th, 2018.   Lab Results  Component Value Date   CREATININE 0.77 12/22/2016   She takes HCTZ 2561mD, Lisinopril 5mg35m and statin Lipitor 10mg39m   Depression She takes Cymbalta 60mg 25m  She notes her mood is doing alright, having up days and down days. She had oral surgery recently done on Aug 1st, and still having occasional complications, causing her to become upset. But, she is doing mostly okay.   Depression screen PHQ 2/[facility]/24/2018 05/03/2017 05/02/2017 12/22/2016 12/09/2016  Decreased Interest 0 0 0 0 0  Down, Depressed, Hopeless 0 0 0 0 0  PHQ - 2 Score 0 0 0 0 0  Altered sleeping - - - - -  Tired, decreased energy - - - - -  Change in appetite - - - - -  Feeling bad or failure about yourself  - - - - -  Trouble concentrating - - - - -  Moving slowly or fidgety/restless - - - - -  Suicidal thoughts - - - - -  PHQ-9 Score - - - - -  Difficult doing work/chores - - - - -  Some recent data might  be hidden    Patient Active Problem List   Diagnosis Date Noted  . Hyperglycemia 12/22/2016  . Injury of foot, left 07/18/2013  . HTN (hypertension) 09/18/2012  . Morbid obesity ([place]) 09/18/2012  . S/P hysterectomy 06/20/2012  . H/O colonoscopy with polypectomy 06/20/2012  . Depression 12/30/2011  . Plantar fasciitis 04/22/2011  . HYPERTRIGLYCERIDEMIA 02/21/2008  . DIABETES MELLITUS, TYPE II 02/24/2007  . POLYCYSTIC OVARIAN DISEASE 02/24/2007  . HYPERTENSION 02/24/2007  . Sleep apnea 02/24/2007   Past Medical History:  Diagnosis Date  . Depression   . Diabetes mellitus   . Diabetes mellitus without complication ([place])   . Diverticula of intestine   . High cholesterol   . Hypertension   . OSA on CPAP    CPAP pressure= 15  . Polycystic ovarian syndrome   . Sleep  apnea    on CPAP with pressure setting= 15   Past Surgical History:  Procedure Laterality Date  . ABDOMINAL HYSTERECTOMY    . CYST REMOVAL NECK    . KNEE ARTHROSCOPY  07/11/2012   Procedure: ARTHROSCOPY KNEE;  Surgeon: Meredith Pel, MD;  Location: [facility];  Service: Orthopedics;  Laterality: Right;  Right knee arthroscopy with debridement  . KNEE SURGERY    . OVARIAN CYST REMOVAL    . WISDOM TOOTH EXTRACTION  05/11/2017   Allergies  Allergen Reactions  . Morphine And Related Other (See Comments)    Migraines  . Reglan [Metoclopramide] Other (See Comments)    Psychotic episodes  . Toradol [Ketorolac Tromethamine] Rash   Prior to Admission medications   Medication Sig Start Date End Date Taking? Authorizing Provider  atorvastatin (LIPITOR) 10 MG tablet Take 1 tablet (10 mg total) by mouth daily. 12/09/16   Wendie Agreste, MD  blood glucose meter kit and supplies KIT Dispense based on patient and insurance preference. Use up to four times daily as directed. (FOR ICD-9 250.00, 250.01). 12/09/16   Wendie Agreste, MD  DULoxetine (CYMBALTA) 60 MG capsule TAKE 1 CAPSULE BY MOUTH DAILY. 12/09/16   Wendie Agreste, MD  glucose blood test strip Use as instructed 12/09/16   Wendie Agreste, MD  hydrochlorothiazide (HYDRODIURIL) 25 MG tablet Take 1 tablet (25 mg total) by mouth daily. 12/09/16   Wendie Agreste, MD  insulin glargine (LANTUS) 100 UNIT/ML injection INJECT 50 UNITS INTO THE SKIN AT BEDTIME. Patient taking differently: Inject 54 Units into the skin at bedtime.  12/09/16   Wendie Agreste, MD  Insulin Syringe-Needle U-100 (TRUEPLUS INSULIN SYRINGE) 30G X 5/16" 0.5 ML MISC USE AS DIRECTED EVERY DAY 12/09/16   Wendie Agreste, MD  JANUVIA 100 MG tablet Take 1 tablet (100 mg total) by mouth daily. 12/09/16   Wendie Agreste, MD  lisinopril (PRINIVIL,ZESTRIL) 5 MG tablet Take 1 tablet (5 mg total) by mouth daily. 12/09/16   Wendie Agreste, MD  metFORMIN (GLUCOPHAGE) 1000 MG tablet  TAKE 1 TABLET (1,000 MG TOTAL) BY MOUTH 2 (TWO) TIMES DAILY WITH A MEAL. 12/09/16   Wendie Agreste, MD   Social History   Social History  . Marital status: Married    Spouse name: N/A  . Number of children: 1  . Years of education: College   Occupational History  . Not on file.   Social History Main Topics  . Smoking status: Former Smoker    Packs/day: 1.00    Types: Cigarettes    Quit date: 12/18/2016  . Smokeless tobacco: Never Used  Comment: stopped smoking on 02/17/17  . Alcohol use No     Comment: Rarely.  . Drug use: No  . Sexual activity: No   Other Topics Concern  . Not on file   Social History Narrative   ** Merged History Encounter **   Drinks diet Dr. Malachi Bonds about 1 a day.        Review of Systems  Constitutional: Negative for chills, fatigue, fever and unexpected weight change.  Respiratory: Negative for cough and shortness of breath.   Cardiovascular: Negative for chest pain.  Gastrointestinal: Positive for nausea. Negative for abdominal pain, constipation, diarrhea and vomiting.  Skin: Negative for rash and wound.  Neurological: Negative for dizziness, weakness and headaches.       Objective:   Physical Exam  Constitutional: She is oriented to person, place, and time. She appears well-developed and well-nourished.  HENT:  Head: Normocephalic and atraumatic.  Eyes: Pupils are equal, round, and reactive to light. Conjunctivae and EOM are normal.  Neck: Carotid bruit is not present.  Cardiovascular: Normal rate, regular rhythm, normal heart sounds and intact distal pulses.   Pulmonary/Chest: Effort normal and breath sounds normal.  Abdominal: Soft. She exhibits no pulsatile midline mass. There is no tenderness.  Neurological: She is alert and oriented to person, place, and time.  Skin: Skin is warm and dry.  Psychiatric: She has a normal mood and affect. Her behavior is normal.  Vitals reviewed.   Vitals:   07/04/17 1512  BP: 138/82  Pulse:  75  Resp: 16  Temp: 99 F (37.2 C)  TempSrc: Oral  SpO2: 99%  Weight: 275 lb (124.7 kg)  Height: 5' 6.53" (1.69 m)   Results for orders placed or performed in visit on 07/04/17  POCT glucose (manual entry)  Result Value Ref Range   POC Glucose 333 (A) 70 - 99 mg/dl    Wt Readings from Last 3 Encounters:  07/04/17 275 lb (124.7 kg)  05/03/17 281 lb 3.2 oz (127.6 kg)  05/02/17 277 lb 9.6 oz (125.9 kg)        Assessment & Plan:   RAIA AMICO is a 41 y.o. female Type 2 diabetes mellitus with hyperglycemia, with long-term current use of insulin ([place]) - Plan: POCT glucose (manual entry), C-peptide, Continuous Blood Gluc Sensor (FREESTYLE LIBRE SENSOR SYSTEM) MISC, lisinopril (PRINIVIL,ZESTRIL) 5 MG tablet, insulin glargine (LANTUS) 100 UNIT/ML injection, JANUVIA 100 MG tablet, metFORMIN (GLUCOPHAGE) 1000 MG tablet  - Uncontrolled diabetes. See previous note and note was linked wellness. Unfortunately have not seen her since March of this year, but did have A1c in July.  - She has been trying to adjust diet and activity, weight has decreased since July. Commended on her efforts.  - Check C-peptide to determine if she is trending towards need for mealtime as well as basal insulin.  - Due to elevated fasting readings, will increase basal insulin by 2 units every 3 days until readings are remaining under 200. Hypoglycemic precautions discussed and need for regular meals discussed.  -Will add Farxiga 5 mg daily. Potential side effects discussed. coupon given.  Need for prophylactic vaccination and inoculation against influenza - Plan: Flu Vaccine QUAD 36+ mos IM given  Hyperlipidemia, unspecified hyperlipidemia type - Plan: Comprehensive metabolic panel, Lipid panel  - Check lipids, CMP. Continue Lipitor at current dose  Depression, unspecified depression type - Plan: DULoxetine (CYMBALTA) 60 MG capsule  - Stable. Continue Cymbalta 60 mg daily.  Essential hypertension - Plan:  hydrochlorothiazide (  HYDRODIURIL) 25 MG tablet, lisinopril (PRINIVIL,ZESTRIL) 5 MG tablet  - Borderline, but continue same medications for now. Ideal goal less than 130/80 with diabetes.  Meds ordered this encounter  Medications  . dapagliflozin propanediol (FARXIGA) 5 MG TABS tablet    Sig: Take 5 mg by mouth daily.    Dispense:  30 tablet    Refill:  2  . Continuous Blood Gluc Sensor (FREESTYLE LIBRE SENSOR SYSTEM) MISC    Sig: Dispense #1 for uncontrolled DM2.    Dispense:  1 each    Refill:  0  . atorvastatin (LIPITOR) 10 MG tablet    Sig: Take 1 tablet (10 mg total) by mouth daily.    Dispense:  90 tablet    Refill:  1  . DULoxetine (CYMBALTA) 60 MG capsule    Sig: TAKE 1 CAPSULE BY MOUTH DAILY.    Dispense:  90 capsule    Refill:  1  . hydrochlorothiazide (HYDRODIURIL) 25 MG tablet    Sig: Take 1 tablet (25 mg total) by mouth daily.    Dispense:  90 tablet    Refill:  1  . lisinopril (PRINIVIL,ZESTRIL) 5 MG tablet    Sig: Take 1 tablet (5 mg total) by mouth daily.    Dispense:  90 tablet    Refill:  1  . insulin glargine (LANTUS) 100 UNIT/ML injection    Sig: Inject 0.62 mLs (62 Units total) into the skin daily. INJECT 62 UNITS INTO THE SKIN AT BEDTIME.    Dispense:  20 mL    Refill:  3  . JANUVIA 100 MG tablet    Sig: Take 1 tablet (100 mg total) by mouth daily.    Dispense:  90 tablet    Refill:  1  . metFORMIN (GLUCOPHAGE) 1000 MG tablet    Sig: TAKE 1 TABLET (1,000 MG TOTAL) BY MOUTH 2 (TWO) TIMES DAILY WITH A MEAL.    Dispense:  180 tablet    Refill:  1  . Insulin Syringe-Needle U-100 (TRUEPLUS INSULIN SYRINGE) 30G X 5/16" 0.5 ML MISC    Sig: USE AS DIRECTED EVERY DAY    Dispense:  100 each    Refill:  PRN   Patient Instructions   Check your blood sugar fasting and 2 hours after meals. Oh dictated a check once or twice per day at various times and bring a record of those readings in the next 6 weeks. For now increase Lantus to 62 units then additional 2  units every 3 days until your blood sugars remain under 200. Start Wilder Glade once per day. Depending on blood work, we can discuss mealtime insulin if needed next visit. We can also recheck your hemoglobin A1c at next visit.    IF you received an x-ray today, you will receive an invoice from [facility]. Please contact [facility] at [phone] with questions or concerns regarding your invoice.   IF you received labwork today, you will receive an invoice from [place] [facility]. Please contact [facility] at [phone] with questions or concerns regarding your invoice.   Our billing staff will not be able to assist you with questions regarding bills from these companies.  You will be contacted with the lab results as soon as they are available. The fastest way to get your results is to activate your My Chart account. Instructions are located on the last page of this paperwork. If you have not heard from [place] regarding the results in 2 weeks, please contact this office.  I personally performed the services described in this documentation, which was scribed in my presence. The recorded information has been reviewed and considered for accuracy and completeness, addended by me as needed, and agree with information above.  Signed,   [name], MD [facility].  07/05/17 8:53 AM

## 2017-07-04 NOTE — Patient Instructions (Addendum)
Check your blood sugar fasting and 2 hours after meals. Oh dictated a check once or twice per day at various times and bring a record of those readings in the next 6 weeks. For now increase Lantus to 62 units then additional 2 units every 3 days until your blood sugars remain under 200. Start Marcelline Deist once per day. Depending on blood work, we can discuss mealtime insulin if needed next visit. We can also recheck your hemoglobin A1c at next visit.    IF you received an x-ray today, you will receive an invoice from [facility]. Please contact [facility] at [phone] with questions or concerns regarding your invoice.   IF you received labwork today, you will receive an invoice from [place]. Please contact [facility] at [phone] with questions or concerns regarding your invoice.   Our billing staff will not be able to assist you with questions regarding bills from these companies.  You will be contacted with the lab results as soon as they are available. The fastest way to get your results is to activate your My Chart account. Instructions are located on the last page of this paperwork. If you have not heard from [place] regarding the results in 2 weeks, please contact this office.

## 2017-07-05 ENCOUNTER — Telehealth: Payer: Self-pay | Admitting: Family Medicine

## 2017-07-05 ENCOUNTER — Telehealth: Payer: Self-pay | Admitting: *Deleted

## 2017-07-05 LAB — COMPREHENSIVE METABOLIC PANEL
A/G RATIO: 1.5 (ref 1.2–2.2)
ALBUMIN: 4 g/dL (ref 3.5–5.5)
ALT: 22 IU/L (ref 0–32)
AST: 24 IU/L (ref 0–40)
Alkaline Phosphatase: 60 IU/L (ref 39–117)
BILIRUBIN TOTAL: 0.4 mg/dL (ref 0.0–1.2)
BUN / CREAT RATIO: 22 (ref 9–23)
BUN: 15 mg/dL (ref 6–24)
CHLORIDE: 99 mmol/L (ref 96–106)
CO2: 21 mmol/L (ref 20–29)
Calcium: 9 mg/dL (ref 8.7–10.2)
Creatinine, Ser: 0.68 mg/dL (ref 0.57–1.00)
GFR calc non Af Amer: 107 mL/min/{1.73_m2} (ref >59)
GFR, EST AFRICAN AMERICAN: 124 mL/min/{1.73_m2} (ref >59)
GLOBULIN, TOTAL: 2.7 g/dL (ref 1.5–4.5)
Glucose: 328 mg/dL — ABNORMAL HIGH (ref 65–99)
POTASSIUM: 3.9 mmol/L (ref 3.5–5.2)
SODIUM: 136 mmol/L (ref 134–144)
TOTAL PROTEIN: 6.7 g/dL (ref 6.0–8.5)

## 2017-07-05 LAB — LIPID PANEL
Chol/HDL Ratio: 7.3 ratio — ABNORMAL HIGH (ref 0.0–4.4)
Cholesterol, Total: 190 mg/dL (ref 100–199)
HDL: 26 mg/dL — ABNORMAL LOW (ref >39)
Triglycerides: 531 mg/dL — ABNORMAL HIGH (ref 0–149)

## 2017-07-05 LAB — C-PEPTIDE: C PEPTIDE: 7.9 ng/mL — AB (ref 1.1–4.4)

## 2017-07-05 NOTE — Telephone Encounter (Signed)
Spoke with patient she needs Contractor) meter and 3 sensors, each sensor last for 10 days=30 day supply. She also, stated that Marcelline Deist is too expensive. She wants the other diabetic med that you had talked to her about. [facility] [phone]. Pt asks ASAP, because she has not been able to check blood sugars.

## 2017-07-05 NOTE — Telephone Encounter (Signed)
Pt states that her diabetic supplies presciptions were written incorrectly and that the Farxiga (even with the coupon) was $400 so she will need to switch to the other medication he discussed with her at her OV yesterday. Pt still uses the [facility] pharmacy. Please advise.
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
## 2017-07-06 NOTE — Telephone Encounter (Signed)
Did she try the voucher for Farxiga.  Other option would be Invokana. I can send that if voucher did not work for [employer]   I sent the FreeStyle libre sensor system on the 24th. I do not see an option for a ten-day sensor. Please call pharmacy with a verbal order for the 10 day sensor if they have it, #3 with 3 refills

## 2017-07-07 MED ORDER — CANAGLIFLOZIN 100 MG PO TABS: 100.0000 mg | ORAL_TABLET | Freq: Every day | ORAL | 2 refills | Status: DC

## 2017-07-07 MED FILL — FREESTYLE LIBRE READER DEVI: 30 days supply | Qty: 1 | Fill #0

## 2017-07-07 MED FILL — FREESTYLE LIBRE SENSOR SYST: 30 days supply | Qty: 3 | Fill #0

## 2017-07-07 MED FILL — INVOKANA 100 MG TABLET: 100 | 30 days supply | Qty: 30 | Fill #0

## 2017-07-07 NOTE — Telephone Encounter (Signed)
Will rx Invokana. PVD/amputation risks and warnings were discussed with this last visit.

## 2017-07-07 NOTE — Telephone Encounter (Signed)
Dr. Neva Seat,   The prescription for senor has been called into Pharmacy.     Patient states she would like to have Invokana called in.   Renee Ho is to expensive even with the voucher.

## 2017-07-07 NOTE — Telephone Encounter (Signed)
Patient was advised again of potential side effects that were discussed in office visit about Invokana, voiced understanding.

## 2017-07-13 ENCOUNTER — Telehealth: Payer: Self-pay

## 2017-07-13 NOTE — Telephone Encounter (Signed)
Began process for PA for Farxiga  with [employer].  Ref # is [id number] and should be ready for approval/denial within 2-3 business days.  Should receive a fax with results.

## 2017-07-15 MED FILL — valACYclovir HCL 1 GM TABS: 1 | 7 days supply | Qty: 21 | Fill #0

## 2017-07-18 ENCOUNTER — Encounter: Payer: Self-pay | Admitting: Family Medicine

## 2017-07-18 ENCOUNTER — Ambulatory Visit (INDEPENDENT_AMBULATORY_CARE_PROVIDER_SITE_OTHER): Payer: 59 | Admitting: Family Medicine

## 2017-07-18 VITALS — BP 110/72 | HR 86 | Temp 98.3°F | Resp 16 | Ht 66.53 in | Wt 270.3 lb

## 2017-07-18 DIAGNOSIS — Z794 Long term (current) use of insulin: Secondary | ICD-10-CM | POA: Diagnosis not present

## 2017-07-18 DIAGNOSIS — E1165 Type 2 diabetes mellitus with hyperglycemia: Secondary | ICD-10-CM

## 2017-07-18 DIAGNOSIS — R21 Rash and other nonspecific skin eruption: Secondary | ICD-10-CM | POA: Diagnosis not present

## 2017-07-18 MED ORDER — MUPIROCIN 2 % EX OINT: 1.0000 "application " | TOPICAL_OINTMENT | Freq: Three times a day (TID) | CUTANEOUS | 0 refills | Status: DC

## 2017-07-18 MED FILL — MUPIROCIN 2% OINTMENT: 2 | 7 days supply | Qty: 22 | Fill #0

## 2017-07-18 NOTE — Progress Notes (Signed)
Subjective:  By signing my name below, I, [name], attest that this documentation has been prepared under the direction and in the presence of [name], MD. Electronically Signed: [name], [place]. 07/18/2017 , 11:50 AM .  Patient was seen in Room 11 .   Patient ID: PAISLEY GRAJEDA, female    DOB: Dec 30, 1975, 41 y.o.   MRN: [id number] Chief Complaint  Patient presents with  . Hospitalization Follow-up    shingles    HPI JIN SHOCKLEY is a 41 y.o. female Here for hospital follow up. She was last seen by me on Sept 24th for diabetes.   Patient states she was seen by employee health on Friday (Oct 5th, 3 days ago). She notes initially having rash over left upper forehead, and now rash spread over her face including her right side and to her chin. She describes rash initially hurting and now itches. She was placed on Valtrex 1035m BID. She denies fever or rash into other areas, including genital area. She denies any known exposure to poison ivy. She denies any new soaps, cleansers or make-up. She uses cetaphil soap to wash her face.   DM See previous visits. She's been checking her sugars, running in the low 200s now.   Patient Active Problem List   Diagnosis Date Noted  . Hyperglycemia 12/22/2016  . Injury of foot, left 07/18/2013  . HTN (hypertension) 09/18/2012  . Morbid obesity ([place] 09/18/2012  . S/P hysterectomy 06/20/2012  . H/O colonoscopy with polypectomy 06/20/2012  . Depression 12/30/2011  . Plantar fasciitis 04/22/2011  . HYPERTRIGLYCERIDEMIA 02/21/2008  . DIABETES MELLITUS, TYPE II 02/24/2007  . POLYCYSTIC OVARIAN DISEASE 02/24/2007  . HYPERTENSION 02/24/2007  . Sleep apnea 02/24/2007   Past Medical History:  Diagnosis Date  . Depression   . Diabetes mellitus   . Diabetes mellitus without complication ([place]   . Diverticula of intestine   . High cholesterol   . Hypertension   . OSA on CPAP    CPAP pressure= 15  . Polycystic  ovarian syndrome   . Sleep apnea    on CPAP with pressure setting= 15   Past Surgical History:  Procedure Laterality Date  . ABDOMINAL HYSTERECTOMY    . CYST REMOVAL NECK    . KNEE ARTHROSCOPY  07/11/2012   Procedure: ARTHROSCOPY KNEE;  Surgeon: GMeredith Pel MD;  [place]  Service: Orthopedics;  Laterality: Right;  Right knee arthroscopy with debridement  . KNEE SURGERY    . OVARIAN CYST REMOVAL    . WISDOM TOOTH EXTRACTION  05/11/2017   Allergies  Allergen Reactions  . Morphine And Related Other (See Comments)    Migraines  . Reglan [Metoclopramide] Other (See Comments)    Psychotic episodes  . Toradol [Ketorolac Tromethamine] Rash   Prior to Admission medications   Medication Sig Start Date End Date Taking? Authorizing Provider  atorvastatin (LIPITOR) 10 MG tablet Take 1 tablet (10 mg total) by mouth daily. 07/04/17  Yes GWendie Agreste MD  blood glucose meter kit and supplies KIT Dispense based on patient and insurance preference. Use up to four times daily as directed. (FOR ICD-9 250.00, 250.01). 12/09/16  Yes GWendie Agreste MD  canagliflozin ([facility] 100 MG TABS tablet Take 1 tablet (100 mg total) by mouth daily before breakfast. 07/07/17  Yes GWendie Agreste MD  Continuous Blood Gluc Sensor ([place] MISC Dispense #1 for uncontrolled DM2. 07/04/17  Yes GWendie Agreste MD  DULoxetine (  CYMBALTA) 60 MG capsule TAKE 1 CAPSULE BY MOUTH DAILY. 07/04/17  Yes Wendie Agreste, MD  glucose blood test strip Use as instructed 12/09/16  Yes Wendie Agreste, MD  hydrochlorothiazide (HYDRODIURIL) 25 MG tablet Take 1 tablet (25 mg total) by mouth daily. 07/04/17  Yes Wendie Agreste, MD  insulin glargine (LANTUS) 100 UNIT/ML injection Inject 0.62 mLs (62 Units total) into the skin daily. INJECT 62 UNITS INTO THE SKIN AT BEDTIME. 07/04/17  Yes Wendie Agreste, MD  Insulin Syringe-Needle U-100 (TRUEPLUS INSULIN SYRINGE) 30G X 5/16" 0.5 ML MISC USE  AS DIRECTED EVERY DAY 07/04/17  Yes Wendie Agreste, MD  JANUVIA 100 MG tablet Take 1 tablet (100 mg total) by mouth daily. 07/04/17  Yes Wendie Agreste, MD  lisinopril (PRINIVIL,ZESTRIL) 5 MG tablet Take 1 tablet (5 mg total) by mouth daily. 07/04/17  Yes Wendie Agreste, MD  metFORMIN (GLUCOPHAGE) 1000 MG tablet TAKE 1 TABLET (1,000 MG TOTAL) BY MOUTH 2 (TWO) TIMES DAILY WITH A MEAL. 07/04/17  Yes Wendie Agreste, MD   Social History   Social History  . Marital status: Married    Spouse name: N/A  . Number of children: 1  . Years of education: College   Occupational History  . Not on file.   Social History Main Topics  . Smoking status: Former Smoker    Packs/day: 1.00    Types: Cigarettes    Quit date: 12/18/2016  . Smokeless tobacco: Never Used     Comment: stopped smoking on 02/17/17  . Alcohol use No     Comment: Rarely.  . Drug use: No  . Sexual activity: No   Other Topics Concern  . Not on file   Social History Narrative   ** Merged History Encounter **   Drinks diet Dr. Malachi Bonds about 1 a day.        Review of Systems  Constitutional: Negative for fatigue and unexpected weight change.  Respiratory: Negative for chest tightness and shortness of breath.   Cardiovascular: Negative for chest pain, palpitations and leg swelling.  Gastrointestinal: Negative for abdominal pain and blood in stool.  Genitourinary: Negative for genital sores.  Skin: Positive for rash.  Neurological: Negative for dizziness, syncope, light-headedness and headaches.       Objective:   Physical Exam  Constitutional: She is oriented to person, place, and time. She appears well-developed and well-nourished. No distress.  HENT:  Head: Normocephalic and atraumatic.  Mouth/Throat: No oral lesions.  There are some scattered papules with some excoriation across her face bilaterally with few lesions appearing raw, no vesicles; there are also a few patches across nasal bridge of her nose; few  other excoriated lesions over posterior neck at the base of her scalp; no oral lesions  Eyes: Pupils are equal, round, and reactive to light. EOM are normal.  Neck: Neck supple.  Cardiovascular: Normal rate.   Pulmonary/Chest: Effort normal. No respiratory distress.  Musculoskeletal: Normal range of motion.  Neurological: She is alert and oriented to person, place, and time.  Skin: Skin is warm and dry.  Psychiatric: She has a normal mood and affect. Her behavior is normal.  Nursing note and vitals reviewed.   Vitals:   07/18/17 1109  BP: 110/72  Pulse: 86  Resp: 16  Temp: 98.3 F (36.8 C)  SpO2: 96%  Weight: 270 lb 4.5 oz (122.6 kg)  Height: 5' 6.53" (1.69 m)      Assessment & Plan:  SHERYLE VICE is a 41 y.o. female Rash of face - Plan: mupirocin ointment (BACTROBAN) 2 %  - Based on current medication a rash, likely shingles. Possible folliculitis versus nonspecific rash or contact dermatitis, however no known new dermatologic products  -Bactroban ointment to affected areas 3 times a day for 1 week  -Over-the-counter hydrocortisone if needed for pruritic areas.  -RTC precautions if not improving within a few days or worsening sooner.  Type 2 diabetes mellitus with hyperglycemia, with long-term current use of insulin (HCC)  -Still elevated, but improving home readings. Tolerating new medications. Plan to continue increasing Lantus by 2 units every 2-3 days until fastings remain below 200  Meds ordered this encounter  Medications  . mupirocin ointment (BACTROBAN) 2 %    Sig: Apply 1 application topically 3 (three) times daily. To affected lesions for 1 week.    Dispense:  22 g    Refill:  0   Patient Instructions    Because of the multiple locations of the rash, it is less likely shingles. Contact dermatitis or possible skin infection is possible. Start Bactroban ointment 3 times per day to the affected areas. You can also use over-the-counter hydrocortisone  for areas that itch. If not improving in the next 2-3 days, or further spread/worsening sooner, please return for recheck.  I'm glad to see the blood sugars improving. Continue to increase Lantus by 2 units every 2-3 days until fastings remain below 200.    Return to the clinic or go to the nearest emergency room if any of your symptoms worsen or new symptoms occur.   IF you received an x-ray today, you will receive an invoice from [facility]. Please contact [facility] at [phone] with questions or concerns regarding your invoice.   IF you received labwork today, you will receive an invoice from [place]. Please contact [facility] at [phone] with questions or concerns regarding your invoice.   Our billing staff will not be able to assist you with questions regarding bills from these companies.  You will be contacted with the lab results as soon as they are available. The fastest way to get your results is to activate your My Chart account. Instructions are located on the last page of this paperwork. If you have not heard from [place] regarding the results in 2 weeks, please contact this office.       I personally performed the services described in this documentation, which was scribed in my presence. The recorded information has been reviewed and considered for accuracy and completeness, addended by me as needed, and agree with information above.  Signed,   [name], MD [facility] [place].  07/20/17 11:03 PM

## 2017-07-18 NOTE — Patient Instructions (Addendum)
  Because of the multiple locations of the rash, it is less likely shingles. Contact dermatitis or possible skin infection is possible. Start Bactroban ointment 3 times per day to the affected areas. You can also use over-the-counter hydrocortisone for areas that itch. If not improving in the next 2-3 days, or further spread/worsening sooner, please return for recheck.  I'm glad to see the blood sugars improving. Continue to increase Lantus by 2 units every 2-3 days until fastings remain below 200.    Return to the clinic or go to the nearest emergency room if any of your symptoms worsen or new symptoms occur.   IF you received an x-ray today, you will receive an invoice from [facility]. Please contact [facility] at [phone] with questions or concerns regarding your invoice.   IF you received labwork today, you will receive an invoice from [place]. Please contact [facility] at [phone] with questions or concerns regarding your invoice.   Our billing staff will not be able to assist you with questions regarding bills from these companies.  You will be contacted with the lab results as soon as they are available. The fastest way to get your results is to activate your My Chart account. Instructions are located on the last page of this paperwork. If you have not heard from [place] regarding the results in 2 weeks, please contact this office.

## 2017-07-20 NOTE — Telephone Encounter (Signed)
Having not received a response from [employer] as of yet re: farxiga, called them and they told me that medication was approved from 07/15/2017-07/15/2018.  Tried to notify patient, but patient's number was not in service.

## 2017-07-29 MED FILL — FREESTYLE LIBRE SENSOR SYST: 30 days supply | Qty: 3 | Fill #1

## 2017-07-29 MED FILL — LANTUS 100 UNITS/ML VIAL: 100 | 32 days supply | Qty: 20 | Fill #1

## 2017-08-12 ENCOUNTER — Telehealth: Payer: Self-pay

## 2017-08-12 DIAGNOSIS — E1165 Type 2 diabetes mellitus with hyperglycemia: Secondary | ICD-10-CM

## 2017-08-12 DIAGNOSIS — Z794 Long term (current) use of insulin: Principal | ICD-10-CM

## 2017-08-12 MED ORDER — DAPAGLIFLOZIN PROPANEDIOL 5 MG PO TABS: 5.0000 mg | ORAL_TABLET | Freq: Every day | ORAL | 2 refills | Status: DC

## 2017-08-12 MED FILL — FARXIGA 5 MG TABLET: 5 | 30 days supply | Qty: 30 | Fill #0

## 2017-08-12 NOTE — Telephone Encounter (Signed)
Ordered farxiga

## 2017-08-18 ENCOUNTER — Ambulatory Visit (INDEPENDENT_AMBULATORY_CARE_PROVIDER_SITE_OTHER): Payer: 59 | Admitting: Family Medicine

## 2017-08-18 ENCOUNTER — Encounter: Payer: Self-pay | Admitting: Family Medicine

## 2017-08-18 VITALS — BP 134/82 | HR 76 | Temp 99.1°F | Resp 16 | Ht 66.35 in | Wt 271.0 lb

## 2017-08-18 DIAGNOSIS — F32A Depression, unspecified: Secondary | ICD-10-CM

## 2017-08-18 DIAGNOSIS — Z794 Long term (current) use of insulin: Secondary | ICD-10-CM

## 2017-08-18 DIAGNOSIS — F329 Major depressive disorder, single episode, unspecified: Secondary | ICD-10-CM

## 2017-08-18 DIAGNOSIS — E1165 Type 2 diabetes mellitus with hyperglycemia: Secondary | ICD-10-CM | POA: Diagnosis not present

## 2017-08-18 MED ORDER — DAPAGLIFLOZIN PROPANEDIOL 5 MG PO TABS: 5.0000 mg | ORAL_TABLET | Freq: Every day | ORAL | 2 refills | Status: DC

## 2017-08-18 MED ORDER — INSULIN GLARGINE 100 UNIT/ML ~~LOC~~ SOLN: 100.0000 [IU] | Freq: Every day | SUBCUTANEOUS | 4 refills | Status: DC

## 2017-08-18 MED ORDER — DULOXETINE HCL 30 MG PO CPEP: 30.0000 mg | ORAL_CAPSULE | Freq: Every day | ORAL | 3 refills | Status: DC

## 2017-08-18 MED FILL — DULoxetine HCL 60 MG CPEP: 60 | 90 days supply | Qty: 90 | Fill #0

## 2017-08-18 MED FILL — DULoxetine HCL 30 MG CPEP: 30 | 30 days supply | Qty: 30 | Fill #0

## 2017-08-18 MED FILL — ATORVASTATIN 10 MG TABLET: 10 | 90 days supply | Qty: 90 | Fill #0

## 2017-08-18 NOTE — Patient Instructions (Addendum)
Try increased Cymbalta dose of 60 and 30mg  pills each day for now.  I would also like you to meet with a therapist to discuss anxiety and depression symptoms further. EAP program would be an option, or can call one of the therapists if needed. Make sure you are getting sufficient sleep. Walking or other form of exercise of activity most days per week, and walking during your breaks at work may also be helpful. Let me know how things are going in the next 3-4 weeks.   Karmen BongoAaron Stewart: [phone] [place] PedClaire Huprich:: [phone]  Continue same doses of meds for now.  Can look into changes if needed depending on A1c.   IF you received an x-ray today, you will receive an invoice from [facility]. Please contact [facility] at [phone] with questions or concerns regarding your invoice.   IF you received labwork today, you will receive an invoice from [facility]. Please contact [facility] at [phone] with questions or concerns regarding your invoice.   Our billing staff will not be able to assist you with questions regarding bills from these companies.  You will be contacted with the lab results as soon as they are available. The fastest way to get your results is to activate your My Chart account. Instructions are located on the last page of this paperwork. If you have not heard from us regarding the results in 2 weeks, please contact this office.

## 2017-08-18 NOTE — Progress Notes (Addendum)
Subjective:  By signing my name below, I, Renee Ho, attest that this documentation has been prepared under the direction and in the presence of Renee Ray, MD. Electronically Signed: Moises Ho, [place]. 08/18/2017 , 3:02 PM .  Patient was seen in Room 12 .   Patient ID: Renee Ho, female    DOB: 1976/05/25, 41 y.o.   MRN: [id number] Chief Complaint  Patient presents with  . Diabetes    6 week folow up   HPI Renee Ho is a 41 y.o. female Here for follow up of DM.   DM Her last DM visit was on Sept 24th. At that time, she was on Lantus 60 units, Januvia 181m QD and metformin 10033mBID. Her sugars were uncontrolled with 200s-300s at that time. She had elevated fasting readings so increased her insulin by 2 units every 3 days and added Farxiga 32m832mD. There were some coverage issues so [employer]ilder Glades changed to invokana. Most recently, Far[employer]ilder Glades going to cover better so switched back.   She is currently taking Farxiga 32mg79m, as it is covered now. She is currently up to Lantus 100 units at night, and still taking metformin 1000mg110m. Today, her fasting Ho sugar was 142 (1:00PM today). She usually runs under 200, usually between 170s-180s. She had a few readings in the 200s, but she states she had pasta the night before those readings. She denies symptomatic lows. She denies any new side effects with her medication.   Panic/high anxiety/depression symptoms She states she's been feeling more emotional lately, "like everything is wrong". She reports being in bed and watching TV; suddenly start crying. She hasn't met with a therapist recently, last time was 15 years ago. She denies any changes in the past few weeks. She feels panicky, and high anxiety even in office today. She hasn't missed any doses of her Cymbalta 60mg 46mShe describes like the world is crashing on her. She's been doing well on her Cymbalta 60mg. 27mdenies SI/HI.   She mentions going  to sleep at 10:00PM last night, and didn't wake up until 1:00PM today. She walks around at work and walks her dogs on days she doesn't work (has 2 large pitbulls). She has 30 minute break time for her 12-hour shifts.   Hypertriglyceridemia Thought to be related to her hyperglycemia. She was continued on Lipitor 10mg QD44mPatient Active Problem List   Diagnosis Date Noted  . Hyperglycemia 12/22/2016  . Injury of foot, left 07/18/2013  . HTN (hypertension) 09/18/2012  . Morbid obesity (HCC) 12/[place]2013  . S/P hysterectomy 06/20/2012  . H/O colonoscopy with polypectomy 06/20/2012  . Depression 12/30/2011  . Plantar fasciitis 04/22/2011  . HYPERTRIGLYCERIDEMIA 02/21/2008  . DIABETES MELLITUS, TYPE II 02/24/2007  . POLYCYSTIC OVARIAN DISEASE 02/24/2007  . HYPERTENSION 02/24/2007  . Sleep apnea 02/24/2007   Past Medical History:  Diagnosis Date  . Depression   . Diabetes mellitus   . Diabetes mellitus without complication (HCC)   .[place]   . High cholesterol   . Hypertension   . OSA on CPAP    CPAP pressure= 15  . Polycystic ovarian syndrome   . Sleep apnea    on CPAP with pressure setting= 15   Past Surgical History:  Procedure Laterality Date  . ABDOMINAL HYSTERECTOMY    . CYST REMOVAL NECK    . KNEE SURGERY    . OVARIAN CYST REMOVAL    . WISDOM TOOTH EXTRACTION  05/11/2017   Allergies  Allergen Reactions  . Morphine And Related Other (See Comments)    Migraines  . Reglan [Metoclopramide] Other (See Comments)    Psychotic episodes  . Toradol [Ketorolac Tromethamine] Rash   Prior to Admission medications   Medication Sig Start Date End Date Taking? Authorizing Provider  atorvastatin (LIPITOR) 10 MG tablet Take 1 tablet (10 mg total) by mouth daily. 07/04/17  Yes Wendie Agreste, MD  Ho glucose meter kit and supplies KIT Dispense based on patient and insurance preference. Use up to four times daily as directed. (FOR ICD-9 250.00, 250.01). 12/09/16   Yes Wendie Agreste, MD  canagliflozin [facility]) 100 MG TABS tablet Take 1 tablet (100 mg total) by mouth daily before breakfast. 07/07/17  Yes Wendie Agreste, MD  Continuous Ho Gluc Sensor ([place]) MISC Dispense #1 for uncontrolled DM2. 07/04/17  Yes Wendie Agreste, MD  dapagliflozin propanediol (FARXIGA) 5 MG TABS tablet Take 5 mg by mouth daily. 08/12/17  Yes Wendie Agreste, MD  DULoxetine (CYMBALTA) 60 MG capsule TAKE 1 CAPSULE BY MOUTH DAILY. 07/04/17  Yes Wendie Agreste, MD  glucose Ho test strip Use as instructed 12/09/16  Yes Wendie Agreste, MD  hydrochlorothiazide (HYDRODIURIL) 25 MG tablet Take 1 tablet (25 mg total) by mouth daily. 07/04/17  Yes Wendie Agreste, MD  insulin glargine (LANTUS) 100 UNIT/ML injection Inject 0.62 mLs (62 Units total) into the skin daily. INJECT 62 UNITS INTO THE SKIN AT BEDTIME. 07/04/17  Yes Wendie Agreste, MD  Insulin Syringe-Needle U-100 (TRUEPLUS INSULIN SYRINGE) 30G X 5/16" 0.5 ML MISC USE AS DIRECTED EVERY DAY 07/04/17  Yes Wendie Agreste, MD  JANUVIA 100 MG tablet Take 1 tablet (100 mg total) by mouth daily. 07/04/17  Yes Wendie Agreste, MD  lisinopril (PRINIVIL,ZESTRIL) 5 MG tablet Take 1 tablet (5 mg total) by mouth daily. 07/04/17  Yes Wendie Agreste, MD  metFORMIN (GLUCOPHAGE) 1000 MG tablet TAKE 1 TABLET (1,000 MG TOTAL) BY MOUTH 2 (TWO) TIMES DAILY WITH A MEAL. 07/04/17  Yes Wendie Agreste, MD  mupirocin ointment (BACTROBAN) 2 % Apply 1 application topically 3 (three) times daily. To affected lesions for 1 week. 07/18/17  Yes Wendie Agreste, MD   Social History   Socioeconomic History  . Marital status: Married    Spouse name: Not on file  . Number of children: 1  . Years of education: College  . Highest education level: Not on file  Social Needs  . Financial resource strain: Not on file  . Food insecurity - worry: Not on file  . Food insecurity - inability: Not on file  .  Transportation needs - medical: Not on file  . Transportation needs - non-medical: Not on file  Occupational History  . Not on file  Tobacco Use  . Smoking status: Former Smoker    Packs/day: 1.00    Types: Cigarettes    Last attempt to quit: 12/18/2016    Years since quitting: 0.6  . Smokeless tobacco: Never Used  . Tobacco comment: stopped smoking on 02/17/17  Substance and Sexual Activity  . Alcohol use: No    Alcohol/week: 0.0 oz    Comment: Rarely.  . Drug use: No  . Sexual activity: No  Other Topics Concern  . Not on file  Social History Narrative   ** Merged History Encounter **   Drinks diet Dr. Malachi Bonds about 1 a day.  Review of Systems  Constitutional: Negative for chills, fatigue, fever and unexpected weight change.  Respiratory: Negative for cough.   Gastrointestinal: Negative for abdominal pain, constipation, diarrhea, nausea and vomiting.  Skin: Negative for rash and wound.  Neurological: Negative for dizziness, weakness and headaches.  Psychiatric/Behavioral: Negative for self-injury and suicidal ideas. The patient is nervous/anxious.        Objective:   Physical Exam  Constitutional: She is oriented to person, place, and time. She appears well-developed and well-nourished.  HENT:  Head: Normocephalic and atraumatic.  Eyes: Conjunctivae and EOM are normal. Pupils are equal, round, and reactive to light.  Neck: Carotid bruit is not present.  Cardiovascular: Normal rate, regular rhythm, normal heart sounds and intact distal pulses.  Pulmonary/Chest: Effort normal and breath sounds normal.  Abdominal: Soft. She exhibits no pulsatile midline mass. There is no tenderness.  Neurological: She is alert and oriented to person, place, and time.  Skin: Skin is warm and dry.  Psychiatric: She has a normal mood and affect. Her behavior is normal.  Vitals reviewed.   Vitals:   08/18/17 1412  BP: 134/82  Pulse: 76  Resp: 16  Temp: 99.1 F (37.3 C)  TempSrc:  Oral  SpO2: 96%  Weight: 271 lb (122.9 kg)  Height: 5' 6.35" (1.685 m)      Assessment & Plan:   Renee Ho is a 40 y.o. female Depression, unspecified depression type - Plan: DULoxetine (CYMBALTA) 30 MG capsule  -  Increased emotional lability recently. Unknown cause.  -Increase Cymbalta to 90 mg daily, counseling discussed including through EAP if that may be more cost effective.  -Activity/exercise including during break time at work may be helpful for symptoms.  -My chart message or follow-up with status update next few weeks.  Type 2 diabetes mellitus with hyperglycemia, with long-term current use of insulin (HCC) - Plan: Hemoglobin A1c, dapagliflozin propanediol (FARXIGA) 5 MG TABS tablet, insulin glargine (LANTUS) 100 UNIT/ML injection  -Improving readings with higher dose of Lantus. Continue 100 units per day  -continue [place] and Januvia same doses for now.    Meds ordered this encounter  Medications  . DULoxetine (CYMBALTA) 30 MG capsule    Sig: Take 1 capsule (30 mg total) daily by mouth.    Dispense:  30 capsule    Refill:  3  . dapagliflozin propanediol (FARXIGA) 5 MG TABS tablet    Sig: Take 5 mg daily by mouth.    Dispense:  30 tablet    Refill:  2  . insulin glargine (LANTUS) 100 UNIT/ML injection    Sig: Inject 1 mL (100 Units total) daily into the skin.    Dispense:  30 mL    Refill:  4   Patient Instructions   Try increased Cymbalta dose of 60 and 83m pills each day for now.  I would also like you to meet with a therapist to discuss anxiety and depression symptoms further. EAP program would be an option, or can call one of the therapists if needed. Make sure you are getting sufficient sleep. Walking or other form of exercise of activity most days per week, and walking during your breaks at work may also be helpful. Let me know how things are going in the next 3-4 weeks.   AVivia Budge [phone] [place]:[phone] Continue same doses  of meds for now.  Can look into changes if needed depending on A1c.   IF you received an x-Ho today, you will receive  an Pharmacologist from [facility]. Please contact [facility] at [phone] with questions or concerns regarding your invoice.   IF you received labwork today, you will receive an invoice from [place]. Please contact [facility] at [phone] with questions or concerns regarding your invoice.   Our billing staff will not be able to assist you with questions regarding bills from these companies.  You will be contacted with the lab results as soon as they are available. The fastest way to get your results is to activate your My Chart account. Instructions are located on the last page of this paperwork. If you have not heard from [place] regarding the results in 2 weeks, please contact this office.      I personally performed the services described in this documentation, which was scribed in my presence. The recorded information has been reviewed and considered for accuracy and completeness, addended by me as needed, and agree with information above.  Signed,   Renee Ray, MD [facility].  08/18/17 6:35 PM

## 2017-08-19 LAB — HEMOGLOBIN A1C
Est. average glucose Bld gHb Est-mCnc: 240 mg/dL
Hgb A1c MFr Bld: 10 % — ABNORMAL HIGH (ref 4.8–5.6)

## 2017-08-23 MED FILL — LANTUS 100 UNITS/ML VIAL: 100 | 30 days supply | Qty: 30 | Fill #0

## 2017-08-29 ENCOUNTER — Other Ambulatory Visit: Payer: Self-pay | Admitting: *Deleted

## 2017-08-29 NOTE — Patient Outreach (Addendum)
Sent secure e-mail to patient's [facility] e-mail address and followed up with a phone call to Leann advising her that disease self-management services will be transitioned from the Link To Wellness program to Active Health Management or [facility] for all [facility] medical plan members in 2019. Ensured that she completed the health risk assessment on the [email or web address] website.  Also advised her that a letter will be mailed to the home residence with details of this transition.  Leann voiced appreciation for the call to explain the changes. Will close case to Link To Wellness diabetes program. Bary RichardJanet S. Daxson Reffett RN,CCM,CDE [facility] Care Management Coordinator [facility] and [facility] Office Phone [phone] Office Fax [phone]

## 2017-09-26 MED FILL — DULoxetine HCL 30 MG CPEP: 30 | 30 days supply | Qty: 30 | Fill #1

## 2017-09-26 MED FILL — HYDROCHLOROTHIAZIDE 25 MG T: 25 | 90 days supply | Qty: 90 | Fill #1

## 2017-09-26 MED FILL — LANTUS 100 UNITS/ML VIAL: 100 | 30 days supply | Qty: 30 | Fill #1

## 2017-09-26 MED FILL — JANUVIA 100 MG TABLET: 100 | 90 days supply | Qty: 90 | Fill #1

## 2017-09-26 MED FILL — FARXIGA 5 MG TABLET: 5 | 30 days supply | Qty: 30 | Fill #1

## 2017-09-26 MED FILL — LISINOPRIL 5 MG TABLET: 5 | 90 days supply | Qty: 90 | Fill #1

## 2017-09-26 MED FILL — ULTICARE INS SYR 1 ML 30GX1: 30G X 1/2" | 90 days supply | Qty: 100 | Fill #1

## 2017-10-26 MED FILL — FARXIGA 5 MG TABLET: 5 | 30 days supply | Qty: 30 | Fill #2

## 2017-10-26 MED FILL — metFORMIN HCL 1000 MG TABS: 1000 | 90 days supply | Qty: 180 | Fill #1

## 2017-10-26 MED FILL — DULoxetine HCL 30 MG CPEP: 30 | 30 days supply | Qty: 30 | Fill #2

## 2017-10-26 MED FILL — LANTUS 100 UNITS/ML VIAL: 100 | 30 days supply | Qty: 30 | Fill #2

## 2017-10-31 MED FILL — DULoxetine HCL 60 MG CPEP: 60 | 90 days supply | Qty: 90 | Fill #1

## 2017-10-31 MED FILL — ATORVASTATIN 10 MG TABLET: 10 | 90 days supply | Qty: 90 | Fill #1

## 2017-11-04 DIAGNOSIS — H10413 Chronic giant papillary conjunctivitis, bilateral: Secondary | ICD-10-CM | POA: Diagnosis not present

## 2017-11-04 DIAGNOSIS — E119 Type 2 diabetes mellitus without complications: Secondary | ICD-10-CM | POA: Diagnosis not present

## 2017-11-22 ENCOUNTER — Encounter: Payer: Self-pay | Admitting: Family Medicine

## 2017-11-22 ENCOUNTER — Ambulatory Visit: Payer: 59 | Admitting: Family Medicine

## 2017-11-22 ENCOUNTER — Other Ambulatory Visit: Payer: Self-pay

## 2017-11-22 VITALS — BP 110/60 | HR 85 | Temp 98.3°F | Resp 18 | Ht 66.35 in | Wt 264.4 lb

## 2017-11-22 DIAGNOSIS — I10 Essential (primary) hypertension: Secondary | ICD-10-CM

## 2017-11-22 DIAGNOSIS — Z794 Long term (current) use of insulin: Secondary | ICD-10-CM | POA: Diagnosis not present

## 2017-11-22 DIAGNOSIS — Z1231 Encounter for screening mammogram for malignant neoplasm of breast: Secondary | ICD-10-CM | POA: Diagnosis not present

## 2017-11-22 DIAGNOSIS — E1165 Type 2 diabetes mellitus with hyperglycemia: Secondary | ICD-10-CM

## 2017-11-22 DIAGNOSIS — F329 Major depressive disorder, single episode, unspecified: Secondary | ICD-10-CM | POA: Diagnosis not present

## 2017-11-22 DIAGNOSIS — Z0000 Encounter for general adult medical examination without abnormal findings: Secondary | ICD-10-CM | POA: Diagnosis not present

## 2017-11-22 DIAGNOSIS — Z130 Encounter for screening for diseases of the blood and blood-forming organs and certain disorders involving the immune mechanism: Secondary | ICD-10-CM | POA: Diagnosis not present

## 2017-11-22 DIAGNOSIS — E785 Hyperlipidemia, unspecified: Secondary | ICD-10-CM | POA: Diagnosis not present

## 2017-11-22 DIAGNOSIS — Z1329 Encounter for screening for other suspected endocrine disorder: Secondary | ICD-10-CM | POA: Diagnosis not present

## 2017-11-22 DIAGNOSIS — F32A Depression, unspecified: Secondary | ICD-10-CM

## 2017-11-22 DIAGNOSIS — Z1239 Encounter for other screening for malignant neoplasm of breast: Secondary | ICD-10-CM

## 2017-11-22 LAB — POCT GLYCOSYLATED HEMOGLOBIN (HGB A1C): HEMOGLOBIN A1C: 6.9

## 2017-11-22 MED ORDER — INSULIN GLARGINE 100 UNIT/ML ~~LOC~~ SOLN: 100.0000 [IU] | Freq: Every day | SUBCUTANEOUS | 4 refills | Status: DC

## 2017-11-22 MED ORDER — METFORMIN HCL 1000 MG PO TABS: ORAL_TABLET | ORAL | 1 refills | Status: DC

## 2017-11-22 MED ORDER — LISINOPRIL 5 MG PO TABS: 5.0000 mg | ORAL_TABLET | Freq: Every day | ORAL | 1 refills | Status: DC

## 2017-11-22 MED ORDER — HYDROCHLOROTHIAZIDE 25 MG PO TABS: 25.0000 mg | ORAL_TABLET | Freq: Every day | ORAL | 1 refills | Status: DC

## 2017-11-22 MED ORDER — ATORVASTATIN CALCIUM 10 MG PO TABS: 10.0000 mg | ORAL_TABLET | Freq: Every day | ORAL | 1 refills | Status: DC

## 2017-11-22 MED ORDER — DAPAGLIFLOZIN PROPANEDIOL 5 MG PO TABS: 5.0000 mg | ORAL_TABLET | Freq: Every day | ORAL | 1 refills | Status: DC

## 2017-11-22 MED ORDER — DULOXETINE HCL 60 MG PO CPEP: ORAL_CAPSULE | ORAL | 1 refills | Status: DC

## 2017-11-22 MED ORDER — DULOXETINE HCL 30 MG PO CPEP: 30.0000 mg | ORAL_CAPSULE | Freq: Every day | ORAL | 1 refills | Status: DC

## 2017-11-22 MED FILL — DULoxetine HCL 30 MG CPEP: 30 | 90 days supply | Qty: 90 | Fill #0

## 2017-11-22 MED FILL — FARXIGA 5 MG TABLET: 5 | 90 days supply | Qty: 90 | Fill #0

## 2017-11-22 MED FILL — LANTUS 100 UNITS/ML VIAL: 100 | 30 days supply | Qty: 30 | Fill #3

## 2017-11-22 NOTE — Progress Notes (Signed)
Subjective:  This chart was scribed for Wendie Agreste, MD by Tamsen Roers, at [place] at [facility].  This patient was seen in room 2 and the patient's care was started at 10:32 AM.   Chief Complaint  Patient presents with  . Depression    Depression scale score 0  . Diabetes    Pt states sugars have been really good. Pt states she has put herself on a strict diet and has stated going to the gym.  . Follow-up     Patient ID: Renee Ho, female    DOB: 1976/08/09, 42 y.o.   MRN: [id number]  HPI HPI Comments: Renee Ho is a 42 y.o. female who presents to Primary Care at [facility] for a follow up on diabetes depression and a physical.    Diabetes: A1C was 10.0 in November 2018, she was up to 100 Units of Lantus at that time, Metformin 1000 mg BID and Farxiga 5 mg QD. She had a normal urine micro albumin 12-09-2016. She had an eye exam one year ago, foot exam last march. --- Patient has been compliant with her medications and denies any side effects.  She had been having blood sugar readings under 200 (74 -one time after the gym, 88, 110, 150 recently). Denies any symptomatic lows. Patient goes to the gym 3-4 days per week and has been on a very strict diet.    Hypertension: Patient is compliant with her Lisinopril HCTZ 25 mg. Denies any chest pains, light headedness or dizziness.    Hyperlipidemia: Patient is on Lipitor 10 mg. She is compliant with this medication and denies any side effects.   She has cut out all fried foods and bread from her diet.  Lab Results  Component Value Date   CHOL 190 07/04/2017   HDL 26 (L) 07/04/2017   LDLCALC Comment 07/04/2017   LDLDIRECT 129 (H) 05/22/2013   TRIG 531 (H) 07/04/2017   CHOLHDL 7.3 (H) 07/04/2017    Lab Results  Component Value Date   ALT 22 07/04/2017   AST 24 07/04/2017   ALKPHOS 60 07/04/2017   BILITOT 0.4 07/04/2017    Depression/anxiety: She was continued on Cymbalta 60 mg QD. Some  emotional lability noted in November. Added additional 30 mg of Cymbalta and recommended counseling as well as walking or other exercise and sufficient sleep. ---- Patient has been compliant with her medication and now feels that she is "better than normal".     Cancer screening:  Cervical cancer screening: Patient had a hysterectomy (5+ years ago)  History of polycystic ovarian syndrome. She has not had a pap smear.  Breast cancer screening: mammogram: She has not yet had a mammogram and would like to have one scheduled.    Immunizations:  Immunization History  Administered Date(s) Administered  . Influenza Split 08/30/2012  . Influenza Whole 08/27/2010, 07/12/2011  . Influenza,inj,Quad PF,6+ Mos 07/18/2013, 07/04/2017  . Influenza-Unspecified 07/12/2015, 08/11/2016  . Pneumococcal Polysaccharide-23 07/18/2013  . Tdap 07/18/2013  She is up to date with her tetanus shot.    Vision: Last Optho exam last month with Dr. Katy Fitch.  No exam data present   Depression:  Depression screen [facility] 2/9 11/22/2017 08/18/2017 07/04/2017 05/03/2017 05/02/2017  Decreased Interest 0 1 0 0 0  Down, Depressed, Hopeless 0 1 0 0 0  PHQ - 2 Score 0 2 0 0 0  Altered sleeping 0 3 - - -  Tired, decreased energy 0 3 - - -  Change in appetite 0 0 - - -  Feeling bad or failure about yourself  0 0 - - -  Trouble concentrating 0 1 - - -  Moving slowly or fidgety/restless 0 0 - - -  Suicidal thoughts 0 0 - - -  PHQ-9 Score 0 9 - - -  Difficult doing work/chores Not difficult at all Very difficult - - -  Some recent data might be hidden    Exercise: She has been exercising more frequently (3-4 times per week- going to the gym).  Patient is really enjoying this and says that the gym is her "new best friend!".   Wt Readings from Last 3 Encounters:  11/22/17 264 lb 6.4 oz (119.9 kg)  08/18/17 271 lb (122.9 kg)  07/18/17 270 lb 4.5 oz (122.6 kg)  Body mass index is 42.23 kg/m.   Dentist: Patient is compliant  with seeing her dentist.   Patient Active Problem List   Diagnosis Date Noted  . Hyperglycemia 12/22/2016  . Injury of foot, left 07/18/2013  . HTN (hypertension) 09/18/2012  . Morbid obesity ([place]) 09/18/2012  . S/P hysterectomy 06/20/2012  . H/O colonoscopy with polypectomy 06/20/2012  . Depression 12/30/2011  . Plantar fasciitis 04/22/2011  . HYPERTRIGLYCERIDEMIA 02/21/2008  . DIABETES MELLITUS, TYPE II 02/24/2007  . POLYCYSTIC OVARIAN DISEASE 02/24/2007  . HYPERTENSION 02/24/2007  . Sleep apnea 02/24/2007   Past Medical History:  Diagnosis Date  . Depression   . Diabetes mellitus   . Diabetes mellitus without complication ([place])   . Diverticula of intestine   . High cholesterol   . Hypertension   . OSA on CPAP    CPAP pressure= 15  . Polycystic ovarian syndrome   . Sleep apnea    on CPAP with pressure setting= 15   Past Surgical History:  Procedure Laterality Date  . ABDOMINAL HYSTERECTOMY    . CYST REMOVAL NECK    . KNEE ARTHROSCOPY  07/11/2012   Procedure: ARTHROSCOPY KNEE;  Surgeon: Meredith Pel, MD;  Location: [place];  Service: Orthopedics;  Laterality: Right;  Right knee arthroscopy with debridement  . KNEE SURGERY    . OVARIAN CYST REMOVAL    . WISDOM TOOTH EXTRACTION  05/11/2017   Allergies  Allergen Reactions  . Morphine And Related Other (See Comments)    Migraines  . Reglan [Metoclopramide] Other (See Comments)    Psychotic episodes  . Toradol [Ketorolac Tromethamine] Rash   Prior to Admission medications   Medication Sig Start Date End Date Taking? Authorizing Provider  atorvastatin (LIPITOR) 10 MG tablet Take 1 tablet (10 mg total) by mouth daily. 07/04/17  Yes Wendie Agreste, MD  blood glucose meter kit and supplies KIT Dispense based on patient and insurance preference. Use up to four times daily as directed. (FOR ICD-9 250.00, 250.01). 12/09/16  Yes Wendie Agreste, MD  Continuous Blood Gluc Sensor ([facility]) MISC  Dispense #1 for uncontrolled DM2. 07/04/17  Yes Wendie Agreste, MD  dapagliflozin propanediol (FARXIGA) 5 MG TABS tablet Take 5 mg daily by mouth. 08/18/17  Yes Wendie Agreste, MD  DULoxetine (CYMBALTA) 30 MG capsule Take 1 capsule (30 mg total) daily by mouth. 08/18/17  Yes Wendie Agreste, MD  DULoxetine (CYMBALTA) 60 MG capsule TAKE 1 CAPSULE BY MOUTH DAILY. 07/04/17  Yes Wendie Agreste, MD  glucose blood test strip Use as instructed 12/09/16  Yes Wendie Agreste, MD  hydrochlorothiazide (HYDRODIURIL) 25 MG tablet Take 1 tablet (  25 mg total) by mouth daily. 07/04/17  Yes Wendie Agreste, MD  insulin glargine (LANTUS) 100 UNIT/ML injection Inject 1 mL (100 Units total) daily into the skin. 08/18/17  Yes Wendie Agreste, MD  Insulin Syringe-Needle U-100 (TRUEPLUS INSULIN SYRINGE) 30G X 5/16" 0.5 ML MISC USE AS DIRECTED EVERY DAY 07/04/17  Yes Wendie Agreste, MD  JANUVIA 100 MG tablet Take 1 tablet (100 mg total) by mouth daily. 07/04/17  Yes Wendie Agreste, MD  lisinopril (PRINIVIL,ZESTRIL) 5 MG tablet Take 1 tablet (5 mg total) by mouth daily. 07/04/17  Yes Wendie Agreste, MD  metFORMIN (GLUCOPHAGE) 1000 MG tablet TAKE 1 TABLET (1,000 MG TOTAL) BY MOUTH 2 (TWO) TIMES DAILY WITH A MEAL. 07/04/17  Yes Wendie Agreste, MD  mupirocin ointment (BACTROBAN) 2 % Apply 1 application topically 3 (three) times daily. To affected lesions for 1 week. Patient not taking: Reported on 11/22/2017 07/18/17   Wendie Agreste, MD   Social History   Socioeconomic History  . Marital status: Married    Spouse name: Not on file  . Number of children: 1  . Years of education: College  . Highest education level: Not on file  Social Needs  . Financial resource strain: Not on file  . Food insecurity - worry: Not on file  . Food insecurity - inability: Not on file  . Transportation needs - medical: Not on file  . Transportation needs - non-medical: Not on file  Occupational History  . Not on file    Tobacco Use  . Smoking status: Former Smoker    Packs/day: 1.00    Types: Cigarettes    Last attempt to quit: 12/18/2016    Years since quitting: 0.9  . Smokeless tobacco: Never Used  . Tobacco comment: stopped smoking on 02/17/17  Substance and Sexual Activity  . Alcohol use: No    Alcohol/week: 0.0 oz    Comment: Rarely.  . Drug use: No  . Sexual activity: No  Other Topics Concern  . Not on file  Social History Narrative   ** Merged History Encounter **   Drinks diet Dr. Malachi Bonds about 1 a day.         Review of Systems  Constitutional: Negative for chills and fever.  Eyes: Negative for pain, redness and itching.  Respiratory: Negative for cough, choking and shortness of breath.   Gastrointestinal: Negative for nausea and vomiting.  Musculoskeletal: Negative for neck pain and neck stiffness.  Neurological: Negative for speech difficulty.       Objective:   Physical Exam  Constitutional: She is oriented to person, place, and time. She appears well-developed and well-nourished.  HENT:  Head: Normocephalic and atraumatic.  Right Ear: External ear normal.  Left Ear: External ear normal.  Mouth/Throat: Oropharynx is clear and moist.  Eyes: Conjunctivae are normal. Pupils are equal, round, and reactive to light.  Neck: Normal range of motion. Neck supple. No thyromegaly present.  Cardiovascular: Normal rate, regular rhythm, normal heart sounds and intact distal pulses.  No murmur heard. Pulmonary/Chest: Effort normal and breath sounds normal. No respiratory distress. She has no wheezes.  Abdominal: Soft. Bowel sounds are normal. There is no tenderness.  Musculoskeletal: Normal range of motion. She exhibits no edema or tenderness.  Lymphadenopathy:    She has no cervical adenopathy.  Neurological: She is alert and oriented to person, place, and time.  Skin: Skin is warm and dry. No rash noted.  Psychiatric: She has a normal  mood and affect. Her behavior is normal. Thought  content normal.      Vitals:   11/22/17 1004  BP: 110/60  Pulse: 85  Resp: 18  Temp: 98.3 F (36.8 C)  TempSrc: Oral  SpO2: 99%  Weight: 264 lb 6.4 oz (119.9 kg)  Height: 5' 6.35" (1.685 m)   Results for orders placed or performed in visit on 11/22/17  POCT glycosylated hemoglobin (Hb A1C)  Result Value Ref Range   Hemoglobin A1C 6.9       Assessment & Plan:   Renee Ho is a 42 y.o. female Annual physical exam - Plan: Lipid panel, TSH, CBC with Differential/Platelet, POCT glycosylated hemoglobin (Hb A1C), Comprehensive metabolic panel, CANCELED: Basic metabolic panel, CANCELED: Basic metabolic panel  - -anticipatory guidance as below in AVS, screening labs above. Health maintenance items as above in HPI discussed/recommended as applicable.   Type 2 diabetes mellitus with hyperglycemia, with long-term current use of insulin (HCC) - Plan: metFORMIN (GLUCOPHAGE) 1000 MG tablet, lisinopril (PRINIVIL,ZESTRIL) 5 MG tablet, insulin glargine (LANTUS) 100 UNIT/ML injection, dapagliflozin propanediol (FARXIGA) 5 MG TABS tablet  - great improvement in control with current regimen and diet/exercise changes.  Continue same regimen.   Essential hypertension - Plan: lisinopril (PRINIVIL,ZESTRIL) 5 MG tablet, hydrochlorothiazide (HYDRODIURIL) 25 MG tablet  - stable. No med changes.   Depression, unspecified depression type - Plan: DULoxetine (CYMBALTA) 60 MG capsule, DULoxetine (CYMBALTA) 30 MG capsule  - stable at current dose. Option of decrease back to 69m if continuing to do well.   Hyperlipidemia, unspecified hyperlipidemia type - Plan: atorvastatin (LIPITOR) 10 MG tablet  -  Stable, tolerating current regimen. Medications refilled.   Screening for thyroid disorder Screening, anemia, deficiency, iron  - labs as above  Screening for breast cancer - Plan: MM Digital Screening   Meds ordered this encounter  Medications  . metFORMIN (GLUCOPHAGE) 1000 MG tablet    Sig:  TAKE 1 TABLET (1,000 MG TOTAL) BY MOUTH 2 (TWO) TIMES DAILY WITH A MEAL.    Dispense:  180 tablet    Refill:  1  . lisinopril (PRINIVIL,ZESTRIL) 5 MG tablet    Sig: Take 1 tablet (5 mg total) by mouth daily.    Dispense:  90 tablet    Refill:  1  . hydrochlorothiazide (HYDRODIURIL) 25 MG tablet    Sig: Take 1 tablet (25 mg total) by mouth daily.    Dispense:  90 tablet    Refill:  1  . insulin glargine (LANTUS) 100 UNIT/ML injection    Sig: Inject 1 mL (100 Units total) into the skin daily.    Dispense:  30 mL    Refill:  4  . DULoxetine (CYMBALTA) 60 MG capsule    Sig: TAKE 1 CAPSULE BY MOUTH DAILY.    Dispense:  90 capsule    Refill:  1  . DULoxetine (CYMBALTA) 30 MG capsule    Sig: Take 1 capsule (30 mg total) by mouth daily.    Dispense:  90 capsule    Refill:  1  . dapagliflozin propanediol (FARXIGA) 5 MG TABS tablet    Sig: Take 5 mg by mouth daily.    Dispense:  90 tablet    Refill:  1  . atorvastatin (LIPITOR) 10 MG tablet    Sig: Take 1 tablet (10 mg total) by mouth daily.    Dispense:  90 tablet    Refill:  1   Patient Instructions     Diabetes is MUCH better  controlled. Congratulations and good work with the exercise!  Ok to stop Januvia at this time and recehck levels in 3 months. Watch for any low blood sugars - see info below.   Ok to stay at current dose of Cymbalta for now. If you continue to do well, can try decreasing back to 61m.  I will refer you for a mammogram.   Thanks for coming in today.   Keeping You Healthy  Get These Tests 1. Blood Pressure- Have your blood pressure checked once a year by your health care provider.  Normal blood pressure is 120/80. 2. Weight- Have your body mass index (BMI) calculated to screen for obesity.  BMI is measure of body fat based on height and weight.  You can also calculate your own BMI at wGravelBags.it 3. Cholesterol- Have your cholesterol checked every 5 years starting at age 89252then yearly  starting at age 945 425 Chlamydia, HIV, and other sexually transmitted diseases- Get screened every year until age 89224 then within three months of each new sexual provider. 5. Pap Test - Every 1-5 years; discuss with your health care provider. 6. Mammogram- Every 1-2 years starting at age 944--50 Take these medicines  Calcium with Vitamin D-Your body needs 1200 mg of Calcium each day and [phone] IU of Vitamin D daily.  Your body can only absorb 500 mg of Calcium at a time so Calcium must be taken in 2 or 3 divided doses throughout the day.  Multivitamin with folic acid- Once daily if it is possible for you to become pregnant.  Get these Immunizations  Gardasil-Series of three doses; prevents HPV related illness such as genital warts and cervical cancer.  Menactra-Single dose; prevents meningitis.  Tetanus shot- Every 10 years.  Flu shot-Every year.  Take these steps 1. Do not smoke-Your healthcare provider can help you quit.  For tips on how to quit go to www.smokefree.gov or call 1-800 QUITNOW. 2. Be physically active- Exercise 5 days a week for at least 30 minutes.  If you are not already physically active, start slow and gradually work up to 30 minutes of moderate physical activity.  Examples of moderate activity include walking briskly, dancing, swimming, bicycling, etc. 3. Breast Cancer- A self breast exam every month is important for early detection of breast cancer.  For more information and instruction on self breast exams, ask your healthcare provider or whttps://www.patel.info/ 4. Eat a healthy diet- Eat a variety of healthy foods such as fruits, vegetables, whole grains, low fat milk, low fat cheeses, yogurt, lean meats, poultry and fish, beans, nuts, tofu, etc.  For more information go to www. Thenutritionsource.org 5. Drink alcohol in moderation- Limit alcohol intake to one drink or less per day. Never drink and drive. 6. Depression- Your emotional health  is as important as your physical health.  If you're feeling down or losing interest in things you normally enjoy please talk to your healthcare provider about being screened for depression. 7. Dental visit- Brush and floss your teeth twice daily; visit your dentist twice a year. 8. Eye doctor- Get an eye exam at least every 2 years. 9. Helmet use- Always wear a helmet when riding a bicycle, motorcycle, rollerblading or skateboarding. 17 Safe sex- If you may be exposed to sexually transmitted infections, use a condom. 11. Seat belts- Seat belts can save your live; always wear one. 12. Smoke/Carbon Monoxide detectors- These detectors need to be installed on the appropriate level of your home. Replace batteries at  least once a year. 13. Skin cancer- When out in the sun please cover up and use sunscreen 15 SPF or higher. 14. Violence- If anyone is threatening or hurting you, please tell your healthcare provider.          IF you received an x-ray today, you will receive an invoice from [facility]. Please contact [facility] at [phone] with questions or concerns regarding your invoice.   IF you received labwork today, you will receive an invoice from [place]. Please contact [facility] at [phone] with questions or concerns regarding your invoice.   Our billing staff will not be able to assist you with questions regarding bills from these companies.  You will be contacted with the lab results as soon as they are available. The fastest way to get your results is to activate your My Chart account. Instructions are located on the last page of this paperwork. If you have not heard from [place] regarding the results in 2 weeks, please contact this office.     I personally performed the services described in this documentation, which was scribed in my presence. The recorded information has been reviewed and considered for accuracy and completeness, addended by me as needed, and  agree with information above.  Signed,   [name], MD Primary [facility] at [place].  11/24/17 10:13 PM

## 2017-11-22 NOTE — Patient Instructions (Addendum)
Diabetes is MUCH better controlled. Congratulations and good work with the exercise!  Ok to stop Januvia at this time and recehck levels in 3 months. Watch for any low blood sugars - see info below.   Ok to stay at current dose of Cymbalta for now. If you continue to do well, can try decreasing back to 60mg .  I will refer you for a mammogram.   Thanks for coming in today.   Keeping You Healthy  Get These Tests 1. Blood Pressure- Have your blood pressure checked once a year by your health care provider.  Normal blood pressure is 120/80. 2. Weight- Have your body mass index (BMI) calculated to screen for obesity.  BMI is measure of body fat based on height and weight.  You can also calculate your own BMI at https://www.west-esparza.com/. 3. Cholesterol- Have your cholesterol checked every 5 years starting at age 18 then yearly starting at age 77. 4. Chlamydia, HIV, and other sexually transmitted diseases- Get screened every year until age 42, then within three months of each new sexual provider. 5. Pap Test - Every 1-5 years; discuss with your health care provider. 6. Mammogram- Every 1-2 years starting at age 26--50  Take these medicines  Calcium with Vitamin D-Your body needs 1200 mg of Calcium each day and [phone] IU of Vitamin D daily.  Your body can only absorb 500 mg of Calcium at a time so Calcium must be taken in 2 or 3 divided doses throughout the day.  Multivitamin with folic acid- Once daily if it is possible for you to become pregnant.  Get these Immunizations  Gardasil-Series of three doses; prevents HPV related illness such as genital warts and cervical cancer.  Menactra-Single dose; prevents meningitis.  Tetanus shot- Every 10 years.  Flu shot-Every year.  Take these steps 1. Do not smoke-Your healthcare provider can help you quit.  For tips on how to quit go to www.smokefree.gov or call 1-800 QUITNOW. 2. Be physically active- Exercise 5 days a week for at least 30  minutes.  If you are not already physically active, start slow and gradually work up to 30 minutes of moderate physical activity.  Examples of moderate activity include walking briskly, dancing, swimming, bicycling, etc. 3. Breast Cancer- A self breast exam every month is important for early detection of breast cancer.  For more information and instruction on self breast exams, ask your healthcare provider or [place].es. 4. Eat a healthy diet- Eat a variety of healthy foods such as fruits, vegetables, whole grains, low fat milk, low fat cheeses, yogurt, lean meats, poultry and fish, beans, nuts, tofu, etc.  For more information go to www. Thenutritionsource.org 5. Drink alcohol in moderation- Limit alcohol intake to one drink or less per day. Never drink and drive. 6. Depression- Your emotional health is as important as your physical health.  If you're feeling down or losing interest in things you normally enjoy please talk to your healthcare provider about being screened for depression. 7. Dental visit- Brush and floss your teeth twice daily; visit your dentist twice a year. 8. Eye doctor- Get an eye exam at least every 2 years. 9. Helmet use- Always wear a helmet when riding a bicycle, motorcycle, rollerblading or skateboarding. 10. Safe sex- If you may be exposed to sexually transmitted infections, use a condom. 11. Seat belts- Seat belts can save your live; always wear one. 12. Smoke/Carbon Monoxide detectors- These detectors need to be installed on the appropriate level of your  home. Replace batteries at least once a year. 13. Skin cancer- When out in the sun please cover up and use sunscreen 15 SPF or higher. 14. Violence- If anyone is threatening or hurting you, please tell your healthcare provider.          IF you received an x-ray today, you will receive an invoice from [facility]. Please contact [facility] at [phone] with  questions or concerns regarding your invoice.   IF you received labwork today, you will receive an invoice from [facility]. Please contact [facility] at [phone] with questions or concerns regarding your invoice.   Our billing staff will not be able to assist you with questions regarding bills from these companies.  You will be contacted with the lab results as soon as they are available. The fastest way to get your results is to activate your My Chart account. Instructions are located on the last page of this paperwork. If you have not heard from us regarding the results in 2 weeks, please contact this office.

## 2017-11-22 NOTE — Progress Notes (Signed)
Shay   

## 2017-11-23 LAB — COMPREHENSIVE METABOLIC PANEL
A/G RATIO: 1.6 (ref 1.2–2.2)
ALBUMIN: 4.3 g/dL (ref 3.5–5.5)
ALT: 11 IU/L (ref 0–32)
AST: 16 IU/L (ref 0–40)
Alkaline Phosphatase: 55 IU/L (ref 39–117)
BUN / CREAT RATIO: 21 (ref 9–23)
BUN: 17 mg/dL (ref 6–24)
Bilirubin Total: 0.6 mg/dL (ref 0.0–1.2)
CALCIUM: 9.5 mg/dL (ref 8.7–10.2)
CO2: 24 mmol/L (ref 20–29)
Chloride: 97 mmol/L (ref 96–106)
Creatinine, Ser: 0.81 mg/dL (ref 0.57–1.00)
GFR, EST AFRICAN AMERICAN: 103 mL/min/{1.73_m2} (ref >59)
GFR, EST NON AFRICAN AMERICAN: 89 mL/min/{1.73_m2} (ref >59)
GLOBULIN, TOTAL: 2.7 g/dL (ref 1.5–4.5)
Glucose: 106 mg/dL — ABNORMAL HIGH (ref 65–99)
POTASSIUM: 4.1 mmol/L (ref 3.5–5.2)
SODIUM: 137 mmol/L (ref 134–144)
TOTAL PROTEIN: 7 g/dL (ref 6.0–8.5)

## 2017-11-23 LAB — CBC WITH DIFFERENTIAL/PLATELET
BASOS ABS: 0 10*3/uL (ref 0.0–0.2)
Basos: 0 %
EOS (ABSOLUTE): 0.2 10*3/uL (ref 0.0–0.4)
Eos: 2 %
Hematocrit: 48 % — ABNORMAL HIGH (ref 34.0–46.6)
Hemoglobin: 15.8 g/dL (ref 11.1–15.9)
Immature Grans (Abs): 0 10*3/uL (ref 0.0–0.1)
Immature Granulocytes: 0 %
LYMPHS ABS: 3.1 10*3/uL (ref 0.7–3.1)
Lymphs: 29 %
MCH: 30.8 pg (ref 26.6–33.0)
MCHC: 32.9 g/dL (ref 31.5–35.7)
MCV: 94 fL (ref 79–97)
MONOS ABS: 0.7 10*3/uL (ref 0.1–0.9)
Monocytes: 6 %
NEUTROS ABS: 6.7 10*3/uL (ref 1.4–7.0)
Neutrophils: 63 %
Platelets: 205 10*3/uL (ref 150–379)
RBC: 5.13 x10E6/uL (ref 3.77–5.28)
RDW: 14.3 % (ref 12.3–15.4)
WBC: 10.7 10*3/uL (ref 3.4–10.8)

## 2017-11-23 LAB — TSH: TSH: 1.53 u[IU]/mL (ref 0.450–4.500)

## 2017-11-23 LAB — LIPID PANEL
CHOL/HDL RATIO: 4.4 ratio (ref 0.0–4.4)
Cholesterol, Total: 127 mg/dL (ref 100–199)
HDL: 29 mg/dL — ABNORMAL LOW (ref >39)
LDL Calculated: 66 mg/dL (ref 0–99)
Triglycerides: 162 mg/dL — ABNORMAL HIGH (ref 0–149)
VLDL CHOLESTEROL CAL: 32 mg/dL (ref 5–40)

## 2017-11-24 ENCOUNTER — Encounter: Payer: Self-pay | Admitting: Family Medicine

## 2017-12-02 ENCOUNTER — Other Ambulatory Visit: Payer: Self-pay

## 2017-12-02 ENCOUNTER — Ambulatory Visit: Payer: Self-pay | Admitting: *Deleted

## 2017-12-02 ENCOUNTER — Ambulatory Visit: Payer: 59 | Admitting: Physician Assistant

## 2017-12-02 ENCOUNTER — Ambulatory Visit: Payer: 59 | Admitting: Family Medicine

## 2017-12-02 DIAGNOSIS — Z794 Long term (current) use of insulin: Secondary | ICD-10-CM | POA: Diagnosis not present

## 2017-12-02 DIAGNOSIS — E11649 Type 2 diabetes mellitus with hypoglycemia without coma: Secondary | ICD-10-CM

## 2017-12-02 DIAGNOSIS — R5383 Other fatigue: Secondary | ICD-10-CM

## 2017-12-02 DIAGNOSIS — E162 Hypoglycemia, unspecified: Secondary | ICD-10-CM

## 2017-12-02 LAB — GLUCOSE, POCT (MANUAL RESULT ENTRY): POC GLUCOSE: 100 mg/dL — AB (ref 70–99)

## 2017-12-02 MED FILL — FREESTYLE LITE TEST STRIP: 25 days supply | Qty: 100 | Fill #0

## 2017-12-02 MED FILL — FREESTYLE LANCETS: 25 days supply | Qty: 100 | Fill #0

## 2017-12-02 NOTE — Progress Notes (Addendum)
By signing my name below, I, Mayer Masker, attest that this documentation has been prepared under the direction and in the presence of Weber, Sarah L, PA-C. Electronically Signed: Mayer Masker, Medical Scribe 12/02/2017 at 6:13 PM. Subjective:    Patient ID: Renee Ho, female    DOB: Oct 14, 1975, 42 y.o.   MRN: [id number]  HPI  Chief Complaint  Patient presents with  . Blood sugars    per pt "dropping", "I went to the gym this am at 5:00 before work, this am at 8:30 am, blood sugars was 62,and vomiting, 2 hrs after eating, blood sugars 128, at 4:00 pm this  afternoon 92,felt  warm flush feeling [place] is a 42 y.o. female who presents to [place] at [facility] for follow up for DM with concerns for low blood sugars. Last seen 10 days ago with significant improvement in her A1c from 10 down to 6.9. She was continued on lantis 100 units QD, Farxiga 89m QD, and metformin [id number]. Lowest blood sugars at that visit were 74 and 88 when working out. She stopped jaTongaHer fasting blood sugars are usually under 110.  She went to the gym this morning for 1.5 hours at 5am, then went to work. She ate normally, then her blood sugar dropped, she felt warm and flushed, clammy, checked her sugar and it was 62, and after eating a lot, it went up to 128. Her low symptoms arose again hours later, when her sugar was 70. At 4:00pm it was 92 after drinking coke and eating "tons". Her last insulin was last night at 8pm and she took 100 units of lantis. She had to leave work at [employer] because of her symptoms. She has a HA, dry mouth, and was slurring her words earlier today. Currently, she feels better.   Review of Systems  HENT:       Positive for: dry mouth, slurring words  Neurological: Positive for headaches.       Objective:   Physical Exam  Constitutional: She is oriented to person, place, and time. She appears well-developed and well-nourished.  HENT:  Head: Normocephalic and  atraumatic.  Eyes: Conjunctivae and EOM are normal. Pupils are equal, round, and reactive to light.  Neck: Carotid bruit is not present.  Cardiovascular: Normal rate, regular rhythm, normal heart sounds and intact distal pulses.  Pulmonary/Chest: Effort normal and breath sounds normal.  Abdominal: Soft.  Neurological: She is alert and oriented to person, place, and time.  Skin: Skin is warm and dry.  Psychiatric: She has a normal mood and affect. Her behavior is normal.  Vitals reviewed.  Results for orders placed or performed in visit on 12/02/17  POCT glucose (manual entry)  Result Value Ref Range   POC Glucose 100 (A) 70 - 99 mg/dl       Assessment & Plan:   Renee ALFREDs a [age].o. female Hypoglycemia - Plan: POCT glucose (manual entry)  Fatigue, unspecified type  Type 2 diabetes mellitus with hypoglycemia without coma, with long-term current use of insulin (HCSneads Significantly improved diabetes last visit. Likely hypoglycemia from initial exercise prior to start of her workday, then persistent hypoglycemia from continued Lantus affect. May not need quite as high of a dose of Lantus at this point given recent control.   -Hypoglycemia has improved throughout the day, and reading in office reassuring. We'll continue home monitoring/treatment with ER/911 precautions if worsening. Handout given and self treatment options  discussed. Continue frequent meals and snacks as needed. Hold on exercise for at least 1 day.  -Decrease Lantus by 20% then slowly increase up by 2 units every 2-3 days until fastings below 150. No other change in medications at this time.  -With improved control, may have potential for hypoglycemia. Precautions were discussed, but if only paperwork can be completed for today's issue as well as potential for this to occur again in the future. Ideally we'll adjust regimen to prevent that from happening.   No orders of the defined types were placed in this  encounter.  Patient Instructions   Low blood sugar may be due to exercise this morning. Drop Lantus to 80 units tonight. Hold on exercise tomorrow. Monitor blood sugars tomorrow, and make sure you take in sufficient calories with regular meals. If you are running higher, then increase by 2 units ever 2 days until you remain under 150 fasting. Then we can slowly move up form there. See other info on hypoglycemia below.  Update me on your blood sugar readings in the next 2 weeks.  Return to the clinic or go to the nearest emergency room if any of your symptoms worsen or new symptoms occur.  Hypoglycemia Hypoglycemia occurs when the level of sugar (glucose) in the blood is too low. Glucose is a type of sugar that provides the body's main source of energy. Certain hormones (insulin and glucagon) control the level of glucose in the blood. Insulin lowers blood glucose, and glucagon increases blood glucose. Hypoglycemia can result from having too much insulin in the bloodstream, or from not eating enough food that contains glucose. Hypoglycemia can happen in people who do or do not have diabetes. It can develop quickly, and it can be a medical emergency. What are the causes? Hypoglycemia occurs most often in people who have diabetes. If you have diabetes, hypoglycemia may be caused by:  Diabetes medicine.  Not eating enough, or not eating often enough.  Increased physical activity.  Drinking alcohol, especially when you have not eaten recently.  If you do not have diabetes, hypoglycemia may be caused by:  A tumor in the pancreas. The pancreas is the organ that makes insulin.  Not eating enough, or not eating for long periods at a time (fasting).  Severe infection or illness that affects the liver, heart, or kidneys.  Certain medicines.  You may also have reactive hypoglycemia. This condition causes hypoglycemia within 4 hours of eating a meal. This may occur after having stomach surgery.  Sometimes, the cause of reactive hypoglycemia is not known. What increases the risk? Hypoglycemia is more likely to develop in:  People who have diabetes and take medicines to lower blood glucose.  People who abuse alcohol.  People who have a severe illness.  What are the signs or symptoms? Hypoglycemia may not cause any symptoms. If you have symptoms, they may include:  Hunger.  Anxiety.  Sweating and feeling clammy.  Confusion.  Dizziness or feeling light-headed.  Sleepiness.  Nausea.  Increased heart rate.  Headache.  Blurry vision.  Seizure.  Nightmares.  Tingling or numbness around the mouth, lips, or tongue.  A change in speech.  Decreased ability to concentrate.  A change in coordination.  Restless sleep.  Tremors or shakes.  Fainting.  Irritability.  How is this diagnosed? Hypoglycemia is diagnosed with a blood test to measure your blood glucose level. This blood test is done while you are having symptoms. Your health care provider may also do  a physical exam and review your medical history. If you do not have diabetes, other tests may be done to find the cause of your hypoglycemia. How is this treated? This condition can often be treated by immediately eating or drinking something that contains glucose, such as:  3-4 sugar tablets (glucose pills).  Glucose gel, 15-gram tube.  Fruit juice, 4 oz (120 mL).  Regular soda (not diet soda), 4 oz (120 mL).  Low-fat milk, 4 oz (120 mL).  Several pieces of hard candy.  Sugar or honey, 1 Tbsp.  Treating Hypoglycemia If You Have Diabetes  If you are alert and able to swallow safely, follow the 15:15 rule:  Take 15 grams of a rapid-acting carbohydrate. Rapid-acting options include: ? 1 tube of glucose gel. ? 3 glucose pills. ? 6-8 pieces of hard candy. ? 4 oz (120 mL) of fruit juice. ? 4 oz (120 ml) of regular (not diet) soda.  Check your blood glucose 15 minutes after you take the  carbohydrate.  If the repeat blood glucose level is still at or below 70 mg/dL (3.9 mmol/L), take 15 grams of a carbohydrate again.  If your blood glucose level does not increase above 70 mg/dL (3.9 mmol/L) after 3 tries, seek emergency medical care.  After your blood glucose level returns to normal, eat a meal or a snack within 1 hour.  Treating Severe Hypoglycemia Severe hypoglycemia is when your blood glucose level is at or below 54 mg/dL (3 mmol/L). Severe hypoglycemia is an emergency. Do not wait to see if the symptoms will go away. Get medical help right away. Call your local emergency services (911 in the [place] Do not drive yourself to the hospital. If you have severe hypoglycemia and you cannot eat or drink, you may need an injection of glucagon. A family member or close friend should learn how to check your blood glucose and how to give you a glucagon injection. Ask your health care provider if you need to have an emergency glucagon injection kit available. Severe hypoglycemia may need to be treated in a hospital. The treatment may include getting glucose through an IV tube. You may also need treatment for the cause of your hypoglycemia. Follow these instructions at home: General instructions  Avoid any diets that cause you to not eat enough food. Talk with your health care provider before you start any new diet.  Take over-the-counter and prescription medicines only as told by your health care provider.  Limit alcohol intake to no more than 1 drink per day for nonpregnant women and 2 drinks per day for men. One drink equals 12 oz of beer, 5 oz of wine, or 1 oz of hard liquor.  Keep all follow-up visits as told by your health care provider. This is important. If You Have Diabetes:   Make sure you know the symptoms of hypoglycemia.  Always have a rapid-acting carbohydrate snack with you to treat low blood sugar.  Follow your diabetes management plan, as told by your health care  provider. Make sure you: ? Take your medicines as directed. ? Follow your exercise plan. ? Follow your meal plan. Eat on time, and do not skip meals. ? Check your blood glucose as often as directed. Make sure to check your blood glucose before and after exercise. If you exercise longer or in a different way than usual, check your blood glucose more often. ? Follow your sick day plan whenever you cannot eat or drink normally. Make this plan  in advance with your health care provider.  Share your diabetes management plan with people in your workplace, school, and household.  Check your urine for ketones when you are ill and as told by your health care provider.  Carry a medical alert card or wear medical alert jewelry. If You Have Reactive Hypoglycemia or Low Blood Sugar From Other Causes:  Monitor your blood glucose as told by your health care provider.  Follow instructions from your health care provider about eating or drinking restrictions. Contact a health care provider if:  You have problems keeping your blood glucose in your target range.  You have frequent episodes of hypoglycemia. Get help right away if:  You continue to have hypoglycemia symptoms after eating or drinking something containing glucose.  Your blood glucose is at or below 54 mg/dL (3 mmol/L).  You have a seizure.  You faint. These symptoms may represent a serious problem that is an emergency. Do not wait to see if the symptoms will go away. Get medical help right away. Call your local emergency services (911 in the [place] Do not drive yourself to the hospital. This information is not intended to replace advice given to you by your health care provider. Make sure you discuss any questions you have with your health care provider. Document Released: 09/27/2005 Document Revised: 03/10/2016 Document Reviewed: 10/31/2015 Elsevier Interactive Patient Education  2018 [employer].    IF you received an x-ray today,  you will receive an invoice from [facility]. Please contact [facility] at [phone] with questions or concerns regarding your invoice.   IF you received labwork today, you will receive an invoice from [facility]. Please contact [facility] at [phone] with questions or concerns regarding your invoice.   Our billing staff will not be able to assist you with questions regarding bills from these companies.  You will be contacted with the lab results as soon as they are available. The fastest way to get your results is to activate your My Chart account. Instructions are located on the last page of this paperwork. If you have not heard from [place] regarding the results in 2 weeks, please contact this office.      I personally performed the services described in this documentation, which was scribed in my presence. The recorded information has been reviewed and considered for accuracy and completeness, addended by me as needed, and agree with information above.  Signed,   [name], MD [facility] [place].  12/03/17 3:10 PM   .

## 2017-12-02 NOTE — Telephone Encounter (Signed)
Pt reports BS at 0815= 62; ate peanut butter crackers, 2 eggs, protein bar but then vomited. Pt states weak, sweaty. Agent had made appt for 0940 with Dr. Neva SeatGreene prior to triage. [facility], presently at work; declines ED but will go if symptoms worsen. Will attempt to eat additional carbs, drink juice. Husband will drive to [id number] appt.  Reason for Disposition . [1] Low blood glucose (< 70 mg/dl  or 3.9 mmol/l) persists > 15 minutes AND [2] using low blood sugar Care Advice  Answer Assessment - Initial Assessment Questions 1. SYMPTOMS: "What symptoms are you concerned about?"     Weakness, sweating, BS at 62 at 0815 2. ONSET:  "When did the symptoms start?"     Early AM, 0800 3. BLOOD GLUCOSE: "What is your blood glucose level?"      62 prior to vomiting 4. USUAL RANGE: "What is your blood glucose level usually?" (e.g., usual fasting morning value, usual evening value)     110-115 5. TYPE 1 or 2:  "Do you know what type of diabetes you have?"  (e.g., Type 1, Type 2, Gestational; doesn't know)      Type 2  6. INSULIN: "Do you take insulin?"      100u lantus at HS 7. DIABETES PILLS: "Do you take any pills for your diabetes?"    Glucophage, Farxiga 8. OTHER SYMPTOMS: "Do you have any symptoms?" (e.g., fever, frequent urination, difficulty breathing, vomiting)     Vomiting only, nauseated 9. LOW BLOOD GLUCOSE TREATMENT: "What have you done so far to treat the low blood glucose level?"     Ate carbs 10. ALONE: "Are you alone right now or is someone with you?"        At work 11. PREGNANCY: "Is there any chance you are pregnant?" "When was your last menstrual period?"       no  Protocols used: DIABETES - LOW BLOOD SUGAR-A-AH

## 2017-12-02 NOTE — Patient Instructions (Addendum)
Low blood sugar may be due to exercise this morning. Drop Lantus to 80 units tonight. Hold on exercise tomorrow. Monitor blood sugars tomorrow, and make sure you take in sufficient calories with regular meals. If you are running higher, then increase by 2 units ever 2 days until you remain under 150 fasting. Then we can slowly move up form there. See other info on hypoglycemia below.  Update me on your blood sugar readings in the next 2 weeks.  Return to the clinic or go to the nearest emergency room if any of your symptoms worsen or new symptoms occur.  Hypoglycemia Hypoglycemia occurs when the level of sugar (glucose) in the blood is too low. Glucose is a type of sugar that provides the body's main source of energy. Certain hormones (insulin and glucagon) control the level of glucose in the blood. Insulin lowers blood glucose, and glucagon increases blood glucose. Hypoglycemia can result from having too much insulin in the bloodstream, or from not eating enough food that contains glucose. Hypoglycemia can happen in people who do or do not have diabetes. It can develop quickly, and it can be a medical emergency. What are the causes? Hypoglycemia occurs most often in people who have diabetes. If you have diabetes, hypoglycemia may be caused by:  Diabetes medicine.  Not eating enough, or not eating often enough.  Increased physical activity.  Drinking alcohol, especially when you have not eaten recently.  If you do not have diabetes, hypoglycemia may be caused by:  A tumor in the pancreas. The pancreas is the organ that makes insulin.  Not eating enough, or not eating for long periods at a time (fasting).  Severe infection or illness that affects the liver, heart, or kidneys.  Certain medicines.  You may also have reactive hypoglycemia. This condition causes hypoglycemia within 4 hours of eating a meal. This may occur after having stomach surgery. Sometimes, the cause of reactive  hypoglycemia is not known. What increases the risk? Hypoglycemia is more likely to develop in:  People who have diabetes and take medicines to lower blood glucose.  People who abuse alcohol.  People who have a severe illness.  What are the signs or symptoms? Hypoglycemia may not cause any symptoms. If you have symptoms, they may include:  Hunger.  Anxiety.  Sweating and feeling clammy.  Confusion.  Dizziness or feeling light-headed.  Sleepiness.  Nausea.  Increased heart rate.  Headache.  Blurry vision.  Seizure.  Nightmares.  Tingling or numbness around the mouth, lips, or tongue.  A change in speech.  Decreased ability to concentrate.  A change in coordination.  Restless sleep.  Tremors or shakes.  Fainting.  Irritability.  How is this diagnosed? Hypoglycemia is diagnosed with a blood test to measure your blood glucose level. This blood test is done while you are having symptoms. Your health care provider may also do a physical exam and review your medical history. If you do not have diabetes, other tests may be done to find the cause of your hypoglycemia. How is this treated? This condition can often be treated by immediately eating or drinking something that contains glucose, such as:  3-4 sugar tablets (glucose pills).  Glucose gel, 15-gram tube.  Fruit juice, 4 oz (120 mL).  Regular soda (not diet soda), 4 oz (120 mL).  Low-fat milk, 4 oz (120 mL).  Several pieces of hard candy.  Sugar or honey, 1 Tbsp.  Treating Hypoglycemia If You Have Diabetes  If you are  alert and able to swallow safely, follow the 15:15 rule:  Take 15 grams of a rapid-acting carbohydrate. Rapid-acting options include: ? 1 tube of glucose gel. ? 3 glucose pills. ? 6-8 pieces of hard candy. ? 4 oz (120 mL) of fruit juice. ? 4 oz (120 ml) of regular (not diet) soda.  Check your blood glucose 15 minutes after you take the carbohydrate.  If the repeat blood  glucose level is still at or below 70 mg/dL (3.9 mmol/L), take 15 grams of a carbohydrate again.  If your blood glucose level does not increase above 70 mg/dL (3.9 mmol/L) after 3 tries, seek emergency medical care.  After your blood glucose level returns to normal, eat a meal or a snack within 1 hour.  Treating Severe Hypoglycemia Severe hypoglycemia is when your blood glucose level is at or below 54 mg/dL (3 mmol/L). Severe hypoglycemia is an emergency. Do not wait to see if the symptoms will go away. Get medical help right away. Call your local emergency services (911 in the [place] Do not drive yourself to the hospital. If you have severe hypoglycemia and you cannot eat or drink, you may need an injection of glucagon. A family member or close friend should learn how to check your blood glucose and how to give you a glucagon injection. Ask your health care provider if you need to have an emergency glucagon injection kit available. Severe hypoglycemia may need to be treated in a hospital. The treatment may include getting glucose through an IV tube. You may also need treatment for the cause of your hypoglycemia. Follow these instructions at home: General instructions  Avoid any diets that cause you to not eat enough food. Talk with your health care provider before you start any new diet.  Take over-the-counter and prescription medicines only as told by your health care provider.  Limit alcohol intake to no more than 1 drink per day for nonpregnant women and 2 drinks per day for men. One drink equals 12 oz of beer, 5 oz of wine, or 1 oz of hard liquor.  Keep all follow-up visits as told by your health care provider. This is important. If You Have Diabetes:   Make sure you know the symptoms of hypoglycemia.  Always have a rapid-acting carbohydrate snack with you to treat low blood sugar.  Follow your diabetes management plan, as told by your health care provider. Make sure you: ? Take  your medicines as directed. ? Follow your exercise plan. ? Follow your meal plan. Eat on time, and do not skip meals. ? Check your blood glucose as often as directed. Make sure to check your blood glucose before and after exercise. If you exercise longer or in a different way than usual, check your blood glucose more often. ? Follow your sick day plan whenever you cannot eat or drink normally. Make this plan in advance with your health care provider.  Share your diabetes management plan with people in your workplace, school, and household.  Check your urine for ketones when you are ill and as told by your health care provider.  Carry a medical alert card or wear medical alert jewelry. If You Have Reactive Hypoglycemia or Low Blood Sugar From Other Causes:  Monitor your blood glucose as told by your health care provider.  Follow instructions from your health care provider about eating or drinking restrictions. Contact a health care provider if:  You have problems keeping your blood glucose in your target range.  You have frequent episodes of hypoglycemia. Get help right away if:  You continue to have hypoglycemia symptoms after eating or drinking something containing glucose.  Your blood glucose is at or below 54 mg/dL (3 mmol/L).  You have a seizure.  You faint. These symptoms may represent a serious problem that is an emergency. Do not wait to see if the symptoms will go away. Get medical help right away. Call your local emergency services (911 in the [place] Do not drive yourself to the hospital. This information is not intended to replace advice given to you by your health care provider. Make sure you discuss any questions you have with your health care provider. Document Released: 09/27/2005 Document Revised: 03/10/2016 Document Reviewed: 10/31/2015 Elsevier Interactive Patient Education  2018 [employer].    IF you received an x-ray today, you will receive an invoice from  [facility]. Please contact [facility] at [phone] with questions or concerns regarding your invoice.   IF you received labwork today, you will receive an invoice from [facility]. Please contact [facility] at [phone] with questions or concerns regarding your invoice.   Our billing staff will not be able to assist you with questions regarding bills from these companies.  You will be contacted with the lab results as soon as they are available. The fastest way to get your results is to activate your My Chart account. Instructions are located on the last page of this paperwork. If you have not heard from [place] regarding the results in 2 weeks, please contact this office.

## 2017-12-03 ENCOUNTER — Encounter: Payer: Self-pay | Admitting: Family Medicine

## 2017-12-07 ENCOUNTER — Telehealth: Payer: Self-pay | Admitting: Family Medicine

## 2017-12-07 NOTE — Telephone Encounter (Signed)
Patient needs FMLA forms completed by Dr Neva SeatGreene. I was not sure if it was for her diabetes or something else so I was not sure how to complete the forms. I will place them in Dr Paralee CancelGreene's box on 12/07/17 please return to the FMLA/Disability box at the [employer] checkout desk within 5-7 business days. Thank you!

## 2017-12-11 ENCOUNTER — Encounter: Payer: Self-pay | Admitting: Family Medicine

## 2017-12-12 NOTE — Telephone Encounter (Signed)
Paperwork scanned and faxed on 12/12/17 °

## 2017-12-12 NOTE — Telephone Encounter (Signed)
Paperwork completed, placed in FMLA box to fax.

## 2018-01-05 MED FILL — LANTUS 100 UNITS/ML VIAL: 100 | 30 days supply | Qty: 30 | Fill #4

## 2018-01-05 MED FILL — ULTICARE INS SYR 1 ML 30GX1: 30G X 1/2" | 90 days supply | Qty: 100 | Fill #2

## 2018-01-10 MED FILL — HYDROCHLOROTHIAZIDE 25 MG T: 25 | 90 days supply | Qty: 90 | Fill #0

## 2018-01-10 MED FILL — LISINOPRIL 5 MG TABLET: 5 | 90 days supply | Qty: 90 | Fill #0

## 2018-01-25 MED FILL — metFORMIN HCL 1000 MG TABS: 1000 | 90 days supply | Qty: 180 | Fill #0

## 2018-02-03 MED FILL — LANTUS 100 UNITS/ML VIAL: 100 | 30 days supply | Qty: 30 | Fill #0

## 2018-02-21 MED FILL — ATORVASTATIN 10 MG TABLET: 10 | 90 days supply | Qty: 90 | Fill #0

## 2018-02-21 MED FILL — DULoxetine HCL 30 MG CPEP: 30 | 90 days supply | Qty: 90 | Fill #1

## 2018-02-21 MED FILL — DULoxetine HCL 60 MG CPEP: 60 | 90 days supply | Qty: 90 | Fill #0

## 2018-02-21 MED FILL — FARXIGA 5 MG TABLET: 5 | 90 days supply | Qty: 90 | Fill #1

## 2018-03-07 MED FILL — LANTUS 100 UNITS/ML VIAL: 100 | 30 days supply | Qty: 30 | Fill #1

## 2018-04-03 MED FILL — LISINOPRIL 5 MG TABLET: 5 | 90 days supply | Qty: 90 | Fill #1

## 2018-04-03 MED FILL — LANTUS 100 UNITS/ML VIAL: 100 | 30 days supply | Qty: 30 | Fill #2

## 2018-04-03 MED FILL — ULTICARE INS SYR 1 ML 30GX1: 30G X 1/2" | 90 days supply | Qty: 100 | Fill #3

## 2018-04-03 MED FILL — HYDROCHLOROTHIAZIDE 25 MG T: 25 | 90 days supply | Qty: 90 | Fill #1

## 2018-05-04 MED FILL — ATORVASTATIN 10 MG TABLET: 10 | 90 days supply | Qty: 90 | Fill #1

## 2018-05-04 MED FILL — LANTUS 100 UNITS/ML VIAL: 100 | 30 days supply | Qty: 30 | Fill #3

## 2018-05-04 MED FILL — metFORMIN HCL 1000 MG TABS: 1000 | 90 days supply | Qty: 180 | Fill #1

## 2018-05-05 ENCOUNTER — Telehealth: Payer: 59 | Admitting: Family

## 2018-05-05 DIAGNOSIS — H6690 Otitis media, unspecified, unspecified ear: Secondary | ICD-10-CM | POA: Diagnosis not present

## 2018-05-05 DIAGNOSIS — H66001 Acute suppurative otitis media without spontaneous rupture of ear drum, right ear: Secondary | ICD-10-CM

## 2018-05-05 DIAGNOSIS — Z719 Counseling, unspecified: Secondary | ICD-10-CM

## 2018-05-05 MED ORDER — NEOMYCIN-POLYMYXIN-HC 3.5-10000-1 OT SOLN: 4.0000 [drp] | Freq: Four times a day (QID) | OTIC | 0 refills | Status: DC

## 2018-05-05 MED ORDER — CIPROFLOXACIN-HYDROCORTISONE 0.2-1 % OT SUSP: 3.0000 [drp] | Freq: Two times a day (BID) | OTIC | 0 refills | Status: DC

## 2018-05-05 MED ORDER — AMOXICILLIN-POT CLAVULANATE 875-125 MG PO TABS: 1.0000 | ORAL_TABLET | Freq: Two times a day (BID) | ORAL | 0 refills | Status: DC

## 2018-05-05 MED FILL — NEO/POLYMYXIN/HC EAR SOLN: 3.5-10000-1 | 13 days supply | Qty: 10 | Fill #0

## 2018-05-05 MED FILL — AMOX-CLAV 875-125 MG TABLET: 875-125 | 10 days supply | Qty: 20 | Fill #0

## 2018-05-05 NOTE — Progress Notes (Signed)
Delete, duplicate

## 2018-05-05 NOTE — Progress Notes (Signed)
Thank you for the details you included in the comment boxes. Those details are very helpful in determining the best course of treatment for you and help us to provide the best care. If your pain is 10 out of 10 and this is happening when you pull on it and swollen glands, it may have been an upper respiratory infection turned into an ear infection.  E Visit for Swimmer's Ear  We are sorry that you are not feeling well. Here is how we plan to help!  I have prescribed: Ciprofloxin 0.2% and hydrocortisone 1% otic suspension 3 drops in affected ears twice daily until completed  Augmentin 875mg  one tablet by mouth twice a day for 10 days  In certain cases swimmer's ear may progress to a more serious bacterial infection of the middle or inner ear.  If you have a fever 102 and up and significantly worsening symptoms, this could indicate a more serious infection moving to the middle/inner and needs face to face evaluation in an office by a provider.  Your symptoms should improve over the next 3 days and should resolve in about 7 days.  HOME CARE:   Wash your hands frequently.  Do not place the tip of the bottle on your ear or touch it with your fingers.  You can take Acetominophen 650 mg every 4-6 hours as needed for pain.  If pain is severe or moderate, you can apply a heating pad (set on low) or hot water bottle (wrapped in a towel) to outer ear for 20 minutes.  This will also increase drainage.  Avoid ear plugs  Do not use Q-tips  After showers, help the water run out by tilting your head to one side.  GET HELP RIGHT AWAY IF:   Fever is over 102.2 degrees.  You develop progressive ear pain or hearing loss.  Ear symptoms persist longer than 3 days after treatment.  MAKE SURE YOU:   Understand these instructions.  Will watch your condition.  Will get help right away if you are not doing well or get worse.  TO PREVENT SWIMMER'S EAR:  Use a bathing cap or custom fitted swim  molds to keep your ears dry.  Towel off after swimming to dry your ears.  Tilt your head or pull your earlobes to allow the water to escape your ear canal.  If there is still water in your ears, consider using a hairdryer on the lowest setting.  Thank you for choosing an e-visit. Your e-visit answers were reviewed by a board certified advanced clinical practitioner to complete your personal care plan. Depending upon the condition, your plan could have included both over the counter or prescription medications. Please review your pharmacy choice. Be sure that the pharmacy you have chosen is open so that you can pick up your prescription now.  If there is a problem you may message your provider in MyChart to have the prescription routed to another pharmacy. Your safety is important to us. If you have drug allergies check your prescription carefully.  For the next 24 hours, you can use MyChart to ask questions about today's visit, request a non-urgent call back, or ask for a work or school excuse from your e-visit provider. You will get an email in the next two days asking about your experience. I hope that your e-visit has been valuable and will speed your recovery.

## 2018-05-05 NOTE — Addendum Note (Signed)
Addended by: Beau FannyWITHROW, JOHN C on: 05/05/2018 03:07 PM   Modules accepted: Orders

## 2018-05-05 NOTE — Progress Notes (Signed)
Updated to polymixin/hydro drops as the other drops are not available.  Constance HawJohn Jamiyla Ishee, DNP, FNP-BC [facility] E[facility] Team

## 2018-06-01 MED FILL — FARXIGA 5 MG TABLET: 5 | 30 days supply | Qty: 30 | Fill #0

## 2018-06-01 MED FILL — DULoxetine HCL 60 MG CPEP: 60 | 90 days supply | Qty: 90 | Fill #1

## 2018-06-01 MED FILL — DULoxetine HCL 30 MG CPEP: 30 | 30 days supply | Qty: 30 | Fill #3

## 2018-06-02 ENCOUNTER — Other Ambulatory Visit: Payer: Self-pay | Admitting: Family Medicine

## 2018-06-02 NOTE — Telephone Encounter (Signed)
Freestyle light test strips refill Last Refill:12/02/17 # 100 Last OV: 12/02/17 PCP: Dr Meredith StaggersJeffrey Greene [facility]  Does pt need office visit?

## 2018-06-13 MED FILL — LANTUS 100 UNITS/ML VIAL: 100 | 30 days supply | Qty: 30 | Fill #4

## 2018-06-14 MED FILL — ULTICARE INS SYR 1 ML 30GX1: 30G X 1/2" | 90 days supply | Qty: 100 | Fill #4

## 2018-07-06 ENCOUNTER — Other Ambulatory Visit: Payer: Self-pay | Admitting: Family Medicine

## 2018-07-06 ENCOUNTER — Telehealth: Payer: Self-pay | Admitting: Family Medicine

## 2018-07-06 DIAGNOSIS — I10 Essential (primary) hypertension: Secondary | ICD-10-CM

## 2018-07-06 MED FILL — HYDROCHLOROTHIAZIDE 25 MG T: 25 | 30 days supply | Qty: 30 | Fill #0

## 2018-07-06 MED FILL — FARXIGA 5 MG TABLET: 5 | 30 days supply | Qty: 30 | Fill #1

## 2018-07-06 NOTE — Telephone Encounter (Signed)
Called on cell phone number and left a detailed message for her to call Dr. Paralee Cancel office at [facility] and make an appt for her physical for future refills on her medications.   I have given her a 30 day supply of the hydrochlorothiazide so she can call in and make an appt.

## 2018-07-06 NOTE — Telephone Encounter (Signed)
Pt needs OV for more refills.  30 day supply given 07/06/18

## 2018-07-16 ENCOUNTER — Encounter (HOSPITAL_COMMUNITY): Payer: Self-pay | Admitting: Emergency Medicine

## 2018-07-16 ENCOUNTER — Ambulatory Visit (HOSPITAL_COMMUNITY)
Admission: EM | Admit: 2018-07-16 | Discharge: 2018-07-16 | Disposition: A | Discharge status: Home / Self Care | Payer: 59 | Attending: Family Medicine | Admitting: Family Medicine

## 2018-07-16 DIAGNOSIS — H60391 Other infective otitis externa, right ear: Secondary | ICD-10-CM | POA: Diagnosis not present

## 2018-07-16 DIAGNOSIS — R519 Headache, unspecified: Secondary | ICD-10-CM

## 2018-07-16 DIAGNOSIS — R220 Localized swelling, mass and lump, head: Secondary | ICD-10-CM

## 2018-07-16 DIAGNOSIS — R51 Headache: Secondary | ICD-10-CM

## 2018-07-16 MED ORDER — CEFTRIAXONE SODIUM 250 MG IJ SOLR
INTRAMUSCULAR | Status: AC
  Filled 2018-07-16: qty 250

## 2018-07-16 MED ORDER — CEFDINIR 300 MG PO CAPS: 600.0000 mg | ORAL_CAPSULE | Freq: Every day | ORAL | 0 refills | Status: AC

## 2018-07-16 MED ORDER — LIDOCAINE HCL (PF) 1 % IJ SOLN
INTRAMUSCULAR | Status: AC
  Filled 2018-07-16: qty 2

## 2018-07-16 MED ORDER — TRAMADOL HCL 50 MG PO TABS: 50.0000 mg | ORAL_TABLET | Freq: Four times a day (QID) | ORAL | 0 refills | Status: DC | PRN

## 2018-07-16 MED ORDER — PREDNISONE 10 MG PO TABS: 40.0000 mg | ORAL_TABLET | Freq: Every day | ORAL | 0 refills | Status: AC

## 2018-07-16 MED ORDER — METHYLPREDNISOLONE SODIUM SUCC 125 MG IJ SOLR
125.0000 mg | Freq: Once | INTRAMUSCULAR | Status: AC
  Administered 2018-07-16: 125 mg via INTRAMUSCULAR

## 2018-07-16 MED ORDER — CEFTRIAXONE SODIUM 250 MG IJ SOLR
250.0000 mg | Freq: Once | INTRAMUSCULAR | Status: AC
  Administered 2018-07-16: 250 mg via INTRAMUSCULAR

## 2018-07-16 MED ORDER — METHYLPREDNISOLONE SODIUM SUCC 125 MG IJ SOLR
INTRAMUSCULAR | Status: AC
  Filled 2018-07-16: qty 2

## 2018-07-16 NOTE — ED Provider Notes (Signed)
[facility]    CSN: [id number] Arrival date & time: 07/16/18  1027     History   Chief Complaint Chief Complaint  Patient presents with  . Otalgia    HPI Renee Ho is a 42 y.o. female.   HPI  Patient complains of gradually worsening right ear pain x4 days.  She was recently a cruise and experienced some right ear pressure x4 days ago which is now worsened to severe pain with diminished hearing out of the right ear. Ear is completely occluded and swollen.  Complains of a swollen tender lymph node to her ear.  Reports over a month ago she had an otitis externa related to swimming one month prior and  was treated with antimicrobial drops which resolved infection.  She denies fever, nausea, vomiting, any associated URI symptoms. Past Medical History:  Diagnosis Date  . Depression   . Diabetes mellitus   . Diabetes mellitus without complication ([place])   . Diverticula of intestine   . High cholesterol   . Hypertension   . OSA on CPAP    CPAP pressure= 15  . Polycystic ovarian syndrome   . Sleep apnea    on CPAP with pressure setting= 15    Patient Active Problem List   Diagnosis Date Noted  . Hyperglycemia 12/22/2016  . Injury of foot, left 07/18/2013  . HTN (hypertension) 09/18/2012  . Morbid obesity ([place]) 09/18/2012  . S/P hysterectomy 06/20/2012  . H/O colonoscopy with polypectomy 06/20/2012  . Depression 12/30/2011  . Plantar fasciitis 04/22/2011  . HYPERTRIGLYCERIDEMIA 02/21/2008  . DIABETES MELLITUS, TYPE II 02/24/2007  . POLYCYSTIC OVARIAN DISEASE 02/24/2007  . HYPERTENSION 02/24/2007  . Sleep apnea 02/24/2007    Past Surgical History:  Procedure Laterality Date  . ABDOMINAL HYSTERECTOMY    . CYST REMOVAL NECK    . KNEE ARTHROSCOPY  07/11/2012   Procedure: ARTHROSCOPY KNEE;  Surgeon: Meredith Pel, MD;  Location: [facility];  Service: Orthopedics;  Laterality: Right;  Right knee arthroscopy with debridement  . KNEE SURGERY    .  OVARIAN CYST REMOVAL    . WISDOM TOOTH EXTRACTION  05/11/2017    OB History    Gravida  0   Para  0   Term  0   Preterm  0   AB  0   Living        SAB  0   TAB  0   Ectopic  0   Multiple      Live Births               Home Medications    Prior to Admission medications   Medication Sig Start Date End Date Taking? Authorizing Provider  amoxicillin-clavulanate (AUGMENTIN) 875-125 MG tablet Take 1 tablet by mouth 2 (two) times daily. 05/05/18   Withrow, Elyse Jarvis, FNP  atorvastatin (LIPITOR) 10 MG tablet Take 1 tablet (10 mg total) by mouth daily. 11/22/17   Wendie Agreste, MD  blood glucose meter kit and supplies KIT Dispense based on patient and insurance preference. Use up to four times daily as directed. (FOR ICD-9 250.00, 250.01). 12/09/16   Wendie Agreste, MD  ciprofloxacin-hydrocortisone (CIPRO [facility]) OTIC suspension Place 3 drops into the right ear 2 (two) times daily. Patient not taking: Reported on 07/16/2018 05/05/18   Benjamine Mola, FNP  Continuous Blood Gluc Sensor ([place]) MISC Dispense #1 for uncontrolled DM2. 07/04/17   Wendie Agreste, MD  dapagliflozin propanediol (  FARXIGA) 5 MG TABS tablet Take 5 mg by mouth daily. 11/22/17   Wendie Agreste, MD  DULoxetine (CYMBALTA) 30 MG capsule Take 1 capsule (30 mg total) by mouth daily. 11/22/17   Wendie Agreste, MD  DULoxetine (CYMBALTA) 60 MG capsule TAKE 1 CAPSULE BY MOUTH DAILY. 11/22/17   Wendie Agreste, MD  glucose blood (FREESTYLE LITE) test strip USE AS DIRECTED THREE TO FOUR TIMES DAILY 06/03/18   Wendie Agreste, MD  hydrochlorothiazide (HYDRODIURIL) 25 MG tablet TAKE 1 TABLET (25 MG TOTAL) BY MOUTH DAILY. 07/06/18   Wendie Agreste, MD  insulin glargine (LANTUS) 100 UNIT/ML injection Inject 1 mL (100 Units total) into the skin daily. 11/22/17   Wendie Agreste, MD  Insulin Syringe-Needle U-100 (TRUEPLUS INSULIN SYRINGE) 30G X 5/16" 0.5 ML MISC USE AS DIRECTED EVERY DAY  07/04/17   Wendie Agreste, MD  lisinopril (PRINIVIL,ZESTRIL) 5 MG tablet Take 1 tablet (5 mg total) by mouth daily. 11/22/17   Wendie Agreste, MD  metFORMIN (GLUCOPHAGE) 1000 MG tablet TAKE 1 TABLET (1,000 MG TOTAL) BY MOUTH 2 (TWO) TIMES DAILY WITH A MEAL. 11/22/17   Wendie Agreste, MD  neomycin-polymyxin-hydrocortisone (CORTISPORIN) OTIC solution Place 4 drops into the right ear 4 (four) times daily. 05/05/18   Withrow, Elyse Jarvis, FNP    Family History Family History  Problem Relation Age of Onset  . Cancer Mother   . Mental illness Mother   . Cancer Maternal Grandfather   . Stroke Paternal Grandmother     Social History Social History   Tobacco Use  . Smoking status: Former Smoker    Packs/day: 1.00    Types: Cigarettes    Last attempt to quit: 12/18/2016    Years since quitting: 1.5  . Smokeless tobacco: Never Used  . Tobacco comment: stopped smoking on 02/17/17  Substance Use Topics  . Alcohol use: No    Alcohol/week: 0.0 standard drinks    Comment: Rarely.  . Drug use: No     Allergies   Morphine and related; Reglan [metoclopramide]; and Toradol [ketorolac tromethamine]   Review of Systems Review of Systems   Physical Exam Triage Vital Signs ED Triage Vitals  Enc Vitals Group     BP 07/16/18 1114 (!) 162/80     Pulse Rate 07/16/18 1114 87     Resp 07/16/18 1114 18     Temp 07/16/18 1114 98.5 F (36.9 C)     Temp Source 07/16/18 1114 Oral     SpO2 07/16/18 1114 97 %     Weight --      Height --      Head Circumference --      Peak Flow --      Pain Score 07/16/18 1115 8     Pain Loc --      Pain Edu? --      Excl. in [place]? --    No data found.  Updated Vital Signs BP (!) 162/80 (BP Location: Left Arm)   Pulse 87   Temp 98.5 F (36.9 C) (Oral)   Resp 18   SpO2 97%   Visual Acuity Right Eye Distance:   Left Eye Distance:   Bilateral Distance:    Right Eye Near:   Left Eye Near:    Bilateral Near:     Physical Exam   UC Treatments /  Results  Labs (all labs ordered are listed, but only abnormal results are displayed) Labs Reviewed - No data  to display  EKG None  Radiology No results found.  Procedures Procedures (including critical care time)  Medications Ordered in UC Medications - No data to display  Initial Impression / Assessment and Plan / UC Course  I have reviewed the triage vital signs and the nursing notes.  Pertinent labs & imaging results that were available during my care of the patient were reviewed by me and considered in my medical decision making (see chart for details).     Patient presents today in acute distress from right ear swelling and pressure. She was recently treated successfully for right ear otitis externa after swimming and recently developed similar symptoms should gradually worsen the course the last 4 days while on vacation.  He has remained afebrile and is negative of nausea, vomiting, fever, or chills.  Patient has been given 125 mg of Solu-Medrol here in office for swelling will treat with empiric biotics cefdinir 600 mg once daily x14 days, facial swelling I will treat with prednisone x3 days, and for pain I will prescribe a very quantity of tramadol 50 mg which patient's reports that she can tolerate light of morphine allergy.  She is to follow-up with PCP if symptoms worsen or do not improve within 48 hours she can return here for further evaluation.  Final Clinical Impressions(s) / UC Diagnoses   Final diagnoses:  Infective otitis externa of right ear  Left facial pressure and pain  Left facial swelling   Discharge Instructions   None    ED Prescriptions    Medication Sig Dispense Auth. Provider   traMADol (ULTRAM) 50 MG tablet Take 1 tablet (50 mg total) by mouth every 6 (six) hours as needed for severe pain. 8 tablet Scot Jun, FNP   predniSONE (DELTASONE) 10 MG tablet Take 4 tablets (40 mg total) by mouth daily for 3 days. 12 tablet Scot Jun, FNP    cefdinir (OMNICEF) 300 MG capsule Take 2 capsules (600 mg total) by mouth daily for 14 days. 28 capsule Scot Jun, FNP     Controlled Substance Prescriptions [place] Controlled Substance Registry consulted? Yes, I have consulted the [place] Controlled Substances Registry for this patient, and feel the risk/benefit ratio today is favorable for proceeding with this prescription for a controlled substance.   Scot Jun, FNP 07/16/18 [phone]

## 2018-07-16 NOTE — ED Triage Notes (Signed)
Pt here for right ear pain and swelling

## 2018-08-22 ENCOUNTER — Other Ambulatory Visit: Payer: Self-pay | Admitting: Family Medicine

## 2018-08-22 DIAGNOSIS — I10 Essential (primary) hypertension: Secondary | ICD-10-CM

## 2018-08-22 DIAGNOSIS — E1165 Type 2 diabetes mellitus with hyperglycemia: Secondary | ICD-10-CM

## 2018-08-22 DIAGNOSIS — F329 Major depressive disorder, single episode, unspecified: Secondary | ICD-10-CM

## 2018-08-22 DIAGNOSIS — F32A Depression, unspecified: Secondary | ICD-10-CM

## 2018-08-22 DIAGNOSIS — Z794 Long term (current) use of insulin: Principal | ICD-10-CM

## 2018-08-22 MED FILL — metFORMIN HCL 1000 MG TABS: 1000 | 90 days supply | Qty: 180 | Fill #0

## 2018-08-22 MED FILL — DULoxetine HCL 60 MG CPEP: 60 | 90 days supply | Qty: 90 | Fill #0

## 2018-08-22 MED FILL — LISINOPRIL 5 MG TABLET: 5 | 90 days supply | Qty: 90 | Fill #0

## 2018-08-22 NOTE — Telephone Encounter (Signed)
Requested Prescriptions  Pending Prescriptions Disp Refills  . metFORMIN (GLUCOPHAGE) 1000 MG tablet [Pharmacy Med Name: metFORMIN HCL 1000 MG TABS 1000 TAB] 180 tablet 1    Sig: TAKE 1 TABLET (1,000 MG TOTAL) BY MOUTH 2 (TWO) TIMES DAILY WITH A MEAL.     Endocrinology:  Diabetes - Biguanides Failed - 08/22/2018  9:58 AM      Failed - HBA1C is between 0 and 7.9 and within 180 days    Hemoglobin A1C  Date Value Ref Range Status  11/22/2017 6.9  Final   Hgb A1c MFr Bld  Date Value Ref Range Status  08/18/2017 10.0 (H) 4.8 - 5.6 % Final    Comment:             Prediabetes: 5.7 - 6.4          Diabetes: >6.4          Glycemic control for adults with diabetes: <7.0          Failed - Valid encounter within last 6 months    Recent Outpatient Visits          8 months ago Hypoglycemia   [facility], Ranell Patrick, MD   9 months ago Annual physical exam   [facility], Ranell Patrick, MD   1 year ago Depression, unspecified depression type   [facility], Ranell Patrick, MD   1 year ago Rash of face   [facility], Ranell Patrick, MD   1 year ago Type 2 diabetes mellitus with hyperglycemia, with long-term current use of insulin [facility])   [facility], Ranell Patrick, MD      Future Appointments            Tomorrow Wendie Agreste, MD [facility] [place], [place] in normal range and within 360 days    Creat  Date Value Ref Range Status  05/29/2016 0.82 0.50 - 1.10 mg/dL Final   Creatinine, Ser  Date Value Ref Range Status  11/22/2017 0.81 0.57 - 1.00 mg/dL Final         Passed - eGFR in normal range and within 360 days    GFR, Est African American  Date Value Ref Range Status  05/29/2016 >89 >=60 mL/min Final   GFR calc Af Amer  Date Value Ref Range Status  11/22/2017 103 >59 mL/min/1.73 Final   GFR, Est Non African American  Date Value Ref Range Status  05/29/2016 89 >=60 mL/min  Final   GFR calc non Af Amer  Date Value Ref Range Status  11/22/2017 89 >59 mL/min/1.73 Final       . DULoxetine (CYMBALTA) 60 MG capsule [Pharmacy Med Name: DULoxetine HCL 60 MG CPEP 60 CAP] 90 capsule 1    Sig: TAKE 1 CAPSULE BY MOUTH DAILY.     Psychiatry: Antidepressants - SNRI Failed - 08/22/2018  9:58 AM      Failed - Last BP in normal range    BP Readings from Last 1 Encounters:  07/16/18 (!) 162/80         Failed - Valid encounter within last 6 months    Recent Outpatient Visits          8 months ago Hypoglycemia   [facility], Ranell Patrick, MD   9 months ago Annual physical exam   [facility], Ranell Patrick, MD   1 year ago Depression, unspecified depression type   [facility], Ranell Patrick, MD   1 year ago Rash of face   [facility], Ranell Patrick, MD   1 year ago Type 2 diabetes mellitus with hyperglycemia, with long-term current use of insulin [facility])   [facility], Ranell Patrick, MD      Future Appointments            Tomorrow Wendie Agreste, MD [facility] [place], [place] - Completed PHQ-2 or PHQ-9 in the last 360 days.    [name] lisinopril (PRINIVIL,ZESTRIL) 5 MG tablet [Pharmacy Med Name: LISINOPRIL 5 MG TABLET 5 TAB] 90 tablet 1    Sig: TAKE 1 TABLET (5 MG TOTAL) BY MOUTH DAILY.     Cardiovascular:  ACE Inhibitors Failed - 08/22/2018  9:58 AM      Failed - Cr in normal range and within 180 days    Creat  Date Value Ref Range Status  05/29/2016 0.82 0.50 - 1.10 mg/dL Final   Creatinine, Ser  Date Value Ref Range Status  11/22/2017 0.81 0.57 - 1.00 mg/dL Final         Failed - K in normal range and within 180 days    Potassium  Date Value Ref Range Status  11/22/2017 4.1 3.5 - 5.2 mmol/L Final         Failed - Last BP in normal range    BP Readings from Last 1 Encounters:  07/16/18 (!) 162/80         Failed - Valid encounter within last 6 months     Recent Outpatient Visits          8 months ago Hypoglycemia   [facility] [place], MD   9 months ago Annual physical exam   [facility], Ranell Patrick, MD   1 year ago Depression, unspecified depression type   [facility], Ranell Patrick, MD   1 year ago Rash of face   [facility], Ranell Patrick, MD   1 year ago Type 2 diabetes mellitus with hyperglycemia, with long-term current use of insulin [facility])   [facility], Ranell Patrick, MD      Future Appointments            Tomorrow Wendie Agreste, MD [facility] at [place], [place] - Patient is not pregnant

## 2018-08-23 ENCOUNTER — Other Ambulatory Visit: Payer: Self-pay

## 2018-08-23 ENCOUNTER — Encounter: Payer: Self-pay | Admitting: Family Medicine

## 2018-08-23 ENCOUNTER — Ambulatory Visit: Payer: 59 | Admitting: Family Medicine

## 2018-08-23 VITALS — BP 152/87 | HR 73 | Temp 98.8°F | Resp 14 | Ht 65.0 in | Wt 275.8 lb

## 2018-08-23 DIAGNOSIS — Z9114 Patient's other noncompliance with medication regimen: Secondary | ICD-10-CM

## 2018-08-23 DIAGNOSIS — E11649 Type 2 diabetes mellitus with hypoglycemia without coma: Secondary | ICD-10-CM

## 2018-08-23 DIAGNOSIS — F329 Major depressive disorder, single episode, unspecified: Secondary | ICD-10-CM | POA: Diagnosis not present

## 2018-08-23 DIAGNOSIS — I10 Essential (primary) hypertension: Secondary | ICD-10-CM

## 2018-08-23 DIAGNOSIS — E785 Hyperlipidemia, unspecified: Secondary | ICD-10-CM | POA: Diagnosis not present

## 2018-08-23 DIAGNOSIS — Z794 Long term (current) use of insulin: Secondary | ICD-10-CM

## 2018-08-23 DIAGNOSIS — E1165 Type 2 diabetes mellitus with hyperglycemia: Secondary | ICD-10-CM

## 2018-08-23 DIAGNOSIS — Z23 Encounter for immunization: Secondary | ICD-10-CM

## 2018-08-23 DIAGNOSIS — F32A Depression, unspecified: Secondary | ICD-10-CM

## 2018-08-23 LAB — POCT GLYCOSYLATED HEMOGLOBIN (HGB A1C): Hemoglobin A1C: 9.6 % — AB (ref 4.0–5.6)

## 2018-08-23 LAB — GLUCOSE, POCT (MANUAL RESULT ENTRY): POC GLUCOSE: 248 mg/dL — AB (ref 70–99)

## 2018-08-23 MED ORDER — DULOXETINE HCL 60 MG PO CPEP: 60.0000 mg | ORAL_CAPSULE | Freq: Every day | ORAL | 1 refills | Status: DC

## 2018-08-23 MED ORDER — GLUCOSE BLOOD VI STRP: ORAL_STRIP | 1 refills | Status: DC

## 2018-08-23 MED ORDER — INSULIN GLARGINE 100 UNIT/ML ~~LOC~~ SOLN: 50.0000 [IU] | Freq: Every day | SUBCUTANEOUS | 4 refills | Status: DC

## 2018-08-23 MED ORDER — METFORMIN HCL 1000 MG PO TABS: 1000.0000 mg | ORAL_TABLET | Freq: Two times a day (BID) | ORAL | 1 refills | Status: DC

## 2018-08-23 MED ORDER — LISINOPRIL 5 MG PO TABS: 5.0000 mg | ORAL_TABLET | Freq: Every day | ORAL | 1 refills | Status: DC

## 2018-08-23 MED ORDER — DULOXETINE HCL 30 MG PO CPEP: 30.0000 mg | ORAL_CAPSULE | Freq: Every day | ORAL | 1 refills | Status: DC

## 2018-08-23 MED ORDER — DAPAGLIFLOZIN PROPANEDIOL 5 MG PO TABS: 5.0000 mg | ORAL_TABLET | Freq: Every day | ORAL | 1 refills | Status: DC

## 2018-08-23 MED ORDER — ATORVASTATIN CALCIUM 10 MG PO TABS: 10.0000 mg | ORAL_TABLET | Freq: Every day | ORAL | 1 refills | Status: DC

## 2018-08-23 MED ORDER — HYDROCHLOROTHIAZIDE 25 MG PO TABS: 25.0000 mg | ORAL_TABLET | Freq: Every day | ORAL | 1 refills | Status: DC

## 2018-08-23 MED FILL — ATORVASTATIN 10 MG TABLET: 10 | 90 days supply | Qty: 90 | Fill #0

## 2018-08-23 MED FILL — HYDROCHLOROTHIAZIDE 25 MG T: 25 | 60 days supply | Qty: 60 | Fill #0

## 2018-08-23 MED FILL — LANTUS 100 UNITS/ML VIAL: 100 | 60 days supply | Qty: 30 | Fill #0

## 2018-08-23 MED FILL — FARXIGA 5 MG TABLET: 5 | 90 days supply | Qty: 90 | Fill #0

## 2018-08-23 MED FILL — DULoxetine HCL 30 MG CPEP: 30 | 90 days supply | Qty: 90 | Fill #0

## 2018-08-23 MED FILL — FREESTYLE LITE TEST STRIP: 25 days supply | Qty: 100 | Fill #0

## 2018-08-23 NOTE — Progress Notes (Signed)
Subjective:  By signing my name below, I, [name], attest that this documentation has been prepared under the direction and in the presence of [name], MD. Electronically Signed: [name], [place]. 08/23/2018 , 11:02 AM .  Patient was seen in Room 13 .   Patient ID: Renee Ho, female    DOB: Feb 27, 1976, 42 y.o.   MRN: [id number] Chief Complaint  Patient presents with  . Diabetes   HPI Renee Ho is a 42 y.o. female Here for follow up. Patient was last seen for a physical in early February 2019, and also February 22nd when she was experiencing some hypoglycemia.   Depression - life stressors Patient states she went on a cruise for about 2 weeks. She had an ear infection, and went on steroids and antibiotics for that. She also had depression after gaining custody of a 44 year old, and then lost custody; recently, gained custody back 2 weeks ago. She hasn't been on her medications regularly, and hasn't been going to the gym. She hasn't met with her therapist lately for her depression. She mentions depression was fine until the custody thing, and "it threw her for a loop." She was on Cymbalta 90 mg, off completely for about 3-4 weeks. She denies suicidal thoughts or ideations.   Diabetes When she was seen early February, her A1C had dropped to 6.9 from 10.0. She did have an episode with hypoglycemia with work out. Her Lantus was decreased to 80 units from 100 units. She has not been seen since February. She is also taking Farxiga 5 mg qd, and metformin 1000 mg bid. She also takes Lisinopril 5 mg, HCTZ 25 mg qd, and on a statin, Lipitor.   Lab Results  Component Value Date   HGBA1C 6.9 11/22/2017   She restarted her medications 2 days ago except for [place]. She requests refill of testing strips (freestyle lite). She's been off of Farxiga for about a month now. She was taking Lantus, back up to 100 units, about 3 times a week at night due to lack of  will. She denies checking her blood sugars at home recently. She denies symptomatic lows. She had noticed some diarrhea when restarting metformin, but this has resolved.   Optho: Feb 2019.  Foot: done today.  Pneumovax: 07/18/13.  Urine micro albumin: due; normal in 2018.   Wt Readings from Last 3 Encounters:  08/23/18 275 lb 12.8 oz (125.1 kg)  11/22/17 264 lb 6.4 oz (119.9 kg)  08/18/17 271 lb (122.9 kg)    HTN BP Readings from Last 3 Encounters:  08/23/18 (!) 152/87  07/16/18 (!) 162/80  11/22/17 110/60   Lab Results  Component Value Date   CREATININE 0.81 11/22/2017   Phone message on Sept 26th, 30 day supply of HCTZ with need for appointment discussed. She has been prescribed HCTZ 25 mg qd and lisinopril 5 mg qd.   She's been off of her medications. She restarted her medications 2 days ago, except for HCTZ. She denies chest pain, shortness of breath, lightheadedness, dizziness, or headache.   Hyperlipidemia Lab Results  Component Value Date   CHOL 127 11/22/2017   HDL 29 (L) 11/22/2017   LDLCALC 66 11/22/2017   LDLDIRECT 129 (H) 05/22/2013   TRIG 162 (H) 11/22/2017   CHOLHDL 4.4 11/22/2017   Lab Results  Component Value Date   ALT 11 11/22/2017   AST 16 11/22/2017   ALKPHOS 55 11/22/2017   BILITOT 0.6 11/22/2017   She  takes Lipitor 10 mg qd. She's been off of her medication; she restarted her medication 2 days ago.    Patient Active Problem List   Diagnosis Date Noted  . Hyperglycemia 12/22/2016  . Injury of foot, left 07/18/2013  . HTN (hypertension) 09/18/2012  . Morbid obesity ([place]) 09/18/2012  . S/P hysterectomy 06/20/2012  . H/O colonoscopy with polypectomy 06/20/2012  . Depression 12/30/2011  . Plantar fasciitis 04/22/2011  . HYPERTRIGLYCERIDEMIA 02/21/2008  . DIABETES MELLITUS, TYPE II 02/24/2007  . POLYCYSTIC OVARIAN DISEASE 02/24/2007  . HYPERTENSION 02/24/2007  . Sleep apnea 02/24/2007   Past Medical History:  Diagnosis Date  . Depression    . Diabetes mellitus   . Diabetes mellitus without complication ([place])   . Diverticula of intestine   . High cholesterol   . Hypertension   . OSA on CPAP    CPAP pressure= 15  . Polycystic ovarian syndrome   . Sleep apnea    on CPAP with pressure setting= 15   Past Surgical History:  Procedure Laterality Date  . ABDOMINAL HYSTERECTOMY    . CYST REMOVAL NECK    . KNEE ARTHROSCOPY  07/11/2012   Procedure: ARTHROSCOPY KNEE;  Surgeon: Meredith Pel, MD;  Location: [facility];  Service: Orthopedics;  Laterality: Right;  Right knee arthroscopy with debridement  . KNEE SURGERY    . OVARIAN CYST REMOVAL    . WISDOM TOOTH EXTRACTION  05/11/2017   Allergies  Allergen Reactions  . Morphine And Related Other (See Comments)    Migraines  . Reglan [Metoclopramide] Other (See Comments)    Psychotic episodes  . Toradol [Ketorolac Tromethamine] Rash   Prior to Admission medications   Medication Sig Start Date End Date Taking? Authorizing Provider  amoxicillin-clavulanate (AUGMENTIN) 875-125 MG tablet Take 1 tablet by mouth 2 (two) times daily. 05/05/18   Withrow, Elyse Jarvis, FNP  atorvastatin (LIPITOR) 10 MG tablet Take 1 tablet (10 mg total) by mouth daily. 11/22/17   Wendie Agreste, MD  blood glucose meter kit and supplies KIT Dispense based on patient and insurance preference. Use up to four times daily as directed. (FOR ICD-9 250.00, 250.01). 12/09/16   Wendie Agreste, MD  ciprofloxacin-hydrocortisone (CIPRO [facility]) OTIC suspension Place 3 drops into the right ear 2 (two) times daily. Patient not taking: Reported on 07/16/2018 05/05/18   Benjamine Mola, FNP  Continuous Blood Gluc Sensor ([place]) MISC Dispense #1 for uncontrolled DM2. 07/04/17   Wendie Agreste, MD  dapagliflozin propanediol (FARXIGA) 5 MG TABS tablet Take 5 mg by mouth daily. 11/22/17   Wendie Agreste, MD  DULoxetine (CYMBALTA) 30 MG capsule Take 1 capsule (30 mg total) by mouth daily. 11/22/17   Wendie Agreste, MD  DULoxetine (CYMBALTA) 60 MG capsule TAKE 1 CAPSULE BY MOUTH DAILY. 08/22/18   Wendie Agreste, MD  glucose blood (FREESTYLE LITE) test strip USE AS DIRECTED THREE TO FOUR TIMES DAILY 06/03/18   Wendie Agreste, MD  hydrochlorothiazide (HYDRODIURIL) 25 MG tablet TAKE 1 TABLET (25 MG TOTAL) BY MOUTH DAILY. 07/06/18   Wendie Agreste, MD  insulin glargine (LANTUS) 100 UNIT/ML injection Inject 1 mL (100 Units total) into the skin daily. 11/22/17   Wendie Agreste, MD  Insulin Syringe-Needle U-100 (TRUEPLUS INSULIN SYRINGE) 30G X 5/16" 0.5 ML MISC USE AS DIRECTED EVERY DAY 07/04/17   Wendie Agreste, MD  lisinopril (PRINIVIL,ZESTRIL) 5 MG tablet TAKE 1 TABLET (5 MG TOTAL) BY MOUTH DAILY. 08/22/18  Wendie Agreste, MD  metFORMIN (GLUCOPHAGE) 1000 MG tablet TAKE 1 TABLET (1,000 MG TOTAL) BY MOUTH 2 (TWO) TIMES DAILY WITH A MEAL. 08/22/18   Wendie Agreste, MD  neomycin-polymyxin-hydrocortisone (CORTISPORIN) OTIC solution Place 4 drops into the right ear 4 (four) times daily. 05/05/18   Withrow, Elyse Jarvis, FNP  traMADol (ULTRAM) 50 MG tablet Take 1 tablet (50 mg total) by mouth every 6 (six) hours as needed for severe pain. 07/16/18   Scot Jun, FNP   Social History   Socioeconomic History  . Marital status: Married    Spouse name: Not on file  . Number of children: 1  . Years of education: College  . Highest education level: Not on file  Occupational History  . Not on file  Social Needs  . Financial resource strain: Not on file  . Food insecurity:    Worry: Not on file    Inability: Not on file  . Transportation needs:    Medical: Not on file    Non-medical: Not on file  Tobacco Use  . Smoking status: Former Smoker    Packs/day: 1.00    Types: Cigarettes    Last attempt to quit: 12/18/2016    Years since quitting: 1.6  . Smokeless tobacco: Never Used  . Tobacco comment: stopped smoking on 02/17/17  Substance and Sexual Activity  . Alcohol use: No     Alcohol/week: 0.0 standard drinks    Comment: Rarely.  . Drug use: No  . Sexual activity: Never  Lifestyle  . Physical activity:    Days per week: Not on file    Minutes per session: Not on file  . Stress: Not on file  Relationships  . Social connections:    Talks on phone: Not on file    Gets together: Not on file    Attends religious service: Not on file    Active member of club or organization: Not on file    Attends meetings of clubs or organizations: Not on file    Relationship status: Not on file  . Intimate partner violence:    Fear of current or ex partner: Not on file    Emotionally abused: Not on file    Physically abused: Not on file    Forced sexual activity: Not on file  Other Topics Concern  . Not on file  Social History Narrative   ** Merged History Encounter **   Drinks diet Dr. Malachi Bonds about 1 a day.        Review of Systems  Constitutional: Negative for fatigue and unexpected weight change.  Respiratory: Negative for chest tightness and shortness of breath.   Cardiovascular: Negative for chest pain, palpitations and leg swelling.  Gastrointestinal: Negative for abdominal pain and blood in stool.  Neurological: Negative for dizziness, syncope, light-headedness and headaches.  Psychiatric/Behavioral: Positive for dysphoric mood. Negative for self-injury and suicidal ideas.       Objective:   Physical Exam  Constitutional: She is oriented to person, place, and time. She appears well-developed and well-nourished.  HENT:  Head: Normocephalic and atraumatic.  Eyes: Pupils are equal, round, and reactive to light. Conjunctivae and EOM are normal.  Neck: Carotid bruit is not present.  Cardiovascular: Normal rate, regular rhythm, normal heart sounds and intact distal pulses.  Pulmonary/Chest: Effort normal and breath sounds normal.  Abdominal: Soft. She exhibits no pulsatile midline mass. There is no tenderness.  Neurological: She is alert and oriented to  person, place, and  time.  Skin: Skin is warm and dry.  Psychiatric: She has a normal mood and affect. Her behavior is normal.  Vitals reviewed.   Vitals:   08/23/18 1022 08/23/18 1032  BP: (!) 155/83 (!) 152/87  Pulse: 73 73  Resp: 14   Temp: 98.8 F (37.1 C)   TempSrc: Oral   SpO2: 97%   Weight: 275 lb 12.8 oz (125.1 kg)   Height: '5\' 5"'  (1.651 m)    Diabetic Foot Exam - Simple   Simple Foot Form Diabetic Foot exam was performed with the following findings:  Yes   Visual Inspection No deformities, no ulcerations, no other skin breakdown bilaterally:  Yes Sensation Testing Intact to touch and monofilament testing bilaterally:  Yes Pulse Check Posterior Tibialis and Dorsalis pulse intact bilaterally:  Yes Comments    Results for orders placed or performed in visit on 08/23/18  POCT glycosylated hemoglobin (Hb A1C)  Result Value Ref Range   Hemoglobin A1C 9.6 (A) 4.0 - 5.6 %   HbA1c POC (<> result, manual entry)     HbA1c, POC (prediabetic range)     HbA1c, POC (controlled diabetic range)    POCT glucose (manual entry)  Result Value Ref Range   POC Glucose 248 (A) 70 - 99 mg/dl       Assessment & Plan:    MELENY TREGONING is a 42 y.o. female Type 2 diabetes mellitus with hypoglycemia without coma, with long-term current use of insulin ([place]) - Plan: HM Diabetes Foot Exam, POCT glycosylated hemoglobin (Hb A1C), Microalbumin/Creatinine Ratio, Urine, POCT glucose (manual entry), CANCELED: POCT glycosylated hemoglobin (Hb A1C) Type 2 diabetes mellitus with hyperglycemia, with long-term current use of insulin ([facility]) - Plan: dapagliflozin propanediol (FARXIGA) 5 MG TABS tablet, lisinopril (PRINIVIL,ZESTRIL) 5 MG tablet, metFORMIN (GLUCOPHAGE) 1000 MG tablet, insulin glargine (LANTUS) 100 UNIT/ML injection  Nonadherence to medication -Uncontrolled with medication nonadherence.  Had been doing well previously, but unfortunately with situational stressors affected her  medication usage as well.  -Will restart Lantus insulin but at lower dose initially as she does have a history of hypoglycemia.  Anticipate increased dosing but will recheck in 3 to 4 weeks  Need for influenza vaccination -  CANCELED: Flu Vaccine QUAD 36+ mos IM  Essential hypertension - Plan: Comprehensive metabolic panel, hydrochlorothiazide (HYDRODIURIL) 25 MG tablet, lisinopril (PRINIVIL,ZESTRIL) 5 MG tablet  -Restart meds and assess blood pressure on meds at follow-up visit  Hyperlipidemia, unspecified hyperlipidemia type - Plan: Comprehensive metabolic panel, Lipid panel, atorvastatin (LIPITOR) 10 MG tablet  -Baseline levels obtained, but likely elevated off Lipitor.  Continue Lipitor for now with repeat testing in 6 weeks.  Depression, unspecified depression type - Plan: DULoxetine (CYMBALTA) 60 MG capsule, DULoxetine (CYMBALTA) 30 MG capsule  -Situational stressors impacting health as above.  Recommended meeting with therapist, continue Cymbalta for now.   Meds ordered this encounter  Medications  . DULoxetine (CYMBALTA) 60 MG capsule    Sig: Take 1 capsule (60 mg total) by mouth daily.    Dispense:  90 capsule    Refill:  1  . DULoxetine (CYMBALTA) 30 MG capsule    Sig: Take 1 capsule (30 mg total) by mouth daily.    Dispense:  90 capsule    Refill:  1  . atorvastatin (LIPITOR) 10 MG tablet    Sig: Take 1 tablet (10 mg total) by mouth daily.    Dispense:  90 tablet    Refill:  1  . glucose blood (FREESTYLE LITE)  test strip    Sig: USE AS DIRECTED THREE TO FOUR TIMES DAILY    Dispense:  100 each    Refill:  1  . dapagliflozin propanediol (FARXIGA) 5 MG TABS tablet    Sig: Take 5 mg by mouth daily.    Dispense:  90 tablet    Refill:  1  . hydrochlorothiazide (HYDRODIURIL) 25 MG tablet    Sig: Take 1 tablet (25 mg total) by mouth daily.    Dispense:  30 tablet    Refill:  1  . lisinopril (PRINIVIL,ZESTRIL) 5 MG tablet    Sig: Take 1 tablet (5 mg total) by mouth daily.      Dispense:  90 tablet    Refill:  1  . metFORMIN (GLUCOPHAGE) 1000 MG tablet    Sig: Take 1 tablet (1,000 mg total) by mouth 2 (two) times daily with a meal.    Dispense:  180 tablet    Refill:  1  . insulin glargine (LANTUS) 100 UNIT/ML injection    Sig: Inject 0.5 mLs (50 Units total) into the skin daily.    Dispense:  30 mL    Refill:  4   Patient Instructions   I'm sorry to hear about your recent illness.  Meeting with therapist may help work through depression and stressors. Ok to continue Cymbalta same dose for now - 97m per day and add back the 360mper day in next week or two.  Restart HCTZ, continue lisinopril, and recheck in 3-4 weeks to make sure BP is better controlled.  I did check bloodwork today, but may need to be repeated once you have been on meds for 6 weeks.  Restart insulin, but at 50 units/day for right now.  We will likely need to use higher dose, but watch for any low blood sugar symptoms even at that lower dose.  If those occur, be seen here or emergency room right away.  Recheck in 3 to 4 weeks.  Thank you for coming in today.    Type 2 Diabetes Mellitus, Self Care, Adult When you have type 2 diabetes (type 2 diabetes mellitus), you must keep your blood sugar (glucose) under control. You can do this with:  Nutrition.  Exercise.  Lifestyle changes.  Medicines or insulin, if needed.  Support from your doctors and others.  How do I manage my blood sugar?  Check your blood sugar level every day, as often as told.  Call your doctor if your blood sugar is above your goal numbers for 2 tests in a row.  Have your A1c (hemoglobin A1c) level checked at least two times a year. Have it checked more often if your doctor tells you to. Your doctor will set treatment goals for you. Generally, you should have these blood sugar levels:  Before meals (preprandial): 80-130 mg/dL (4.4-7.2 mmol/L).  After meals (postprandial): lower than 180 mg/dL (10  mmol/L).  A1c level: less than 7%.   What do I need to know about high blood sugar? High blood sugar is called hyperglycemia. Know the signs of high blood sugar. Signs may include:  Feeling: ? Thirsty. ? Hungry. ? Very tired.  Needing to pee (urinate) more than usual.  Blurry vision.  What do I need to know about low blood sugar? Low blood sugar is called hypoglycemia. This is when blood sugar is at or below 70 mg/dL (3.9 mmol/L). Symptoms may include:  Feeling: ? Hungry. ? Worried or nervous (anxious). ? Sweaty and clammy. ?  Confused. ? Dizzy. ? Sleepy. ? Sick to your stomach (nauseous).  Having: ? A fast heartbeat (palpitations). ? A headache. ? A change in your vision. ? Jerky movements that you cannot control (seizure). ? Nightmares. ? Tingling or no feeling (numbness) around the mouth, lips, or tongue.  Having trouble with: ? Talking. ? Paying attention (concentrating). ? Moving (coordination). ? Sleeping.  Shaking.  Passing out (fainting).  Getting upset easily (irritability).  Treating low blood sugar  To treat low blood sugar, eat or drink something sugary right away. If you can think clearly and swallow safely, follow the 15:15 rule:  Take 15 grams of a fast-acting carb (carbohydrate). Some fast-acting carbs are: ? 1 tube of glucose gel. ? 3 sugar tablets (glucose pills). ? 6-8 pieces of hard candy. ? 4 oz (120 mL) of fruit juice. ? 4 oz (120 mL) regular (not diet) soda.  Check your blood sugar 15 minutes after you take the carb.  If your blood sugar is still at or below 70 mg/dL (3.9 mmol/L), take 15 grams of a carb again.  If your blood sugar does not go above 70 mg/dL (3.9 mmol/L) after 3 tries, get help right away.  After your blood sugar goes back to normal, eat a meal or a snack within 1 hour.  Treating very low blood sugar If your blood sugar is at or below 54 mg/dL (3 mmol/L), you have very low blood sugar (severe hypoglycemia).  This is an emergency. Do not wait to see if the symptoms will go away. Get medical help right away. Call your local emergency services (911 in the [place] Do not drive yourself to the hospital. If you have very low blood sugar and you cannot eat or drink, you may need a glucagon shot (injection). A family member or friend should learn how to check your blood sugar and how to give you a glucagon shot. Ask your doctor if you need to have a glucagon shot kit at home. What else is important to manage my diabetes? Medicine Follow these instructions about insulin and diabetes medicines:  Take them as told by your doctor.  Adjust them as told by your doctor.  Do not run out of them.  Having diabetes can raise your risk for other long-term conditions. These include heart or kidney disease. Your doctor may prescribe medicines to help prevent problems from diabetes. Food   Make healthy food choices. These include: ? Chicken, fish, egg whites, and beans. ? Oats, whole wheat, bulgur, brown rice, quinoa, and millet. ? Fresh fruits and vegetables. ? Low-fat dairy products. ? Nuts, avocado, olive oil, and canola oil.  Make a food plan with a specialist (dietitian).  Follow instructions from your doctor about what you cannot eat or drink.  Drink enough fluid to keep your pee (urine) clear or pale yellow.  Eat healthy snacks between healthy meals.  Keep track of carbs that you eat. Read food labels. Learn food serving sizes.  Follow your sick day plan when you cannot eat or drink normally. Make this plan with your doctor so it is ready to use. Activity  Exercise at least 3 times a week.  Do not go more than 2 days without exercising.  Talk with your doctor before you start a new exercise. Your doctor may need to adjust your insulin, medicines, or food. Lifestyle   Do not use any tobacco products. These include cigarettes, chewing tobacco, and e-cigarettes.If you need help quitting, ask your  doctor.  Ask your doctor how much alcohol is safe for you.  Learn to deal with stress. If you need help with this, ask your doctor. Body care  Stay up to date with your shots (immunizations).  Have your eyes and feet checked by a doctor as often as told.  Check your skin and feet every day. Check for cuts, bruises, redness, blisters, or sores.  Brush your teeth and gums two times a day.  Floss at least one time a day.  Go to the dentist least one time every 6 months.  Stay at a healthy weight. General instructions   Take over-the-counter and prescription medicines only as told by your doctor.  Share your diabetes care plan with: ? Your work or school. ? People you live with.  Check your pee (urine) for ketones: ? When you are sick. ? As told by your doctor.  Carry a card or wear jewelry that says that you have diabetes.  Ask your doctor: ? Do I need to meet with a diabetes educator? ? Where can I find a support group for people with diabetes?  Keep all follow-up visits as told by your doctor. This is important. Where to find more information: To learn more about diabetes, visit:  American Diabetes Association: www.diabetes.org  American Association of Diabetes Educators: www.diabeteseducator.org/patient-resources  This information is not intended to replace advice given to you by your health care provider. Make sure you discuss any questions you have with your health care provider. Document Released: 01/19/2016 Document Revised: 03/04/2016 Document Reviewed: 10/31/2015 Elsevier Interactive Patient Education  [employer].   If you have lab work done today you will be contacted with your lab results within the next 2 weeks.  If you have not heard from [place] then please contact us. The fastest way to get your results is to register for My Chart.    IF you received an x-ray today, you will receive an invoice from [facility]. Please contact [facility]  Radiology at [phone] with questions or concerns regarding your invoice.   IF you received labwork today, you will receive an invoice from [facility]. Please contact [facility] at [phone] with questions or concerns regarding your invoice.   Our billing staff will not be able to assist you with questions regarding bills from these companies.  You will be contacted with the lab results as soon as they are available. The fastest way to get your results is to activate your My Chart account. Instructions are located on the last page of this paperwork. If you have not heard from [place] regarding the results in 2 weeks, please contact this office.       I personally performed the services described in this documentation, which was scribed in my presence. The recorded information has been reviewed and considered for accuracy and completeness, addended by me as needed, and agree with information above.  Signed,   [name], MD [facility].  08/28/18 11:38 AM

## 2018-08-23 NOTE — Patient Instructions (Addendum)
I'm sorry to hear about your recent illness.  Meeting with therapist may help work through depression and stressors. Ok to continue Cymbalta same dose for now - 67m per day and add back the 368mper day in next week or two.  Restart HCTZ, continue lisinopril, and recheck in 3-4 weeks to make sure BP is better controlled.  I did check bloodwork today, but may need to be repeated once you have been on meds for 6 weeks.  Restart insulin, but at 50 units/day for right now.  We will likely need to use higher dose, but watch for any low blood sugar symptoms even at that lower dose.  If those occur, be seen here or emergency room right away.  Recheck in 3 to 4 weeks.  Thank you for coming in today.    Type 2 Diabetes Mellitus, Self Care, Adult When you have type 2 diabetes (type 2 diabetes mellitus), you must keep your blood sugar (glucose) under control. You can do this with:  Nutrition.  Exercise.  Lifestyle changes.  Medicines or insulin, if needed.  Support from your doctors and others.  How do I manage my blood sugar?  Check your blood sugar level every day, as often as told.  Call your doctor if your blood sugar is above your goal numbers for 2 tests in a row.  Have your A1c (hemoglobin A1c) level checked at least two times a year. Have it checked more often if your doctor tells you to. Your doctor will set treatment goals for you. Generally, you should have these blood sugar levels:  Before meals (preprandial): 80-130 mg/dL (4.4-7.2 mmol/L).  After meals (postprandial): lower than 180 mg/dL (10 mmol/L).  A1c level: less than 7%.   What do I need to know about high blood sugar? High blood sugar is called hyperglycemia. Know the signs of high blood sugar. Signs may include:  Feeling: ? Thirsty. ? Hungry. ? Very tired.  Needing to pee (urinate) more than usual.  Blurry vision.  What do I need to know about low blood sugar? Low blood sugar is called hypoglycemia. This  is when blood sugar is at or below 70 mg/dL (3.9 mmol/L). Symptoms may include:  Feeling: ? Hungry. ? Worried or nervous (anxious). ? Sweaty and clammy. ? Confused. ? Dizzy. ? Sleepy. ? Sick to your stomach (nauseous).  Having: ? A fast heartbeat (palpitations). ? A headache. ? A change in your vision. ? Jerky movements that you cannot control (seizure). ? Nightmares. ? Tingling or no feeling (numbness) around the mouth, lips, or tongue.  Having trouble with: ? Talking. ? Paying attention (concentrating). ? Moving (coordination). ? Sleeping.  Shaking.  Passing out (fainting).  Getting upset easily (irritability).  Treating low blood sugar  To treat low blood sugar, eat or drink something sugary right away. If you can think clearly and swallow safely, follow the 15:15 rule:  Take 15 grams of a fast-acting carb (carbohydrate). Some fast-acting carbs are: ? 1 tube of glucose gel. ? 3 sugar tablets (glucose pills). ? 6-8 pieces of hard candy. ? 4 oz (120 mL) of fruit juice. ? 4 oz (120 mL) regular (not diet) soda.  Check your blood sugar 15 minutes after you take the carb.  If your blood sugar is still at or below 70 mg/dL (3.9 mmol/L), take 15 grams of a carb again.  If your blood sugar does not go above 70 mg/dL (3.9 mmol/L) after 3 tries, get help right away.  After your blood sugar goes back to normal, eat a meal or a snack within 1 hour.  Treating very low blood sugar If your blood sugar is at or below 54 mg/dL (3 mmol/L), you have very low blood sugar (severe hypoglycemia). This is an emergency. Do not wait to see if the symptoms will go away. Get medical help right away. Call your local emergency services (911 in the [place] Do not drive yourself to the hospital. If you have very low blood sugar and you cannot eat or drink, you may need a glucagon shot (injection). A family member or friend should learn how to check your blood sugar and how to give you a glucagon  shot. Ask your doctor if you need to have a glucagon shot kit at home. What else is important to manage my diabetes? Medicine Follow these instructions about insulin and diabetes medicines:  Take them as told by your doctor.  Adjust them as told by your doctor.  Do not run out of them.  Having diabetes can raise your risk for other long-term conditions. These include heart or kidney disease. Your doctor may prescribe medicines to help prevent problems from diabetes. Food   Make healthy food choices. These include: ? Chicken, fish, egg whites, and beans. ? Oats, whole wheat, bulgur, brown rice, quinoa, and millet. ? Fresh fruits and vegetables. ? Low-fat dairy products. ? Nuts, avocado, olive oil, and canola oil.  Make a food plan with a specialist (dietitian).  Follow instructions from your doctor about what you cannot eat or drink.  Drink enough fluid to keep your pee (urine) clear or pale yellow.  Eat healthy snacks between healthy meals.  Keep track of carbs that you eat. Read food labels. Learn food serving sizes.  Follow your sick day plan when you cannot eat or drink normally. Make this plan with your doctor so it is ready to use. Activity  Exercise at least 3 times a week.  Do not go more than 2 days without exercising.  Talk with your doctor before you start a new exercise. Your doctor may need to adjust your insulin, medicines, or food. Lifestyle   Do not use any tobacco products. These include cigarettes, chewing tobacco, and e-cigarettes.If you need help quitting, ask your doctor.  Ask your doctor how much alcohol is safe for you.  Learn to deal with stress. If you need help with this, ask your doctor. Body care  Stay up to date with your shots (immunizations).  Have your eyes and feet checked by a doctor as often as told.  Check your skin and feet every day. Check for cuts, bruises, redness, blisters, or sores.  Brush your teeth and gums two times a  day.  Floss at least one time a day.  Go to the dentist least one time every 6 months.  Stay at a healthy weight. General instructions   Take over-the-counter and prescription medicines only as told by your doctor.  Share your diabetes care plan with: ? Your work or school. ? People you live with.  Check your pee (urine) for ketones: ? When you are sick. ? As told by your doctor.  Carry a card or wear jewelry that says that you have diabetes.  Ask your doctor: ? Do I need to meet with a diabetes educator? ? Where can I find a support group for people with diabetes?  Keep all follow-up visits as told by your doctor. This is important. Where to find  more information: To learn more about diabetes, visit:  American Diabetes Association: www.diabetes.org  American Association of Diabetes Educators: www.diabeteseducator.org/patient-resources  This information is not intended to replace advice given to you by your health care provider. Make sure you discuss any questions you have with your health care provider. Document Released: 01/19/2016 Document Revised: 03/04/2016 Document Reviewed: 10/31/2015 Elsevier Interactive Patient Education  [employer].   If you have lab work done today you will be contacted with your lab results within the next 2 weeks.  If you have not heard from [place] then please contact us. The fastest way to get your results is to register for My Chart.    IF you received an x-ray today, you will receive an invoice from [facility]. Please contact [facility] at [phone] with questions or concerns regarding your invoice.   IF you received labwork today, you will receive an invoice from [place]. Please contact [facility] at [phone] with questions or concerns regarding your invoice.   Our billing staff will not be able to assist you with questions regarding bills from these companies.  You will be contacted with the lab results  as soon as they are available. The fastest way to get your results is to activate your My Chart account. Instructions are located on the last page of this paperwork. If you have not heard from [place] regarding the results in 2 weeks, please contact this office.

## 2018-08-24 LAB — COMPREHENSIVE METABOLIC PANEL
ALT: 14 IU/L (ref 0–32)
AST: 17 IU/L (ref 0–40)
Albumin/Globulin Ratio: 1.6 (ref 1.2–2.2)
Albumin: 4.1 g/dL (ref 3.5–5.5)
Alkaline Phosphatase: 57 IU/L (ref 39–117)
BUN/Creatinine Ratio: 24 — ABNORMAL HIGH (ref 9–23)
BUN: 17 mg/dL (ref 6–24)
Bilirubin Total: 0.6 mg/dL (ref 0.0–1.2)
CALCIUM: 9.5 mg/dL (ref 8.7–10.2)
CO2: 21 mmol/L (ref 20–29)
CREATININE: 0.72 mg/dL (ref 0.57–1.00)
Chloride: 98 mmol/L (ref 96–106)
GFR calc Af Amer: 118 mL/min/{1.73_m2} (ref >59)
GFR, EST NON AFRICAN AMERICAN: 102 mL/min/{1.73_m2} (ref >59)
Globulin, Total: 2.6 g/dL (ref 1.5–4.5)
Glucose: 271 mg/dL — ABNORMAL HIGH (ref 65–99)
POTASSIUM: 4.1 mmol/L (ref 3.5–5.2)
Sodium: 135 mmol/L (ref 134–144)
Total Protein: 6.7 g/dL (ref 6.0–8.5)

## 2018-08-24 LAB — LIPID PANEL
CHOL/HDL RATIO: 6.4 ratio — AB (ref 0.0–4.4)
CHOLESTEROL TOTAL: 198 mg/dL (ref 100–199)
HDL: 31 mg/dL — ABNORMAL LOW (ref >39)
LDL CALC: 94 mg/dL (ref 0–99)
Triglycerides: 365 mg/dL — ABNORMAL HIGH (ref 0–149)
VLDL Cholesterol Cal: 73 mg/dL — ABNORMAL HIGH (ref 5–40)

## 2018-08-24 LAB — MICROALBUMIN / CREATININE URINE RATIO
Creatinine, Urine: 24.5 mg/dL
MICROALB/CREAT RATIO: 60 mg/g{creat} — AB (ref 0.0–30.0)
Microalbumin, Urine: 14.7 ug/mL

## 2018-09-06 ENCOUNTER — Ambulatory Visit: Payer: 59 | Admitting: Family Medicine

## 2018-09-20 ENCOUNTER — Ambulatory Visit: Payer: 59 | Admitting: Family Medicine

## 2018-09-22 ENCOUNTER — Other Ambulatory Visit: Payer: Self-pay

## 2018-09-22 ENCOUNTER — Ambulatory Visit (INDEPENDENT_AMBULATORY_CARE_PROVIDER_SITE_OTHER): Payer: 59 | Admitting: Family Medicine

## 2018-09-22 ENCOUNTER — Encounter: Payer: Self-pay | Admitting: Family Medicine

## 2018-09-22 VITALS — BP 125/82 | HR 88 | Temp 98.6°F | Ht 65.0 in | Wt 267.4 lb

## 2018-09-22 DIAGNOSIS — E785 Hyperlipidemia, unspecified: Secondary | ICD-10-CM

## 2018-09-22 DIAGNOSIS — G4733 Obstructive sleep apnea (adult) (pediatric): Secondary | ICD-10-CM | POA: Diagnosis not present

## 2018-09-22 DIAGNOSIS — F329 Major depressive disorder, single episode, unspecified: Secondary | ICD-10-CM

## 2018-09-22 DIAGNOSIS — E1165 Type 2 diabetes mellitus with hyperglycemia: Secondary | ICD-10-CM

## 2018-09-22 DIAGNOSIS — F32A Depression, unspecified: Secondary | ICD-10-CM

## 2018-09-22 DIAGNOSIS — J069 Acute upper respiratory infection, unspecified: Secondary | ICD-10-CM

## 2018-09-22 DIAGNOSIS — Z794 Long term (current) use of insulin: Secondary | ICD-10-CM

## 2018-09-22 DIAGNOSIS — Z9989 Dependence on other enabling machines and devices: Secondary | ICD-10-CM | POA: Diagnosis not present

## 2018-09-22 NOTE — Progress Notes (Signed)
Subjective:    Patient ID: Renee Ho, female    DOB: 1976-09-07, 42 y.o.   MRN: [id number]  HPI Renee Ho is a 42 y.o. female Presents today for: Chief Complaint  Patient presents with  . Hypertension    f/u   . Diabetes  . Medication Refill    for CPAP mask, headgear and tubing  Here for follow-up, last seen in November.  Had been off medication/nonadherent at that time.    Depression: Recommended to meet with therapist regarding her depression with stressors and continued on Cymbalta with plan to restart at 60 mg then add additional 30 mg in 1 to 2 weeks following. Won custody case yesterday - has helped mood.  Has talked to EAP.  On 90mg  Cymbalta. Depression sx's better.   Fighting off cold symptoms past 2 days - runny nose. No measured fever. Off for next 5 days.   Hypertension: BP Readings from Last 3 Encounters:  09/22/18 125/82  08/23/18 (!) 152/87  07/16/18 (!) 162/80  HCTZ was restarted continued on lisinopril after last visit. No new side effects.  Lab Results  Component Value Date   CREATININE 0.72 08/23/2018    Diabetes:  Lab Results  Component Value Date   HGBA1C 9.6 (A) 08/23/2018   HGBA1C 6.9 11/22/2017   HGBA1C 10.0 (H) 08/18/2017   Lab Results  Component Value Date   MICROALBUR 0.3 09/22/2015   LDLCALC 94 08/23/2018   CREATININE 0.72 08/23/2018  Uncontrolled last visit, restarted on insulin but at 50 units/day initially with hypoglycemic precautions. Continue on Farxiga 5 mg daily, metformin 1000 mg twice daily. Taking med daily.  Checking fasting blood sugars and has increased insulin to those readings. Now on 10 units, and FBS 153 this am. 2 hr PP under 200.  No symptomatic lows.  Min exercise. Gym one day last week.   Elevated urine microalbumin creatinine ratio of 60 on November 13th.  Hypertriglyceridemia on recent lipids likely due to hyperglycemia.  Has restarted Lipitor.   Review of Systems    Constitutional: Negative for fatigue and unexpected weight change.  Respiratory: Negative for chest tightness and shortness of breath.   Cardiovascular: Negative for chest pain, palpitations and leg swelling.  Gastrointestinal: Negative for abdominal pain and blood in stool.  Neurological: Negative for dizziness, syncope, light-headedness and headaches.       Objective:   Physical Exam Vitals signs reviewed.  Constitutional:      General: She is not in acute distress.    Appearance: She is well-developed.  HENT:     Head: Normocephalic and atraumatic.     Right Ear: Hearing, tympanic membrane, ear canal and external ear normal.     Left Ear: Hearing, tympanic membrane, ear canal and external ear normal.     Nose: Nose normal.     Mouth/Throat:     Pharynx: No oropharyngeal exudate.  Eyes:     Conjunctiva/sclera: Conjunctivae normal.     Pupils: Pupils are equal, round, and reactive to light.  Neck:     Vascular: No carotid bruit.  Cardiovascular:     Rate and Rhythm: Normal rate and regular rhythm.     Heart sounds: Normal heart sounds. No murmur.  Pulmonary:     Effort: Pulmonary effort is normal. No respiratory distress.     Breath sounds: Normal breath sounds. No wheezing or rhonchi.  Abdominal:     Palpations: Abdomen is soft. There is no pulsatile mass.  Tenderness: There is no abdominal tenderness.  Skin:    General: Skin is warm and dry.     Findings: No rash.  Neurological:     Mental Status: She is alert and oriented to person, place, and time.  Psychiatric:        Behavior: Behavior normal.       Assessment & Plan:   Renee Ho is a 42 y.o. female Type 2 diabetes mellitus with hyperglycemia, with long-term current use of insulin (HCC)  -Improving with restarting medication.  Continue same doses for now, but if fastings remain over 150 in the next few weeks, can increase basal insulin by 2-4 units.  Hypoglycemic precautions.  Update by  MyChart next few weeks, office visit 2 months  Depression, unspecified depression type  -Improved back on medication.  Some situational stressors also improved.   Hyperlipidemia, unspecified hyperlipidemia type  -Now back on medication.  Plan to recheck labs at next visit.  No change in current regimen as tolerated  Upper respiratory infection:  -Symptomatic care discussed with saline nasal spray, Mucinex, fluids and rest.  RTC precautions and handout given.  Advised to contact sleep specialist to review any needs for mask supplies.  If unable to obtain information at their office, I can help if needed.  No orders of the defined types were placed in this encounter.  Patient Instructions     Saline nasal spray for congestion , over the counter mucinex or mucinex DM for cough drink plenty of fluids and rest. If any fevers or worsening, return for recheck.  Return to the clinic or go to the nearest emergency room if any of your symptoms worsen or new symptoms occur.  Okay to continue same dose of insulin for now as well as the farxiga and metformin.  In the next 2 weeks if fasting blood sugars are still remaining over 150, can increase by 2-4 more units and give me an update by MyChart.   recheck for A1c and cholesterol in 2 months.   Thanks for coming in today.   Contact sleep center about CPAP supplies. Let me know if I still need to send in a Rx.   [facility]  Address: [address], [place], [place] [address]  Phone: [phone]   Upper Respiratory Infection, Adult Most upper respiratory infections (URIs) are caused by a virus. A URI affects the nose, throat, and upper air passages. The most common type of URI is often called "the common cold." Follow these instructions at home:  Take medicines only as told by your doctor.  Gargle warm saltwater or take cough drops to comfort your throat as told by your doctor.  Use a warm mist humidifier or  inhale steam from a shower to increase air moisture. This may make it easier to breathe.  Drink enough fluid to keep your pee (urine) clear or pale yellow.  Eat soups and other clear broths.  Have a healthy diet.  Rest as needed.  Go back to work when your fever is gone or your doctor says it is okay. ? You may need to stay home longer to avoid giving your URI to others. ? You can also wear a face mask and wash your hands often to prevent spread of the virus.  Use your inhaler more if you have asthma.  Do not use any tobacco products, including cigarettes, chewing tobacco, or electronic cigarettes. If you need help quitting, ask your doctor. Contact a  doctor if:  You are getting worse, not better.  Your symptoms are not helped by medicine.  You have chills.  You are getting more short of breath.  You have brown or red mucus.  You have yellow or brown discharge from your nose.  You have pain in your face, especially when you bend forward.  You have a fever.  You have puffy (swollen) neck glands.  You have pain while swallowing.  You have white areas in the back of your throat. Get help right away if:  You have very bad or constant: ? Headache. ? Ear pain. ? Pain in your forehead, behind your eyes, and over your cheekbones (sinus pain). ? Chest pain.  You have long-lasting (chronic) lung disease and any of the following: ? Wheezing. ? Long-lasting cough. ? Coughing up blood. ? A change in your usual mucus.  You have a stiff neck.  You have changes in your: ? Vision. ? Hearing. ? Thinking. ? Mood. This information is not intended to replace advice given to you by your health care provider. Make sure you discuss any questions you have with your health care provider. Document Released: 03/15/2008 Document Revised: 05/30/2016 Document Reviewed: 01/02/2014 Elsevier Interactive Patient Education  [employer].  If you have lab work done today you will be  contacted with your lab results within the next 2 weeks.  If you have not heard from [place] then please contact us. The fastest way to get your results is to register for My Chart.   IF you received an x-ray today, you will receive an invoice from [facility]. Please contact [facility] at [phone] with questions or concerns regarding your invoice.   IF you received labwork today, you will receive an invoice from [facility]. Please contact [facility] at [phone] with questions or concerns regarding your invoice.   Our billing staff will not be able to assist you with questions regarding bills from these companies.  You will be contacted with the lab results as soon as they are available. The fastest way to get your results is to activate your My Chart account. Instructions are located on the last page of this paperwork. If you have not heard from [place] regarding the results in 2 weeks, please contact this office.       Signed,   [name], MD [facility].  09/24/18 2:30 PM

## 2018-09-22 NOTE — Patient Instructions (Addendum)
Saline nasal spray for congestion , over the counter mucinex or mucinex DM for cough drink plenty of fluids and rest. If any fevers or worsening, return for recheck.  Return to the clinic or go to the nearest emergency room if any of your symptoms worsen or new symptoms occur.  Okay to continue same dose of insulin for now as well as the farxiga and metformin.  In the next 2 weeks if fasting blood sugars are still remaining over 150, can increase by 2-4 more units and give me an update by MyChart.   recheck for A1c and cholesterol in 2 months.   Thanks for coming in today.   Contact sleep center about CPAP supplies. Let me know if I still need to send in a Rx.   [facility]  Address: [address], [place], [place] [address]  Phone: [phone]   Upper Respiratory Infection, Adult Most upper respiratory infections (URIs) are caused by a virus. A URI affects the nose, throat, and upper air passages. The most common type of URI is often called "the common cold." Follow these instructions at home:  Take medicines only as told by your doctor.  Gargle warm saltwater or take cough drops to comfort your throat as told by your doctor.  Use a warm mist humidifier or inhale steam from a shower to increase air moisture. This may make it easier to breathe.  Drink enough fluid to keep your pee (urine) clear or pale yellow.  Eat soups and other clear broths.  Have a healthy diet.  Rest as needed.  Go back to work when your fever is gone or your doctor says it is okay. ? You may need to stay home longer to avoid giving your URI to others. ? You can also wear a face mask and wash your hands often to prevent spread of the virus.  Use your inhaler more if you have asthma.  Do not use any tobacco products, including cigarettes, chewing tobacco, or electronic cigarettes. If you need help quitting, ask your doctor. Contact a doctor if:  You are getting worse,  not better.  Your symptoms are not helped by medicine.  You have chills.  You are getting more short of breath.  You have brown or red mucus.  You have yellow or brown discharge from your nose.  You have pain in your face, especially when you bend forward.  You have a fever.  You have puffy (swollen) neck glands.  You have pain while swallowing.  You have white areas in the back of your throat. Get help right away if:  You have very bad or constant: ? Headache. ? Ear pain. ? Pain in your forehead, behind your eyes, and over your cheekbones (sinus pain). ? Chest pain.  You have long-lasting (chronic) lung disease and any of the following: ? Wheezing. ? Long-lasting cough. ? Coughing up blood. ? A change in your usual mucus.  You have a stiff neck.  You have changes in your: ? Vision. ? Hearing. ? Thinking. ? Mood. This information is not intended to replace advice given to you by your health care provider. Make sure you discuss any questions you have with your health care provider. Document Released: 03/15/2008 Document Revised: 05/30/2016 Document Reviewed: 01/02/2014 Elsevier Interactive Patient Education  [employer].  If you have lab work done today you will be contacted with your lab results within the next 2 weeks.  If you have  not heard from us then please contact us. The fastest way to get your results is to register for My Chart.   IF you received an x-ray today, you will receive an invoice from [facility]. Please contact [facility] at [phone] with questions or concerns regarding your invoice.   IF you received labwork today, you will receive an invoice from [facility]. Please contact [facility] at [phone] with questions or concerns regarding your invoice.   Our billing staff will not be able to assist you with questions regarding bills from these companies.  You will be contacted with the lab results as soon as they are  available. The fastest way to get your results is to activate your My Chart account. Instructions are located on the last page of this paperwork. If you have not heard from us regarding the results in 2 weeks, please contact this office.

## 2018-09-24 ENCOUNTER — Encounter: Payer: Self-pay | Admitting: Family Medicine

## 2018-10-03 ENCOUNTER — Other Ambulatory Visit: Payer: Self-pay | Admitting: Family Medicine

## 2018-10-03 DIAGNOSIS — Z794 Long term (current) use of insulin: Principal | ICD-10-CM

## 2018-10-03 DIAGNOSIS — E1165 Type 2 diabetes mellitus with hyperglycemia: Secondary | ICD-10-CM

## 2018-10-03 MED ORDER — INSULIN GLARGINE 100 UNIT/ML ~~LOC~~ SOLN: 50.0000 [IU] | Freq: Every day | SUBCUTANEOUS | 4 refills | Status: DC

## 2018-10-03 NOTE — Telephone Encounter (Signed)
Rx refilled due to : protocol is met and patient need. Rx still needs to be reviewed and adjusted to patient use.

## 2018-10-03 NOTE — Telephone Encounter (Signed)
Need to have provider review Rx- may need to change SIG and amount prescribed so patient can get correct amount and not run out.- see OV note and patient note.

## 2018-10-03 NOTE — Telephone Encounter (Signed)
Copied from CRM [id number]. Topic: Quick Communication - Rx Refill/Question >> Oct 03, 2018  9:35 AM Marylen PontoMcneil, Ja-Kwan wrote: Pt stated that she is out of her medication due to it being increased to 100 units. Pt stated she will not have any for tomorrow if it is not refilled   Medication: insulin glargine (LANTUS) 100 UNIT/ML injection  Has the patient contacted their pharmacy? yes   Preferred Pharmacy (with phone number or street name): [facility] [place] - [address] [phone] (Phone)  [phone] (Fax)  Agent: Please be advised that RX refills may take up to 3 business days. We ask that you follow-up with your pharmacy.

## 2018-10-09 ENCOUNTER — Telehealth: Payer: Self-pay | Admitting: Family Medicine

## 2018-10-09 DIAGNOSIS — E1165 Type 2 diabetes mellitus with hyperglycemia: Secondary | ICD-10-CM

## 2018-10-09 DIAGNOSIS — Z794 Long term (current) use of insulin: Principal | ICD-10-CM

## 2018-10-09 MED ORDER — INSULIN GLARGINE 100 UNIT/ML ~~LOC~~ SOLN: 100.0000 [IU] | Freq: Every day | SUBCUTANEOUS | 3 refills | Status: DC

## 2018-10-09 MED FILL — LANTUS 100 UNITS/ML VIAL: 100 | 30 days supply | Qty: 30 | Fill #0

## 2018-10-09 NOTE — Telephone Encounter (Signed)
Reordered at current dosing.

## 2018-10-09 NOTE — Telephone Encounter (Signed)
Please Advise

## 2018-10-09 NOTE — Telephone Encounter (Signed)
Copied from CRM [id number]. Topic: Quick Communication - Rx Refill/Question >> Oct 03, 2018  9:35 AM Marylen PontoMcneil, Ja-Kwan wrote: Pt stated that she is out of her medication due to it being increased to 100 units. Pt stated she will not have any for tomorrow if it is not refilled   Medication: insulin glargine (LANTUS) 100 UNIT/ML injection  Has the patient contacted their pharmacy? yes   Preferred Pharmacy (with phone number or street name): [facility] [place] - [address] [phone] (Phone)  [phone] (Fax)  Agent: Please be advised that RX refills may take up to 3 business days. We ask that you follow-up with your pharmacy. >> Oct 06, 2018  2:15 PM Floria RavelingStovall, Shana A wrote: Pharmacy called in and stated that pt told them that the Sig: Inject 0.5 mLs (50 Units total) into the skin daily,  Was suppose to be increased and the 0.5 daily is not the correct dose.  Please advise ?   Best number [phone]

## 2018-11-08 ENCOUNTER — Telehealth: Payer: Self-pay | Admitting: Family Medicine

## 2018-11-08 NOTE — Telephone Encounter (Signed)
Copied from CRM [id number]. Topic: Quick Communication - Rx Refill/Question >> Nov 08, 2018  5:02 PM Zada GirtLander, [place] wrote: Medication: hydrochlorothiazide (HYDRODIURIL) 25 MG tablet  Has the patient contacted their pharmacy? Yes.   (Agent: If no, request that the patient contact the pharmacy for the refill.) (Agent: If yes, when and what did the pharmacy advise?)  Preferred Pharmacy (with phone number or street name): [facility] [place], [place] - [address] [place] [address] Phone: [phone] Fax: [phone]  Agent: Please be advised that RX refills may take up to 3 business days. We ask that you follow-up with your pharmacy.

## 2018-11-09 ENCOUNTER — Other Ambulatory Visit: Payer: Self-pay | Admitting: Emergency Medicine

## 2018-11-09 DIAGNOSIS — I10 Essential (primary) hypertension: Secondary | ICD-10-CM

## 2018-11-09 MED ORDER — HYDROCHLOROTHIAZIDE 25 MG PO TABS: 25.0000 mg | ORAL_TABLET | Freq: Every day | ORAL | 1 refills | Status: DC

## 2018-11-09 MED FILL — HYDROCHLOROTHIAZIDE 25 MG T: 25 | 30 days supply | Qty: 30 | Fill #0

## 2018-11-09 NOTE — Telephone Encounter (Signed)
rx sent

## 2018-11-13 MED FILL — LANTUS 100 UNITS/ML VIAL: 100 | 30 days supply | Qty: 30 | Fill #1

## 2018-11-20 ENCOUNTER — Other Ambulatory Visit: Payer: Self-pay

## 2018-11-20 ENCOUNTER — Ambulatory Visit (INDEPENDENT_AMBULATORY_CARE_PROVIDER_SITE_OTHER): Payer: No Typology Code available for payment source | Admitting: Family Medicine

## 2018-11-20 ENCOUNTER — Encounter: Payer: Self-pay | Admitting: Family Medicine

## 2018-11-20 VITALS — BP 127/81 | HR 79 | Temp 98.4°F | Resp 14 | Ht 65.0 in | Wt 267.4 lb

## 2018-11-20 DIAGNOSIS — E785 Hyperlipidemia, unspecified: Secondary | ICD-10-CM

## 2018-11-20 DIAGNOSIS — F329 Major depressive disorder, single episode, unspecified: Secondary | ICD-10-CM

## 2018-11-20 DIAGNOSIS — I10 Essential (primary) hypertension: Secondary | ICD-10-CM

## 2018-11-20 DIAGNOSIS — F32A Depression, unspecified: Secondary | ICD-10-CM

## 2018-11-20 DIAGNOSIS — E1165 Type 2 diabetes mellitus with hyperglycemia: Secondary | ICD-10-CM | POA: Diagnosis not present

## 2018-11-20 DIAGNOSIS — Z794 Long term (current) use of insulin: Secondary | ICD-10-CM | POA: Diagnosis not present

## 2018-11-20 LAB — POCT GLYCOSYLATED HEMOGLOBIN (HGB A1C): Hemoglobin A1C: 8.4 % — AB (ref 4.0–5.6)

## 2018-11-20 LAB — GLUCOSE, POCT (MANUAL RESULT ENTRY): POC Glucose: 161 mg/dl — AB (ref 70–99)

## 2018-11-20 MED ORDER — GLUCOSE BLOOD VI STRP: ORAL_STRIP | 1 refills | Status: DC

## 2018-11-20 MED ORDER — HYDROCHLOROTHIAZIDE 25 MG PO TABS: 25.0000 mg | ORAL_TABLET | Freq: Every day | ORAL | 1 refills | Status: DC

## 2018-11-20 MED ORDER — DULOXETINE HCL 30 MG PO CPEP: 30.0000 mg | ORAL_CAPSULE | Freq: Every day | ORAL | 1 refills | Status: DC

## 2018-11-20 MED ORDER — ATORVASTATIN CALCIUM 10 MG PO TABS: 10.0000 mg | ORAL_TABLET | Freq: Every day | ORAL | 1 refills | Status: DC

## 2018-11-20 MED ORDER — "INSULIN SYRINGE-NEEDLE U-100 30G X 5/16"" 0.5 ML MISC": 3 refills | Status: DC

## 2018-11-20 MED ORDER — DAPAGLIFLOZIN PROPANEDIOL 10 MG PO TABS: 10.0000 mg | ORAL_TABLET | Freq: Every day | ORAL | 1 refills | Status: DC

## 2018-11-20 MED ORDER — INSULIN GLARGINE 100 UNIT/ML ~~LOC~~ SOLN: 100.0000 [IU] | Freq: Every day | SUBCUTANEOUS | 1 refills | Status: DC

## 2018-11-20 MED ORDER — LISINOPRIL 5 MG PO TABS: 5.0000 mg | ORAL_TABLET | Freq: Every day | ORAL | 1 refills | Status: DC

## 2018-11-20 MED ORDER — METFORMIN HCL 1000 MG PO TABS: 1000.0000 mg | ORAL_TABLET | Freq: Two times a day (BID) | ORAL | 1 refills | Status: DC

## 2018-11-20 MED ORDER — DULOXETINE HCL 60 MG PO CPEP: 60.0000 mg | ORAL_CAPSULE | Freq: Every day | ORAL | 1 refills | Status: DC

## 2018-11-20 MED FILL — FREESTYLE LITE TEST STRIP: 25 days supply | Qty: 100 | Fill #0

## 2018-11-20 MED FILL — ULTICARE SYR 0.5 ML 30GX5/1: 30G X 5/16" | 90 days supply | Qty: 100 | Fill #0

## 2018-11-20 MED FILL — metFORMIN HCL 1000 MG TABS: 1000 | 90 days supply | Qty: 180 | Fill #0

## 2018-11-20 MED FILL — DULoxetine HCL 30 MG CPEP: 30 | 90 days supply | Qty: 90 | Fill #0

## 2018-11-20 MED FILL — FARXIGA 10 MG TABLET: 10 | 90 days supply | Qty: 90 | Fill #0

## 2018-11-20 MED FILL — LISINOPRIL 5 MG TABLET: 5 | 90 days supply | Qty: 90 | Fill #0

## 2018-11-20 MED FILL — DULoxetine HCL 60 MG CPEP: 60 | 90 days supply | Qty: 90 | Fill #0

## 2018-11-20 MED FILL — ATORVASTATIN 10 MG TABLET: 10 | 90 days supply | Qty: 90 | Fill #0

## 2018-11-20 NOTE — Progress Notes (Signed)
Sha 

## 2018-11-20 NOTE — Progress Notes (Signed)
Subjective:    Patient ID: Renee Ho, female    DOB: 11/14/1975, 43 y.o.   MRN: [id number]  HPI Renee Ho is a 43 y.o. female Presents today for: Chief Complaint  Patient presents with  . Diabetes    f/u on lab work. Blood sugar this am 181 after a cheat day   Diabetes:   Lab Results  Component Value Date   HGBA1C 9.6 (A) 08/23/2018   HGBA1C 6.9 11/22/2017   HGBA1C 10.0 (H) 08/18/2017   Lab Results  Component Value Date   MICROALBUR 0.3 09/22/2015   LDLCALC 94 08/23/2018   CREATININE 0.72 08/23/2018   Here for follow-up.  Had been off medication in November visit.  Improving home readings with restarting insulin 100units per day, farkIGA 5 mg daily and metformin 1000 mg twice daily.  When seen in December her 2-hour postprandials were under 200, fasting approximately 150 that morning.  Denied any symptomatic lows.  Minimal exercise at that time, with exercise in gym 1 day previous week. 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  She is on 5 mg lisinopril daily. Up-to-date on diabetes health maintenance.  Home readings under 200.  Higher than usual this morning as small piece of cake yesterday at birthday party.  No symptomatic lows.  Exercise 1 day per week. Working 2 jobs. Only day off and raising 43yo.  Trying to take stairs at work.   . Wt Readings from Last 3 Encounters:  11/20/18 267 lb 6.4 oz (121.3 kg)  09/22/18 267 lb 6.4 oz (121.3 kg)  08/23/18 275 lb 12.8 oz (125.1 kg)     Patient Active Problem List   Diagnosis Date Noted  . Hyperglycemia 12/22/2016  . Injury of foot, left 07/18/2013  . HTN (hypertension) 09/18/2012  . Morbid obesity ([place]) 09/18/2012  . S/P hysterectomy 06/20/2012  . H/O colonoscopy with polypectomy 06/20/2012  . Depression 12/30/2011  . Plantar fasciitis 04/22/2011  . HYPERTRIGLYCERIDEMIA 02/21/2008  . DIABETES MELLITUS, TYPE II 02/24/2007  . POLYCYSTIC OVARIAN  DISEASE 02/24/2007  . HYPERTENSION 02/24/2007  . Sleep apnea 02/24/2007   Past Medical History:  Diagnosis Date  . Depression   . Diabetes mellitus   . Diabetes mellitus without complication ([place])   . Diverticula of intestine   . High cholesterol   . Hypertension   . OSA on CPAP    CPAP pressure= 15  . Polycystic ovarian syndrome   . Sleep apnea    on CPAP with pressure setting= 15   Past Surgical History:  Procedure Laterality Date  . ABDOMINAL HYSTERECTOMY    . CYST REMOVAL NECK    . KNEE ARTHROSCOPY  07/11/2012   Procedure: ARTHROSCOPY KNEE;  Surgeon: Meredith Pel, MD;  Location: [place];  Service: Orthopedics;  Laterality: Right;  Right knee arthroscopy with debridement  . KNEE SURGERY    . OVARIAN CYST REMOVAL    . WISDOM TOOTH EXTRACTION  05/11/2017   Allergies  Allergen Reactions  . Morphine And Related Other (See Comments)    Migraines  . Reglan [Metoclopramide] Other (See Comments)    Psychotic episodes  . Toradol [Ketorolac Tromethamine] Rash   Prior to Admission medications   Medication Sig Start Date End Date Taking? Authorizing Provider  atorvastatin (LIPITOR) 10 MG tablet Take 1 tablet (10 mg total) by mouth daily. 08/23/18  Yes Wendie Agreste, MD  blood glucose meter kit and supplies KIT Dispense based on patient and insurance preference. Use up  to four times daily as directed. (FOR ICD-9 250.00, 250.01). 12/09/16  Yes Wendie Agreste, MD  dapagliflozin propanediol (FARXIGA) 5 MG TABS tablet Take 5 mg by mouth daily. 08/23/18  Yes Wendie Agreste, MD  DULoxetine (CYMBALTA) 30 MG capsule Take 1 capsule (30 mg total) by mouth daily. 08/23/18  Yes Wendie Agreste, MD  DULoxetine (CYMBALTA) 60 MG capsule Take 1 capsule (60 mg total) by mouth daily. 08/23/18  Yes Wendie Agreste, MD  glucose blood (FREESTYLE LITE) test strip USE AS DIRECTED THREE TO FOUR TIMES DAILY 08/23/18  Yes Wendie Agreste, MD  hydrochlorothiazide (HYDRODIURIL) 25 MG tablet  Take 1 tablet (25 mg total) by mouth daily. 11/09/18  Yes Wendie Agreste, MD  insulin glargine (LANTUS) 100 UNIT/ML injection Inject 1 mL (100 Units total) into the skin daily. 10/09/18  Yes Wendie Agreste, MD  Insulin Syringe-Needle U-100 (TRUEPLUS INSULIN SYRINGE) 30G X 5/16" 0.5 ML MISC USE AS DIRECTED EVERY DAY 07/04/17  Yes Wendie Agreste, MD  lisinopril (PRINIVIL,ZESTRIL) 5 MG tablet Take 1 tablet (5 mg total) by mouth daily. 08/23/18  Yes Wendie Agreste, MD  metFORMIN (GLUCOPHAGE) 1000 MG tablet Take 1 tablet (1,000 mg total) by mouth 2 (two) times daily with a meal. 08/23/18  Yes Wendie Agreste, MD   Social History   Socioeconomic History  . Marital status: Married    Spouse name: Not on file  . Number of children: 1  . Years of education: College  . Highest education level: Not on file  Occupational History  . Not on file  Social Needs  . Financial resource strain: Not on file  . Food insecurity:    Worry: Not on file    Inability: Not on file  . Transportation needs:    Medical: Not on file    Non-medical: Not on file  Tobacco Use  . Smoking status: Former Smoker    Packs/day: 1.00    Types: Cigarettes    Last attempt to quit: 12/18/2016    Years since quitting: 1.9  . Smokeless tobacco: Never Used  . Tobacco comment: stopped smoking on 02/17/17  Substance and Sexual Activity  . Alcohol use: No    Alcohol/week: 0.0 standard drinks    Comment: Rarely.  . Drug use: No  . Sexual activity: Never  Lifestyle  . Physical activity:    Days per week: Not on file    Minutes per session: Not on file  . Stress: Not on file  Relationships  . Social connections:    Talks on phone: Not on file    Gets together: Not on file    Attends religious service: Not on file    Active member of club or organization: Not on file    Attends meetings of clubs or organizations: Not on file    Relationship status: Not on file  . Intimate partner violence:    Fear of current  or ex partner: Not on file    Emotionally abused: Not on file    Physically abused: Not on file    Forced sexual activity: Not on file  Other Topics Concern  . Not on file  Social History Narrative   ** Merged History Encounter **   Drinks diet Dr. Malachi Bonds about 1 a day.         Review of Systems  Constitutional: Negative for fatigue and unexpected weight change.  Respiratory: Negative for chest tightness and shortness of breath.  Cardiovascular: Negative for chest pain, palpitations and leg swelling.  Gastrointestinal: Negative for abdominal pain and blood in stool.  Neurological: Negative for dizziness, syncope, light-headedness and headaches.       Objective:   Physical Exam Vitals signs reviewed.  Constitutional:      Appearance: She is well-developed.  HENT:     Head: Normocephalic and atraumatic.  Eyes:     Conjunctiva/sclera: Conjunctivae normal.     Pupils: Pupils are equal, round, and reactive to light.  Neck:     Vascular: No carotid bruit.  Cardiovascular:     Rate and Rhythm: Normal rate and regular rhythm.     Heart sounds: Normal heart sounds.  Pulmonary:     Effort: Pulmonary effort is normal.     Breath sounds: Normal breath sounds.  Abdominal:     Palpations: Abdomen is soft. There is no pulsatile mass.     Tenderness: There is no abdominal tenderness.  Skin:    General: Skin is warm and dry.  Neurological:     Mental Status: She is alert and oriented to person, place, and time.  Psychiatric:        Behavior: Behavior normal.    Vitals:   11/20/18 1013  BP: 127/81  Pulse: 79  Resp: 14  Temp: 98.4 F (36.9 C)  TempSrc: Oral  SpO2: 97%  Weight: 267 lb 6.4 oz (121.3 kg)  Height: _0  (1.651 m)      Results for orders placed or performed in visit on 11/20/18  POCT glucose (manual entry)  Result Value Ref Range   POC Glucose 161 (A) 70 - 99 mg/dl  POCT glycosylated hemoglobin (Hb A1C)  Result Value Ref Range   Hemoglobin A1C 8.4 (A)  4.0 - 5.6 %   HbA1c POC (<> result, manual entry)     HbA1c, POC (prediabetic range)     HbA1c, POC (controlled diabetic range)         Assessment & Plan:   Renee Ho is a 43 y.o. female Type 2 diabetes mellitus with hyperglycemia, with long-term current use of insulin ([place]) - Plan: POCT glucose (manual entry), POCT glycosylated hemoglobin (Hb A1C), Insulin Syringe-Needle U-100 (TRUEPLUS INSULIN SYRINGE) 30G X 5/16" 0.5 ML MISC, insulin glargine (LANTUS) 100 UNIT/ML injection, lisinopril (PRINIVIL,ZESTRIL) 5 MG tablet, metFORMIN (GLUCOPHAGE) 1000 MG tablet  -Improved.  Increase Farxiga to 10 mg daily, continue metformin and Lantus at same dose for now.  -Small increases in exercise/activity during the week may be helpful in addition to her once per week exercise session.  Discussed ways to incorporate activity into usual daytime routine.  -Recheck 3 months  Essential hypertension - Plan: hydrochlorothiazide (HYDRODIURIL) 25 MG tablet, lisinopril (PRINIVIL,ZESTRIL) 5 MG tablet  -Stable, tolerating current regimen.  Microalbuminuria, continue ACE inhibitor  Hyperlipidemia, unspecified hyperlipidemia type - Plan: atorvastatin (LIPITOR) 10 MG tablet  -Tolerating Lipitor, continue same dose.  Depression, unspecified depression type - Plan: DULoxetine (CYMBALTA) 30 MG capsule, DULoxetine (CYMBALTA) 60 MG capsule  -Stable, continue Cymbalta 90 mg daily.  Meds ordered this encounter  Medications  . hydrochlorothiazide (HYDRODIURIL) 25 MG tablet    Sig: Take 1 tablet (25 mg total) by mouth daily.    Dispense:  90 tablet    Refill:  1  . Insulin Syringe-Needle U-100 (TRUEPLUS INSULIN SYRINGE) 30G X 5/16" 0.5 ML MISC    Sig: USE AS DIRECTED EVERY DAY with insulin pen    Dispense:  100 each    Refill:  3  .  atorvastatin (LIPITOR) 10 MG tablet    Sig: Take 1 tablet (10 mg total) by mouth daily.    Dispense:  90 tablet    Refill:  1  . DULoxetine (CYMBALTA) 30 MG capsule    Sig:  Take 1 capsule (30 mg total) by mouth daily.    Dispense:  90 capsule    Refill:  1  . DULoxetine (CYMBALTA) 60 MG capsule    Sig: Take 1 capsule (60 mg total) by mouth daily.    Dispense:  90 capsule    Refill:  1  . insulin glargine (LANTUS) 100 UNIT/ML injection    Sig: Inject 1 mL (100 Units total) into the skin daily.    Dispense:  90 mL    Refill:  1  . lisinopril (PRINIVIL,ZESTRIL) 5 MG tablet    Sig: Take 1 tablet (5 mg total) by mouth daily.    Dispense:  90 tablet    Refill:  1  . metFORMIN (GLUCOPHAGE) 1000 MG tablet    Sig: Take 1 tablet (1,000 mg total) by mouth 2 (two) times daily with a meal.    Dispense:  180 tablet    Refill:  1  . glucose blood (FREESTYLE LITE) test strip    Sig: USE AS DIRECTED THREE TO FOUR TIMES DAILY    Dispense:  100 each    Refill:  1  . dapagliflozin propanediol (FARXIGA) 10 MG TABS tablet    Sig: Take 10 mg by mouth daily.    Dispense:  90 tablet    Refill:  1   Patient Instructions    Increase Farxiga to 10 mg/day.  Continue metformin same dose, insulin same dose.  Continue to monitor fasting and postprandial blood sugars to help guide further changes if needed.  Incorporating some form of exercise into a few other days per week such as walking can also be helpful.  Thank you for coming in today, recheck 3 months.    If you have lab work done today you will be contacted with your lab results within the next 2 weeks.  If you have not heard from [place] then please contact us. The fastest way to get your results is to register for My Chart.   IF you received an x-ray today, you will receive an invoice from [facility]. Please contact [facility] at [phone] with questions or concerns regarding your invoice.   IF you received labwork today, you will receive an invoice from [facility]. Please contact [facility] at [phone] with questions or concerns regarding your invoice.   Our billing staff will not be able to assist  you with questions regarding bills from these companies.  You will be contacted with the lab results as soon as they are available. The fastest way to get your results is to activate your My Chart account. Instructions are located on the last page of this paperwork. If you have not heard from [place] regarding the results in 2 weeks, please contact this office.       Signed,   [name], MD [facility].  11/20/18 10:49 AM

## 2018-11-20 NOTE — Patient Instructions (Addendum)
  Increase Farxiga to 10 mg/day.  Continue metformin same dose, insulin same dose.  Continue to monitor fasting and postprandial blood sugars to help guide further changes if needed.  Incorporating some form of exercise into a few other days per week such as walking can also be helpful.  Thank you for coming in today, recheck 3 months.    If you have lab work done today you will be contacted with your lab results within the next 2 weeks.  If you have not heard from [place] then please contact us. The fastest way to get your results is to register for My Chart.   IF you received an x-ray today, you will receive an invoice from [facility]. Please contact [facility] at [phone] with questions or concerns regarding your invoice.   IF you received labwork today, you will receive an invoice from [place]. Please contact [facility] at [phone] with questions or concerns regarding your invoice.   Our billing staff will not be able to assist you with questions regarding bills from these companies.  You will be contacted with the lab results as soon as they are available. The fastest way to get your results is to activate your My Chart account. Instructions are located on the last page of this paperwork. If you have not heard from [place] regarding the results in 2 weeks, please contact this office.

## 2018-12-14 MED FILL — HYDROCHLOROTHIAZIDE 25 MG T: 25 | 30 days supply | Qty: 30 | Fill #1

## 2018-12-22 MED FILL — CYCLOBENZAPRINE 10 MG TAB: 10 | 20 days supply | Qty: 20 | Fill #0

## 2018-12-27 MED FILL — LANTUS 100 UNITS/ML VIAL: 100 | 30 days supply | Qty: 30 | Fill #2 | Status: TO

## 2019-01-01 ENCOUNTER — Ambulatory Visit: Payer: No Typology Code available for payment source | Admitting: Family Medicine

## 2019-01-23 ENCOUNTER — Other Ambulatory Visit: Payer: Self-pay

## 2019-01-23 DIAGNOSIS — Z794 Long term (current) use of insulin: Principal | ICD-10-CM

## 2019-01-23 DIAGNOSIS — E1165 Type 2 diabetes mellitus with hyperglycemia: Secondary | ICD-10-CM

## 2019-01-26 MED FILL — HYDROCHLOROTHIAZIDE 25 MG T: 25 | 90 days supply | Qty: 90 | Fill #0

## 2019-01-29 ENCOUNTER — Telehealth (INDEPENDENT_AMBULATORY_CARE_PROVIDER_SITE_OTHER): Payer: No Typology Code available for payment source | Admitting: Family Medicine

## 2019-01-29 ENCOUNTER — Other Ambulatory Visit: Payer: Self-pay

## 2019-01-29 DIAGNOSIS — Z794 Long term (current) use of insulin: Secondary | ICD-10-CM

## 2019-01-29 DIAGNOSIS — E1129 Type 2 diabetes mellitus with other diabetic kidney complication: Secondary | ICD-10-CM | POA: Diagnosis not present

## 2019-01-29 DIAGNOSIS — R809 Proteinuria, unspecified: Secondary | ICD-10-CM

## 2019-01-29 NOTE — Progress Notes (Signed)
Virtual Visit via telephone.  Note  I connected with Renee Ho on 01/29/19 at 9:47 AM phoneand verified that I am speaking with the correct person using two identifiers.   I discussed the limitations, risks, security and privacy concerns of performing an evaluation and management service by telephone and the availability of in person appointments. I also discussed with the patient that there may be a patient responsible charge related to this service. The patient expressed understanding and agreed to proceed, consent obtained  Chief complaint:  Diabetes  History of Present Illness: Renee Ho is a 43 y.o. female   Diabetes: Complicated by hyperglycemia, microalbuminuria  -was improving at last visit in February.  Plan to increase of Farxiga to 10 mg daily, continue metformin same dose.  Continued Lantus at 100 units/day. She is on statin as well as ACE inhibitor.  Weight was stable at that time at 267, down from 275 in November 2019.  Did recommend some increase in exercise/activity during the week. Microalbumin: Ratio of 60 on 08/23/2018 Optho, foot exam, pneumovax: Up-to-date  Home readings under 200. Usually 170-180. Low 130, high 196.  No symptomatic lows.  No new side effects of meds, no mycotic infections.  Limited exercise as not able to go to gym.  Wt Readings from Last 3 Encounters:  11/20/18 267 lb 6.4 oz (121.3 kg)  09/22/18 267 lb 6.4 oz (121.3 kg)  08/23/18 275 lb 12.8 oz (125.1 kg)  weight 265 at work.   Lab Results  Component Value Date   HGBA1C 8.4 (A) 11/20/2018   HGBA1C 9.6 (A) 08/23/2018   HGBA1C 6.9 11/22/2017   Lab Results  Component Value Date   MICROALBUR 0.3 09/22/2015   [place] 94 08/23/2018   CREATININE 0.72 08/23/2018     Patient Active Problem List   Diagnosis Date Noted  . Hyperglycemia 12/22/2016  . Injury of foot, left 07/18/2013  . HTN (hypertension) 09/18/2012  . Morbid obesity ([place]) 09/18/2012  . S/P  hysterectomy 06/20/2012  . H/O colonoscopy with polypectomy 06/20/2012  . Depression 12/30/2011  . Plantar fasciitis 04/22/2011  . HYPERTRIGLYCERIDEMIA 02/21/2008  . DIABETES MELLITUS, TYPE II 02/24/2007  . POLYCYSTIC OVARIAN DISEASE 02/24/2007  . HYPERTENSION 02/24/2007  . Sleep apnea 02/24/2007   Past Medical History:  Diagnosis Date  . Depression   . Diabetes mellitus   . Diabetes mellitus without complication ([place])   . Diverticula of intestine   . High cholesterol   . Hypertension   . OSA on CPAP    CPAP pressure= 15  . Polycystic ovarian syndrome   . Sleep apnea    on CPAP with pressure setting= 15   Past Surgical History:  Procedure Laterality Date  . ABDOMINAL HYSTERECTOMY    . CYST REMOVAL NECK    . KNEE ARTHROSCOPY  07/11/2012   Procedure: ARTHROSCOPY KNEE;  Surgeon: Meredith Pel, MD;  Location: [facility];  Service: Orthopedics;  Laterality: Right;  Right knee arthroscopy with debridement  . KNEE SURGERY    . OVARIAN CYST REMOVAL    . WISDOM TOOTH EXTRACTION  05/11/2017   Allergies  Allergen Reactions  . Morphine And Related Other (See Comments)    Migraines  . Reglan [Metoclopramide] Other (See Comments)    Psychotic episodes  . Toradol [Ketorolac Tromethamine] Rash   Prior to Admission medications   Medication Sig Start Date End Date Taking? Authorizing Provider  atorvastatin (LIPITOR) 10 MG tablet Take 1 tablet (10 mg total) by mouth daily. 11/20/18  Yes Wendie Agreste, MD  blood glucose meter kit and supplies KIT Dispense based on patient and insurance preference. Use up to four times daily as directed. (FOR ICD-9 250.00, 250.01). 12/09/16  Yes Wendie Agreste, MD  dapagliflozin propanediol (FARXIGA) 10 MG TABS tablet Take 10 mg by mouth daily. 11/20/18  Yes Wendie Agreste, MD  DULoxetine (CYMBALTA) 30 MG capsule Take 1 capsule (30 mg total) by mouth daily. 11/20/18  Yes Wendie Agreste, MD  DULoxetine (CYMBALTA) 60 MG capsule Take 1 capsule (60 mg  total) by mouth daily. 11/20/18  Yes Wendie Agreste, MD  hydrochlorothiazide (HYDRODIURIL) 25 MG tablet Take 1 tablet (25 mg total) by mouth daily. 11/20/18  Yes Wendie Agreste, MD  insulin glargine (LANTUS) 100 UNIT/ML injection Inject 1 mL (100 Units total) into the skin daily. 11/20/18  Yes Wendie Agreste, MD  Insulin Syringe-Needle U-100 (TRUEPLUS INSULIN SYRINGE) 30G X 5/16" 0.5 ML MISC USE AS DIRECTED EVERY DAY with insulin pen 11/20/18  Yes Wendie Agreste, MD  lisinopril (PRINIVIL,ZESTRIL) 5 MG tablet Take 1 tablet (5 mg total) by mouth daily. 11/20/18  Yes Wendie Agreste, MD  metFORMIN (GLUCOPHAGE) 1000 MG tablet Take 1 tablet (1,000 mg total) by mouth 2 (two) times daily with a meal. 11/20/18  Yes Wendie Agreste, MD  glucose blood (FREESTYLE LITE) test strip USE AS DIRECTED THREE TO FOUR TIMES DAILY 11/20/18   Wendie Agreste, MD   Social History   Socioeconomic History  . Marital status: Married    Spouse name: Not on file  . Number of children: 1  . Years of education: College  . Highest education level: Not on file  Occupational History  . Not on file  Social Needs  . Financial resource strain: Not on file  . Food insecurity:    Worry: Not on file    Inability: Not on file  . Transportation needs:    Medical: Not on file    Non-medical: Not on file  Tobacco Use  . Smoking status: Former Smoker    Packs/day: 1.00    Types: Cigarettes    Last attempt to quit: 12/18/2016    Years since quitting: 2.1  . Smokeless tobacco: Never Used  . Tobacco comment: stopped smoking on 02/17/17  Substance and Sexual Activity  . Alcohol use: No    Alcohol/week: 0.0 standard drinks    Comment: Rarely.  . Drug use: No  . Sexual activity: Never  Lifestyle  . Physical activity:    Days per week: Not on file    Minutes per session: Not on file  . Stress: Not on file  Relationships  . Social connections:    Talks on phone: Not on file    Gets together: Not on file     Attends religious service: Not on file    Active member of club or organization: Not on file    Attends meetings of clubs or organizations: Not on file    Relationship status: Not on file  . Intimate partner violence:    Fear of current or ex partner: Not on file    Emotionally abused: Not on file    Physically abused: Not on file    Forced sexual activity: Not on file  Other Topics Concern  . Not on file  Social History Narrative   ** Merged History Encounter **   Drinks diet Dr. Malachi Bonds about 1 a day.  Observations/Objective: Speaking normally on phone, no distress.  Assessment and Plan: Type 2 diabetes mellitus with microalbuminuria, with long-term current use of insulin ([facility])  -Overall stable, some persistent slight elevated readings, but not in the 200s as she experienced previously.  Recommended increased activity/exercise and ways to do so during current pandemic.  Continue to watch diet.  Plan on follow-up office visit in approximately 2 months and can check blood work at that time.  Follow Up Instructions:   2 months  Patient Instructions   Good talking to you today.  No change in medications for now.  Increase in exercise by 1 or 2 days/week may improve blood sugars further.  Try look online for some free exercises you can do from home during this time.  Continue to watch diet.  If blood sugars are running in the 200s, or questions please reach out to me, but otherwise we can follow-up in approximately 2 months and do lab work at that time.  Take care.     If you have lab work done today you will be contacted with your lab results within the next 2 weeks.  If you have not heard from [place] then please contact us. The fastest way to get your results is to register for My Chart.   IF you received an x-ray today, you will receive an invoice from [facility]. Please contact [facility] Radiology at [phone] with questions or concerns regarding your invoice.    IF you received labwork today, you will receive an invoice from [place]. Please contact [facility] at [phone] with questions or concerns regarding your invoice.   Our billing staff will not be able to assist you with questions regarding bills from these companies.  You will be contacted with the lab results as soon as they are available. The fastest way to get your results is to activate your My Chart account. Instructions are located on the last page of this paperwork. If you have not heard from [place] regarding the results in 2 weeks, please contact this office.        I discussed the assessment and treatment plan with the patient. The patient was provided an opportunity to ask questions and all were answered. The patient agreed with the plan and demonstrated an understanding of the instructions.   The patient was advised to call back or seek an in-person evaluation if the symptoms worsen or if the condition fails to improve as anticipated.  I provided 8 minutes of non-face-to-face time during this encounter.   Wendie Agreste, MD

## 2019-01-29 NOTE — Progress Notes (Signed)
CC- 6 wk f/u for diabetes- Patient states she is doing fine at this time. She is not having any other issus. She do not need any refill on any medication at this time. Patient was informed to stop by the office in the next few days for her lab work. They are preordered.

## 2019-01-29 NOTE — Patient Instructions (Addendum)
Good talking to you today.  No change in medications for now.  Increase in exercise by 1 or 2 days/week may improve blood sugars further.  Try look online for some free exercises you can do from home during this time.  Continue to watch diet.  If blood sugars are running in the 200s, or questions please reach out to me, but otherwise we can follow-up in approximately 2 months and do lab work at that time.  Take care.     If you have lab work done today you will be contacted with your lab results within the next 2 weeks.  If you have not heard from [place] then please contact us. The fastest way to get your results is to register for My Chart.   IF you received an x-ray today, you will receive an invoice from [facility]. Please contact [facility] at [phone] with questions or concerns regarding your invoice.   IF you received labwork today, you will receive an invoice from [place]. Please contact [facility] at [phone] with questions or concerns regarding your invoice.   Our billing staff will not be able to assist you with questions regarding bills from these companies.  You will be contacted with the lab results as soon as they are available. The fastest way to get your results is to activate your My Chart account. Instructions are located on the last page of this paperwork. If you have not heard from [place] regarding the results in 2 weeks, please contact this office.

## 2019-02-15 MED FILL — LANTUS 100 UNITS/ML VIAL: 100 | 30 days supply | Qty: 30 | Fill #0

## 2019-03-07 MED FILL — ATORVASTATIN 10 MG TABLET: 10 | 90 days supply | Qty: 90 | Fill #1

## 2019-03-07 MED FILL — DULoxetine HCL 30 MG CPEP: 30 | 90 days supply | Qty: 90 | Fill #1

## 2019-03-07 MED FILL — metFORMIN HCL 1000 MG TABS: 1000 | 90 days supply | Qty: 180 | Fill #1

## 2019-03-07 MED FILL — FARXIGA 10 MG TABLET: 10 | 90 days supply | Qty: 90 | Fill #1

## 2019-03-07 MED FILL — LISINOPRIL 5 MG TABLET: 5 | 90 days supply | Qty: 90 | Fill #1

## 2019-03-07 MED FILL — DULoxetine HCL 60 MG CPEP: 60 | 90 days supply | Qty: 90 | Fill #1

## 2019-03-08 ENCOUNTER — Other Ambulatory Visit: Payer: Self-pay | Admitting: Family Medicine

## 2019-03-08 DIAGNOSIS — Z794 Long term (current) use of insulin: Secondary | ICD-10-CM

## 2019-03-08 DIAGNOSIS — E1165 Type 2 diabetes mellitus with hyperglycemia: Secondary | ICD-10-CM

## 2019-03-08 MED FILL — ULTICARE INS SYR 1 ML 30GX1: 30G X 1/2" | 90 days supply | Qty: 100 | Fill #0

## 2019-03-08 NOTE — Telephone Encounter (Signed)
[facility] called and spoke to Dixie, [facility] about the refill request, advised insulin needles were sent on 11/20/18. She says the patient picked up this morning.

## 2019-03-09 ENCOUNTER — Other Ambulatory Visit: Payer: Self-pay

## 2019-03-09 ENCOUNTER — Ambulatory Visit: Payer: Self-pay | Admitting: Family Medicine

## 2019-03-09 ENCOUNTER — Encounter (HOSPITAL_COMMUNITY): Payer: Self-pay

## 2019-03-09 ENCOUNTER — Ambulatory Visit (HOSPITAL_COMMUNITY)
Admission: EM | Admit: 2019-03-09 | Discharge: 2019-03-09 | Disposition: A | Discharge status: Home / Self Care | Payer: No Typology Code available for payment source | Attending: Family Medicine | Admitting: Family Medicine

## 2019-03-09 DIAGNOSIS — N760 Acute vaginitis: Secondary | ICD-10-CM | POA: Insufficient documentation

## 2019-03-09 DIAGNOSIS — R3589 Other polyuria: Secondary | ICD-10-CM

## 2019-03-09 DIAGNOSIS — R739 Hyperglycemia, unspecified: Secondary | ICD-10-CM | POA: Diagnosis present

## 2019-03-09 DIAGNOSIS — R358 Other polyuria: Secondary | ICD-10-CM | POA: Insufficient documentation

## 2019-03-09 DIAGNOSIS — R631 Polydipsia: Secondary | ICD-10-CM | POA: Diagnosis not present

## 2019-03-09 LAB — POCT URINALYSIS DIP (DEVICE)
Bilirubin Urine: NEGATIVE
Glucose, UA: 250 mg/dL — AB
Hgb urine dipstick: NEGATIVE
Ketones, ur: NEGATIVE mg/dL
Leukocytes,Ua: NEGATIVE
Nitrite: NEGATIVE
Protein, ur: NEGATIVE mg/dL
Specific Gravity, Urine: 1.025 (ref 1.005–1.030)
Urobilinogen, UA: 0.2 mg/dL (ref 0.0–1.0)
pH: 5.5 (ref 5.0–8.0)

## 2019-03-09 LAB — GLUCOSE, CAPILLARY: Glucose-Capillary: 205 mg/dL — ABNORMAL HIGH (ref 70–99)

## 2019-03-09 MED ORDER — FLUCONAZOLE 150 MG PO TABS: ORAL_TABLET | ORAL | 0 refills | Status: DC

## 2019-03-09 MED FILL — FLUCONAZOLE 150 MG TABS: 150 | 3 days supply | Qty: 2 | Fill #0

## 2019-03-09 NOTE — Telephone Encounter (Signed)
Pt. Reports BS 401.She is shaky and sweating. Per Gearldine Bienenstock in the practice they do not have openings today. Pt. Will go to ED for evaluation.  Reason for Disposition . Patient sounds very sick or weak to the triager  Answer Assessment - Initial Assessment Questions 1. BLOOD GLUCOSE: "What is your blood glucose level?"      401 2. ONSET: "When did you check the blood glucose?"     0730 3. USUAL RANGE: "What is your glucose level usually?" (e.g., usual fasting morning value, usual evening value)     In the morning under 200 4. KETONES: "Do you check for ketones (urine or blood test strips)?" If yes, ask: "What does the test show now?"      No 5. TYPE 1 or 2:  "Do you know what type of diabetes you have?"  (e.g., Type 1, Type 2, Gestational; doesn't know)      Type 2 6. INSULIN: "Do you take insulin?" "What type of insulin(s) do you use? What is the mode of delivery? (syringe, pen (e.g., injection or  pump)?"      Lantus at bedtime 7. DIABETES PILLS: "Do you take any pills for your diabetes?" If yes, ask: "Have you missed taking any pills recently?"     No 8. OTHER SYMPTOMS: "Do you have any symptoms?" (e.g., fever, frequent urination, difficulty breathing, dizziness, weakness, vomiting)     Shaky, sweaty 9. PREGNANCY: "Is there any chance you are pregnant?" "When was your last menstrual period?"     No  Protocols used: DIABETES - HIGH BLOOD SUGAR-A-AH

## 2019-03-09 NOTE — ED Triage Notes (Signed)
Pt presents with elevated blood sugar and urinary tract symptoms; burning & discomfort with urination X 2 days.

## 2019-03-09 NOTE — Telephone Encounter (Signed)
Noted Pt going to ED

## 2019-03-09 NOTE — ED Provider Notes (Signed)
[facility]    CSN: [id number] Arrival date & time: 03/09/19  1158     History   Chief Complaint Chief Complaint  Patient presents with  . Urinary Tract Infection  . Elevated Blood Sugar    HPI Renee Ho is a 43 y.o. female.   Renee Ho presents with complaints of urinary symptoms as well as elevation in her blood sugar. Noticed these started approximately 4 days ago. Her fasting blood sugar as been up to 418, noted this morning. She hasn't eaten at all today since then. Has had odor to her urine as well as burning with urination and frequency in urination. No fevers. Has had normal diet. Increased thirst. Has felt sweaty at night. Denies any previous similar. States she has had vaginal irritation/itching. No vaginal discharge. No change to her diabetic medications. No other URI symptoms, abdominal symptoms. No new back pain. No pelvic pain. Denies any previous similar. Hx of dm, htn, pcos, depression.     ROS per HPI, negative if not otherwise mentioned.      Past Medical History:  Diagnosis Date  . Depression   . Diabetes mellitus   . Diabetes mellitus without complication ([place])   . Diverticula of intestine   . High cholesterol   . Hypertension   . OSA on CPAP    CPAP pressure= 15  . Polycystic ovarian syndrome   . Sleep apnea    on CPAP with pressure setting= 15    Patient Active Problem List   Diagnosis Date Noted  . Hyperglycemia 12/22/2016  . Injury of foot, left 07/18/2013  . HTN (hypertension) 09/18/2012  . Morbid obesity ([place]) 09/18/2012  . S/P hysterectomy 06/20/2012  . H/O colonoscopy with polypectomy 06/20/2012  . Depression 12/30/2011  . Plantar fasciitis 04/22/2011  . HYPERTRIGLYCERIDEMIA 02/21/2008  . DIABETES MELLITUS, TYPE II 02/24/2007  . POLYCYSTIC OVARIAN DISEASE 02/24/2007  . HYPERTENSION 02/24/2007  . Sleep apnea 02/24/2007    Past Surgical History:  Procedure Laterality Date  . ABDOMINAL  HYSTERECTOMY    . CYST REMOVAL NECK    . KNEE ARTHROSCOPY  07/11/2012   Procedure: ARTHROSCOPY KNEE;  Surgeon: Meredith Pel, MD;  Location: [facility];  Service: Orthopedics;  Laterality: Right;  Right knee arthroscopy with debridement  . KNEE SURGERY    . OVARIAN CYST REMOVAL    . WISDOM TOOTH EXTRACTION  05/11/2017    OB History    Gravida  0   Para  0   Term  0   Preterm  0   AB  0   Living        SAB  0   TAB  0   Ectopic  0   Multiple      Live Births               Home Medications    Prior to Admission medications   Medication Sig Start Date End Date Taking? Authorizing Provider  atorvastatin (LIPITOR) 10 MG tablet Take 1 tablet (10 mg total) by mouth daily. 11/20/18   Wendie Agreste, MD  blood glucose meter kit and supplies KIT Dispense based on patient and insurance preference. Use up to four times daily as directed. (FOR ICD-9 250.00, 250.01). 12/09/16   Wendie Agreste, MD  dapagliflozin propanediol (FARXIGA) 10 MG TABS tablet Take 10 mg by mouth daily. 11/20/18   Wendie Agreste, MD  DULoxetine (CYMBALTA) 30 MG capsule Take 1 capsule (30 mg total) by  mouth daily. 11/20/18   Wendie Agreste, MD  DULoxetine (CYMBALTA) 60 MG capsule Take 1 capsule (60 mg total) by mouth daily. 11/20/18   Wendie Agreste, MD  fluconazole (DIFLUCAN) 150 MG tablet Take 1 tablet today. If still with symptoms may repeat in 3 days. 03/09/19   Augusto Gamble B, NP  glucose blood (FREESTYLE LITE) test strip USE AS DIRECTED THREE TO FOUR TIMES DAILY 11/20/18   Wendie Agreste, MD  hydrochlorothiazide (HYDRODIURIL) 25 MG tablet Take 1 tablet (25 mg total) by mouth daily. 11/20/18   Wendie Agreste, MD  insulin glargine (LANTUS) 100 UNIT/ML injection Inject 1 mL (100 Units total) into the skin daily. 11/20/18   Wendie Agreste, MD  Insulin Syringe-Needle U-100 (TRUEPLUS INSULIN SYRINGE) 30G X 5/16" 0.5 ML MISC USE AS DIRECTED EVERY DAY with insulin pen 11/20/18   Wendie Agreste, MD  lisinopril (PRINIVIL,ZESTRIL) 5 MG tablet Take 1 tablet (5 mg total) by mouth daily. 11/20/18   Wendie Agreste, MD  metFORMIN (GLUCOPHAGE) 1000 MG tablet Take 1 tablet (1,000 mg total) by mouth 2 (two) times daily with a meal. 11/20/18   Wendie Agreste, MD    Family History Family History  Problem Relation Age of Onset  . Cancer Mother   . Mental illness Mother   . Cancer Maternal Grandfather   . Stroke Paternal Grandmother     Social History Social History   Tobacco Use  . Smoking status: Former Smoker    Packs/day: 1.00    Types: Cigarettes    Last attempt to quit: 12/18/2016    Years since quitting: 2.2  . Smokeless tobacco: Never Used  . Tobacco comment: stopped smoking on 02/17/17  Substance Use Topics  . Alcohol use: No    Alcohol/week: 0.0 standard drinks    Comment: Rarely.  . Drug use: No     Allergies   Morphine and related; Reglan [metoclopramide]; and Toradol [ketorolac tromethamine]   Review of Systems Review of Systems   Physical Exam Triage Vital Signs ED Triage Vitals  Enc Vitals Group     BP 03/09/19 1221 116/80     Pulse Rate 03/09/19 1221 96     Resp 03/09/19 1221 17     Temp 03/09/19 1221 99.1 F (37.3 C)     Temp Source 03/09/19 1221 Oral     SpO2 03/09/19 1221 96 %     Weight --      Height --      Head Circumference --      Peak Flow --      Pain Score 03/09/19 1223 7     Pain Loc --      Pain Edu? --      Excl. in [place]? --    No data found.  Updated Vital Signs BP 116/80 (BP Location: Right Arm)   Pulse 96   Temp 99.1 F (37.3 C) (Oral)   Resp 17   SpO2 96%    Physical Exam Constitutional:      General: She is not in acute distress.    Appearance: She is well-developed.  Cardiovascular:     Rate and Rhythm: Normal rate and regular rhythm.     Heart sounds: Normal heart sounds.  Pulmonary:     Effort: Pulmonary effort is normal.     Breath sounds: Normal breath sounds.  Abdominal:     Palpations: Abdomen  is soft. Abdomen is not rigid.  Tenderness: There is no abdominal tenderness. There is no guarding or rebound.  Genitourinary:    Comments: Denies sores, lesions, vaginal bleeding; no pelvic pain; gu exam deferred at this time, vaginal self swab collected.   Skin:    General: Skin is warm and dry.  Neurological:     Mental Status: She is alert and oriented to person, place, and time.      UC Treatments / Results  Labs (all labs ordered are listed, but only abnormal results are displayed) Labs Reviewed  GLUCOSE, CAPILLARY - Abnormal; Notable for the following components:      Result Value   Glucose-Capillary 205 (*)    All other components within normal limits  POCT URINALYSIS DIP (DEVICE) - Abnormal; Notable for the following components:   Glucose, UA 250 (*)    All other components within normal limits  URINE CULTURE  CBG MONITORING, ED  CERVICOVAGINAL ANCILLARY ONLY    EKG None  Radiology No results found.  Procedures Procedures (including critical care time)  Medications Ordered in UC Medications - No data to display  Initial Impression / Assessment and Plan / UC Course  I have reviewed the triage vital signs and the nursing notes.  Pertinent labs & imaging results that were available during my care of the patient were reviewed by me and considered in my medical decision making (see chart for details).     Afebrile. BS has improved and is now in 200's. UA with glucose but otherwise normal. No leuks, no nitrite, no ketones. Urine culture pending. Will notify of any positive findings and if any changes to treatment are needed.  Vaginal self swab collected and pending, sounds likely yeast vulvovaginitis as well with diflucan provided. Discussed diet at length. Encouraged close monitoring of blood sugars with recheck with PCP early next week as may need medication adjustments. Patient verbalized understanding and agreeable to plan.     Final Clinical Impressions(s)  / UC Diagnoses   Final diagnoses:  Elevated blood sugar  Polydipsia  Polyuria  Vaginitis and vulvovaginitis     Discharge Instructions     Your urine looks well today- no ketones and no indications of infection.  Your blood sugar has improved since this morning. Continue with your medications as prescribed, monitor your sugars regularly and keep log with diet to provide to your physician.  We will treat for likely yeast vaginitis with diflucan. May use over the counter AZO as needed as well.  Drink plenty of water, increase lean protein in the diet.  Please follow up with your primary care provider for recheck as may need your medications adjusted.  For any worsening or persistent elevation in your sugar please return or go to the ER.    ED Prescriptions    Medication Sig Dispense Auth. Provider   fluconazole (DIFLUCAN) 150 MG tablet Take 1 tablet today. If still with symptoms may repeat in 3 days. 2 tablet Zigmund Gottron, NP     Controlled Substance Prescriptions [place] Controlled Substance Registry consulted? Not Applicable   Zigmund Gottron, NP 03/09/19 1345

## 2019-03-09 NOTE — Discharge Instructions (Signed)
Your urine looks well today- no ketones and no indications of infection.  Your blood sugar has improved since this morning. Continue with your medications as prescribed, monitor your sugars regularly and keep log with diet to provide to your physician.  We will treat for likely yeast vaginitis with diflucan. May use over the counter AZO as needed as well.  Drink plenty of water, increase lean protein in the diet.  Please follow up with your primary care provider for recheck as may need your medications adjusted.  For any worsening or persistent elevation in your sugar please return or go to the ER.

## 2019-03-10 LAB — URINE CULTURE: Culture: NO GROWTH

## 2019-03-12 LAB — CERVICOVAGINAL ANCILLARY ONLY
Bacterial vaginitis: POSITIVE — AB
Candida vaginitis: POSITIVE — AB
Chlamydia: NEGATIVE
Neisseria Gonorrhea: NEGATIVE
Trichomonas: NEGATIVE

## 2019-03-13 ENCOUNTER — Other Ambulatory Visit: Payer: Self-pay

## 2019-03-13 ENCOUNTER — Encounter: Payer: Self-pay | Admitting: Family Medicine

## 2019-03-13 ENCOUNTER — Ambulatory Visit (INDEPENDENT_AMBULATORY_CARE_PROVIDER_SITE_OTHER): Payer: No Typology Code available for payment source | Admitting: Family Medicine

## 2019-03-13 VITALS — BP 132/81 | HR 88 | Temp 98.8°F | Resp 14 | Wt 261.6 lb

## 2019-03-13 DIAGNOSIS — R5383 Other fatigue: Secondary | ICD-10-CM

## 2019-03-13 DIAGNOSIS — R110 Nausea: Secondary | ICD-10-CM | POA: Diagnosis not present

## 2019-03-13 DIAGNOSIS — R739 Hyperglycemia, unspecified: Secondary | ICD-10-CM | POA: Diagnosis not present

## 2019-03-13 DIAGNOSIS — R809 Proteinuria, unspecified: Secondary | ICD-10-CM

## 2019-03-13 DIAGNOSIS — E1129 Type 2 diabetes mellitus with other diabetic kidney complication: Secondary | ICD-10-CM

## 2019-03-13 DIAGNOSIS — Z794 Long term (current) use of insulin: Secondary | ICD-10-CM | POA: Diagnosis not present

## 2019-03-13 LAB — POCT CBC
Granulocyte percent: 66.8 %G (ref 37–80)
HCT, POC: 49.9 % — AB (ref 29–41)
Hemoglobin: 17.5 g/dL — AB (ref 11–14.6)
Lymph, poc: 3.3 (ref 0.6–3.4)
MCH, POC: 30.6 pg (ref 27–31.2)
MCHC: 34.5 g/dL (ref 31.8–35.4)
MCV: 88.8 fL (ref 76–111)
MID (cbc): 0.7 (ref 0–0.9)
MPV: 10.4 fL (ref 0–99.8)
POC Granulocyte: 8.1 — AB (ref 2–6.9)
POC LYMPH PERCENT: 27.1 %L (ref 10–50)
POC MID %: 6.1 %M (ref 0–12)
Platelet Count, POC: 194 10*3/uL (ref 142–424)
RBC: 5.63 M/uL — AB (ref 4.04–5.48)
RDW, POC: 14.2 %
WBC: 12.1 10*3/uL — AB (ref 4.6–10.2)

## 2019-03-13 LAB — POCT URINALYSIS DIP (MANUAL ENTRY)
Bilirubin, UA: NEGATIVE
Glucose, UA: 500 mg/dL — AB
Ketones, POC UA: NEGATIVE mg/dL
Leukocytes, UA: NEGATIVE
Nitrite, UA: NEGATIVE
Protein Ur, POC: NEGATIVE mg/dL
Spec Grav, UA: 1.015 (ref 1.010–1.025)
Urobilinogen, UA: 0.2 E.U./dL
pH, UA: 5 (ref 5.0–8.0)

## 2019-03-13 LAB — POCT GLYCOSYLATED HEMOGLOBIN (HGB A1C): Hemoglobin A1C: 10.4 % — AB (ref 4.0–5.6)

## 2019-03-13 LAB — GLUCOSE, POCT (MANUAL RESULT ENTRY): POC Glucose: 168 mg/dl — AB (ref 70–99)

## 2019-03-13 NOTE — Progress Notes (Signed)
Subjective:    Patient ID: Renee Ho, female    DOB: Mar 21, 1976, 43 y.o.   MRN: [id number]  HPI Renee Ho is a 43 y.o. female Presents today for: Chief Complaint  Patient presents with  . Diabetes    Have been having elevated blood sugar for a week now. Friday 03/09/19 bs was 418 and on monday bs was353. Went to the urgent care on 03/09/19. Other symptoms are fatigue , sweats, nausea and dry heaving.    Diabetes: History of insulin-dependent diabetes mellitus, last visit with me by telemedicine April 20.  Was doing well at that time with home readings remain below 200, usually around 1 7180 with a level of 130 and high of 196.  She was continue to use Lantus 100 units/day at that time, fark [employer] 10 mg daily, metformin 1000 mg twice daily.  Planned increased activity/exercise, continued adherence to diet and recheck in 2 months with planned A1c   Recently has had some issues with glycemic control.  Phone note from May 29, blood sugar 401, shaky and sweaty.  Plan for ED evaluation. Seen at [facility] urgent care.  Glucose 205 at that time.  Was seen for urinary symptoms with odor in the urine as well as burning with urination and frequency no fevers.  Also with vaginal irritation/itching without apparent discharge.  Wet prep positive for BV and Candida.  Urinalysis without nitrite or leukocyte esterase, urine culture no growth.  Treated with Diflucan 150 mg x 1 with option to repeat in 3 days.  Has been doing well since last discussed in April with home blood sugars remain below 200, up until last week  8 days ago noticed some fatigue with some sweating when she was at work some dry heaves and nausea blood sugar was 353.  Went home and blood sugars were improved the remainder that week in the 200s  5 days ago had sweats that returned with a reading of 418 which prompted her to go to urgent care as above.  She has taken Diflucan once, Azo over-the-counter with resolution of  urinary symptoms and vaginal symptoms.  Episode of sweats 2 days ago but denies recent sweating.  No current nausea/vomiting or abdominal pain, denies any chest pain or shortness of breath, no fever during the course of this illness.  No change in taste or smell, no cough.  Blood sugars since urgent care visit has been lowest at 137, 158, otherwise in the low 200s.  Has not returned into the 300 range.  Did have pasta last night with a blood sugar of 250 this morning.    Microalbumin: Optho, foot exam, pneumovax: Up-to-date  Lab Results  Component Value Date   HGBA1C 8.4 (A) 11/20/2018   HGBA1C 9.6 (A) 08/23/2018   HGBA1C 6.9 11/22/2017   Lab Results  Component Value Date   MICROALBUR 0.3 09/22/2015   LDLCALC 94 08/23/2018   CREATININE 0.72 08/23/2018       Patient Active Problem List   Diagnosis Date Noted  . Hyperglycemia 12/22/2016  . Injury of foot, left 07/18/2013  . HTN (hypertension) 09/18/2012  . Morbid obesity ([place]) 09/18/2012  . S/P hysterectomy 06/20/2012  . H/O colonoscopy with polypectomy 06/20/2012  . Depression 12/30/2011  . Plantar fasciitis 04/22/2011  . HYPERTRIGLYCERIDEMIA 02/21/2008  . DIABETES MELLITUS, TYPE II 02/24/2007  . POLYCYSTIC OVARIAN DISEASE 02/24/2007  . HYPERTENSION 02/24/2007  . Sleep apnea 02/24/2007   Past Medical History:  Diagnosis Date  .  Depression   . Diabetes mellitus   . Diabetes mellitus without complication ([place])   . Diverticula of intestine   . High cholesterol   . Hypertension   . OSA on CPAP    CPAP pressure= 15  . Polycystic ovarian syndrome   . Sleep apnea    on CPAP with pressure setting= 15   Past Surgical History:  Procedure Laterality Date  . ABDOMINAL HYSTERECTOMY    . CYST REMOVAL NECK    . KNEE ARTHROSCOPY  07/11/2012   Procedure: ARTHROSCOPY KNEE;  Surgeon: Meredith Pel, MD;  Location: [facility];  Service: Orthopedics;  Laterality: Right;  Right knee arthroscopy with debridement  . KNEE SURGERY    .  OVARIAN CYST REMOVAL    . WISDOM TOOTH EXTRACTION  05/11/2017   Allergies  Allergen Reactions  . Morphine And Related Other (See Comments)    Migraines  . Reglan [Metoclopramide] Other (See Comments)    Psychotic episodes  . Toradol [Ketorolac Tromethamine] Rash   Prior to Admission medications   Medication Sig Start Date End Date Taking? Authorizing Provider  atorvastatin (LIPITOR) 10 MG tablet Take 1 tablet (10 mg total) by mouth daily. 11/20/18  Yes Wendie Agreste, MD  blood glucose meter kit and supplies KIT Dispense based on patient and insurance preference. Use up to four times daily as directed. (FOR ICD-9 250.00, 250.01). 12/09/16  Yes Wendie Agreste, MD  dapagliflozin propanediol (FARXIGA) 10 MG TABS tablet Take 10 mg by mouth daily. 11/20/18  Yes Wendie Agreste, MD  DULoxetine (CYMBALTA) 30 MG capsule Take 1 capsule (30 mg total) by mouth daily. 11/20/18  Yes Wendie Agreste, MD  DULoxetine (CYMBALTA) 60 MG capsule Take 1 capsule (60 mg total) by mouth daily. 11/20/18  Yes Wendie Agreste, MD  glucose blood (FREESTYLE LITE) test strip USE AS DIRECTED THREE TO FOUR TIMES DAILY 11/20/18  Yes Wendie Agreste, MD  hydrochlorothiazide (HYDRODIURIL) 25 MG tablet Take 1 tablet (25 mg total) by mouth daily. 11/20/18  Yes Wendie Agreste, MD  insulin glargine (LANTUS) 100 UNIT/ML injection Inject 1 mL (100 Units total) into the skin daily. 11/20/18  Yes Wendie Agreste, MD  Insulin Syringe-Needle U-100 (TRUEPLUS INSULIN SYRINGE) 30G X 5/16" 0.5 ML MISC USE AS DIRECTED EVERY DAY with insulin pen 11/20/18  Yes Wendie Agreste, MD  lisinopril (PRINIVIL,ZESTRIL) 5 MG tablet Take 1 tablet (5 mg total) by mouth daily. 11/20/18  Yes Wendie Agreste, MD  metFORMIN (GLUCOPHAGE) 1000 MG tablet Take 1 tablet (1,000 mg total) by mouth 2 (two) times daily with a meal. 11/20/18  Yes Wendie Agreste, MD   Social History   Socioeconomic History  . Marital status: Married    Spouse name:  Not on file  . Number of children: 1  . Years of education: College  . Highest education level: Not on file  Occupational History  . Not on file  Social Needs  . Financial resource strain: Not on file  . Food insecurity:    Worry: Not on file    Inability: Not on file  . Transportation needs:    Medical: Not on file    Non-medical: Not on file  Tobacco Use  . Smoking status: Former Smoker    Packs/day: 1.00    Types: Cigarettes    Last attempt to quit: 12/18/2016    Years since quitting: 2.2  . Smokeless tobacco: Never Used  . Tobacco comment: stopped smoking on  02/17/17  Substance and Sexual Activity  . Alcohol use: No    Alcohol/week: 0.0 standard drinks    Comment: Rarely.  . Drug use: No  . Sexual activity: Never  Lifestyle  . Physical activity:    Days per week: Not on file    Minutes per session: Not on file  . Stress: Not on file  Relationships  . Social connections:    Talks on phone: Not on file    Gets together: Not on file    Attends religious service: Not on file    Active member of club or organization: Not on file    Attends meetings of clubs or organizations: Not on file    Relationship status: Not on file  . Intimate partner violence:    Fear of current or ex partner: Not on file    Emotionally abused: Not on file    Physically abused: Not on file    Forced sexual activity: Not on file  Other Topics Concern  . Not on file  Social History Narrative   ** Merged History Encounter **   Drinks diet Dr. Malachi Bonds about 1 a day.         Review of Systems As above in HPI.     Objective:   Physical Exam Vitals signs reviewed.  Constitutional:      General: She is not in acute distress.    Appearance: She is well-developed. She is obese. She is not ill-appearing, toxic-appearing or diaphoretic.  HENT:     Head: Normocephalic and atraumatic.  Eyes:     Conjunctiva/sclera: Conjunctivae normal.     Pupils: Pupils are equal, round, and reactive to  light.  Neck:     Vascular: No carotid bruit.  Cardiovascular:     Rate and Rhythm: Normal rate and regular rhythm.     Heart sounds: Normal heart sounds. No murmur. No friction rub.  Pulmonary:     Effort: Pulmonary effort is normal.     Breath sounds: Normal breath sounds.  Abdominal:     General: There is no distension.     Palpations: Abdomen is soft. There is no pulsatile mass.     Tenderness: There is no abdominal tenderness. There is no guarding or rebound.  Skin:    General: Skin is warm and dry.  Neurological:     Mental Status: She is alert and oriented to person, place, and time.  Psychiatric:        Behavior: Behavior normal.    Vitals:   03/13/19 1321  BP: 132/81  Pulse: 88  Resp: 14  Temp: 98.8 F (37.1 C)  TempSrc: Oral  SpO2: 96%  Weight: 261 lb 9.6 oz (118.7 kg)     Results for orders placed or performed in visit on 03/13/19  POCT glycosylated hemoglobin (Hb A1C)  Result Value Ref Range   Hemoglobin A1C 10.4 (A) 4.0 - 5.6 %   HbA1c POC (<> result, manual entry)     HbA1c, POC (prediabetic range)     HbA1c, POC (controlled diabetic range)    POCT glucose (manual entry)  Result Value Ref Range   POC Glucose 168 (A) 70 - 99 mg/dl   EKG: Sinus rhythm, low voltage, no apparent acute ST/T wave changes Results for orders placed or performed in visit on 03/13/19  POCT glycosylated hemoglobin (Hb A1C)  Result Value Ref Range   Hemoglobin A1C 10.4 (A) 4.0 - 5.6 %   HbA1c POC (<> result, manual entry)  HbA1c, POC (prediabetic range)     HbA1c, POC (controlled diabetic range)    POCT glucose (manual entry)  Result Value Ref Range   POC Glucose 168 (A) 70 - 99 mg/dl  POCT CBC  Result Value Ref Range   WBC 12.1 (A) 4.6 - 10.2 K/uL   Lymph, poc 3.3 0.6 - 3.4   POC LYMPH PERCENT 27.1 10 - 50 %L   MID (cbc) 0.7 0 - 0.9   POC MID % 6.1 0 - 12 %M   POC Granulocyte 8.1 (A) 2 - 6.9   Granulocyte percent 66.8 37 - 80 %G   RBC 5.63 (A) 4.04 - 5.48 M/uL    Hemoglobin 17.5 (A) 11 - 14.6 g/dL   HCT, POC 49.9 (A) 29 - 41 %   MCV 88.8 76 - 111 fL   MCH, POC 30.6 27 - 31.2 pg   MCHC 34.5 31.8 - 35.4 g/dL   RDW, POC 14.2 %   Platelet Count, POC 194 142 - 424 K/uL   MPV 10.4 0 - 99.8 fL  POCT urinalysis dipstick  Result Value Ref Range   Color, UA yellow yellow   Clarity, UA clear clear   Glucose, UA =500 (A) negative mg/dL   Bilirubin, UA negative negative   Ketones, POC UA negative negative mg/dL   Spec Grav, UA 1.015 1.010 - 1.025   Blood, UA trace-intact (A) negative   pH, UA 5.0 5.0 - 8.0   Protein Ur, POC negative negative mg/dL   Urobilinogen, UA 0.2 0.2 or 1.0 E.U./dL   Nitrite, UA Negative Negative   Leukocytes, UA Negative Negative       Assessment & Plan:  Renee Ho is a 43 y.o. female Type 2 diabetes mellitus with microalbuminuria, with long-term current use of insulin ([place]) - Plan: POCT glycosylated hemoglobin (Hb A1C), POCT glucose (manual entry), Comprehensive metabolic panel, Lipid panel, EKG 12-Lead, POCT CBC, POCT urinalysis dipstick  Hyperglycemia - Plan: Comprehensive metabolic panel, EKG 71-IWPY  Nausea without vomiting - Plan: Comprehensive metabolic panel, EKG 09-XIPJ, Lipase, POCT CBC, POCT urinalysis dipstick  Fatigue, unspecified type - Plan: EKG 12-Lead, POCT CBC, POCT urinalysis dipstick  Prior symptoms of hyperglycemia may have been related to infection, that could have been improving by the time she had testing done at urgent care.  Less likely candidal infection or BV contributing to significant hyperglycemia.  Now with improving symptoms and improved glucose reading in office, no ketones on urinalysis, CMP pending  -Denies missed doses of medications.    -Denies chest pain, no acute findings on EKG, unlikely cardiac event.  No abdominal pain, nausea and dry heaves have resolved, less likely pancreatitis but will check lipase.  -She is on SGLT2, and that could be possible association with DKA   but with other urinary symptoms/possible infectious symptoms as above, will hold on changes for now.    -Continue same regimen for now with close monitoring of readings, follow-up within 2 weeks but RTC/ER precautions if recurrence of prior nausea/sweating, significant hyperglycemia.   No orders of the defined types were placed in this encounter.  Patient Instructions    It is difficult to say what caused the elevated readings last week, but possible infection as a contributor. I will check for some other causes..  As symptoms are improved now, and blood sugar is better in the office today, I would not recommend any changes in medicines at this time.  Be careful with diet over the next week  or 2, continue to monitor blood sugars for significant elevated readings as previously.  If any return of nausea, sweating, abdominal pain or other new/worsening symptoms, be seen right away.  Otherwise I will follow-up with you in 2 weeks, by telemedicine if needed.   If you have lab work done today you will be contacted with your lab results within the next 2 weeks.  If you have not heard from [place] then please contact us. The fastest way to get your results is to register for My Chart.   IF you received an x-ray today, you will receive an invoice from [facility]. Please contact [facility] at [phone] with questions or concerns regarding your invoice.   IF you received labwork today, you will receive an invoice from [place]. Please contact [facility] at [phone] with questions or concerns regarding your invoice.   Our billing staff will not be able to assist you with questions regarding bills from these companies.  You will be contacted with the lab results as soon as they are available. The fastest way to get your results is to activate your My Chart account. Instructions are located on the last page of this paperwork. If you have not heard from [place] regarding the results in 2 weeks,  please contact this office.       Signed,   [name], MD [facility] [place].  03/13/19 6:46 PM

## 2019-03-13 NOTE — Patient Instructions (Addendum)
  It is difficult to say what caused the elevated readings last week, but possible infection as a contributor. I will check for some other causes..  As symptoms are improved now, and blood sugar is better in the office today, I would not recommend any changes in medicines at this time.  Be careful with diet over the next week or 2, continue to monitor blood sugars for significant elevated readings as previously.  If any return of nausea, sweating, abdominal pain or other new/worsening symptoms, be seen right away.  Otherwise I will follow-up with you in 2 weeks, by telemedicine if needed.   If you have lab work done today you will be contacted with your lab results within the next 2 weeks.  If you have not heard from [place] then please contact us. The fastest way to get your results is to register for My Chart.   IF you received an x-ray today, you will receive an invoice from [facility]. Please contact [facility] at [phone] with questions or concerns regarding your invoice.   IF you received labwork today, you will receive an invoice from [place]. Please contact [facility] at [phone] with questions or concerns regarding your invoice.   Our billing staff will not be able to assist you with questions regarding bills from these companies.  You will be contacted with the lab results as soon as they are available. The fastest way to get your results is to activate your My Chart account. Instructions are located on the last page of this paperwork. If you have not heard from [place] regarding the results in 2 weeks, please contact this office.

## 2019-03-14 LAB — COMPREHENSIVE METABOLIC PANEL
ALT: 12 IU/L (ref 0–32)
AST: 16 IU/L (ref 0–40)
Albumin/Globulin Ratio: 1.6 (ref 1.2–2.2)
Albumin: 4.5 g/dL (ref 3.8–4.8)
Alkaline Phosphatase: 59 IU/L (ref 39–117)
BUN/Creatinine Ratio: 15 (ref 9–23)
BUN: 12 mg/dL (ref 6–24)
Bilirubin Total: 0.4 mg/dL (ref 0.0–1.2)
CO2: 24 mmol/L (ref 20–29)
Calcium: 9.7 mg/dL (ref 8.7–10.2)
Chloride: 97 mmol/L (ref 96–106)
Creatinine, Ser: 0.79 mg/dL (ref 0.57–1.00)
GFR calc Af Amer: 105 mL/min/{1.73_m2} (ref >59)
GFR calc non Af Amer: 91 mL/min/{1.73_m2} (ref >59)
Globulin, Total: 2.9 g/dL (ref 1.5–4.5)
Glucose: 183 mg/dL — ABNORMAL HIGH (ref 65–99)
Potassium: 4.3 mmol/L (ref 3.5–5.2)
Sodium: 140 mmol/L (ref 134–144)
Total Protein: 7.4 g/dL (ref 6.0–8.5)

## 2019-03-14 LAB — LIPID PANEL
Chol/HDL Ratio: 7.9 ratio — ABNORMAL HIGH (ref 0.0–4.4)
Cholesterol, Total: 213 mg/dL — ABNORMAL HIGH (ref 100–199)
HDL: 27 mg/dL — ABNORMAL LOW (ref >39)
Triglycerides: 581 mg/dL (ref 0–149)

## 2019-03-14 LAB — LIPASE: Lipase: 54 U/L (ref 14–72)

## 2019-03-19 ENCOUNTER — Telehealth (HOSPITAL_COMMUNITY): Payer: Self-pay | Admitting: Emergency Medicine

## 2019-03-19 NOTE — Telephone Encounter (Signed)
Per natalie, no need to call patietn again if she does not respond to voicemail. Pt treated for yeast, saw PCP last week and was not given medicines. Per Lanelle Bal NP, only prescribe flagyl if pt calls back and c/o of symptoms. Otherwise no treatment.   Attempted to reach patient. No answer at this time. Voicemail left.

## 2019-03-21 ENCOUNTER — Ambulatory Visit: Payer: No Typology Code available for payment source | Admitting: Family Medicine

## 2019-04-03 ENCOUNTER — Ambulatory Visit: Payer: No Typology Code available for payment source | Admitting: Family Medicine

## 2019-04-09 MED FILL — LANTUS 100 UNITS/ML VIAL: 100 | 90 days supply | Qty: 90 | Fill #0

## 2019-05-14 MED FILL — HYDROCHLOROTHIAZIDE 25 MG T: 25 | 90 days supply | Qty: 90 | Fill #0

## 2019-06-22 ENCOUNTER — Other Ambulatory Visit: Payer: Self-pay

## 2019-06-22 ENCOUNTER — Emergency Department (HOSPITAL_COMMUNITY): Payer: PRIVATE HEALTH INSURANCE

## 2019-06-22 ENCOUNTER — Emergency Department (HOSPITAL_COMMUNITY): Payer: Self-pay

## 2019-06-22 ENCOUNTER — Encounter (HOSPITAL_COMMUNITY): Payer: Self-pay | Admitting: Emergency Medicine

## 2019-06-22 ENCOUNTER — Encounter (HOSPITAL_COMMUNITY): Payer: Self-pay

## 2019-06-22 ENCOUNTER — Emergency Department (HOSPITAL_COMMUNITY)
Admission: EM | Admit: 2019-06-22 | Discharge: 2019-06-22 | Disposition: A | Discharge status: Home / Self Care | Payer: PRIVATE HEALTH INSURANCE | Attending: Emergency Medicine | Admitting: Emergency Medicine

## 2019-06-22 ENCOUNTER — Emergency Department (HOSPITAL_COMMUNITY)
Admission: EM | Admit: 2019-06-22 | Discharge: 2019-06-22 | Discharge status: Against Medical Advice | Payer: PRIVATE HEALTH INSURANCE | Attending: Emergency Medicine | Admitting: Emergency Medicine

## 2019-06-22 DIAGNOSIS — Z5321 Procedure and treatment not carried out due to patient leaving prior to being seen by health care provider: Secondary | ICD-10-CM | POA: Diagnosis not present

## 2019-06-22 DIAGNOSIS — M5441 Lumbago with sciatica, right side: Secondary | ICD-10-CM | POA: Diagnosis not present

## 2019-06-22 DIAGNOSIS — Y9289 Other specified places as the place of occurrence of the external cause: Secondary | ICD-10-CM | POA: Insufficient documentation

## 2019-06-22 DIAGNOSIS — Y990 Civilian activity done for income or pay: Secondary | ICD-10-CM | POA: Diagnosis not present

## 2019-06-22 DIAGNOSIS — Z79899 Other long term (current) drug therapy: Secondary | ICD-10-CM | POA: Diagnosis not present

## 2019-06-22 DIAGNOSIS — I10 Essential (primary) hypertension: Secondary | ICD-10-CM | POA: Insufficient documentation

## 2019-06-22 DIAGNOSIS — Z87891 Personal history of nicotine dependence: Secondary | ICD-10-CM | POA: Diagnosis not present

## 2019-06-22 DIAGNOSIS — X500XXA Overexertion from strenuous movement or load, initial encounter: Secondary | ICD-10-CM | POA: Diagnosis not present

## 2019-06-22 DIAGNOSIS — Z794 Long term (current) use of insulin: Secondary | ICD-10-CM | POA: Insufficient documentation

## 2019-06-22 DIAGNOSIS — M545 Low back pain: Secondary | ICD-10-CM | POA: Diagnosis present

## 2019-06-22 DIAGNOSIS — Y9389 Activity, other specified: Secondary | ICD-10-CM | POA: Diagnosis not present

## 2019-06-22 DIAGNOSIS — E119 Type 2 diabetes mellitus without complications: Secondary | ICD-10-CM | POA: Insufficient documentation

## 2019-06-22 MED ORDER — OXYCODONE-ACETAMINOPHEN 5-325 MG PO TABS
2.0000 | ORAL_TABLET | Freq: Once | ORAL | Status: AC
  Administered 2019-06-22: 15:00:00 2 via ORAL
  Filled 2019-06-22: qty 2

## 2019-06-22 MED ORDER — LIDOCAINE 5 % EX PTCH
1.0000 | MEDICATED_PATCH | CUTANEOUS | Status: DC
  Administered 2019-06-22: 1 via TRANSDERMAL
  Filled 2019-06-22: qty 1

## 2019-06-22 MED ORDER — HYDROMORPHONE HCL 1 MG/ML IJ SOLN
1.0000 mg | Freq: Once | INTRAMUSCULAR | Status: AC
  Administered 2019-06-22: 1 mg via INTRAMUSCULAR
  Filled 2019-06-22: qty 1

## 2019-06-22 MED ORDER — PREDNISONE 20 MG PO TABS: 40.0000 mg | ORAL_TABLET | Freq: Every day | ORAL | 0 refills | Status: DC

## 2019-06-22 MED ORDER — ONDANSETRON 8 MG PO TBDP
8.0000 mg | ORAL_TABLET | Freq: Once | ORAL | Status: AC
  Administered 2019-06-22: 8 mg via ORAL
  Filled 2019-06-22: qty 1

## 2019-06-22 MED ORDER — ONDANSETRON 4 MG PO TBDP
4.0000 mg | ORAL_TABLET | Freq: Once | ORAL | Status: AC
  Administered 2019-06-22: 4 mg via ORAL
  Filled 2019-06-22: qty 1

## 2019-06-22 MED ORDER — OXYCODONE-ACETAMINOPHEN 5-325 MG PO TABS: 2.0000 | ORAL_TABLET | ORAL | 0 refills | Status: DC | PRN

## 2019-06-22 MED ORDER — PREDNISONE 20 MG PO TABS
60.0000 mg | ORAL_TABLET | Freq: Once | ORAL | Status: AC
  Administered 2019-06-22: 60 mg via ORAL
  Filled 2019-06-22: qty 3

## 2019-06-22 MED ORDER — DIAZEPAM 5 MG PO TABS
5.0000 mg | ORAL_TABLET | Freq: Once | ORAL | Status: AC
  Administered 2019-06-22: 13:00:00 5 mg via ORAL
  Filled 2019-06-22: qty 1

## 2019-06-22 NOTE — ED Notes (Signed)
Transported to MRI

## 2019-06-22 NOTE — ED Notes (Signed)
Pt to xray

## 2019-06-22 NOTE — ED Provider Notes (Signed)
MRI showed left-sided disc bulge pressing on the foramina.  The patient's symptoms have been bilateral but most of the pain is been more to the right thigh area.  So this may or may not represent a component of sciatica.  No significant central herniation.  Patient be treated with prednisone.  Pain medication follow-up with neurosurgery work note provided.  Patient with improvement with pain control here.  Stable for discharge home.   Fredia Sorrow, MD 06/22/19 1754

## 2019-06-22 NOTE — ED Provider Notes (Signed)
[facility] DEPT Provider Note   CSN: [id number] Arrival date & time: 06/22/19  1008     History   Chief Complaint Chief Complaint  Patient presents with  . Back Pain    HPI Renee Ho is a 43 y.o. female.     43 year old female with past medical history below including hypertension, hyperlipidemia, OSA, PCOS, type 2 diabetes mellitus who presents with low back pain.  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  She had a sudden onset of severe, midline low back pain that was debilitating.  She tried to get through the rest of her shift but when she got home she was in severe, constant pain.  She has had numbness down her right leg to her foot.  She tried Flexeril without relief.  She had an episode of urinary incontinence because she was unable to get up to get to the bathroom.  This morning, she was still in distress which is what prompted her to come in.  She has never had back pain like this before and denies any history of back injury.  No fevers or recent illness.  No IV drug use.  She has difficulty walking as she has to walk hunched over but has not noticed that she is dragging a leg or foot.  The history is provided by the patient.  Back Pain   Past Medical History:  Diagnosis Date  . Depression   . Diabetes mellitus   . Diabetes mellitus without complication ([place])   . Diverticula of intestine   . High cholesterol   . Hypertension   . OSA on CPAP    CPAP pressure= 15  . Polycystic ovarian syndrome   . Sleep apnea    on CPAP with pressure setting= 15    Patient Active Problem List   Diagnosis Date Noted  . Hyperglycemia 12/22/2016  . Injury of foot, left 07/18/2013  . HTN (hypertension) 09/18/2012  . Morbid obesity ([place]) 09/18/2012  . S/P hysterectomy 06/20/2012  . H/O colonoscopy with polypectomy 06/20/2012  . Depression 12/30/2011  . Plantar  fasciitis 04/22/2011  . HYPERTRIGLYCERIDEMIA 02/21/2008  . DIABETES MELLITUS, TYPE II 02/24/2007  . POLYCYSTIC OVARIAN DISEASE 02/24/2007  . HYPERTENSION 02/24/2007  . Sleep apnea 02/24/2007    Past Surgical History:  Procedure Laterality Date  . ABDOMINAL HYSTERECTOMY    . CYST REMOVAL NECK    . KNEE ARTHROSCOPY  07/11/2012   Procedure: ARTHROSCOPY KNEE;  Surgeon: Meredith Pel, MD;  Location: [facility];  Service: Orthopedics;  Laterality: Right;  Right knee arthroscopy with debridement  . KNEE SURGERY    . OVARIAN CYST REMOVAL    . WISDOM TOOTH EXTRACTION  05/11/2017     OB History    Gravida  0   Para  0   Term  0   Preterm  0   AB  0   Living        SAB  0   TAB  0   Ectopic  0   Multiple      Live Births               Home Medications    Prior to Admission medications   Medication Sig Start Date End Date Taking? Authorizing Provider  atorvastatin (LIPITOR) 10 MG tablet Take 1 tablet (10 mg total) by mouth daily. 11/20/18   [name]  R, MD  blood glucose meter kit and supplies KIT Dispense based on patient and insurance preference. Use up to four times daily as directed. (FOR ICD-9 250.00, 250.01). 12/09/16   Wendie Agreste, MD  dapagliflozin propanediol (FARXIGA) 10 MG TABS tablet Take 10 mg by mouth daily. 11/20/18   Wendie Agreste, MD  DULoxetine (CYMBALTA) 30 MG capsule Take 1 capsule (30 mg total) by mouth daily. 11/20/18   Wendie Agreste, MD  DULoxetine (CYMBALTA) 60 MG capsule Take 1 capsule (60 mg total) by mouth daily. 11/20/18   Wendie Agreste, MD  glucose blood (FREESTYLE LITE) test strip USE AS DIRECTED THREE TO FOUR TIMES DAILY 11/20/18   Wendie Agreste, MD  hydrochlorothiazide (HYDRODIURIL) 25 MG tablet Take 1 tablet (25 mg total) by mouth daily. 11/20/18   Wendie Agreste, MD  insulin glargine (LANTUS) 100 UNIT/ML injection Inject 1 mL (100 Units total) into the skin daily. 11/20/18   Wendie Agreste, MD  Insulin  Syringe-Needle U-100 (TRUEPLUS INSULIN SYRINGE) 30G X 5/16" 0.5 ML MISC USE AS DIRECTED EVERY DAY with insulin pen 11/20/18   Wendie Agreste, MD  lisinopril (PRINIVIL,ZESTRIL) 5 MG tablet Take 1 tablet (5 mg total) by mouth daily. 11/20/18   Wendie Agreste, MD  metFORMIN (GLUCOPHAGE) 1000 MG tablet Take 1 tablet (1,000 mg total) by mouth 2 (two) times daily with a meal. 11/20/18   Wendie Agreste, MD    Family History Family History  Problem Relation Age of Onset  . Cancer Mother   . Mental illness Mother   . Cancer Maternal Grandfather   . Stroke Paternal Grandmother     Social History Social History   Tobacco Use  . Smoking status: Former Smoker    Packs/day: 1.00    Types: Cigarettes    Quit date: 12/18/2016    Years since quitting: 2.5  . Smokeless tobacco: Never Used  . Tobacco comment: stopped smoking on 02/17/17  Substance Use Topics  . Alcohol use: No    Alcohol/week: 0.0 standard drinks    Comment: Rarely.  . Drug use: No     Allergies   Morphine and related, Reglan [metoclopramide], and Toradol [ketorolac tromethamine]   Review of Systems Review of Systems  Musculoskeletal: Positive for back pain.   All other systems reviewed and are negative except that which was mentioned in HPI   Physical Exam Updated Vital Signs BP 130/78   Pulse 69   Temp 98.3 F (36.8 C) (Oral)   Resp 18   Ht '5\' 5"'  (1.651 m)   Wt 119.7 kg   SpO2 95%   BMI 43.93 kg/m   Physical Exam Vitals signs and nursing note reviewed.  Constitutional:      General: She is in acute distress.     Appearance: She is well-developed.     Comments: Crying, laying on side  HENT:     Head: Normocephalic and atraumatic.  Eyes:     Conjunctiva/sclera: Conjunctivae normal.  Neck:     Musculoskeletal: Neck supple.  Cardiovascular:     Pulses: Normal pulses.  Pulmonary:     Effort: Pulmonary effort is normal.  Musculoskeletal:        General: Tenderness present.     Comments:  Tenderness midline lumbar back around L2-L4 area, mild R paraspinal muscle tenderness, no stepoff  Skin:    General: Skin is warm and dry.  Neurological:     Mental Status: She is alert and oriented  to person, place, and time.     Motor: No weakness.     Deep Tendon Reflexes: Reflexes normal.     Comments: 1+ b/l patellar and achilles DTRs  Psychiatric:        Judgment: Judgment normal.     Comments: Distressed, tearful      ED Treatments / Results  Labs (all labs ordered are listed, but only abnormal results are displayed) Labs Reviewed - No data to display  EKG None  Radiology Dg Lumbar Spine Complete  Result Date: 06/22/2019 CLINICAL DATA:  Acute low back pain following injury yesterday. Initial encounter. EXAM: LUMBAR SPINE - COMPLETE 4+ VIEW COMPARISON:  03/08/2013 and prior radiographs FINDINGS: No acute fracture or subluxation. Mild disc space narrowing at L4-5 and L5-S1 noted. Probable partially calcified posterior disc at L3-4 noted which appears to be bulging or protruded. No focal bony lesions or spondylolysis. Mild aortic atherosclerotic calcifications are present. IMPRESSION: 1. No acute fracture or subluxation. 2. Probable disc bulge/protrusion at L3-4. 3. Degenerative disc disease at L4-5 and L5-S1. 4.  Aortic Atherosclerosis (ICD10-I70.0). Electronically Signed   By: Margarette Canada M.D.   On: 06/22/2019 13:53    Procedures Procedures (including critical care time)  Medications Ordered in ED Medications  lidocaine (LIDODERM) 5 % 1 patch (1 patch Transdermal Patch Applied 06/22/19 1241)  HYDROmorphone (DILAUDID) injection 1 mg (1 mg Intramuscular Given 06/22/19 1241)  diazepam (VALIUM) tablet 5 mg (5 mg Oral Given 06/22/19 1241)  ondansetron (ZOFRAN-ODT) disintegrating tablet 8 mg (8 mg Oral Given 06/22/19 1329)  oxyCODONE-acetaminophen (PERCOCET/ROXICET) 5-325 MG per tablet 2 tablet (2 tablets Oral Given 06/22/19 1511)     Initial Impression / Assessment and Plan / ED  Course  I have reviewed the triage vital signs and the nursing notes.  Pertinent imaging results that were available during my care of the patient were reviewed by me and considered in my medical decision making (see chart for details).       PT in distress on exam. Given midline pain, ddx includes fracture, metastatic bone lesion, disc herniation. XR shows no fractures, probable L3-4 disc protrusion. Pt still in significant distress after above medications.  Because she has had numbness and ongoing difficulty ambulating due to the severity of her symptoms, will obtain MRI to rule out acute surgical process.  I am signing out to oncoming provider pending MRI results and reassessment.  Final Clinical Impressions(s) / ED Diagnoses   Final diagnoses:  None    ED Discharge Orders    None       Little, Wenda Overland, MD 06/22/19 1541

## 2019-06-22 NOTE — ED Triage Notes (Signed)
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   C/o 10/10 burn mid lower back pain.   Patient finished out the rest of her shift and went home and took flexeril and heating pad with some relief.   This morning patient states she has right lef and foot numbness and had to have assistance with putting her clothes on.    A/ox[facility] Ambulatory in triage.

## 2019-06-22 NOTE — Discharge Instructions (Signed)
Work note provided to be out of work for 2 weeks.  Make follow-up appointment with neurosurgery.  Take prednisone as directed.  Take the pain medicine as directed.  Rest as much as possible.  Return for any new or worse symptoms.

## 2019-06-22 NOTE — ED Triage Notes (Signed)
Pt states while at work she had acute onset lower back pain. She has had decreased mobility, states it hurts to walk and she cannot stand up straight and has numbness radiating to right lower back down the right lower leg.

## 2019-06-25 ENCOUNTER — Other Ambulatory Visit: Payer: Self-pay | Admitting: Family Medicine

## 2019-06-25 MED FILL — LISINOPRIL 5 MG TABLET: 5 | 90 days supply | Qty: 90 | Fill #0

## 2019-06-25 MED FILL — FARXIGA 10 MG TABLET: 10 | 30 days supply | Qty: 30 | Fill #0

## 2019-07-30 MED FILL — GABAPENTIN 300 MG CAPSULE: 300 | 30 days supply | Qty: 90 | Fill #0

## 2019-07-30 MED FILL — MELOXICAM 15 MG TABLET: 15 | 30 days supply | Qty: 30 | Fill #0

## 2019-07-31 ENCOUNTER — Other Ambulatory Visit: Payer: Self-pay | Admitting: Family Medicine

## 2019-07-31 DIAGNOSIS — I10 Essential (primary) hypertension: Secondary | ICD-10-CM

## 2019-07-31 MED FILL — DULoxetine HCL 60 MG CPEP: 60 | 90 days supply | Qty: 90 | Fill #0

## 2019-07-31 MED FILL — ATORVASTATIN 10 MG TABLET: 10 | 90 days supply | Qty: 90 | Fill #1

## 2019-07-31 MED FILL — metFORMIN HCL 1000 MG TABS: 1000 | 90 days supply | Qty: 180 | Fill #0

## 2019-07-31 MED FILL — FARXIGA 10 MG TABLET: 10 | 30 days supply | Qty: 30 | Fill #1

## 2019-07-31 MED FILL — LANTUS 100 UNITS/ML VIAL: 100 | 90 days supply | Qty: 90 | Fill #1

## 2019-07-31 MED FILL — DULoxetine HCL 30 MG CPEP: 30 | 90 days supply | Qty: 90 | Fill #1

## 2019-08-09 DIAGNOSIS — M5416 Radiculopathy, lumbar region: Secondary | ICD-10-CM | POA: Insufficient documentation

## 2019-08-25 ENCOUNTER — Ambulatory Visit (HOSPITAL_COMMUNITY)
Admission: EM | Admit: 2019-08-25 | Discharge: 2019-08-25 | Disposition: A | Discharge status: Home / Self Care | Payer: No Typology Code available for payment source | Attending: Family Medicine | Admitting: Family Medicine

## 2019-08-25 ENCOUNTER — Other Ambulatory Visit: Payer: Self-pay

## 2019-08-25 ENCOUNTER — Encounter (HOSPITAL_COMMUNITY): Payer: Self-pay

## 2019-08-25 DIAGNOSIS — L0291 Cutaneous abscess, unspecified: Secondary | ICD-10-CM | POA: Diagnosis not present

## 2019-08-25 MED ORDER — DOXYCYCLINE HYCLATE 100 MG PO CAPS: 100.0000 mg | ORAL_CAPSULE | Freq: Two times a day (BID) | ORAL | 0 refills | Status: DC

## 2019-08-25 MED ORDER — LIDOCAINE HCL (PF) 1 % IJ SOLN
INTRAMUSCULAR | Status: AC
  Filled 2019-08-25: qty 2

## 2019-08-25 NOTE — ED Triage Notes (Signed)
Pt presents to UC w/ c/o right groin abscess x4 days. Pt states it has grown w/in past 4 days and it is very painful at this time. Pt denies seeing any drainage.

## 2019-08-25 NOTE — ED Provider Notes (Signed)
[facility]    CSN: [id number] Arrival date & time: 08/25/19  1000      History   Chief Complaint Chief Complaint  Patient presents with   Abscess    HPI Renee Ho is a 43 y.o. female.    presents with abscess superior to pubis bine  X 4 days  That is persistent and worsening in nature.  Condition is acute in nature. Condition is made better by nothing. Condition is made worse by nothing. Patient denies any treatment prior to there arrival at this facility. Area is phlegmonous in nature. Patient education provided on heat and warm compresses. Patient is requesting an I&D for possible pain relieve. Patient denies fevers      Past Medical History:  Diagnosis Date   Depression    Diabetes mellitus    Diabetes mellitus without complication ([place])    Diverticula of intestine    High cholesterol    Hypertension    OSA on CPAP    CPAP pressure= 15   Polycystic ovarian syndrome    Sleep apnea    on CPAP with pressure setting= 15    Patient Active Problem List   Diagnosis Date Noted   Hyperglycemia 12/22/2016   Injury of foot, left 07/18/2013   HTN (hypertension) 09/18/2012   Morbid obesity ([place]) 09/18/2012   S/P hysterectomy 06/20/2012   H/O colonoscopy with polypectomy 06/20/2012   Depression 12/30/2011   Plantar fasciitis 04/22/2011   HYPERTRIGLYCERIDEMIA 02/21/2008   DIABETES MELLITUS, TYPE II 02/24/2007   POLYCYSTIC OVARIAN DISEASE 02/24/2007   HYPERTENSION 02/24/2007   Sleep apnea 02/24/2007    Past Surgical History:  Procedure Laterality Date   ABDOMINAL HYSTERECTOMY     CYST REMOVAL NECK     KNEE ARTHROSCOPY  07/11/2012   Procedure: ARTHROSCOPY KNEE;  Surgeon: Meredith Pel, MD;  Location: [facility];  Service: Orthopedics;  Laterality: Right;  Right knee arthroscopy with debridement   KNEE SURGERY     OVARIAN CYST REMOVAL     WISDOM TOOTH EXTRACTION  05/11/2017    OB History    Gravida  0   Para   0   Term  0   Preterm  0   AB  0   Living        SAB  0   TAB  0   Ectopic  0   Multiple      Live Births               Home Medications    Prior to Admission medications   Medication Sig Start Date End Date Taking? Authorizing Provider  atorvastatin (LIPITOR) 10 MG tablet Take 1 tablet (10 mg total) by mouth daily. 11/20/18   Wendie Agreste, MD  blood glucose meter kit and supplies KIT Dispense based on patient and insurance preference. Use up to four times daily as directed. (FOR ICD-9 250.00, 250.01). 12/09/16   Wendie Agreste, MD  doxycycline (VIBRAMYCIN) 100 MG capsule Take 1 capsule (100 mg total) by mouth 2 (two) times daily. 08/25/19   Bast, Tressia Miners A, NP  DULoxetine (CYMBALTA) 30 MG capsule Take 1 capsule (30 mg total) by mouth daily. 11/20/18   Wendie Agreste, MD  DULoxetine (CYMBALTA) 60 MG capsule Take 1 capsule (60 mg total) by mouth daily. 11/20/18   Wendie Agreste, MD  FARXIGA 10 MG TABS tablet TAKE 1 TABLET BY MOUTH ONCE DAILY 06/25/19   Wendie Agreste, MD  glucose blood (FREESTYLE  LITE) test strip USE AS DIRECTED THREE TO FOUR TIMES DAILY 11/20/18   Wendie Agreste, MD  hydrochlorothiazide (HYDRODIURIL) 25 MG tablet TAKE 1 TABLET (25 MG TOTAL) BY MOUTH DAILY. 07/31/19   Wendie Agreste, MD  ibuprofen (ADVIL) 200 MG tablet Take 800 mg by mouth daily as needed for headache or moderate pain.    [provider]  insulin glargine (LANTUS) 100 UNIT/ML injection Inject 1 mL (100 Units total) into the skin daily. Patient taking differently: Inject 100 Units into the skin at bedtime.  11/20/18   Wendie Agreste, MD  Insulin Syringe-Needle U-100 (TRUEPLUS INSULIN SYRINGE) 30G X 5/16" 0.5 ML MISC USE AS DIRECTED EVERY DAY with insulin pen 11/20/18   Wendie Agreste, MD  lisinopril (PRINIVIL,ZESTRIL) 5 MG tablet Take 1 tablet (5 mg total) by mouth daily. 11/20/18   Wendie Agreste, MD  metFORMIN (GLUCOPHAGE) 1000 MG tablet Take 1 tablet (1,000  mg total) by mouth 2 (two) times daily with a meal. 11/20/18   Wendie Agreste, MD  oxyCODONE-acetaminophen (PERCOCET) 5-325 MG tablet Take 2 tablets by mouth every 4 (four) hours as needed for severe pain. 06/22/19   Little, Wenda Overland, MD  predniSONE (DELTASONE) 20 MG tablet Take 2 tablets (40 mg total) by mouth daily. 06/22/19   Little, Wenda Overland, MD    Family History Family History  Problem Relation Age of Onset   Cancer Mother    Mental illness Mother    Cancer Maternal Grandfather    Stroke Paternal Grandmother     Social History Social History   Tobacco Use   Smoking status: Former Smoker    Packs/day: 1.00    Types: Cigarettes    Quit date: 12/18/2016    Years since quitting: 2.6   Smokeless tobacco: Never Used   Tobacco comment: stopped smoking on 02/17/17  Substance Use Topics   Alcohol use: No    Alcohol/week: 0.0 standard drinks    Comment: Rarely.   Drug use: No     Allergies   Morphine and related, Reglan [metoclopramide], and Toradol [ketorolac tromethamine]   Review of Systems Review of Systems  Constitutional: Negative for chills and fever.  HENT: Negative for ear pain and sore throat.   Eyes: Negative for pain and visual disturbance.  Respiratory: Negative for cough and shortness of breath.   Cardiovascular: Negative for chest pain and palpitations.  Gastrointestinal: Negative for abdominal pain and vomiting.  Genitourinary: Negative for dysuria and hematuria.  Musculoskeletal: Negative for arthralgias and back pain.  Skin: Positive for rash. Negative for color change. Pallor:  abscess to pelvic area.  Neurological: Negative for seizures and syncope.  All other systems reviewed and are negative.    Physical Exam Triage Vital Signs ED Triage Vitals  Enc Vitals Group     BP 08/25/19 1012 (!) 147/103     Pulse Rate 08/25/19 1012 81     Resp 08/25/19 1012 18     Temp 08/25/19 1012 98.4 F (36.9 C)     Temp Source 08/25/19 1012  Oral     SpO2 08/25/19 1012 97 %     Weight --      Height --      Head Circumference --      Peak Flow --      Pain Score 08/25/19 1015 8     Pain Loc --      Pain Edu? --      Excl. in [place]? --  No data found.  Updated Vital Signs BP (!) 147/103 (BP Location: Right Arm)    Pulse 81    Temp 98.4 F (36.9 C) (Oral)    Resp 18    SpO2 97%   Visual Acuity Right Eye Distance:   Left Eye Distance:   Bilateral Distance:    Right Eye Near:   Left Eye Near:    Bilateral Near:     Physical Exam Vitals signs and nursing note reviewed.  Constitutional:      Appearance: She is well-developed.  HENT:     Head: Normocephalic and atraumatic.  Eyes:     Conjunctiva/sclera: Conjunctivae normal.  Neck:     Musculoskeletal: Normal range of motion.  Pulmonary:     Effort: Pulmonary effort is normal.  Skin:    Comments: Abscess phlegmonous with erythema noted approximate 5 cm in length.  Neurological:     Mental Status: She is alert and oriented to person, place, and time.      UC Treatments / Results  Labs (all labs ordered are listed, but only abnormal results are displayed) Labs Reviewed - No data to display  EKG   Radiology No results found.  Procedures Incision and Drainage  Date/Time: 08/25/2019 1:26 PM Performed by: Jacqualine Mau, NP Authorized by: Jacqualine Mau, NP   Consent:    Consent obtained:  Verbal   Consent given by:  Patient   Risks discussed:  Bleeding, incomplete drainage, pain, damage to other organs and infection   Alternatives discussed:  Observation Universal protocol:    Procedure explained and questions answered to patient or proxy's satisfaction: yes     Relevant documents present and verified: yes     Test results available and properly labeled: yes     Imaging studies available: yes     Required blood products, implants, devices, and special equipment available: yes     Site/side marked: yes     Immediately prior to  procedure a time out was called: yes     Patient identity confirmed:  Verbally with patient Location:    Type:  Abscess   Size:  5 cm   Location: superior to pelvic bone. Pre-procedure details:    Skin preparation:  Betadine and antiseptic wash Anesthesia (see MAR for exact dosages):    Anesthesia method:  Local infiltration   Local anesthetic:  Lidocaine 1% WITH epi and lidocaine 2% WITH epi Procedure type:    Complexity:  Simple Procedure details:    Needle aspiration: no     Incision types:  Single straight   Incision depth:  Subcutaneous   Scalpel blade:  11   Wound management:  Probed and deloculated and extensive cleaning   Drainage:  Purulent and bloody (0.5 purulent)   Drainage amount:  Scant   Wound treatment:  Wound left open   Packing materials:  None Post-procedure details:    Patient tolerance of procedure:  Tolerated well, no immediate complications   (including critical care time)  Medications Ordered in UC Medications  lidocaine (PF) (XYLOCAINE) 1 % injection (has no administration in time range)    Initial Impression / Assessment and Plan / UC Course  I have reviewed the triage vital signs and the nursing notes.  Pertinent labs & imaging results that were available during my care of the patient were reviewed by me and considered in my medical decision making (see chart for details).      Final Clinical Impressions(s) / UC Diagnoses  Final diagnoses:  Abscess     Discharge Instructions     Warm compresses Take antibiotics as prescribed and follow up in one week.    ED Prescriptions    Medication Sig Dispense Auth. Provider   doxycycline (VIBRAMYCIN) 100 MG capsule Take 1 capsule (100 mg total) by mouth 2 (two) times daily. 20 capsule Loura Halt A, NP     PDMP not reviewed this encounter.   Jacqualine Mau, NP 08/25/19 1329

## 2019-08-25 NOTE — Discharge Instructions (Signed)
Warm compresses Take antibiotics as prescribed and follow up in one week.

## 2019-10-10 MED FILL — FARXIGA 10 MG TABLET: 10 | 30 days supply | Qty: 30 | Fill #2

## 2019-10-10 MED FILL — GABAPENTIN 300 MG CAPSULE: 300 | 30 days supply | Qty: 90 | Fill #0

## 2019-10-10 MED FILL — HYDROCHLOROTHIAZIDE 25 MG T: 25 | 90 days supply | Qty: 90 | Fill #0

## 2019-10-30 MED FILL — METHOCARBAMOL 500 MG TABS: 500 | 15 days supply | Qty: 60 | Fill #0

## 2019-11-28 MED FILL — diazePAM 5 MG TABS: 5 | 1 days supply | Qty: 2 | Fill #0

## 2019-12-24 ENCOUNTER — Ambulatory Visit (HOSPITAL_COMMUNITY)
Admission: EM | Admit: 2019-12-24 | Discharge: 2019-12-24 | Disposition: A | Discharge status: Home / Self Care | Payer: No Typology Code available for payment source

## 2019-12-24 ENCOUNTER — Encounter (HOSPITAL_COMMUNITY): Payer: Self-pay

## 2019-12-24 ENCOUNTER — Other Ambulatory Visit: Payer: Self-pay

## 2019-12-24 DIAGNOSIS — H1031 Unspecified acute conjunctivitis, right eye: Secondary | ICD-10-CM | POA: Diagnosis not present

## 2019-12-24 MED ORDER — OLOPATADINE HCL 0.1 % OP SOLN: 1.0000 [drp] | Freq: Every day | OPHTHALMIC | 0 refills | Status: AC

## 2019-12-24 MED ORDER — POLYMYXIN B-TRIMETHOPRIM 10000-0.1 UNIT/ML-% OP SOLN: 1.0000 [drp] | OPHTHALMIC | 0 refills | Status: AC

## 2019-12-24 MED FILL — POLYMYXIN B/TMP EYE DROPS: 10000-0.1 | 25 days supply | Qty: 10 | Fill #0

## 2019-12-24 MED FILL — OLOPATADINE HCL 0.1% EYE DR: 0.1 | 14 days supply | Qty: 5 | Fill #0

## 2019-12-24 NOTE — Discharge Instructions (Signed)
Please use both the antihistamine drop as well as antibiotic drop.  If no improvement or if worsening in the next two days please follow up with your eye doctor.  Avoid touching your left eye to prevent possible spread

## 2019-12-24 NOTE — ED Triage Notes (Addendum)
Pt is here with right eye irritation that started this morning states her eye was closed completely shut, its draining & burning, pt has taken to relieve discomfort.

## 2019-12-24 NOTE — ED Provider Notes (Signed)
[facility]    CSN: [id number] Arrival date & time: 12/24/19  0240      History   Chief Complaint Chief Complaint  Patient presents with  . eye irritation    HPI Renee Ho is a 44 y.o. female.   Renee Ho presents with complaints of right eye redness, drainage, burning and itching which she woke with this morning. Some blurring to her vision but otherwise no double vision or loss of vision. No photophobia. No eye ball pain or movement pain. Wears glasses, no contacts. No known ill contacts. Had been at the park yesterday with her grandchildren. No other cough, congestion, sneezing or other URI or allergy symptoms. States feels similar to pink eye she has had in the past.     ROS per HPI, negative if not otherwise mentioned.      Past Medical History:  Diagnosis Date  . Depression   . Diabetes mellitus   . Diabetes mellitus without complication ([place])   . Diverticula of intestine   . High cholesterol   . Hypertension   . OSA on CPAP    CPAP pressure= 15  . Polycystic ovarian syndrome   . Sleep apnea    on CPAP with pressure setting= 15    Patient Active Problem List   Diagnosis Date Noted  . Hyperglycemia 12/22/2016  . Injury of foot, left 07/18/2013  . HTN (hypertension) 09/18/2012  . Morbid obesity ([place]) 09/18/2012  . S/P hysterectomy 06/20/2012  . H/O colonoscopy with polypectomy 06/20/2012  . Depression 12/30/2011  . Plantar fasciitis 04/22/2011  . HYPERTRIGLYCERIDEMIA 02/21/2008  . DIABETES MELLITUS, TYPE II 02/24/2007  . POLYCYSTIC OVARIAN DISEASE 02/24/2007  . HYPERTENSION 02/24/2007  . Sleep apnea 02/24/2007    Past Surgical History:  Procedure Laterality Date  . ABDOMINAL HYSTERECTOMY    . CYST REMOVAL NECK    . KNEE ARTHROSCOPY  07/11/2012   Procedure: ARTHROSCOPY KNEE;  Surgeon: Meredith Pel, MD;  Location: [facility];  Service: Orthopedics;  Laterality: Right;  Right knee arthroscopy with debridement  .  KNEE SURGERY    . OVARIAN CYST REMOVAL    . WISDOM TOOTH EXTRACTION  05/11/2017    OB History    Gravida  0   Para  0   Term  0   Preterm  0   AB  0   Living        SAB  0   TAB  0   Ectopic  0   Multiple      Live Births               Home Medications    Prior to Admission medications   Medication Sig Start Date End Date Taking? Authorizing Provider  gabapentin (NEURONTIN) 300 MG capsule Take 1 tablet at bedtime X 3 days, then increase to twice daily X 3 days, then increase to three times daily 10/10/19  Yes [provider]  atorvastatin (LIPITOR) 10 MG tablet Take 1 tablet (10 mg total) by mouth daily. 11/20/18   Wendie Agreste, MD  blood glucose meter kit and supplies KIT Dispense based on patient and insurance preference. Use up to four times daily as directed. (FOR ICD-9 250.00, 250.01). 12/09/16   Wendie Agreste, MD  diazepam (VALIUM) 5 MG tablet Take 5 mg by mouth 2 (two) times daily as needed. 11/28/19   [provider]  doxycycline (VIBRAMYCIN) 100 MG capsule Take 1 capsule (100 mg total) by mouth  2 (two) times daily. 08/25/19   Bast, Tressia Miners A, NP  DULoxetine (CYMBALTA) 30 MG capsule Take 1 capsule (30 mg total) by mouth daily. 11/20/18   Wendie Agreste, MD  DULoxetine (CYMBALTA) 60 MG capsule Take 1 capsule (60 mg total) by mouth daily. 11/20/18   Wendie Agreste, MD  FARXIGA 10 MG TABS tablet TAKE 1 TABLET BY MOUTH ONCE DAILY 06/25/19   Wendie Agreste, MD  glucose blood (FREESTYLE LITE) test strip USE AS DIRECTED THREE TO FOUR TIMES DAILY 11/20/18   Wendie Agreste, MD  hydrochlorothiazide (HYDRODIURIL) 25 MG tablet TAKE 1 TABLET (25 MG TOTAL) BY MOUTH DAILY. 07/31/19   Wendie Agreste, MD  ibuprofen (ADVIL) 200 MG tablet Take 800 mg by mouth daily as needed for headache or moderate pain.    [provider]  insulin glargine (LANTUS) 100 UNIT/ML injection Inject 1 mL (100 Units total) into the skin daily. Patient taking  differently: Inject 100 Units into the skin at bedtime.  11/20/18   Wendie Agreste, MD  Insulin Syringe-Needle U-100 (TRUEPLUS INSULIN SYRINGE) 30G X 5/16" 0.5 ML MISC USE AS DIRECTED EVERY DAY with insulin pen 11/20/18   Wendie Agreste, MD  lisinopril (PRINIVIL,ZESTRIL) 5 MG tablet Take 1 tablet (5 mg total) by mouth daily. 11/20/18   Wendie Agreste, MD  meloxicam (MOBIC) 15 MG tablet Take 15 mg by mouth daily. 07/30/19   [provider]  metFORMIN (GLUCOPHAGE) 1000 MG tablet Take 1 tablet (1,000 mg total) by mouth 2 (two) times daily with a meal. 11/20/18   Wendie Agreste, MD  methocarbamol (ROBAXIN) 500 MG tablet Take 500 mg by mouth every 6 (six) hours as needed. 10/30/19   [provider]  olopatadine (PATANOL) 0.1 % ophthalmic solution Place 1 drop into both eyes daily for 14 days. 12/24/19 01/07/20  Zigmund Gottron, NP  oxyCODONE-acetaminophen (PERCOCET) 5-325 MG tablet Take 2 tablets by mouth every 4 (four) hours as needed for severe pain. 06/22/19   Little, Wenda Overland, MD  predniSONE (DELTASONE) 20 MG tablet Take 2 tablets (40 mg total) by mouth daily. 06/22/19   Little, Wenda Overland, MD  trimethoprim-polymyxin b (POLYTRIM) ophthalmic solution Place 1 drop into the right eye every 4 (four) hours for 5 days. 12/24/19 12/29/19  Zigmund Gottron, NP    Family History Family History  Problem Relation Age of Onset  . Cancer Mother   . Mental illness Mother   . Healthy Father   . Cancer Maternal Grandfather   . Stroke Paternal Grandmother     Social History Social History   Tobacco Use  . Smoking status: Former Smoker    Packs/day: 1.00    Types: Cigarettes    Quit date: 12/18/2016    Years since quitting: 3.0  . Smokeless tobacco: Never Used  . Tobacco comment: stopped smoking on 02/17/17  Substance Use Topics  . Alcohol use: No    Alcohol/week: 0.0 standard drinks    Comment: Rarely.  . Drug use: No     Allergies   Morphine and related, Reglan  [metoclopramide], and Toradol [ketorolac tromethamine]   Review of Systems Review of Systems   Physical Exam Triage Vital Signs ED Triage Vitals  Enc Vitals Group     BP 12/24/19 0847 (!) 148/80     Pulse Rate 12/24/19 0847 88     Resp 12/24/19 0847 (!) 22     Temp 12/24/19 0847 98.4 F (36.9 C)  Temp Source 12/24/19 0847 Oral     SpO2 12/24/19 0847 96 %     Weight 12/24/19 0844 276 lb 6.4 oz (125.4 kg)     Height --      Head Circumference --      Peak Flow --      Pain Score 12/24/19 0844 7     Pain Loc --      Pain Edu? --      Excl. in [place]? --    No data found.  Updated Vital Signs BP (!) 148/80 (BP Location: Right Arm)   Pulse 88   Temp 98.4 F (36.9 C) (Oral)   Resp (!) 22   Wt 276 lb 6.4 oz (125.4 kg)   SpO2 96%   BMI 46.00 kg/m   Visual Acuity Right Eye Distance: 20/40 Left Eye Distance: 20/40 Bilateral Distance: 20/40  Right Eye Near:   Left Eye Near:    Bilateral Near:     Physical Exam Constitutional:      General: She is not in acute distress.    Appearance: She is well-developed.  Eyes:     General: Lids are normal. Vision grossly intact. Gaze aligned appropriately.        Right eye: Discharge present.     Extraocular Movements: Extraocular movements intact.     Conjunctiva/sclera:     Right eye: Right conjunctiva is injected.     Comments: Redness to right eye with tearing as well as some purulent drainage noted   Cardiovascular:     Rate and Rhythm: Normal rate.  Pulmonary:     Effort: Pulmonary effort is normal.  Skin:    General: Skin is warm and dry.  Neurological:     Mental Status: She is alert and oriented to person, place, and time.      UC Treatments / Results  Labs (all labs ordered are listed, but only abnormal results are displayed) Labs Reviewed - No data to display  EKG   Radiology No results found.  Procedures Procedures (including critical care time)  Medications Ordered in UC Medications - No data  to display  Initial Impression / Assessment and Plan / UC Course  I have reviewed the triage vital signs and the nursing notes.  Pertinent labs & imaging results that were available during my care of the patient were reviewed by me and considered in my medical decision making (see chart for details).     Conjunctivitis, unilateral. Coverage with polytrim. patanol recommended as well as this started after having been outside yesterday. Follow up with eye doctor and precautions provided. Patient verbalized understanding and agreeable to plan.   Final Clinical Impressions(s) / UC Diagnoses   Final diagnoses:  Acute conjunctivitis of right eye, unspecified acute conjunctivitis type     Discharge Instructions     Please use both the antihistamine drop as well as antibiotic drop.  If no improvement or if worsening in the next two days please follow up with your eye doctor.  Avoid touching your left eye to prevent possible spread   ED Prescriptions    Medication Sig Dispense Auth. Provider   olopatadine (PATANOL) 0.1 % ophthalmic solution Place 1 drop into both eyes daily for 14 days. 5 mL Augusto Gamble B, NP   trimethoprim-polymyxin b (POLYTRIM) ophthalmic solution Place 1 drop into the right eye every 4 (four) hours for 5 days. 10 mL Zigmund Gottron, NP     PDMP not reviewed this encounter.  Zigmund Gottron, NP 12/24/19 [phone]

## 2019-12-28 ENCOUNTER — Ambulatory Visit: Payer: No Typology Code available for payment source

## 2020-01-01 MED FILL — METHYLPREDNISOLONE 4 MG TAB: 4 | 6 days supply | Qty: 21 | Fill #0

## 2020-01-01 MED FILL — LIDOCAINE PATCH 5%: 5 | 30 days supply | Qty: 30 | Fill #0

## 2020-02-21 ENCOUNTER — Ambulatory Visit: Payer: No Typology Code available for payment source | Admitting: Physician Assistant

## 2020-04-07 ENCOUNTER — Other Ambulatory Visit: Payer: Self-pay

## 2020-04-07 ENCOUNTER — Other Ambulatory Visit: Payer: Self-pay | Admitting: Physician Assistant

## 2020-04-07 ENCOUNTER — Ambulatory Visit (INDEPENDENT_AMBULATORY_CARE_PROVIDER_SITE_OTHER): Payer: No Typology Code available for payment source | Admitting: Physician Assistant

## 2020-04-07 ENCOUNTER — Encounter: Payer: Self-pay | Admitting: Physician Assistant

## 2020-04-07 VITALS — BP 160/82 | HR 72 | Temp 98.2°F | Ht 66.0 in | Wt 263.0 lb

## 2020-04-07 DIAGNOSIS — Z794 Long term (current) use of insulin: Secondary | ICD-10-CM

## 2020-04-07 DIAGNOSIS — E1159 Type 2 diabetes mellitus with other circulatory complications: Secondary | ICD-10-CM

## 2020-04-07 DIAGNOSIS — F419 Anxiety disorder, unspecified: Secondary | ICD-10-CM | POA: Diagnosis not present

## 2020-04-07 DIAGNOSIS — Z1231 Encounter for screening mammogram for malignant neoplasm of breast: Secondary | ICD-10-CM

## 2020-04-07 DIAGNOSIS — E1169 Type 2 diabetes mellitus with other specified complication: Secondary | ICD-10-CM

## 2020-04-07 DIAGNOSIS — F329 Major depressive disorder, single episode, unspecified: Secondary | ICD-10-CM

## 2020-04-07 DIAGNOSIS — I152 Hypertension secondary to endocrine disorders: Secondary | ICD-10-CM

## 2020-04-07 DIAGNOSIS — F32A Depression, unspecified: Secondary | ICD-10-CM

## 2020-04-07 DIAGNOSIS — L729 Follicular cyst of the skin and subcutaneous tissue, unspecified: Secondary | ICD-10-CM

## 2020-04-07 DIAGNOSIS — E1165 Type 2 diabetes mellitus with hyperglycemia: Secondary | ICD-10-CM

## 2020-04-07 DIAGNOSIS — E785 Hyperlipidemia, unspecified: Secondary | ICD-10-CM

## 2020-04-07 DIAGNOSIS — I10 Essential (primary) hypertension: Secondary | ICD-10-CM

## 2020-04-07 DIAGNOSIS — Z7689 Persons encountering health services in other specified circumstances: Secondary | ICD-10-CM

## 2020-04-07 DIAGNOSIS — Z1159 Encounter for screening for other viral diseases: Secondary | ICD-10-CM

## 2020-04-07 DIAGNOSIS — E781 Pure hyperglyceridemia: Secondary | ICD-10-CM

## 2020-04-07 DIAGNOSIS — Z0000 Encounter for general adult medical examination without abnormal findings: Secondary | ICD-10-CM

## 2020-04-07 MED ORDER — METFORMIN HCL 1000 MG PO TABS: 1000.0000 mg | ORAL_TABLET | Freq: Two times a day (BID) | ORAL | 1 refills | Status: DC

## 2020-04-07 MED ORDER — BASAGLAR KWIKPEN 100 UNIT/ML ~~LOC~~ SOPN: 100.0000 [IU] | PEN_INJECTOR | Freq: Every day | SUBCUTANEOUS | 3 refills | Status: DC

## 2020-04-07 MED ORDER — DAPAGLIFLOZIN PROPANEDIOL 10 MG PO TABS: 10.0000 mg | ORAL_TABLET | Freq: Every day | ORAL | 1 refills | Status: DC

## 2020-04-07 MED ORDER — DULOXETINE HCL 30 MG PO CPEP: 90.0000 mg | ORAL_CAPSULE | Freq: Every day | ORAL | 1 refills | Status: DC

## 2020-04-07 MED ORDER — BLOOD GLUCOSE MONITOR KIT: PACK | 0 refills | Status: DC

## 2020-04-07 MED ORDER — HYDROCHLOROTHIAZIDE 25 MG PO TABS: 25.0000 mg | ORAL_TABLET | Freq: Every day | ORAL | 1 refills | Status: DC

## 2020-04-07 MED ORDER — DOXYCYCLINE HYCLATE 100 MG PO TABS: 100.0000 mg | ORAL_TABLET | Freq: Two times a day (BID) | ORAL | 0 refills | Status: DC

## 2020-04-07 MED ORDER — EASY TOUCH SAFETY PEN NEEDLES 29G X 8MM MISC: 3 refills | Status: DC

## 2020-04-07 MED ORDER — LISINOPRIL 5 MG PO TABS: 5.0000 mg | ORAL_TABLET | Freq: Every day | ORAL | 1 refills | Status: DC

## 2020-04-07 MED ORDER — ATORVASTATIN CALCIUM 10 MG PO TABS: 10.0000 mg | ORAL_TABLET | Freq: Every day | ORAL | 1 refills | Status: DC

## 2020-04-07 MED ORDER — DULOXETINE HCL 60 MG PO CPEP: 60.0000 mg | ORAL_CAPSULE | Freq: Every day | ORAL | 1 refills | Status: DC

## 2020-04-07 MED ORDER — DULOXETINE HCL 30 MG PO CPEP: 30.0000 mg | ORAL_CAPSULE | Freq: Every day | ORAL | 0 refills | Status: DC

## 2020-04-07 MED FILL — BASAGLAR 100 UNIT/ML KWIKPE: 100 | 15 days supply | Qty: 15 | Fill #0

## 2020-04-07 MED FILL — DOXYCYCLINE HYCLATE 100 MG: 100 | 10 days supply | Qty: 20 | Fill #0

## 2020-04-07 MED FILL — DULoxetine HCL 30 MG CPEP: 30 | 90 days supply | Qty: 90 | Fill #0

## 2020-04-07 MED FILL — FREESTYLE LANCETS: 25 days supply | Qty: 100 | Fill #0

## 2020-04-07 MED FILL — UNIFINE PENTIPS 8MM 31G: 31G X 8 MM | 90 days supply | Qty: 100 | Fill #0

## 2020-04-07 MED FILL — FARXIGA 10 MG TABLET: 10 | 90 days supply | Qty: 90 | Fill #0

## 2020-04-07 MED FILL — FREESTYLE LITE TEST STRIP: 25 days supply | Qty: 100 | Fill #0

## 2020-04-07 MED FILL — ATORVASTATIN CALCIUM 10 MG: 10 | 90 days supply | Qty: 90 | Fill #0

## 2020-04-07 MED FILL — HYDROCHLOROTHIAZIDE 25 MG T: 25 | 90 days supply | Qty: 90 | Fill #0

## 2020-04-07 MED FILL — metFORMIN HCL 1000 MG TABS: 1000 | 90 days supply | Qty: 180 | Fill #0

## 2020-04-07 MED FILL — FREESTYLE LITE METER: 30 days supply | Qty: 1 | Fill #0

## 2020-04-07 MED FILL — LISINOPRIL 5 MG TABS: 5 | 90 days supply | Qty: 90 | Fill #0

## 2020-04-07 MED FILL — DULoxetine HCL 60 MG CPEP: 60 | 90 days supply | Qty: 90 | Fill #0

## 2020-04-07 NOTE — Patient Instructions (Signed)

## 2020-04-07 NOTE — Progress Notes (Signed)
New Patient Office Visit  Subjective:  Patient ID: Renee Ho, female    DOB: [birth year]  Age: 44 y.o. MRN: [id number]  CC:  Chief Complaint  Patient presents with  . Establish Care    HPI Renee Ho presents to establish care and for medication refills. She has been out of her medications for over a month.  Past medical history is pertinent for multiple conditions including diabetes mellitus, hypertension, hyperlipidemia, OSA, PCOS, anxiety and depression.  She takes Metformin 1000 mg twice daily, Farxiga 10 mg, and Lantus 100 units at bedtime for diabetes management. Reports she was informed Lantus would no longer be covered by insurance and needs to be changed.  She has ran out of her glucose testing supplies so has not been checking her fasting blood sugars. But knows her glucose is uncontrolled because she is symptomatic with nausea, sweats, and blurry vision.  She also has concerns for a cyst on her abdomen, which drained couple days ago with purulent material. Denies fever or chills.   Past Medical History:  Diagnosis Date  . Depression   . Diabetes mellitus   . Diabetes mellitus without complication ([place])   . Diverticula of intestine   . High cholesterol   . Hypertension   . OSA on CPAP    CPAP pressure= 15  . Polycystic ovarian syndrome   . Sleep apnea    on CPAP with pressure setting= 15    Past Surgical History:  Procedure Laterality Date  . ABDOMINAL HYSTERECTOMY    . CYST REMOVAL NECK    . KNEE ARTHROSCOPY  07/11/2012   Procedure: ARTHROSCOPY KNEE;  Surgeon: Meredith Pel, MD;  Location: [facility];  Service: Orthopedics;  Laterality: Right;  Right knee arthroscopy with debridement  . KNEE SURGERY    . OVARIAN CYST REMOVAL    . WISDOM TOOTH EXTRACTION  05/11/2017    Family History  Problem Relation Age of Onset  . Cancer Mother        breast  . Mental illness Mother   . Depression Mother   . Hyperlipidemia Mother   . Healthy Father    . Hyperlipidemia Father   . Cancer Maternal Grandfather        unknown type  . Depression Maternal Grandfather   . Hyperlipidemia Maternal Grandfather   . Stroke Paternal Grandmother   . Hyperlipidemia Paternal Grandmother   . Diabetes Maternal Grandmother   . Hyperlipidemia Maternal Grandmother   . Hyperlipidemia Paternal Grandfather     Social History   Socioeconomic History  . Marital status: Married    Spouse name: Not on file  . Number of children: 1  . Years of education: College  . Highest education level: Not on file  Occupational History  . Not on file  Tobacco Use  . Smoking status: Current Every Day Smoker    Packs/day: 1.00    Years: 20.00    Pack years: 20.00    Types: Cigarettes  . Smokeless tobacco: Never Used  . Tobacco comment: stopped smoking on 02/17/17  Substance and Sexual Activity  . Alcohol use: No    Alcohol/week: 0.0 standard drinks    Comment: Rarely.  . Drug use: No  . Sexual activity: Yes    Birth control/protection: Surgical  Other Topics Concern  . Not on file  Social History Narrative   ** Merged History Encounter **   Drinks diet Dr. Malachi Bonds about 1 a day.  Social Determinants of Health   Financial Resource Strain:   . Difficulty of Paying Living Expenses:   Food Insecurity:   . Worried About Charity fundraiser in the Last Year:   . Arboriculturist in the Last Year:   Transportation Needs:   . Film/video editor (Medical):   [name] Lack of Transportation (Non-Medical):   Physical Activity:   . Days of Exercise per Week:   . Minutes of Exercise per Session:   Stress:   . Feeling of Stress :   Social Connections:   . Frequency of Communication with Friends and Family:   . Frequency of Social Gatherings with Friends and Family:   . Attends Religious Services:   . Active Member of Clubs or Organizations:   . Attends Archivist Meetings:   [name] Marital Status:   Intimate Partner Violence:   . Fear of Current or  Ex-Partner:   . Emotionally Abused:   [name] Physically Abused:   . Sexually Abused:     ROS Review of Systems  A fourteen system review of systems was performed and found to be positive as per HPI.  Objective:   Today's Vitals: BP (!) 160/82   Pulse 72   Temp 98.2 F (36.8 C) (Oral)   Ht '5\' 6"'$  (1.676 m)   Wt 263 lb (119.3 kg)   SpO2 95% Comment: on RA  BMI 42.45 kg/m   Physical Exam General:  Well Developed, in no acute distress  Neuro:  Alert and oriented, extra-ocular muscles intact  HEENT:  Normocephalic, atraumatic, neck supple, no carotid bruits appreciated  Skin:  no Ho rash. Non-fluctuant, firm nodule of mid-abdomen without warmth or erythema is tender to palpation. Cardiac:  RRR, S1 S2 Respiratory:  ECTA B/L and A/P, Not using accessory muscles, speaking in full sentences- unlabored. Abdomen: No Ho distention.     Vascular:  Ext warm, no cyanosis apprec. Psych:  No HI/SI, judgement and insight good, Euthymic mood. Full Affect.   Assessment & Plan:   Problem List Items Addressed This Visit      Cardiovascular and Mediastinum   Hypertension associated with diabetes ([place])   Relevant Medications   Insulin Glargine (BASAGLAR KWIKPEN) 100 UNIT/ML   atorvastatin (LIPITOR) 10 MG tablet   dapagliflozin propanediol (FARXIGA) 10 MG TABS tablet   hydrochlorothiazide (HYDRODIURIL) 25 MG tablet   lisinopril (ZESTRIL) 5 MG tablet   metFORMIN (GLUCOPHAGE) 1000 MG tablet   Other Relevant Orders   Comprehensive metabolic panel (Completed)   Hemoglobin A1c (Completed)   Ambulatory referral to Ophthalmology     Endocrine   Type 2 diabetes mellitus with hyperglycemia, with long-term current use of insulin ([facility])   Relevant Medications   Insulin Glargine (BASAGLAR KWIKPEN) 100 UNIT/ML   atorvastatin (LIPITOR) 10 MG tablet   blood glucose meter kit and supplies KIT   dapagliflozin propanediol (FARXIGA) 10 MG TABS tablet   lisinopril (ZESTRIL) 5 MG tablet   metFORMIN  (GLUCOPHAGE) 1000 MG tablet   Other Relevant Orders   CBC with Differential/Platelet (Completed)   Comprehensive metabolic panel (Completed)   Hemoglobin A1c (Completed)   Lipid panel (Completed)   Ambulatory referral to Ophthalmology   Hyperlipidemia associated with type 2 diabetes mellitus ([facility])   Relevant Medications   Insulin Glargine (BASAGLAR KWIKPEN) 100 UNIT/ML   atorvastatin (LIPITOR) 10 MG tablet   dapagliflozin propanediol (FARXIGA) 10 MG TABS tablet   lisinopril (ZESTRIL) 5 MG tablet   metFORMIN (GLUCOPHAGE) 1000 MG tablet  Other Relevant Orders   Comprehensive metabolic panel (Completed)   Hemoglobin A1c (Completed)     Other   HYPERTRIGLYCERIDEMIA   Relevant Medications   atorvastatin (LIPITOR) 10 MG tablet   hydrochlorothiazide (HYDRODIURIL) 25 MG tablet   lisinopril (ZESTRIL) 5 MG tablet   Other Relevant Orders   CBC with Differential/Platelet (Completed)   Comprehensive metabolic panel (Completed)   Hemoglobin A1c (Completed)   Lipid panel (Completed)   TSH (Completed)   Depression   Relevant Medications   DULoxetine (CYMBALTA) 60 MG capsule   DULoxetine (CYMBALTA) 30 MG capsule    Other Visit Diagnoses    Encounter to establish care    -  Primary   Anxiety       Relevant Medications   DULoxetine (CYMBALTA) 60 MG capsule   DULoxetine (CYMBALTA) 30 MG capsule   Need for hepatitis C screening test       Relevant Orders   Hepatitis C antibody (Completed)   Encounter for screening mammogram for malignant neoplasm of breast       Relevant Orders   MM Digital Screening   Healthcare maintenance       Relevant Orders   CBC with Differential/Platelet (Completed)   Comprehensive metabolic panel (Completed)   Hemoglobin A1c (Completed)   Lipid panel (Completed)   TSH (Completed)   Hepatitis C antibody (Completed)   Ambulatory referral to Ophthalmology   Cyst of skin       Relevant Medications   doxycycline (VIBRA-TABS) 100 MG tablet     Healthcare  maintenance: -Placed mammogram order. -Placed orders for routine labs and hep C screening.  -Up-to-date on Tdap and pneumococcal vaccine. -Follow-up in 3 months for DM, HTN, HLD, mood   Type 2 diabetes mellitus with hyperglycemia, with long-term current use of insulin: -Per chart review, last A1c 10.4 (1 year ago). -Patient has been out of her diabetic medications so expect A1c to be uncontrolled.  A1c drawn today. -Will not make any medication changes at this time given patient has been out of medications, so will continue Metformin and Farxiga, changed Lantus to Basaglar 100 units (patient was taking 100 units of Lantus at bedtime). -Will send new prescription for glucometer and testing supplies so patient can resume ambulatory glucose monitoring. -Follow low carbohydrate and glucose diet and stay as active as possible. -Foot exam performed today WNL -Placed ophthalmology referral for diabetic eye exam.  Hypertension associated with diabetes: -BP today is 160/82 HR 98, above goal, has been out of antihypertensives -Provided refills of lisinopril 5 mg and HCTZ 25 mg. -Recommend ambulatory BP and pulse monitoring, and keep a log. -Follow DASH diet.  Hyperlipidemia associated with type 2 diabetes mellitus, hypertriglyceridemia: -Per chart review, last lipid panel: Total cholesterol elevated, triglycerides 581, HDL low at 27 -Provided refills of atorvastatin 10 mg. -Rechecking lipid panel today. -Follow heart healthy diet.  Depression, anxiety: -PHQ-9 score of 19, denies SI/HI -Patient has been out of Cymbalta 90 mg. Reports mood has been stable prior to being out of medications. -Provided refills.  Cyst of skin: -No signs of cellulitis or abscess present, but given existing comorbidities including uncontrolled diabetes and tenderness on exam will start doxycycline. -Follow-up if symptoms worsen or fail to improve.  Outpatient Encounter Medications as of 04/07/2020  Medication Sig   . atorvastatin (LIPITOR) 10 MG tablet Take 1 tablet (10 mg total) by mouth daily.  . blood glucose meter kit and supplies KIT Dispense based on patient and insurance preference. Use up to  four times daily as directed. (FOR ICD-9 250.00, 250.01).  . dapagliflozin propanediol (FARXIGA) 10 MG TABS tablet Take 1 tablet (10 mg total) by mouth daily.  . DULoxetine (CYMBALTA) 30 MG capsule Take 1 capsule (30 mg total) by mouth daily.  . DULoxetine (CYMBALTA) 60 MG capsule Take 1 capsule (60 mg total) by mouth daily.  [name] glucose blood (FREESTYLE LITE) test strip USE AS DIRECTED THREE TO FOUR TIMES DAILY  . hydrochlorothiazide (HYDRODIURIL) 25 MG tablet Take 1 tablet (25 mg total) by mouth daily.  [name] ibuprofen (ADVIL) 200 MG tablet Take 800 mg by mouth daily as needed for headache or moderate pain.  . Insulin Syringe-Needle U-100 (TRUEPLUS INSULIN SYRINGE) 30G X 5/16" 0.5 ML MISC USE AS DIRECTED EVERY DAY with insulin pen  . lisinopril (ZESTRIL) 5 MG tablet Take 1 tablet (5 mg total) by mouth daily.  . metFORMIN (GLUCOPHAGE) 1000 MG tablet Take 1 tablet (1,000 mg total) by mouth 2 (two) times daily with a meal.  . [DISCONTINUED] atorvastatin (LIPITOR) 10 MG tablet Take 1 tablet (10 mg total) by mouth daily.  . [DISCONTINUED] blood glucose meter kit and supplies KIT Dispense based on patient and insurance preference. Use up to four times daily as directed. (FOR ICD-9 250.00, 250.01).  . [DISCONTINUED] DULoxetine (CYMBALTA) 30 MG capsule Take 3 capsules by mouth daily.  . [DISCONTINUED] DULoxetine (CYMBALTA) 30 MG capsule Take 3 capsules (90 mg total) by mouth daily.  . [DISCONTINUED] DULoxetine (CYMBALTA) 60 MG capsule Take 1 capsule (60 mg total) by mouth daily.  . [DISCONTINUED] FARXIGA 10 MG TABS tablet TAKE 1 TABLET BY MOUTH ONCE DAILY  . [DISCONTINUED] hydrochlorothiazide (HYDRODIURIL) 25 MG tablet TAKE 1 TABLET (25 MG TOTAL) BY MOUTH DAILY.  . [DISCONTINUED] lisinopril (PRINIVIL,ZESTRIL) 5 MG tablet  Take 1 tablet (5 mg total) by mouth daily.  . [DISCONTINUED] metFORMIN (GLUCOPHAGE) 1000 MG tablet Take 1 tablet (1,000 mg total) by mouth 2 (two) times daily with a meal.  . doxycycline (VIBRA-TABS) 100 MG tablet Take 1 tablet (100 mg total) by mouth 2 (two) times daily.  . Insulin Glargine (BASAGLAR KWIKPEN) 100 UNIT/ML Inject 1 mL (100 Units total) into the skin at bedtime.  . Insulin Pen Needle (EASY TOUCH SAFETY PEN NEEDLES) 29G X 8MM MISC Use to inject insulin  . [DISCONTINUED] diazepam (VALIUM) 5 MG tablet Take 5 mg by mouth 2 (two) times daily as needed.  . [DISCONTINUED] doxycycline (VIBRAMYCIN) 100 MG capsule Take 1 capsule (100 mg total) by mouth 2 (two) times daily.  . [DISCONTINUED] DULoxetine (CYMBALTA) 30 MG capsule Take 1 capsule (30 mg total) by mouth daily.  . [DISCONTINUED] gabapentin (NEURONTIN) 300 MG capsule Take 1 tablet at bedtime X 3 days, then increase to twice daily X 3 days, then increase to three times daily  . [DISCONTINUED] insulin glargine (LANTUS) 100 UNIT/ML injection Inject 1 mL (100 Units total) into the skin daily. (Patient not taking: Reported on 04/07/2020)  . [DISCONTINUED] meloxicam (MOBIC) 15 MG tablet Take 15 mg by mouth daily.  . [DISCONTINUED] methocarbamol (ROBAXIN) 500 MG tablet Take 500 mg by mouth every 6 (six) hours as needed.  . [DISCONTINUED] oxyCODONE-acetaminophen (PERCOCET) 5-325 MG tablet Take 2 tablets by mouth every 4 (four) hours as needed for severe pain.  . [DISCONTINUED] predniSONE (DELTASONE) 20 MG tablet Take 2 tablets (40 mg total) by mouth daily.   No facility-administered encounter medications on file as of 04/07/2020.    Follow-up: Return in about 3 months (around 07/08/2020) for  DM, HTN, HLD, Mood.   [name], PA-C

## 2020-04-08 LAB — COMPREHENSIVE METABOLIC PANEL
ALT: 10 IU/L (ref 0–32)
AST: 13 IU/L (ref 0–40)
Albumin/Globulin Ratio: 1.4 (ref 1.2–2.2)
Albumin: 4.1 g/dL (ref 3.8–4.8)
Alkaline Phosphatase: 81 IU/L (ref 48–121)
BUN/Creatinine Ratio: 20 (ref 9–23)
BUN: 14 mg/dL (ref 6–24)
Bilirubin Total: 0.4 mg/dL (ref 0.0–1.2)
CO2: 23 mmol/L (ref 20–29)
Calcium: 9.2 mg/dL (ref 8.7–10.2)
Chloride: 99 mmol/L (ref 96–106)
Creatinine, Ser: 0.71 mg/dL (ref 0.57–1.00)
GFR calc Af Amer: 118 mL/min/{1.73_m2} (ref >59)
GFR calc non Af Amer: 102 mL/min/{1.73_m2} (ref >59)
Globulin, Total: 2.9 g/dL (ref 1.5–4.5)
Glucose: 336 mg/dL — ABNORMAL HIGH (ref 65–99)
Potassium: 4.2 mmol/L (ref 3.5–5.2)
Sodium: 135 mmol/L (ref 134–144)
Total Protein: 7 g/dL (ref 6.0–8.5)

## 2020-04-08 LAB — CBC WITH DIFFERENTIAL/PLATELET
Basophils Absolute: 0.1 10*3/uL (ref 0.0–0.2)
Basos: 1 %
EOS (ABSOLUTE): 0.1 10*3/uL (ref 0.0–0.4)
Eos: 2 %
Hematocrit: 50.4 % — ABNORMAL HIGH (ref 34.0–46.6)
Hemoglobin: 17 g/dL — ABNORMAL HIGH (ref 11.1–15.9)
Immature Grans (Abs): 0.1 10*3/uL (ref 0.0–0.1)
Immature Granulocytes: 1 %
Lymphocytes Absolute: 1.9 10*3/uL (ref 0.7–3.1)
Lymphs: 28 %
MCH: 30.5 pg (ref 26.6–33.0)
MCHC: 33.7 g/dL (ref 31.5–35.7)
MCV: 91 fL (ref 79–97)
Monocytes Absolute: 0.5 10*3/uL (ref 0.1–0.9)
Monocytes: 8 %
Neutrophils Absolute: 4 10*3/uL (ref 1.4–7.0)
Neutrophils: 60 %
Platelets: 134 10*3/uL — ABNORMAL LOW (ref 150–450)
RBC: 5.57 x10E6/uL — ABNORMAL HIGH (ref 3.77–5.28)
RDW: 13.1 % (ref 11.7–15.4)
WBC: 6.7 10*3/uL (ref 3.4–10.8)

## 2020-04-08 LAB — LIPID PANEL
Chol/HDL Ratio: 9 ratio — ABNORMAL HIGH (ref 0.0–4.4)
Cholesterol, Total: 208 mg/dL — ABNORMAL HIGH (ref 100–199)
HDL: 23 mg/dL — ABNORMAL LOW (ref >39)
LDL Chol Calc (NIH): 82 mg/dL (ref 0–99)
Triglycerides: 634 mg/dL (ref 0–149)
VLDL Cholesterol Cal: 103 mg/dL — ABNORMAL HIGH (ref 5–40)

## 2020-04-08 LAB — TSH: TSH: 2.11 u[IU]/mL (ref 0.450–4.500)

## 2020-04-08 LAB — HEMOGLOBIN A1C
Est. average glucose Bld gHb Est-mCnc: 275 mg/dL
Hgb A1c MFr Bld: 11.2 % — ABNORMAL HIGH (ref 4.8–5.6)

## 2020-04-08 LAB — HEPATITIS C ANTIBODY: Hep C Virus Ab: <0.1 s/co ratio (ref 0.0–0.9)

## 2020-04-14 DIAGNOSIS — E1169 Type 2 diabetes mellitus with other specified complication: Secondary | ICD-10-CM | POA: Insufficient documentation

## 2020-04-14 DIAGNOSIS — E785 Hyperlipidemia, unspecified: Secondary | ICD-10-CM | POA: Insufficient documentation

## 2020-04-14 DIAGNOSIS — E782 Mixed hyperlipidemia: Secondary | ICD-10-CM | POA: Insufficient documentation

## 2020-04-22 ENCOUNTER — Ambulatory Visit
Admission: RE | Admit: 2020-04-22 | Discharge: 2020-04-22 | Disposition: A | Discharge status: Home / Self Care | Payer: No Typology Code available for payment source | Source: Ambulatory Visit | Attending: Physician Assistant | Admitting: Physician Assistant

## 2020-04-22 ENCOUNTER — Other Ambulatory Visit: Payer: Self-pay

## 2020-04-22 DIAGNOSIS — Z1231 Encounter for screening mammogram for malignant neoplasm of breast: Secondary | ICD-10-CM

## 2020-04-22 MED FILL — BASAGLAR 100 UNIT/ML KWIKPE: 100 | 30 days supply | Qty: 30 | Fill #1

## 2020-04-24 ENCOUNTER — Other Ambulatory Visit: Payer: Self-pay | Admitting: Physician Assistant

## 2020-04-24 ENCOUNTER — Other Ambulatory Visit: Payer: Self-pay

## 2020-04-24 DIAGNOSIS — E1165 Type 2 diabetes mellitus with hyperglycemia: Secondary | ICD-10-CM

## 2020-04-24 MED ORDER — BASAGLAR KWIKPEN 100 UNIT/ML ~~LOC~~ SOPN: 100.0000 [IU] | PEN_INJECTOR | Freq: Every day | SUBCUTANEOUS | 3 refills | Status: DC

## 2020-04-24 NOTE — Telephone Encounter (Signed)
Received fax from [facility] stating that previous RX for Renee Ho was only for a 15 day supply.  Pharmacy is requesting a 30 day supply with refills.  New RX sent to pharmacy.  Renee Ho, CMA

## 2020-04-28 ENCOUNTER — Telehealth: Payer: Self-pay | Admitting: Physician Assistant

## 2020-04-28 MED ORDER — OMEGA-3-ACID ETHYL ESTERS 1 G PO CAPS
2.0000 g | ORAL_CAPSULE | Freq: Two times a day (BID) | ORAL | 1 refills | Status: DC
Start: 2020-04-28 — End: 2020-04-29

## 2020-04-28 MED FILL — OMEGA-3 ETHYL ESTERS 1 GM C: 1 | 30 days supply | Qty: 120 | Fill #0

## 2020-04-28 NOTE — Telephone Encounter (Signed)
Sent rx for Lovaza. She should continue Lipitor as well.   Thank you, Kandis Cocking

## 2020-04-28 NOTE — Telephone Encounter (Signed)
Rx sent for Lovaza

## 2020-04-28 NOTE — Telephone Encounter (Signed)
Patient states to tell Maritza it's okay to change cholestorol medicine.

## 2020-04-28 NOTE — Telephone Encounter (Signed)
Message being forwarded to [facility]. AS, CMA

## 2020-04-28 NOTE — Telephone Encounter (Signed)
Called patient and left message to call back regarding below. AS< CMA

## 2020-04-29 ENCOUNTER — Other Ambulatory Visit: Payer: Self-pay | Admitting: Physician Assistant

## 2020-04-29 MED ORDER — OMEGA-3-ACID ETHYL ESTERS 1 G PO CAPS: 2.0000 g | ORAL_CAPSULE | Freq: Two times a day (BID) | ORAL | 1 refills | Status: DC

## 2020-04-29 NOTE — Telephone Encounter (Signed)
Patient is aware of the below and verbalized understanding. AS, CMA 

## 2020-05-08 MED FILL — FLUOROMETHOLONE 0.1% DROPS: 0.1 | 13 days supply | Qty: 5 | Fill #0

## 2020-05-13 ENCOUNTER — Other Ambulatory Visit: Payer: Self-pay

## 2020-05-13 ENCOUNTER — Other Ambulatory Visit: Payer: No Typology Code available for payment source

## 2020-05-13 DIAGNOSIS — Z719 Counseling, unspecified: Secondary | ICD-10-CM

## 2020-05-13 DIAGNOSIS — R739 Hyperglycemia, unspecified: Secondary | ICD-10-CM

## 2020-05-13 DIAGNOSIS — E781 Pure hyperglyceridemia: Secondary | ICD-10-CM

## 2020-05-13 DIAGNOSIS — E1169 Type 2 diabetes mellitus with other specified complication: Secondary | ICD-10-CM

## 2020-05-13 DIAGNOSIS — E1165 Type 2 diabetes mellitus with hyperglycemia: Secondary | ICD-10-CM

## 2020-05-13 DIAGNOSIS — I152 Hypertension secondary to endocrine disorders: Secondary | ICD-10-CM

## 2020-05-13 DIAGNOSIS — E1159 Type 2 diabetes mellitus with other circulatory complications: Secondary | ICD-10-CM

## 2020-05-14 LAB — COMPREHENSIVE METABOLIC PANEL
ALT: 11 IU/L (ref 0–32)
AST: 13 IU/L (ref 0–40)
Albumin/Globulin Ratio: 1.7 (ref 1.2–2.2)
Albumin: 4.6 g/dL (ref 3.8–4.8)
Alkaline Phosphatase: 64 IU/L (ref 48–121)
BUN/Creatinine Ratio: 20 (ref 9–23)
BUN: 16 mg/dL (ref 6–24)
Bilirubin Total: 0.4 mg/dL (ref 0.0–1.2)
CO2: 21 mmol/L (ref 20–29)
Calcium: 9.9 mg/dL (ref 8.7–10.2)
Chloride: 98 mmol/L (ref 96–106)
Creatinine, Ser: 0.79 mg/dL (ref 0.57–1.00)
GFR calc Af Amer: 104 mL/min/{1.73_m2} (ref >59)
GFR calc non Af Amer: 90 mL/min/{1.73_m2} (ref >59)
Globulin, Total: 2.7 g/dL (ref 1.5–4.5)
Glucose: 268 mg/dL — ABNORMAL HIGH (ref 65–99)
Potassium: 4.1 mmol/L (ref 3.5–5.2)
Sodium: 136 mmol/L (ref 134–144)
Total Protein: 7.3 g/dL (ref 6.0–8.5)

## 2020-05-14 LAB — HEMOGLOBIN A1C
Est. average glucose Bld gHb Est-mCnc: 243 mg/dL
Hgb A1c MFr Bld: 10.1 % — ABNORMAL HIGH (ref 4.8–5.6)

## 2020-05-14 LAB — LIPID PANEL
Chol/HDL Ratio: 5.6 ratio — ABNORMAL HIGH (ref 0.0–4.4)
Cholesterol, Total: 174 mg/dL (ref 100–199)
HDL: 31 mg/dL — ABNORMAL LOW (ref >39)
LDL Chol Calc (NIH): 98 mg/dL (ref 0–99)
Triglycerides: 264 mg/dL — ABNORMAL HIGH (ref 0–149)
VLDL Cholesterol Cal: 45 mg/dL — ABNORMAL HIGH (ref 5–40)

## 2020-05-14 LAB — TSH: TSH: 1.64 u[IU]/mL (ref 0.450–4.500)

## 2020-05-14 LAB — CBC
Hematocrit: 53.2 % — ABNORMAL HIGH (ref 34.0–46.6)
Hemoglobin: 17.9 g/dL — ABNORMAL HIGH (ref 11.1–15.9)
MCH: 30.2 pg (ref 26.6–33.0)
MCHC: 33.6 g/dL (ref 31.5–35.7)
MCV: 90 fL (ref 79–97)
Platelets: 167 10*3/uL (ref 150–450)
RBC: 5.92 x10E6/uL — ABNORMAL HIGH (ref 3.77–5.28)
RDW: 14 % (ref 11.7–15.4)
WBC: 7.8 10*3/uL (ref 3.4–10.8)

## 2020-05-19 MED FILL — FLUOROMETHOLONE 0.1% DROPS: 0.1 | 25 days supply | Qty: 10 | Fill #0

## 2020-05-30 MED FILL — BASAGLAR 100 UNIT/ML KWIKPE: 100 | 30 days supply | Qty: 30 | Fill #0

## 2020-06-09 ENCOUNTER — Ambulatory Visit
Admission: EM | Admit: 2020-06-09 | Discharge: 2020-06-09 | Discharge status: Absent without leave | Payer: No Typology Code available for payment source

## 2020-06-10 ENCOUNTER — Ambulatory Visit: Payer: Self-pay

## 2020-07-08 ENCOUNTER — Other Ambulatory Visit: Payer: Self-pay

## 2020-07-08 ENCOUNTER — Ambulatory Visit (INDEPENDENT_AMBULATORY_CARE_PROVIDER_SITE_OTHER): Payer: No Typology Code available for payment source | Admitting: Physician Assistant

## 2020-07-08 ENCOUNTER — Encounter: Payer: Self-pay | Admitting: Physician Assistant

## 2020-07-08 VITALS — BP 144/78 | HR 84 | Ht 66.0 in | Wt 268.8 lb

## 2020-07-08 DIAGNOSIS — G4733 Obstructive sleep apnea (adult) (pediatric): Secondary | ICD-10-CM | POA: Diagnosis not present

## 2020-07-08 DIAGNOSIS — I152 Hypertension secondary to endocrine disorders: Secondary | ICD-10-CM

## 2020-07-08 DIAGNOSIS — E1159 Type 2 diabetes mellitus with other circulatory complications: Secondary | ICD-10-CM

## 2020-07-08 DIAGNOSIS — E785 Hyperlipidemia, unspecified: Secondary | ICD-10-CM

## 2020-07-08 DIAGNOSIS — Z794 Long term (current) use of insulin: Secondary | ICD-10-CM

## 2020-07-08 DIAGNOSIS — I10 Essential (primary) hypertension: Secondary | ICD-10-CM

## 2020-07-08 DIAGNOSIS — E1165 Type 2 diabetes mellitus with hyperglycemia: Secondary | ICD-10-CM

## 2020-07-08 DIAGNOSIS — L729 Follicular cyst of the skin and subcutaneous tissue, unspecified: Secondary | ICD-10-CM

## 2020-07-08 DIAGNOSIS — E1169 Type 2 diabetes mellitus with other specified complication: Secondary | ICD-10-CM | POA: Diagnosis not present

## 2020-07-08 LAB — POCT UA - MICROALBUMIN
Albumin/Creatinine Ratio, Urine, POC: <30
Creatinine, POC: 50 mg/dL
Microalbumin Ur, POC: 10 mg/L

## 2020-07-08 LAB — POCT GLYCOSYLATED HEMOGLOBIN (HGB A1C): Hemoglobin A1C: 8.9 % — AB (ref 4.0–5.6)

## 2020-07-08 NOTE — Assessment & Plan Note (Signed)
-  Placed referral for new sleep study.

## 2020-07-08 NOTE — Assessment & Plan Note (Signed)
-  BP stable -Continue current medication regimen. -Follow a low sodium diet. -Will continue to monitor. 

## 2020-07-08 NOTE — Progress Notes (Signed)
Established Patient Office Visit  Subjective:  Patient ID: Renee Ho, female    DOB: 1976/02/02  Age: 44 y.o. MRN: [id number]  CC:  Chief Complaint  Patient presents with  . Diabetes  . Hypertension  . Hyperlipidemia    HPI Renee Ho presents for diabetes mellitus, hypertension and hyperlipidemia.  Diabetes: Pt denies increased urination or thirst. Pt reports medication compliance. No hypoglycemic events. Checking glucose at home. Fasting blood sugars are usually less than 200. Reports she has made dietary changes and reduced sweets. She tries to stay as active as possible.  HTN: Pt denies chest pain, palpitations, dizziness or lower extremity swelling. Taking medication as directed without side effects.  Occasionally checks blood pressure at work and reports it is fine.  HLD: Pt taking medication as directed without issues. Denies side effects.  OSA: Reports it has been several years since her last sleep study and is starting to experience increased fatigue and more irritable. She does not get a good restful night. Thinks her device settings might need to be adjusted.  Abdominal skin cyst: Continues to have intermittent drainage from cyst.  No fever or chills.    Past Medical History:  Diagnosis Date  . Depression   . Diabetes mellitus   . Diabetes mellitus without complication ([place])   . Diverticula of intestine   . High cholesterol   . Hypertension   . OSA on CPAP    CPAP pressure= 15  . Polycystic ovarian syndrome   . Sleep apnea    on CPAP with pressure setting= 15    Past Surgical History:  Procedure Laterality Date  . ABDOMINAL HYSTERECTOMY    . CYST REMOVAL NECK    . KNEE ARTHROSCOPY  07/11/2012   Procedure: ARTHROSCOPY KNEE;  Surgeon: Meredith Pel, MD;  Location: [facility];  Service: Orthopedics;  Laterality: Right;  Right knee arthroscopy with debridement  . KNEE SURGERY    . OVARIAN CYST REMOVAL    . WISDOM TOOTH  EXTRACTION  05/11/2017    Family History  Problem Relation Age of Onset  . Cancer Mother        breast  . Mental illness Mother   . Depression Mother   . Hyperlipidemia Mother   . Breast cancer Mother   . Healthy Father   . Hyperlipidemia Father   . Cancer Maternal Grandfather        unknown type  . Depression Maternal Grandfather   . Hyperlipidemia Maternal Grandfather   . Stroke Paternal Grandmother   . Hyperlipidemia Paternal Grandmother   . Diabetes Maternal Grandmother   . Hyperlipidemia Maternal Grandmother   . Hyperlipidemia Paternal Grandfather     Social History   Socioeconomic History  . Marital status: Married    Spouse name: Not on file  . Number of children: 1  . Years of education: College  . Highest education level: Not on file  Occupational History  . Not on file  Tobacco Use  . Smoking status: Current Every Day Smoker    Packs/day: 1.00    Years: 20.00    Pack years: 20.00    Types: Cigarettes  . Smokeless tobacco: Never Used  . Tobacco comment: stopped smoking on 02/17/17  Substance and Sexual Activity  . Alcohol use: No    Alcohol/week: 0.0 standard drinks    Comment: Rarely.  . Drug use: No  . Sexual activity: Yes    Birth control/protection: Surgical  Other Topics Concern  . Not  on file  Social History Narrative   ** Merged History Encounter **   Drinks diet Dr. Malachi Bonds about 1 a day.        Social Determinants of Health   Financial Resource Strain:   . Difficulty of Paying Living Expenses: Not on file  Food Insecurity:   . Worried About Charity fundraiser in the Last Year: Not on file  . Ran Out of Food in the Last Year: Not on file  Transportation Needs:   . Lack of Transportation (Medical): Not on file  . Lack of Transportation (Non-Medical): Not on file  Physical Activity:   . Days of Exercise per Week: Not on file  . Minutes of Exercise per Session: Not on file  Stress:   . Feeling of Stress : Not on file  Social  Connections:   . Frequency of Communication with Friends and Family: Not on file  . Frequency of Social Gatherings with Friends and Family: Not on file  . Attends Religious Services: Not on file  . Active Member of Clubs or Organizations: Not on file  . Attends Archivist Meetings: Not on file  . Marital Status: Not on file  Intimate Partner Violence:   . Fear of Current or Ex-Partner: Not on file  . Emotionally Abused: Not on file  . Physically Abused: Not on file  . Sexually Abused: Not on file    Outpatient Medications Prior to Visit  Medication Sig Dispense Refill  . atorvastatin (LIPITOR) 10 MG tablet Take 1 tablet (10 mg total) by mouth daily. 90 tablet 1  . blood glucose meter kit and supplies KIT Dispense based on patient and insurance preference. Use up to four times daily as directed. (FOR ICD-9 250.00, 250.01). 1 each 0  . dapagliflozin propanediol (FARXIGA) 10 MG TABS tablet Take 1 tablet (10 mg total) by mouth daily. 90 tablet 1  . DULoxetine (CYMBALTA) 30 MG capsule Take 1 capsule (30 mg total) by mouth daily. 90 capsule 0  . DULoxetine (CYMBALTA) 60 MG capsule Take 1 capsule (60 mg total) by mouth daily. 90 capsule 1  . glucose blood (FREESTYLE LITE) test strip USE AS DIRECTED THREE TO FOUR TIMES DAILY 100 each 1  . hydrochlorothiazide (HYDRODIURIL) 25 MG tablet Take 1 tablet (25 mg total) by mouth daily. 90 tablet 1  . ibuprofen (ADVIL) 200 MG tablet Take 800 mg by mouth daily as needed for headache or moderate pain.    . Insulin Glargine (BASAGLAR KWIKPEN) 100 UNIT/ML Inject 1 mL (100 Units total) into the skin at bedtime. 30 mL 3  . Insulin Pen Needle (EASY TOUCH SAFETY PEN NEEDLES) 29G X 8MM MISC Use to inject insulin 100 each 3  . Insulin Syringe-Needle U-100 (TRUEPLUS INSULIN SYRINGE) 30G X 5/16" 0.5 ML MISC USE AS DIRECTED EVERY DAY with insulin pen 100 each 3  . lisinopril (ZESTRIL) 5 MG tablet Take 1 tablet (5 mg total) by mouth daily. 90 tablet 1  .  metFORMIN (GLUCOPHAGE) 1000 MG tablet Take 1 tablet (1,000 mg total) by mouth 2 (two) times daily with a meal. 180 tablet 1  . omega-3 acid ethyl esters (LOVAZA) 1 g capsule Take 2 capsules (2 g total) by mouth 2 (two) times daily. 120 capsule 1  . doxycycline (VIBRA-TABS) 100 MG tablet Take 1 tablet (100 mg total) by mouth 2 (two) times daily. (Patient not taking: Reported on 07/08/2020) 20 tablet 0   No facility-administered medications prior to visit.  Allergies  Allergen Reactions  . Morphine And Related Other (See Comments)    Migraines  . Reglan [Metoclopramide] Other (See Comments)    Psychotic episodes  . Toradol [Ketorolac Tromethamine] Rash    ROS Review of Systems  A fourteen system review of systems was performed and found to be positive as per HPI.   Objective:    Physical Exam General:  Well Developed, well nourished, appropriate for stated age.  Neuro:  Alert and oriented,  extra-ocular muscles intact, no focal deficits  HEENT:  Normocephalic, atraumatic Skin:  no gross rash, warm, pink. Cardiac:  RRR, S1 S2 Respiratory:  ECTA B/L, Not using accessory muscles, speaking in full sentences- unlabored. Vascular:  Ext warm, no cyanosis apprec.; cap RF less 2 sec. Psych:  No HI/SI, judgement and insight good, Euthymic mood. Full Affect.  BP (!) 144/78   Pulse 84   Ht _0  (1.676 m)   Wt 268 lb 12.8 oz (121.9 kg)   SpO2 96%   BMI 43.39 kg/m  Wt Readings from Last 3 Encounters:  07/08/20 268 lb 12.8 oz (121.9 kg)  04/07/20 263 lb (119.3 kg)  12/24/19 276 lb 6.4 oz (125.4 kg)     Health Maintenance Due  Topic Date Due  . COVID-19 Vaccine (1) Never done  . OPHTHALMOLOGY EXAM  08/31/2019  . INFLUENZA VACCINE  05/11/2020    There are no preventive care reminders to display for this patient.  Lab Results  Component Value Date   TSH 1.640 05/13/2020   Lab Results  Component Value Date   WBC 7.8 05/13/2020   HGB 17.9 (H) 05/13/2020   HCT 53.2 (H)  05/13/2020   MCV 90 05/13/2020   PLT 167 05/13/2020   Lab Results  Component Value Date   NA 136 05/13/2020   K 4.1 05/13/2020   CO2 21 05/13/2020   GLUCOSE 268 (H) 05/13/2020   BUN 16 05/13/2020   CREATININE 0.79 05/13/2020   BILITOT 0.4 05/13/2020   ALKPHOS 64 05/13/2020   AST 13 05/13/2020   ALT 11 05/13/2020   PROT 7.3 05/13/2020   ALBUMIN 4.6 05/13/2020   CALCIUM 9.9 05/13/2020   ANIONGAP 10 12/22/2016   Lab Results  Component Value Date   CHOL 174 05/13/2020   Lab Results  Component Value Date   HDL 31 (L) 05/13/2020   Lab Results  Component Value Date   LDLCALC 98 05/13/2020   Lab Results  Component Value Date   TRIG 264 (H) 05/13/2020   Lab Results  Component Value Date   CHOLHDL 5.6 (H) 05/13/2020   Lab Results  Component Value Date   HGBA1C 8.9 (A) 07/08/2020      Assessment & Plan:   Problem List Items Addressed This Visit      Cardiovascular and Mediastinum   Hypertension associated with diabetes ([place])    -BP stable -Continue current medication regimen. -Follow a low sodium diet. -Will continue to monitor.        Respiratory   Sleep apnea    -Placed referral for new sleep study.      Relevant Orders   Ambulatory referral to Sleep Studies     Endocrine   Type 2 diabetes mellitus with hyperglycemia, with long-term current use of insulin ([facility]) - Primary    -A1c today is 8.9, decreased from 10.1 so will continue current medication regimen.  Discussed with patient adjusting medications if A1c continues to be above goal next OV. -Follow low carbohydrate and glucose  diet. -Continue to stay as active as possible. -UA microalbumin performed today, normal A:C <30 mg/g -Will continue to monitor.      Relevant Orders   POCT glycosylated hemoglobin (Hb A1C) (Completed)   POCT UA - Microalbumin (Completed)   Hyperlipidemia associated with type 2 diabetes mellitus ([place])    -Last lipid panel elevated but improved from prior. -Continue  Lipitor and lovaza. -Follow a heart healthy diet low in saturated and trans fats. -Will continue to monitor.        Other   Morbid obesity ([place])    Other Visit Diagnoses    Cyst of skin       Relevant Orders   Ambulatory referral to Dermatology     Skin cyst: -Placed dermatology referral for skin lesion removal.  Morbid obesity: -Associated with diabetes, hypertension, and hyperlipidemia. -Encourage to continue with dietary changes and continue to stay as active as possible. Recommend behavioral and lifestyle modifications.   No orders of the defined types were placed in this encounter.   Follow-up: Return in about 3 months (around 10/07/2020) for DM, HTN, Mood.   Note:  This note was prepared with assistance of Dragon voice recognition software. Occasional wrong-word or sound-a-like substitutions may have occurred due to the inherent limitations of voice recognition software.  [name], PA-C

## 2020-07-08 NOTE — Assessment & Plan Note (Addendum)
-  A1c today is 8.9, decreased from 10.1 so will continue current medication regimen.  Discussed with patient adjusting medications if A1c continues to be above goal next OV. -Follow low carbohydrate and glucose diet. -Continue to stay as active as possible. -UA microalbumin performed today, normal A:C <30 mg/g -Will continue to monitor.

## 2020-07-08 NOTE — Patient Instructions (Signed)

## 2020-07-08 NOTE — Assessment & Plan Note (Deleted)
-  A1c today is 8.9, decreased from 10.1 so will continue current medication regimen.  Discussed with patient adjusting medications if A1c continues to be above goal next OV. -Follow low carbohydrate and glucose diet. -Continue to stay as active as possible. -Will continue to monitor.

## 2020-07-08 NOTE — Assessment & Plan Note (Signed)
-  Last lipid panel elevated but improved from prior. -Continue Lipitor and lovaza. -Follow a heart healthy diet low in saturated and trans fats. -Will continue to monitor.

## 2020-07-22 ENCOUNTER — Other Ambulatory Visit: Payer: Self-pay | Admitting: Physician Assistant

## 2020-07-22 DIAGNOSIS — F32A Depression, unspecified: Secondary | ICD-10-CM

## 2020-07-22 MED FILL — FARXIGA 10 MG TABLET: 10 | 90 days supply | Qty: 90 | Fill #1

## 2020-07-22 MED FILL — METFORMIN HCL 1000 MG TABS: 1000 | 90 days supply | Qty: 180 | Fill #1

## 2020-07-22 MED FILL — OMEGA-3 ETHYL ESTERS 1 GM C: 1 | 30 days supply | Qty: 120 | Fill #0

## 2020-07-22 MED FILL — BASAGLAR 100 UNIT/ML KWIKPE: 100 | 30 days supply | Qty: 30 | Fill #1

## 2020-07-22 MED FILL — DULoxetine HCL 60 MG CPEP: 60 | 90 days supply | Qty: 90 | Fill #1

## 2020-07-22 MED FILL — LISINOPRIL 5 MG TABS: 5 | 90 days supply | Qty: 90 | Fill #1

## 2020-07-22 MED FILL — HYDROCHLOROTHIAZIDE 25 MG T: 25 | 90 days supply | Qty: 90 | Fill #1

## 2020-07-22 MED FILL — DULoxetine HCL 30 MG CPEP: 30 | 90 days supply | Qty: 90 | Fill #0

## 2020-08-12 ENCOUNTER — Encounter: Payer: Self-pay | Admitting: Neurology

## 2020-08-12 ENCOUNTER — Ambulatory Visit: Payer: No Typology Code available for payment source | Admitting: Neurology

## 2020-08-12 VITALS — BP 152/88 | HR 85 | Ht 65.0 in | Wt 264.0 lb

## 2020-08-12 DIAGNOSIS — R519 Headache, unspecified: Secondary | ICD-10-CM | POA: Diagnosis not present

## 2020-08-12 DIAGNOSIS — G4719 Other hypersomnia: Secondary | ICD-10-CM

## 2020-08-12 DIAGNOSIS — G4733 Obstructive sleep apnea (adult) (pediatric): Secondary | ICD-10-CM | POA: Diagnosis not present

## 2020-08-12 DIAGNOSIS — Z9989 Dependence on other enabling machines and devices: Secondary | ICD-10-CM

## 2020-08-12 NOTE — Progress Notes (Signed)
Subjective:    Patient ID: Renee Ho is a 44 y.o. female.  HPI     Star Age, MD, PhD [facility] [address]O. [place], [place] [address]  Dear Renee Ho,   I saw your patient, Renee Ho, upon your kind request, in my sleep clinic today for re-evaluation of her obstructive sleep apnea.  The patient is unaccompanied today.  As you know, [name] is a 44 year old right-handed woman with an underlying medical history of PCOS, hypertension, hyperlipidemia, diabetes, depression, and severe obesity with a BMI of over 40, who was previously diagnosed with obstructive sleep apnea.  Sleep study testing originally was about 10 years ago.  I evaluated her about 5 years ago for her sleep apnea and she had a split-night sleep study through our sleep lab on 08/03/2015 which indicated severe obstructive sleep apnea with a baseline AHI of 44/h, O2 nadir of 81%.  Effective CPAP was 15 cm via medium full facemask.  She did not return for follow-up since then. She reports interim increase in fatigue and nonrestorative sleep. Her Epworth sleepiness score is 12 out of 24, fatigue severity score is 60 out of 63. Of note, she is on Cymbalta 90 mg daily.  She reports that she has been on the same dose of Cymbalta for about 19 years.  Several months ago, she went through a significant bout of depression and also had significant weight increase at the time to up to 296 pounds.  She had come off of her antidepressant medication and did not do well at all without it.  She reports that she currently would not be able to come off of her antidepressant medication.  I explained to her that if we wanted to exclude narcolepsy as an underlying possibility for her sleepiness then we would have to taper her psychotropic medication.  She is fully compliant with her CPAP and also naps with it.  She takes a nap every day.  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  She goes to bed typically between 10 and 11 and rise time is between 6 and 7.  She does not have night to night nocturia but has had occasional morning headaches, more frequently in the recent past.  She is trying to lose weight.  She has been able to lose weight and she is also working on smoking cessation, has reduced from 2 packs/day to currently half a pack per day.  She does not drink alcohol.  She drinks caffeine in the form of Dr. Malachi Bonds diet soda, 4 cans/day on average.  She is married and lives with her husband.  She has a grown daughter age 82.  She would like to get a new CPAP machine.  She got a new mask about 2 months ago but needs a new prescription for supplies.  After she had sleep study testing through our lab in 2016 she had never actually did get a new machine, reports that she was not eligible at the time, per insurance.  I reviewed your office note from 07/08/2020.   I reviewed her CPAP compliance data from 07/13/2020 through 08/11/2020, which is a total of 30 days, during which time she used her CPAP every night with percent use days greater than 4 hours at 100%, indicating superb compliance with an average usage of 9 hours and 51 minutes, pressure at 15 cm with EPR of 2, she has an S9 Elite CPAP, set up date  09/23/2014 from what we can see in the electronic records through the [facility] website.  Average AHI at goal at 1.2/h, leak very high with a 95th percentile at 49.8 L/min. She has benefited from CPAP therapy and is motivated to continue with treatment but needs new equipment and new supplies.  Previously:   06/24/15: 44 year old right-handed woman with an underlying medical history of hypertension, morbid obesity, diverticulitis, depression, plantar fasciitis, hyper lipidemia, type 2 diabetes, PCOS, and smoking, who was diagnosed with obstructive sleep apnea several years ago, probably over 6 years ago and has been on CPAP treatment. She reports compliance with treatment but  she has not had a reevaluation in over 6 years and her machine is not working properly. Prior sleep test results were reviewed, today, where available. It looks like she had a CPAP titration study on 09/17/2010, interpreted by Dr. Baird Lyons: Sleep efficiency was 97.2%, REM latency 75.5 minutes, CPAP was titrated to 15 cm with AHI of 0 per hour. She had a large comfort gel fullface mask. Average oxygen saturation was 94.8%. She was apparently diagnosed with severe obstructive sleep apnea in 2005. Her study results from 2011 mention a previous RDI from 08/31/2004 at 178.5 per hour and CPAP titration to 11 cm. She weighed 280 pounds at the time of her original diagnosis and her weight during the sleep study in 2011 was 278. I reviewed your office note from 06/18/2015.   She reports morning HAs for the past 2 years, about 2-3 times per week, dull, achy and bitemporal. She goes to bed around 10 PM and uses her CPAP regularly. She has not had a change in her mask in years and the machine is old intense to shut off in the early morning hours. She wakes up gasping for air and has residual snoring through the machine. She reports nocturia once on an average night. She has some restless leg symptoms and feels and comfortable sensation in her legs but denies any numbness or tingling or history of diabetic neuropathy. She drinks alcohol very rarely, perhaps once a year and smokes half pack per day on average, she drinks caffeine occasionally, not daily. She works 12 hour day shifts at [employer] as a CNA in rehabilitation. Her rise time is usually 6 AM. She has excessive daytime somnolence and does not feel rested. Her Epworth sleepiness score is 17 out of 24, her fatigue score is 58 out of 63. There is no official diagnosis of sleep apnea in her family but her father snores and has breathing pauses but has not been tested for OSA. She lives with her fianc. She has 1 child. She reports an improvement in her A1c  to 7.1. He used to be around 10.   Her Past Medical History Is Significant For: Past Medical History:  Diagnosis Date  . Depression   . Diabetes mellitus   . Diabetes mellitus without complication ([place])   . Diverticula of intestine   . High cholesterol   . Hypertension   . OSA on CPAP    CPAP pressure= 15  . Polycystic ovarian syndrome   . Sleep apnea    on CPAP with pressure setting= 15    Her Past Surgical History Is Significant For: Past Surgical History:  Procedure Laterality Date  . ABDOMINAL HYSTERECTOMY    . CYST REMOVAL NECK    . KNEE ARTHROSCOPY  07/11/2012   Procedure: ARTHROSCOPY KNEE;  Surgeon: Meredith Pel, MD;  Location: [place];  Service:  Orthopedics;  Laterality: Right;  Right knee arthroscopy with debridement  . KNEE SURGERY    . OVARIAN CYST REMOVAL    . WISDOM TOOTH EXTRACTION  05/11/2017    Her Family History Is Significant For: Family History  Problem Relation Age of Onset  . Cancer Mother        breast  . Mental illness Mother   . Depression Mother   . Hyperlipidemia Mother   . Breast cancer Mother   . Healthy Father   . Hyperlipidemia Father   . Cancer Maternal Grandfather        unknown type  . Depression Maternal Grandfather   . Hyperlipidemia Maternal Grandfather   . Stroke Paternal Grandmother   . Hyperlipidemia Paternal Grandmother   . Diabetes Maternal Grandmother   . Hyperlipidemia Maternal Grandmother   . Hyperlipidemia Paternal Grandfather     Her Social History Is Significant For: Social History   Socioeconomic History  . Marital status: Married    Spouse name: Not on file  . Number of children: 1  . Years of education: College  . Highest education level: Not on file  Occupational History  . Not on file  Tobacco Use  . Smoking status: Current Every Day Smoker    Packs/day: 1.00    Years: 20.00    Pack years: 20.00    Types: Cigarettes  . Smokeless tobacco: Never Used  . Tobacco comment: stopped smoking on  02/17/17  Substance and Sexual Activity  . Alcohol use: No    Alcohol/week: 0.0 standard drinks    Comment: Rarely.  . Drug use: No  . Sexual activity: Yes    Birth control/protection: Surgical  Other Topics Concern  . Not on file  Social History Narrative   ** Merged History Encounter **   Drinks diet Dr. Malachi Bonds about 1 a day.        Social Determinants of Health   Financial Resource Strain:   . Difficulty of Paying Living Expenses: Not on file  Food Insecurity:   . Worried About Charity fundraiser in the Last Year: Not on file  . Ran Out of Food in the Last Year: Not on file  Transportation Needs:   . Lack of Transportation (Medical): Not on file  . Lack of Transportation (Non-Medical): Not on file  Physical Activity:   . Days of Exercise per Week: Not on file  . Minutes of Exercise per Session: Not on file  Stress:   . Feeling of Stress : Not on file  Social Connections:   . Frequency of Communication with Friends and Family: Not on file  . Frequency of Social Gatherings with Friends and Family: Not on file  . Attends Religious Services: Not on file  . Active Member of Clubs or Organizations: Not on file  . Attends Archivist Meetings: Not on file  . Marital Status: Not on file    Her Allergies Are:  Allergies  Allergen Reactions  . Morphine And Related Other (See Comments)    Migraines  . Reglan [Metoclopramide] Other (See Comments)    Psychotic episodes  . Toradol [Ketorolac Tromethamine] Rash  :   Her Current Medications Are:  Outpatient Encounter Medications as of 08/12/2020  Medication Sig  . atorvastatin (LIPITOR) 10 MG tablet Take 1 tablet (10 mg total) by mouth daily.  . blood glucose meter kit and supplies KIT Dispense based on patient and insurance preference. Use up to four times daily as directed. (FOR  ICD-9 250.00, 250.01).  . dapagliflozin propanediol (FARXIGA) 10 MG TABS tablet Take 1 tablet (10 mg total) by mouth daily.  . DULoxetine  (CYMBALTA) 30 MG capsule TAKE 1 CAPSULE (30 MG TOTAL) BY MOUTH DAILY.  . DULoxetine (CYMBALTA) 60 MG capsule Take 1 capsule (60 mg total) by mouth daily.  [name] glucose blood (FREESTYLE LITE) test strip USE AS DIRECTED THREE TO FOUR TIMES DAILY  . hydrochlorothiazide (HYDRODIURIL) 25 MG tablet Take 1 tablet (25 mg total) by mouth daily.  [name] ibuprofen (ADVIL) 200 MG tablet Take 800 mg by mouth daily as needed for headache or moderate pain.  . Insulin Glargine (BASAGLAR KWIKPEN) 100 UNIT/ML Inject 1 mL (100 Units total) into the skin at bedtime.  . Insulin Pen Needle (EASY TOUCH SAFETY PEN NEEDLES) 29G X 8MM MISC Use to inject insulin  . Insulin Syringe-Needle U-100 (TRUEPLUS INSULIN SYRINGE) 30G X 5/16" 0.5 ML MISC USE AS DIRECTED EVERY DAY with insulin pen  . lisinopril (ZESTRIL) 5 MG tablet Take 1 tablet (5 mg total) by mouth daily.  . metFORMIN (GLUCOPHAGE) 1000 MG tablet Take 1 tablet (1,000 mg total) by mouth 2 (two) times daily with a meal.  . omega-3 acid ethyl esters (LOVAZA) 1 g capsule Take 2 capsules (2 g total) by mouth 2 (two) times daily.   No facility-administered encounter medications on file as of 08/12/2020.  :  Review of Systems:  Out of a complete 14 point review of systems, all are reviewed and negative with the exception of these symptoms as listed below: Review of Systems  Neurological:       Pt presents today to discuss getting an updated sleep study. Pt does endorse snoring. Pt is also complaining of always feeling sleepy. Pt uses [facility] as her DME.  Epworth Sleepiness Scale 0= would never doze 1= slight chance of dozing 2= moderate chance of dozing 3= high chance of dozing  Sitting and reading: 2 Watching TV: 2 Sitting inactive in a public place (ex. Theater or meeting): 1 As a passenger in a car for an hour without a break: 2 Lying down to rest in the afternoon: 3 Sitting and talking to someone: 0 Sitting quietly after lunch (no alcohol): 2 In a car, while stopped  in traffic: 0 Total: 12      Objective:  Neurological Exam  Physical Exam Physical Examination:   Vitals:   08/12/20 1044  BP: (!) 152/88  Pulse: 85   General Examination: The patient is a very pleasant 44 y.o. female in no acute distress. She appears well-developed and well-nourished and well groomed.   HEENT: Normocephalic, atraumatic, pupils are equal, round and reactive to light, extraocular tracking is good without limitation to gaze excursion or nystagmus noted. Normal smooth pursuit is noted. Hearing is grossly intact. Face is symmetric with normal facial animation. Speech is clear with no dysarthria noted. There is no hypophonia. There is no lip, neck/head, jaw or voice tremor. Neck is supple with full range of passive and active motion. There are no carotid bruits on auscultation. Oropharynx exam reveals: mild mouth dryness, adequate dental hygiene and significant airway crowding, neck circumference of 20-1/4 inches.  Tongue protrudes centrally in palate elevates symmetrically.    Chest: Clear to auscultation without wheezing, rhonchi or crackles noted.  Heart: S1+S2+0, regular and normal without murmurs, rubs or gallops noted.   Abdomen: Soft, non-tender and non-distended with normal bowel sounds appreciated on auscultation.  Extremities: There is trace pitting edema in the distal lower  extremities bilaterally.   Skin: Warm and dry without trophic changes noted.   Musculoskeletal: exam reveals no obvious joint deformities, tenderness or joint swelling or erythema.   Neurologically:  Mental status: The patient is awake, alert and oriented in all 4 spheres. Her immediate and remote memory, attention, language skills and fund of knowledge are appropriate. There is no evidence of aphasia, agnosia, apraxia or anomia. Speech is clear with normal prosody and enunciation. Thought process is linear. Mood is normal and affect is normal.  Cranial nerves II - XII are as  described above under HEENT exam.  Motor exam: Normal bulk, strength and tone is noted. There is no drift, or tremor. Fine motor skills and coordination: grossly intact in the UEs.  Cerebellar testing: No dysmetria or intention tremor. There is no truncal or gait ataxia.  Sensory exam: intact to light touch in the upper and lower extremities.  Gait, station and balance: She stands easily. No veering to one side is noted. No leaning to one side is noted. Posture is age-appropriate and stance is narrow based. Gait shows normal stride length and normal pace. No problems turning are noted.   Assessment and Plan:   In summary, TERRESA MARLETT is a very pleasant 44 year old female with an underlying medical history of hypertension, morbid obesity, diverticulitis, depression, plantar fasciitis, hyper lipidemia, type 2 diabetes, PCOS, and smoking, who several years ago and is on an older CPAP machine.  She is fully compliant with treatment.  She had an interim split-night sleep study through our lab on 08/03/2015 which confirmed severe obstructive sleep apnea but she did not get a new machine at the time.  She would be eligible for a new CPAP machine.  She is interested in being reevaluated with a proper sleep study.  I did suggest we proceed with a nocturnal polysomnogram.  Regarding her sleepiness.  It is possible that it is in part related to her depression or also her antidepressant medication although she reports that she has been on Cymbalta for many years.  Looking back at my record from 2016, she appears to have been only on 60 mg at the time.  In order to evaluate her sleepiness for a organic underlying sleepiness disorder such as narcolepsy she would unfortunately have to come off of her antidepressant medication and currently it is not possible for her to do that as she had a significant bout of depression when she was off of her medication a few months ago.  She is agreeable to pursuing a sleep study  to qualify for new CPAP equipment.  She is commended for her treatment adherence with CPAP currently and is advised to continue to use her current machine.  She is encouraged to continue to work on weight loss and smoking cessation.  She is advised that we will call her from the sleep lab directly to schedule her sleep study and also with the results once they are available and we will arrange for a new prescription for supplies and a new machine and a follow-up thereafter.  I answered all her questions today and she was in agreement with the plan.   Thank you very much for allowing me to participate in the care of this nice patient. If I can be of any further assistance to you please do not hesitate to call me at [phone].  Sincerely,   Star Age, MD, PhD

## 2020-08-12 NOTE — Patient Instructions (Signed)
  Here is what we discussed today and what we came up with as our plan for you:    Based on your symptoms and your exam I believe you are still at risk for obstructive sleep apnea and would benefit from reevaluation as it has been many years and you need new supplies and updated machine. Therefore, I think we should proceed with a sleep study to determine how severe your sleep apnea is. If you have more than mild OSA, I want you to consider ongoing treatment with CPAP. Please remember, the risks and ramifications of moderate to severe obstructive sleep apnea or OSA are: Cardiovascular disease, including congestive heart failure, stroke, difficult to control hypertension, arrhythmias, and even type 2 diabetes has been linked to untreated OSA. Sleep apnea causes disruption of sleep and sleep deprivation in most cases, which, in turn, can cause recurrent headaches, problems with memory, mood, concentration, focus, and vigilance. Most people with untreated sleep apnea report excessive daytime sleepiness, which can affect their ability to drive. Please do not drive if you feel sleepy.   I will likely see you back after your sleep study to go over the test results and where to go from there. We will call you after your sleep study to advise about the results (most likely, you will hear from [place], my nurse) and to set up an appointment at the time, as necessary.    Our sleep lab administrative assistant will call you to schedule your sleep study. If you don't hear back from her by about 2 weeks from now, please feel free to call her at [phone]. You can leave a message with your phone number and concerns, if you get the voicemail box. She will call back as soon as possible.

## 2020-08-22 MED FILL — BASAGLAR 100 UNIT/ML KWIKPE: 100 | 30 days supply | Qty: 30 | Fill #2

## 2020-09-25 MED FILL — BASAGLAR 100 UNIT/ML KWIKPE: 100 | 30 days supply | Qty: 30 | Fill #3

## 2020-09-25 MED FILL — OMEGA-3 ETHYL ESTERS 1 GM C: 1 | 30 days supply | Qty: 120 | Fill #1

## 2020-09-29 ENCOUNTER — Telehealth: Payer: Self-pay

## 2020-09-29 NOTE — Telephone Encounter (Signed)
Pt has called stating she needs to reschedule HST appt for 09/30/20. States she has to work the next day and can't go without sleeping with her CPAP. I lvm fot pt at 10:10am 12/21 for pt to call me back.

## 2020-10-14 ENCOUNTER — Encounter: Payer: Self-pay | Admitting: Physician Assistant

## 2020-10-14 ENCOUNTER — Other Ambulatory Visit: Payer: Self-pay | Admitting: Physician Assistant

## 2020-10-14 ENCOUNTER — Other Ambulatory Visit: Payer: Self-pay

## 2020-10-14 ENCOUNTER — Ambulatory Visit (INDEPENDENT_AMBULATORY_CARE_PROVIDER_SITE_OTHER): Payer: No Typology Code available for payment source | Admitting: Physician Assistant

## 2020-10-14 ENCOUNTER — Telehealth: Payer: Self-pay

## 2020-10-14 VITALS — BP 142/81 | HR 81 | Ht 65.0 in | Wt 265.3 lb

## 2020-10-14 DIAGNOSIS — F32A Depression, unspecified: Secondary | ICD-10-CM | POA: Diagnosis not present

## 2020-10-14 DIAGNOSIS — Z794 Long term (current) use of insulin: Secondary | ICD-10-CM

## 2020-10-14 DIAGNOSIS — E1165 Type 2 diabetes mellitus with hyperglycemia: Secondary | ICD-10-CM

## 2020-10-14 DIAGNOSIS — F419 Anxiety disorder, unspecified: Secondary | ICD-10-CM

## 2020-10-14 DIAGNOSIS — E1169 Type 2 diabetes mellitus with other specified complication: Secondary | ICD-10-CM

## 2020-10-14 DIAGNOSIS — E1159 Type 2 diabetes mellitus with other circulatory complications: Secondary | ICD-10-CM | POA: Diagnosis not present

## 2020-10-14 DIAGNOSIS — I152 Hypertension secondary to endocrine disorders: Secondary | ICD-10-CM

## 2020-10-14 DIAGNOSIS — E785 Hyperlipidemia, unspecified: Secondary | ICD-10-CM

## 2020-10-14 LAB — POCT GLYCOSYLATED HEMOGLOBIN (HGB A1C): Hemoglobin A1C: 8.2 % — AB (ref 4.0–5.6)

## 2020-10-14 MED ORDER — ATORVASTATIN CALCIUM 10 MG PO TABS: 10.0000 mg | ORAL_TABLET | Freq: Every day | ORAL | 1 refills | Status: DC

## 2020-10-14 MED FILL — ATORVASTATIN CALCIUM 10 MG: 10 | 90 days supply | Qty: 90 | Fill #0

## 2020-10-14 NOTE — Progress Notes (Signed)
Established Patient Office Visit  Subjective:  Patient ID: Renee Ho, female    DOB: [birth year]  Age: 45 y.o. MRN: [id number]  CC:  Chief Complaint  Patient presents with  . Diabetes  . Hypertension  . Depression    HPI Renee Ho presents for follow up on diabetes mellitus, hypertension and mood management.  Diabetes mellitus: Pt denies increased urination or thirst. Pt reports medication compliance. No hypoglycemic events. Not checking glucose at home as often unless does not feel well. Over the Holidays was challenging to maintain low carbohydrate and sweets but has resumed monitoring.   HTN: Pt denies chest pain, palpitations, dizziness or lower extremity swelling. Taking medication as directed without side effects. Checks BP at work sometimes. Pt tries to stay hydrated.  HLD: Pt reports ran out of medication and was advised by pharmacy medication was replaced by Lovaza so has not been taking Lipitor.   Mood: Patient expressed at length over the Holidays experienced a traumatic event with a family remember which has affected her mood. States already has an appointment scheduled with a Social worker. Taking medication as directed.    Past Medical History:  Diagnosis Date  . Depression   . Diabetes mellitus   . Diabetes mellitus without complication ([place])   . Diverticula of intestine   . High cholesterol   . Hypertension   . OSA on CPAP    CPAP pressure= 15  . Polycystic ovarian syndrome   . Sleep apnea    on CPAP with pressure setting= 15    Past Surgical History:  Procedure Laterality Date  . ABDOMINAL HYSTERECTOMY    . CYST REMOVAL NECK    . KNEE ARTHROSCOPY  07/11/2012   Procedure: ARTHROSCOPY KNEE;  Surgeon: Meredith Pel, MD;  Location: [facility];  Service: Orthopedics;  Laterality: Right;  Right knee arthroscopy with debridement  . KNEE SURGERY    . OVARIAN CYST REMOVAL    . WISDOM TOOTH EXTRACTION  05/11/2017    Family History   Problem Relation Age of Onset  . Cancer Mother        breast  . Mental illness Mother   . Depression Mother   . Hyperlipidemia Mother   . Breast cancer Mother   . Healthy Father   . Hyperlipidemia Father   . Cancer Maternal Grandfather        unknown type  . Depression Maternal Grandfather   . Hyperlipidemia Maternal Grandfather   . Stroke Paternal Grandmother   . Hyperlipidemia Paternal Grandmother   . Diabetes Maternal Grandmother   . Hyperlipidemia Maternal Grandmother   . Hyperlipidemia Paternal Grandfather     Social History   Socioeconomic History  . Marital status: Married    Spouse name: Not on file  . Number of children: 1  . Years of education: College  . Highest education level: Not on file  Occupational History  . Not on file  Tobacco Use  . Smoking status: Current Every Day Smoker    Packs/day: 1.00    Years: 20.00    Pack years: 20.00    Types: Cigarettes  . Smokeless tobacco: Never Used  . Tobacco comment: stopped smoking on 02/17/17  Substance and Sexual Activity  . Alcohol use: No    Alcohol/week: 0.0 standard drinks    Comment: Rarely.  . Drug use: No  . Sexual activity: Yes    Birth control/protection: Surgical  Other Topics Concern  . Not on file  Social History Narrative   **  Merged History Encounter **   Drinks diet Dr. Malachi Bonds about 1 a day.        Social Determinants of Health   Financial Resource Strain: Not on file  Food Insecurity: Not on file  Transportation Needs: Not on file  Physical Activity: Not on file  Stress: Not on file  Social Connections: Not on file  Intimate Partner Violence: Not on file    Outpatient Medications Prior to Visit  Medication Sig Dispense Refill  . blood glucose meter kit and supplies KIT Dispense based on patient and insurance preference. Use up to four times daily as directed. (FOR ICD-9 250.00, 250.01). 1 each 0  . dapagliflozin propanediol (FARXIGA) 10 MG TABS tablet Take 1 tablet (10 mg  total) by mouth daily. 90 tablet 1  . DULoxetine (CYMBALTA) 30 MG capsule TAKE 1 CAPSULE (30 MG TOTAL) BY MOUTH DAILY. 90 capsule 0  . DULoxetine (CYMBALTA) 60 MG capsule Take 1 capsule (60 mg total) by mouth daily. 90 capsule 1  . glucose blood (FREESTYLE LITE) test strip USE AS DIRECTED THREE TO FOUR TIMES DAILY 100 each 1  . hydrochlorothiazide (HYDRODIURIL) 25 MG tablet Take 1 tablet (25 mg total) by mouth daily. 90 tablet 1  . ibuprofen (ADVIL) 200 MG tablet Take 800 mg by mouth daily as needed for headache or moderate pain.    . Insulin Glargine (BASAGLAR KWIKPEN) 100 UNIT/ML Inject 1 mL (100 Units total) into the skin at bedtime. 30 mL 3  . Insulin Pen Needle (EASY TOUCH SAFETY PEN NEEDLES) 29G X 8MM MISC Use to inject insulin 100 each 3  . lisinopril (ZESTRIL) 5 MG tablet Take 1 tablet (5 mg total) by mouth daily. 90 tablet 1  . metFORMIN (GLUCOPHAGE) 1000 MG tablet Take 1 tablet (1,000 mg total) by mouth 2 (two) times daily with a meal. 180 tablet 1  . omega-3 acid ethyl esters (LOVAZA) 1 g capsule Take 2 capsules (2 g total) by mouth 2 (two) times daily. 120 capsule 1  . Insulin Syringe-Needle U-100 (TRUEPLUS INSULIN SYRINGE) 30G X 5/16" 0.5 ML MISC USE AS DIRECTED EVERY DAY with insulin pen (Patient not taking: Reported on 10/14/2020) 100 each 3  . atorvastatin (LIPITOR) 10 MG tablet Take 1 tablet (10 mg total) by mouth daily. (Patient not taking: Reported on 10/14/2020) 90 tablet 1   No facility-administered medications prior to visit.    Allergies  Allergen Reactions  . Morphine And Related Other (See Comments)    Migraines  . Reglan [Metoclopramide] Other (See Comments)    Psychotic episodes  . Toradol [Ketorolac Tromethamine] Rash    ROS Review of Systems Review of Systems:  A fourteen system review of systems was performed and found to be positive as per HPI.  Objective:    Physical Exam General:  Well Developed, well nourished, appropriate for stated age.  Neuro:   Alert and oriented,  extra-ocular muscles intact  HEENT:  Normocephalic, atraumatic, neck supple Skin:  no gross rash, warm, pink. Cardiac:  RRR, S1 S2, w/o murmur Respiratory:  ECTA B/L, Not using accessory muscles, speaking in full sentences- unlabored. Vascular:  Ext warm, no cyanosis apprec.; cap RF less 2 sec. Psych:  No HI/SI, judgement and insight good,  Mood- emotional. Full Affect.   BP (!) 142/81   Pulse 81   Ht 5' 5" (1.651 m)   Wt 265 lb 4.8 oz (120.3 kg)   SpO2 97%   BMI 44.15 kg/m  Wt Readings from Last 3  Encounters:  10/14/20 265 lb 4.8 oz (120.3 kg)  08/12/20 264 lb (119.7 kg)  07/08/20 268 lb 12.8 oz (121.9 kg)     Health Maintenance Due  Topic Date Due  . COLONOSCOPY (Pts 45-85yr Insurance coverage will need to be confirmed)  Never done  . OPHTHALMOLOGY EXAM  08/31/2019  . INFLUENZA VACCINE  05/11/2020    There are no preventive care reminders to display for this patient.  Lab Results  Component Value Date   TSH 1.640 05/13/2020   Lab Results  Component Value Date   WBC 7.8 05/13/2020   HGB 17.9 (H) 05/13/2020   HCT 53.2 (H) 05/13/2020   MCV 90 05/13/2020   PLT 167 05/13/2020   Lab Results  Component Value Date   NA 136 05/13/2020   K 4.1 05/13/2020   CO2 21 05/13/2020   GLUCOSE 268 (H) 05/13/2020   BUN 16 05/13/2020   CREATININE 0.79 05/13/2020   BILITOT 0.4 05/13/2020   ALKPHOS 64 05/13/2020   AST 13 05/13/2020   ALT 11 05/13/2020   PROT 7.3 05/13/2020   ALBUMIN 4.6 05/13/2020   CALCIUM 9.9 05/13/2020   ANIONGAP 10 12/22/2016   Lab Results  Component Value Date   CHOL 174 05/13/2020   Lab Results  Component Value Date   HDL 31 (L) 05/13/2020   Lab Results  Component Value Date   LDLCALC 98 05/13/2020   Lab Results  Component Value Date   TRIG 264 (H) 05/13/2020   Lab Results  Component Value Date   CHOLHDL 5.6 (H) 05/13/2020   Lab Results  Component Value Date   HGBA1C 8.2 (A) 10/14/2020      Assessment &  Plan:   Problem List Items Addressed This Visit      Cardiovascular and Mediastinum   Hypertension associated with diabetes ([place]   Relevant Medications   atorvastatin (LIPITOR) 10 MG tablet     Endocrine   Type 2 diabetes mellitus with hyperglycemia, with long-term current use of insulin ([facility]) - Primary   Relevant Medications   atorvastatin (LIPITOR) 10 MG tablet   Other Relevant Orders   POCT glycosylated hemoglobin (Hb A1C) (Completed)   Hyperlipidemia associated with type 2 diabetes mellitus ([facility])   Relevant Medications   atorvastatin (LIPITOR) 10 MG tablet     Other   Depression    Other Visit Diagnoses    Anxiety         Type 2 diabetes mellitus with hyperglycemia, with long-term current use of insulin: -A1c today 8.2, continues to be above goal but has improved from 8.9 (initially 10.1). -Discussed with patient to continue with dietary changes by reducing sweets and carbohydrates which will help improve A1c. Will continue with current medication regimen. If A1c worsens or fails to improve will consider medication adjustments such as adding glipizide.  -Recommend ambulatory glucose monitoring and to stay as active as possible. -Will continue to monitor.  Hypertension associated with diabetes: -Above goal but has improved. -Continue current medication regimen. Will consider increasing Lisinopril to 10 mg if BP >140/90 next OV.  -Recommend ambulatory BP/pulse monitoring.  -Recommend to follow low sodium diet and stay well hydrated. -Will continue to monitor. Last CMP, renal function and electrolytes wnl.  Hyperlipidemia associated with type 2 diabetes mellitus: -Last lipid panel: Total cholesterol 174, triglycerides 264, HDL 31, LDL 98 (overall improved) -Discussed with patient to continue Lovaza and recommend resuming Lipitor.  Provided refills. -Follow a heart healthy diet low in saturated and transfats.  Continue to stay as active as possible. -Will continue to  monitor and plan to repeat lipid panel and hepatic function next OV.  Depression, Anxiety: -PHQ-9 of 14, denies SI/HI. -Discussed with patient medication adjustments and prefers to stay on current dose of Cymbalta.  -Recommend to pursue behavioral health therapy sessions as scheduled. -Will continue to monitor.    Meds ordered this encounter  Medications  . atorvastatin (LIPITOR) 10 MG tablet    Sig: Take 1 tablet (10 mg total) by mouth daily.    Dispense:  90 tablet    Refill:  1    Order Specific Question:   Supervising Provider    Answer:   Beatrice Lecher D [[id number]]    Follow-up: Return in about 3 months (around 01/12/2021) for DM, HTN, HLD and FBW (repeat lipid panel, a1c, cmp).   Note:  This note was prepared with assistance of Dragon voice recognition software. Occasional wrong-word or sound-a-like substitutions may have occurred due to the inherent limitations of voice recognition software.   [name], PA-C

## 2020-10-14 NOTE — Patient Instructions (Signed)

## 2020-10-14 NOTE — Telephone Encounter (Signed)
LVM for pt to call me back to schedule sleep study  

## 2020-11-10 ENCOUNTER — Other Ambulatory Visit: Payer: Self-pay | Admitting: Physician Assistant

## 2020-11-10 DIAGNOSIS — E1165 Type 2 diabetes mellitus with hyperglycemia: Secondary | ICD-10-CM

## 2020-11-10 DIAGNOSIS — Z794 Long term (current) use of insulin: Secondary | ICD-10-CM

## 2020-11-10 MED FILL — BASAGLAR 100 UNIT/ML KWIKPE: 100 | 30 days supply | Qty: 30 | Fill #0

## 2020-11-10 MED FILL — FARXIGA 10 MG TABLET: 10 | 90 days supply | Qty: 90 | Fill #0

## 2020-11-12 ENCOUNTER — Telehealth: Payer: Self-pay | Admitting: Physician Assistant

## 2020-11-12 ENCOUNTER — Other Ambulatory Visit: Payer: Self-pay | Admitting: Physician Assistant

## 2020-11-12 DIAGNOSIS — F410 Panic disorder [episodic paroxysmal anxiety] without agoraphobia: Secondary | ICD-10-CM

## 2020-11-12 DIAGNOSIS — F515 Nightmare disorder: Secondary | ICD-10-CM

## 2020-11-12 DIAGNOSIS — Z794 Long term (current) use of insulin: Secondary | ICD-10-CM

## 2020-11-12 DIAGNOSIS — E1165 Type 2 diabetes mellitus with hyperglycemia: Secondary | ICD-10-CM

## 2020-11-12 MED ORDER — HYDROXYZINE PAMOATE 25 MG PO CAPS: 25.0000 mg | ORAL_CAPSULE | Freq: Three times a day (TID) | ORAL | 0 refills | Status: DC | PRN

## 2020-11-12 MED FILL — HYDROXYZINE PAM 25 MG CAP: 25 | 10 days supply | Qty: 30 | Fill #0

## 2020-11-12 NOTE — Telephone Encounter (Signed)
Patient called in requesting a referral to a psychiatrist in [place]. Patient can be contacted at [phone], thanks.

## 2020-11-12 NOTE — Addendum Note (Signed)
Addended by: Sylvester Harder on: 11/12/2020 12:07 PM   Modules accepted: Orders

## 2020-11-12 NOTE — Telephone Encounter (Signed)
Contacted pt to see why referral was needed. Patient states she had been having panic attacks and nightmares since a traumatic experience happened and that she is at work in a room alone crying trying to make it through the day. I spoke with Kandis Cocking who was agreeable to sending in Hydroxyzine for patient and also suggested to increase Cymbalta to 12omg qd from 90mg . Patient agreeable to Hydroxyzine but declines increase in Cymbalta dose. Patient scheduled apt for Mood on 11/17/20 and referral to psych has been placed. Patient requesting FMLA paper work to take a leave of absence. Patient advised we could discuss at upcoming apt. AS, CMA

## 2020-11-12 NOTE — Telephone Encounter (Signed)
Pharmacy called and stated that semglee is now preferred over basaglar and is insurance preferred now. Semglee is the new preffered insulin glargine now. Thanks

## 2020-11-12 NOTE — Telephone Encounter (Signed)
Please see below and advise. AS, CMA 

## 2020-11-13 ENCOUNTER — Other Ambulatory Visit: Payer: Self-pay | Admitting: Physician Assistant

## 2020-11-13 MED ORDER — SEMGLEE 100 UNIT/ML ~~LOC~~ SOPN: 100.0000 [IU] | PEN_INJECTOR | Freq: Every day | SUBCUTANEOUS | 5 refills | Status: DC

## 2020-11-13 NOTE — Addendum Note (Signed)
Addended by: Mayer Masker on: 11/13/2020 05:17 PM   Modules accepted: Orders

## 2020-11-13 NOTE — Telephone Encounter (Signed)
Sent rx for insulin glargine (Semglee).

## 2020-11-17 ENCOUNTER — Ambulatory Visit (INDEPENDENT_AMBULATORY_CARE_PROVIDER_SITE_OTHER): Payer: No Typology Code available for payment source | Admitting: Neurology

## 2020-11-17 ENCOUNTER — Ambulatory Visit: Payer: No Typology Code available for payment source | Admitting: Physician Assistant

## 2020-11-17 DIAGNOSIS — G4719 Other hypersomnia: Secondary | ICD-10-CM

## 2020-11-17 DIAGNOSIS — G4733 Obstructive sleep apnea (adult) (pediatric): Secondary | ICD-10-CM | POA: Diagnosis not present

## 2020-11-17 DIAGNOSIS — Z9989 Dependence on other enabling machines and devices: Secondary | ICD-10-CM

## 2020-11-17 DIAGNOSIS — R519 Headache, unspecified: Secondary | ICD-10-CM

## 2020-11-20 NOTE — Progress Notes (Signed)
Patient was referred by her primary care PA for reevaluation of her obstructive sleep apnea.  I saw her on 08/12/2020 and she has been on CPAP of 15 cm for years.  She is compliant with treatment but should be eligible for a new machine.  She had a home sleep test on 11/17/2020 which confirmed severe obstructive sleep apnea.  I will write for an AutoPap machine.  Please notify patient that we will send the order to her DME company.  She will need a follow-up appointment within 3 months after starting treatment with new equipment.  Please advise her to continue to be fully compliant with treatment.  If there is a delay in getting a new machine, she is advised to continue using her current machine consistently in the meantime.

## 2020-11-20 NOTE — Procedures (Signed)
   [facility]  HOME SLEEP TEST (Watch PAT)  STUDY DATE: 11/17/20  DOB: July 28, 1976  MRN: [id number]  ORDERING CLINICIAN: Huston Foley, MD, PhD   REFERRING CLINICIAN: Mayer Masker, PA-C   CLINICAL INFORMATION/HISTORY: 45 year old woman with a history of PCOS, hypertension, hyperlipidemia, diabetes, depression, and severe obesity with a BMI of over 40, who was previously diagnosed with obstructive sleep apnea. She has been on CPAP and presents for re-evaluation of her diagnosis. She is eligible for a new machine.   Epworth sleepiness score: 12/24.  BMI: 44.4 kg/m  Neck Circumference: 20 1/4"  FINDINGS:   Total Record Time (hours, min): 10 H 2 min  Total Sleep Time (hours, min):  8 H 20 min   Percent REM (%):    7.99 %   Calculated pAHI (per hour): 54.1       REM pAHI: 76.6    NREM pAHI: 52.2 Supine AHI: N/A   Oxygen Saturation (%) Mean: 93  Minimum oxygen saturation (%):         84   O2 Saturation Range (%): 84-98  O2Saturation (minutes) <=88%: 1.3 min  Pulse Mean (bpm):    66  Pulse Range (66-103)   IMPRESSION: OSA (obstructive sleep apnea), severe   RECOMMENDATION:  This home sleep test demonstrates severe obstructive sleep apnea with a total AHI of 54.1/hour and O2 nadir of 84%. Ongoing treatment with positive airway pressure is recommended. The patient will be advised to start treatment with a new autoPAP machine at home for now.  A full night titration study can be considered to achieve optimization of treatment settings down the road.  Alternative treatment options may be limited secondary to the severity of her sleep apnea.  Weight loss is recommended.  Please note that untreated obstructive sleep apnea may carry additional perioperative morbidity. Patients with significant obstructive sleep apnea should receive perioperative PAP therapy and the surgeons and particularly the anesthesiologist should be informed of the diagnosis and the severity of the sleep  disordered breathing. The patient should be cautioned not to drive, work at heights, or operate dangerous or heavy equipment when tired or sleepy. Review and reiteration of good sleep hygiene measures should be pursued with any patient. Other causes of the patient's symptoms, including circadian rhythm disturbances, an underlying mood disorder, medication effect and/or an underlying medical problem cannot be ruled out based on this test. Clinical correlation is recommended. The patient and her referring provider will be notified of the test results. The patient will be seen in follow up in sleep clinic at [facility].  I certify that I have reviewed the raw data recording prior to the issuance of this report in accordance with the standards of the American Academy of Sleep Medicine (AASM).  INTERPRETING PHYSICIAN:  Huston Foley, MD, PhD  Board Certified in Neurology and Sleep Medicine [facility] [address], [address] [place], [place] [address] [phone]

## 2020-11-20 NOTE — Addendum Note (Signed)
Addended by: Huston Foley on: 11/20/2020 05:51 PM   Modules accepted: Orders

## 2020-11-26 ENCOUNTER — Other Ambulatory Visit: Payer: Self-pay | Admitting: Physician Assistant

## 2020-11-26 DIAGNOSIS — F32A Depression, unspecified: Secondary | ICD-10-CM

## 2020-11-26 DIAGNOSIS — I152 Hypertension secondary to endocrine disorders: Secondary | ICD-10-CM

## 2020-11-26 DIAGNOSIS — E1165 Type 2 diabetes mellitus with hyperglycemia: Secondary | ICD-10-CM

## 2020-11-26 DIAGNOSIS — E1159 Type 2 diabetes mellitus with other circulatory complications: Secondary | ICD-10-CM

## 2020-11-26 MED FILL — OMEGA-3 ETHYL ESTERS 1 GM C: 1 | 30 days supply | Qty: 120 | Fill #0

## 2020-11-26 MED FILL — HYDROCHLOROTHIAZIDE 25 MG T: 25 | 90 days supply | Qty: 90 | Fill #0

## 2020-11-26 MED FILL — METFORMIN HCL 1000 MG TABS: 1000 | 90 days supply | Qty: 180 | Fill #0

## 2020-11-26 MED FILL — LISINOPRIL 5 MG TABS: 5 | 90 days supply | Qty: 90 | Fill #0

## 2020-11-26 MED FILL — DULoxetine HCL 30 MG CPEP: 30 | 90 days supply | Qty: 90 | Fill #0

## 2020-11-26 MED FILL — DULoxetine HCL 60 MG CPEP: 60 | 90 days supply | Qty: 90 | Fill #0

## 2020-11-27 ENCOUNTER — Telehealth: Payer: Self-pay

## 2020-11-27 NOTE — Telephone Encounter (Signed)
I called pt. I advised pt that Dr. Frances Furbish reviewed their sleep study results and found that pt has severe osa. Dr. Frances Furbish recommends that pt start an auto pap for treatment. I reviewed PAP compliance expectations with the pt. Pt is agreeable to starting an auto-PAP. I advised pt that an order will be sent to a DME, [facility], and [facility] will call the pt within about one week after they file with the pt's insurance. [facility] will show the pt how to use the machine, fit for masks, and troubleshoot the auto-PAP if needed. Pt will be contacted in a month to determine start date due to national pap shortage.

## 2020-12-24 ENCOUNTER — Other Ambulatory Visit (HOSPITAL_COMMUNITY): Payer: Self-pay | Admitting: Physician Assistant

## 2020-12-24 ENCOUNTER — Other Ambulatory Visit: Payer: Self-pay | Admitting: Physician Assistant

## 2020-12-24 DIAGNOSIS — E1165 Type 2 diabetes mellitus with hyperglycemia: Secondary | ICD-10-CM

## 2020-12-24 DIAGNOSIS — Z794 Long term (current) use of insulin: Secondary | ICD-10-CM

## 2021-01-10 ENCOUNTER — Other Ambulatory Visit (HOSPITAL_COMMUNITY): Payer: Self-pay

## 2021-01-12 ENCOUNTER — Other Ambulatory Visit (HOSPITAL_COMMUNITY): Payer: Self-pay

## 2021-01-12 ENCOUNTER — Ambulatory Visit (INDEPENDENT_AMBULATORY_CARE_PROVIDER_SITE_OTHER): Payer: No Typology Code available for payment source | Admitting: Physician Assistant

## 2021-01-12 ENCOUNTER — Encounter: Payer: Self-pay | Admitting: Physician Assistant

## 2021-01-12 ENCOUNTER — Other Ambulatory Visit: Payer: Self-pay

## 2021-01-12 VITALS — BP 138/76 | HR 83 | Temp 98.6°F | Ht 65.0 in | Wt 277.1 lb

## 2021-01-12 DIAGNOSIS — E1165 Type 2 diabetes mellitus with hyperglycemia: Secondary | ICD-10-CM

## 2021-01-12 DIAGNOSIS — G4700 Insomnia, unspecified: Secondary | ICD-10-CM

## 2021-01-12 DIAGNOSIS — E1169 Type 2 diabetes mellitus with other specified complication: Secondary | ICD-10-CM

## 2021-01-12 DIAGNOSIS — Z794 Long term (current) use of insulin: Secondary | ICD-10-CM | POA: Diagnosis not present

## 2021-01-12 DIAGNOSIS — F419 Anxiety disorder, unspecified: Secondary | ICD-10-CM

## 2021-01-12 DIAGNOSIS — E785 Hyperlipidemia, unspecified: Secondary | ICD-10-CM

## 2021-01-12 DIAGNOSIS — F32A Depression, unspecified: Secondary | ICD-10-CM

## 2021-01-12 DIAGNOSIS — R413 Other amnesia: Secondary | ICD-10-CM

## 2021-01-12 DIAGNOSIS — I152 Hypertension secondary to endocrine disorders: Secondary | ICD-10-CM

## 2021-01-12 DIAGNOSIS — E1159 Type 2 diabetes mellitus with other circulatory complications: Secondary | ICD-10-CM | POA: Diagnosis not present

## 2021-01-12 DIAGNOSIS — R5383 Other fatigue: Secondary | ICD-10-CM

## 2021-01-12 LAB — POCT GLYCOSYLATED HEMOGLOBIN (HGB A1C): Hemoglobin A1C: 8.9 % — AB (ref 4.0–5.6)

## 2021-01-12 MED ORDER — OZEMPIC (0.25 OR 0.5 MG/DOSE) 2 MG/1.5ML ~~LOC~~ SOPN
PEN_INJECTOR | SUBCUTANEOUS | 1 refills | Status: DC
  Filled 2021-01-12: qty 1.5, 28d supply, fill #0
  Filled 2021-02-24: qty 1.5, 28d supply, fill #1
  Filled 2021-03-30: qty 1.5, 28d supply, fill #2

## 2021-01-12 NOTE — Assessment & Plan Note (Signed)
-  Last lipid panel: Total cholesterol 174, triglycerides 264, HDL 31, LDL 98 -Continue current medication regimen.  Will repeat lipid panel and hepatic function today.  Pending results patient will likely benefit from increasing Lipitor to achieve LDL goal <70. -Recommend to follow a heart healthy diet.

## 2021-01-12 NOTE — Progress Notes (Signed)
Established Patient Office Visit  Subjective:  Patient ID: Renee Ho, female    DOB: [birth year]  Age: 45 y.o. MRN: [id number]  CC:  Chief Complaint  Patient presents with  . Follow-up    MOOD  . Hyperlipidemia  . Hypertension  . Diabetes    HPI Shane Melby Nickles-Wright presents for follow up on diabetes mellitus, hypertension, and mood management.  Patient has memory concerns.  Reports recently has noticed issues with short-term memory.  Continues to feel tired, has trouble sleeping, and progressive weight gain.  Takes Unisom to help with sleep. Denies major diet changes and is trying to watch her diet.  Completed new sleep study and is waiting for new CPAP machine.  Diabetes mellitus: Pt reports persistent dry mouth and feeling thirsty. Pt reports medication compliance. No hypoglycemic events. Checking glucose at home on and off.  FBS range in the 200s.  HTN: Pt denies chest pain, palpitations, dizziness or lower extremity swelling. Taking medication as directed without side effects.  Sometimes checks BP at work. Pt follows a low salt diet and tries to stay hydrated.  HLD: Pt taking medication as directed without issues. Reports has been on Lipitor for a while.  Mood: States her mental health is better.  Decided not to pursue psychology or psychiatry referral.  States only took about 5 tablets of hydroxyzine to help with severe anxiety/panic attacks and has not needed to take any recently. Patient decided to remove her brother-in-law from her personal life and has been able to move on. States Cymbalta 90 mg continues to work fine.  Past Medical History:  Diagnosis Date  . Depression   . Diabetes mellitus   . Diabetes mellitus without complication ([place])   . Diverticula of intestine   . High cholesterol   . Hypertension   . OSA on CPAP    CPAP pressure= 15  . Polycystic ovarian syndrome   . Sleep apnea    on CPAP with pressure setting= 15    Past Surgical  History:  Procedure Laterality Date  . ABDOMINAL HYSTERECTOMY    . CYST REMOVAL NECK    . KNEE ARTHROSCOPY  07/11/2012   Procedure: ARTHROSCOPY KNEE;  Surgeon: Meredith Pel, MD;  Location: [facility];  Service: Orthopedics;  Laterality: Right;  Right knee arthroscopy with debridement  . KNEE SURGERY    . OVARIAN CYST REMOVAL    . WISDOM TOOTH EXTRACTION  05/11/2017    Family History  Problem Relation Age of Onset  . Cancer Mother        breast  . Mental illness Mother   . Depression Mother   . Hyperlipidemia Mother   . Breast cancer Mother   . Healthy Father   . Hyperlipidemia Father   . Cancer Maternal Grandfather        unknown type  . Depression Maternal Grandfather   . Hyperlipidemia Maternal Grandfather   . Stroke Paternal Grandmother   . Hyperlipidemia Paternal Grandmother   . Diabetes Maternal Grandmother   . Hyperlipidemia Maternal Grandmother   . Hyperlipidemia Paternal Grandfather     Social History   Socioeconomic History  . Marital status: Married    Spouse name: Not on file  . Number of children: 1  . Years of education: College  . Highest education level: Not on file  Occupational History  . Not on file  Tobacco Use  . Smoking status: Current Every Day Smoker    Packs/day: 1.00    Years:  20.00    Pack years: 20.00    Types: Cigarettes  . Smokeless tobacco: Never Used  . Tobacco comment: stopped smoking on 02/17/17  Substance and Sexual Activity  . Alcohol use: No    Alcohol/week: 0.0 standard drinks    Comment: Rarely.  . Drug use: No  . Sexual activity: Yes    Birth control/protection: Surgical  Other Topics Concern  . Not on file  Social History Narrative   ** Merged History Encounter **   Drinks diet Dr. Malachi Bonds about 1 a day.        Social Determinants of Health   Financial Resource Strain: Not on file  Food Insecurity: Not on file  Transportation Needs: Not on file  Physical Activity: Not on file  Stress: Not on file  Social  Connections: Not on file  Intimate Partner Violence: Not on file    Outpatient Medications Prior to Visit  Medication Sig Dispense Refill  . atorvastatin (LIPITOR) 10 MG tablet TAKE 1 TABLET (10 MG TOTAL) BY MOUTH DAILY. 90 tablet 1  . blood glucose meter kit and supplies KIT Dispense based on patient and insurance preference. Use up to four times daily as directed. (FOR ICD-9 250.00, 250.01). 1 each 0  . dapagliflozin propanediol (FARXIGA) 10 MG TABS tablet TAKE 1 TABLET (10 MG TOTAL) BY MOUTH DAILY. 90 tablet 1  . DULoxetine (CYMBALTA) 30 MG capsule TAKE 1 CAPSULE (30 MG TOTAL) BY MOUTH DAILY. 90 capsule 0  . DULoxetine (CYMBALTA) 60 MG capsule TAKE 1 CAPSULE (60 MG TOTAL) BY MOUTH DAILY. 90 capsule 1  . glucose blood (FREESTYLE LITE) test strip USE AS DIRECTED THREE TO FOUR TIMES DAILY 100 each 1  . hydrochlorothiazide (HYDRODIURIL) 25 MG tablet TAKE 1 TABLET (25 MG TOTAL) BY MOUTH DAILY. 90 tablet 1  . ibuprofen (ADVIL) 200 MG tablet Take 800 mg by mouth daily as needed for headache or moderate pain.    . Insulin Pen Needle (EASY TOUCH SAFETY PEN NEEDLES) 29G X 8MM MISC Use to inject insulin 100 each 3  . lisinopril (ZESTRIL) 5 MG tablet TAKE 1 TABLET (5 MG TOTAL) BY MOUTH DAILY. 90 tablet 1  . metFORMIN (GLUCOPHAGE) 1000 MG tablet TAKE 1 TABLET BY MOUTH TWICE DAILY WITH MEALS. 180 tablet 1  . omega-3 acid ethyl esters (LOVAZA) 1 g capsule TAKE 2 CAPSULES BY MOUTH TWICE A DAY. 120 capsule 1  . insulin glargine (LANTUS) 100 UNIT/ML Solostar Pen INJECT 100 UNITS INTO THE SKIN AT BEDTIME. 15 mL 5  . hydrOXYzine (VISTARIL) 25 MG capsule TAKE 1 CAPSULE (25 MG TOTAL) BY MOUTH 3 (THREE) TIMES DAILY AS NEEDED. (Patient not taking: Reported on 01/12/2021) 30 capsule 0  . Insulin Glargine-yfgn 100 UNIT/ML SOPN INJECT 100 UNITS INTO THE SKIN AT BEDTIME (Patient not taking: Reported on 01/12/2021) 15 mL 5  . Insulin Syringe-Needle U-100 (TRUEPLUS INSULIN SYRINGE) 30G X 5/16" 0.5 ML MISC USE AS DIRECTED  EVERY DAY with insulin pen (Patient not taking: No sig reported) 100 each 3   No facility-administered medications prior to visit.    Allergies  Allergen Reactions  . Morphine And Related Other (See Comments)    Migraines  . Reglan [Metoclopramide] Other (See Comments)    Psychotic episodes  . Toradol [Ketorolac Tromethamine] Rash    ROS Review of Systems A fourteen system review of systems was performed and found to be positive as per HPI.   Objective:    Physical Exam General:  Pleasant and cooperative, in  no acute distress  Neuro:  Alert and oriented,  extra-ocular muscles intact  HEENT:  Normocephalic, atraumatic, neck supple Skin:  no gross rash, warm, pink. Cardiac:  RRR, S1 S2 Respiratory:  ECTA B/L, Not using accessory muscles, speaking in full sentences- unlabored. Vascular:  Ext warm, no cyanosis apprec.; cap RF less 2 sec. Psych:  No HI/SI, judgement and insight good, Euthymic mood. Full Affect.   BP 138/76   Pulse 83   Temp 98.6 F (37 C)   Ht _0  (1.651 m)   Wt 277 lb 1.6 oz (125.7 kg)   SpO2 97%   BMI 46.11 kg/m  Wt Readings from Last 3 Encounters:  01/12/21 277 lb 1.6 oz (125.7 kg)  10/14/20 265 lb 4.8 oz (120.3 kg)  08/12/20 264 lb (119.7 kg)     Health Maintenance Due  Topic Date Due  . COLONOSCOPY (Pts 45-42yr Insurance coverage will need to be confirmed)  Never done  . OPHTHALMOLOGY EXAM  08/31/2019  . COVID-19 Vaccine (3 - Booster for Pfizer series) 01/01/2021    There are no preventive care reminders to display for this patient.  Lab Results  Component Value Date   TSH 1.640 05/13/2020   Lab Results  Component Value Date   WBC 7.8 05/13/2020   HGB 17.9 (H) 05/13/2020   HCT 53.2 (H) 05/13/2020   MCV 90 05/13/2020   PLT 167 05/13/2020   Lab Results  Component Value Date   NA 136 05/13/2020   K 4.1 05/13/2020   CO2 21 05/13/2020   GLUCOSE 268 (H) 05/13/2020   BUN 16 05/13/2020   CREATININE 0.79 05/13/2020   BILITOT 0.4  05/13/2020   ALKPHOS 64 05/13/2020   AST 13 05/13/2020   ALT 11 05/13/2020   PROT 7.3 05/13/2020   ALBUMIN 4.6 05/13/2020   CALCIUM 9.9 05/13/2020   ANIONGAP 10 12/22/2016   Lab Results  Component Value Date   CHOL 174 05/13/2020   Lab Results  Component Value Date   HDL 31 (L) 05/13/2020   Lab Results  Component Value Date   LDLCALC 98 05/13/2020   Lab Results  Component Value Date   TRIG 264 (H) 05/13/2020   Lab Results  Component Value Date   CHOLHDL 5.6 (H) 05/13/2020   Lab Results  Component Value Date   HGBA1C 8.9 (A) 01/12/2021      Assessment & Plan:   Problem List Items Addressed This Visit      Cardiovascular and Mediastinum   Hy[place] associated with diabetes ([place]    -Fairly controlled. -Continue current medication regimen. Will repeat CMP for medication monitoring. -Follow low sodium diet and stay well hydrated. -Will continue to monitor.      Relevant Medications   Semaglutide,0.25 or 0.5MG/DOS, (OZEMPIC, 0.25 OR 0.5 MG/DOSE,) 2 MG/1.5ML SOPN   Other Relevant Orders   POCT HgB A1C (Completed)   Comp Met (CMET)     Endocrine   Type 2 diabetes mellitus with hyperglycemia, with long-term current use of insulin ([facility]) - Primary    -A1c today 8.9, has increased from 8.2.  Discussed with patient treatment adjustments and is agreeable to start GLP-1.  Discussed potential side effects of Ozempic, no personal or family history of MTC or MEN.  Advised to let me know if unable to tolerate medication. -Continue insulin glargine (semglee), Farxiga and Metformin. -Monitor carbohydrates and glucose. -Continue ambulatory glucose monitoring. -Will continue to monitor and reassess in 3 months.      Relevant Medications  Semaglutide,0.25 or 0.5MG/DOS, (OZEMPIC, 0.25 OR 0.5 MG/DOSE,) 2 MG/1.5ML SOPN   Other Relevant Orders   POCT HgB A1C (Completed)   Comp Met (CMET)   Magnesium   B12   Hyperlipidemia associated with type 2 diabetes mellitus ([place])     -Last lipid panel: Total cholesterol 174, triglycerides 264, HDL 31, LDL 98 -Continue current medication regimen.  Will repeat lipid panel and hepatic function today.  Pending results patient will likely benefit from increasing Lipitor to achieve LDL goal <70. -Recommend to follow a heart healthy diet.      Relevant Medications   Semaglutide,0.25 or 0.5MG/DOS, (OZEMPIC, 0.25 OR 0.5 MG/DOSE,) 2 MG/1.5ML SOPN   Other Relevant Orders   POCT HgB A1C (Completed)   Lipid Profile     Other   Depression   Morbid obesity ([facility])   Relevant Medications   Semaglutide,0.25 or 0.5MG/DOS, (OZEMPIC, 0.25 OR 0.5 MG/DOSE,) 2 MG/1.5ML SOPN    Other Visit Diagnoses    Anxiety       Fatigue, unspecified type       Relevant Orders   Comp Met (CMET)   CBC w/Diff   TSH   Vitamin D (25 hydroxy)   Magnesium   B12   Memory changes       Relevant Orders   Magnesium   B12   Insomnia, unspecified type         Depression, Anxiety: -PHQ-2 score of 0 (improved from last visit, score of 4) and PHQ-9 score of 11. Denies SI/HI. -Discussed with patient to continue with mindfulness therapy.  -Continue current medication regimen.  -Will continue to monitor.  Morbid obesity: -Associated with diabetes mellitus, hypertension, sleep apnea, and hyperlipidemia -Patient has gained 12 pounds since last visit. -Started patient on Ozempic (GLP-1) which will also provide weight loss benefits.  Fatigue, unspecified type and memory changes: -Will place lab orders to evaluate for potential etiologies (hematologic, metabolic, endocrine, vitamin deficiency). -Discussed with patient lack of sleep ( CPAP machines are on back order) and stress are likely contributing to symptoms.  -Recommend to monitor symptoms.   Insomnia: -Recommend to continue Unisom as needed for sleep and limit/reduce caffeine use. -Will continue to monitor.   Meds ordered this encounter  Medications  . Semaglutide,0.25 or 0.5MG/DOS, (OZEMPIC,  0.25 OR 0.5 MG/DOSE,) 2 MG/1.5ML SOPN    Sig: Inject 0.25 mg under the skin once a week for 4 weeks. Then increase to inject 0.5 mg once a week.    Dispense:  3 mL    Refill:  1    Order Specific Question:   Supervising Provider    Answer:   Beatrice Lecher D [[id number]]    Follow-up: Return in about 3 months (around 04/13/2021) for DM- added, HTN, Wt.    [name], PA-C

## 2021-01-12 NOTE — Assessment & Plan Note (Signed)
-  A1c today 8.9, has increased from 8.2.  Discussed with patient treatment adjustments and is agreeable to start GLP-1.  Discussed potential side effects of Ozempic, no personal or family history of MTC or MEN.  Advised to let me know if unable to tolerate medication. -Continue insulin glargine (semglee), Farxiga and Metformin. -Monitor carbohydrates and glucose. -Continue ambulatory glucose monitoring. -Will continue to monitor and reassess in 3 months.

## 2021-01-12 NOTE — Assessment & Plan Note (Signed)
-  Fairly controlled. -Continue current medication regimen. Will repeat CMP for medication monitoring. -Follow low sodium diet and stay well hydrated. -Will continue to monitor.

## 2021-01-12 NOTE — Patient Instructions (Signed)
Diabetes Mellitus and Nutrition, Adult When you have diabetes, or diabetes mellitus, it is very important to have healthy eating habits because your blood sugar (glucose) levels are greatly affected by what you eat and drink. Eating healthy foods in the right amounts, at about the same times every day, can help you:  Control your blood glucose.  Lower your risk of heart disease.  Improve your blood pressure.  Reach or maintain a healthy weight. What can affect my meal plan? Every person with diabetes is different, and each person has different needs for a meal plan. Your health care provider may recommend that you work with a dietitian to make a meal plan that is best for you. Your meal plan may vary depending on factors such as:  The calories you need.  The medicines you take.  Your weight.  Your blood glucose, blood pressure, and cholesterol levels.  Your activity level.  Other health conditions you have, such as heart or kidney disease. How do carbohydrates affect me? Carbohydrates, also called carbs, affect your blood glucose level more than any other type of food. Eating carbs naturally raises the amount of glucose in your blood. Carb counting is a method for keeping track of how many carbs you eat. Counting carbs is important to keep your blood glucose at a healthy level, especially if you use insulin or take certain oral diabetes medicines. It is important to know how many carbs you can safely have in each meal. This is different for every person. Your dietitian can help you calculate how many carbs you should have at each meal and for each snack. How does alcohol affect me? Alcohol can cause a sudden decrease in blood glucose (hypoglycemia), especially if you use insulin or take certain oral diabetes medicines. Hypoglycemia can be a life-threatening condition. Symptoms of hypoglycemia, such as sleepiness, dizziness, and confusion, are similar to symptoms of having too much  alcohol.  Do not drink alcohol if: ? Your health care provider tells you not to drink. ? You are pregnant, may be pregnant, or are planning to become pregnant.  If you drink alcohol: ? Do not drink on an empty stomach. ? Limit how much you use to:  0-1 drink a day for women.  0-2 drinks a day for men. ? Be aware of how much alcohol is in your drink. In the [place] one drink equals one 12 oz bottle of beer (355 mL), one 5 oz glass of wine (148 mL), or one 1 oz glass of hard liquor (44 mL). ? Keep yourself hydrated with water, diet soda, or unsweetened iced tea.  Keep in mind that regular soda, juice, and other mixers may contain a lot of sugar and must be counted as carbs. What are tips for following this plan? Reading food labels  Start by checking the serving size on the "Nutrition Facts" label of packaged foods and drinks. The amount of calories, carbs, fats, and other nutrients listed on the label is based on one serving of the item. Many items contain more than one serving per package.  Check the total grams (g) of carbs in one serving. You can calculate the number of servings of carbs in one serving by dividing the total carbs by 15. For example, if a food has 30 g of total carbs per serving, it would be equal to 2 servings of carbs.  Check the number of grams (g) of saturated fats and trans fats in one serving. Choose foods that have   a low amount or none of these fats.  Check the number of milligrams (mg) of salt (sodium) in one serving. Most people should limit total sodium intake to less than 2,300 mg per day.  Always check the nutrition information of foods labeled as "low-fat" or "nonfat." These foods may be higher in added sugar or refined carbs and should be avoided.  Talk to your dietitian to identify your daily goals for nutrients listed on the label. Shopping  Avoid buying canned, pre-made, or processed foods. These foods tend to be high in fat, sodium, and added  sugar.  Shop around the outside edge of the grocery store. This is where you will most often find fresh fruits and vegetables, bulk grains, fresh meats, and fresh dairy. Cooking  Use low-heat cooking methods, such as baking, instead of high-heat cooking methods like deep frying.  Cook using healthy oils, such as olive, canola, or sunflower oil.  Avoid cooking with butter, cream, or high-fat meats. Meal planning  Eat meals and snacks regularly, preferably at the same times every day. Avoid going long periods of time without eating.  Eat foods that are high in fiber, such as fresh fruits, vegetables, beans, and whole grains. Talk with your dietitian about how many servings of carbs you can eat at each meal.  Eat 4-6 oz (112-168 g) of lean protein each day, such as lean meat, chicken, fish, eggs, or tofu. One ounce (oz) of lean protein is equal to: ? 1 oz (28 g) of meat, chicken, or fish. ? 1 egg. ?  cup (62 g) of tofu.  Eat some foods each day that contain healthy fats, such as avocado, nuts, seeds, and fish.   What foods should I eat? Fruits Berries. Apples. Oranges. Peaches. Apricots. Plums. Grapes. Mango. Papaya. Pomegranate. Kiwi. Cherries. Vegetables Lettuce. Spinach. Leafy greens, including kale, chard, collard greens, and mustard greens. Beets. Cauliflower. Cabbage. Broccoli. Carrots. Green beans. Tomatoes. Peppers. Onions. Cucumbers. Brussels sprouts. Grains Whole grains, such as whole-wheat or whole-grain bread, crackers, tortillas, cereal, and pasta. Unsweetened oatmeal. Quinoa. Brown or wild rice. Meats and other proteins Seafood. Poultry without skin. Lean cuts of poultry and beef. Tofu. Nuts. Seeds. Dairy Low-fat or fat-free dairy products such as milk, yogurt, and cheese. The items listed above may not be a complete list of foods and beverages you can eat. Contact a dietitian for more information. What foods should I avoid? Fruits Fruits canned with  syrup. Vegetables Canned vegetables. Frozen vegetables with butter or cream sauce. Grains Refined white flour and flour products such as bread, pasta, snack foods, and cereals. Avoid all processed foods. Meats and other proteins Fatty cuts of meat. Poultry with skin. Breaded or fried meats. Processed meat. Avoid saturated fats. Dairy Full-fat yogurt, cheese, or milk. Beverages Sweetened drinks, such as soda or iced tea. The items listed above may not be a complete list of foods and beverages you should avoid. Contact a dietitian for more information. Questions to ask a health care provider  Do I need to meet with a diabetes educator?  Do I need to meet with a dietitian?  What number can I call if I have questions?  When are the best times to check my blood glucose? Where to find more information:  American Diabetes Association: diabetes.org  Academy of Nutrition and Dietetics: www.eatright.org  National Institute of Diabetes and Digestive and Kidney Diseases: www.niddk.nih.gov  Association of Diabetes Care and Education Specialists: www.diabeteseducator.org Summary  It is important to have healthy eating   habits because your blood sugar (glucose) levels are greatly affected by what you eat and drink.  A healthy meal plan will help you control your blood glucose and maintain a healthy lifestyle.  Your health care provider may recommend that you work with a dietitian to make a meal plan that is best for you.  Keep in mind that carbohydrates (carbs) and alcohol have immediate effects on your blood glucose levels. It is important to count carbs and to use alcohol carefully. This information is not intended to replace advice given to you by your health care provider. Make sure you discuss any questions you have with your health care provider. Document Revised: 09/04/2019 Document Reviewed: 09/04/2019 Elsevier Patient Education  2021 [employer].  

## 2021-01-13 LAB — CBC WITH DIFFERENTIAL/PLATELET
Basophils Absolute: 0.1 10*3/uL (ref 0.0–0.2)
Basos: 1 %
EOS (ABSOLUTE): 0.1 10*3/uL (ref 0.0–0.4)
Eos: 2 %
Hematocrit: 52.4 % — ABNORMAL HIGH (ref 34.0–46.6)
Hemoglobin: 17.6 g/dL — ABNORMAL HIGH (ref 11.1–15.9)
Immature Grans (Abs): 0.1 10*3/uL (ref 0.0–0.1)
Immature Granulocytes: 1 %
Lymphocytes Absolute: 2.2 10*3/uL (ref 0.7–3.1)
Lymphs: 25 %
MCH: 30.4 pg (ref 26.6–33.0)
MCHC: 33.6 g/dL (ref 31.5–35.7)
MCV: 91 fL (ref 79–97)
Monocytes Absolute: 0.5 10*3/uL (ref 0.1–0.9)
Monocytes: 6 %
Neutrophils Absolute: 5.7 10*3/uL (ref 1.4–7.0)
Neutrophils: 65 %
Platelets: 154 10*3/uL (ref 150–450)
RBC: 5.79 x10E6/uL — ABNORMAL HIGH (ref 3.77–5.28)
RDW: 13.9 % (ref 11.7–15.4)
WBC: 8.7 10*3/uL (ref 3.4–10.8)

## 2021-01-13 LAB — COMPREHENSIVE METABOLIC PANEL
ALT: 9 IU/L (ref 0–32)
AST: 15 IU/L (ref 0–40)
Albumin/Globulin Ratio: 1.5 (ref 1.2–2.2)
Albumin: 4.1 g/dL (ref 3.8–4.8)
Alkaline Phosphatase: 62 IU/L (ref 44–121)
BUN/Creatinine Ratio: 17 (ref 9–23)
BUN: 11 mg/dL (ref 6–24)
Bilirubin Total: 0.3 mg/dL (ref 0.0–1.2)
CO2: 23 mmol/L (ref 20–29)
Calcium: 9.1 mg/dL (ref 8.7–10.2)
Chloride: 100 mmol/L (ref 96–106)
Creatinine, Ser: 0.63 mg/dL (ref 0.57–1.00)
Globulin, Total: 2.8 g/dL (ref 1.5–4.5)
Glucose: 145 mg/dL — ABNORMAL HIGH (ref 65–99)
Potassium: 3.8 mmol/L (ref 3.5–5.2)
Sodium: 140 mmol/L (ref 134–144)
Total Protein: 6.9 g/dL (ref 6.0–8.5)
eGFR: 110 mL/min/{1.73_m2} (ref >59)

## 2021-01-13 LAB — LIPID PANEL
Chol/HDL Ratio: 8 ratio — ABNORMAL HIGH (ref 0.0–4.4)
Cholesterol, Total: 208 mg/dL — ABNORMAL HIGH (ref 100–199)
HDL: 26 mg/dL — ABNORMAL LOW (ref >39)
LDL Chol Calc (NIH): 89 mg/dL (ref 0–99)
Triglycerides: 566 mg/dL (ref 0–149)
VLDL Cholesterol Cal: 93 mg/dL — ABNORMAL HIGH (ref 5–40)

## 2021-01-13 LAB — MAGNESIUM: Magnesium: 2.2 mg/dL (ref 1.6–2.3)

## 2021-01-13 LAB — VITAMIN D 25 HYDROXY (VIT D DEFICIENCY, FRACTURES): Vit D, 25-Hydroxy: 11.7 ng/mL — ABNORMAL LOW (ref 30.0–100.0)

## 2021-01-13 LAB — TSH: TSH: 1.94 u[IU]/mL (ref 0.450–4.500)

## 2021-01-13 LAB — VITAMIN B12: Vitamin B-12: 554 pg/mL (ref 232–1245)

## 2021-01-19 ENCOUNTER — Other Ambulatory Visit (HOSPITAL_COMMUNITY): Payer: Self-pay

## 2021-01-19 MED FILL — Insulin Glargine-yfgn Soln Pen-Injector 100 Unit/ML: SUBCUTANEOUS | 30 days supply | Qty: 30 | Fill #0 | Status: AC

## 2021-02-05 ENCOUNTER — Telehealth: Payer: Self-pay | Admitting: Physician Assistant

## 2021-02-05 DIAGNOSIS — Z1211 Encounter for screening for malignant neoplasm of colon: Secondary | ICD-10-CM

## 2021-02-05 NOTE — Telephone Encounter (Signed)
Referral for colonoscopy placed per protocol. AS, CMA

## 2021-02-24 ENCOUNTER — Other Ambulatory Visit (HOSPITAL_COMMUNITY): Payer: Self-pay

## 2021-02-24 ENCOUNTER — Other Ambulatory Visit: Payer: Self-pay | Admitting: Family Medicine

## 2021-02-24 ENCOUNTER — Other Ambulatory Visit: Payer: Self-pay | Admitting: Physician Assistant

## 2021-02-24 DIAGNOSIS — F32A Depression, unspecified: Secondary | ICD-10-CM

## 2021-02-24 MED ORDER — INSULIN PEN NEEDLE 31G X 8 MM MISC
3 refills | Status: DC
  Filled 2021-02-24: qty 100, 90d supply, fill #0

## 2021-02-24 MED ORDER — DULOXETINE HCL 30 MG PO CPEP
30.0000 mg | ORAL_CAPSULE | Freq: Every day | ORAL | 0 refills | Status: DC
  Filled 2021-02-24: qty 90, 90d supply, fill #0

## 2021-02-24 MED ORDER — FREESTYLE LITE TEST VI STRP
ORAL_STRIP | 1 refills | Status: DC
  Filled 2021-02-24: qty 100, 25d supply, fill #0

## 2021-02-24 MED FILL — Dapagliflozin Propanediol Tab 10 MG (Base Equivalent): ORAL | 30 days supply | Qty: 30 | Fill #0 | Status: AC

## 2021-02-24 MED FILL — Atorvastatin Calcium Tab 10 MG (Base Equivalent): ORAL | 90 days supply | Qty: 90 | Fill #0 | Status: AC

## 2021-02-24 MED FILL — Omega-3-acid Ethyl Esters Cap 1 GM: ORAL | 30 days supply | Qty: 120 | Fill #0 | Status: AC

## 2021-02-24 MED FILL — Metformin HCl Tab 1000 MG: ORAL | 90 days supply | Qty: 180 | Fill #0 | Status: AC

## 2021-02-24 MED FILL — Hydrochlorothiazide Tab 25 MG: ORAL | 90 days supply | Qty: 90 | Fill #0 | Status: AC

## 2021-02-24 MED FILL — Duloxetine HCl Enteric Coated Pellets Cap 60 MG (Base Eq): ORAL | 90 days supply | Qty: 90 | Fill #0 | Status: AC

## 2021-02-24 MED FILL — Insulin Glargine-yfgn Soln Pen-Injector 100 Unit/ML: SUBCUTANEOUS | 30 days supply | Qty: 30 | Fill #1 | Status: AC

## 2021-02-24 MED FILL — Lisinopril Tab 5 MG: ORAL | 90 days supply | Qty: 90 | Fill #0 | Status: AC

## 2021-02-25 ENCOUNTER — Other Ambulatory Visit (HOSPITAL_COMMUNITY): Payer: Self-pay

## 2021-03-03 ENCOUNTER — Encounter: Payer: Self-pay | Admitting: Physician Assistant

## 2021-03-03 ENCOUNTER — Other Ambulatory Visit (HOSPITAL_COMMUNITY): Payer: Self-pay

## 2021-03-03 ENCOUNTER — Ambulatory Visit (INDEPENDENT_AMBULATORY_CARE_PROVIDER_SITE_OTHER): Payer: No Typology Code available for payment source | Admitting: Physician Assistant

## 2021-03-03 ENCOUNTER — Other Ambulatory Visit: Payer: Self-pay

## 2021-03-03 VITALS — BP 130/75 | HR 85 | Temp 99.3°F | Ht 65.0 in | Wt 262.4 lb

## 2021-03-03 DIAGNOSIS — K921 Melena: Secondary | ICD-10-CM

## 2021-03-03 DIAGNOSIS — K5792 Diverticulitis of intestine, part unspecified, without perforation or abscess without bleeding: Secondary | ICD-10-CM

## 2021-03-03 LAB — HEMOCCULT GUIAC POC 1CARD (OFFICE)
Card #1 Date: 5242022
Fecal Occult Blood, POC: NEGATIVE

## 2021-03-03 MED ORDER — CIPROFLOXACIN HCL 500 MG PO TABS
500.0000 mg | ORAL_TABLET | Freq: Two times a day (BID) | ORAL | 0 refills | Status: AC
  Filled 2021-03-03: qty 14, 7d supply, fill #0

## 2021-03-03 MED ORDER — METRONIDAZOLE 500 MG PO TABS
500.0000 mg | ORAL_TABLET | Freq: Three times a day (TID) | ORAL | 0 refills | Status: AC
  Filled 2021-03-03: qty 21, 7d supply, fill #0

## 2021-03-03 NOTE — Progress Notes (Signed)
Acute Office Visit  Subjective:    Patient ID: Renee Ho, female    DOB: 10-07-1976, 45 y.o.   MRN: [id number]  Chief Complaint  Patient presents with  . Acute Visit  . Diverticulitis    HPI Patient is in today for c/o of bloody diarrhea and lower abdominal pain. Feels like her intestines are "burning." Symptoms started Saturday (3 days ago) and states had to leave work Sunday. Reports remote history of diverticulitis and has a flare-up about 2-3 times per year which are usually mild, states this flare-up is more severe. Denies fever, chills, or vomiting. Patient reports nausea related to medication side effect. Yesterday started a liquid diet which has helped some. No bloody stool today.  Past Medical History:  Diagnosis Date  . Depression   . Diabetes mellitus   . Diabetes mellitus without complication ([place])   . Diverticula of intestine   . High cholesterol   . Hypertension   . OSA on CPAP    CPAP pressure= 15  . Polycystic ovarian syndrome   . Sleep apnea    on CPAP with pressure setting= 15    Past Surgical History:  Procedure Laterality Date  . ABDOMINAL HYSTERECTOMY    . CYST REMOVAL NECK    . KNEE ARTHROSCOPY  07/11/2012   Procedure: ARTHROSCOPY KNEE;  Surgeon: Meredith Pel, MD;  Location: [facility];  Service: Orthopedics;  Laterality: Right;  Right knee arthroscopy with debridement  . KNEE SURGERY    . OVARIAN CYST REMOVAL    . WISDOM TOOTH EXTRACTION  05/11/2017    Family History  Problem Relation Age of Onset  . Cancer Mother        breast  . Mental illness Mother   . Depression Mother   . Hyperlipidemia Mother   . Breast cancer Mother   . Healthy Father   . Hyperlipidemia Father   . Cancer Maternal Grandfather        unknown type  . Depression Maternal Grandfather   . Hyperlipidemia Maternal Grandfather   . Stroke Paternal Grandmother   . Hyperlipidemia Paternal Grandmother   . Diabetes Maternal Grandmother   . Hyperlipidemia  Maternal Grandmother   . Hyperlipidemia Paternal Grandfather     Social History   Socioeconomic History  . Marital status: Married    Spouse name: Not on file  . Number of children: 1  . Years of education: College  . Highest education level: Not on file  Occupational History  . Not on file  Tobacco Use  . Smoking status: Current Every Day Smoker    Packs/day: 1.00    Years: 20.00    Pack years: 20.00    Types: Cigarettes  . Smokeless tobacco: Never Used  . Tobacco comment: stopped smoking on 02/17/17  Substance and Sexual Activity  . Alcohol use: No    Alcohol/week: 0.0 standard drinks    Comment: Rarely.  . Drug use: No  . Sexual activity: Yes    Birth control/protection: Surgical  Other Topics Concern  . Not on file  Social History Narrative   ** Merged History Encounter **   Drinks diet Dr. Malachi Bonds about 1 a day.        Social Determinants of Health   Financial Resource Strain: Not on file  Food Insecurity: Not on file  Transportation Needs: Not on file  Physical Activity: Not on file  Stress: Not on file  Social Connections: Not on file  Intimate Partner Violence: Not  on file    Outpatient Medications Prior to Visit  Medication Sig Dispense Refill  . atorvastatin (LIPITOR) 10 MG tablet TAKE 1 TABLET (10 MG TOTAL) BY MOUTH DAILY. 90 tablet 1  . blood glucose meter kit and supplies KIT Dispense based on patient and insurance preference. Use up to four times daily as directed. (FOR ICD-9 250.00, 250.01). 1 each 0  . dapagliflozin propanediol (FARXIGA) 10 MG TABS tablet TAKE 1 TABLET (10 MG TOTAL) BY MOUTH DAILY. 90 tablet 1  . DULoxetine (CYMBALTA) 30 MG capsule Take 1 capsule (30 mg total) by mouth daily. 90 capsule 0  . DULoxetine (CYMBALTA) 60 MG capsule TAKE 1 CAPSULE (60 MG TOTAL) BY MOUTH DAILY. 90 capsule 1  . glucose blood (FREESTYLE LITE) test strip USE AS DIRECTED THREE TO FOUR TIMES DAILY 100 each 1  . hydrochlorothiazide (HYDRODIURIL) 25 MG tablet  TAKE 1 TABLET (25 MG TOTAL) BY MOUTH DAILY. 90 tablet 1  . hydrOXYzine (VISTARIL) 25 MG capsule TAKE 1 CAPSULE (25 MG TOTAL) BY MOUTH 3 (THREE) TIMES DAILY AS NEEDED. 30 capsule 0  . ibuprofen (ADVIL) 200 MG tablet Take 800 mg by mouth daily as needed for headache or moderate pain.    . Insulin Glargine-yfgn 100 UNIT/ML SOPN Inject 100 Units into the skin at bedtime. 15 mL 5  . Insulin Pen Needle 31G X 8 MM MISC Use as directed (Patient taking differently: Use as directed) 100 each 3  . Insulin Syringe-Needle U-100 (TRUEPLUS INSULIN SYRINGE) 30G X 5/16" 0.5 ML MISC USE AS DIRECTED EVERY DAY with insulin pen 100 each 3  . lisinopril (ZESTRIL) 5 MG tablet TAKE 1 TABLET (5 MG TOTAL) BY MOUTH DAILY. 90 tablet 1  . metFORMIN (GLUCOPHAGE) 1000 MG tablet TAKE 1 TABLET BY MOUTH TWICE DAILY WITH MEALS. 180 tablet 1  . omega-3 acid ethyl esters (LOVAZA) 1 g capsule TAKE 2 CAPSULES BY MOUTH TWICE A DAY. 120 capsule 1  . Semaglutide,0.25 or 0.5MG/DOS, (OZEMPIC, 0.25 OR 0.5 MG/DOSE,) 2 MG/1.5ML SOPN Inject 0.25 mg under the skin once a week for 4 weeks. Then increase to 0.5 mg once a week. (Patient taking differently: Inject 0.25 mg under the skin once a week for 4 weeks. Then increase to 0.5 mg once a week.) 3 mL 1   No facility-administered medications prior to visit.    Allergies  Allergen Reactions  . Morphine And Related Other (See Comments)    Migraines  . Reglan [Metoclopramide] Other (See Comments)    Psychotic episodes  . Toradol [Ketorolac Tromethamine] Rash    Review of Systems Review of Systems:  A fourteen system review of systems was performed and found to be positive as per HPI.  Objective:    Physical Exam General:  Answers questions appropriately, in no acute distress, non-toxic appearing Neuro:  Alert and oriented,  extra-ocular muscles intact  HEENT:  Normocephalic, atraumatic, neck supple Skin:  no gross rash, warm, pink. Cardiac:  RRR Respiratory:  ECTA B/L, Not using  accessory muscles, speaking in full sentences- unlabored. Abdomen: Soft, non-distended, NBS, tenderness of LLQ GU/rectal: No anal fissure or hemorrhoid noted, negative hemoccult  Vascular:  Ext warm, no cyanosis apprec.; cap RF less 2 sec. Psych:  No HI/SI, judgement and insight good, Euthymic mood. Full Affect.   BP 130/75   Pulse 85   Temp 99.3 F (37.4 C)   Ht '5\' 5"'  (1.651 m)   Wt 262 lb 6.4 oz (119 kg)   SpO2 96%   BMI  43.67 kg/m  Wt Readings from Last 3 Encounters:  03/03/21 262 lb 6.4 oz (119 kg)  01/12/21 277 lb 1.6 oz (125.7 kg)  10/14/20 265 lb 4.8 oz (120.3 kg)    Health Maintenance Due  Topic Date Due  . COLONOSCOPY (Pts 45-74yr Insurance coverage will need to be confirmed)  Never done  . OPHTHALMOLOGY EXAM  08/31/2019  . COVID-19 Vaccine (3 - Booster for Pfizer series) 12/04/2020    There are no preventive care reminders to display for this patient.   Lab Results  Component Value Date   TSH 1.940 01/12/2021   Lab Results  Component Value Date   WBC 8.7 01/12/2021   HGB 17.6 (H) 01/12/2021   HCT 52.4 (H) 01/12/2021   MCV 91 01/12/2021   PLT 154 01/12/2021   Lab Results  Component Value Date   NA 140 01/12/2021   K 3.8 01/12/2021   CO2 23 01/12/2021   GLUCOSE 145 (H) 01/12/2021   BUN 11 01/12/2021   CREATININE 0.63 01/12/2021   BILITOT 0.3 01/12/2021   ALKPHOS 62 01/12/2021   AST 15 01/12/2021   ALT 9 01/12/2021   PROT 6.9 01/12/2021   ALBUMIN 4.1 01/12/2021   CALCIUM 9.1 01/12/2021   ANIONGAP 10 12/22/2016   EGFR 110 01/12/2021   Lab Results  Component Value Date   CHOL 208 (H) 01/12/2021   Lab Results  Component Value Date   HDL 26 (L) 01/12/2021   Lab Results  Component Value Date   LDLCALC 89 01/12/2021   Lab Results  Component Value Date   TRIG 566 (HH) 01/12/2021   Lab Results  Component Value Date   CHOLHDL 8.0 (H) 01/12/2021   Lab Results  Component Value Date   HGBA1C 8.9 (A) 01/12/2021       Assessment &  Plan:   Problem List Items Addressed This Visit   None   Visit Diagnoses    Diverticulitis    -  Primary   Relevant Medications   metroNIDAZOLE (FLAGYL) 500 MG tablet   ciprofloxacin (CIPRO) 500 MG tablet   Other Relevant Orders   CBC w/Diff   Comp Met (CMET)   C-reactive protein   Blood in stool       Relevant Orders   POCT occult blood stool (Completed)     Diverticulitis: -Patient has signs and symptoms suggestive of acute diverticulitis. Hemoccult is negative and pt is hemodynamically stable so will start outpatient treatment with antibiotic therapy. Recommend to continue with clear diet. -Discussed red flag signs and symptoms to monitor for and advised to seek immediate medical care.  -Will collect CMP, CRP and CBC w/diff to evaluate for blood loss, inflammation, electrolytes and renal/hepatic function. -Follow up in 1 week.  Meds ordered this encounter  Medications  . metroNIDAZOLE (FLAGYL) 500 MG tablet    Sig: Take 1 tablet (500 mg total) by mouth 3 (three) times daily for 7 days.    Dispense:  21 tablet    Refill:  0    Order Specific Question:   Supervising Provider    Answer:   MBeatrice LecherD [[id number]]  . ciprofloxacin (CIPRO) 500 MG tablet    Sig: Take 1 tablet (500 mg total) by mouth 2 (two) times daily for 7 days.    Dispense:  14 tablet    Refill:  0    Order Specific Question:   Supervising Provider    Answer:   MBeatrice LecherD [[id number]]   Note:  This  note was prepared with assistance of Systems analyst. Occasional wrong-word or sound-a-like substitutions may have occurred due to the inherent limitations of voice recognition software.   [name], PA-C

## 2021-03-03 NOTE — Patient Instructions (Signed)
Diverticulitis  Diverticulitis is when small pouches in your colon (large intestine) get infected or swollen. This causes pain in the belly (abdomen) and watery poop (diarrhea). These pouches are called diverticula. The pouches form in people who have a condition called diverticulosis. What are the causes? This condition may be caused by poop (stool) that gets trapped in the pouches in your colon. The poop lets germs (bacteria) grow in the pouches. This causes the infection. What increases the risk? You are more likely to get this condition if you have small pouches in your colon. The risk is higher if:  You are overweight or very overweight (obese).  You do not exercise enough.  You drink alcohol.  You smoke or use products with tobacco in them.  You eat a diet that has a lot of red meat such as beef, pork, or lamb.  You eat a diet that does not have enough fiber in it.  You are older than 47 years of age. What are the signs or symptoms?  Pain in the belly. Pain is often on the left side, but it may be in other areas.  Fever and feeling cold.  Feeling like you may vomit.  Vomiting.  Having cramps.  Feeling full.  Changes to how often you poop.  Blood in your poop. How is this treated? Most cases are treated at home by:  Taking over-the-counter pain medicines.  Following a clear liquid diet.  Taking antibiotic medicines.  Resting. Very bad cases may need to be treated at a hospital. This may include:  Not eating or drinking.  Taking prescription pain medicine.  Getting antibiotic medicines through an IV tube.  Getting fluid and food through an IV tube.  Having surgery. When you are feeling better, your doctor may tell you to have a test to check your colon (colonoscopy). Follow these instructions at home: Medicines  Take over-the-counter and prescription medicines only as told by your doctor. These include: ? Antibiotics. ? Pain medicines. ? Fiber  pills. ? Probiotics. ? Stool softeners.  If you were prescribed an antibiotic medicine, take it as told by your doctor. Do not stop taking the antibiotic even if you start to feel better.  Ask your doctor if the medicine prescribed to you requires you to avoid driving or using machinery. Eating and drinking  Follow a diet as told by your doctor.  When you feel better, your doctor may tell you to change your diet. You may need to eat a lot of fiber. Fiber makes it easier to poop (have a bowel movement). Foods with fiber include: ? Berries. ? Beans. ? Lentils. ? Green vegetables.  Avoid eating red meat.   General instructions  Do not use any products that contain nicotine or tobacco, such as cigarettes, e-cigarettes, and chewing tobacco. If you need help quitting, ask your doctor.  Exercise 3 or more times a week. Try to get 30 minutes each time. Exercise enough to sweat and make your heart beat faster.  Keep all follow-up visits as told by your doctor. This is important. Contact a doctor if:  Your pain does not get better.  You are not pooping like normal. Get help right away if:  Your pain gets worse.  Your symptoms do not get better.  Your symptoms get worse very fast.  You have a fever.  You vomit more than one time.  You have poop that is: ? Bloody. ? Black. ? Tarry. Summary  This condition happens when   small pouches in your colon get infected or swollen.  Take medicines only as told by your doctor.  Follow a diet as told by your doctor.  Keep all follow-up visits as told by your doctor. This is important. This information is not intended to replace advice given to you by your health care provider. Make sure you discuss any questions you have with your health care provider. Document Revised: 07/09/2019 Document Reviewed: 07/09/2019 Elsevier Patient Education  2021 [employer]  

## 2021-03-04 LAB — CBC WITH DIFFERENTIAL/PLATELET
Basophils Absolute: 0.1 10*3/uL (ref 0.0–0.2)
Basos: 1 %
EOS (ABSOLUTE): 0.2 10*3/uL (ref 0.0–0.4)
Eos: 2 %
Hematocrit: 54.7 % — ABNORMAL HIGH (ref 34.0–46.6)
Hemoglobin: 18.7 g/dL — ABNORMAL HIGH (ref 11.1–15.9)
Immature Grans (Abs): 0.1 10*3/uL (ref 0.0–0.1)
Immature Granulocytes: 1 %
Lymphocytes Absolute: 3 10*3/uL (ref 0.7–3.1)
Lymphs: 24 %
MCH: 31.1 pg (ref 26.6–33.0)
MCHC: 34.2 g/dL (ref 31.5–35.7)
MCV: 91 fL (ref 79–97)
Monocytes Absolute: 0.8 10*3/uL (ref 0.1–0.9)
Monocytes: 6 %
Neutrophils Absolute: 8.1 10*3/uL — ABNORMAL HIGH (ref 1.4–7.0)
Neutrophils: 66 %
Platelets: 216 10*3/uL (ref 150–450)
RBC: 6.02 x10E6/uL — ABNORMAL HIGH (ref 3.77–5.28)
RDW: 13.9 % (ref 11.7–15.4)
WBC: 12.3 10*3/uL — ABNORMAL HIGH (ref 3.4–10.8)

## 2021-03-04 LAB — COMPREHENSIVE METABOLIC PANEL
ALT: 11 IU/L (ref 0–32)
AST: 13 IU/L (ref 0–40)
Albumin/Globulin Ratio: 1.7 (ref 1.2–2.2)
Albumin: 4.8 g/dL (ref 3.8–4.8)
Alkaline Phosphatase: 66 IU/L (ref 44–121)
BUN/Creatinine Ratio: 23 (ref 9–23)
BUN: 19 mg/dL (ref 6–24)
Bilirubin Total: 0.6 mg/dL (ref 0.0–1.2)
CO2: 20 mmol/L (ref 20–29)
Calcium: 9.7 mg/dL (ref 8.7–10.2)
Chloride: 97 mmol/L (ref 96–106)
Creatinine, Ser: 0.81 mg/dL (ref 0.57–1.00)
Globulin, Total: 2.9 g/dL (ref 1.5–4.5)
Glucose: 161 mg/dL — ABNORMAL HIGH (ref 65–99)
Potassium: 4.6 mmol/L (ref 3.5–5.2)
Sodium: 137 mmol/L (ref 134–144)
Total Protein: 7.7 g/dL (ref 6.0–8.5)
eGFR: 90 mL/min/{1.73_m2} (ref >59)

## 2021-03-04 LAB — C-REACTIVE PROTEIN: CRP: 3 mg/L (ref 0–10)

## 2021-03-12 ENCOUNTER — Ambulatory Visit: Payer: No Typology Code available for payment source | Admitting: Physician Assistant

## 2021-03-30 MED FILL — Dapagliflozin Propanediol Tab 10 MG (Base Equivalent): ORAL | 30 days supply | Qty: 30 | Fill #1 | Status: AC

## 2021-03-30 MED FILL — Insulin Glargine-yfgn Soln Pen-Injector 100 Unit/ML: SUBCUTANEOUS | 30 days supply | Qty: 30 | Fill #2 | Status: AC

## 2021-03-31 ENCOUNTER — Other Ambulatory Visit (HOSPITAL_COMMUNITY): Payer: Self-pay

## 2021-04-14 ENCOUNTER — Ambulatory Visit: Payer: No Typology Code available for payment source | Admitting: Physician Assistant

## 2021-04-21 ENCOUNTER — Other Ambulatory Visit (HOSPITAL_COMMUNITY): Payer: Self-pay

## 2021-04-21 ENCOUNTER — Other Ambulatory Visit: Payer: Self-pay

## 2021-04-21 ENCOUNTER — Ambulatory Visit (INDEPENDENT_AMBULATORY_CARE_PROVIDER_SITE_OTHER): Payer: No Typology Code available for payment source | Admitting: Physician Assistant

## 2021-04-21 ENCOUNTER — Encounter: Payer: Self-pay | Admitting: Physician Assistant

## 2021-04-21 VITALS — BP 128/74 | HR 78 | Temp 98.1°F | Ht 65.0 in | Wt 263.8 lb

## 2021-04-21 DIAGNOSIS — M7661 Achilles tendinitis, right leg: Secondary | ICD-10-CM

## 2021-04-21 DIAGNOSIS — E1159 Type 2 diabetes mellitus with other circulatory complications: Secondary | ICD-10-CM

## 2021-04-21 DIAGNOSIS — Z716 Tobacco abuse counseling: Secondary | ICD-10-CM

## 2021-04-21 DIAGNOSIS — I152 Hypertension secondary to endocrine disorders: Secondary | ICD-10-CM

## 2021-04-21 DIAGNOSIS — Z794 Long term (current) use of insulin: Secondary | ICD-10-CM | POA: Diagnosis not present

## 2021-04-21 DIAGNOSIS — E1165 Type 2 diabetes mellitus with hyperglycemia: Secondary | ICD-10-CM | POA: Diagnosis not present

## 2021-04-21 DIAGNOSIS — F32A Depression, unspecified: Secondary | ICD-10-CM

## 2021-04-21 LAB — POCT GLYCOSYLATED HEMOGLOBIN (HGB A1C): Hemoglobin A1C: 6.9 % — AB (ref 4.0–5.6)

## 2021-04-21 MED ORDER — DAPAGLIFLOZIN PROPANEDIOL 10 MG PO TABS
10.0000 mg | ORAL_TABLET | Freq: Every day | ORAL | 1 refills | Status: DC
  Filled 2021-04-21: qty 90, fill #0
  Filled 2021-05-14: qty 90, 90d supply, fill #0
  Filled 2021-08-06: qty 90, 90d supply, fill #1

## 2021-04-21 MED ORDER — DULOXETINE HCL 30 MG PO CPEP
30.0000 mg | ORAL_CAPSULE | Freq: Every day | ORAL | 1 refills | Status: DC
  Filled 2021-04-21 – 2021-06-24 (×2): qty 90, 90d supply, fill #0
  Filled 2021-10-04: qty 90, 90d supply, fill #1

## 2021-04-21 MED ORDER — OZEMPIC (0.25 OR 0.5 MG/DOSE) 2 MG/1.5ML ~~LOC~~ SOPN
0.5000 mg | PEN_INJECTOR | SUBCUTANEOUS | 2 refills | Status: DC
  Filled 2021-04-21: qty 1.5, 28d supply, fill #0
  Filled 2021-05-20: qty 1.5, 28d supply, fill #1

## 2021-04-21 MED ORDER — ATORVASTATIN CALCIUM 10 MG PO TABS
10.0000 mg | ORAL_TABLET | Freq: Every day | ORAL | 1 refills | Status: DC
  Filled 2021-04-21: qty 90, fill #0
  Filled 2021-06-24: qty 90, 90d supply, fill #0
  Filled 2021-10-04: qty 90, 90d supply, fill #1

## 2021-04-21 MED ORDER — HYDROCHLOROTHIAZIDE 25 MG PO TABS
25.0000 mg | ORAL_TABLET | Freq: Every day | ORAL | 1 refills | Status: DC
  Filled 2021-04-21: qty 90, fill #0
  Filled 2021-06-24: qty 90, 90d supply, fill #0
  Filled 2021-10-04: qty 90, 90d supply, fill #1

## 2021-04-21 MED ORDER — DULOXETINE HCL 60 MG PO CPEP
60.0000 mg | ORAL_CAPSULE | Freq: Every day | ORAL | 1 refills | Status: DC
  Filled 2021-04-21: qty 90, fill #0
  Filled 2021-06-24: qty 90, 90d supply, fill #0
  Filled 2021-10-04: qty 90, 90d supply, fill #1

## 2021-04-21 MED ORDER — METHYLPREDNISOLONE 4 MG PO TBPK
ORAL_TABLET | ORAL | 0 refills | Status: DC
  Filled 2021-04-21: qty 21, 6d supply, fill #0

## 2021-04-21 MED ORDER — LISINOPRIL 5 MG PO TABS
5.0000 mg | ORAL_TABLET | Freq: Every day | ORAL | 1 refills | Status: DC
  Filled 2021-04-21: qty 90, fill #0
  Filled 2021-06-24: qty 90, 90d supply, fill #0
  Filled 2021-10-14: qty 90, 90d supply, fill #1

## 2021-04-21 MED ORDER — INSULIN GLARGINE-YFGN 100 UNIT/ML ~~LOC~~ SOPN
100.0000 [IU] | PEN_INJECTOR | Freq: Every day | SUBCUTANEOUS | 5 refills | Status: DC
  Filled 2021-04-21: qty 15, 15d supply, fill #0
  Filled 2021-04-23: qty 90, 90d supply, fill #0

## 2021-04-21 NOTE — Assessment & Plan Note (Signed)
-  PHQ-9 score of 6, improved from prior. -Continue current medication regimen. Provided refills. -Will continue to monitor.

## 2021-04-21 NOTE — Assessment & Plan Note (Signed)
-  A1c has significantly improved from 8.9 to 6.9, will continue current medication regimen. Provided refills. -Encouraged to continue with monitoring carbohydrate and glucose intake. -Continue to stay as active as possible. -Will continue to monitor.

## 2021-04-21 NOTE — Assessment & Plan Note (Signed)
-  Controlled. -Continue current medication regimen.  -Will continue to monitor and repeat CMP for medication monitoring at follow up visit.

## 2021-04-21 NOTE — Progress Notes (Signed)
Established Patient Office Visit  Subjective:  Patient ID: Renee Ho, female    DOB: 1976/06/21  Age: 45 y.o. MRN: [id number]  CC:  Chief Complaint  Patient presents with   Hypertension   Diabetes    HPI [name] presents for follow up on diabetes mellitus, hypertension and mood management. Patient has c/o right foot pain x 2 months. States pain started after playing kickball. Pain is exacerbated by pressure such as when wearing her tennis shoes, and does have some swelling and redness by the end of the day/work shift. Denies injury or trauma.   Diabetes: Pt denies increased urination or thirst. Pt reports medication compliance.  Tolerating Ozempic without significant issues.  No hypoglycemic events. Checking glucose at home. States has noticed FBS are better, <200. Has been trying to monitor her carbohydrate and glucose intake.  HTN: Pt denies chest pain, palpitations, dizziness, orthopnea, or lower extremity swelling. Taking medication as directed without side effects. Reports blood pressure has been stable, <130/85.  Mood: Taking medication as directed without issues. States mood has been stable. Denies mood changes or fluctuations or SI/HI.  Tobacco use: Patient reports has stopped smoking two days ago. In the past has tried nicotine gum and patch which were ineffective. Sometimes vapes to help cope with cravings. Verbalizes vaping is not better and eventually will quit.   Past Medical History:  Diagnosis Date   Depression    Diabetes mellitus    Diabetes mellitus without complication ([place])    Diverticula of intestine    High cholesterol    Hypertension    OSA on CPAP    CPAP pressure= 15   Polycystic ovarian syndrome    Sleep apnea    on CPAP with pressure setting= 15    Past Surgical History:  Procedure Laterality Date   ABDOMINAL HYSTERECTOMY     CYST REMOVAL NECK     KNEE ARTHROSCOPY  07/11/2012   Procedure: ARTHROSCOPY KNEE;   Surgeon: Meredith Pel, MD;  Location: [facility];  Service: Orthopedics;  Laterality: Right;  Right knee arthroscopy with debridement   KNEE SURGERY     OVARIAN CYST REMOVAL     WISDOM TOOTH EXTRACTION  05/11/2017    Family History  Problem Relation Age of Onset   Cancer Mother        breast   Mental illness Mother    Depression Mother    Hyperlipidemia Mother    Breast cancer Mother    Healthy Father    Hyperlipidemia Father    Cancer Maternal Grandfather        unknown type   Depression Maternal Grandfather    Hyperlipidemia Maternal Grandfather    Stroke Paternal Grandmother    Hyperlipidemia Paternal Grandmother    Diabetes Maternal Grandmother    Hyperlipidemia Maternal Grandmother    Hyperlipidemia Paternal Grandfather     Social History   Socioeconomic History   Marital status: Married    Spouse name: Not on file   Number of children: 1   Years of education: College   Highest education level: Not on file  Occupational History   Not on file  Tobacco Use   Smoking status: Every Day    Packs/day: 1.00    Years: 20.00    Pack years: 20.00    Types: Cigarettes   Smokeless tobacco: Never   Tobacco comments:    stopped smoking on 02/17/17  Substance and Sexual Activity   Alcohol use: No  Alcohol/week: 0.0 standard drinks    Comment: Rarely.   Drug use: No   Sexual activity: Yes    Birth control/protection: Surgical  Other Topics Concern   Not on file  Social History Narrative   ** Merged History Encounter **   Drinks diet Dr. Malachi Bonds about 1 a day.        Social Determinants of Health   Financial Resource Strain: Not on file  Food Insecurity: Not on file  Transportation Needs: Not on file  Physical Activity: Not on file  Stress: Not on file  Social Connections: Not on file  Intimate Partner Violence: Not on file    Outpatient Medications Prior to Visit  Medication Sig Dispense Refill   blood glucose meter kit and supplies KIT Dispense based on  patient and insurance preference. Use up to four times daily as directed. (FOR ICD-9 250.00, 250.01). 1 each 0   glucose blood (FREESTYLE LITE) test strip USE AS DIRECTED THREE TO FOUR TIMES DAILY 100 each 1   ibuprofen (ADVIL) 200 MG tablet Take 800 mg by mouth daily as needed for headache or moderate pain.     Insulin Pen Needle 31G X 8 MM MISC Use as directed (Patient taking differently: Use as directed) 100 each 3   Insulin Syringe-Needle U-100 (TRUEPLUS INSULIN SYRINGE) 30G X 5/16" 0.5 ML MISC USE AS DIRECTED EVERY DAY with insulin pen 100 each 3   metFORMIN (GLUCOPHAGE) 1000 MG tablet TAKE 1 TABLET BY MOUTH TWICE DAILY WITH MEALS. 180 tablet 1   omega-3 acid ethyl esters (LOVAZA) 1 g capsule TAKE 2 CAPSULES BY MOUTH TWICE A DAY. 120 capsule 1   atorvastatin (LIPITOR) 10 MG tablet TAKE 1 TABLET (10 MG TOTAL) BY MOUTH DAILY. 90 tablet 1   dapagliflozin propanediol (FARXIGA) 10 MG TABS tablet TAKE 1 TABLET (10 MG TOTAL) BY MOUTH DAILY. 90 tablet 1   DULoxetine (CYMBALTA) 30 MG capsule Take 1 capsule (30 mg total) by mouth daily. 90 capsule 0   DULoxetine (CYMBALTA) 60 MG capsule TAKE 1 CAPSULE (60 MG TOTAL) BY MOUTH DAILY. 90 capsule 1   hydrochlorothiazide (HYDRODIURIL) 25 MG tablet TAKE 1 TABLET (25 MG TOTAL) BY MOUTH DAILY. 90 tablet 1   hydrOXYzine (VISTARIL) 25 MG capsule TAKE 1 CAPSULE (25 MG TOTAL) BY MOUTH 3 (THREE) TIMES DAILY AS NEEDED. 30 capsule 0   Insulin Glargine-yfgn 100 UNIT/ML SOPN Inject 100 Units into the skin at bedtime. 15 mL 5   lisinopril (ZESTRIL) 5 MG tablet TAKE 1 TABLET (5 MG TOTAL) BY MOUTH DAILY. 90 tablet 1   Semaglutide,0.25 or 0.5MG/DOS, (OZEMPIC, 0.25 OR 0.5 MG/DOSE,) 2 MG/1.5ML SOPN Inject 0.25 mg under the skin once a week for 4 weeks. Then increase to 0.5 mg once a week. (Patient taking differently: Inject 0.25 mg under the skin once a week for 4 weeks. Then increase to 0.5 mg once a week.) 3 mL 1   No facility-administered medications prior to visit.     Allergies  Allergen Reactions   Morphine And Related Other (See Comments)    Migraines   Reglan [Metoclopramide] Other (See Comments)    Psychotic episodes   Toradol [Ketorolac Tromethamine] Rash    ROS Review of Systems A fourteen system review of systems was performed and found to be positive as per HPI.   Objective:    Physical Exam General:  Well Developed, well nourished, in no acute distress  Neuro:  Alert and oriented,  extra-ocular muscles intact  HEENT:  Normocephalic, atraumatic, neck supple Skin:  no gross rash, warm, pink. Cardiac:  RRR Respiratory:  ECTA B/L, Not using accessory muscles, speaking in full sentences- unlabored. Vascular:  Ext warm, no cyanosis apprec.; cap RF less 2 sec. MSK: Good ROM and strength of both feet, tenderness and swelling at tendon insertion of right foot, no deformity noted Psych:  No HI/SI, judgement and insight good, Euthymic mood. Full Affect.  BP 128/74   Pulse 78   Temp 98.1 F (36.7 C)   Ht _0  (1.651 m)   Wt 263 lb 12.8 oz (119.7 kg)   SpO2 98%   BMI 43.90 kg/m  Wt Readings from Last 3 Encounters:  04/21/21 263 lb 12.8 oz (119.7 kg)  03/03/21 262 lb 6.4 oz (119 kg)  01/12/21 277 lb 1.6 oz (125.7 kg)     Health Maintenance Due  Topic Date Due   Pneumococcal Vaccine 31-41 Years old (2 - PCV) 07/18/2014   COLONOSCOPY (Pts 45-82yr Insurance coverage will need to be confirmed)  Never done   OPHTHALMOLOGY EXAM  08/31/2019   COVID-19 Vaccine (3 - [place] 12/04/2020   FOOT EXAM  04/07/2021    There are no preventive care reminders to display for this patient.  Lab Results  Component Value Date   TSH 1.940 01/12/2021   Lab Results  Component Value Date   WBC 12.3 (H) 03/03/2021   HGB 18.7 (H) 03/03/2021   HCT 54.7 (H) 03/03/2021   MCV 91 03/03/2021   PLT 216 03/03/2021   Lab Results  Component Value Date   NA 137 03/03/2021   K 4.6 03/03/2021   CO2 20 03/03/2021   GLUCOSE 161 (H)  03/03/2021   BUN 19 03/03/2021   CREATININE 0.81 03/03/2021   BILITOT 0.6 03/03/2021   ALKPHOS 66 03/03/2021   AST 13 03/03/2021   ALT 11 03/03/2021   PROT 7.7 03/03/2021   ALBUMIN 4.8 03/03/2021   CALCIUM 9.7 03/03/2021   ANIONGAP 10 12/22/2016   EGFR 90 03/03/2021   Lab Results  Component Value Date   CHOL 208 (H) 01/12/2021   Lab Results  Component Value Date   HDL 26 (L) 01/12/2021   Lab Results  Component Value Date   LDLCALC 89 01/12/2021   Lab Results  Component Value Date   TRIG 566 ([place] 01/12/2021   Lab Results  Component Value Date   CHOLHDL 8.0 (H) 01/12/2021   Lab Results  Component Value Date   HGBA1C 6.9 (A) 04/21/2021      Assessment & Plan:   Problem List Items Addressed This Visit       Cardiovascular and Mediastinum   Hypertension associated with diabetes ([place]    -Controlled. -Continue current medication regimen.  -Will continue to monitor and repeat CMP for medication monitoring at follow up visit.       Relevant Medications   atorvastatin (LIPITOR) 10 MG tablet   dapagliflozin propanediol (FARXIGA) 10 MG TABS tablet   hydrochlorothiazide (HYDRODIURIL) 25 MG tablet   Insulin Glargine-yfgn 100 UNIT/ML SOPN   lisinopril (ZESTRIL) 5 MG tablet   Semaglutide,0.25 or 0.5MG/DOS, (OZEMPIC, 0.25 OR 0.5 MG/DOSE,) 2 MG/1.5ML SOPN     Endocrine   Type 2 diabetes mellitus with hyperglycemia, with long-term current use of insulin ([facility]) - Primary    -A1c has significantly improved from 8.9 to 6.9, will continue current medication regimen. Provided refills. -Encouraged to continue with monitoring carbohydrate and glucose intake. -Continue to stay as active as possible. -  Will continue to monitor.       Relevant Medications   atorvastatin (LIPITOR) 10 MG tablet   dapagliflozin propanediol (FARXIGA) 10 MG TABS tablet   Insulin Glargine-yfgn 100 UNIT/ML SOPN   lisinopril (ZESTRIL) 5 MG tablet   Semaglutide,0.25 or 0.5MG/DOS, (OZEMPIC, 0.25 OR  0.5 MG/DOSE,) 2 MG/1.5ML SOPN   Other Relevant Orders   POCT glycosylated hemoglobin (Hb A1C) (Completed)     Other   Depression    -PHQ-9 score of 6, improved from prior. -Continue current medication regimen. Provided refills. -Will continue to monitor.        Relevant Medications   DULoxetine (CYMBALTA) 30 MG capsule   DULoxetine (CYMBALTA) 60 MG capsule   Other Visit Diagnoses     Encounter for tobacco use cessation counseling       Tendonitis, Achilles, right       Relevant Medications   methylPREDNISolone (MEDROL DOSEPAK) 4 MG TBPK tablet      Encounter for tobacco use cessation counseling: -Smoking cessation instruction/counseling given:  counseled patient on the dangers of tobacco use, advised patient to stop smoking, and reviewed strategies to maximize success -Encourage to continue to avoid cigarettes and limit vaping use and eventually be able to quit.  Tendonitis, Achilles, right: -Symptoms have been ongoing for > 4 weeks with minimal improvement so will start corticosteroid therapy. DM well controlled, discussed with patient potential side effects with corticosteroid including hyperglycemia. Recommend gentle stretches and ice therapy. If symptoms fail to improve or worsen recommend referral to podiatry. Patient verbalized understanding.  Meds ordered this encounter  Medications   methylPREDNISolone (MEDROL DOSEPAK) 4 MG TBPK tablet    Sig: Takes as directed on package.    Dispense:  21 tablet    Refill:  0    Order Specific Question:   Supervising Provider    Answer:   Beatrice Lecher D [[id number]]   atorvastatin (LIPITOR) 10 MG tablet    Sig: Take 1 tablet (10 mg total) by mouth daily.    Dispense:  90 tablet    Refill:  1    Order Specific Question:   Supervising Provider    Answer:   Beatrice Lecher D [[id number]]   dapagliflozin propanediol (FARXIGA) 10 MG TABS tablet    Sig: Take 1 tablet (10 mg total) by mouth daily.    Dispense:  90 tablet    Refill:   1    Order Specific Question:   Supervising Provider    Answer:   Beatrice Lecher D [[id number]]   DULoxetine (CYMBALTA) 30 MG capsule    Sig: Take 1 capsule (30 mg total) by mouth daily.    Dispense:  90 capsule    Refill:  1    Order Specific Question:   Supervising Provider    Answer:   Beatrice Lecher D [[id number]]   DULoxetine (CYMBALTA) 60 MG capsule    Sig: Take 1 capsule (60 mg total) by mouth daily.    Dispense:  90 capsule    Refill:  1    Order Specific Question:   Supervising Provider    Answer:   Beatrice Lecher D [[id number]]   hydrochlorothiazide (HYDRODIURIL) 25 MG tablet    Sig: Take 1 tablet (25 mg total) by mouth daily.    Dispense:  90 tablet    Refill:  1    Order Specific Question:   Supervising Provider    Answer:   Beatrice Lecher D [[id number]]   Insulin Glargine-yfgn 100 UNIT/ML SOPN  Sig: Inject 100 Units into the skin at bedtime.    Dispense:  15 mL    Refill:  5    Order Specific Question:   Supervising Provider    Answer:   Beatrice Lecher D [[id number]]   lisinopril (ZESTRIL) 5 MG tablet    Sig: Take 1 tablet (5 mg total) by mouth daily.    Dispense:  90 tablet    Refill:  1    Order Specific Question:   Supervising Provider    Answer:   Beatrice Lecher D [[id number]]   Semaglutide,0.25 or 0.5MG/DOS, (OZEMPIC, 0.25 OR 0.5 MG/DOSE,) 2 MG/1.5ML SOPN    Sig: Inject 0.5 mg into the skin once a week.    Dispense:  3 mL    Refill:  2    Order Specific Question:   Supervising Provider    Answer:   Beatrice Lecher D [[id number]]    Follow-up: Return in about 4 months (around 08/22/2021) for DM, HTN, HLD, mood and FBW.   Note:  This note was prepared with assistance of Dragon voice recognition software. Occasional wrong-word or sound-a-like substitutions may have occurred due to the inherent limitations of voice recognition software.  [name], PA-C

## 2021-04-21 NOTE — Patient Instructions (Addendum)
Managing the Challenge of Quitting Smoking Quitting smoking is a physical and mental challenge. You will face cravings, withdrawal symptoms, and temptation. Before quitting, work with your health care provider to make a plan that can help you manage quitting. Preparation canhelp you quit and keep you from giving in. How to manage lifestyle changes Managing stress Stress can make you want to smoke, and wanting to smoke may cause stress. It is important to find ways to manage your stress. You might try some of the following: Practice relaxation techniques. Breathe slowly and deeply, in through your nose and out through your mouth. Listen to music. Soak in a bath or take a shower. Imagine a peaceful place or vacation. Get some support. Talk with family or friends about your stress. Join a support group. Talk with a counselor or therapist. Get some physical activity. Go for a walk, run, or bike ride. Play a favorite sport. Practice yoga.  Medicines Talk with your health care provider about medicines that might help you dealwith cravings and make quitting easier for you. Relationships Social situations can be difficult when you are quitting smoking. To manage this, you can: Avoid parties and other social situations where people might be smoking. Avoid alcohol. Leave right away if you have the urge to smoke. Explain to your family and friends that you are quitting smoking. Ask for support and let them know you might be a bit grumpy. Plan activities where smoking is not an option. General instructions Be aware that many people gain weight after they quit smoking. However, not everyone does. To keep from gaining weight, have a plan in place before you quit and stick to the plan after you quit. Your plan should include: Having healthy snacks. When you have a craving, it may help to: Eat popcorn, carrots, celery, or other cut vegetables. Chew sugar-free gum. Changing how you eat. Eat small  portion sizes at meals. Eat 4-6 small meals throughout the day instead of 1-2 large meals a day. Be mindful when you eat. Do not watch television or do other things that might distract you as you eat. Exercising regularly. Make time to exercise each day. If you do not have time for a long workout, do short bouts of exercise for 5-10 minutes several times a day. Do some form of strengthening exercise, such as weight lifting. Do some exercise that gets your heart beating and causes you to breathe deeply, such as walking fast, running, swimming, or biking. This is very important. Drinking plenty of water or other low-calorie or no-calorie drinks. Drink 6-8 glasses of water daily.  How to recognize withdrawal symptoms Your body and mind may experience discomfort as you try to get used to not having nicotine in your system. These effects are called withdrawal symptoms. They may include: Feeling hungrier than normal. Having trouble concentrating. Feeling irritable or restless. Having trouble sleeping. Feeling depressed. Craving a cigarette. To manage withdrawal symptoms: Avoid places, people, and activities that trigger your cravings. Remember why you want to quit. Get plenty of sleep. Avoid coffee and other caffeinated drinks. These may worsen some of your symptoms. These symptoms may surprise you. But be assured that they are normal to havewhen quitting smoking. How to manage cravings Come up with a plan for how to deal with your cravings. The plan should include the following: A definition of the specific situation you want to deal with. An alternative action you will take. A clear idea for how this action will help. The   name of someone who might help you with this. Cravings usually last for 5-10 minutes. Consider taking the following actions to help you with your plan to deal with cravings: Keep your mouth busy. Chew sugar-free gum. Suck on hard candies or a straw. Brush your  teeth. Keep your hands and body busy. Change to a different activity right away. Squeeze or play with a ball. Do an activity or a hobby, such as making bead jewelry, practicing needlepoint, or working with wood. Mix up your normal routine. Take a short exercise break. Go for a quick walk or run up and down stairs. Focus on doing something kind or helpful for someone else. Call a friend or family member to talk during a craving. Join a support group. Contact a quitline. Where to find support To get help or find a support group: Call the National Cancer Institute's Smoking Quitline: 1-800-QUIT NOW (706)510-8907) Visit the website of the Substance Abuse and Mental Health Services Administration: SkateOasis.com.pt Text QUIT to SmokefreeTXT: 585277 Where to find more information Visit these websites to find more information on quitting smoking: National Cancer Institute: www.smokefree.gov American Lung Association: www.lung.org American Cancer Society: www.cancer.org Centers for Disease Control and Prevention: FootballExhibition.com.br American Heart Association: www.heart.org Contact a health care provider if: You want to change your plan for quitting. The medicines you are taking are not helping. Your eating feels out of control or you cannot sleep. Get help right away if: You feel depressed or become very anxious. Summary Quitting smoking is a physical and mental challenge. You will face cravings, withdrawal symptoms, and temptation to smoke again. Preparation can help you as you go through these challenges. Try different techniques to manage stress, handle social situations, and prevent weight gain. You can deal with cravings by keeping your mouth busy (such as by chewing gum), keeping your hands and body busy, calling family or friends, or contacting a quitline for people who want to quit smoking. You can deal with withdrawal symptoms by avoiding places where people smoke, getting plenty of rest, and  avoiding drinks with caffeine. This information is not intended to replace advice given to you by your health care provider. Make sure you discuss any questions you have with your healthcare provider. Document Revised: 07/17/2019 Document Reviewed: 07/17/2019 Elsevier Patient Education  2022 Elsevier Inc.   Diabetes Mellitus and Foot Care Foot care is an important part of your health, especially when you have diabetes. Diabetes may cause you to have problems because of poor blood flow (circulation) to your feet and legs, which can cause your skin to: Become thinner and drier. Break more easily. Heal more slowly. Peel and crack. You may also have nerve damage (neuropathy) in your legs and feet, causing decreased feeling in them. This means that you may not notice minor injuries to your feet that could lead to more serious problems. Noticing and addressing any potential problems early is the best wayto prevent future foot problems. How to care for your feet Foot hygiene  Wash your feet daily with warm water and mild soap. Do not use hot water. Then, pat your feet and the areas between your toes until they are completely dry. Do not soak your feet as this can dry your skin. Trim your toenails straight across. Do not dig under them or around the cuticle. File the edges of your nails with an emery board or nail file. Apply a moisturizing lotion or petroleum jelly to the skin on your feet and to dry, brittle  toenails. Use lotion that does not contain alcohol and is unscented. Do not apply lotion between your toes.  Shoes and socks Wear clean socks or stockings every day. Make sure they are not too tight. Do not wear knee-high stockings since they may decrease blood flow to your legs. Wear shoes that fit properly and have enough cushioning. Always look in your shoes before you put them on to be sure there are no objects inside. To break in new shoes, wear them for just a few hours a day. This  prevents injuries on your feet. Wounds, scrapes, corns, and calluses  Check your feet daily for blisters, cuts, bruises, sores, and redness. If you cannot see the bottom of your feet, use a mirror or ask someone for help. Do not cut corns or calluses or try to remove them with medicine. If you find a minor scrape, cut, or break in the skin on your feet, keep it and the skin around it clean and dry. You may clean these areas with mild soap and water. Do not clean the area with peroxide, alcohol, or iodine. If you have a wound, scrape, corn, or callus on your foot, look at it several times a day to make sure it is healing and not infected. Check for: Redness, swelling, or pain. Fluid or blood. Warmth. Pus or a bad smell.  General tips Do not cross your legs. This may decrease blood flow to your feet. Do not use heating pads or hot water bottles on your feet. They may burn your skin. If you have lost feeling in your feet or legs, you may not know this is happening until it is too late. Protect your feet from hot and cold by wearing shoes, such as at the beach or on hot pavement. Schedule a complete foot exam at least once a year (annually) or more often if you have foot problems. Report any cuts, sores, or bruises to your health care provider immediately. Where to find more information American Diabetes Association: www.diabetes.org Association of Diabetes Care & Education Specialists: www.diabeteseducator.org Contact a health care provider if: You have a medical condition that increases your risk of infection and you have any cuts, sores, or bruises on your feet. You have an injury that is not healing. You have redness on your legs or feet. You feel burning or tingling in your legs or feet. You have pain or cramps in your legs and feet. Your legs or feet are numb. Your feet always feel cold. You have pain around any toenails. Get help right away if: You have a wound, scrape, corn, or  callus on your foot and: You have pain, swelling, or redness that gets worse. You have fluid or blood coming from the wound, scrape, corn, or callus. Your wound, scrape, corn, or callus feels warm to the touch. You have pus or a bad smell coming from the wound, scrape, corn, or callus. You have a fever. You have a red line going up your leg. Summary Check your feet every day for blisters, cuts, bruises, sores, and redness. Apply a moisturizing lotion or petroleum jelly to the skin on your feet and to dry, brittle toenails. Wear shoes that fit properly and have enough cushioning. If you have foot problems, report any cuts, sores, or bruises to your health care provider immediately. Schedule a complete foot exam at least once a year (annually) or more often if you have foot problems. This information is not intended to replace advice given  to you by your health care provider. Make sure you discuss any questions you have with your healthcare provider. Document Revised: 04/17/2020 Document Reviewed: 04/17/2020 Elsevier Patient Education  2022 [employer].

## 2021-04-23 ENCOUNTER — Other Ambulatory Visit (HOSPITAL_COMMUNITY): Payer: Self-pay

## 2021-05-14 ENCOUNTER — Other Ambulatory Visit (HOSPITAL_COMMUNITY): Payer: Self-pay

## 2021-05-20 ENCOUNTER — Other Ambulatory Visit: Payer: Self-pay | Admitting: Physician Assistant

## 2021-05-20 ENCOUNTER — Other Ambulatory Visit (HOSPITAL_COMMUNITY): Payer: Self-pay

## 2021-05-20 DIAGNOSIS — E1165 Type 2 diabetes mellitus with hyperglycemia: Secondary | ICD-10-CM

## 2021-05-20 DIAGNOSIS — Z794 Long term (current) use of insulin: Secondary | ICD-10-CM

## 2021-05-20 MED ORDER — OZEMPIC (1 MG/DOSE) 4 MG/3ML ~~LOC~~ SOPN
1.0000 mg | PEN_INJECTOR | SUBCUTANEOUS | 1 refills | Status: DC
Start: 2021-05-20 — End: 2021-10-23
  Filled 2021-05-20: qty 3, 28d supply, fill #0
  Filled 2021-06-24: qty 3, 28d supply, fill #1
  Filled 2021-06-24: qty 9, 84d supply, fill #1
  Filled 2021-08-06: qty 3, 28d supply, fill #2
  Filled 2021-09-10: qty 3, 28d supply, fill #3

## 2021-05-21 ENCOUNTER — Other Ambulatory Visit (HOSPITAL_COMMUNITY): Payer: Self-pay

## 2021-05-22 ENCOUNTER — Other Ambulatory Visit (HOSPITAL_COMMUNITY): Payer: Self-pay

## 2021-06-08 ENCOUNTER — Other Ambulatory Visit (HOSPITAL_COMMUNITY): Payer: Self-pay

## 2021-06-08 ENCOUNTER — Telehealth: Payer: Self-pay | Admitting: Physician Assistant

## 2021-06-08 DIAGNOSIS — H6690 Otitis media, unspecified, unspecified ear: Secondary | ICD-10-CM

## 2021-06-08 MED ORDER — AMOXICILLIN-POT CLAVULANATE 875-125 MG PO TABS
1.0000 | ORAL_TABLET | Freq: Two times a day (BID) | ORAL | 0 refills | Status: DC
  Filled 2021-06-08: qty 14, 7d supply, fill #0

## 2021-06-08 MED ORDER — OFLOXACIN 0.3 % OT SOLN
10.0000 [drp] | Freq: Every day | OTIC | 0 refills | Status: DC
Start: 2021-06-08 — End: 2021-08-25
  Filled 2021-06-08: qty 10, 10d supply, fill #0

## 2021-06-08 NOTE — Addendum Note (Signed)
Addended by: Sylvester Harder on: 06/08/2021 10:29 AM   Modules accepted: Orders

## 2021-06-08 NOTE — Telephone Encounter (Signed)
Per Kandis Cocking sending Augmentin and Ofloxacin to patients pharmacy. Patient is aware and verbalized understanding. AS, CMA

## 2021-06-08 NOTE — Telephone Encounter (Signed)
Patient flipped out of an inner tube and now has swimmer's ear she states. Please advise, thanks.

## 2021-06-24 ENCOUNTER — Other Ambulatory Visit (HOSPITAL_COMMUNITY): Payer: Self-pay

## 2021-06-24 ENCOUNTER — Other Ambulatory Visit: Payer: Self-pay | Admitting: Physician Assistant

## 2021-06-24 DIAGNOSIS — Z794 Long term (current) use of insulin: Secondary | ICD-10-CM

## 2021-06-24 DIAGNOSIS — E1165 Type 2 diabetes mellitus with hyperglycemia: Secondary | ICD-10-CM

## 2021-06-24 MED ORDER — INSULIN GLARGINE-YFGN 100 UNIT/ML ~~LOC~~ SOPN
100.0000 [IU] | PEN_INJECTOR | Freq: Every day | SUBCUTANEOUS | 5 refills | Status: DC
  Filled 2021-06-24: qty 15, 15d supply, fill #0
  Filled 2021-08-06: qty 90, 90d supply, fill #0

## 2021-06-24 MED ORDER — METFORMIN HCL 1000 MG PO TABS
1000.0000 mg | ORAL_TABLET | Freq: Two times a day (BID) | ORAL | 1 refills | Status: DC
  Filled 2021-06-24: qty 180, 90d supply, fill #0
  Filled 2021-10-04: qty 180, 90d supply, fill #1

## 2021-06-30 ENCOUNTER — Other Ambulatory Visit (HOSPITAL_COMMUNITY): Payer: Self-pay

## 2021-06-30 MED ORDER — CYCLOBENZAPRINE HCL 10 MG PO TABS
10.0000 mg | ORAL_TABLET | Freq: Every evening | ORAL | 0 refills | Status: DC | PRN
  Filled 2021-06-30: qty 30, 30d supply, fill #0

## 2021-07-23 ENCOUNTER — Telehealth: Payer: Self-pay | Admitting: Neurology

## 2021-07-23 NOTE — Telephone Encounter (Signed)
Clarification: visit needs to be between 08/22/21-10/20/20

## 2021-07-23 NOTE — Telephone Encounter (Signed)
Pt scheduled for Initial CPAP viist on 10/15/21 Pt informed to bring machine and power cord to appt DME: [facility] Phone: [phone] Fax: [phone] Equiptment issued: AirSense 11 Pt to be scheduled between:08/12/21-10/20/20

## 2021-08-06 ENCOUNTER — Other Ambulatory Visit (HOSPITAL_COMMUNITY): Payer: Self-pay

## 2021-08-25 ENCOUNTER — Other Ambulatory Visit: Payer: Self-pay

## 2021-08-25 ENCOUNTER — Other Ambulatory Visit (HOSPITAL_COMMUNITY): Payer: Self-pay

## 2021-08-25 ENCOUNTER — Encounter: Payer: Self-pay | Admitting: Physician Assistant

## 2021-08-25 ENCOUNTER — Ambulatory Visit (INDEPENDENT_AMBULATORY_CARE_PROVIDER_SITE_OTHER): Payer: No Typology Code available for payment source | Admitting: Physician Assistant

## 2021-08-25 VITALS — BP 138/77 | HR 76 | Temp 98.3°F | Ht 65.0 in | Wt 260.0 lb

## 2021-08-25 DIAGNOSIS — E1165 Type 2 diabetes mellitus with hyperglycemia: Secondary | ICD-10-CM | POA: Diagnosis not present

## 2021-08-25 DIAGNOSIS — E1159 Type 2 diabetes mellitus with other circulatory complications: Secondary | ICD-10-CM | POA: Diagnosis not present

## 2021-08-25 DIAGNOSIS — Z716 Tobacco abuse counseling: Secondary | ICD-10-CM | POA: Diagnosis not present

## 2021-08-25 DIAGNOSIS — E1169 Type 2 diabetes mellitus with other specified complication: Secondary | ICD-10-CM | POA: Diagnosis not present

## 2021-08-25 DIAGNOSIS — I152 Hypertension secondary to endocrine disorders: Secondary | ICD-10-CM

## 2021-08-25 DIAGNOSIS — Z794 Long term (current) use of insulin: Secondary | ICD-10-CM | POA: Diagnosis not present

## 2021-08-25 DIAGNOSIS — Z720 Tobacco use: Secondary | ICD-10-CM

## 2021-08-25 DIAGNOSIS — E785 Hyperlipidemia, unspecified: Secondary | ICD-10-CM

## 2021-08-25 DIAGNOSIS — E559 Vitamin D deficiency, unspecified: Secondary | ICD-10-CM

## 2021-08-25 LAB — POCT GLYCOSYLATED HEMOGLOBIN (HGB A1C): Hemoglobin A1C: 6.9 % — AB (ref 4.0–5.6)

## 2021-08-25 MED ORDER — BUPROPION HCL ER (SR) 100 MG PO TB12
100.0000 mg | ORAL_TABLET | Freq: Two times a day (BID) | ORAL | 0 refills | Status: DC
  Filled 2021-08-25: qty 180, 90d supply, fill #0

## 2021-08-25 NOTE — Assessment & Plan Note (Signed)
-  Last lipid panel: total cholesterol 208, triglycerides 566, HDL 26, LDL 89 -Will repeat lipid panel and hepatic function. Continue atorvastatin 10 mg and Lovaza 4 g. If lipid panel remains elevated will consider increasing atorvastatin to 20 mg. -Recommend to follow low fat diet and monitor/reduce simple carbohydrates. -Will continue to monitor.

## 2021-08-25 NOTE — Assessment & Plan Note (Signed)
-  A1c 6.9, stable and at goal <7.0. Patient denies recent hypoglycemic events so will continue current medication regimen. Discussed decreasing insulin glargine to 95 units if were to have recurrent or frequent hypoglycemia, patient verbalized understanding.  -Will continue to monitor.

## 2021-08-25 NOTE — Patient Instructions (Signed)

## 2021-08-25 NOTE — Assessment & Plan Note (Signed)
-  Stable. -Continue current medication regimen. -Will collect CMP for medication monitoring. -Will continue to monitor. 

## 2021-08-25 NOTE — Progress Notes (Signed)
Established Patient Office Visit  Subjective:  Patient ID: Renee Ho, female    DOB: 1976/06/18  Age: 45 y.o. MRN: [id number]  CC:  Chief Complaint  Patient presents with   Follow-up   Diabetes   Hypertension   Hyperlipidemia    HPI Renee Ho presents for follow up on diabetes mellitus, hypertension and hyperlipidemia. Patient has no acute concerns.  Diabetes: Pt denies increased urination or thirst. Pt reports medication compliance. Two hypoglycemic events- 51 and 60s, which occurred after increasing to 1 mg of Ozempic and has not had a low blood sugar the past 2 months. States nausea has improved. Checking glucose at home. FBS average 140s.  HTN: Pt denies chest pain, palpitations, dizziness, orthopnea, or lower extremity swelling. Taking medication as directed without side effects.   HLD: Pt taking medication as directed without issues. Reports with Ozempic has learned what foods she is able to eat and limiting fats.   Tobacco use: Currently smoking 1 PPD. Reports was unable to maintain tobacco cessation because of worsening anxiety. In the past has tried nicotine therapy which was not helpful. Has not tried Wellbutrin or Varenicline.   Mood: Reports mood has been stable. Denies severe anxiety, panic attack or SI/HI.  Past Medical History:  Diagnosis Date   Depression    Diabetes mellitus    Diabetes mellitus without complication ([place])    Diverticula of intestine    High cholesterol    Hypertension    OSA on CPAP    CPAP pressure= 15   Polycystic ovarian syndrome    Sleep apnea    on CPAP with pressure setting= 15    Past Surgical History:  Procedure Laterality Date   ABDOMINAL HYSTERECTOMY     CYST REMOVAL NECK     KNEE ARTHROSCOPY  07/11/2012   Procedure: ARTHROSCOPY KNEE;  Surgeon: Meredith Pel, MD;  Location: [facility];  Service: Orthopedics;  Laterality: Right;  Right knee arthroscopy with debridement   KNEE SURGERY      OVARIAN CYST REMOVAL     WISDOM TOOTH EXTRACTION  05/11/2017    Family History  Problem Relation Age of Onset   Cancer Mother        breast   Mental illness Mother    Depression Mother    Hyperlipidemia Mother    Breast cancer Mother    Healthy Father    Hyperlipidemia Father    Cancer Maternal Grandfather        unknown type   Depression Maternal Grandfather    Hyperlipidemia Maternal Grandfather    Stroke Paternal Grandmother    Hyperlipidemia Paternal Grandmother    Diabetes Maternal Grandmother    Hyperlipidemia Maternal Grandmother    Hyperlipidemia Paternal Grandfather     Social History   Socioeconomic History   Marital status: Married    Spouse name: Not on file   Number of children: 1   Years of education: College   Highest education level: Not on file  Occupational History   Not on file  Tobacco Use   Smoking status: Every Day    Packs/day: 1.00    Years: 20.00    Pack years: 20.00    Types: Cigarettes   Smokeless tobacco: Never   Tobacco comments:    stopped smoking on 02/17/17  Substance and Sexual Activity   Alcohol use: No    Alcohol/week: 0.0 standard drinks    Comment: Rarely.   Drug use: No   Sexual activity: Yes  Birth control/protection: Surgical  Other Topics Concern   Not on file  Social History Narrative   ** Merged History Encounter **   Drinks diet Dr. Malachi Bonds about 1 a day.        Social Determinants of Health   Financial Resource Strain: Not on file  Food Insecurity: Not on file  Transportation Needs: Not on file  Physical Activity: Not on file  Stress: Not on file  Social Connections: Not on file  Intimate Partner Violence: Not on file    Outpatient Medications Prior to Visit  Medication Sig Dispense Refill   atorvastatin (LIPITOR) 10 MG tablet Take 1 tablet (10 mg total) by mouth daily. 90 tablet 1   blood glucose meter kit and supplies KIT Dispense based on patient and insurance preference. Use up to four times daily  as directed. (FOR ICD-9 250.00, 250.01). 1 each 0   dapagliflozin propanediol (FARXIGA) 10 MG TABS tablet Take 1 tablet (10 mg total) by mouth daily. 90 tablet 1   DULoxetine (CYMBALTA) 30 MG capsule Take 1 capsule (30 mg total) by mouth daily. 90 capsule 1   DULoxetine (CYMBALTA) 60 MG capsule Take 1 capsule (60 mg total) by mouth daily. 90 capsule 1   glucose blood (FREESTYLE LITE) test strip USE AS DIRECTED THREE TO FOUR TIMES DAILY 100 each 1   hydrochlorothiazide (HYDRODIURIL) 25 MG tablet Take 1 tablet (25 mg total) by mouth daily. 90 tablet 1   ibuprofen (ADVIL) 200 MG tablet Take 800 mg by mouth daily as needed for headache or moderate pain.     insulin glargine-yfgn (SEMGLEE, YFGN,) 100 UNIT/ML Pen Inject 100 Units into the skin at bedtime. 15 mL 5   Insulin Pen Needle 31G X 8 MM MISC Use as directed (Patient taking differently: Use as directed) 100 each 3   Insulin Syringe-Needle U-100 (TRUEPLUS INSULIN SYRINGE) 30G X 5/16" 0.5 ML MISC USE AS DIRECTED EVERY DAY with insulin pen 100 each 3   lisinopril (ZESTRIL) 5 MG tablet Take 1 tablet (5 mg total) by mouth daily. 90 tablet 1   metFORMIN (GLUCOPHAGE) 1000 MG tablet Take 1 tablet (1,000 mg total) by mouth 2 (two) times daily with meals 180 tablet 1   omega-3 acid ethyl esters (LOVAZA) 1 g capsule TAKE 2 CAPSULES BY MOUTH TWICE A DAY. 120 capsule 1   Semaglutide, 1 MG/DOSE, (OZEMPIC, 1 MG/DOSE,) 4 MG/3ML SOPN Inject 1 mg into the skin once a week. 6 mL 1   amoxicillin-clavulanate (AUGMENTIN) 875-125 MG tablet Take 1 tablet by mouth 2 (two) times daily. 14 tablet 0   cyclobenzaprine (FLEXERIL) 10 MG tablet Take 1 tablet (10 mg total) by mouth at bedtime as needed for muscle spasms 30 tablet 0   methylPREDNISolone (MEDROL DOSEPAK) 4 MG TBPK tablet Takes as directed on package. 21 tablet 0   ofloxacin (FLOXIN OTIC) 0.3 % OTIC solution Place 10 drops into both ears daily for 7 days 10 mL 0   No facility-administered medications prior to  visit.    Allergies  Allergen Reactions   Morphine And Related Other (See Comments)    Migraines   Reglan [Metoclopramide] Other (See Comments)    Psychotic episodes   Toradol [Ketorolac Tromethamine] Rash    ROS Review of Systems Review of Systems:  A fourteen system review of systems was performed and found to be positive as per HPI.   Objective:    Physical Exam General:  Pleasant and cooperative, in no acute distress Neuro:  Alert and oriented,  extra-ocular muscles intact  HEENT:  Normocephalic, atraumatic, neck supple Skin:  no gross rash, warm, pink. Cardiac:  RRR Respiratory:  CTA B/L w/o wheezing, Not using accessory muscles, speaking in full sentences- unlabored. Vascular:  Ext warm, no cyanosis apprec.; cap RF less 2 sec. No gross edema  Psych:  No HI/SI, judgement and insight good, Euthymic mood. Full Affect.  BP 138/77   Pulse 76   Temp 98.3 F (36.8 C)   Ht '5\' 5"'  (1.651 m)   Wt 260 lb (117.9 kg)   SpO2 98%   BMI 43.27 kg/m  Wt Readings from Last 3 Encounters:  08/25/21 260 lb (117.9 kg)  04/21/21 263 lb 12.8 oz (119.7 kg)  03/03/21 262 lb 6.4 oz (119 kg)     Health Maintenance Due  Topic Date Due   Pneumococcal Vaccine 60-6 Years old (2 - PCV) 07/18/2014   COLONOSCOPY (Pts 45-70yr Insurance coverage will need to be confirmed)  Never done   OPHTHALMOLOGY EXAM  08/31/2019   COVID-19 Vaccine (3 - Booster for Pfizer series) 08/29/2020   INFLUENZA VACCINE  05/11/2021    There are no preventive care reminders to display for this patient.  Lab Results  Component Value Date   TSH 1.940 01/12/2021   Lab Results  Component Value Date   WBC 12.3 (H) 03/03/2021   HGB 18.7 (H) 03/03/2021   HCT 54.7 (H) 03/03/2021   MCV 91 03/03/2021   PLT 216 03/03/2021   Lab Results  Component Value Date   NA 137 03/03/2021   K 4.6 03/03/2021   CO2 20 03/03/2021   GLUCOSE 161 (H) 03/03/2021   BUN 19 03/03/2021   CREATININE 0.81 03/03/2021   BILITOT 0.6  03/03/2021   ALKPHOS 66 03/03/2021   AST 13 03/03/2021   ALT 11 03/03/2021   PROT 7.7 03/03/2021   ALBUMIN 4.8 03/03/2021   CALCIUM 9.7 03/03/2021   ANIONGAP 10 12/22/2016   EGFR 90 03/03/2021   Lab Results  Component Value Date   CHOL 208 (H) 01/12/2021   Lab Results  Component Value Date   HDL 26 (L) 01/12/2021   Lab Results  Component Value Date   LDLCALC 89 01/12/2021   Lab Results  Component Value Date   TRIG 566 ([place] 01/12/2021   Lab Results  Component Value Date   CHOLHDL 8.0 (H) 01/12/2021   Lab Results  Component Value Date   HGBA1C 6.9 (A) 08/25/2021      Assessment & Plan:   Problem List Items Addressed This Visit       Cardiovascular and Mediastinum   Hypertension associated with diabetes ([place]    -Stable. -Continue current medication regimen. -Will collect CMP for medication monitoring. -Will continue to monitor.      Relevant Orders   Comp Met (CMET)   CBC w/Diff     Endocrine   Type 2 diabetes mellitus with hyperglycemia, with long-term current use of insulin (HCC) - Primary    -A1c 6.9, stable and at goal <7.0. Patient denies recent hypoglycemic events so will continue current medication regimen. Discussed decreasing insulin glargine to 95 units if were to have recurrent or frequent hypoglycemia, patient verbalized understanding.  -Will continue to monitor.      Relevant Orders   POCT glycosylated hemoglobin (Hb A1C) (Completed)   Comp Met (CMET)   CBC w/Diff   Hyperlipidemia associated with type 2 diabetes mellitus ([place]    -Last lipid panel: total cholesterol 208, triglycerides 566,  HDL 26, LDL 89 -Will repeat lipid panel and hepatic function. Continue atorvastatin 10 mg and Lovaza 4 g. If lipid panel remains elevated will consider increasing atorvastatin to 20 mg. -Recommend to follow low fat diet and monitor/reduce simple carbohydrates. -Will continue to monitor.      Relevant Orders   Comp Met (CMET)   CBC w/Diff   Lipid  Profile   Other Visit Diagnoses     Encounter for tobacco use cessation counseling       Vitamin D deficiency       Relevant Orders   Vitamin D (25 hydroxy)   Tobacco user       Relevant Medications   buPROPion ER (WELLBUTRIN SR) 100 MG 12 hr tablet      Encounter for tobacco use cessation counseling: -The patient was counseled on the dangers of tobacco use, and was advised to quit.  Reviewed strategies to maximize success, including removing cigarettes and smoking materials from environment, stress management, substitution of other forms of reinforcement, and pharmacotherapy (Wellbutrin SR). Patient is agreeable to starting Wellbutrin. Discussed potential side effects and recommend to let me know if unable to tolerate medication. Advised can take Wellbutrin SR 100 mg once daily x 1 week and then take medication twice daily.  Vitamin D deficiency: -Last Vitamin D 11.7 (01/12/2021) and patient was advised to start Vitamin D 50,000 units once weekly, will repeat Vitamin D today.   Meds ordered this encounter  Medications   buPROPion ER (WELLBUTRIN SR) 100 MG 12 hr tablet    Sig: Take 1 tablet (100 mg total) by mouth 2 (two) times daily.    Dispense:  180 tablet    Refill:  0    Order Specific Question:   Supervising Provider    Answer:   Beatrice Lecher D [[id number]]     Follow-up: Return in about 3 months (around 11/25/2021) for CPE .    [name], PA-C

## 2021-08-26 ENCOUNTER — Other Ambulatory Visit: Payer: Self-pay

## 2021-08-26 ENCOUNTER — Other Ambulatory Visit (HOSPITAL_COMMUNITY): Payer: Self-pay

## 2021-08-26 DIAGNOSIS — E559 Vitamin D deficiency, unspecified: Secondary | ICD-10-CM

## 2021-08-26 LAB — CBC WITH DIFFERENTIAL/PLATELET
Basophils Absolute: 0.1 10*3/uL (ref 0.0–0.2)
Basos: 1 %
EOS (ABSOLUTE): 0.2 10*3/uL (ref 0.0–0.4)
Eos: 2 %
Hematocrit: 52.8 % — ABNORMAL HIGH (ref 34.0–46.6)
Hemoglobin: 17.7 g/dL — ABNORMAL HIGH (ref 11.1–15.9)
Immature Grans (Abs): 0.2 10*3/uL — ABNORMAL HIGH (ref 0.0–0.1)
Immature Granulocytes: 2 %
Lymphocytes Absolute: 2.8 10*3/uL (ref 0.7–3.1)
Lymphs: 25 %
MCH: 30.3 pg (ref 26.6–33.0)
MCHC: 33.5 g/dL (ref 31.5–35.7)
MCV: 90 fL (ref 79–97)
Monocytes Absolute: 0.7 10*3/uL (ref 0.1–0.9)
Monocytes: 6 %
Neutrophils Absolute: 6.9 10*3/uL (ref 1.4–7.0)
Neutrophils: 64 %
Platelets: 209 10*3/uL (ref 150–450)
RBC: 5.85 x10E6/uL — ABNORMAL HIGH (ref 3.77–5.28)
RDW: 13.1 % (ref 11.7–15.4)
WBC: 10.9 10*3/uL — ABNORMAL HIGH (ref 3.4–10.8)

## 2021-08-26 LAB — COMPREHENSIVE METABOLIC PANEL
ALT: 5 IU/L (ref 0–32)
AST: 13 IU/L (ref 0–40)
Albumin/Globulin Ratio: 1.4 (ref 1.2–2.2)
Albumin: 4.4 g/dL (ref 3.8–4.8)
Alkaline Phosphatase: 68 IU/L (ref 44–121)
BUN/Creatinine Ratio: 21 (ref 9–23)
BUN: 16 mg/dL (ref 6–24)
Bilirubin Total: 0.3 mg/dL (ref 0.0–1.2)
CO2: 23 mmol/L (ref 20–29)
Calcium: 10 mg/dL (ref 8.7–10.2)
Chloride: 100 mmol/L (ref 96–106)
Creatinine, Ser: 0.76 mg/dL (ref 0.57–1.00)
Globulin, Total: 3.1 g/dL (ref 1.5–4.5)
Glucose: 104 mg/dL — ABNORMAL HIGH (ref 70–99)
Potassium: 4.5 mmol/L (ref 3.5–5.2)
Sodium: 141 mmol/L (ref 134–144)
Total Protein: 7.5 g/dL (ref 6.0–8.5)
eGFR: 97 mL/min/{1.73_m2} (ref >59)

## 2021-08-26 LAB — VITAMIN D 25 HYDROXY (VIT D DEFICIENCY, FRACTURES): Vit D, 25-Hydroxy: 11.8 ng/mL — ABNORMAL LOW (ref 30.0–100.0)

## 2021-08-26 LAB — LIPID PANEL
Chol/HDL Ratio: 5.6 ratio — ABNORMAL HIGH (ref 0.0–4.4)
Cholesterol, Total: 167 mg/dL (ref 100–199)
HDL: 30 mg/dL — ABNORMAL LOW (ref >39)
LDL Chol Calc (NIH): 78 mg/dL (ref 0–99)
Triglycerides: 368 mg/dL — ABNORMAL HIGH (ref 0–149)
VLDL Cholesterol Cal: 59 mg/dL — ABNORMAL HIGH (ref 5–40)

## 2021-08-26 MED ORDER — VITAMIN D (ERGOCALCIFEROL) 1.25 MG (50000 UNIT) PO CAPS
50000.0000 [IU] | ORAL_CAPSULE | ORAL | 1 refills | Status: DC
  Filled 2021-08-26: qty 12, 84d supply, fill #0

## 2021-08-27 ENCOUNTER — Other Ambulatory Visit (HOSPITAL_COMMUNITY): Payer: Self-pay

## 2021-09-10 ENCOUNTER — Other Ambulatory Visit (HOSPITAL_COMMUNITY): Payer: Self-pay

## 2021-10-04 ENCOUNTER — Other Ambulatory Visit: Payer: Self-pay | Admitting: Physician Assistant

## 2021-10-04 DIAGNOSIS — E1165 Type 2 diabetes mellitus with hyperglycemia: Secondary | ICD-10-CM

## 2021-10-06 ENCOUNTER — Other Ambulatory Visit (HOSPITAL_COMMUNITY): Payer: Self-pay

## 2021-10-06 MED ORDER — INSULIN GLARGINE-YFGN 100 UNIT/ML ~~LOC~~ SOPN
100.0000 [IU] | PEN_INJECTOR | Freq: Every day | SUBCUTANEOUS | 5 refills | Status: DC
  Filled 2021-10-06: qty 15, 15d supply, fill #0
  Filled 2021-11-27: qty 45, 45d supply, fill #0
  Filled 2021-12-01: qty 90, 90d supply, fill #0

## 2021-10-13 ENCOUNTER — Other Ambulatory Visit (HOSPITAL_COMMUNITY): Payer: Self-pay

## 2021-10-13 ENCOUNTER — Encounter: Payer: Self-pay | Admitting: Physician Assistant

## 2021-10-13 ENCOUNTER — Ambulatory Visit (INDEPENDENT_AMBULATORY_CARE_PROVIDER_SITE_OTHER): Payer: No Typology Code available for payment source | Admitting: Physician Assistant

## 2021-10-13 VITALS — Ht 65.0 in | Wt 260.0 lb

## 2021-10-13 DIAGNOSIS — J019 Acute sinusitis, unspecified: Secondary | ICD-10-CM

## 2021-10-13 DIAGNOSIS — R051 Acute cough: Secondary | ICD-10-CM

## 2021-10-13 MED ORDER — BENZONATATE 100 MG PO CAPS
100.0000 mg | ORAL_CAPSULE | Freq: Three times a day (TID) | ORAL | 0 refills | Status: DC | PRN
  Filled 2021-10-13: qty 20, 4d supply, fill #0

## 2021-10-13 MED ORDER — AZITHROMYCIN 250 MG PO TABS
ORAL_TABLET | ORAL | 0 refills | Status: AC
  Filled 2021-10-13: qty 6, 5d supply, fill #0

## 2021-10-13 NOTE — Progress Notes (Signed)
Telehealth office visit note for Renee Reid, PA-C- at Primary Care at [facility]   I connected with current patient today by telephone and verified that I am speaking with the correct person    Location of the patient: Home  Location of the provider: Office - This visit type was conducted due to national recommendations for restrictions regarding the COVID-19 Pandemic (e.g. social distancing) in an effort to limit this patient's exposure and mitigate transmission in our community.    - No physical exam could be performed with this format, beyond that communicated to [place] by the patient/ family members as noted.   - Additionally my office staff/ schedulers were to discuss with the patient that there may be a monetary charge related to this service, depending on their medical insurance.  My understanding is that patient understood and consented to proceed.     _________________________________________________________________________________   History of Present Illness: Patient calls in with c/o productive cough with brown-green sputum, sore throat, unilateral sinus pressure, runny nose, nasal congestion and some nausea x 3 days. Patient took a Covid-19 test today which resulted negative. Denies fever, chills, vomiting, diarrhea, wheezing, chest pain, palpitations or shortness of breath. Patient has tried Tylenol cold/flu, Dayquil and Nyquil and cough lozenges with minimal relief. Symptoms have gradually progressed and reports had to call out of work today.      GAD 7 : Generalized Anxiety Score 08/25/2021 04/21/2021 03/03/2021  Nervous, Anxious, on Edge '1 1 1  ' Control/stop worrying 0 1 1  Worry too much - different things 0 1 1  Trouble relaxing 0 1 1  Restless 0 1 0  Easily annoyed or irritable 0 1 1  Afraid - awful might happen 0 1 0  Total GAD 7 Score '1 7 5  ' Anxiety Difficulty Not difficult at all Somewhat difficult Not difficult at all    Depression screen [facility] 2/9  08/25/2021 04/21/2021 03/03/2021 01/12/2021 10/14/2020  Decreased Interest 0 1 1 0 2  Down, Depressed, Hopeless 0 1 1 0 2  PHQ - 2 Score 0 2 2 0 4  Altered sleeping '1 1 3 3 3  ' Tired, decreased energy '1 1 2 3 3  ' Change in appetite 0 '1 1 3 1  ' Feeling bad or failure about yourself  0 0 0 0 1  Trouble concentrating 0 '1 1 1 1  ' Moving slowly or fidgety/restless 0 0 0 1 1  Suicidal thoughts 0 0 0 0 0  PHQ-9 Score '2 6 9 11 14  ' Difficult doing work/chores Not difficult at all Somewhat difficult Not difficult at all - Very difficult  Some recent data might be hidden      Impression and Recommendations:     1. Acute non-recurrent sinusitis, unspecified location   2. Acute cough     Discussed with patient ruling out influenza infection and advised to come in for flu testing tomorrow morning, pt verbalized understanding and agreeable. In the interim, due to severity and worsening symptoms will start antibiotic therapy for sinusitis. Pending flu test, will adjust treatment plan if indicated. Take tessalon perles as needed for cough.  Recommend to continue home supportive care and monitor for worsening symptoms.    - As part of my medical decision making, I reviewed the following data within the [place] History obtained from pt /family, CMA notes reviewed and incorporated if applicable, Labs reviewed, Radiograph/ tests reviewed if applicable and OV notes from prior OV's  with me, as well as any other specialists she/he has seen since seeing me last, were all reviewed and used in my medical decision making process today.    - Additionally, when appropriate, discussion had with patient regarding our treatment plan, and their biases/concerns about that plan were used in my medical decision making today.    - The patient agreed with the plan and demonstrated an understanding of the instructions.   No barriers to understanding were identified.     - The patient was advised to call back or  seek an in-person evaluation if the symptoms worsen or if the condition fails to improve as anticipated.   Return if symptoms worsen or fail to improve.    No orders of the defined types were placed in this encounter.   Meds ordered this encounter  Medications   azithromycin (ZITHROMAX) 250 MG tablet    Sig: Take 2 tablets on day 1, then 1 tablet daily on days 2 through 5    Dispense:  6 tablet    Refill:  0    Order Specific Question:   Supervising Provider    Answer:   Hali Marry [[id number]]   benzonatate (TESSALON) 100 MG capsule    Sig: Take 1-2 capsules (100-200 mg total) by mouth 3 (three) times daily as needed for cough.    Dispense:  20 capsule    Refill:  0    Order Specific Question:   Supervising Provider    Answer:   Beatrice Lecher D [[id number]]    There are no discontinued medications.     Time spent on telephone encounter was 8 minutes.      The [place] was signed into law in 2016 which includes the topic of electronic health records.  This provides immediate access to information in MyChart.  This includes consultation notes, operative notes, office notes, lab results and pathology reports.  If you have any questions about what you read please let us know at your next visit or call us at the office.  We are right here with you.   __________________________________________________________________________________     Patient Care Team    Relationship Specialty Notifications Start End  Renee Ho, [place] PCP - General Physician Assistant  04/07/20   Clent Jacks, MD  Ophthalmology  12/31/11      -Vitals obtained; medications/ allergies reconciled;  personal medical, social, Sx etc.histories were updated by CMA, reviewed by me and are reflected in chart   Patient Active Problem List   Diagnosis Date Noted   Hyperlipidemia associated with type 2 diabetes mellitus ([place]) 04/14/2020   Lumbar radiculopathy 08/09/2019    Hyperglycemia 12/22/2016   Injury of foot, left 07/18/2013   Hypertension associated with diabetes ([place]) 09/18/2012   Morbid obesity ([place]) 09/18/2012   S/P hysterectomy 06/20/2012   H/O colonoscopy with polypectomy 06/20/2012   Depression 12/30/2011   Plantar fasciitis 04/22/2011   HYPERTRIGLYCERIDEMIA 02/21/2008   Type 2 diabetes mellitus with hyperglycemia, with long-term current use of insulin ([place]) 02/24/2007   POLYCYSTIC OVARIAN DISEASE 02/24/2007   HYPERTENSION 02/24/2007   Sleep apnea 02/24/2007     Current Meds  Medication Sig   atorvastatin (LIPITOR) 10 MG tablet Take 1 tablet (10 mg total) by mouth daily.   azithromycin (ZITHROMAX) 250 MG tablet Take 2 tablets on day 1, then 1 tablet daily on days 2 through 5   benzonatate (TESSALON) 100 MG capsule Take 1-2 capsules (100-200 mg total) by mouth 3 (three)  times daily as needed for cough.   blood glucose meter kit and supplies KIT Dispense based on patient and insurance preference. Use up to four times daily as directed. (FOR ICD-9 250.00, 250.01).   buPROPion ER (WELLBUTRIN SR) 100 MG 12 hr tablet Take 1 tablet (100 mg total) by mouth 2 (two) times daily.   dapagliflozin propanediol (FARXIGA) 10 MG TABS tablet Take 1 tablet (10 mg total) by mouth daily.   DULoxetine (CYMBALTA) 30 MG capsule Take 1 capsule (30 mg total) by mouth daily.   DULoxetine (CYMBALTA) 60 MG capsule Take 1 capsule (60 mg total) by mouth daily.   glucose blood (FREESTYLE LITE) test strip USE AS DIRECTED THREE TO FOUR TIMES DAILY   hydrochlorothiazide (HYDRODIURIL) 25 MG tablet Take 1 tablet (25 mg total) by mouth daily.   ibuprofen (ADVIL) 200 MG tablet Take 800 mg by mouth daily as needed for headache or moderate pain.   insulin glargine-yfgn (SEMGLEE, YFGN,) 100 UNIT/ML Pen Inject 100 Units into the skin at bedtime.   Insulin Pen Needle 31G X 8 MM MISC Use as directed (Patient taking differently: Use as directed)   Insulin Syringe-Needle U-100 (TRUEPLUS  INSULIN SYRINGE) 30G X 5/16" 0.5 ML MISC USE AS DIRECTED EVERY DAY with insulin pen   lisinopril (ZESTRIL) 5 MG tablet Take 1 tablet (5 mg total) by mouth daily.   metFORMIN (GLUCOPHAGE) 1000 MG tablet Take 1 tablet (1,000 mg total) by mouth 2 (two) times daily with meals   omega-3 acid ethyl esters (LOVAZA) 1 g capsule TAKE 2 CAPSULES BY MOUTH TWICE A DAY.   Semaglutide, 1 MG/DOSE, (OZEMPIC, 1 MG/DOSE,) 4 MG/3ML SOPN Inject 1 mg into the skin once a week.   Vitamin D, Ergocalciferol, (DRISDOL) 1.25 MG (50000 UNIT) CAPS capsule Take 1 capsule (50,000 Units total) by mouth every 7 (seven) days.     Allergies:  Allergies  Allergen Reactions   Morphine And Related Other (See Comments)    Migraines   Reglan [Metoclopramide] Other (See Comments)    Psychotic episodes   Toradol [Ketorolac Tromethamine] Rash     ROS:  See above HPI for pertinent positives and negatives   Objective:   Height '5\' 5"'  (1.651 m), weight 260 lb (117.9 kg).  (if some vitals are omitted, this means that patient was UNABLE to obtain them.) General: A & O * 3; sounds in no respiratory distress Respiratory: speaking in full sentences, no conversational dyspnea

## 2021-10-14 ENCOUNTER — Other Ambulatory Visit (HOSPITAL_COMMUNITY): Payer: Self-pay

## 2021-10-15 ENCOUNTER — Ambulatory Visit: Payer: No Typology Code available for payment source | Admitting: Neurology

## 2021-10-23 ENCOUNTER — Other Ambulatory Visit: Payer: Self-pay | Admitting: Physician Assistant

## 2021-10-23 ENCOUNTER — Other Ambulatory Visit (HOSPITAL_COMMUNITY): Payer: Self-pay

## 2021-10-23 DIAGNOSIS — E1165 Type 2 diabetes mellitus with hyperglycemia: Secondary | ICD-10-CM

## 2021-10-23 MED ORDER — OZEMPIC (1 MG/DOSE) 4 MG/3ML ~~LOC~~ SOPN
1.0000 mg | PEN_INJECTOR | SUBCUTANEOUS | 1 refills | Status: DC
  Filled 2021-10-23: qty 9, 84d supply, fill #0

## 2021-11-26 ENCOUNTER — Encounter: Payer: No Typology Code available for payment source | Admitting: Physician Assistant

## 2021-11-27 ENCOUNTER — Other Ambulatory Visit (HOSPITAL_COMMUNITY): Payer: Self-pay

## 2021-11-30 ENCOUNTER — Encounter: Payer: No Typology Code available for payment source | Admitting: Physician Assistant

## 2021-12-01 ENCOUNTER — Other Ambulatory Visit (HOSPITAL_COMMUNITY): Payer: Self-pay

## 2021-12-21 ENCOUNTER — Encounter: Payer: Self-pay | Admitting: *Deleted

## 2022-01-07 ENCOUNTER — Other Ambulatory Visit (HOSPITAL_COMMUNITY): Payer: Self-pay

## 2022-01-07 ENCOUNTER — Other Ambulatory Visit: Payer: Self-pay | Admitting: Physician Assistant

## 2022-01-07 DIAGNOSIS — E1165 Type 2 diabetes mellitus with hyperglycemia: Secondary | ICD-10-CM

## 2022-01-07 MED ORDER — DAPAGLIFLOZIN PROPANEDIOL 10 MG PO TABS
10.0000 mg | ORAL_TABLET | Freq: Every day | ORAL | 0 refills | Status: DC
  Filled 2022-01-07 – 2022-01-20 (×2): qty 90, 90d supply, fill #0

## 2022-01-15 ENCOUNTER — Other Ambulatory Visit (HOSPITAL_COMMUNITY): Payer: Self-pay

## 2022-01-20 ENCOUNTER — Other Ambulatory Visit (HOSPITAL_COMMUNITY): Payer: Self-pay

## 2022-01-20 ENCOUNTER — Other Ambulatory Visit: Payer: Self-pay | Admitting: Physician Assistant

## 2022-01-20 DIAGNOSIS — E1165 Type 2 diabetes mellitus with hyperglycemia: Secondary | ICD-10-CM

## 2022-01-20 MED ORDER — OZEMPIC (1 MG/DOSE) 4 MG/3ML ~~LOC~~ SOPN
1.0000 mg | PEN_INJECTOR | SUBCUTANEOUS | 1 refills | Status: DC
  Filled 2022-01-20: qty 9, 84d supply, fill #0

## 2022-01-29 ENCOUNTER — Other Ambulatory Visit (HOSPITAL_COMMUNITY): Payer: Self-pay

## 2022-01-29 ENCOUNTER — Other Ambulatory Visit: Payer: Self-pay | Admitting: Physician Assistant

## 2022-01-29 DIAGNOSIS — E1165 Type 2 diabetes mellitus with hyperglycemia: Secondary | ICD-10-CM

## 2022-01-29 MED ORDER — METFORMIN HCL 1000 MG PO TABS
1000.0000 mg | ORAL_TABLET | Freq: Two times a day (BID) | ORAL | 0 refills | Status: DC
  Filled 2022-01-29: qty 90, 45d supply, fill #0

## 2022-02-16 ENCOUNTER — Ambulatory Visit: Payer: No Typology Code available for payment source | Admitting: Neurology

## 2022-02-16 ENCOUNTER — Encounter: Payer: Self-pay | Admitting: Neurology

## 2022-02-16 VITALS — BP 132/81 | HR 87 | Ht 65.0 in | Wt 256.2 lb

## 2022-02-16 DIAGNOSIS — G4733 Obstructive sleep apnea (adult) (pediatric): Secondary | ICD-10-CM

## 2022-02-16 DIAGNOSIS — Z9989 Dependence on other enabling machines and devices: Secondary | ICD-10-CM

## 2022-02-16 NOTE — Patient Instructions (Signed)
It was nice to see you again today.  You are compliant with your new AutoPap machine and I am glad to hear that it is working out well for you.  Please continue to stay up-to-date with your supplies and follow-up yearly with [place], so long as you are stable.  We can renew your prescription for CPAP related supplies once a year or as needed.  Please continue to work on weight loss and smoking cessation, you have had steady success thus far.  I am glad to hear that your A1c is much improved.  Please follow-up to see one of our nurse practitioners in 1 year, as discussed, we can offer a virtual visit through MyChart as well. ? ?Please continue using your autoPAP regularly. While your insurance requires that you use PAP at least 4 hours each night on 70% of the nights, I recommend, that you not skip any nights and use it throughout the night if you can. Getting used to PAP and staying with the treatment long term does take time and patience and discipline. Untreated obstructive sleep apnea when it is moderate to severe can have an adverse impact on cardiovascular health and raise her risk for heart disease, arrhythmias, hypertension, congestive heart failure, stroke and diabetes. Untreated obstructive sleep apnea causes sleep disruption, nonrestorative sleep, and sleep deprivation. This can have an impact on your day to day functioning and cause daytime sleepiness and impairment of cognitive function, memory loss, mood disturbance, and problems focussing. Using PAP regularly can improve these symptoms. ? ? ? ?

## 2022-02-16 NOTE — Progress Notes (Signed)
Subjective:  ?  ?Patient ID: Renee Ho is a 46 y.o. female. ? ?HPI ? ? ? ?Interim history:  ? ?Renee Ho is a 46 year old right-handed woman with an underlying medical history of PCOS, hypertension, hyperlipidemia, diabetes, depression, and severe obesity with a BMI of over 40, who presents for follow-up consultation of her obstructive sleep testing and starting new equipment.  The patient is unaccompanied today.  I last saw her on 08/12/2020, at which time she was compliant with her old CPAP of 15 cm.  She was eligible for new equipment.  We mutually agreed to proceed with retesting.  She had an interim home sleep test on 11/17/2020 which indicated severe obstructive sleep apnea with an AHI of 54.1/h, O2 nadir 84%.  She was prescribed a new AutoPap machine.  Her set up date was 07/22/2021. She has a ResMed air sense 11 AutoSet machine.  She has not been seen in this clinic since November 2021. ? ?Today, 02/16/2022: I reviewed her AutoPap compliance data from 01/16/2022 through 02/14/2022, which is a total of 30 days used 2 machine every night with percent use days greater than 4 hours at 97%, indicating excellent compliance with an average usage of 9 hours and 20 minutes, residual AHI at goal at 4.1/h, 95th percentile of pressure at 14.5 cm with a range of 7 to 15 cm with EPR of 2.  Leak on the left side fairly consistently with the 95th percentile at 67 mL/min.  She reports well with her new AutoPap machine.  She likes the machine, she uses a fullface mask and does notice the leak because of frequent position changes, sometimes she even sleeps on her stomach.  She has been compliant with treatment, we talked about her study results as well as her compliance data.  She is working on smoking cessation.  She has been on Ozempic for nearly 1 year and has lost over 20 pounds, A1c has come down from 2 digits to less than 7. ? ?The patient's allergies, current medications, family history, past medical  history, past social history, past surgical history and problem list were reviewed and updated as appropriate.  ? ?Previously:  ? ?08/12/20: (She) was previously diagnosed with obstructive sleep apnea.  Sleep study testing originally was about 10 years ago.  I evaluated her about 5 years ago for her sleep apnea and she had a split-night sleep study through our sleep lab on 08/03/2015 which indicated severe obstructive sleep apnea with a baseline AHI of 44/h, O2 nadir of 81%.  Effective CPAP was 15 cm via medium full facemask. ?  ?She did not return for follow-up since then. She reports interim increase in fatigue and nonrestorative sleep. Her Epworth sleepiness score is 12 out of 24, fatigue severity score is 60 out of 63. Of note, she is on Cymbalta 90 mg daily.  She reports that she has been on the same dose of Cymbalta for about 19 years.  Several months ago, she went through a significant bout of depression and also had significant weight increase at the time to up to 296 pounds.  She had come off of her antidepressant medication and did not do well at all without it.  She reports that she currently would not be able to come off of her antidepressant medication.  I explained to her that if we wanted to exclude narcolepsy as an underlying possibility for her sleepiness then we would have to taper her psychotropic medication.  She is fully compliant  with her CPAP and also naps with it.  She takes a nap every day.  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  She goes to bed typically between 10 and 11 and rise time is between 6 and 7.  She does not have night to night nocturia but has had occasional morning headaches, more frequently in the recent past.  She is trying to lose weight.  She has been able to lose weight and she is also working on smoking cessation, has reduced from 2 packs/day to currently half a pack per day.  She does not drink alcohol.  She drinks caffeine in the form of Dr. Reino KentPepper diet soda, 4  cans/day on average.  She is married and lives with her husband.  She has a grown daughter age 48.  She would like to get a new CPAP machine.  She got a new mask about 2 months ago but needs a new prescription for supplies.  After she had sleep study testing through our lab in 2016 she had never actually did get a new machine, reports that she was not eligible at the time, per insurance. ?  ?I reviewed your office note from 07/08/2020.  ?  ?I reviewed her CPAP compliance data from 07/13/2020 through 08/11/2020, which is a total of 30 days, during which time she used her CPAP every night with percent use days greater than 4 hours at 100%, indicating superb compliance with an average usage of 9 hours and 51 minutes, pressure at 15 cm with EPR of 2, she has an S9 Elite CPAP, set up date 09/23/2014 from what we can see in the electronic records through the [facility] website.  Average AHI at goal at 1.2/h, leak very high with a 95th percentile at 49.8 L/min. ?She has benefited from CPAP therapy and is motivated to continue with treatment but needs new equipment and new supplies. ?  ?  ?06/24/15: 46 year old right-handed woman with an underlying medical history of hypertension, morbid obesity, diverticulitis, depression, plantar fasciitis, hyper lipidemia, type 2 diabetes, PCOS, and smoking, who was diagnosed with obstructive sleep apnea several years ago, probably over 6 years ago and has been on CPAP treatment. She reports compliance with treatment but she has not had a reevaluation in over 6 years and her machine is not working properly. Prior sleep test results were reviewed, today, where available. It looks like she had a CPAP titration study on 09/17/2010, interpreted by Dr. Jetty Duhamellinton Young: Sleep efficiency was 97.2%, REM latency 75.5 minutes, CPAP was titrated to 15 cm with AHI of 0 per hour. She had a large comfort gel fullface mask. Average oxygen saturation was 94.8%. She was apparently diagnosed with severe obstructive  sleep apnea in 2005. Her study results from 2011 mention a previous RDI from 08/31/2004 at 178.5 per hour and CPAP titration to 11 cm. She weighed 280 pounds at the time of her original diagnosis and her weight during the sleep study in 2011 was 278. I reviewed your office note from 06/18/2015.   ?She reports morning HAs for the past 2 years, about 2-3 times per week, dull, achy and bitemporal. She goes to bed around 10 PM and uses her CPAP regularly. She has not had a change in her mask in years and the machine is old intense to shut off in the early morning hours. She wakes up gasping for air and has residual snoring through the machine. She reports nocturia once on an average night. She has  some restless leg symptoms and feels and comfortable sensation in her legs but denies any numbness or tingling or history of diabetic neuropathy. She drinks alcohol very rarely, perhaps once a year and smokes half pack per day on average, she drinks caffeine occasionally, not daily. 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 Her rise time is usually 6 AM. She has excessive daytime somnolence and does not feel rested. Her Epworth sleepiness score is 17 out of 24, her fatigue score is 58 out of 63. There is no official diagnosis of sleep apnea in her family but her father snores and has breathing pauses but has not been tested for OSA. She lives with her fianc?[name] She has 1 child. She reports an improvement in her A1c to 7.1. He used to be around 10. ? ?Her Past Medical History Is Significant For: ?Past Medical History:  ?Diagnosis Date  ? Depression   ? Diabetes mellitus   ? Diabetes mellitus without complication (HCC)   ? Diverticula of intestine   ? High cholesterol   ? Hypertension   ? OSA on CPAP   ? CPAP pressure= 15  ? Polycystic ovarian syndrome   ? Sleep apnea   ? on CPAP with pressure setting= 15  ? ? ?Her Past Surgical History Is Significant For: ?Past Surgical History:  ?Procedure  Laterality Date  ? ABDOMINAL HYSTERECTOMY    ? CYST REMOVAL NECK    ? KNEE ARTHROSCOPY  07/11/2012  ? Procedure: ARTHROSCOPY KNEE;  Surgeon: Cammy Copa, MD;  Location: [facility] OR;  Service: Orthopedics;

## 2022-02-24 ENCOUNTER — Other Ambulatory Visit (HOSPITAL_COMMUNITY): Payer: Self-pay

## 2022-02-24 ENCOUNTER — Encounter: Payer: Self-pay | Admitting: Physician Assistant

## 2022-02-24 ENCOUNTER — Ambulatory Visit (INDEPENDENT_AMBULATORY_CARE_PROVIDER_SITE_OTHER): Payer: No Typology Code available for payment source | Admitting: Physician Assistant

## 2022-02-24 VITALS — BP 136/77 | HR 82 | Temp 97.7°F | Ht 65.0 in | Wt 255.1 lb

## 2022-02-24 DIAGNOSIS — F32A Depression, unspecified: Secondary | ICD-10-CM | POA: Diagnosis not present

## 2022-02-24 DIAGNOSIS — Z720 Tobacco use: Secondary | ICD-10-CM

## 2022-02-24 DIAGNOSIS — I152 Hypertension secondary to endocrine disorders: Secondary | ICD-10-CM

## 2022-02-24 DIAGNOSIS — E1165 Type 2 diabetes mellitus with hyperglycemia: Secondary | ICD-10-CM

## 2022-02-24 DIAGNOSIS — M62838 Other muscle spasm: Secondary | ICD-10-CM

## 2022-02-24 DIAGNOSIS — E559 Vitamin D deficiency, unspecified: Secondary | ICD-10-CM

## 2022-02-24 DIAGNOSIS — E1159 Type 2 diabetes mellitus with other circulatory complications: Secondary | ICD-10-CM

## 2022-02-24 DIAGNOSIS — E785 Hyperlipidemia, unspecified: Secondary | ICD-10-CM

## 2022-02-24 DIAGNOSIS — Z0000 Encounter for general adult medical examination without abnormal findings: Secondary | ICD-10-CM

## 2022-02-24 DIAGNOSIS — E1169 Type 2 diabetes mellitus with other specified complication: Secondary | ICD-10-CM

## 2022-02-24 DIAGNOSIS — Z794 Long term (current) use of insulin: Secondary | ICD-10-CM

## 2022-02-24 MED ORDER — INSULIN PEN NEEDLE 31G X 8 MM MISC
3 refills | Status: DC
  Filled 2022-02-24: qty 100, 90d supply, fill #0
  Filled 2022-09-27: qty 100, 90d supply, fill #1

## 2022-02-24 MED ORDER — LISINOPRIL 5 MG PO TABS
5.0000 mg | ORAL_TABLET | Freq: Every day | ORAL | 1 refills | Status: DC
  Filled 2022-02-24: qty 90, 90d supply, fill #0
  Filled 2022-05-10: qty 90, 90d supply, fill #1

## 2022-02-24 MED ORDER — VITAMIN D (ERGOCALCIFEROL) 1.25 MG (50000 UNIT) PO CAPS
50000.0000 [IU] | ORAL_CAPSULE | ORAL | 1 refills | Status: DC
  Filled 2022-02-24: qty 12, 84d supply, fill #0
  Filled 2022-05-10: qty 12, 84d supply, fill #1

## 2022-02-24 MED ORDER — VARENICLINE TARTRATE 1 MG PO TABS
1.0000 mg | ORAL_TABLET | Freq: Two times a day (BID) | ORAL | 2 refills | Status: DC
  Filled 2022-02-24: qty 60, 30d supply, fill #0

## 2022-02-24 MED ORDER — METFORMIN HCL 1000 MG PO TABS
1000.0000 mg | ORAL_TABLET | Freq: Two times a day (BID) | ORAL | 1 refills | Status: DC
  Filled 2022-02-24 – 2022-05-10 (×2): qty 180, 90d supply, fill #0

## 2022-02-24 MED ORDER — DAPAGLIFLOZIN PROPANEDIOL 10 MG PO TABS
10.0000 mg | ORAL_TABLET | Freq: Every day | ORAL | 1 refills | Status: DC
  Filled 2022-02-24 – 2022-05-10 (×2): qty 90, 90d supply, fill #0

## 2022-02-24 MED ORDER — DULOXETINE HCL 60 MG PO CPEP
60.0000 mg | ORAL_CAPSULE | Freq: Every day | ORAL | 1 refills | Status: DC
  Filled 2022-02-24: qty 90, 90d supply, fill #0
  Filled 2022-05-10: qty 90, 90d supply, fill #1

## 2022-02-24 MED ORDER — HYDROCHLOROTHIAZIDE 25 MG PO TABS
25.0000 mg | ORAL_TABLET | Freq: Every day | ORAL | 1 refills | Status: DC
  Filled 2022-02-24: qty 90, 90d supply, fill #0
  Filled 2022-05-10: qty 90, 90d supply, fill #1

## 2022-02-24 MED ORDER — DULOXETINE HCL 30 MG PO CPEP
30.0000 mg | ORAL_CAPSULE | Freq: Every day | ORAL | 1 refills | Status: DC
  Filled 2022-02-24: qty 90, 90d supply, fill #0
  Filled 2022-05-10: qty 90, 90d supply, fill #1

## 2022-02-24 MED ORDER — INSULIN GLARGINE-YFGN 100 UNIT/ML ~~LOC~~ SOPN
100.0000 [IU] | PEN_INJECTOR | Freq: Every day | SUBCUTANEOUS | 1 refills | Status: DC
  Filled 2022-02-24: qty 90, 90d supply, fill #0

## 2022-02-24 MED ORDER — ATORVASTATIN CALCIUM 10 MG PO TABS
10.0000 mg | ORAL_TABLET | Freq: Every day | ORAL | 1 refills | Status: DC
  Filled 2022-02-24: qty 90, 90d supply, fill #0
  Filled 2022-05-10: qty 90, 90d supply, fill #1

## 2022-02-24 MED ORDER — VARENICLINE TARTRATE 0.5 MG PO TABS
ORAL_TABLET | ORAL | 0 refills | Status: AC
  Filled 2022-02-24: qty 11, 7d supply, fill #0

## 2022-02-24 MED ORDER — METHOCARBAMOL 500 MG PO TABS
500.0000 mg | ORAL_TABLET | Freq: Three times a day (TID) | ORAL | 0 refills | Status: DC | PRN
  Filled 2022-02-24: qty 30, 10d supply, fill #0

## 2022-02-24 MED ORDER — OZEMPIC (1 MG/DOSE) 4 MG/3ML ~~LOC~~ SOPN
1.0000 mg | PEN_INJECTOR | SUBCUTANEOUS | 1 refills | Status: DC
  Filled 2022-02-24 – 2022-04-13 (×2): qty 6, 56d supply, fill #0
  Filled 2022-04-14: qty 9, 84d supply, fill #0

## 2022-02-24 MED ORDER — VARENICLINE TARTRATE 0.5 MG X 11 & 1 MG X 42 PO TBPK
ORAL_TABLET | ORAL | 0 refills | Status: DC
  Filled 2022-02-24: qty 53, fill #0

## 2022-02-24 NOTE — Assessment & Plan Note (Signed)
Last A1c at goal of 7.0. Will repeat A1c with lab visit. Will continue current medication regimen, see med list. Discussed with patient if A1c remains at goal then recommend reducing Semglee to 90 units. Pt verbalized understanding. Continue ambulatory glucose monitoring. Will continue to monitor. ?

## 2022-02-24 NOTE — Assessment & Plan Note (Signed)
-  Last lipid panel: HDL 30, LDL 78. Will repeat lipid panel today, pending lab results if LDL remains above goal of <35 then recommend increasing atorvastatin to 20 mg. Will also repeat hepatic function for medication monitoring. Will continue to monitor. ?

## 2022-02-24 NOTE — Patient Instructions (Signed)
Healthy Living: Exercise ?You will learn why exercise is an important part of a healthy lifestyle. ?To view the content, go to this web address: ?[email or web address] ? ?This video will expire on: 12/29/2023. If you need access to this video following this date, please reach out to the healthcare provider who assigned it to you. ?This information is not intended to replace advice given to you by your health care provider. Make sure you discuss any questions you have with your health care provider. ?Elsevier Patient Education ? 2023 [employer] Inc. ? ?

## 2022-02-24 NOTE — Assessment & Plan Note (Signed)
-  BP elevated on intake, BP repeated and stable. Will continue current medication regimen, see med list. Will continue to monitor. ?

## 2022-02-24 NOTE — Assessment & Plan Note (Signed)
-  Stable. Continue current medication regimen, see med list. Will continue to monitor. 

## 2022-02-24 NOTE — Progress Notes (Signed)
?Established patient visit ? ? ?Patient: Renee Ho   DOB: Jul 27, 1976   46 y.o. Female  MRN: [id number] ?Visit Date: 02/24/2022 ? ?Chief Complaint  ?Patient presents with  ? Follow-up  ? ?Subjective  ?  ?HPI  ?Patient presents for chronic follow-up. Patient has c/o muscle knot behind her neck x 3 days. Has tried massage, heat and ice therapy with minimal relief.  ? ?Diabetes: Pt denies increased urination or thirst. Pt reports medication compliance. No hypoglycemic events. Checking glucose at home. FBS range from 110-115. ? ?HTN: Pt denies chest pain, palpitations, dizziness or lower extremity swelling. Taking medication as directed without side effects. Pt follows a low salt diet. ? ?HLD: Pt taking medication as directed without issues. Reports has been more active. ? ?Tobacco: Patient was started on Wellbutrin SR and reports medication did not help so stopped medication. Reports has not been able to quit.  ? ?Medications: ?Outpatient Medications Prior to Visit  ?Medication Sig  ? benzonatate (TESSALON) 100 MG capsule Take 1-2 capsules (100-200 mg total) by mouth 3 (three) times daily as needed for cough.  ? blood glucose meter kit and supplies KIT Dispense based on patient and insurance preference. Use up to four times daily as directed. (FOR ICD-9 250.00, 250.01).  ? glucose blood (FREESTYLE LITE) test strip USE AS DIRECTED THREE TO FOUR TIMES DAILY  ? ibuprofen (ADVIL) 200 MG tablet Take 800 mg by mouth daily as needed for headache or moderate pain.  ? Insulin Syringe-Needle U-100 (TRUEPLUS INSULIN SYRINGE) 30G X 5/16" 0.5 ML MISC USE AS DIRECTED EVERY DAY with insulin pen  ? [DISCONTINUED] atorvastatin (LIPITOR) 10 MG tablet Take 1 tablet (10 mg total) by mouth daily.  ? [DISCONTINUED] dapagliflozin propanediol (FARXIGA) 10 MG TABS tablet Take 1 tablet (10 mg total) by mouth daily. **PLEASE CONTACT OUR OFFICE TO SCHEDULE A FOLLOW UP FOR FUTURE MED REFILLS**  ? [DISCONTINUED] DULoxetine (CYMBALTA)  30 MG capsule Take 1 capsule (30 mg total) by mouth daily.  ? [DISCONTINUED] DULoxetine (CYMBALTA) 60 MG capsule Take 1 capsule (60 mg total) by mouth daily.  ? [DISCONTINUED] hydrochlorothiazide (HYDRODIURIL) 25 MG tablet Take 1 tablet (25 mg total) by mouth daily.  ? [DISCONTINUED] insulin glargine-yfgn (SEMGLEE, YFGN,) 100 UNIT/ML Pen Inject 100 Units into the skin at bedtime.  ? [DISCONTINUED] Insulin Pen Needle 31G X 8 MM MISC Use as directed (Patient taking differently: Use as directed)  ? [DISCONTINUED] lisinopril (ZESTRIL) 5 MG tablet Take 1 tablet (5 mg total) by mouth daily.  ? [DISCONTINUED] metFORMIN (GLUCOPHAGE) 1000 MG tablet Take 1 tablet (1,000 mg total) by mouth 2 (two) times daily with a meal. **PLEASE CONTACT OUR OFFICE TO SCHEDULE A FOLLOW UP FOR FUTURE MED REFILLS**  ? [DISCONTINUED] Semaglutide, 1 MG/DOSE, (OZEMPIC, 1 MG/DOSE,) 4 MG/3ML SOPN Inject 1 mg into the skin once a week.  ? [DISCONTINUED] Vitamin D, Ergocalciferol, (DRISDOL) 1.25 MG (50000 UNIT) CAPS capsule Take 1 capsule (50,000 Units total) by mouth every 7 (seven) days.  ? omega-3 acid ethyl esters (LOVAZA) 1 g capsule TAKE 2 CAPSULES BY MOUTH TWICE A DAY.  ? [DISCONTINUED] buPROPion ER (WELLBUTRIN SR) 100 MG 12 hr tablet Take 1 tablet (100 mg total) by mouth 2 (two) times daily. (Patient not taking: Reported on 02/24/2022)  ? ?No facility-administered medications prior to visit.  ? ? ?Review of Systems ?Review of Systems:  ?A fourteen system review of systems was performed and found to be positive as per HPI. ? ? ?Last CBC ?Lab  Results  ?Component Value Date  ? WBC 10.9 (H) 08/25/2021  ? HGB 17.7 (H) 08/25/2021  ? HCT 52.8 (H) 08/25/2021  ? MCV 90 08/25/2021  ? MCH 30.3 08/25/2021  ? RDW 13.1 08/25/2021  ? PLT 209 08/25/2021  ? ?Last metabolic panel ?Lab Results  ?Component Value Date  ? GLUCOSE 104 (H) 08/25/2021  ? NA 141 08/25/2021  ? K 4.5 08/25/2021  ? CL 100 08/25/2021  ? CO2 23 08/25/2021  ? BUN 16 08/25/2021  ? CREATININE  0.76 08/25/2021  ? EGFR 97 08/25/2021  ? CALCIUM 10.0 08/25/2021  ? PROT 7.5 08/25/2021  ? ALBUMIN 4.4 08/25/2021  ? LABGLOB 3.1 08/25/2021  ? AGRATIO 1.4 08/25/2021  ? BILITOT 0.3 08/25/2021  ? ALKPHOS 68 08/25/2021  ? AST 13 08/25/2021  ? ALT 5 08/25/2021  ? ANIONGAP 10 12/22/2016  ? ?Last lipids ?Lab Results  ?Component Value Date  ? CHOL 167 08/25/2021  ? HDL 30 (L) 08/25/2021  ? [place] 78 08/25/2021  ? LDLDIRECT 129 (H) 05/22/2013  ? TRIG 368 (H) 08/25/2021  ? CHOLHDL 5.6 (H) 08/25/2021  ? ?Last hemoglobin A1c ?Lab Results  ?Component Value Date  ? HGBA1C 6.9 (A) 08/25/2021  ? ?Last thyroid functions ?Lab Results  ?Component Value Date  ? TSH 1.940 01/12/2021  ? ?Last vitamin D ?Lab Results  ?Component Value Date  ? VD25OH 11.8 (L) 08/25/2021  ? ? ?  02/24/2022  ? 10:23 AM 08/25/2021  ? 10:44 AM 04/21/2021  ?  2:34 PM 03/03/2021  ?  8:07 AM 01/12/2021  ? 10:49 AM  ?Depression screen PHQ 2/9  ?Decreased Interest 0 0 1 1 0  ?Down, Depressed, Hopeless 0 0 1 1 0  ?PHQ - 2 Score 0 0 2 2 0  ?Altered sleeping '3 1 1 3 3  ' ?Tired, decreased energy '3 1 1 2 3  ' ?Change in appetite 0 0 '1 1 3  ' ?Feeling bad or failure about yourself  0 0 0 0 0  ?Trouble concentrating 0 0 '1 1 1  ' ?Moving slowly or fidgety/restless 0 0 0 0 1  ?Suicidal thoughts 0 0 0 0 0  ?PHQ-9 Score '6 2 6 9 11  ' ?Difficult doing work/chores Not difficult at all Not difficult at all Somewhat difficult Not difficult at all   ? ? ?  08/25/2021  ? 10:44 AM 04/21/2021  ?  2:34 PM 03/03/2021  ?  8:07 AM  ?GAD 7 : Generalized Anxiety Score  ?Nervous, Anxious, on Edge '1 1 1  ' ?Control/stop worrying 0 1 1  ?Worry too much - different things 0 1 1  ?Trouble relaxing 0 1 1  ?Restless 0 1 0  ?Easily annoyed or irritable 0 1 1  ?Afraid - awful might happen 0 1 0  ?Total GAD 7 Score '1 7 5  ' ?Anxiety Difficulty Not difficult at all Somewhat difficult Not difficult at all  ? ? ? ? ?  Objective  ?  ?BP 136/77   Pulse 82   Temp 97.7 ?F (36.5 ?C)   Ht '5\' 5"'  (1.651 m)   Wt 255 lb 1.6  oz (115.7 kg)   SpO2 96%   BMI 42.45 kg/m?  ?BP Readings from Last 3 Encounters:  ?02/24/22 136/77  ?02/16/22 132/81  ?08/25/21 138/77  ? ?Wt Readings from Last 3 Encounters:  ?02/24/22 255 lb 1.6 oz (115.7 kg)  ?02/16/22 256 lb 3.2 oz (116.2 kg)  ?10/13/21 260 lb (117.9 kg)  ? ? ?Physical Exam  ?  General:  Pleasant and cooperative, appropriate for stated age.  ?Neuro:  Alert and oriented,  extra-ocular muscles intact  ?HEENT:  Normocephalic, atraumatic, neck supple  ?Skin:  no gross rash, warm, pink. ?Cardiac:  RRR, S1 S2 ?Respiratory: CTA B/L  ?MSK: muscle tension/spasticity noted at the base of the neck which is tender to palpation  ?Vascular:  Ext warm, no cyanosis apprec.; cap RF less 2 sec. ?Psych:  No HI/SI, judgement and insight good, Euthymic mood. Full Affect. ? ? ?No results found for any visits on 02/24/22. ? Assessment & Plan  ?  ? ? ?Problem List Items Addressed This Visit   ? ?  ? Cardiovascular and Mediastinum  ? Hypertension associated with diabetes ([place])  ?  -BP elevated on intake, BP repeated and stable. Will continue current medication regimen, see med list. Will continue to monitor. ? ?  ?  ? Relevant Medications  ? atorvastatin (LIPITOR) 10 MG tablet  ? dapagliflozin propanediol (FARXIGA) 10 MG TABS tablet  ? hydrochlorothiazide (HYDRODIURIL) 25 MG tablet  ? insulin glargine-yfgn (SEMGLEE, YFGN,) 100 UNIT/ML Pen  ? lisinopril (ZESTRIL) 5 MG tablet  ? metFORMIN (GLUCOPHAGE) 1000 MG tablet  ? Semaglutide, 1 MG/DOSE, (OZEMPIC, 1 MG/DOSE,) 4 MG/3ML SOPN  ? Other Relevant Orders  ? CBC  ? Comprehensive metabolic panel  ?  ? Endocrine  ? Type 2 diabetes mellitus with hyperglycemia, with long-term current use of insulin (HCC) - Primary  ?  Last A1c at goal of 7.0. Will repeat A1c with lab visit. Will continue current medication regimen, see med list. Discussed with patient if A1c remains at goal then recommend reducing Semglee to 90 units. Pt verbalized understanding. Continue ambulatory glucose  monitoring. Will continue to monitor. ? ?  ?  ? Relevant Medications  ? atorvastatin (LIPITOR) 10 MG tablet  ? dapagliflozin propanediol (FARXIGA) 10 MG TABS tablet  ? insulin glargine-yfgn (SEMGLEE, YFGN,) 100 U

## 2022-02-25 LAB — LIPID PANEL
Chol/HDL Ratio: 7.3 ratio — ABNORMAL HIGH (ref 0.0–4.4)
Cholesterol, Total: 219 mg/dL — ABNORMAL HIGH (ref 100–199)
HDL: 30 mg/dL — ABNORMAL LOW (ref >39)
LDL Chol Calc (NIH): 129 mg/dL — ABNORMAL HIGH (ref 0–99)
Triglycerides: 334 mg/dL — ABNORMAL HIGH (ref 0–149)
VLDL Cholesterol Cal: 60 mg/dL — ABNORMAL HIGH (ref 5–40)

## 2022-02-25 LAB — COMPREHENSIVE METABOLIC PANEL
ALT: 11 IU/L (ref 0–32)
AST: 18 IU/L (ref 0–40)
Albumin/Globulin Ratio: 1.6 (ref 1.2–2.2)
Albumin: 4.3 g/dL (ref 3.8–4.8)
Alkaline Phosphatase: 65 IU/L (ref 44–121)
BUN/Creatinine Ratio: 11 (ref 9–23)
BUN: 9 mg/dL (ref 6–24)
Bilirubin Total: 0.4 mg/dL (ref 0.0–1.2)
CO2: 24 mmol/L (ref 20–29)
Calcium: 9.5 mg/dL (ref 8.7–10.2)
Chloride: 100 mmol/L (ref 96–106)
Creatinine, Ser: 0.85 mg/dL (ref 0.57–1.00)
Globulin, Total: 2.7 g/dL (ref 1.5–4.5)
Glucose: 93 mg/dL (ref 70–99)
Potassium: 3.8 mmol/L (ref 3.5–5.2)
Sodium: 139 mmol/L (ref 134–144)
Total Protein: 7 g/dL (ref 6.0–8.5)
eGFR: 84 mL/min/{1.73_m2} (ref >59)

## 2022-02-25 LAB — CBC
Hematocrit: 52.7 % — ABNORMAL HIGH (ref 34.0–46.6)
Hemoglobin: 17.9 g/dL — ABNORMAL HIGH (ref 11.1–15.9)
MCH: 30.4 pg (ref 26.6–33.0)
MCHC: 34 g/dL (ref 31.5–35.7)
MCV: 90 fL (ref 79–97)
Platelets: 170 10*3/uL (ref 150–450)
RBC: 5.88 x10E6/uL — ABNORMAL HIGH (ref 3.77–5.28)
RDW: 13.3 % (ref 11.7–15.4)
WBC: 8.6 10*3/uL (ref 3.4–10.8)

## 2022-02-25 LAB — VITAMIN D 25 HYDROXY (VIT D DEFICIENCY, FRACTURES): Vit D, 25-Hydroxy: 16.5 ng/mL — ABNORMAL LOW (ref 30.0–100.0)

## 2022-02-25 LAB — HEMOGLOBIN A1C
Est. average glucose Bld gHb Est-mCnc: 174 mg/dL
Hgb A1c MFr Bld: 7.7 % — ABNORMAL HIGH (ref 4.8–5.6)

## 2022-02-25 LAB — TSH: TSH: 1.81 u[IU]/mL (ref 0.450–4.500)

## 2022-03-24 ENCOUNTER — Other Ambulatory Visit (HOSPITAL_COMMUNITY): Payer: Self-pay

## 2022-03-24 ENCOUNTER — Telehealth: Payer: No Typology Code available for payment source | Admitting: Physician Assistant

## 2022-03-24 DIAGNOSIS — J028 Acute pharyngitis due to other specified organisms: Secondary | ICD-10-CM

## 2022-03-24 DIAGNOSIS — B9689 Other specified bacterial agents as the cause of diseases classified elsewhere: Secondary | ICD-10-CM

## 2022-03-24 MED ORDER — AMOXICILLIN 500 MG PO CAPS
500.0000 mg | ORAL_CAPSULE | Freq: Two times a day (BID) | ORAL | 0 refills | Status: AC
  Filled 2022-03-24: qty 20, 10d supply, fill #0

## 2022-03-24 NOTE — Progress Notes (Signed)

## 2022-03-26 ENCOUNTER — Telehealth: Payer: No Typology Code available for payment source | Admitting: Emergency Medicine

## 2022-03-26 ENCOUNTER — Other Ambulatory Visit (HOSPITAL_COMMUNITY): Payer: Self-pay

## 2022-03-26 DIAGNOSIS — H109 Unspecified conjunctivitis: Secondary | ICD-10-CM

## 2022-03-26 MED ORDER — POLYMYXIN B-TRIMETHOPRIM 10000-0.1 UNIT/ML-% OP SOLN
1.0000 [drp] | Freq: Four times a day (QID) | OPHTHALMIC | 0 refills | Status: DC
Start: 2022-03-26 — End: 2022-06-20
  Filled 2022-03-26: qty 10, 5d supply, fill #0

## 2022-03-26 NOTE — Progress Notes (Signed)

## 2022-04-14 ENCOUNTER — Other Ambulatory Visit (HOSPITAL_COMMUNITY): Payer: Self-pay

## 2022-04-21 ENCOUNTER — Other Ambulatory Visit (HOSPITAL_COMMUNITY): Payer: Self-pay

## 2022-05-10 ENCOUNTER — Other Ambulatory Visit (HOSPITAL_COMMUNITY): Payer: Self-pay

## 2022-06-19 ENCOUNTER — Emergency Department (HOSPITAL_BASED_OUTPATIENT_CLINIC_OR_DEPARTMENT_OTHER): Payer: No Typology Code available for payment source

## 2022-06-19 ENCOUNTER — Encounter (HOSPITAL_COMMUNITY): Payer: Self-pay

## 2022-06-19 ENCOUNTER — Other Ambulatory Visit: Payer: Self-pay

## 2022-06-19 ENCOUNTER — Inpatient Hospital Stay (HOSPITAL_BASED_OUTPATIENT_CLINIC_OR_DEPARTMENT_OTHER)
Admission: EM | Admit: 2022-06-19 | Discharge: 2022-06-23 | DRG: 439 | Disposition: A | Discharge status: Home / Self Care | Payer: No Typology Code available for payment source | Attending: Family Medicine | Admitting: Family Medicine

## 2022-06-19 ENCOUNTER — Encounter (HOSPITAL_BASED_OUTPATIENT_CLINIC_OR_DEPARTMENT_OTHER): Payer: Self-pay | Admitting: Internal Medicine

## 2022-06-19 DIAGNOSIS — Z823 Family history of stroke: Secondary | ICD-10-CM | POA: Diagnosis not present

## 2022-06-19 DIAGNOSIS — G4733 Obstructive sleep apnea (adult) (pediatric): Secondary | ICD-10-CM | POA: Diagnosis present

## 2022-06-19 DIAGNOSIS — Z7984 Long term (current) use of oral hypoglycemic drugs: Secondary | ICD-10-CM | POA: Diagnosis not present

## 2022-06-19 DIAGNOSIS — Z885 Allergy status to narcotic agent status: Secondary | ICD-10-CM

## 2022-06-19 DIAGNOSIS — E7800 Pure hypercholesterolemia, unspecified: Secondary | ICD-10-CM | POA: Diagnosis present

## 2022-06-19 DIAGNOSIS — Z803 Family history of malignant neoplasm of breast: Secondary | ICD-10-CM | POA: Diagnosis not present

## 2022-06-19 DIAGNOSIS — E282 Polycystic ovarian syndrome: Secondary | ICD-10-CM | POA: Diagnosis present

## 2022-06-19 DIAGNOSIS — Z888 Allergy status to other drugs, medicaments and biological substances status: Secondary | ICD-10-CM | POA: Diagnosis not present

## 2022-06-19 DIAGNOSIS — Z833 Family history of diabetes mellitus: Secondary | ICD-10-CM

## 2022-06-19 DIAGNOSIS — K859 Acute pancreatitis without necrosis or infection, unspecified: Secondary | ICD-10-CM

## 2022-06-19 DIAGNOSIS — Z6841 Body Mass Index (BMI) 40.0 and over, adult: Secondary | ICD-10-CM | POA: Diagnosis not present

## 2022-06-19 DIAGNOSIS — E1165 Type 2 diabetes mellitus with hyperglycemia: Secondary | ICD-10-CM

## 2022-06-19 DIAGNOSIS — T502X5A Adverse effect of carbonic-anhydrase inhibitors, benzothiadiazides and other diuretics, initial encounter: Secondary | ICD-10-CM | POA: Diagnosis present

## 2022-06-19 DIAGNOSIS — N179 Acute kidney failure, unspecified: Secondary | ICD-10-CM | POA: Diagnosis present

## 2022-06-19 DIAGNOSIS — F17210 Nicotine dependence, cigarettes, uncomplicated: Secondary | ICD-10-CM | POA: Diagnosis present

## 2022-06-19 DIAGNOSIS — Z794 Long term (current) use of insulin: Secondary | ICD-10-CM

## 2022-06-19 DIAGNOSIS — K858 Other acute pancreatitis without necrosis or infection: Secondary | ICD-10-CM | POA: Diagnosis not present

## 2022-06-19 DIAGNOSIS — K5790 Diverticulosis of intestine, part unspecified, without perforation or abscess without bleeding: Secondary | ICD-10-CM | POA: Diagnosis present

## 2022-06-19 DIAGNOSIS — I152 Hypertension secondary to endocrine disorders: Secondary | ICD-10-CM | POA: Diagnosis present

## 2022-06-19 DIAGNOSIS — I10 Essential (primary) hypertension: Secondary | ICD-10-CM | POA: Diagnosis not present

## 2022-06-19 DIAGNOSIS — T50995A Adverse effect of other drugs, medicaments and biological substances, initial encounter: Secondary | ICD-10-CM | POA: Diagnosis present

## 2022-06-19 DIAGNOSIS — F32A Depression, unspecified: Secondary | ICD-10-CM | POA: Diagnosis present

## 2022-06-19 DIAGNOSIS — Z720 Tobacco use: Secondary | ICD-10-CM | POA: Diagnosis present

## 2022-06-19 DIAGNOSIS — E781 Pure hyperglyceridemia: Secondary | ICD-10-CM | POA: Diagnosis not present

## 2022-06-19 DIAGNOSIS — Z83438 Family history of other disorder of lipoprotein metabolism and other lipidemia: Secondary | ICD-10-CM

## 2022-06-19 DIAGNOSIS — Z818 Family history of other mental and behavioral disorders: Secondary | ICD-10-CM

## 2022-06-19 DIAGNOSIS — Z79899 Other long term (current) drug therapy: Secondary | ICD-10-CM

## 2022-06-19 DIAGNOSIS — E66813 Obesity, class 3: Secondary | ICD-10-CM | POA: Diagnosis present

## 2022-06-19 DIAGNOSIS — K853 Drug induced acute pancreatitis without necrosis or infection: Secondary | ICD-10-CM | POA: Diagnosis not present

## 2022-06-19 DIAGNOSIS — G4730 Sleep apnea, unspecified: Secondary | ICD-10-CM | POA: Diagnosis present

## 2022-06-19 HISTORY — DX: Acute pancreatitis without necrosis or infection, unspecified: K85.90

## 2022-06-19 LAB — CBC
HCT: 49.6 % — ABNORMAL HIGH (ref 36.0–46.0)
Hemoglobin: 17.4 g/dL — ABNORMAL HIGH (ref 12.0–15.0)
MCH: 30.8 pg (ref 26.0–34.0)
MCHC: 35.1 g/dL (ref 30.0–36.0)
MCV: 87.8 fL (ref 80.0–100.0)
Platelets: 206 10*3/uL (ref 150–400)
RBC: 5.65 MIL/uL — ABNORMAL HIGH (ref 3.87–5.11)
RDW: 13.4 % (ref 11.5–15.5)
WBC: 13.1 10*3/uL — ABNORMAL HIGH (ref 4.0–10.5)
nRBC: 0 % (ref 0.0–0.2)

## 2022-06-19 LAB — URINALYSIS, ROUTINE W REFLEX MICROSCOPIC
Bilirubin Urine: NEGATIVE
Glucose, UA: >1000 mg/dL — AB
Hgb urine dipstick: NEGATIVE
Ketones, ur: NEGATIVE mg/dL
Leukocytes,Ua: NEGATIVE
Nitrite: NEGATIVE
Protein, ur: NEGATIVE mg/dL
Specific Gravity, Urine: 1.035 — ABNORMAL HIGH (ref 1.005–1.030)
pH: 5.5 (ref 5.0–8.0)

## 2022-06-19 LAB — COMPREHENSIVE METABOLIC PANEL
ALT: 5 U/L (ref 0–44)
AST: 11 U/L — ABNORMAL LOW (ref 15–41)
Albumin: 4.3 g/dL (ref 3.5–5.0)
Alkaline Phosphatase: 48 U/L (ref 38–126)
Anion gap: 12 (ref 5–15)
BUN: 24 mg/dL — ABNORMAL HIGH (ref 6–20)
CO2: 25 mmol/L (ref 22–32)
Calcium: 9.9 mg/dL (ref 8.9–10.3)
Chloride: 99 mmol/L (ref 98–111)
Creatinine, Ser: 1.16 mg/dL — ABNORMAL HIGH (ref 0.44–1.00)
GFR, Estimated: 58 mL/min — ABNORMAL LOW (ref >60)
Glucose, Bld: 212 mg/dL — ABNORMAL HIGH (ref 70–99)
Potassium: 3.6 mmol/L (ref 3.5–5.1)
Sodium: 136 mmol/L (ref 135–145)
Total Bilirubin: 0.6 mg/dL (ref 0.3–1.2)
Total Protein: 7.6 g/dL (ref 6.5–8.1)

## 2022-06-19 LAB — GLUCOSE, CAPILLARY
Glucose-Capillary: 170 mg/dL — ABNORMAL HIGH (ref 70–99)
Glucose-Capillary: 94 mg/dL (ref 70–99)

## 2022-06-19 LAB — CBG MONITORING, ED
Glucose-Capillary: 177 mg/dL — ABNORMAL HIGH (ref 70–99)
Glucose-Capillary: 148 mg/dL — ABNORMAL HIGH (ref 70–99)

## 2022-06-19 LAB — LIPASE, BLOOD: Lipase: 2849 U/L — ABNORMAL HIGH (ref 11–51)

## 2022-06-19 MED ORDER — ENOXAPARIN SODIUM 60 MG/0.6ML IJ SOSY
60.0000 mg | PREFILLED_SYRINGE | INTRAMUSCULAR | Status: DC
  Administered 2022-06-19 – 2022-06-22 (×4): 60 mg via SUBCUTANEOUS
  Filled 2022-06-19 (×4): qty 0.6

## 2022-06-19 MED ORDER — ACETAMINOPHEN 650 MG RE SUPP: 650.0000 mg | Freq: Four times a day (QID) | RECTAL | Status: DC | PRN

## 2022-06-19 MED ORDER — ONDANSETRON HCL 4 MG/2ML IJ SOLN
4.0000 mg | Freq: Four times a day (QID) | INTRAMUSCULAR | Status: DC | PRN
  Administered 2022-06-19 – 2022-06-20 (×3): 4 mg via INTRAVENOUS
  Filled 2022-06-19 (×4): qty 2

## 2022-06-19 MED ORDER — LACTATED RINGERS IV BOLUS
1000.0000 mL | Freq: Once | INTRAVENOUS | Status: AC
  Administered 2022-06-19: 1000 mL via INTRAVENOUS

## 2022-06-19 MED ORDER — IOHEXOL 300 MG/ML  SOLN
100.0000 mL | Freq: Once | INTRAMUSCULAR | Status: AC | PRN
  Administered 2022-06-19: 100 mL via INTRAVENOUS

## 2022-06-19 MED ORDER — POTASSIUM CHLORIDE IN NACL 20-0.9 MEQ/L-% IV SOLN: INTRAVENOUS | Status: DC

## 2022-06-19 MED ORDER — ONDANSETRON HCL 4 MG/2ML IJ SOLN
4.0000 mg | Freq: Once | INTRAMUSCULAR | Status: AC
  Administered 2022-06-19: 4 mg via INTRAVENOUS
  Filled 2022-06-19: qty 2

## 2022-06-19 MED ORDER — HYDROMORPHONE HCL 1 MG/ML IJ SOLN
1.0000 mg | Freq: Once | INTRAMUSCULAR | Status: AC
  Administered 2022-06-19: 1 mg via INTRAVENOUS
  Filled 2022-06-19: qty 1

## 2022-06-19 MED ORDER — SODIUM CHLORIDE 0.9 % IV SOLN: INTRAVENOUS | Status: DC

## 2022-06-19 MED ORDER — SODIUM CHLORIDE 0.9 % IV BOLUS
1000.0000 mL | Freq: Once | INTRAVENOUS | Status: AC
  Administered 2022-06-19: 1000 mL via INTRAVENOUS

## 2022-06-19 MED ORDER — ONDANSETRON 4 MG PO TBDP
4.0000 mg | ORAL_TABLET | Freq: Once | ORAL | Status: AC | PRN
  Administered 2022-06-19: 4 mg via ORAL
  Filled 2022-06-19: qty 1

## 2022-06-19 MED ORDER — ACETAMINOPHEN 325 MG PO TABS
650.0000 mg | ORAL_TABLET | Freq: Four times a day (QID) | ORAL | Status: DC | PRN
  Administered 2022-06-20 (×2): 650 mg via ORAL
  Filled 2022-06-19 (×2): qty 2

## 2022-06-19 MED ORDER — PROCHLORPERAZINE EDISYLATE 10 MG/2ML IJ SOLN
10.0000 mg | Freq: Once | INTRAMUSCULAR | Status: AC
  Administered 2022-06-19: 10 mg via INTRAVENOUS
  Filled 2022-06-19: qty 2

## 2022-06-19 MED ORDER — PANTOPRAZOLE SODIUM 40 MG IV SOLR
40.0000 mg | Freq: Every day | INTRAVENOUS | Status: DC
Start: 2022-06-19 — End: 2022-06-23
  Administered 2022-06-19 – 2022-06-23 (×5): 40 mg via INTRAVENOUS
  Filled 2022-06-19 (×5): qty 10

## 2022-06-19 MED ORDER — POTASSIUM CHLORIDE IN NACL 20-0.9 MEQ/L-% IV SOLN
INTRAVENOUS | Status: DC
  Filled 2022-06-19 (×3): qty 1000

## 2022-06-19 MED ORDER — HYDROMORPHONE HCL 1 MG/ML IJ SOLN
1.0000 mg | INTRAMUSCULAR | Status: DC | PRN
  Administered 2022-06-19 – 2022-06-20 (×8): 1 mg via INTRAVENOUS
  Filled 2022-06-19 (×8): qty 1

## 2022-06-19 MED ORDER — LACTATED RINGERS IV SOLN: INTRAVENOUS | Status: DC

## 2022-06-19 NOTE — ED Notes (Signed)
Report given to [facility]. 

## 2022-06-19 NOTE — H&P (Signed)
History and Physical    Patient: Renee Ho OLM:[id number] DOB: 10-Nov-1975 DOA: 06/19/2022 DOS: the patient was seen and examined on 06/19/2022 PCP: [name], PA-C  Patient coming from: Home  Chief Complaint:  Chief Complaint  Patient presents with   Abdominal Pain   HPI: Renee Ho is a 46 y.o. female with medical history significant of depression, type 2 diabetes, diverticulosis, hyperlipidemia, hypertension, OSA on CPAP, polycystic ovarian syndrome presented to the emergency department at [facility] with complaints of abdominal pain associated with bloating and nausea since yesterday morning.  Her appetite is decreased.  No vomiting or diarrhea, constipation, melena or hematochezia.  No fever, chills or night sweats. No sore throat, rhinorrhea, dyspnea, wheezing or hemoptysis.  No chest pain, palpitations, diaphoresis, PND, orthopnea or pitting edema of the lower extremities. No flank pain, dysuria, frequency or hematuria.  No polyuria, polydipsia, polyphagia or blurred vision.  ED course: Initial vital signs were temperature 98.2 F, pulse 95, respiration 18, BP 152/78 mmHg O2 sat 99% on room air.  The patient received 1000 mL of normal saline bolus, 4 mg of Zofran ODT and IV hydromorphone for pain.  Lab work: Her urinalysis showed a specific gravity of 1.0 35 and glucosuria more than 1000 mg/dL.  CBC with a white count 13.1, hemoglobin 17.4 g deciliter platelets 206.  Imaging: CT abdomen/pelvis with contrast is showing very subtle peripancreatic edema, mainly around the head and the body of the pancreas with some very subtle edema noted in the anterior aspect of the pancreatic tail.  There was aortic atherosclerosis.  There is no other acute finding.  Review of Systems: As mentioned in the history of present illness. All other systems reviewed and are negative. Past Medical History:  Diagnosis Date   Depression    Diabetes mellitus    Diabetes  mellitus without complication ([place])    Diverticula of intestine    High cholesterol    Hypertension    OSA on CPAP    CPAP pressure= 15   Polycystic ovarian syndrome    Sleep apnea    on CPAP with pressure setting= 15   Past Surgical History:  Procedure Laterality Date   ABDOMINAL HYSTERECTOMY     CYST REMOVAL NECK     KNEE ARTHROSCOPY  07/11/2012   Procedure: ARTHROSCOPY KNEE;  Surgeon: Meredith Pel, MD;  Location: [place];  Service: Orthopedics;  Laterality: Right;  Right knee arthroscopy with debridement   KNEE SURGERY     OVARIAN CYST REMOVAL     WISDOM TOOTH EXTRACTION  05/11/2017   Social History:  reports that she has been smoking cigarettes. She has a 10.00 pack-year smoking history. She has never used smokeless tobacco. She reports that she does not drink alcohol and does not use drugs.  Allergies  Allergen Reactions   Morphine And Related Other (See Comments)    Migraines   Reglan [Metoclopramide] Other (See Comments)    Psychotic episodes   Toradol [Ketorolac Tromethamine] Rash    Family History  Problem Relation Age of Onset   Cancer Mother        breast   Mental illness Mother    Depression Mother    Hyperlipidemia Mother    Breast cancer Mother    Healthy Father    Hyperlipidemia Father    Diabetes Maternal Grandmother    Hyperlipidemia Maternal Grandmother    Cancer Maternal Grandfather        unknown type   Depression Maternal  Grandfather    Hyperlipidemia Maternal Grandfather    Stroke Paternal Grandmother    Hyperlipidemia Paternal Grandmother    Hyperlipidemia Paternal Grandfather    Sleep apnea Neg Hx     Prior to Admission medications   Medication Sig Start Date End Date Taking? Authorizing Provider  atorvastatin (LIPITOR) 10 MG tablet Take 1 tablet (10 mg total) by mouth daily. 02/24/22   [name], PA-C  benzonatate (TESSALON) 100 MG capsule Take 1-2 capsules (100-200 mg total) by mouth 3 (three) times daily as needed for cough.  10/13/21   [name], PA-C  blood glucose meter kit and supplies KIT Dispense based on patient and insurance preference. Use up to four times daily as directed. (FOR ICD-9 250.00, 250.01). 04/07/20   [name], PA-C  dapagliflozin propanediol (FARXIGA) 10 MG TABS tablet Take 1 tablet (10 mg total) by mouth daily. 02/24/22   [name], PA-C  DULoxetine (CYMBALTA) 30 MG capsule Take 1 capsule (30 mg total) by mouth daily. 02/24/22   [name], PA-C  DULoxetine (CYMBALTA) 60 MG capsule Take 1 capsule (60 mg total) by mouth daily. 02/24/22   [name], PA-C  glucose blood (FREESTYLE LITE) test strip USE AS DIRECTED THREE TO FOUR TIMES DAILY 02/24/21   [name], PA-C  hydrochlorothiazide (HYDRODIURIL) 25 MG tablet Take 1 tablet (25 mg total) by mouth daily. 02/24/22   [name], PA-C  ibuprofen (ADVIL) 200 MG tablet Take 800 mg by mouth daily as needed for headache or moderate pain.    [provider]  insulin glargine-yfgn (SEMGLEE, YFGN,) 100 UNIT/ML Pen Inject 100 Units into the skin at bedtime. 02/24/22   [name], PA-C  Insulin Pen Needle 31G X 8 MM MISC Use as directed 02/24/22   [name], PA-C  Insulin Syringe-Needle U-100 (TRUEPLUS INSULIN SYRINGE) 30G X 5/16" 0.5 ML MISC USE AS DIRECTED EVERY DAY with insulin pen 11/20/18   Wendie Agreste, MD  lisinopril (ZESTRIL) 5 MG tablet Take 1 tablet (5 mg total) by mouth daily. 02/24/22   [name], PA-C  metFORMIN (GLUCOPHAGE) 1000 MG tablet Take 1 tablet (1,000 mg total) by mouth 2 (two) times daily with a meal. 02/24/22   Abonza, Maritza, PA-C  methocarbamol (ROBAXIN) 500 MG tablet Take 1 tablet (500 mg total) by mouth every 8 (eight) hours as needed for muscle spasms. 02/24/22   [name], PA-C  omega-3 acid ethyl esters (LOVAZA) 1 g capsule TAKE 2 CAPSULES BY MOUTH TWICE A DAY. 11/26/20 11/26/21  Abonza, Maritza, PA-C  Semaglutide, 1 MG/DOSE, (OZEMPIC, 1 MG/DOSE,) 4 MG/3ML SOPN Inject 1  mg into the skin once a week. 02/24/22   [name], PA-C  trimethoprim-polymyxin b (POLYTRIM) ophthalmic solution Place 1 drop into the right eye every 6 (six) hours. 03/26/22   Carvel Getting, NP  varenicline (CHANTIX) 1 MG tablet Take 1 tablet (1 mg total) by mouth 2 (two) times daily. 02/24/22   [name], PA-C  Vitamin D, Ergocalciferol, (DRISDOL) 1.25 MG (50000 UNIT) CAPS capsule Take 1 capsule (50,000 Units total) by mouth every 7 (seven) days. 02/24/22   [name], PA-C    Physical Exam: Vitals:   06/19/22 0730 06/19/22 0854 06/19/22 0900 06/19/22 0951  BP: 103/63  (!) 109/54 123/68  Pulse: 64  64 62  Resp: 16  16   Temp:  97.8 F (36.6 C)    TempSrc:  Oral    SpO2: 94%  98% 96%  Weight:  Height:       Physical Exam Vitals and nursing note reviewed.  Constitutional:      General: She is awake. She is not in acute distress.    Appearance: She is well-developed. She is obese. She is not ill-appearing.     Interventions: Face mask in place.     Comments: CPAP mask on.  HENT:     Head: Normocephalic.     Nose: No rhinorrhea.     Mouth/Throat:     Mouth: Mucous membranes are moist.  Eyes:     General: No scleral icterus.    Pupils: Pupils are equal, round, and reactive to light.  Neck:     Vascular: No JVD.  Cardiovascular:     Rate and Rhythm: Normal rate and regular rhythm.     Heart sounds: S1 normal and S2 normal.  Pulmonary:     Effort: Pulmonary effort is normal.     Breath sounds: Normal breath sounds.  Abdominal:     General: Bowel sounds are normal.     Palpations: Abdomen is soft.     Tenderness: There is abdominal tenderness in the right upper quadrant, epigastric area and left upper quadrant. There is right CVA tenderness and left CVA tenderness. There is no guarding or rebound.  Musculoskeletal:     Cervical back: Neck supple.     Right lower leg: No edema.     Left lower leg: No edema.  Skin:    General: Skin is warm and dry.   Neurological:     General: No focal deficit present.     Mental Status: She is alert and oriented to person, place, and time.  Psychiatric:        Mood and Affect: Mood normal.        Behavior: Behavior normal. Behavior is cooperative.   Data Reviewed:  There are no new results to review at this time.  Assessment and Plan: Principal Problem:   Acute pancreatitis Inpatient/MedSurg. Continue IV fluids. Keep n.p.o. for now. Analgesics as needed. Antiemetics as needed. Pantoprazole 40 mg IVP daily. Follow CBC, CMP and lipase in AM.  Active Problems:   Type 2 diabetes mellitus with hyperglycemia,  with long-term current use of insulin (HCC) Currently NPO. Hold insulin and metformin. CBG monitoring every 6 hours while NPO.    HYPERTRIGLYCERIDEMIA Check fasting lipids. Resume atorvastatin once cleared for oral intake.    Essential hypertension Blood pressure has remained normal. Parenteral antihypertensives as needed.    Sleep apnea Continue CPAP at bedtime. Continue CPAP as needed. May use home device.    Depression Resume duloxetine once cleared for oral intake.    Class 3 obesity (HCC) BMI 42.6 kg/m. Lifestyle modifications. Follow-up with primary care provider.    Tobacco abuse Smoking cessation encouraged. Declined nicotine replacement therapy.    Advance Care Planning:   Code Status: Full code.  Consults:   Family Communication:   Severity of Illness: The appropriate patient status for this patient is OBSERVATION. Observation status is judged to be reasonable and necessary in order to provide the required intensity of service to ensure the patient's safety. The patient's presenting symptoms, physical exam findings, and initial radiographic and laboratory data in the context of their medical condition is felt to place them at decreased risk for further clinical deterioration. Furthermore, it is anticipated that the patient will be medically stable for  discharge from the hospital within 2 midnights of admission.   Author: Reubin Milan, MD 06/19/2022  10:28 AM  For on call review [email or web address].   This document was prepared using Dragon voice recognition software and may contain some unintended transcription errors.

## 2022-06-19 NOTE — ED Notes (Signed)
Moved pt from room 12 to room 11, as room 11 had thermostat in room and pt was warm.

## 2022-06-19 NOTE — ED Triage Notes (Signed)
Patient coming to ED for evaluation of abdominal pain.  Reports pain started yesterday.  C/o bloating and severe nausea.  No diarrhea.  No reports of fevers.

## 2022-06-19 NOTE — Progress Notes (Signed)
Plan of Care Note for accepted transfer   Patient: Renee Ho MRN: [id number]   DOA: 06/19/2022  Facility requesting transfer: [facility] Requesting Provider: Dr. Judd Lien Reason for transfer: Acute Pancreatitis Facility course:   46 year old female with with past medical history of polycystic ovarian disease, sleep apnea, diabetes mellitus type 2, hypertriglyceridemia, hypertension presenting to [facility] emergency department with complaints of abdominal pain nausea and poor oral intake.  Upon evaluation in the emergency department initial work-up revealed markedly elevated lipase of 2849.  CT imaging of the abdomen with contrast revealing evidence of peripancreatic edema around the head and body of the pancreas as well as some additional edema noted around the aspect of the pancreatic tail consistent with acute pancreatitis.  No evidence of hepatobiliary obstruction noted.  Review of older labs reveals patient has had significant hypertriglyceridemia in the past and therefore recurrent severe hypertriglyceridemia may be the culprit here.  Patient is already received 1 L of isotonic fluids in the emergency department as well as opiate-based analgesics.  Patient has been accepted to a MedSurg bed at [facility].  Plan of care: The patient is accepted for admission to Med-surg  unit, at [facility]..    Author: Marinda Elk, MD 06/19/2022  Check [email or web address] for on-call coverage.  Nursing staff, Please call [facility] Admits & Consults System-Wide number on Amion as soon as patient's arrival, so appropriate admitting provider can evaluate the pt.

## 2022-06-19 NOTE — ED Provider Notes (Signed)
[facility] EMERGENCY DEPT Provider Note   CSN: [id number] Arrival date & time: 06/19/22  0235     History  Chief Complaint  Patient presents with   Abdominal Pain    Renee Ho is a 46 y.o. female.  Patient is a 46 year old female with past medical history of polycystic ovaries, hyperlipidemia, diabetes, hypertension.  Patient presenting today for evaluation of abdominal pain.  She describes upper abdominal pain that has been worsening since waking up yesterday morning.  She denies any fevers or chills, but does admit to feeling nauseated.  There has been no vomiting.  She has been having difficulty eating secondary to the pain.  Only prior abdominal surgery is a hysterectomy.  She still has her gallbladder and appendix.  The history is provided by the patient.       Home Medications Prior to Admission medications   Medication Sig Start Date End Date Taking? Authorizing Provider  atorvastatin (LIPITOR) 10 MG tablet Take 1 tablet (10 mg total) by mouth daily. 02/24/22   [name], PA-C  benzonatate (TESSALON) 100 MG capsule Take 1-2 capsules (100-200 mg total) by mouth 3 (three) times daily as needed for cough. 10/13/21   [name], PA-C  blood glucose meter kit and supplies KIT Dispense based on patient and insurance preference. Use up to four times daily as directed. (FOR ICD-9 250.00, 250.01). 04/07/20   [name], PA-C  dapagliflozin propanediol (FARXIGA) 10 MG TABS tablet Take 1 tablet (10 mg total) by mouth daily. 02/24/22   [name], PA-C  DULoxetine (CYMBALTA) 30 MG capsule Take 1 capsule (30 mg total) by mouth daily. 02/24/22   [name], PA-C  DULoxetine (CYMBALTA) 60 MG capsule Take 1 capsule (60 mg total) by mouth daily. 02/24/22   [name], PA-C  glucose blood (FREESTYLE LITE) test strip USE AS DIRECTED THREE TO FOUR TIMES DAILY 02/24/21   [name], PA-C  hydrochlorothiazide (HYDRODIURIL) 25 MG tablet  Take 1 tablet (25 mg total) by mouth daily. 02/24/22   [name], PA-C  ibuprofen (ADVIL) 200 MG tablet Take 800 mg by mouth daily as needed for headache or moderate pain.    [provider]  insulin glargine-yfgn (SEMGLEE, YFGN,) 100 UNIT/ML Pen Inject 100 Units into the skin at bedtime. 02/24/22   [name], PA-C  Insulin Pen Needle 31G X 8 MM MISC Use as directed 02/24/22   [name], PA-C  Insulin Syringe-Needle U-100 (TRUEPLUS INSULIN SYRINGE) 30G X 5/16" 0.5 ML MISC USE AS DIRECTED EVERY DAY with insulin pen 11/20/18   Wendie Agreste, MD  lisinopril (ZESTRIL) 5 MG tablet Take 1 tablet (5 mg total) by mouth daily. 02/24/22   [name], PA-C  metFORMIN (GLUCOPHAGE) 1000 MG tablet Take 1 tablet (1,000 mg total) by mouth 2 (two) times daily with a meal. 02/24/22   Abonza, Maritza, PA-C  methocarbamol (ROBAXIN) 500 MG tablet Take 1 tablet (500 mg total) by mouth every 8 (eight) hours as needed for muscle spasms. 02/24/22   [name], PA-C  omega-3 acid ethyl esters (LOVAZA) 1 g capsule TAKE 2 CAPSULES BY MOUTH TWICE A DAY. 11/26/20 11/26/21  Abonza, Maritza, PA-C  Semaglutide, 1 MG/DOSE, (OZEMPIC, 1 MG/DOSE,) 4 MG/3ML SOPN Inject 1 mg into the skin once a week. 02/24/22   [name], PA-C  trimethoprim-polymyxin b (POLYTRIM) ophthalmic solution Place 1 drop into the right eye every 6 (six) hours. 03/26/22   Carvel Getting, NP  varenicline (CHANTIX) 1 MG tablet Take  1 tablet (1 mg total) by mouth 2 (two) times daily. 02/24/22   [name], PA-C  Vitamin D, Ergocalciferol, (DRISDOL) 1.25 MG (50000 UNIT) CAPS capsule Take 1 capsule (50,000 Units total) by mouth every 7 (seven) days. 02/24/22   [name], PA-C      Allergies    Morphine and related, Reglan [metoclopramide], and Toradol [ketorolac tromethamine]    Review of Systems   Review of Systems  All other systems reviewed and are negative.   Physical Exam Updated Vital Signs BP 131/69   Pulse  78   Temp 98.2 F (36.8 C) (Oral)   Resp 18   Ht '5\' 5"'  (1.651 m)   Wt 116.1 kg   SpO2 93%   BMI 42.60 kg/m  Physical Exam Vitals and nursing note reviewed.  Constitutional:      General: She is not in acute distress.    Appearance: She is well-developed. She is not diaphoretic.  HENT:     Head: Normocephalic and atraumatic.  Cardiovascular:     Rate and Rhythm: Normal rate and regular rhythm.     Heart sounds: No murmur heard.    No friction rub. No gallop.  Pulmonary:     Effort: Pulmonary effort is normal. No respiratory distress.     Breath sounds: Normal breath sounds. No wheezing.  Abdominal:     General: Bowel sounds are normal. There is no distension.     Palpations: Abdomen is soft.     Tenderness: There is abdominal tenderness in the epigastric area. There is no right CVA tenderness, left CVA tenderness, guarding or rebound.  Musculoskeletal:        General: Normal range of motion.     Cervical back: Normal range of motion and neck supple.  Skin:    General: Skin is warm and dry.  Neurological:     General: No focal deficit present.     Mental Status: She is alert and oriented to person, place, and time.     ED Results / Procedures / Treatments   Labs (all labs ordered are listed, but only abnormal results are displayed) Labs Reviewed  LIPASE, BLOOD - Abnormal; Notable for the following components:      Result Value   Lipase 2,849 (*)    All other components within normal limits  COMPREHENSIVE METABOLIC PANEL - Abnormal; Notable for the following components:   Glucose, Bld 212 (*)    BUN 24 (*)    Creatinine, Ser 1.16 (*)    AST 11 (*)    GFR, Estimated 58 (*)    All other components within normal limits  CBC - Abnormal; Notable for the following components:   WBC 13.1 (*)    RBC 5.65 (*)    Hemoglobin 17.4 (*)    HCT 49.6 (*)    All other components within normal limits  URINALYSIS, ROUTINE W REFLEX MICROSCOPIC - Abnormal; Notable for the following  components:   Specific Gravity, Urine 1.035 (*)    Glucose, UA >1,000 (*)    All other components within normal limits    EKG None  Radiology No results found.  Procedures Procedures    Medications Ordered in ED Medications  ondansetron (ZOFRAN-ODT) disintegrating tablet 4 mg (4 mg Oral Given 06/19/22 0258)  sodium chloride 0.9 % bolus 1,000 mL (1,000 mLs Intravenous New Bag/Given 06/19/22 0440)  ondansetron (ZOFRAN) injection 4 mg (4 mg Intravenous Given 06/19/22 0439)  HYDROmorphone (DILAUDID) injection 1 mg (1 mg Intravenous Given  06/19/22 0439)    ED Course/ Medical Decision Making/ A&P  This patient presents to the ED for concern of epigastric pain, this involves an extensive number of treatment options, and is a complaint that carries with it a high risk of complications and morbidity.  The differential diagnosis includes acute pancreatitis, acute cholecystitis, peptic ulcers, perforated viscus, small bowel obstruction   Co morbidities that complicate the patient evaluation  None   Additional history obtained:  No additional history or external records needed   Lab Tests:  I Ordered, and personally interpreted labs.  The pertinent results include: CBC with a mild leukocytosis.  Electrolytes are essentially unremarkable, but lipase is elevated at 2900.   Imaging Studies ordered:  I ordered imaging studies including CT scan of the abdomen and pelvis I independently visualized and interpreted imaging which showed peripancreatic edema consistent with acute pancreatitis I agree with the radiologist interpretation   Cardiac Monitoring: / EKG:  None performed   Consultations Obtained:  I requested consultation with the hospitalist,  and discussed lab and imaging findings as well as pertinent plan - they recommend: Admission for IV fluids and pain and nausea control   Problem List / ED Course / Critical interventions / Medication management  Patient presenting  with a 2-day history of epigastric pain that has been worsening.  Work-up shows a lipase of 3000 and CT scan suggestive of acute pancreatitis.  Patient given IV fluids along with Dilaudid and Zofran for pain and nausea.  She is feeling somewhat better, but I feel will require admission for hydration, pain control, and pancreatic rest.  I have spoken with Dr. Cyd Silence who agrees to admit. I have reviewed the patients home medicines and have made adjustments as needed   Social Determinants of Health:  None   Test / Admission - Considered:  Patient to be admitted to the hospitalist service for IV hydration and control of pain and nausea.  Final Clinical Impression(s) / ED Diagnoses Final diagnoses:  None    Rx / DC Orders ED Discharge Orders     None         Veryl Speak, MD 06/19/22 [phone]

## 2022-06-20 ENCOUNTER — Inpatient Hospital Stay (HOSPITAL_COMMUNITY): Payer: No Typology Code available for payment source

## 2022-06-20 DIAGNOSIS — K859 Acute pancreatitis without necrosis or infection, unspecified: Secondary | ICD-10-CM

## 2022-06-20 LAB — CBC
HCT: 48.2 % — ABNORMAL HIGH (ref 36.0–46.0)
Hemoglobin: 15.9 g/dL — ABNORMAL HIGH (ref 12.0–15.0)
MCH: 31 pg (ref 26.0–34.0)
MCHC: 33 g/dL (ref 30.0–36.0)
MCV: 94 fL (ref 80.0–100.0)
Platelets: 179 10*3/uL (ref 150–400)
RBC: 5.13 MIL/uL — ABNORMAL HIGH (ref 3.87–5.11)
RDW: 13.6 % (ref 11.5–15.5)
WBC: 7.5 10*3/uL (ref 4.0–10.5)
nRBC: 0 % (ref 0.0–0.2)

## 2022-06-20 LAB — COMPREHENSIVE METABOLIC PANEL
ALT: 9 U/L (ref 0–44)
AST: 13 U/L — ABNORMAL LOW (ref 15–41)
Albumin: 3.7 g/dL (ref 3.5–5.0)
Alkaline Phosphatase: 47 U/L (ref 38–126)
Anion gap: 6 (ref 5–15)
BUN: 18 mg/dL (ref 6–20)
CO2: 25 mmol/L (ref 22–32)
Calcium: 8.6 mg/dL — ABNORMAL LOW (ref 8.9–10.3)
Chloride: 111 mmol/L (ref 98–111)
Creatinine, Ser: 0.7 mg/dL (ref 0.44–1.00)
GFR, Estimated: >60 mL/min (ref >60)
Glucose, Bld: 85 mg/dL (ref 70–99)
Potassium: 4 mmol/L (ref 3.5–5.1)
Sodium: 142 mmol/L (ref 135–145)
Total Bilirubin: 1 mg/dL (ref 0.3–1.2)
Total Protein: 7 g/dL (ref 6.5–8.1)

## 2022-06-20 LAB — LIPID PANEL
Cholesterol: 172 mg/dL (ref 0–200)
HDL: 25 mg/dL — ABNORMAL LOW (ref >40)
LDL Cholesterol: 104 mg/dL — ABNORMAL HIGH (ref 0–99)
Total CHOL/HDL Ratio: 6.9 RATIO
Triglycerides: 216 mg/dL — ABNORMAL HIGH (ref <150)
VLDL: 43 mg/dL — ABNORMAL HIGH (ref 0–40)

## 2022-06-20 LAB — GLUCOSE, CAPILLARY
Glucose-Capillary: 105 mg/dL — ABNORMAL HIGH (ref 70–99)
Glucose-Capillary: 107 mg/dL — ABNORMAL HIGH (ref 70–99)
Glucose-Capillary: 112 mg/dL — ABNORMAL HIGH (ref 70–99)
Glucose-Capillary: 178 mg/dL — ABNORMAL HIGH (ref 70–99)
Glucose-Capillary: 86 mg/dL (ref 70–99)
Glucose-Capillary: 87 mg/dL (ref 70–99)

## 2022-06-20 LAB — LIPASE, BLOOD: Lipase: 218 U/L — ABNORMAL HIGH (ref 11–51)

## 2022-06-20 MED ORDER — OXYCODONE-ACETAMINOPHEN 5-325 MG PO TABS
1.0000 | ORAL_TABLET | ORAL | Status: DC | PRN
  Administered 2022-06-21 – 2022-06-23 (×7): 2 via ORAL
  Filled 2022-06-20 (×8): qty 2

## 2022-06-20 MED ORDER — PROCHLORPERAZINE EDISYLATE 10 MG/2ML IJ SOLN
10.0000 mg | Freq: Four times a day (QID) | INTRAMUSCULAR | Status: DC | PRN
Start: 2022-06-20 — End: 2022-06-23
  Administered 2022-06-20 – 2022-06-23 (×9): 10 mg via INTRAVENOUS
  Filled 2022-06-20 (×10): qty 2

## 2022-06-20 MED ORDER — INSULIN ASPART 100 UNIT/ML IJ SOLN
0.0000 [IU] | INTRAMUSCULAR | Status: DC
  Administered 2022-06-20: 2 [IU] via SUBCUTANEOUS

## 2022-06-20 MED ORDER — FENTANYL CITRATE PF 50 MCG/ML IJ SOSY
25.0000 ug | PREFILLED_SYRINGE | INTRAMUSCULAR | Status: DC | PRN
  Administered 2022-06-20 – 2022-06-21 (×4): 25 ug via INTRAVENOUS
  Filled 2022-06-20 (×4): qty 1

## 2022-06-20 MED ORDER — HYDROCHLOROTHIAZIDE 25 MG PO TABS
25.0000 mg | ORAL_TABLET | Freq: Every day | ORAL | Status: DC
Start: 2022-06-20 — End: 2022-06-20

## 2022-06-20 MED ORDER — LISINOPRIL 5 MG PO TABS: 5.0000 mg | ORAL_TABLET | Freq: Every day | ORAL | Status: DC

## 2022-06-20 MED ORDER — ZOLPIDEM TARTRATE 5 MG PO TABS
5.0000 mg | ORAL_TABLET | Freq: Every evening | ORAL | Status: DC | PRN
  Administered 2022-06-20 – 2022-06-22 (×3): 5 mg via ORAL
  Filled 2022-06-20 (×3): qty 1

## 2022-06-20 MED ORDER — ATORVASTATIN CALCIUM 10 MG PO TABS: 10.0000 mg | ORAL_TABLET | Freq: Every day | ORAL | Status: DC

## 2022-06-20 MED ORDER — DULOXETINE HCL 60 MG PO CPEP
60.0000 mg | ORAL_CAPSULE | Freq: Every morning | ORAL | Status: DC
  Administered 2022-06-20 – 2022-06-23 (×4): 60 mg via ORAL
  Filled 2022-06-20 (×4): qty 1

## 2022-06-20 MED ORDER — SODIUM CHLORIDE 0.9 % IV SOLN: INTRAVENOUS | Status: DC

## 2022-06-20 NOTE — Progress Notes (Signed)
PROGRESS NOTE    Renee Ho  XNA:[id number] DOB: January 25, 1976 DOA: 06/19/2022 PCP: Mayer Masker, PA-C     Brief Narrative:  Renee Ho is a 46 y.o. female with medical history significant of depression, type 2 diabetes, diverticulosis, hyperlipidemia, hypertension, OSA on CPAP, polycystic ovarian syndrome presented to the emergency department with complaints of abdominal pain associated with bloating and nausea since yesterday morning.  Her appetite is decreased.  CT abdomen/pelvis with contrast is showing very subtle peripancreatic edema, mainly around the head and the body of the pancreas with some very subtle edema noted in the anterior aspect of the pancreatic tail.  She states states that she has 1 alcoholic beverage on her birthday each year.  Has never had pancreatitis before.  New events last 24 hours / Subjective: Asking for ice chips.  Continues to have upper abdomen pain as well as nausea and vomiting especially associated with after getting Dilaudid.  Assessment & Plan:   Principal Problem:   Acute pancreatitis Active Problems:   Type 2 diabetes mellitus with hyperglycemia, with long-term current use of insulin (HCC)   HYPERTRIGLYCERIDEMIA   Essential hypertension   Sleep apnea   Depression   Class 3 obesity (HCC)   Tobacco abuse   Acute pancreatitis -Triglycerides are mildly elevated 216 -Right upper quadrant ultrasound negative -?Medication induced. Takes ozempic, HCTZ, lisinopril, metformin, atorvastatin -Advance to clear liquid diet today, continue IV fluids and antiemetic/analgesic  Diabetes mellitus type 2 -Hold metformin -Sliding scale insulin  Hypertension -Hold lisinopril and HCTZ  Depression -Cymbalta  OSA -CPAP nightly  Morbid obesity -BMI 42   DVT prophylaxis: Lovenox   Code Status: Full code Family Communication: No family at bedside Disposition Plan:  Status is: Inpatient Remains inpatient appropriate  because: IV fluid   Antimicrobials:  Anti-infectives (From admission, onward)    None        Objective: Vitals:   06/19/22 2059 06/20/22 0012 06/20/22 0619 06/20/22 0936  BP: (!) 140/63 (!) 140/75 (!) 113/54 (!) 126/55  Pulse: 75 71 70 66  Resp: 17 16 18 18   Temp: 98.1 F (36.7 C) 98 F (36.7 C) 98.4 F (36.9 C) 98.5 F (36.9 C)  TempSrc: Oral Oral Oral Oral  SpO2: 94% 97% 94% 98%  Weight:      Height:        Intake/Output Summary (Last 24 hours) at 06/20/2022 0944 Last data filed at 06/20/2022 0726 Gross per 24 hour  Intake 1815.08 ml  Output --  Net 1815.08 ml   Filed Weights   06/19/22 0245  Weight: 116.1 kg    Examination:  General exam: Appears calm and comfortable  Respiratory system: Clear to auscultation. Respiratory effort normal. No respiratory distress. No conversational dyspnea.  Cardiovascular system: S1 & S2 heard, RRR. No murmurs. No pedal edema. Gastrointestinal system: Abdomen is nondistended, soft and tender to palpation upper abdomen Central nervous system: Alert and oriented. No focal neurological deficits. Speech clear.  Extremities: Symmetric in appearance  Skin: No rashes, lesions or ulcers on exposed skin  Psychiatry: Judgement and insight appear normal. Mood & affect appropriate.   Data Reviewed: I have personally reviewed following labs and imaging studies  CBC: Recent Labs  Lab 06/19/22 0252 06/20/22 0451  WBC 13.1* 7.5  HGB 17.4* 15.9*  HCT 49.6* 48.2*  MCV 87.8 94.0  PLT 206 179   Basic Metabolic Panel: Recent Labs  Lab 06/19/22 0252 06/20/22 0451  NA 136 142  K 3.6 4.0  CL 99  111  CO2 25 25  GLUCOSE 212* 85  BUN 24* 18  CREATININE 1.16* 0.70  CALCIUM 9.9 8.6*   GFR: Estimated Creatinine Clearance: 109.4 mL/min (by C-G formula based on SCr of 0.7 mg/dL). Liver Function Tests: Recent Labs  Lab 06/19/22 0252 06/20/22 0451  AST 11* 13*  ALT 5 9  ALKPHOS 48 47  BILITOT 0.6 1.0  PROT 7.6 7.0  ALBUMIN 4.3  3.7   Recent Labs  Lab 06/19/22 0252 06/20/22 0451  LIPASE 2,849* 218*   No results for input(s): "AMMONIA" in the last 168 hours. Coagulation Profile: No results for input(s): "INR", "PROTIME" in the last 168 hours. Cardiac Enzymes: No results for input(s): "CKTOTAL", "CKMB", "CKMBINDEX", "TROPONINI" in the last 168 hours. BNP (last 3 results) No results for input(s): "PROBNP" in the last 8760 hours. HbA1C: No results for input(s): "HGBA1C" in the last 72 hours. CBG: Recent Labs  Lab 06/19/22 0851 06/19/22 1129 06/19/22 1713 06/20/22 0010 06/20/22 0601  GLUCAP 148* 170* 94 86 87   Lipid Profile: Recent Labs    06/20/22 0451  CHOL 172  HDL 25*  LDLCALC 104*  TRIG 216*  CHOLHDL 6.9   Thyroid Function Tests: No results for input(s): "TSH", "T4TOTAL", "FREET4", "T3FREE", "THYROIDAB" in the last 72 hours. Anemia Panel: No results for input(s): "VITAMINB12", "FOLATE", "FERRITIN", "TIBC", "IRON", "RETICCTPCT" in the last 72 hours. Sepsis Labs: No results for input(s): "PROCALCITON", "LATICACIDVEN" in the last 168 hours.  No results found for this or any previous visit (from the past 240 hour(s)).    Radiology Studies: US Abdomen Limited RUQ (LIVER/GB)  Result Date: 06/20/2022 CLINICAL DATA:  Pancreatitis, abdominal pain 3 days. EXAM: ULTRASOUND ABDOMEN LIMITED RIGHT UPPER QUADRANT COMPARISON:  CT 06/19/2022 FINDINGS: Gallbladder: No gallstones or wall thickening visualized. No sonographic Murphy sign noted by sonographer. Common bile duct: Diameter: 4 mm. Liver: Difficult evaluation of the liver due to overlying bowel gas and patient body habitus. No focal abnormality. Portal vein is patent on color Doppler imaging with normal direction of blood flow towards the liver. Other: No free fluid. IMPRESSION: No acute hepatobiliary disease. Electronically Signed   By: Elberta Fortis M.D.   On: 06/20/2022 08:44   CT ABDOMEN PELVIS W CONTRAST  Result Date: 06/19/2022 CLINICAL  DATA:  Abdominal pain with nausea and bloating. EXAM: CT ABDOMEN AND PELVIS WITH CONTRAST TECHNIQUE: Multidetector CT imaging of the abdomen and pelvis was performed using the standard protocol following bolus administration of intravenous contrast. RADIATION DOSE REDUCTION: This exam was performed according to the departmental dose-optimization program which includes automated exposure control, adjustment of the mA and/or kV according to patient size and/or use of iterative reconstruction technique. CONTRAST:  OMNIPAQUE IOHEXOL 300 MG/ML  SOLN COMPARISON:  07/11/2013 FINDINGS: Lower chest: Unremarkable. Hepatobiliary: No suspicious focal abnormality within the liver parenchyma. There is no evidence for gallstones, gallbladder wall thickening, or pericholecystic fluid. No intrahepatic or extrahepatic biliary dilation. Pancreas: There is some very subtle peripancreatic edema, mainly around the head and body of pancreas with some very subtle edema noted along the anterior aspect of the pancreatic tail. No main duct dilatation. No underlying focal pancreatic mass lesion. Spleen: No splenomegaly. No focal mass lesion. Adrenals/Urinary Tract: No adrenal nodule or mass. 14 mm well-defined homogeneous low-density exophytic lesion lower pole right kidney is compatible with a tiny cyst. Left kidney unremarkable. No evidence for hydroureter. The urinary bladder appears normal for the degree of distention. Stomach/Bowel: Stomach is moderately distended with food and fluid.  Duodenum is normally positioned as is the ligament of Treitz. No small bowel wall thickening. No small bowel dilatation. The terminal ileum is normal. The appendix is normal. No gross colonic mass. No colonic wall thickening. Vascular/Lymphatic: There is mild atherosclerotic calcification of the abdominal aorta without aneurysm. Upper normal lymph nodes are seen along the celiac axis and in the hepatoduodenal ligament. No retroperitoneal or mesenteric  lymphadenopathy. No pelvic sidewall lymphadenopathy. Reproductive: The uterus is surgically absent. Prominent left ovary is unchanged since 2014 consistent with benign features. No right adnexal mass. Other: No intraperitoneal free fluid. Musculoskeletal: No worrisome lytic or sclerotic osseous abnormality. IMPRESSION: 1. Very subtle peripancreatic edema, mainly around the head and body of pancreas with some very subtle edema noted along the anterior aspect of the pancreatic tail. Imaging features are compatible with acute pancreatitis. 2. Otherwise no acute findings in the abdomen or pelvis. 3. Aortic Atherosclerosis (ICD10-I70.0). Electronically Signed   By: Misty Stanley M.D.   On: 06/19/2022 05:44      Scheduled Meds:  enoxaparin (LOVENOX) injection  60 mg Subcutaneous Q24H   pantoprazole (PROTONIX) IV  40 mg Intravenous Daily   Continuous Infusions:  sodium chloride 125 mL/hr at 06/20/22 0831     LOS: 1 day     Dessa Phi, DO [facility] 06/20/2022, 9:44 AM   Available via Epic secure chat 7am-7pm After these hours, please refer to coverage provider listed on [email or web address]

## 2022-06-21 DIAGNOSIS — K853 Drug induced acute pancreatitis without necrosis or infection: Secondary | ICD-10-CM

## 2022-06-21 LAB — COMPREHENSIVE METABOLIC PANEL
ALT: 9 U/L (ref 0–44)
AST: 13 U/L — ABNORMAL LOW (ref 15–41)
Albumin: 3.6 g/dL (ref 3.5–5.0)
Alkaline Phosphatase: 40 U/L (ref 38–126)
Anion gap: 6 (ref 5–15)
BUN: 11 mg/dL (ref 6–20)
CO2: 23 mmol/L (ref 22–32)
Calcium: 8.6 mg/dL — ABNORMAL LOW (ref 8.9–10.3)
Chloride: 110 mmol/L (ref 98–111)
Creatinine, Ser: 0.66 mg/dL (ref 0.44–1.00)
GFR, Estimated: >60 mL/min (ref >60)
Glucose, Bld: 100 mg/dL — ABNORMAL HIGH (ref 70–99)
Potassium: 3.9 mmol/L (ref 3.5–5.1)
Sodium: 139 mmol/L (ref 135–145)
Total Bilirubin: 0.9 mg/dL (ref 0.3–1.2)
Total Protein: 6.9 g/dL (ref 6.5–8.1)

## 2022-06-21 LAB — CBC
HCT: 44.1 % (ref 36.0–46.0)
Hemoglobin: 14.6 g/dL (ref 12.0–15.0)
MCH: 30.9 pg (ref 26.0–34.0)
MCHC: 33.1 g/dL (ref 30.0–36.0)
MCV: 93.4 fL (ref 80.0–100.0)
Platelets: 156 10*3/uL (ref 150–400)
RBC: 4.72 MIL/uL (ref 3.87–5.11)
RDW: 13.4 % (ref 11.5–15.5)
WBC: 6.7 10*3/uL (ref 4.0–10.5)
nRBC: 0 % (ref 0.0–0.2)

## 2022-06-21 LAB — GLUCOSE, CAPILLARY
Glucose-Capillary: 101 mg/dL — ABNORMAL HIGH (ref 70–99)
Glucose-Capillary: 93 mg/dL (ref 70–99)
Glucose-Capillary: 188 mg/dL — ABNORMAL HIGH (ref 70–99)
Glucose-Capillary: 190 mg/dL — ABNORMAL HIGH (ref 70–99)
Glucose-Capillary: 229 mg/dL — ABNORMAL HIGH (ref 70–99)

## 2022-06-21 LAB — HIV ANTIBODY (ROUTINE TESTING W REFLEX): HIV Screen 4th Generation wRfx: NONREACTIVE

## 2022-06-21 MED ORDER — POLYETHYLENE GLYCOL 3350 17 G PO PACK
17.0000 g | PACK | Freq: Every day | ORAL | Status: DC
  Administered 2022-06-21: 17 g via ORAL
  Filled 2022-06-21 (×3): qty 1

## 2022-06-21 MED ORDER — INSULIN ASPART 100 UNIT/ML IJ SOLN
0.0000 [IU] | Freq: Three times a day (TID) | INTRAMUSCULAR | Status: DC
  Administered 2022-06-21 (×2): 2 [IU] via SUBCUTANEOUS
  Administered 2022-06-22: 3 [IU] via SUBCUTANEOUS
  Administered 2022-06-22: 1 [IU] via SUBCUTANEOUS
  Administered 2022-06-23 (×2): 2 [IU] via SUBCUTANEOUS

## 2022-06-21 MED ORDER — FENTANYL CITRATE PF 50 MCG/ML IJ SOSY
50.0000 ug | PREFILLED_SYRINGE | INTRAMUSCULAR | Status: DC | PRN
  Administered 2022-06-21 – 2022-06-23 (×10): 50 ug via INTRAVENOUS
  Filled 2022-06-21 (×10): qty 1

## 2022-06-21 NOTE — Progress Notes (Signed)
PROGRESS NOTE    Renee Ho  QMV:[id number] DOB: 1976/08/17 DOA: 06/19/2022 PCP: Mayer Masker, PA-C     Brief Narrative:  Renee Ho is a 46 y.o. female with medical history significant of depression, type 2 diabetes, diverticulosis, hyperlipidemia, hypertension, OSA on CPAP, polycystic ovarian syndrome presented to the emergency department with complaints of abdominal pain associated with bloating and nausea since yesterday morning.  Her appetite is decreased.  CT abdomen/pelvis with contrast is showing very subtle peripancreatic edema, mainly around the head and the body of the pancreas with some very subtle edema noted in the anterior aspect of the pancreatic tail.  She states states that she has 1 alcoholic beverage on her birthday each year.  Has never had pancreatitis before.  New events last 24 hours / Subjective: Has an appetite. States fentanyl has been better as far as not making her vomit. Still having upper abdominal pain.   Assessment & Plan:   Principal Problem:   Acute pancreatitis Active Problems:   Type 2 diabetes mellitus with hyperglycemia, with long-term current use of insulin (HCC)   HYPERTRIGLYCERIDEMIA   Essential hypertension   Sleep apnea   Depression   Class 3 obesity (HCC)   Tobacco abuse   Acute pancreatitis -Triglycerides are mildly elevated 216 -Right upper quadrant ultrasound negative -?Medication induced. Takes ozempic, HCTZ, lisinopril, metformin, atorvastatin -Advance to full liquid diet today, continue IV fluids and antiemetic/analgesic  Diabetes mellitus type 2 -Hold metformin -Sliding scale insulin  Hypertension -Hold lisinopril and HCTZ  Depression -Cymbalta  OSA -CPAP nightly  Morbid obesity -BMI 42   DVT prophylaxis: Lovenox   Code Status: Full code Family Communication: No family at bedside Disposition Plan:  Status is: Inpatient Remains inpatient appropriate because: IV  fluid   Antimicrobials:  Anti-infectives (From admission, onward)    None        Objective: Vitals:   06/20/22 1823 06/20/22 2026 06/21/22 0425 06/21/22 1102  BP: (!) 113/47 (!) 134/57 139/74 128/68  Pulse: 62 62 (!) 58 65  Resp: 18 16 18 18   Temp: 97.6 F (36.4 C) 98.7 F (37.1 C) 97.6 F (36.4 C) 97.8 F (36.6 C)  TempSrc: Oral Oral Oral Oral  SpO2: 96% 94% 98% 97%  Weight:      Height:        Intake/Output Summary (Last 24 hours) at 06/21/2022 1158 Last data filed at 06/21/2022 0600 Gross per 24 hour  Intake 3575.36 ml  Output --  Net 3575.36 ml    Filed Weights   06/19/22 0245  Weight: 116.1 kg    Examination:  General exam: Appears calm and comfortable  Respiratory system: Clear to auscultation. Respiratory effort normal. No respiratory distress. No conversational dyspnea.  Cardiovascular system: S1 & S2 heard, RRR. No murmurs. No pedal edema. Gastrointestinal system: Abdomen is nondistended, soft and tender to palpation upper abdomen Central nervous system: Alert and oriented. No focal neurological deficits. Speech clear.  Extremities: Symmetric in appearance  Skin: No rashes, lesions or ulcers on exposed skin  Psychiatry: Judgement and insight appear normal. Mood & affect appropriate.   Data Reviewed: I have personally reviewed following labs and imaging studies  CBC: Recent Labs  Lab 06/19/22 0252 06/20/22 0451 06/21/22 0433  WBC 13.1* 7.5 6.7  HGB 17.4* 15.9* 14.6  HCT 49.6* 48.2* 44.1  MCV 87.8 94.0 93.4  PLT 206 179 156    Basic Metabolic Panel: Recent Labs  Lab 06/19/22 0252 06/20/22 0451 06/21/22 0433  NA 136  142 139  K 3.6 4.0 3.9  CL 99 111 110  CO2 25 25 23   GLUCOSE 212* 85 100*  BUN 24* 18 11  CREATININE 1.16* 0.70 0.66  CALCIUM 9.9 8.6* 8.6*    GFR: Estimated Creatinine Clearance: 109.4 mL/min (by C-G formula based on SCr of 0.66 mg/dL). Liver Function Tests: Recent Labs  Lab 06/19/22 0252 06/20/22 0451  06/21/22 0433  AST 11* 13* 13*  ALT 5 9 9   ALKPHOS 48 47 40  BILITOT 0.6 1.0 0.9  PROT 7.6 7.0 6.9  ALBUMIN 4.3 3.7 3.6    Recent Labs  Lab 06/19/22 0252 06/20/22 0451  LIPASE 2,849* 218*    No results for input(s): "AMMONIA" in the last 168 hours. Coagulation Profile: No results for input(s): "INR", "PROTIME" in the last 168 hours. Cardiac Enzymes: No results for input(s): "CKTOTAL", "CKMB", "CKMBINDEX", "TROPONINI" in the last 168 hours. BNP (last 3 results) No results for input(s): "PROBNP" in the last 8760 hours. HbA1C: No results for input(s): "HGBA1C" in the last 72 hours. CBG: Recent Labs  Lab 06/20/22 1940 06/20/22 2338 06/21/22 0310 06/21/22 0732 06/21/22 1103  GLUCAP 178* 107* 101* 93 188*    Lipid Profile: Recent Labs    06/20/22 0451  CHOL 172  HDL 25*  LDLCALC 104*  TRIG 216*  CHOLHDL 6.9    Thyroid Function Tests: No results for input(s): "TSH", "T4TOTAL", "FREET4", "T3FREE", "THYROIDAB" in the last 72 hours. Anemia Panel: No results for input(s): "VITAMINB12", "FOLATE", "FERRITIN", "TIBC", "IRON", "RETICCTPCT" in the last 72 hours. Sepsis Labs: No results for input(s): "PROCALCITON", "LATICACIDVEN" in the last 168 hours.  No results found for this or any previous visit (from the past 240 hour(s)).    Radiology Studies: 08/21/22 Abdomen Limited RUQ (LIVER/GB)  Result Date: 06/20/2022 CLINICAL DATA:  Pancreatitis, abdominal pain 3 days. EXAM: ULTRASOUND ABDOMEN LIMITED RIGHT UPPER QUADRANT COMPARISON:  CT 06/19/2022 FINDINGS: Gallbladder: No gallstones or wall thickening visualized. No sonographic Murphy sign noted by sonographer. Common bile duct: Diameter: 4 mm. Liver: Difficult evaluation of the liver due to overlying bowel gas and patient body habitus. No focal abnormality. Portal vein is patent on color Doppler imaging with normal direction of blood flow towards the liver. Other: No free fluid. IMPRESSION: No acute hepatobiliary disease.  Electronically Signed   By: 08/20/2022 M.D.   On: 06/20/2022 08:44      Scheduled Meds:  DULoxetine  60 mg Oral q AM   enoxaparin (LOVENOX) injection  60 mg Subcutaneous Q24H   insulin aspart  0-9 Units Subcutaneous TID WC   pantoprazole (PROTONIX) IV  40 mg Intravenous Daily   polyethylene glycol  17 g Oral Daily   Continuous Infusions:  sodium chloride 125 mL/hr at 06/21/22 0844     LOS: 2 days     08/20/2022, DO [facility] 06/21/2022, 11:58 AM   Available via Epic secure chat 7am-7pm After these hours, please refer to coverage provider listed on [email or web address]

## 2022-06-22 DIAGNOSIS — K853 Drug induced acute pancreatitis without necrosis or infection: Secondary | ICD-10-CM | POA: Diagnosis not present

## 2022-06-22 LAB — GLUCOSE, CAPILLARY
Glucose-Capillary: 143 mg/dL — ABNORMAL HIGH (ref 70–99)
Glucose-Capillary: 230 mg/dL — ABNORMAL HIGH (ref 70–99)
Glucose-Capillary: 148 mg/dL — ABNORMAL HIGH (ref 70–99)
Glucose-Capillary: 187 mg/dL — ABNORMAL HIGH (ref 70–99)

## 2022-06-22 MED ORDER — ATORVASTATIN CALCIUM 10 MG PO TABS
10.0000 mg | ORAL_TABLET | Freq: Every day | ORAL | Status: DC
  Administered 2022-06-22 – 2022-06-23 (×2): 10 mg via ORAL
  Filled 2022-06-22 (×2): qty 1

## 2022-06-22 MED ORDER — DOCUSATE SODIUM 100 MG PO CAPS
100.0000 mg | ORAL_CAPSULE | Freq: Two times a day (BID) | ORAL | Status: DC | PRN
  Administered 2022-06-22 – 2022-06-23 (×2): 100 mg via ORAL
  Filled 2022-06-22 (×2): qty 1

## 2022-06-22 MED ORDER — LISINOPRIL 5 MG PO TABS
5.0000 mg | ORAL_TABLET | Freq: Every day | ORAL | Status: DC
  Administered 2022-06-22 – 2022-06-23 (×2): 5 mg via ORAL
  Filled 2022-06-22 (×2): qty 1

## 2022-06-22 MED ORDER — HYDROCHLOROTHIAZIDE 25 MG PO TABS
25.0000 mg | ORAL_TABLET | Freq: Every day | ORAL | Status: DC
  Administered 2022-06-22 – 2022-06-23 (×2): 25 mg via ORAL
  Filled 2022-06-22 (×2): qty 1

## 2022-06-22 NOTE — Progress Notes (Signed)
Mobility Specialist - Progress Note   06/22/22 1157  Mobility  HOB Elevated/Bed Position Self regulated  Activity Ambulated independently in hallway  Range of Motion/Exercises Active  Level of Assistance Independent  Assistive Device None  Distance Ambulated (ft) 350 ft  Activity Response Tolerated well  Transport method Ambulatory  $Mobility charge 1 Mobility   Pt received in bed and agreeable to mobility. Pt is very IND.  Pt to bed after session with all needs met.    [facility]

## 2022-06-22 NOTE — Progress Notes (Signed)
Sterile water added to patients home CPAP to the appropriate level at this time. Patient stated she didn't need assistance with CPAP placement.

## 2022-06-22 NOTE — Progress Notes (Signed)
PROGRESS NOTE    Renee Ho  ESP:[id number] DOB: Jan 11, 1976 DOA: 06/19/2022 PCP: Mayer Masker, PA-C     Brief Narrative:  Renee Ho is a 46 y.o. female with medical history significant of depression, type 2 diabetes, diverticulosis, hyperlipidemia, hypertension, OSA on CPAP, polycystic ovarian syndrome presented to the emergency department with complaints of abdominal pain associated with bloating and nausea since yesterday morning.  Her appetite is decreased.  CT abdomen/pelvis with contrast is showing very subtle peripancreatic edema, mainly around the head and the body of the pancreas with some very subtle edema noted in the anterior aspect of the pancreatic tail.  She states states that she has 1 alcoholic beverage on her birthday each year.  Has never had pancreatitis before.  New events last 24 hours / Subjective: States that her pain is slowly improving, but still having some nausea.  Assessment & Plan:   Principal Problem:   Acute pancreatitis Active Problems:   Type 2 diabetes mellitus with hyperglycemia, with long-term current use of insulin (HCC)   HYPERTRIGLYCERIDEMIA   Essential hypertension   Sleep apnea   Depression   Class 3 obesity (HCC)   Tobacco abuse   Acute pancreatitis -Triglycerides are mildly elevated 216 -Right upper quadrant ultrasound negative -?Medication induced. Takes ozempic, HCTZ, lisinopril, metformin, atorvastatin -Advance to soft diet today and monitor  Diabetes mellitus type 2 -Hold metformin, Farxiga -Sliding scale insulin  Hypertension -HCTZ, lisinopril  Hyperlipidemia -Lipitor  Depression -Cymbalta  OSA -CPAP nightly  Morbid obesity -BMI 42   DVT prophylaxis: Lovenox   Code Status: Full code Family Communication: No family at bedside Disposition Plan:  Status is: Inpatient Remains inpatient appropriate because: Slow clinical improvement.  Hopefully discharge home tomorrow if pain and  nausea improved   Antimicrobials:  Anti-infectives (From admission, onward)    None        Objective: Vitals:   06/21/22 0425 06/21/22 1102 06/21/22 2107 06/22/22 0549  BP: 139/74 128/68 (!) 141/62 130/81  Pulse: (!) 58 65 (!) 54 79  Resp: 18 18 18 18   Temp: 97.6 F (36.4 C) 97.8 F (36.6 C) 98.1 F (36.7 C) 97.7 F (36.5 C)  TempSrc: Oral Oral Oral Oral  SpO2: 98% 97% 97% 98%  Weight:      Height:        Intake/Output Summary (Last 24 hours) at 06/22/2022 1057 Last data filed at 06/22/2022 1000 Gross per 24 hour  Intake 3565.1 ml  Output 0 ml  Net 3565.1 ml    Filed Weights   06/19/22 0245  Weight: 116.1 kg    Examination:  General exam: Appears calm and comfortable  Respiratory system: Clear to auscultation. Respiratory effort normal. No respiratory distress. No conversational dyspnea.  Cardiovascular system: S1 & S2 heard, RRR. No murmurs. No pedal edema. Gastrointestinal system: Abdomen is nondistended, soft and tender to palpation upper abdomen Central nervous system: Alert and oriented. No focal neurological deficits. Speech clear.  Extremities: Symmetric in appearance  Skin: No rashes, lesions or ulcers on exposed skin  Psychiatry: Judgement and insight appear normal. Mood & affect appropriate.   Data Reviewed: I have personally reviewed following labs and imaging studies  CBC: Recent Labs  Lab 06/19/22 0252 06/20/22 0451 06/21/22 0433  WBC 13.1* 7.5 6.7  HGB 17.4* 15.9* 14.6  HCT 49.6* 48.2* 44.1  MCV 87.8 94.0 93.4  PLT 206 179 156    Basic Metabolic Panel: Recent Labs  Lab 06/19/22 0252 06/20/22 0451 06/21/22 0433  NA  136 142 139  K 3.6 4.0 3.9  CL 99 111 110  CO2 25 25 23   GLUCOSE 212* 85 100*  BUN 24* 18 11  CREATININE 1.16* 0.70 0.66  CALCIUM 9.9 8.6* 8.6*    GFR: Estimated Creatinine Clearance: 109.4 mL/min (by C-G formula based on SCr of 0.66 mg/dL). Liver Function Tests: Recent Labs  Lab 06/19/22 0252 06/20/22 0451  06/21/22 0433  AST 11* 13* 13*  ALT 5 9 9   ALKPHOS 48 47 40  BILITOT 0.6 1.0 0.9  PROT 7.6 7.0 6.9  ALBUMIN 4.3 3.7 3.6    Recent Labs  Lab 06/19/22 0252 06/20/22 0451  LIPASE 2,849* 218*    No results for input(s): "AMMONIA" in the last 168 hours. Coagulation Profile: No results for input(s): "INR", "PROTIME" in the last 168 hours. Cardiac Enzymes: No results for input(s): "CKTOTAL", "CKMB", "CKMBINDEX", "TROPONINI" in the last 168 hours. BNP (last 3 results) No results for input(s): "PROBNP" in the last 8760 hours. HbA1C: No results for input(s): "HGBA1C" in the last 72 hours. CBG: Recent Labs  Lab 06/21/22 0732 06/21/22 1103 06/21/22 1613 06/21/22 2116 06/22/22 0734  GLUCAP 93 188* 190* 229* 143*    Lipid Profile: Recent Labs    06/20/22 0451  CHOL 172  HDL 25*  LDLCALC 104*  TRIG 216*  CHOLHDL 6.9    Thyroid Function Tests: No results for input(s): "TSH", "T4TOTAL", "FREET4", "T3FREE", "THYROIDAB" in the last 72 hours. Anemia Panel: No results for input(s): "VITAMINB12", "FOLATE", "FERRITIN", "TIBC", "IRON", "RETICCTPCT" in the last 72 hours. Sepsis Labs: No results for input(s): "PROCALCITON", "LATICACIDVEN" in the last 168 hours.  No results found for this or any previous visit (from the past 240 hour(s)).    Radiology Studies: No results found.    Scheduled Meds:  DULoxetine  60 mg Oral q AM   enoxaparin (LOVENOX) injection  60 mg Subcutaneous Q24H   insulin aspart  0-9 Units Subcutaneous TID WC   pantoprazole (PROTONIX) IV  40 mg Intravenous Daily   polyethylene glycol  17 g Oral Daily   Continuous Infusions:  sodium chloride 125 mL/hr at 06/22/22 0824     LOS: 3 days     08/20/22, DO [facility] 06/22/2022, 10:57 AM   Available via Epic secure chat 7am-7pm After these hours, please refer to coverage provider listed on [email or web address]

## 2022-06-23 ENCOUNTER — Other Ambulatory Visit (HOSPITAL_COMMUNITY): Payer: Self-pay

## 2022-06-23 DIAGNOSIS — K853 Drug induced acute pancreatitis without necrosis or infection: Secondary | ICD-10-CM | POA: Diagnosis not present

## 2022-06-23 LAB — GLUCOSE, CAPILLARY
Glucose-Capillary: 157 mg/dL — ABNORMAL HIGH (ref 70–99)
Glucose-Capillary: 160 mg/dL — ABNORMAL HIGH (ref 70–99)

## 2022-06-23 MED ORDER — OXYCODONE-ACETAMINOPHEN 5-325 MG PO TABS
1.0000 | ORAL_TABLET | ORAL | 0 refills | Status: DC | PRN
  Filled 2022-06-23: qty 30, 3d supply, fill #0

## 2022-06-23 MED ORDER — ACETAMINOPHEN 325 MG PO TABS: 650.0000 mg | ORAL_TABLET | Freq: Four times a day (QID) | ORAL | Status: DC | PRN

## 2022-06-23 MED ORDER — AMLODIPINE BESYLATE 10 MG PO TABS
10.0000 mg | ORAL_TABLET | Freq: Every day | ORAL | 11 refills | Status: DC
  Filled 2022-06-23: qty 30, 30d supply, fill #0

## 2022-06-23 NOTE — TOC Transition Note (Signed)
Transition of Care [facility]) - CM/SW Discharge Note   Patient Details  Name: Keylah Darwish MRN: [id number] Date of Birth: 08-Jun-1976  Transition of Care [facility]) CM/SW Contact:  Golda Acre, RN Phone Number: 06/23/2022, 1:13 PM   Clinical Narrative:      Transition of Care (TOC) Screening Note Dcd to return home with self care.  Patient Details  Name: Ambika Zettlemoyer Date of Birth: 1976-02-10   Transition of Care [facility]) CM/SW Contact:    Golda Acre, RN Phone Number: 06/23/2022, 1:13 PM    Transition of Care Department [facility]) has reviewed patient and no TOC needs have been identified at this time. We will continue to monitor patient advancement through interdisciplinary progression rounds. If new patient transition needs arise, please place a TOC consult.    Final next level of care: Home/Self Care Barriers to Discharge: No Barriers Identified   Patient Goals and CMS Choice        Discharge Placement                       Discharge Plan and Services   Discharge Planning Services: CM Consult                                 Social Determinants of Health (SDOH) Interventions     Readmission Risk Interventions   No data to display

## 2022-06-23 NOTE — Discharge Summary (Signed)
.Physician Discharge Summary  Renee Ho QMG:[id number] DOB: [birth year] DOA: 06/19/2022  PCP: [name], PA-C  Admit date: 06/19/2022 Discharge date: 06/23/2022  Time spent: 36 minutes  Recommendations for Outpatient Follow-up:  Needs Chem-12 CBC in about 1 week Rechallenge with either Ozempic or HCTZ in the outpatient setting-May need insulin short acting in addition  Discharge Diagnoses:  MAIN problem for hospitalization   Acute pancreatitis Uncontrolled DM TY 2 HTN  Please see below for itemized issues addressed in [place]- refer to other progress notes for clarity if needed  Discharge Condition: Improved  Diet recommendation: Soft  Filed Weights   06/19/22 0245  Weight: 116.1 kg    History of present illness:  46 year old Known DM TY 2 diverticulosis hyperlipidemia OSA on CPAP PCOS Admitted 06/19/2022 with pancreatitis [place] Triglycerides are slightly elevated 216 right upper quadrant ultrasound  She was managed conservatively and felt to have stabilized from pain and able to take in p.o. by 06/22/2022 It was felt that her pancreatitis may have been caused by either her Ozempic or may be even her HCTZ-both been discontinued We called in Norvasc to her pharmacy she has been instructed to take either Tylenol or ibuprofen instead for moderate pain and oxycodone for severe pain prescription of which was given She was stable for discharge and met maximal benefit  She knows she should follow-up in the outpatient setting with PCP   Discharge Exam: Vitals:   06/22/22 2124 06/23/22 0518  BP: (!) 148/69 133/63  Pulse: (!) 57 60  Resp: 17 17  Temp: 98.2 F (36.8 C) (!) 97.5 F (36.4 C)  SpO2: 99% 96%    Subj on day of d/c   Awake coherent pleasant mild pain some nausea  General Exam on discharge  EOMI NCAT thick neck Mallampati 4 No icterus no pallor Chest clear no added sound rales rhonchi Abdomen soft no rebound no guarding ROM  intact No lower extremity edema Multiple tattoos  Discharge Instructions   Discharge Instructions     Diet - low sodium heart healthy   Complete by: As directed    Discharge instructions   Complete by: As directed    Hold off on your Ozempic and hydrochlorothiazide for now-ER physician may choose to rechallenge you with these 1 at a time in the outpatient setting-for blood pressure I would use Norvasc 10 which have called into your pharmacy For your pain in your stomach because you have pancreatitis we have prescribed a limited prescription of oxycodone-you may use ibuprofen or Tylenol first choice for pain-keep in mind that ibuprofen may cause irritation of your stomach Go very slowly on the diet Report severe pain or come to the ED if you are unable to control any of the pain at home-please make sure that you take it slow on the diet  Follow-up with your physician in about 1 week's time for hospital follow-up-call them to schedule.   Increase activity slowly   Complete by: As directed       Allergies as of 06/23/2022       Reactions   Morphine And Related Other (See Comments)   Migraines   Reglan [metoclopramide] Other (See Comments)   Psychotic episodes   Chantix [varenicline] Other (See Comments)   Makes the patient angry feeling   Toradol [ketorolac Tromethamine] Rash        Medication List     STOP taking these medications    atorvastatin 10 MG tablet Commonly known as: LIPITOR   hydrochlorothiazide  25 MG tablet Commonly known as: HYDRODIURIL   Ozempic (1 MG/DOSE) 4 MG/3ML Sopn Generic drug: Semaglutide (1 MG/DOSE)       TAKE these medications    acetaminophen 325 MG tablet Commonly known as: TYLENOL Take 2 tablets (650 mg total) by mouth every 6 (six) hours as needed for mild pain (or Fever >/= 101).   amLODipine 10 MG tablet Commonly known as: NORVASC Take 1 tablet (10 mg total) by mouth daily.   DULoxetine 60 MG capsule Commonly known as:  CYMBALTA Take 1 capsule (60 mg total) by mouth daily. What changed: when to take this   Farxiga 10 MG Tabs tablet Generic drug: dapagliflozin propanediol Take 1 tablet (10 mg total) by mouth daily.   ibuprofen 200 MG tablet Commonly known as: ADVIL Take 800 mg by mouth every 8 (eight) hours as needed for headache or moderate pain.   lisinopril 5 MG tablet Commonly known as: ZESTRIL Take 1 tablet (5 mg total) by mouth daily.   metFORMIN 1000 MG tablet Commonly known as: GLUCOPHAGE Take 1 tablet (1,000 mg total) by mouth 2 (two) times daily with a meal. What changed: when to take this   oxyCODONE-acetaminophen 5-325 MG tablet Commonly known as: PERCOCET/ROXICET Take 1-2 tablets by mouth every 4 (four) hours as needed for moderate pain.   PRESCRIPTION MEDICATION See admin instructions. CPAP- CONTINUOUS   Semglee (yfgn) 100 UNIT/ML Pen Generic drug: insulin glargine-yfgn Inject 100 Units into the skin at bedtime.   Unifine Pentips 31G X 8 MM Misc Generic drug: Insulin Pen Needle Use as directed   Vitamin D (Ergocalciferol) 1.25 MG (50000 UNIT) Caps capsule Commonly known as: DRISDOL Take 1 capsule (50,000 Units total) by mouth every 7 (seven) days. What changed: when to take this       Allergies  Allergen Reactions   Morphine And Related Other (See Comments)    Migraines   Reglan [Metoclopramide] Other (See Comments)    Psychotic episodes   Chantix [Varenicline] Other (See Comments)    Makes the patient angry feeling   Toradol [Ketorolac Tromethamine] Rash      The results of significant diagnostics from this hospitalization (including imaging, microbiology, ancillary and laboratory) are listed below for reference.    Significant Diagnostic Studies: US Abdomen Limited RUQ (LIVER/GB)  Result Date: 06/20/2022 CLINICAL DATA:  Pancreatitis, abdominal pain 3 days. EXAM: ULTRASOUND ABDOMEN LIMITED RIGHT UPPER QUADRANT COMPARISON:  CT 06/19/2022 FINDINGS:  Gallbladder: No gallstones or wall thickening visualized. No sonographic Murphy sign noted by sonographer. Common bile duct: Diameter: 4 mm. Liver: Difficult evaluation of the liver due to overlying bowel gas and patient body habitus. No focal abnormality. Portal vein is patent on color Doppler imaging with normal direction of blood flow towards the liver. Other: No free fluid. IMPRESSION: No acute hepatobiliary disease. Electronically Signed   By: Marin Olp M.D.   On: 06/20/2022 08:44   CT ABDOMEN PELVIS W CONTRAST  Result Date: 06/19/2022 CLINICAL DATA:  Abdominal pain with nausea and bloating. EXAM: CT ABDOMEN AND PELVIS WITH CONTRAST TECHNIQUE: Multidetector CT imaging of the abdomen and pelvis was performed using the standard protocol following bolus administration of intravenous contrast. RADIATION DOSE REDUCTION: This exam was performed according to the departmental dose-optimization program which includes automated exposure control, adjustment of the mA and/or kV according to patient size and/or use of iterative reconstruction technique. CONTRAST:  177m OMNIPAQUE IOHEXOL 300 MG/ML  SOLN COMPARISON:  07/11/2013 FINDINGS: Lower chest: Unremarkable. Hepatobiliary: No suspicious focal abnormality  within the liver parenchyma. There is no evidence for gallstones, gallbladder wall thickening, or pericholecystic fluid. No intrahepatic or extrahepatic biliary dilation. Pancreas: There is some very subtle peripancreatic edema, mainly around the head and body of pancreas with some very subtle edema noted along the anterior aspect of the pancreatic tail. No main duct dilatation. No underlying focal pancreatic mass lesion. Spleen: No splenomegaly. No focal mass lesion. Adrenals/Urinary Tract: No adrenal nodule or mass. 14 mm well-defined homogeneous low-density exophytic lesion lower pole right kidney is compatible with a tiny cyst. Left kidney unremarkable. No evidence for hydroureter. The urinary bladder  appears normal for the degree of distention. Stomach/Bowel: Stomach is moderately distended with food and fluid. Duodenum is normally positioned as is the ligament of Treitz. No small bowel wall thickening. No small bowel dilatation. The terminal ileum is normal. The appendix is normal. No gross colonic mass. No colonic wall thickening. Vascular/Lymphatic: There is mild atherosclerotic calcification of the abdominal aorta without aneurysm. Upper normal lymph nodes are seen along the celiac axis and in the hepatoduodenal ligament. No retroperitoneal or mesenteric lymphadenopathy. No pelvic sidewall lymphadenopathy. Reproductive: The uterus is surgically absent. Prominent left ovary is unchanged since 2014 consistent with benign features. No right adnexal mass. Other: No intraperitoneal free fluid. Musculoskeletal: No worrisome lytic or sclerotic osseous abnormality. IMPRESSION: 1. Very subtle peripancreatic edema, mainly around the head and body of pancreas with some very subtle edema noted along the anterior aspect of the pancreatic tail. Imaging features are compatible with acute pancreatitis. 2. Otherwise no acute findings in the abdomen or pelvis. 3. Aortic Atherosclerosis (ICD10-I70.0). Electronically Signed   By: Misty Stanley M.D.   On: 06/19/2022 05:44    Microbiology: No results found for this or any previous visit (from the past 240 hour(s)).   Labs: Basic Metabolic Panel: Recent Labs  Lab 06/19/22 0252 06/20/22 0451 06/21/22 0433  NA 136 142 139  K 3.6 4.0 3.9  CL 99 111 110  CO2 _0 GLUCOSE 212* 85 100*  BUN 24* 18 11  CREATININE 1.16* 0.70 0.66  CALCIUM 9.9 8.6* 8.6*   Liver Function Tests: Recent Labs  Lab 06/19/22 0252 06/20/22 0451 06/21/22 0433  AST 11* 13* 13*  ALT _1 ALKPHOS 48 47 40  BILITOT 0.6 1.0 0.9  PROT 7.6 7.0 6.9  ALBUMIN 4.3 3.7 3.6   Recent Labs  Lab 06/19/22 0252 06/20/22 0451  LIPASE 2,849* 218*   No results for input(s): "AMMONIA" in  the last 168 hours. CBC: Recent Labs  Lab 06/19/22 0252 06/20/22 0451 06/21/22 0433  WBC 13.1* 7.5 6.7  HGB 17.4* 15.9* 14.6  HCT 49.6* 48.2* 44.1  MCV 87.8 94.0 93.4  PLT 206 179 156   Cardiac Enzymes: No results for input(s): "CKTOTAL", "CKMB", "CKMBINDEX", "TROPONINI" in the last 168 hours. BNP: BNP (last 3 results) No results for input(s): "BNP" in the last 8760 hours.  ProBNP (last 3 results) No results for input(s): "PROBNP" in the last 8760 hours.  CBG: Recent Labs  Lab 06/22/22 1150 06/22/22 1633 06/22/22 2127 06/23/22 0700 06/23/22 1146  GLUCAP 230* 148* 187* 157* 160*       Signed:  Nita Sells MD   [facility] 06/23/2022, 12:48 PM

## 2022-06-27 ENCOUNTER — Emergency Department (HOSPITAL_BASED_OUTPATIENT_CLINIC_OR_DEPARTMENT_OTHER)
Admission: EM | Admit: 2022-06-27 | Discharge: 2022-06-27 | Disposition: A | Discharge status: Home / Self Care | Payer: No Typology Code available for payment source | Attending: Emergency Medicine | Admitting: Emergency Medicine

## 2022-06-27 ENCOUNTER — Other Ambulatory Visit: Payer: Self-pay

## 2022-06-27 DIAGNOSIS — Z794 Long term (current) use of insulin: Secondary | ICD-10-CM | POA: Diagnosis not present

## 2022-06-27 DIAGNOSIS — Z7984 Long term (current) use of oral hypoglycemic drugs: Secondary | ICD-10-CM | POA: Diagnosis not present

## 2022-06-27 DIAGNOSIS — I10 Essential (primary) hypertension: Secondary | ICD-10-CM | POA: Diagnosis not present

## 2022-06-27 DIAGNOSIS — R1013 Epigastric pain: Secondary | ICD-10-CM | POA: Insufficient documentation

## 2022-06-27 DIAGNOSIS — E1165 Type 2 diabetes mellitus with hyperglycemia: Secondary | ICD-10-CM | POA: Diagnosis not present

## 2022-06-27 DIAGNOSIS — R600 Localized edema: Secondary | ICD-10-CM | POA: Diagnosis not present

## 2022-06-27 DIAGNOSIS — Z79899 Other long term (current) drug therapy: Secondary | ICD-10-CM | POA: Insufficient documentation

## 2022-06-27 DIAGNOSIS — R1033 Periumbilical pain: Secondary | ICD-10-CM | POA: Diagnosis not present

## 2022-06-27 DIAGNOSIS — M7989 Other specified soft tissue disorders: Secondary | ICD-10-CM | POA: Diagnosis present

## 2022-06-27 DIAGNOSIS — R609 Edema, unspecified: Secondary | ICD-10-CM

## 2022-06-27 DIAGNOSIS — R109 Unspecified abdominal pain: Secondary | ICD-10-CM

## 2022-06-27 LAB — URINALYSIS, ROUTINE W REFLEX MICROSCOPIC
Bilirubin Urine: NEGATIVE
Glucose, UA: >1000 mg/dL — AB
Hgb urine dipstick: NEGATIVE
Ketones, ur: NEGATIVE mg/dL
Leukocytes,Ua: NEGATIVE
Nitrite: NEGATIVE
Protein, ur: NEGATIVE mg/dL
Specific Gravity, Urine: 1.022 (ref 1.005–1.030)
pH: 6 (ref 5.0–8.0)

## 2022-06-27 LAB — COMPREHENSIVE METABOLIC PANEL
ALT: 8 U/L (ref 0–44)
AST: 13 U/L — ABNORMAL LOW (ref 15–41)
Albumin: 4 g/dL (ref 3.5–5.0)
Alkaline Phosphatase: 46 U/L (ref 38–126)
Anion gap: 9 (ref 5–15)
BUN: 16 mg/dL (ref 6–20)
CO2: 25 mmol/L (ref 22–32)
Calcium: 9.3 mg/dL (ref 8.9–10.3)
Chloride: 104 mmol/L (ref 98–111)
Creatinine, Ser: 0.85 mg/dL (ref 0.44–1.00)
GFR, Estimated: >60 mL/min (ref >60)
Glucose, Bld: 344 mg/dL — ABNORMAL HIGH (ref 70–99)
Potassium: 4 mmol/L (ref 3.5–5.1)
Sodium: 138 mmol/L (ref 135–145)
Total Bilirubin: 0.4 mg/dL (ref 0.3–1.2)
Total Protein: 6.7 g/dL (ref 6.5–8.1)

## 2022-06-27 LAB — CBC WITH DIFFERENTIAL/PLATELET
Abs Immature Granulocytes: 0.04 10*3/uL (ref 0.00–0.07)
Basophils Absolute: 0.1 10*3/uL (ref 0.0–0.1)
Basophils Relative: 1 %
Eosinophils Absolute: 0.2 10*3/uL (ref 0.0–0.5)
Eosinophils Relative: 3 %
HCT: 44.4 % (ref 36.0–46.0)
Hemoglobin: 15.2 g/dL — ABNORMAL HIGH (ref 12.0–15.0)
Immature Granulocytes: 0 %
Lymphocytes Relative: 23 %
Lymphs Abs: 2 10*3/uL (ref 0.7–4.0)
MCH: 30.8 pg (ref 26.0–34.0)
MCHC: 34.2 g/dL (ref 30.0–36.0)
MCV: 90.1 fL (ref 80.0–100.0)
Monocytes Absolute: 0.7 10*3/uL (ref 0.1–1.0)
Monocytes Relative: 8 %
Neutro Abs: 5.8 10*3/uL (ref 1.7–7.7)
Neutrophils Relative %: 65 %
Platelets: 197 10*3/uL (ref 150–400)
RBC: 4.93 MIL/uL (ref 3.87–5.11)
RDW: 13.5 % (ref 11.5–15.5)
WBC: 8.9 10*3/uL (ref 4.0–10.5)
nRBC: 0 % (ref 0.0–0.2)

## 2022-06-27 LAB — BRAIN NATRIURETIC PEPTIDE: B Natriuretic Peptide: 31.6 pg/mL (ref 0.0–100.0)

## 2022-06-27 LAB — PREGNANCY, URINE: Preg Test, Ur: NEGATIVE

## 2022-06-27 LAB — LIPASE, BLOOD: Lipase: 137 U/L — ABNORMAL HIGH (ref 11–51)

## 2022-06-27 MED ORDER — FUROSEMIDE 20 MG PO TABS: 20.0000 mg | ORAL_TABLET | Freq: Every day | ORAL | 0 refills | Status: DC

## 2022-06-27 MED ORDER — LIDOCAINE VISCOUS HCL 2 % MT SOLN
15.0000 mL | Freq: Once | OROMUCOSAL | Status: AC
  Administered 2022-06-27: 15 mL via ORAL
  Filled 2022-06-27: qty 15

## 2022-06-27 MED ORDER — FUROSEMIDE 10 MG/ML IJ SOLN
20.0000 mg | Freq: Once | INTRAMUSCULAR | Status: AC
  Administered 2022-06-27: 20 mg via INTRAVENOUS
  Filled 2022-06-27: qty 2

## 2022-06-27 MED ORDER — FENTANYL CITRATE PF 50 MCG/ML IJ SOSY
50.0000 ug | PREFILLED_SYRINGE | Freq: Once | INTRAMUSCULAR | Status: AC
  Administered 2022-06-27: 50 ug via INTRAVENOUS
  Filled 2022-06-27: qty 1

## 2022-06-27 MED ORDER — ONDANSETRON HCL 4 MG/2ML IJ SOLN
4.0000 mg | Freq: Once | INTRAMUSCULAR | Status: AC
  Administered 2022-06-27: 4 mg via INTRAVENOUS
  Filled 2022-06-27: qty 2

## 2022-06-27 MED ORDER — ALUM & MAG HYDROXIDE-SIMETH 200-200-20 MG/5ML PO SUSP
30.0000 mL | Freq: Once | ORAL | Status: AC
  Administered 2022-06-27: 30 mL via ORAL
  Filled 2022-06-27: qty 30

## 2022-06-27 NOTE — ED Triage Notes (Signed)
POV, pt sts that yesterday morning she began having bilateral lower extremity swelling and pain, about an hour ago bilateral hands started swelling. Pt was recently released from hospital on Wednesday for pancreatitis and sts nausea and pain are starting to come back. Ambulatory, alert and oriented x 4, oxycodone 5mg  given after hospital stay and sts she hasn't taken them since Friday.

## 2022-06-27 NOTE — ED Provider Notes (Signed)
[facility] EMERGENCY DEPT Provider Note  CSN: [id number] Arrival date & time: 06/27/22 0146  Chief Complaint(s) Leg Swelling  HPI Renee Ho is a 46 y.o. female with a past medical history listed below including hypertension, hyperlipidemia and diabetes who was recently admitted for drug-induced acute pancreatitis (thought to be related to HCTZ or Ozempic) who presents to the emergency department with 2 days of bilateral lower extremity swelling and pain.  She denies any redness.  No fall or trauma.  Patient also noticing swelling in bilateral hands.  Additionally reports that she started having abdominal discomfort after eating baked potato with butter earlier this evening.  HPI  Past Medical History Past Medical History:  Diagnosis Date   Depression    Diabetes mellitus    Diabetes mellitus without complication ([place])    Diverticula of intestine    High cholesterol    Hypertension    OSA on CPAP    CPAP pressure= 15   Polycystic ovarian syndrome    Sleep apnea    on CPAP with pressure setting= 15   Patient Active Problem List   Diagnosis Date Noted   Acute pancreatitis 06/19/2022   Class 3 obesity ([place]) 06/19/2022   Tobacco abuse 06/19/2022   Hyperlipidemia associated with type 2 diabetes mellitus ([place]) 04/14/2020   Lumbar radiculopathy 08/09/2019   Hyperglycemia 12/22/2016   Injury of foot, left 07/18/2013   Hypertension associated with diabetes ([place]) 09/18/2012   Morbid obesity ([place]) 09/18/2012   S/P hysterectomy 06/20/2012   H/O colonoscopy with polypectomy 06/20/2012   Depression 12/30/2011   Plantar fasciitis 04/22/2011   HYPERTRIGLYCERIDEMIA 02/21/2008   Type 2 diabetes mellitus with hyperglycemia, with long-term current use of insulin ([place]) 02/24/2007   POLYCYSTIC OVARIAN DISEASE 02/24/2007   Essential hypertension 02/24/2007   Sleep apnea 02/24/2007   Home Medication(s) Prior to Admission medications   Medication Sig Start  Date End Date Taking? Authorizing Provider  furosemide (LASIX) 20 MG tablet Take 1 tablet (20 mg total) by mouth daily for 3 days. 06/27/22 06/30/22 Yes Lionardo Haze, Grayce Sessions, MD  acetaminophen (TYLENOL) 325 MG tablet Take 2 tablets (650 mg total) by mouth every 6 (six) hours as needed for mild pain (or Fever >/= 101). 06/23/22   Nita Sells, MD  amLODipine (NORVASC) 10 MG tablet Take 1 tablet (10 mg total) by mouth daily. 06/23/22   Nita Sells, MD  dapagliflozin propanediol (FARXIGA) 10 MG TABS tablet Take 1 tablet (10 mg total) by mouth daily. 02/24/22   [name], PA-C  DULoxetine (CYMBALTA) 60 MG capsule Take 1 capsule (60 mg total) by mouth daily. Patient taking differently: Take 60 mg by mouth in the morning. 02/24/22   [name], PA-C  ibuprofen (ADVIL) 200 MG tablet Take 800 mg by mouth every 8 (eight) hours as needed for headache or moderate pain.    [provider]  insulin glargine-yfgn (SEMGLEE, YFGN,) 100 UNIT/ML Pen Inject 100 Units into the skin at bedtime. 02/24/22   [name], PA-C  Insulin Pen Needle 31G X 8 MM MISC Use as directed 02/24/22   Abonza, Maritza, PA-C  lisinopril (ZESTRIL) 5 MG tablet Take 1 tablet (5 mg total) by mouth daily. 02/24/22   [name], PA-C  metFORMIN (GLUCOPHAGE) 1000 MG tablet Take 1 tablet (1,000 mg total) by mouth 2 (two) times daily with a meal. Patient taking differently: Take 1,000 mg by mouth in the morning and at bedtime. 02/24/22   [name], PA-C  oxyCODONE-acetaminophen (PERCOCET/ROXICET) 5-325 MG tablet  Take 1-2 tablets by mouth every 4 (four) hours as needed for moderate pain. 06/23/22   Rhetta MuraSamtani, Jai-Gurmukh, MD  PRESCRIPTION MEDICATION See admin instructions. CPAP- CONTINUOUS    [provider]  Vitamin D, Ergocalciferol, (DRISDOL) 1.25 MG (50000 UNIT) CAPS capsule Take 1 capsule (50,000 Units total) by mouth every 7 (seven) days. Patient taking differently: Take 50,000 Units by mouth  every Sunday. 02/24/22   Mayer MaskerAbonza, Maritza, PA-C                                                                                                                                    Allergies Morphine and related, Reglan [metoclopramide], Chantix [varenicline], and Toradol [ketorolac tromethamine]  Review of Systems Review of Systems As noted in HPI  Physical Exam Vital Signs  I have reviewed the triage vital signs BP (!) 145/124   Pulse 70   Temp 97.9 F (36.6 C) (Oral)   Resp 16   Wt 116 kg   SpO2 96%   BMI 42.56 kg/m   Physical Exam Vitals reviewed.  Constitutional:      General: She is not in acute distress.    Appearance: She is well-developed. She is not diaphoretic.  HENT:     Head: Normocephalic and atraumatic.     Right Ear: External ear normal.     Left Ear: External ear normal.     Nose: Nose normal.  Eyes:     General: No scleral icterus.    Conjunctiva/sclera: Conjunctivae normal.  Neck:     Trachea: Phonation normal.  Cardiovascular:     Rate and Rhythm: Normal rate and regular rhythm.  Pulmonary:     Effort: Pulmonary effort is normal. No respiratory distress.     Breath sounds: No stridor.  Abdominal:     General: There is no distension.     Tenderness: There is abdominal tenderness in the epigastric area and periumbilical area. There is no guarding or rebound.  Musculoskeletal:        General: Normal range of motion.     Cervical back: Normal range of motion.     Right lower leg: 1+ Pitting Edema present.     Left lower leg: 1+ Pitting Edema present.  Neurological:     Mental Status: She is alert and oriented to person, place, and time.  Psychiatric:        Behavior: Behavior normal.     ED Results and Treatments Labs (all labs ordered are listed, but only abnormal results are displayed) Labs Reviewed  COMPREHENSIVE METABOLIC PANEL - Abnormal; Notable for the following components:      Result Value   Glucose, Bld 344 (*)    AST 13 (*)     All other components within normal limits  LIPASE, BLOOD - Abnormal; Notable for the following components:   Lipase 137 (*)    All other components within normal limits  CBC WITH DIFFERENTIAL/PLATELET -  Abnormal; Notable for the following components:   Hemoglobin 15.2 (*)    All other components within normal limits  URINALYSIS, ROUTINE W REFLEX MICROSCOPIC - Abnormal; Notable for the following components:   Color, Urine COLORLESS (*)    Glucose, UA >1,000 (*)    All other components within normal limits  PREGNANCY, URINE  BRAIN NATRIURETIC PEPTIDE                                                                                                                         EKG  EKG Interpretation  Date/Time:    Ventricular Rate:    PR Interval:    QRS Duration:   QT Interval:    QTC Calculation:   R Axis:     Text Interpretation:         Radiology No results found.  Medications Ordered in ED Medications  ondansetron (ZOFRAN) injection 4 mg (4 mg Intravenous Given 06/27/22 0333)  fentaNYL (SUBLIMAZE) injection 50 mcg (50 mcg Intravenous Given 06/27/22 0333)  furosemide (LASIX) injection 20 mg (20 mg Intravenous Given 06/27/22 0410)  alum & mag hydroxide-simeth (MAALOX/MYLANTA) 200-200-20 MG/5ML suspension 30 mL (30 mLs Oral Given 06/27/22 0445)    And  lidocaine (XYLOCAINE) 2 % viscous mouth solution 15 mL (15 mLs Oral Given 06/27/22 0445)                                                                                                                                     Procedures Procedures  (including critical care time)  Medical Decision Making / ED Course   Medical Decision Making Amount and/or Complexity of Data Reviewed External Data Reviewed: notes.    Details: Admission note Labs: ordered. Decision-making details documented in ED Course. Radiology: ordered and independent interpretation performed. Decision-making details documented in ED Course.  Risk OTC  drugs. Prescription drug management. Parenteral controlled substances. Decision regarding hospitalization.   Peripheral edema: During hospitalization patient received high-volume IV fluids.  This is likely third spacing from all her IV fluids.  We will obtain labs to assess for any evidence of renal sufficiency for possible new onset heart failure have low suspicion for the latter. We will also reassess pancreatic status.  CBC without leukocytosis or anemia Metabolic panel without significant electrolyte derangements or renal sufficiency. She has hyperglycemia without evidence of DKA. No evidence of bili obstruction. Lipase 130 which is downtrending from  discharge. BNP negative. UA negative for infection. UPT obtained in case patient required CT scan.  Patient provided with low-dose IV Lasix to assist in diuresis. Patient is tolerating p.o.. Feel she is appropriate for continued outpatient management. She has scheduled follow-up with her PCP in 3 days.. We will prescribe 3 days worth of Lasix.       Final Clinical Impression(s) / ED Diagnoses Final diagnoses:  Peripheral edema  Abdominal discomfort   The patient appears reasonably screened and/or stabilized for discharge and I doubt any other medical condition or other [place] requiring further screening, evaluation, or treatment in the ED at this time. I have discussed the findings, Dx and Tx plan with the patient/family who expressed understanding and agree(s) with the plan. Discharge instructions discussed at length. The patient/family was given strict return precautions who verbalized understanding of the instructions. No further questions at time of discharge.  Disposition: Discharge  Condition: Good  ED Discharge Orders          Ordered    furosemide (LASIX) 20 MG tablet  Daily        06/27/22 0512            Follow Up: Mayer Masker, PA-C [address] [place] [address] [phone]  On  06/30/2022 as scheduled           This chart was dictated using voice recognition software.  Despite best efforts to proofread,  errors can occur which can change the documentation meaning.    Nira Conn, MD 06/27/22 [phone]

## 2022-06-27 NOTE — ED Notes (Signed)
Discharge instructions discussed with pt. Pt verbalized understanding. Pt stable and ambulatory.  °

## 2022-06-30 ENCOUNTER — Encounter: Payer: Self-pay | Admitting: Physician Assistant

## 2022-06-30 ENCOUNTER — Other Ambulatory Visit (HOSPITAL_COMMUNITY): Payer: Self-pay

## 2022-06-30 ENCOUNTER — Ambulatory Visit (INDEPENDENT_AMBULATORY_CARE_PROVIDER_SITE_OTHER): Payer: No Typology Code available for payment source | Admitting: Physician Assistant

## 2022-06-30 VITALS — BP 136/81 | HR 78 | Temp 98.5°F | Ht 65.0 in | Wt 262.0 lb

## 2022-06-30 DIAGNOSIS — E1165 Type 2 diabetes mellitus with hyperglycemia: Secondary | ICD-10-CM

## 2022-06-30 DIAGNOSIS — E1169 Type 2 diabetes mellitus with other specified complication: Secondary | ICD-10-CM

## 2022-06-30 DIAGNOSIS — Z794 Long term (current) use of insulin: Secondary | ICD-10-CM

## 2022-06-30 DIAGNOSIS — E785 Hyperlipidemia, unspecified: Secondary | ICD-10-CM

## 2022-06-30 DIAGNOSIS — E1159 Type 2 diabetes mellitus with other circulatory complications: Secondary | ICD-10-CM | POA: Diagnosis not present

## 2022-06-30 DIAGNOSIS — R609 Edema, unspecified: Secondary | ICD-10-CM | POA: Diagnosis not present

## 2022-06-30 DIAGNOSIS — F32A Depression, unspecified: Secondary | ICD-10-CM | POA: Diagnosis not present

## 2022-06-30 DIAGNOSIS — K859 Acute pancreatitis without necrosis or infection, unspecified: Secondary | ICD-10-CM | POA: Diagnosis not present

## 2022-06-30 DIAGNOSIS — I152 Hypertension secondary to endocrine disorders: Secondary | ICD-10-CM

## 2022-06-30 LAB — POCT GLYCOSYLATED HEMOGLOBIN (HGB A1C): Hemoglobin A1C: 7.4 % — AB (ref 4.0–5.6)

## 2022-06-30 MED ORDER — ATORVASTATIN CALCIUM 10 MG PO TABS
10.0000 mg | ORAL_TABLET | Freq: Every day | ORAL | 1 refills | Status: DC
  Filled 2022-06-30 – 2022-09-27 (×2): qty 90, 90d supply, fill #0
  Filled 2023-03-18: qty 90, 90d supply, fill #1

## 2022-06-30 MED ORDER — TIRZEPATIDE 2.5 MG/0.5ML ~~LOC~~ SOAJ
2.5000 mg | SUBCUTANEOUS | 0 refills | Status: DC
  Filled 2022-06-30: qty 2, 28d supply, fill #0

## 2022-06-30 MED ORDER — FUROSEMIDE 10 MG/ML IJ SOLN
20.0000 mg | Freq: Once | INTRAMUSCULAR | Status: AC
  Administered 2022-06-30: 20 mg via INTRAMUSCULAR

## 2022-06-30 MED ORDER — METFORMIN HCL 1000 MG PO TABS
1000.0000 mg | ORAL_TABLET | Freq: Two times a day (BID) | ORAL | 1 refills | Status: DC
  Filled 2022-06-30 – 2022-09-27 (×2): qty 180, 90d supply, fill #0
  Filled 2023-03-18: qty 180, 90d supply, fill #1

## 2022-06-30 MED ORDER — HYDROCHLOROTHIAZIDE 25 MG PO TABS
25.0000 mg | ORAL_TABLET | Freq: Every day | ORAL | 2 refills | Status: DC
  Filled 2022-06-30 – 2022-09-27 (×2): qty 90, 90d supply, fill #0
  Filled 2023-03-18: qty 90, 90d supply, fill #1

## 2022-06-30 MED ORDER — FUROSEMIDE 20 MG PO TABS
20.0000 mg | ORAL_TABLET | Freq: Every day | ORAL | 0 refills | Status: DC
  Filled 2022-06-30: qty 5, 5d supply, fill #0

## 2022-06-30 MED ORDER — LISINOPRIL 5 MG PO TABS
5.0000 mg | ORAL_TABLET | Freq: Every day | ORAL | 1 refills | Status: DC
  Filled 2022-06-30 – 2022-09-27 (×2): qty 90, 90d supply, fill #0
  Filled 2023-03-18: qty 90, 90d supply, fill #1

## 2022-06-30 MED ORDER — INSULIN GLARGINE-YFGN 100 UNIT/ML ~~LOC~~ SOPN
100.0000 [IU] | PEN_INJECTOR | Freq: Every day | SUBCUTANEOUS | 2 refills | Status: DC
  Filled 2022-06-30: qty 90, 90d supply, fill #0
  Filled 2022-09-27: qty 90, 90d supply, fill #1
  Filled 2023-03-18: qty 90, 90d supply, fill #2

## 2022-06-30 MED ORDER — DULOXETINE HCL 60 MG PO CPEP
60.0000 mg | ORAL_CAPSULE | Freq: Every day | ORAL | 1 refills | Status: DC
  Filled 2022-06-30 – 2022-09-27 (×2): qty 90, 90d supply, fill #0
  Filled 2023-03-18: qty 90, 90d supply, fill #1

## 2022-06-30 MED ORDER — DAPAGLIFLOZIN PROPANEDIOL 10 MG PO TABS
10.0000 mg | ORAL_TABLET | Freq: Every day | ORAL | 1 refills | Status: DC
  Filled 2022-06-30 – 2022-09-27 (×2): qty 90, 90d supply, fill #0
  Filled 2023-03-18: qty 90, 90d supply, fill #1

## 2022-06-30 MED ORDER — TIRZEPATIDE 5 MG/0.5ML ~~LOC~~ SOAJ
5.0000 mg | SUBCUTANEOUS | 2 refills | Status: DC
  Filled 2022-06-30 – 2022-07-30 (×2): qty 6, 84d supply, fill #0
  Filled 2022-10-24: qty 2, 28d supply, fill #1
  Filled 2023-03-18: qty 2, 28d supply, fill #2

## 2022-06-30 MED ORDER — KETOROLAC TROMETHAMINE 60 MG/2ML IM SOLN
60.0000 mg | Freq: Once | INTRAMUSCULAR | Status: AC
  Administered 2022-06-30: 60 mg via INTRAMUSCULAR

## 2022-06-30 NOTE — Assessment & Plan Note (Signed)
-  Given patient's existing co-morbidities and 10-year risk score of 20.4% benefits outweigh risks of statin therapy so will resume statin therapy with atorvastatin 10 mg daily. If patient were to develop another episode of pancreatitis then will consider alternatives. The 10-year ASCVD risk score (Arnett DK, et al., 2019) is: 20.4%

## 2022-06-30 NOTE — Assessment & Plan Note (Signed)
-  Acute illness contributing to mood. No SI/HI. Will continue current medication regimen and monitor for consistent worsening symptoms.

## 2022-06-30 NOTE — Assessment & Plan Note (Signed)
-  Reviewed recent hospital notes, labs and imaging. Discussed with patient likely Ozempic contributed. Will administer ketorolac 60 mg in office to help with pain since ibuprofen not helping. Patient has a mild/low allergy to medication and has tolerated ibuprofen which is also an anti-inflammatory.

## 2022-06-30 NOTE — Progress Notes (Signed)
Established patient visit   Patient: Renee Ho   DOB: 1976/01/19   46 y.o. Female  MRN: [id number] Visit Date: 06/30/2022  Chief Complaint  Patient presents with   Follow-up   Subjective    HPI  Patient presents for chronic follow-up visit. Patient was hospitalized 06/19/2022 for acute pancreatitis though related to medication and recently went to ED for lower extremity swelling. States has been drinking lots of water. States continues to have swelling all over her body. States her husband lost her rx for Lasix 20 mg so she has not taken the medication. Also reports having trouble with ambulation due to swelling and pain in her joints, has been taking Ibuprofen which has provided minimal relief. No chest pain or shortness of breath.  Diabetes: Pt denies increased urination or thirst. Pt reports stopped all her medications until her visit today. No hypoglycemic events.   HTN: Pt denies chest pain, palpitations, dizziness or syncope Has not taken her blood pressure medications because was concerned about new BP medication prescribed by the hospital (amlodipine). Patient has been on HCTZ for more than 10 years but with her recent acute pancreatitis medication was discontinued because it could have caused pancreatitis.  HLD: Pt  has not been taking statin medication and wanted to discuss today at her visit.      06/30/2022   10:45 AM 02/24/2022   10:23 AM 08/25/2021   10:44 AM 04/21/2021    2:34 PM 03/03/2021    8:07 AM  Depression screen PHQ 2/9  Decreased Interest 1 0 0 1 1  Down, Depressed, Hopeless 1 0 0 1 1  PHQ - 2 Score 2 0 0 2 2  Altered sleeping 3 3 1 1 3   Tired, decreased energy 3 3 1 1 2   Change in appetite 1 0 0 1 1  Feeling bad or failure about yourself  1 0 0 0 0  Trouble concentrating 1 0 0 1 1  Moving slowly or fidgety/restless 3 0 0 0 0  Suicidal thoughts 0 0 0 0 0  PHQ-9 Score 14 6 2 6 9   Difficult doing work/chores Extremely dIfficult Not difficult  at all Not difficult at all Somewhat difficult Not difficult at all      06/30/2022   10:45 AM 08/25/2021   10:44 AM 04/21/2021    2:34 PM 03/03/2021    8:07 AM  GAD 7 : Generalized Anxiety Score  Nervous, Anxious, on Edge 3 1 1 1   Control/stop worrying 3 0 1 1  Worry too much - different things 3 0 1 1  Trouble relaxing 3 0 1 1  Restless 1 0 1 0  Easily annoyed or irritable 3 0 1 1  Afraid - awful might happen 3 0 1 0  Total GAD 7 Score 19 1 7 5   Anxiety Difficulty Extremely difficult Not difficult at all Somewhat difficult Not difficult at all     Medications: Outpatient Medications Prior to Visit  Medication Sig   acetaminophen (TYLENOL) 325 MG tablet Take 2 tablets (650 mg total) by mouth every 6 (six) hours as needed for mild pain (or Fever >/= 101). (Patient not taking: Reported on 06/30/2022)   ibuprofen (ADVIL) 200 MG tablet Take 800 mg by mouth every 8 (eight) hours as needed for headache or moderate pain. (Patient not taking: Reported on 06/30/2022)   Insulin Pen Needle 31G X 8 MM MISC Use as directed (Patient not taking: Reported on 06/30/2022)   oxyCODONE-acetaminophen (PERCOCET/ROXICET) 5-325  MG tablet Take 1-2 tablets by mouth every 4 (four) hours as needed for moderate pain. (Patient not taking: Reported on 06/30/2022)   PRESCRIPTION MEDICATION See admin instructions. CPAP- CONTINUOUS (Patient not taking: Reported on 06/30/2022)   Vitamin D, Ergocalciferol, (DRISDOL) 1.25 MG (50000 UNIT) CAPS capsule Take 1 capsule (50,000 Units total) by mouth every 7 (seven) days. (Patient not taking: Reported on 06/30/2022)   [DISCONTINUED] amLODipine (NORVASC) 10 MG tablet Take 1 tablet (10 mg total) by mouth daily. (Patient not taking: Reported on 06/30/2022)   [DISCONTINUED] dapagliflozin propanediol (FARXIGA) 10 MG TABS tablet Take 1 tablet (10 mg total) by mouth daily. (Patient not taking: Reported on 06/30/2022)   [DISCONTINUED] DULoxetine (CYMBALTA) 60 MG capsule Take 1 capsule (60 mg  total) by mouth daily. (Patient not taking: Reported on 06/30/2022)   [DISCONTINUED] furosemide (LASIX) 20 MG tablet Take 1 tablet (20 mg total) by mouth daily for 3 days. (Patient not taking: Reported on 06/30/2022)   [DISCONTINUED] insulin glargine-yfgn (SEMGLEE, YFGN,) 100 UNIT/ML Pen Inject 100 Units into the skin at bedtime. (Patient not taking: Reported on 06/30/2022)   [DISCONTINUED] lisinopril (ZESTRIL) 5 MG tablet Take 1 tablet (5 mg total) by mouth daily. (Patient not taking: Reported on 06/30/2022)   [DISCONTINUED] metFORMIN (GLUCOPHAGE) 1000 MG tablet Take 1 tablet (1,000 mg total) by mouth 2 (two) times daily with a meal. (Patient not taking: Reported on 06/30/2022)   No facility-administered medications prior to visit.    Review of Systems Review of Systems:  A fourteen system review of systems was performed and found to be positive as per HPI.  Last CBC Lab Results  Component Value Date   WBC 8.9 06/27/2022   HGB 15.2 (H) 06/27/2022   HCT 44.4 06/27/2022   MCV 90.1 06/27/2022   MCH 30.8 06/27/2022   RDW 13.5 06/27/2022   PLT 197 06/27/2022   Last metabolic panel Lab Results  Component Value Date   GLUCOSE 344 (H) 06/27/2022   NA 138 06/27/2022   K 4.0 06/27/2022   CL 104 06/27/2022   CO2 25 06/27/2022   BUN 16 06/27/2022   CREATININE 0.85 06/27/2022   GFRNONAA >60 06/27/2022   CALCIUM 9.3 06/27/2022   PROT 6.7 06/27/2022   ALBUMIN 4.0 06/27/2022   LABGLOB 2.7 02/24/2022   AGRATIO 1.6 02/24/2022   BILITOT 0.4 06/27/2022   ALKPHOS 46 06/27/2022   AST 13 (L) 06/27/2022   ALT 8 06/27/2022   ANIONGAP 9 06/27/2022   Last lipids Lab Results  Component Value Date   CHOL 172 06/20/2022   HDL 25 (L) 06/20/2022   LDLCALC 104 (H) 06/20/2022   LDLDIRECT 129 (H) 05/22/2013   TRIG 216 (H) 06/20/2022   CHOLHDL 6.9 06/20/2022   Last hemoglobin A1c Lab Results  Component Value Date   HGBA1C 7.4 (A) 06/30/2022   Last thyroid functions Lab Results  Component Value  Date   TSH 1.810 02/24/2022       Objective    BP 136/81   Pulse 78   Temp 98.5 F (36.9 C) (Temporal)   Ht 5\' 5"  (1.651 m)   Wt 262 lb (118.8 kg)   BMI 43.60 kg/m  BP Readings from Last 3 Encounters:  06/30/22 136/81  06/27/22 (!) 143/79  06/23/22 133/63   Wt Readings from Last 3 Encounters:  06/30/22 262 lb (118.8 kg)  06/27/22 255 lb 11.7 oz (116 kg)  06/19/22 256 lb (116.1 kg)    Physical Exam  General:  Pleasant and cooperative,  non-toxic appearing. Neuro:  Alert and oriented,  extra-ocular muscles intact  HEENT:  Normocephalic, atraumatic, neck supple  Skin:  no gross rash, warm, pink. Cardiac:  RRR, S1 S2 Respiratory: CTA B/L w/o wheezing, crackles or rales.  Vascular:  Ext warm, no cyanosis apprec.; +edema of lower extremities and hands Psych:  No HI/SI, judgement and insight good, Euthymic mood. Full Affect.   Results for orders placed or performed in visit on 06/30/22  POCT HgB A1C  Result Value Ref Range   Hemoglobin A1C 7.4 (A) 4.0 - 5.6 %   HbA1c POC (<> result, manual entry)     HbA1c, POC (prediabetic range)     HbA1c, POC (controlled diabetic range)      Assessment & Plan      Problem List Items Addressed This Visit       Cardiovascular and Mediastinum   Hypertension associated with diabetes [facility])    -Reviewed hospital notes, labs and imaging. Discussed with patient since she has been on HCTZ for numerous years I believe Ozempic is more likely the contributing factor to her recent pancreatitis since that is a newer medication. Will resume HCTZ especially the significant peripheral edema she is experiencing since being off medication. Recommend to continue Lisinopril 5 mg daily. Recent renal function and electrolytes normal. BP in office today sub-optimal.       Relevant Medications   furosemide (LASIX) 20 MG tablet   tirzepatide (MOUNJARO) 2.5 MG/0.5ML Pen   tirzepatide (MOUNJARO) 5 MG/0.5ML Pen   hydrochlorothiazide (HYDRODIURIL) 25 MG  tablet   dapagliflozin propanediol (FARXIGA) 10 MG TABS tablet   insulin glargine-yfgn (SEMGLEE, YFGN,) 100 UNIT/ML Pen   lisinopril (ZESTRIL) 5 MG tablet   metFORMIN (GLUCOPHAGE) 1000 MG tablet   atorvastatin (LIPITOR) 10 MG tablet     Digestive   Acute pancreatitis    -Reviewed recent hospital notes, labs and imaging. Discussed with patient likely Ozempic contributed. Will administer ketorolac 60 mg in office to help with pain since ibuprofen not helping. Patient has a mild/low allergy to medication and has tolerated ibuprofen which is also an anti-inflammatory.         Endocrine   Type 2 diabetes mellitus with hyperglycemia, with long-term current use of insulin (HCC)    -A1c today 7.4, which has improved from prior (7.7). Discussed with patient resuming insulin glargine 100 units at bedtime, Farxiga 10 mg daily, and Metformin 1000 mg BID. Discussed starting mealtime insulin and patient prefers to try [facility] instead. Discussed with patient Greggory Keen has potential side effect of pancreatitis, but she would like to try anyway before considering mealtime insulin. Strongly advised to monitor for similar symptoms of pancreatitis and immediately stop medication, pt verbalized understanding. Will start Mounjaro 2.5 mg once a week x 4 weeks and then titrate to 5 mg once a week.      Relevant Medications   tirzepatide (MOUNJARO) 2.5 MG/0.5ML Pen   tirzepatide (MOUNJARO) 5 MG/0.5ML Pen   dapagliflozin propanediol (FARXIGA) 10 MG TABS tablet   insulin glargine-yfgn (SEMGLEE, YFGN,) 100 UNIT/ML Pen   lisinopril (ZESTRIL) 5 MG tablet   metFORMIN (GLUCOPHAGE) 1000 MG tablet   atorvastatin (LIPITOR) 10 MG tablet   Other Relevant Orders   POCT HgB A1C (Completed)   Hyperlipidemia associated with type 2 diabetes mellitus (HCC)    -Given patient's existing co-morbidities and 10-year risk score of 20.4% benefits outweigh risks of statin therapy so will resume statin therapy with atorvastatin 10 mg  daily. If patient were to develop  another episode of pancreatitis then will consider alternatives. The 10-year ASCVD risk score (Arnett DK, et al., 2019) is: 20.4%       Relevant Medications   furosemide (LASIX) 20 MG tablet   tirzepatide (MOUNJARO) 2.5 MG/0.5ML Pen   tirzepatide (MOUNJARO) 5 MG/0.5ML Pen   hydrochlorothiazide (HYDRODIURIL) 25 MG tablet   dapagliflozin propanediol (FARXIGA) 10 MG TABS tablet   insulin glargine-yfgn (SEMGLEE, YFGN,) 100 UNIT/ML Pen   lisinopril (ZESTRIL) 5 MG tablet   metFORMIN (GLUCOPHAGE) 1000 MG tablet   atorvastatin (LIPITOR) 10 MG tablet     Other   Depression    -Acute illness contributing to mood. No SI/HI. Will continue current medication regimen and monitor for consistent worsening symptoms.      Relevant Medications   DULoxetine (CYMBALTA) 60 MG capsule   Other Visit Diagnoses     Peripheral edema    -  Primary   Relevant Medications   furosemide (LASIX) 20 MG tablet   furosemide (LASIX) injection 20 mg (Completed)      Peripheral edema: -Reviewed ED note and labs from 06/27/2022. Significant edema noted on exam. Will administer IM Lasix 20 mg and recommend to take oral Lasix 20 mg daily starting tomorrow for 5 days. Continue with elevation. Due to significant pain from swelling as well will also administer ketorolac 60 mg, pt has a mild/low allergy and if were to develop a severe allergic reaction recommend to seek immediate medical care/call 911. Patient left the office in stable condition.   Return in about 4 months (around 10/30/2022) for DM, HTN, HLD.        Mayer Masker, PA-C  [facility] at [facility] [phone] (phone) [phone] (fax)  [facility]

## 2022-06-30 NOTE — Assessment & Plan Note (Signed)
-  A1c today 7.4, which has improved from prior (7.7). Discussed with patient resuming insulin glargine 100 units at bedtime, Farxiga 10 mg daily, and Metformin 1000 mg BID. Discussed starting mealtime insulin and patient prefers to try [facility] instead. Discussed with patient Darcel Bayley has potential side effect of pancreatitis, but she would like to try anyway before considering mealtime insulin. Strongly advised to monitor for similar symptoms of pancreatitis and immediately stop medication, pt verbalized understanding. Will start Mounjaro 2.5 mg once a week x 4 weeks and then titrate to 5 mg once a week.

## 2022-06-30 NOTE — Assessment & Plan Note (Signed)
-  Reviewed hospital notes, labs and imaging. Discussed with patient since she has been on HCTZ for numerous years I believe Ozempic is more likely the contributing factor to her recent pancreatitis since that is a newer medication. Will resume HCTZ especially the significant peripheral edema she is experiencing since being off medication. Recommend to continue Lisinopril 5 mg daily. Recent renal function and electrolytes normal. BP in office today sub-optimal.

## 2022-07-07 ENCOUNTER — Telehealth: Payer: Self-pay

## 2022-07-07 NOTE — Telephone Encounter (Signed)
Patient called office about her FMLA paperwork, patient states that she needs It to be complete by this week, please advise, thanks!

## 2022-07-07 NOTE — Telephone Encounter (Signed)
Spoke with patient to inform her of no record of FMLA Paperwork, patient states that she will fax forms to office.

## 2022-07-07 NOTE — Telephone Encounter (Signed)
FMLA Forms faxed to [employer], and scanned into chart!

## 2022-07-15 ENCOUNTER — Telehealth: Payer: No Typology Code available for payment source | Admitting: Family Medicine

## 2022-07-15 ENCOUNTER — Other Ambulatory Visit (HOSPITAL_COMMUNITY): Payer: Self-pay

## 2022-07-15 DIAGNOSIS — L089 Local infection of the skin and subcutaneous tissue, unspecified: Secondary | ICD-10-CM

## 2022-07-15 MED ORDER — SULFAMETHOXAZOLE-TRIMETHOPRIM 800-160 MG PO TABS
1.0000 | ORAL_TABLET | Freq: Two times a day (BID) | ORAL | 0 refills | Status: AC
  Filled 2022-07-15: qty 14, 7d supply, fill #0

## 2022-07-15 NOTE — Progress Notes (Signed)
E Visit for Cellulitis  We are sorry that you are not feeling well. Here is how we plan to help!  Based on what you shared with me it looks like you have cellulitis.  Cellulitis looks like areas of skin redness, swelling, and warmth; it develops as a result of bacteria entering under the skin. Little red spots and/or bleeding can be seen in skin, and tiny surface sacs containing fluid can occur. Fever can be present. Cellulitis is almost always on one side of a body, and the lower limbs are the most common site of involvement.   I have prescribed:  Bactrim DS 1 tablet by mouth twice a day for 7 days  HOME CARE:  Take your medications as ordered and take all of them, even if the skin irritation appears to be healing.   GET HELP RIGHT AWAY IF:  Symptoms that don't begin to go away within 48 hours. Severe redness persists or worsens If the area turns color, spreads or swells. If it blisters and opens, develops yellow-brown crust or bleeds. You develop a fever or chills. If the pain increases or becomes unbearable.  Are unable to keep fluids and food down.  MAKE SURE YOU   Understand these instructions. Will watch your condition. Will get help right away if you are not doing well or get worse.  Thank you for choosing an e-visit.  Your e-visit answers were reviewed by a board certified advanced clinical practitioner to complete your personal care plan. Depending upon the condition, your plan could have included both over the counter or prescription medications.  Please review your pharmacy choice. Make sure the pharmacy is open so you can pick up prescription now. If there is a problem, you may contact your provider through MyChart messaging and have the prescription routed to another pharmacy.  Your safety is important to us. If you have drug allergies check your prescription carefully.   For the next 24 hours you can use MyChart to ask questions about today's visit, request a  non-urgent call back, or ask for a work or school excuse. You will get an email in the next two days asking about your experience. I hope that your e-visit has been valuable and will speed your recovery.  I provided 5 minutes of non face-to-face time during this encounter for chart review, medication and order placement, as well as and documentation.   

## 2022-07-30 ENCOUNTER — Other Ambulatory Visit (HOSPITAL_COMMUNITY): Payer: Self-pay

## 2022-07-30 ENCOUNTER — Other Ambulatory Visit: Payer: Self-pay | Admitting: Physician Assistant

## 2022-07-30 DIAGNOSIS — E1165 Type 2 diabetes mellitus with hyperglycemia: Secondary | ICD-10-CM

## 2022-09-27 ENCOUNTER — Other Ambulatory Visit (HOSPITAL_COMMUNITY): Payer: Self-pay

## 2022-09-29 ENCOUNTER — Other Ambulatory Visit (HOSPITAL_COMMUNITY): Payer: Self-pay

## 2022-10-21 ENCOUNTER — Other Ambulatory Visit: Payer: Self-pay | Admitting: Nurse Practitioner

## 2022-10-21 ENCOUNTER — Telehealth: Payer: Self-pay

## 2022-10-21 DIAGNOSIS — Z1231 Encounter for screening mammogram for malignant neoplasm of breast: Secondary | ICD-10-CM

## 2022-10-21 NOTE — Telephone Encounter (Signed)
Pt called requesting a Diagnostic Mammogram. Pt states that she has pain in the Rt Armpit and Rt breast for the last week. Movement also makes it worse.  Please advise

## 2022-10-21 NOTE — Telephone Encounter (Signed)
She is going to need to have an appointment for this so I can document the area of concern and order correct diagnostic imaging.

## 2022-10-22 NOTE — Telephone Encounter (Signed)
Pt will be coming in on Thursday at 11:10.  Thanks

## 2022-10-25 ENCOUNTER — Other Ambulatory Visit (HOSPITAL_COMMUNITY): Payer: Self-pay

## 2022-10-28 ENCOUNTER — Other Ambulatory Visit: Payer: Self-pay | Admitting: Nurse Practitioner

## 2022-10-28 ENCOUNTER — Ambulatory Visit (INDEPENDENT_AMBULATORY_CARE_PROVIDER_SITE_OTHER): Payer: 59 | Admitting: Nurse Practitioner

## 2022-10-28 ENCOUNTER — Other Ambulatory Visit (HOSPITAL_COMMUNITY): Payer: Self-pay

## 2022-10-28 ENCOUNTER — Encounter: Payer: Self-pay | Admitting: Nurse Practitioner

## 2022-10-28 VITALS — BP 148/84 | HR 78 | Resp 18 | Ht 65.0 in | Wt 258.0 lb

## 2022-10-28 DIAGNOSIS — N644 Mastodynia: Secondary | ICD-10-CM

## 2022-10-28 DIAGNOSIS — B372 Candidiasis of skin and nail: Secondary | ICD-10-CM

## 2022-10-28 DIAGNOSIS — E1165 Type 2 diabetes mellitus with hyperglycemia: Secondary | ICD-10-CM

## 2022-10-28 DIAGNOSIS — Z1231 Encounter for screening mammogram for malignant neoplasm of breast: Secondary | ICD-10-CM

## 2022-10-28 DIAGNOSIS — Z794 Long term (current) use of insulin: Secondary | ICD-10-CM

## 2022-10-28 LAB — POCT UA - MICROALBUMIN
Creatinine, POC: 200 mg/dL
Microalbumin Ur, POC: 80 mg/L

## 2022-10-28 MED ORDER — CLOTRIMAZOLE-BETAMETHASONE 1-0.05 % EX CREA
1.0000 | TOPICAL_CREAM | Freq: Two times a day (BID) | CUTANEOUS | 1 refills | Status: DC
  Filled 2022-10-28: qty 45, 23d supply, fill #0

## 2022-10-28 NOTE — Progress Notes (Signed)
Established patient visit   Patient: Renee Ho   DOB: 16-Sep-1976   47 y.o. Female  MRN: [id number] Visit Date: 10/28/2022   Chief Complaint  Patient presents with   Breast Pain    Right side   Subjective    HPI HPI     Breast Pain    Additional comments: Right side      Last edited by Gemma Payor, CMA on 10/28/2022 10:55 AM.      Right breast pain  -starts under the right arm and radiates down into the right breast. -aches all the time and sometimes the pain is sharp. -worse with activity  -has been present for about a month  -this is getting worse as time goes on.  -has not noted a specific lump or redness in effected area.    Medications: Outpatient Medications Prior to Visit  Medication Sig   atorvastatin (LIPITOR) 10 MG tablet Take 1 tablet (10 mg total) by mouth daily.   dapagliflozin propanediol (FARXIGA) 10 MG TABS tablet Take 1 tablet (10 mg total) by mouth daily.   DULoxetine (CYMBALTA) 60 MG capsule Take 1 capsule (60 mg total) by mouth daily.   hydrochlorothiazide (HYDRODIURIL) 25 MG tablet Take 1 tablet (25 mg total) by mouth daily.   insulin glargine-yfgn (SEMGLEE, YFGN,) 100 UNIT/ML Pen Inject 100 Units into the skin at bedtime.   lisinopril (ZESTRIL) 5 MG tablet Take 1 tablet (5 mg total) by mouth daily.   metFORMIN (GLUCOPHAGE) 1000 MG tablet Take 1 tablet (1,000 mg total) by mouth 2 (two) times daily with a meal.   tirzepatide (MOUNJARO) 2.5 MG/0.5ML Pen Inject 2.5 mg into the skin once a week for 4 weeks.   tirzepatide [facility]) 5 MG/0.5ML Pen Inject 5 mg into the skin once a week. Start 5 mg after completing 2.5 mg.   acetaminophen (TYLENOL) 325 MG tablet Take 2 tablets (650 mg total) by mouth every 6 (six) hours as needed for mild pain (or Fever >/= 101). (Patient not taking: Reported on 06/30/2022)   furosemide (LASIX) 20 MG tablet Take 1 tablet (20 mg total) by mouth daily for 5 days.   ibuprofen (ADVIL) 200 MG tablet Take 800 mg  by mouth every 8 (eight) hours as needed for headache or moderate pain. (Patient not taking: Reported on 06/30/2022)   Insulin Pen Needle 31G X 8 MM MISC Use as directed (Patient not taking: Reported on 06/30/2022)   oxyCODONE-acetaminophen (PERCOCET/ROXICET) 5-325 MG tablet Take 1-2 tablets by mouth every 4 (four) hours as needed for moderate pain. (Patient not taking: Reported on 06/30/2022)   PRESCRIPTION MEDICATION See admin instructions. CPAP- CONTINUOUS (Patient not taking: Reported on 06/30/2022)   Vitamin D, Ergocalciferol, (DRISDOL) 1.25 MG (50000 UNIT) CAPS capsule Take 1 capsule (50,000 Units total) by mouth every 7 (seven) days. (Patient not taking: Reported on 06/30/2022)   No facility-administered medications prior to visit.    Review of Systems  Constitutional:  Negative for activity change, appetite change, chills, fatigue and fever.  HENT:  Negative for congestion, postnasal drip, rhinorrhea, sinus pressure, sinus pain, sneezing and sore throat.   Eyes: Negative.   Respiratory:  Negative for cough, chest tightness, shortness of breath and wheezing.   Cardiovascular:  Negative for chest pain and palpitations.  Gastrointestinal:  Negative for abdominal pain, constipation, diarrhea, nausea and vomiting.  Endocrine: Negative for cold intolerance, heat intolerance, polydipsia and polyuria.  Genitourinary:  Negative for dyspareunia, dysuria, flank pain, frequency and urgency.  Musculoskeletal:  Negative for arthralgias, back pain and myalgias.  Skin:  Negative for rash.       Right underarm pain which radiates to outer aspect of right breast.   Allergic/Immunologic: Negative for environmental allergies.  Neurological:  Negative for dizziness, weakness and headaches.  Hematological:  Negative for adenopathy.  Psychiatric/Behavioral:  The patient is not nervous/anxious.        Objective     Today's Vitals   10/28/22 1055  BP: (Abnormal) 148/84  Pulse: 78  Resp: 18  SpO2: 98%   Weight: 258 lb (117 kg)  Height: 5' 5"$  (1.651 m)   Body mass index is 42.93 kg/m.  BP Readings from Last 3 Encounters:  10/28/22 (Abnormal) 148/84  06/30/22 136/81  06/27/22 (Abnormal) 143/79    Wt Readings from Last 3 Encounters:  10/28/22 258 lb (117 kg)  06/30/22 262 lb (118.8 kg)  06/27/22 255 lb 11.7 oz (116 kg)    Physical Exam Vitals and nursing note reviewed.  Constitutional:      Appearance: Normal appearance. She is well-developed.  HENT:     Head: Normocephalic.  Eyes:     Pupils: Pupils are equal, round, and reactive to light.  Cardiovascular:     Rate and Rhythm: Normal rate and regular rhythm.     Pulses: Normal pulses.     Heart sounds: Normal heart sounds.  Pulmonary:     Effort: Pulmonary effort is normal.     Breath sounds: Normal breath sounds.  Chest:    Abdominal:     Palpations: Abdomen is soft.  Musculoskeletal:        General: Normal range of motion.     Cervical back: Normal range of motion and neck supple.  Lymphadenopathy:     Cervical: No cervical adenopathy.  Skin:    General: Skin is warm and dry.     Capillary Refill: Capillary refill takes less than 2 seconds.  Neurological:     General: No focal deficit present.     Mental Status: She is alert and oriented to person, place, and time.  Psychiatric:        Mood and Affect: Mood normal.        Behavior: Behavior normal.        Thought Content: Thought content normal.        Judgment: Judgment normal.     Results for orders placed or performed in visit on 10/28/22  POCT UA - Microalbumin  Result Value Ref Range   Microalbumin Ur, POC 80 mg/L   Creatinine, POC 200 mg/dL   Albumin/Creatinine Ratio, Urine, POC 30-300     Assessment & Plan    1. Breast pain, right No palpable abnormalities. Will get diagnostic mammogram of bilateral breasts.  - MM DIAG BREAST TOMO BILATERAL; Future  2. Encounter for screening mammogram for malignant neoplasm of breast Screening mammogram  ordered today   3. Cutaneous candidiasis Pain of right breast may be due to cutaneous candidal infection. Apply Lotrisone cream to effected area twice daily.  - clotrimazole-betamethasone (LOTRISONE) cream; Apply 1 Application topically 2 (two) times daily.  Dispense: 45 g; Refill: 1  4. Type 2 diabetes mellitus with hyperglycemia, with long-term current use of insulin (HCC) Urine microalbumin mildly abnormal. Check HgbA1c at next routine office visit.  - POCT UA - Microalbumin   Problem List Items Addressed This Visit       Endocrine   Type 2 diabetes mellitus with hyperglycemia, with long-term current use of insulin ([place])  Relevant Orders   POCT UA - Microalbumin (Completed)     Musculoskeletal and Integument   Cutaneous candidiasis   Relevant Medications   clotrimazole-betamethasone (LOTRISONE) cream     Other   Breast pain, right - Primary   Relevant Orders   MM DIAG BREAST TOMO BILATERAL (Completed)   Other Visit Diagnoses     Encounter for screening mammogram for malignant neoplasm of breast            Return for as scheduled.         Ronnell Freshwater, NP  [facility] at [facility] [phone] (phone) [phone] (fax)  [place]

## 2022-10-29 ENCOUNTER — Ambulatory Visit
Admission: RE | Admit: 2022-10-29 | Discharge: 2022-10-29 | Disposition: A | Discharge status: Home / Self Care | Payer: 59 | Source: Ambulatory Visit | Attending: Nurse Practitioner | Admitting: Nurse Practitioner

## 2022-10-29 DIAGNOSIS — N644 Mastodynia: Secondary | ICD-10-CM

## 2022-10-31 NOTE — Progress Notes (Signed)
Negative findings on mammogram and ultrasound results. Screening mammogram in one year.

## 2022-10-31 NOTE — Progress Notes (Signed)
Negative findings on mammogram and ultrasound results. Screening mammogram in one year.

## 2022-11-11 ENCOUNTER — Other Ambulatory Visit (HOSPITAL_COMMUNITY): Payer: Self-pay

## 2022-11-25 ENCOUNTER — Ambulatory Visit: Payer: 59 | Admitting: Nurse Practitioner

## 2022-12-02 DIAGNOSIS — N644 Mastodynia: Secondary | ICD-10-CM | POA: Insufficient documentation

## 2022-12-02 DIAGNOSIS — B372 Candidiasis of skin and nail: Secondary | ICD-10-CM | POA: Insufficient documentation

## 2023-02-02 DIAGNOSIS — G4733 Obstructive sleep apnea (adult) (pediatric): Secondary | ICD-10-CM | POA: Diagnosis not present

## 2023-02-02 DIAGNOSIS — G4730 Sleep apnea, unspecified: Secondary | ICD-10-CM | POA: Diagnosis not present

## 2023-02-09 ENCOUNTER — Telehealth: Payer: 59 | Admitting: Family Medicine

## 2023-02-09 ENCOUNTER — Other Ambulatory Visit (HOSPITAL_COMMUNITY): Payer: Self-pay

## 2023-02-09 DIAGNOSIS — H66001 Acute suppurative otitis media without spontaneous rupture of ear drum, right ear: Secondary | ICD-10-CM

## 2023-02-09 MED ORDER — AMOXICILLIN-POT CLAVULANATE 875-125 MG PO TABS
1.0000 | ORAL_TABLET | Freq: Two times a day (BID) | ORAL | 0 refills | Status: AC
  Filled 2023-02-09: qty 14, 7d supply, fill #0

## 2023-02-09 NOTE — Progress Notes (Signed)
E-Visit for Ear Pain - Acute Otitis Media   We are sorry that you are not feeling well. Here is how we plan to help!  Based on what you have shared with me it looks like you have Acute Otitis Media.  Acute Otitis Media is an infection of the middle or "inner" ear. This type of infection can cause redness, inflammation, and fluid buildup behind the tympanic membrane (ear drum).  The usual symptoms include: Earache/Pain Fever Upper respiratory symptoms Lack of energy/Fatigue/Malaise Slight hearing loss gradually worsening- if the inner ear fills with fluid What causes middle ear infections? Most middle ear infections occur when an infection such as a cold, leads to a build-up of mucus in the middle ear and causes the Eustachian tube (a thin tube that runs from the middle ear to the back of the nose) to become swollen or blocked.   This means mucus can't drain away properly, making it easier for an infection to spread into the middle ear.  How middle ear infections are treated: Most ear infections clear up within three to five days and don't need any specific treatment. If necessary, tylenol or ibuprofen should be used to relieve pain and a high temperature.  If you develop a fever higher than 102, or any significantly worsening symptoms, this could indicate a more serious infection moving to the middle/inner and needs face to face evaluation in an office by a provider.   Antibiotics aren't routinely used to treat middle ear infections, although they may occasionally be prescribed if symptoms persist or are particularly severe. Given your presentation,   I have prescribed Augmentin 875-125 mg one tablet by mouth twice a day for 10 days   Your symptoms should improve over the next 3 days and should resolve in about 7 days. Be sure to complete ALL of the prescription(s) given.  HOME CARE: Wash your hands frequently. If you are prescribed an ear drop, do not place the tip of the bottle on your  ear or touch it with your fingers. You can take Acetaminophen 650 mg every 4-6 hours as needed for pain.  If pain is severe or moderate, you can apply a heating pad (set on low) or hot water bottle (wrapped in a towel) to outer ear for 20 minutes.  This will also increase drainage.  GET HELP RIGHT AWAY IF: Fever is over 102.2 degrees. You develop progressive ear pain or hearing loss. Ear symptoms persist longer than 3 days after treatment.  MAKE SURE YOU: Understand these instructions. Will watch your condition. Will get help right away if you are not doing well or get worse.  Thank you for choosing an e-visit.  Your e-visit answers were reviewed by a board certified advanced clinical practitioner to complete your personal care plan. Depending upon the condition, your plan could have included both over the counter or prescription medications.  Please review your pharmacy choice. Make sure the pharmacy is open so you can pick up the prescription now. If there is a problem, you may contact your provider through MyChart messaging and have the prescription routed to another pharmacy.  Your safety is important to us. If you have drug allergies check your prescription carefully.   For the next 24 hours you can use MyChart to ask questions about today's visit, request a non-urgent call back, or ask for a work or school excuse. You will get an email with a survey after your eVisit asking about your experience. We would appreciate your   your feedback. I hope that your e-visit has been valuable and will aid in your recovery.   I provided 5 minutes of non face-to-face time during this encounter for chart review, medication and order placement, as well as and documentation.

## 2023-02-21 ENCOUNTER — Telehealth: Payer: Self-pay

## 2023-02-21 NOTE — Progress Notes (Deleted)
PATIENT: Renee Ho DOB: Jun 20, 1976  REASON FOR VISIT: follow up HISTORY FROM: patient  Virtual Visit via Telephone Note  I connected with Renee Ho on 02/21/23 at  9:00 AM EDT by telephone and verified that I am speaking with the correct person using two identifiers.   I discussed the limitations, risks, security and privacy concerns of performing an evaluation and management service by telephone and the availability of in person appointments. I also discussed with the patient that there may be a patient responsible charge related to this service. The patient expressed understanding and agreed to proceed.   History of Present Illness:  02/21/23 ALL: Renee Ho is a 47 y.o. female here today for follow up for OSA on CPAP.     History (copied from Dr Teofilo Pod previous note)  [name] is a 47 year old right-handed woman with an underlying medical history of PCOS, hypertension, hyperlipidemia, diabetes, depression, and severe obesity with a BMI of over 40, who presents for follow-up consultation of her obstructive sleep testing and starting new equipment.  The patient is unaccompanied today.  I last saw her on 08/12/2020, at which time she was compliant with her old CPAP of 15 cm.  She was eligible for new equipment.  We mutually agreed to proceed with retesting.  She had an interim home sleep test on 11/17/2020 which indicated severe obstructive sleep apnea with an AHI of 54.1/h, O2 nadir 84%.  She was prescribed a new AutoPap machine.  Her set up date was 07/22/2021. She has a ResMed air sense 11 AutoSet machine.  She has not been seen in this clinic since November 2021.   Today, 02/16/2022: I reviewed her AutoPap compliance data from 01/16/2022 through 02/14/2022, which is a total of 30 days used 2 machine every night with percent use days greater than 4 hours at 97%, indicating excellent compliance with an average usage of 9 hours and 20  minutes, residual AHI at goal at 4.1/h, 95th percentile of pressure at 14.5 cm with a range of 7 to 15 cm with EPR of 2.  Leak on the left side fairly consistently with the 95th percentile at 67 mL/min.  She reports well with her new AutoPap machine.  She likes the machine, she uses a fullface mask and does notice the leak because of frequent position changes, sometimes she even sleeps on her stomach.  She has been compliant with treatment, we talked about her study results as well as her compliance data.  She is working on smoking cessation.  She has been on Ozempic for nearly 1 year and has lost over 20 pounds, A1c has come down from 2 digits to less than 7.   Observations/Objective:  Generalized: Well developed, in no acute distress  Mentation: Alert oriented to time, place, history taking. Follows all commands speech and language fluent   Assessment and Plan:  47 y.o. year old female  has a past medical history of Depression, Diabetes mellitus, Diabetes mellitus without complication (HCC), Diverticula of intestine, High cholesterol, Hypertension, OSA on CPAP, Polycystic ovarian syndrome, and Sleep apnea. here with  No diagnosis found.  Loye is doing well on CPAP therapy. Compliance report reveals excellent compliance. There is a large air leak. She was advised to monitor for this at home. Ensure correct mask seal. Consider mask refitting if needed. AHI well managed. Healthy lifestyle habits encouraged. She will follow up in 1 year, sooner if needed.   No orders of the defined types were  placed in this encounter.   No orders of the defined types were placed in this encounter.    Follow Up Instructions:  I discussed the assessment and treatment plan with the patient. The patient was provided an opportunity to ask questions and all were answered. The patient agreed with the plan and demonstrated an understanding of the instructions.   The patient was advised to call back or seek an  in-person evaluation if the symptoms worsen or if the condition fails to improve as anticipated.  I provided *** minutes of non-face-to-face time during this encounter. Patient located at their place of residence during Mychart visit. Provider is in the office.    Shawnie Dapper, NP

## 2023-02-22 ENCOUNTER — Telehealth: Payer: No Typology Code available for payment source | Admitting: Family Medicine

## 2023-02-22 DIAGNOSIS — G4733 Obstructive sleep apnea (adult) (pediatric): Secondary | ICD-10-CM

## 2023-03-04 DIAGNOSIS — G4733 Obstructive sleep apnea (adult) (pediatric): Secondary | ICD-10-CM | POA: Diagnosis not present

## 2023-03-04 DIAGNOSIS — G4730 Sleep apnea, unspecified: Secondary | ICD-10-CM | POA: Diagnosis not present

## 2023-03-18 ENCOUNTER — Other Ambulatory Visit (HOSPITAL_COMMUNITY): Payer: Self-pay

## 2023-03-18 ENCOUNTER — Other Ambulatory Visit: Payer: Self-pay | Admitting: Physician Assistant

## 2023-03-18 ENCOUNTER — Other Ambulatory Visit: Payer: Self-pay

## 2023-03-18 DIAGNOSIS — F32A Depression, unspecified: Secondary | ICD-10-CM

## 2023-03-18 MED ORDER — DULOXETINE HCL 30 MG PO CPEP
30.0000 mg | ORAL_CAPSULE | Freq: Every day | ORAL | 1 refills | Status: DC
  Filled 2023-03-18: qty 90, 90d supply, fill #0

## 2023-03-22 ENCOUNTER — Other Ambulatory Visit (HOSPITAL_COMMUNITY): Payer: Self-pay

## 2023-03-22 ENCOUNTER — Encounter: Payer: Self-pay | Admitting: Nurse Practitioner

## 2023-03-22 ENCOUNTER — Ambulatory Visit (INDEPENDENT_AMBULATORY_CARE_PROVIDER_SITE_OTHER): Payer: 59 | Admitting: Nurse Practitioner

## 2023-03-22 VITALS — BP 148/88 | HR 69 | Ht 65.0 in | Wt 271.7 lb

## 2023-03-22 DIAGNOSIS — H5713 Ocular pain, bilateral: Secondary | ICD-10-CM | POA: Insufficient documentation

## 2023-03-22 DIAGNOSIS — E785 Hyperlipidemia, unspecified: Secondary | ICD-10-CM | POA: Diagnosis not present

## 2023-03-22 DIAGNOSIS — I152 Hypertension secondary to endocrine disorders: Secondary | ICD-10-CM

## 2023-03-22 DIAGNOSIS — E1159 Type 2 diabetes mellitus with other circulatory complications: Secondary | ICD-10-CM | POA: Diagnosis not present

## 2023-03-22 DIAGNOSIS — H109 Unspecified conjunctivitis: Secondary | ICD-10-CM | POA: Insufficient documentation

## 2023-03-22 DIAGNOSIS — H539 Unspecified visual disturbance: Secondary | ICD-10-CM

## 2023-03-22 DIAGNOSIS — Z1211 Encounter for screening for malignant neoplasm of colon: Secondary | ICD-10-CM

## 2023-03-22 DIAGNOSIS — E1169 Type 2 diabetes mellitus with other specified complication: Secondary | ICD-10-CM

## 2023-03-22 DIAGNOSIS — Z794 Long term (current) use of insulin: Secondary | ICD-10-CM | POA: Diagnosis not present

## 2023-03-22 DIAGNOSIS — E1165 Type 2 diabetes mellitus with hyperglycemia: Secondary | ICD-10-CM | POA: Diagnosis not present

## 2023-03-22 DIAGNOSIS — F32A Depression, unspecified: Secondary | ICD-10-CM | POA: Diagnosis not present

## 2023-03-22 LAB — POCT GLYCOSYLATED HEMOGLOBIN (HGB A1C): HbA1c POC (<> result, manual entry): 10.1 % (ref 4.0–5.6)

## 2023-03-22 MED ORDER — METFORMIN HCL 1000 MG PO TABS
1000.0000 mg | ORAL_TABLET | Freq: Two times a day (BID) | ORAL | 1 refills | Status: DC
  Filled 2023-03-22: qty 180, 90d supply, fill #0

## 2023-03-22 MED ORDER — LISINOPRIL 5 MG PO TABS
5.0000 mg | ORAL_TABLET | Freq: Every day | ORAL | 1 refills | Status: DC
  Filled 2023-03-22: qty 90, 90d supply, fill #0

## 2023-03-22 MED ORDER — DAPAGLIFLOZIN PROPANEDIOL 10 MG PO TABS
10.0000 mg | ORAL_TABLET | Freq: Every day | ORAL | 1 refills | Status: DC
  Filled 2023-03-22: qty 90, 90d supply, fill #0

## 2023-03-22 MED ORDER — ATORVASTATIN CALCIUM 10 MG PO TABS
10.0000 mg | ORAL_TABLET | Freq: Every day | ORAL | 1 refills | Status: DC
  Filled 2023-03-22: qty 90, 90d supply, fill #0

## 2023-03-22 MED ORDER — INSULIN GLARGINE-YFGN 100 UNIT/ML ~~LOC~~ SOPN
100.0000 [IU] | PEN_INJECTOR | Freq: Every day | SUBCUTANEOUS | 2 refills | Status: DC
  Filled 2023-03-22 – 2023-07-14 (×2): qty 90, 90d supply, fill #0

## 2023-03-22 MED ORDER — DULOXETINE HCL 30 MG PO CPEP
90.0000 mg | ORAL_CAPSULE | Freq: Every day | ORAL | 1 refills | Status: DC
  Filled 2023-03-22: qty 270, 90d supply, fill #0

## 2023-03-22 MED ORDER — TIRZEPATIDE 7.5 MG/0.5ML ~~LOC~~ SOAJ
7.5000 mg | SUBCUTANEOUS | 1 refills | Status: DC
  Filled 2023-03-22: qty 6, 84d supply, fill #0
  Filled 2023-04-13: qty 2, 28d supply, fill #0
  Filled 2023-05-16: qty 2, 28d supply, fill #1
  Filled 2023-06-21: qty 2, 28d supply, fill #2
  Filled 2023-07-12 – 2023-07-14 (×2): qty 2, 28d supply, fill #3

## 2023-03-22 MED ORDER — POLYMYXIN B-TRIMETHOPRIM 10000-0.1 UNIT/ML-% OP SOLN
1.0000 [drp] | OPHTHALMIC | 0 refills | Status: DC
  Filled 2023-03-22: qty 10, 8d supply, fill #0

## 2023-03-22 NOTE — Assessment & Plan Note (Signed)
Blood pressure elevated today, however, generally well managed.  -no changes made to medications  -recommend heart healthy diet

## 2023-03-22 NOTE — Assessment & Plan Note (Signed)
-  refer to eye doctor for further evaluation.  

## 2023-03-22 NOTE — Assessment & Plan Note (Signed)
Check fasting lipids prior to next visit and adjust atorvastatin as indicated

## 2023-03-22 NOTE — Progress Notes (Signed)
Established patient visit   Patient: Renee Ho   DOB: [birth year]   47 y.o. Female  MRN: [id number] Visit Date: 03/22/2023   Chief Complaint  Patient presents with   Medical Management of Chronic Issues   Subjective    HPI  Follow up  DM2 -HgbA1c is 10.1 today --she went over 2 months without mounjaro or long acting insulin. She restarted these medication on this past Sunday.  -reports personal and financial strain as reason for interruption in treatment.  -needs colon cancer screen -having bilateral eye pain with irritation. Severe watering of both eyes. Visual changes.  --she does need to have referral for eye doctor.  --also needs diabetic eye exam  -overdue to have CPE   Medications: Outpatient Medications Prior to Visit  Medication Sig   hydrochlorothiazide (HYDRODIURIL) 25 MG tablet Take 1 tablet (25 mg total) by mouth daily.   [DISCONTINUED] atorvastatin (LIPITOR) 10 MG tablet Take 1 tablet (10 mg total) by mouth daily.   [DISCONTINUED] dapagliflozin propanediol (FARXIGA) 10 MG TABS tablet Take 1 tablet (10 mg total) by mouth daily.   [DISCONTINUED] DULoxetine (CYMBALTA) 30 MG capsule Take 1 capsule (30 mg total) by mouth daily.   [DISCONTINUED] insulin glargine-yfgn (SEMGLEE, YFGN,) 100 UNIT/ML Pen Inject 100 Units into the skin at bedtime.   [DISCONTINUED] lisinopril (ZESTRIL) 5 MG tablet Take 1 tablet (5 mg total) by mouth daily.   [DISCONTINUED] metFORMIN (GLUCOPHAGE) 1000 MG tablet Take 1 tablet (1,000 mg total) by mouth 2 (two) times daily with a meal.   [DISCONTINUED] tirzepatide (MOUNJARO) 2.5 MG/0.5ML Pen Inject 2.5 mg into the skin once a week for 4 weeks.   [DISCONTINUED] tirzepatide [facility]) 5 MG/0.5ML Pen Inject 5 mg into the skin once a week. Start 5 mg after completing 2.5 mg.   clotrimazole-betamethasone (LOTRISONE) cream Apply 1 Application topically 2 (two) times daily. (Patient not taking: Reported on 03/22/2023)   ibuprofen (ADVIL) 200  MG tablet Take 800 mg by mouth every 8 (eight) hours as needed for headache or moderate pain. (Patient not taking: Reported on 06/30/2022)   Insulin Pen Needle 31G X 8 MM MISC Use as directed (Patient not taking: Reported on 06/30/2022)   PRESCRIPTION MEDICATION See admin instructions. CPAP- CONTINUOUS (Patient not taking: Reported on 06/30/2022)   [DISCONTINUED] acetaminophen (TYLENOL) 325 MG tablet Take 2 tablets (650 mg total) by mouth every 6 (six) hours as needed for mild pain (or Fever >/= 101). (Patient not taking: Reported on 06/30/2022)   [DISCONTINUED] furosemide (LASIX) 20 MG tablet Take 1 tablet (20 mg total) by mouth daily for 5 days.   [DISCONTINUED] oxyCODONE-acetaminophen (PERCOCET/ROXICET) 5-325 MG tablet Take 1-2 tablets by mouth every 4 (four) hours as needed for moderate pain. (Patient not taking: Reported on 06/30/2022)   [DISCONTINUED] Vitamin D, Ergocalciferol, (DRISDOL) 1.25 MG (50000 UNIT) CAPS capsule Take 1 capsule (50,000 Units total) by mouth every 7 (seven) days. (Patient not taking: Reported on 06/30/2022)   No facility-administered medications prior to visit.    Review of Systems See HPI      Objective     Today's Vitals   03/22/23 1335 03/22/23 1412  BP: (Abnormal) 149/85 (Abnormal) 148/88  Pulse: 69   SpO2: 96%   Weight: 271 lb 11.2 oz (123.2 kg)   Height: 5\' 5"  (1.651 m)    Body mass index is 45.21 kg/m.  BP Readings from Last 3 Encounters:  03/22/23 (Abnormal) 148/88  10/28/22 (Abnormal) 148/84  06/30/22 136/81    Wt Readings from  Last 3 Encounters:  03/22/23 271 lb 11.2 oz (123.2 kg)  10/28/22 258 lb (117 kg)  06/30/22 262 lb (118.8 kg)    Physical Exam Vitals and nursing note reviewed.  Constitutional:      Appearance: Normal appearance. She is well-developed. She is obese.  HENT:     Head: Normocephalic and atraumatic.     Nose: Nose normal.     Mouth/Throat:     Mouth: Mucous membranes are moist.     Pharynx: Oropharynx is clear.   Eyes:     Extraocular Movements: Extraocular movements intact.     Conjunctiva/sclera: Conjunctivae normal.     Pupils: Pupils are equal, round, and reactive to light.     Comments: Severe redness and irritation of sclera of both eyes. Swelling of upper and lower lids. Redness and irritation of lower lid conjunctiva. Moderate watering noted .  Neck:     Vascular: No carotid bruit.  Cardiovascular:     Rate and Rhythm: Normal rate and regular rhythm.     Pulses: Normal pulses.     Heart sounds: Normal heart sounds.  Pulmonary:     Effort: Pulmonary effort is normal.     Breath sounds: Normal breath sounds.  Abdominal:     Palpations: Abdomen is soft.  Musculoskeletal:        General: Normal range of motion.     Cervical back: Normal range of motion and neck supple.  Lymphadenopathy:     Cervical: No cervical adenopathy.  Skin:    General: Skin is warm and dry.     Capillary Refill: Capillary refill takes less than 2 seconds.  Neurological:     General: No focal deficit present.     Mental Status: She is alert and oriented to person, place, and time.  Psychiatric:        Mood and Affect: Mood normal.        Behavior: Behavior normal.        Thought Content: Thought content normal.        Judgment: Judgment normal.      Results for orders placed or performed in visit on 03/22/23  POCT glycosylated hemoglobin (Hb A1C)  Result Value Ref Range   Hemoglobin A1C     HbA1c POC (<> result, manual entry) 10.1 4.0 - 5.6 %   HbA1c, POC (prediabetic range)     HbA1c, POC (controlled diabetic range)      Assessment & Plan    Type 2 diabetes mellitus with hyperglycemia, with long-term current use of insulin (HCC) Assessment & Plan: HgbA1c 10.1 today.  -admits to being out of medication for over 2 months.  -increase mounjaro to 7.5 mg weekly  -continue other diabetic medication as prescribed  -refer to diabetic eye exam   Orders: -     POCT glycosylated hemoglobin (Hb A1C) -      Ambulatory referral to Ophthalmology -     Dapagliflozin Propanediol; Take 1 tablet (10 mg total) by mouth daily.  Dispense: 90 tablet; Refill: 1 -     Insulin Glargine-yfgn; Inject 100 Units into the skin at bedtime.  Dispense: 90 mL; Refill: 2 -     Lisinopril; Take 1 tablet (5 mg total) by mouth daily.  Dispense: 90 tablet; Refill: 1 -     metFORMIN HCl; Take 1 tablet (1,000 mg total) by mouth 2 (two) times daily with a meal.  Dispense: 180 tablet; Refill: 1 -     Tirzepatide; Inject 7.5 mg  into the skin once a week.  Dispense: 6 mL; Refill: 1  Hypertension associated with diabetes (HCC) Assessment & Plan: Blood pressure elevated today, however, generally well managed.  -no changes made to medications  -recommend heart healthy diet  Orders: -     POCT glycosylated hemoglobin (Hb A1C) -     Lisinopril; Take 1 tablet (5 mg total) by mouth daily.  Dispense: 90 tablet; Refill: 1  Hyperlipidemia associated with type 2 diabetes mellitus (HCC) Assessment & Plan: Check fasting lipids prior to next visit and adjust atorvastatin as indicated    Orders: -     Atorvastatin Calcium; Take 1 tablet (10 mg total) by mouth daily.  Dispense: 90 tablet; Refill: 1  Bacterial conjunctivitis Assessment & Plan: Start polytrim eye drops. Use 1 drop in both eyes every 2 to 3 hours while awake for next 7 to 10 days.  -refer to eye doctor for further evaluation.   Orders: -     Polymyxin B-Trimethoprim; Place 1 drop into both eyes every 2 (two) hours. While awake  Dispense: 10 mL; Refill: 0  Eye pain, bilateral Assessment & Plan: -refer to eye doctor for further evaluation.   Orders: -     Ambulatory referral to Ophthalmology  Visual changes Assessment & Plan: -refer to eye doctor for further evaluation.   Orders: -     Ambulatory referral to Ophthalmology  Depression, unspecified depression type Assessment & Plan: -stable.  --continue cymbalta 90 mg daily. New prescription sent to her  pharmacy today   Orders: -     DULoxetine HCl; Take 3 capsules (90 mg total) by mouth daily.  Dispense: 270 capsule; Refill: 1  Screening for colon cancer -     Ambulatory referral to Gastroenterology     Return in about 3 months (around 06/22/2023) for health maintenance exam, FBW a week prior to visit.         Carlean Jews, NP  [facility] [phone] (phone) [phone] (fax)  [facility]

## 2023-03-22 NOTE — Assessment & Plan Note (Signed)
HgbA1c 10.1 today.  -admits to being out of medication for over 2 months.  -increase mounjaro to 7.5 mg weekly  -continue other diabetic medication as prescribed  -refer to diabetic eye exam

## 2023-03-22 NOTE — Assessment & Plan Note (Signed)
-  stable.  --continue cymbalta 90 mg daily. New prescription sent to her pharmacy today

## 2023-03-22 NOTE — Assessment & Plan Note (Signed)
Start polytrim eye drops. Use 1 drop in both eyes every 2 to 3 hours while awake for next 7 to 10 days.  -refer to eye doctor for further evaluation.

## 2023-03-22 NOTE — Assessment & Plan Note (Signed)
-  refer to eye doctor for further evaluation.

## 2023-03-23 ENCOUNTER — Other Ambulatory Visit (HOSPITAL_COMMUNITY): Payer: Self-pay

## 2023-04-04 DIAGNOSIS — G4733 Obstructive sleep apnea (adult) (pediatric): Secondary | ICD-10-CM | POA: Diagnosis not present

## 2023-04-04 DIAGNOSIS — G4730 Sleep apnea, unspecified: Secondary | ICD-10-CM | POA: Diagnosis not present

## 2023-04-13 ENCOUNTER — Other Ambulatory Visit (HOSPITAL_COMMUNITY): Payer: Self-pay

## 2023-05-16 ENCOUNTER — Other Ambulatory Visit (HOSPITAL_COMMUNITY): Payer: Self-pay

## 2023-06-21 ENCOUNTER — Other Ambulatory Visit (HOSPITAL_COMMUNITY): Payer: Self-pay

## 2023-06-30 ENCOUNTER — Encounter (HOSPITAL_BASED_OUTPATIENT_CLINIC_OR_DEPARTMENT_OTHER): Payer: Self-pay | Admitting: Emergency Medicine

## 2023-06-30 ENCOUNTER — Emergency Department (HOSPITAL_BASED_OUTPATIENT_CLINIC_OR_DEPARTMENT_OTHER): Payer: 59

## 2023-06-30 ENCOUNTER — Other Ambulatory Visit: Payer: Self-pay

## 2023-06-30 ENCOUNTER — Other Ambulatory Visit (HOSPITAL_COMMUNITY): Payer: Self-pay

## 2023-06-30 ENCOUNTER — Emergency Department (HOSPITAL_BASED_OUTPATIENT_CLINIC_OR_DEPARTMENT_OTHER)
Admission: EM | Admit: 2023-06-30 | Discharge: 2023-06-30 | Disposition: A | Discharge status: Home / Self Care | Payer: 59 | Attending: Emergency Medicine | Admitting: Emergency Medicine

## 2023-06-30 DIAGNOSIS — I10 Essential (primary) hypertension: Secondary | ICD-10-CM | POA: Diagnosis not present

## 2023-06-30 DIAGNOSIS — N3289 Other specified disorders of bladder: Secondary | ICD-10-CM | POA: Diagnosis not present

## 2023-06-30 DIAGNOSIS — E119 Type 2 diabetes mellitus without complications: Secondary | ICD-10-CM | POA: Diagnosis not present

## 2023-06-30 DIAGNOSIS — Z7984 Long term (current) use of oral hypoglycemic drugs: Secondary | ICD-10-CM | POA: Insufficient documentation

## 2023-06-30 DIAGNOSIS — Z79899 Other long term (current) drug therapy: Secondary | ICD-10-CM | POA: Diagnosis not present

## 2023-06-30 DIAGNOSIS — R1013 Epigastric pain: Secondary | ICD-10-CM | POA: Insufficient documentation

## 2023-06-30 DIAGNOSIS — Z794 Long term (current) use of insulin: Secondary | ICD-10-CM | POA: Insufficient documentation

## 2023-06-30 DIAGNOSIS — Z90710 Acquired absence of both cervix and uterus: Secondary | ICD-10-CM | POA: Diagnosis not present

## 2023-06-30 DIAGNOSIS — K859 Acute pancreatitis without necrosis or infection, unspecified: Secondary | ICD-10-CM | POA: Diagnosis not present

## 2023-06-30 DIAGNOSIS — R109 Unspecified abdominal pain: Secondary | ICD-10-CM | POA: Diagnosis not present

## 2023-06-30 DIAGNOSIS — Z8719 Personal history of other diseases of the digestive system: Secondary | ICD-10-CM

## 2023-06-30 LAB — CBC
HCT: 47.5 % — ABNORMAL HIGH (ref 36.0–46.0)
Hemoglobin: 16.4 g/dL — ABNORMAL HIGH (ref 12.0–15.0)
MCH: 31.4 pg (ref 26.0–34.0)
MCHC: 34.5 g/dL (ref 30.0–36.0)
MCV: 90.8 fL (ref 80.0–100.0)
Platelets: 176 10*3/uL (ref 150–400)
RBC: 5.23 MIL/uL — ABNORMAL HIGH (ref 3.87–5.11)
RDW: 14 % (ref 11.5–15.5)
WBC: 14.1 10*3/uL — ABNORMAL HIGH (ref 4.0–10.5)
nRBC: 0 % (ref 0.0–0.2)

## 2023-06-30 LAB — URINALYSIS, ROUTINE W REFLEX MICROSCOPIC
Bacteria, UA: NONE SEEN
Bilirubin Urine: NEGATIVE
Glucose, UA: >1000 mg/dL — AB
Hgb urine dipstick: NEGATIVE
Ketones, ur: NEGATIVE mg/dL
Leukocytes,Ua: NEGATIVE
Nitrite: NEGATIVE
Protein, ur: NEGATIVE mg/dL
Specific Gravity, Urine: 1.011 (ref 1.005–1.030)
pH: 6 (ref 5.0–8.0)

## 2023-06-30 LAB — COMPREHENSIVE METABOLIC PANEL
ALT: 6 U/L (ref 0–44)
AST: 14 U/L — ABNORMAL LOW (ref 15–41)
Albumin: 4 g/dL (ref 3.5–5.0)
Alkaline Phosphatase: 46 U/L (ref 38–126)
Anion gap: 8 (ref 5–15)
BUN: 19 mg/dL (ref 6–20)
CO2: 23 mmol/L (ref 22–32)
Calcium: 8.9 mg/dL (ref 8.9–10.3)
Chloride: 106 mmol/L (ref 98–111)
Creatinine, Ser: 1.08 mg/dL — ABNORMAL HIGH (ref 0.44–1.00)
GFR, Estimated: >60 mL/min (ref >60)
Glucose, Bld: 174 mg/dL — ABNORMAL HIGH (ref 70–99)
Potassium: 4.1 mmol/L (ref 3.5–5.1)
Sodium: 137 mmol/L (ref 135–145)
Total Bilirubin: 0.4 mg/dL (ref 0.3–1.2)
Total Protein: 6.9 g/dL (ref 6.5–8.1)

## 2023-06-30 LAB — LIPASE, BLOOD: Lipase: 53 U/L — ABNORMAL HIGH (ref 11–51)

## 2023-06-30 MED ORDER — FENTANYL CITRATE PF 50 MCG/ML IJ SOSY
50.0000 ug | PREFILLED_SYRINGE | Freq: Once | INTRAMUSCULAR | Status: AC
  Administered 2023-06-30: 50 ug via INTRAVENOUS
  Filled 2023-06-30: qty 1

## 2023-06-30 MED ORDER — IOHEXOL 300 MG/ML  SOLN
100.0000 mL | Freq: Once | INTRAMUSCULAR | Status: AC | PRN
  Administered 2023-06-30: 100 mL via INTRAVENOUS

## 2023-06-30 MED ORDER — SODIUM CHLORIDE 0.9 % IV BOLUS
1000.0000 mL | Freq: Once | INTRAVENOUS | Status: AC
  Administered 2023-06-30: 1000 mL via INTRAVENOUS

## 2023-06-30 MED ORDER — HYDROCODONE-ACETAMINOPHEN 5-325 MG PO TABS
1.0000 | ORAL_TABLET | Freq: Four times a day (QID) | ORAL | 0 refills | Status: DC | PRN
  Filled 2023-06-30: qty 15, 2d supply, fill #0

## 2023-06-30 MED ORDER — ONDANSETRON HCL 4 MG/2ML IJ SOLN
4.0000 mg | Freq: Once | INTRAMUSCULAR | Status: AC
  Administered 2023-06-30: 4 mg via INTRAVENOUS
  Filled 2023-06-30: qty 2

## 2023-06-30 MED ORDER — ONDANSETRON 4 MG PO TBDP
4.0000 mg | ORAL_TABLET | Freq: Once | ORAL | Status: AC | PRN
  Administered 2023-06-30: 4 mg via ORAL
  Filled 2023-06-30: qty 1

## 2023-06-30 NOTE — ED Provider Notes (Signed)
[place] EMERGENCY DEPARTMENT AT [facility] Provider Note   CSN: [id number] Arrival date & time: 06/30/23  0051     History  Chief Complaint  Patient presents with   Abdominal Pain    Renee Ho is a 47 y.o. female.  Patient is a 47 year old female with history of type 2 diabetes, polycystic ovaries, hypertension, and obesity, hyperlipidemia, and recurrent pancreatitis.  Patient presenting today with complaints of abdominal pain.  She describes a several day history of pain across her upper abdomen and along with excessive belching.  She denies any fevers or chills.  She denies any bowel complaints.  No urinary complaints.  The history is provided by the patient.       Home Medications Prior to Admission medications   Medication Sig Start Date End Date Taking? Authorizing Provider  atorvastatin (LIPITOR) 10 MG tablet Take 1 tablet (10 mg total) by mouth daily. 03/22/23   Carlean Jews, NP  clotrimazole-betamethasone (LOTRISONE) cream Apply 1 Application topically 2 (two) times daily. Patient not taking: Reported on 03/22/2023 10/28/22   Carlean Jews, NP  dapagliflozin propanediol (FARXIGA) 10 MG TABS tablet Take 1 tablet (10 mg total) by mouth daily. 03/22/23   Carlean Jews, NP  DULoxetine (CYMBALTA) 30 MG capsule Take 3 capsules (90 mg total) by mouth daily. 03/22/23   Carlean Jews, NP  hydrochlorothiazide (HYDRODIURIL) 25 MG tablet Take 1 tablet (25 mg total) by mouth daily. 06/30/22   Mayer Masker, PA-C  ibuprofen (ADVIL) 200 MG tablet Take 800 mg by mouth every 8 (eight) hours as needed for headache or moderate pain. Patient not taking: Reported on 06/30/2022    [provider]  insulin glargine-yfgn (SEMGLEE, YFGN,) 100 UNIT/ML Pen Inject 100 Units into the skin at bedtime. 03/22/23   Carlean Jews, NP  Insulin Pen Needle 31G X 8 MM MISC Use as directed Patient not taking: Reported on 06/30/2022 02/24/22   Mayer Masker,  PA-C  lisinopril (ZESTRIL) 5 MG tablet Take 1 tablet (5 mg total) by mouth daily. 03/22/23   Carlean Jews, NP  metFORMIN (GLUCOPHAGE) 1000 MG tablet Take 1 tablet (1,000 mg total) by mouth 2 (two) times daily with a meal. 03/22/23   Carlean Jews, NP  PRESCRIPTION MEDICATION See admin instructions. CPAP- CONTINUOUS Patient not taking: Reported on 06/30/2022    [provider]  tirzepatide [facility]) 7.5 MG/0.5ML Pen Inject 7.5 mg into the skin once a week. 03/22/23   Carlean Jews, NP  trimethoprim-polymyxin b (POLYTRIM) ophthalmic solution Place 1 drop into both eyes every 2 (two) hours. While awake 03/22/23   Carlean Jews, NP      Allergies    Morphine and codeine, Reglan [metoclopramide], Chantix [varenicline], and Toradol [ketorolac tromethamine]    Review of Systems   Review of Systems  All other systems reviewed and are negative.   Physical Exam Updated Vital Signs BP (!) 168/84 (BP Location: Right Arm)   Pulse 84   Temp 98.6 F (37 C) (Oral)   Resp 17   Wt 117.9 kg   SpO2 98%   BMI 43.27 kg/m  Physical Exam Vitals and nursing note reviewed.  Constitutional:      General: She is not in acute distress.    Appearance: She is well-developed. She is not diaphoretic.  HENT:     Head: Normocephalic and atraumatic.  Cardiovascular:     Rate and Rhythm: Normal rate and regular rhythm.  Heart sounds: No murmur heard.    No friction rub. No gallop.  Pulmonary:     Effort: Pulmonary effort is normal. No respiratory distress.     Breath sounds: Normal breath sounds. No wheezing.  Abdominal:     General: Bowel sounds are normal. There is no distension.     Palpations: Abdomen is soft.     Tenderness: There is abdominal tenderness in the epigastric area. There is no right CVA tenderness, left CVA tenderness, guarding or rebound.  Musculoskeletal:        General: Normal range of motion.     Cervical back: Normal range of motion and neck supple.   Skin:    General: Skin is warm and dry.  Neurological:     General: No focal deficit present.     Mental Status: She is alert and oriented to person, place, and time.     ED Results / Procedures / Treatments   Labs (all labs ordered are listed, but only abnormal results are displayed) Labs Reviewed  LIPASE, BLOOD - Abnormal; Notable for the following components:      Result Value   Lipase 53 (*)    All other components within normal limits  COMPREHENSIVE METABOLIC PANEL - Abnormal; Notable for the following components:   Glucose, Bld 174 (*)    Creatinine, Ser 1.08 (*)    AST 14 (*)    All other components within normal limits  CBC - Abnormal; Notable for the following components:   WBC 14.1 (*)    RBC 5.23 (*)    Hemoglobin 16.4 (*)    HCT 47.5 (*)    All other components within normal limits  URINALYSIS, ROUTINE W REFLEX MICROSCOPIC - Abnormal; Notable for the following components:   Color, Urine COLORLESS (*)    Glucose, UA >1,000 (*)    All other components within normal limits    EKG EKG Interpretation Date/Time:  Thursday June 30 2023 01:06:29 EDT Ventricular Rate:  88 PR Interval:  156 QRS Duration:  78 QT Interval:  372 QTC Calculation: 450 R Axis:   8  Text Interpretation: Normal sinus rhythm Normal ECG When compared with ECG of 18-Oct-2016 19:42, no signifcant change is noted Confirmed by Geoffery Lyons ([id number]) on 06/30/2023 1:24:17 AM  Radiology No results found.  Procedures Procedures    Medications Ordered in ED Medications  sodium chloride 0.9 % bolus 1,000 mL (has no administration in time range)  fentaNYL (SUBLIMAZE) injection 50 mcg (has no administration in time range)  ondansetron (ZOFRAN-ODT) disintegrating tablet 4 mg (4 mg Oral Given 06/30/23 0105)    ED Course/ Medical Decision Making/ A&P  Patient is a 47 year old female presenting with epigastric pain and belching as described in the HPI.  Patient arrives here with stable vital  signs and is afebrile.  On exam, she has tenderness to the epigastric region, but exam otherwise unremarkable.  Workup initiated including CBC, CMP, and lipase.  She does have a leukocytosis with white count of 14,000, but laboratory studies are otherwise unremarkable.  Urinalysis is clear.  CT scan of the abdomen and pelvis obtained showing a simple appearing cyst in the left adnexa, but no other acute focal abnormality noted.  Patient has received IV fluids along with fentanyl and Zofran and seems to be feeling better.  Her belching has improved.  I am uncertain as to the exact etiology of her discomfort, however she does have a history of pancreatitis and lipase is minimally elevated at  53.  This could perhaps be an early pancreatitis as I have found no other obvious cause.  At this point, patient to be discharged with pain medication, clear liquids, and follow-up as needed.  Final Clinical Impression(s) / ED Diagnoses Final diagnoses:  None    Rx / DC Orders ED Discharge Orders     None         Geoffery Lyons, MD 06/30/23 [phone]

## 2023-06-30 NOTE — Discharge Instructions (Signed)
Begin taking hydrocodone as prescribed as needed for pain.  Clear liquid diet for the next 24 hours, then slowly advance to normal as tolerated.  Follow-up with primary doctor if not improving in the next few days.

## 2023-06-30 NOTE — ED Triage Notes (Addendum)
Pt in with upper abdominal pain described as "squeezing", also experiencing frequent "sulfur" belching. +nausea, denies vomiting/diarrhea. Hx of pancreatitis

## 2023-07-07 DIAGNOSIS — G4730 Sleep apnea, unspecified: Secondary | ICD-10-CM | POA: Diagnosis not present

## 2023-07-07 DIAGNOSIS — G4733 Obstructive sleep apnea (adult) (pediatric): Secondary | ICD-10-CM | POA: Diagnosis not present

## 2023-07-12 ENCOUNTER — Other Ambulatory Visit (HOSPITAL_COMMUNITY): Payer: Self-pay

## 2023-07-14 ENCOUNTER — Other Ambulatory Visit: Payer: Self-pay

## 2023-07-14 ENCOUNTER — Other Ambulatory Visit (HOSPITAL_COMMUNITY): Payer: Self-pay

## 2023-07-23 ENCOUNTER — Encounter (HOSPITAL_COMMUNITY): Payer: Self-pay | Admitting: Cardiology

## 2023-07-23 ENCOUNTER — Emergency Department (HOSPITAL_COMMUNITY): Payer: 59

## 2023-07-23 ENCOUNTER — Inpatient Hospital Stay (HOSPITAL_COMMUNITY)
Admission: EM | Admit: 2023-07-23 | Discharge: 2023-07-27 | DRG: 322 | Disposition: A | Discharge status: Home / Self Care | Payer: 59 | Attending: Internal Medicine | Admitting: Internal Medicine

## 2023-07-23 ENCOUNTER — Encounter (HOSPITAL_COMMUNITY)
Admission: EM | Disposition: A | Discharge status: Home / Self Care | Payer: Self-pay | Source: Home / Self Care | Attending: Internal Medicine

## 2023-07-23 ENCOUNTER — Other Ambulatory Visit: Payer: Self-pay

## 2023-07-23 DIAGNOSIS — D72829 Elevated white blood cell count, unspecified: Secondary | ICD-10-CM | POA: Diagnosis not present

## 2023-07-23 DIAGNOSIS — E785 Hyperlipidemia, unspecified: Secondary | ICD-10-CM | POA: Diagnosis present

## 2023-07-23 DIAGNOSIS — I214 Non-ST elevation (NSTEMI) myocardial infarction: Secondary | ICD-10-CM | POA: Diagnosis not present

## 2023-07-23 DIAGNOSIS — Z794 Long term (current) use of insulin: Secondary | ICD-10-CM

## 2023-07-23 DIAGNOSIS — I249 Acute ischemic heart disease, unspecified: Secondary | ICD-10-CM | POA: Diagnosis not present

## 2023-07-23 DIAGNOSIS — I2510 Atherosclerotic heart disease of native coronary artery without angina pectoris: Secondary | ICD-10-CM | POA: Diagnosis not present

## 2023-07-23 DIAGNOSIS — Z83438 Family history of other disorder of lipoprotein metabolism and other lipidemia: Secondary | ICD-10-CM

## 2023-07-23 DIAGNOSIS — I10 Essential (primary) hypertension: Secondary | ICD-10-CM | POA: Diagnosis present

## 2023-07-23 DIAGNOSIS — E782 Mixed hyperlipidemia: Secondary | ICD-10-CM | POA: Diagnosis not present

## 2023-07-23 DIAGNOSIS — R072 Precordial pain: Secondary | ICD-10-CM | POA: Diagnosis present

## 2023-07-23 DIAGNOSIS — G4733 Obstructive sleep apnea (adult) (pediatric): Secondary | ICD-10-CM | POA: Diagnosis present

## 2023-07-23 DIAGNOSIS — Z955 Presence of coronary angioplasty implant and graft: Secondary | ICD-10-CM

## 2023-07-23 DIAGNOSIS — F17210 Nicotine dependence, cigarettes, uncomplicated: Secondary | ICD-10-CM

## 2023-07-23 DIAGNOSIS — I25110 Atherosclerotic heart disease of native coronary artery with unstable angina pectoris: Secondary | ICD-10-CM

## 2023-07-23 DIAGNOSIS — Z6841 Body Mass Index (BMI) 40.0 and over, adult: Secondary | ICD-10-CM

## 2023-07-23 DIAGNOSIS — E119 Type 2 diabetes mellitus without complications: Secondary | ICD-10-CM | POA: Diagnosis not present

## 2023-07-23 DIAGNOSIS — K861 Other chronic pancreatitis: Secondary | ICD-10-CM | POA: Diagnosis present

## 2023-07-23 DIAGNOSIS — Z7985 Long-term (current) use of injectable non-insulin antidiabetic drugs: Secondary | ICD-10-CM

## 2023-07-23 DIAGNOSIS — E282 Polycystic ovarian syndrome: Secondary | ICD-10-CM | POA: Diagnosis not present

## 2023-07-23 DIAGNOSIS — R001 Bradycardia, unspecified: Secondary | ICD-10-CM | POA: Diagnosis present

## 2023-07-23 DIAGNOSIS — K219 Gastro-esophageal reflux disease without esophagitis: Secondary | ICD-10-CM | POA: Diagnosis present

## 2023-07-23 DIAGNOSIS — F419 Anxiety disorder, unspecified: Secondary | ICD-10-CM | POA: Diagnosis present

## 2023-07-23 DIAGNOSIS — Z7984 Long term (current) use of oral hypoglycemic drugs: Secondary | ICD-10-CM

## 2023-07-23 DIAGNOSIS — Z833 Family history of diabetes mellitus: Secondary | ICD-10-CM | POA: Diagnosis not present

## 2023-07-23 DIAGNOSIS — Z886 Allergy status to analgesic agent status: Secondary | ICD-10-CM

## 2023-07-23 DIAGNOSIS — K5730 Diverticulosis of large intestine without perforation or abscess without bleeding: Secondary | ICD-10-CM | POA: Diagnosis not present

## 2023-07-23 DIAGNOSIS — N838 Other noninflammatory disorders of ovary, fallopian tube and broad ligament: Secondary | ICD-10-CM | POA: Diagnosis not present

## 2023-07-23 DIAGNOSIS — E1169 Type 2 diabetes mellitus with other specified complication: Secondary | ICD-10-CM | POA: Diagnosis present

## 2023-07-23 DIAGNOSIS — I700 Atherosclerosis of aorta: Secondary | ICD-10-CM | POA: Diagnosis not present

## 2023-07-23 DIAGNOSIS — E66813 Obesity, class 3: Secondary | ICD-10-CM

## 2023-07-23 DIAGNOSIS — I2584 Coronary atherosclerosis due to calcified coronary lesion: Secondary | ICD-10-CM | POA: Diagnosis present

## 2023-07-23 DIAGNOSIS — D751 Secondary polycythemia: Secondary | ICD-10-CM | POA: Diagnosis present

## 2023-07-23 DIAGNOSIS — I499 Cardiac arrhythmia, unspecified: Secondary | ICD-10-CM | POA: Diagnosis not present

## 2023-07-23 DIAGNOSIS — Z79899 Other long term (current) drug therapy: Secondary | ICD-10-CM

## 2023-07-23 DIAGNOSIS — I152 Hypertension secondary to endocrine disorders: Secondary | ICD-10-CM | POA: Diagnosis present

## 2023-07-23 DIAGNOSIS — Z90710 Acquired absence of both cervix and uterus: Secondary | ICD-10-CM | POA: Diagnosis not present

## 2023-07-23 DIAGNOSIS — Z818 Family history of other mental and behavioral disorders: Secondary | ICD-10-CM

## 2023-07-23 DIAGNOSIS — R079 Chest pain, unspecified: Secondary | ICD-10-CM | POA: Diagnosis not present

## 2023-07-23 DIAGNOSIS — F32A Depression, unspecified: Secondary | ICD-10-CM | POA: Diagnosis not present

## 2023-07-23 DIAGNOSIS — R0789 Other chest pain: Secondary | ICD-10-CM | POA: Diagnosis not present

## 2023-07-23 DIAGNOSIS — Z720 Tobacco use: Secondary | ICD-10-CM | POA: Diagnosis not present

## 2023-07-23 DIAGNOSIS — K828 Other specified diseases of gallbladder: Secondary | ICD-10-CM | POA: Diagnosis not present

## 2023-07-23 DIAGNOSIS — E781 Pure hyperglyceridemia: Secondary | ICD-10-CM | POA: Diagnosis not present

## 2023-07-23 DIAGNOSIS — Z888 Allergy status to other drugs, medicaments and biological substances status: Secondary | ICD-10-CM

## 2023-07-23 DIAGNOSIS — R0989 Other specified symptoms and signs involving the circulatory and respiratory systems: Secondary | ICD-10-CM | POA: Diagnosis not present

## 2023-07-23 DIAGNOSIS — Z885 Allergy status to narcotic agent status: Secondary | ICD-10-CM

## 2023-07-23 DIAGNOSIS — G4730 Sleep apnea, unspecified: Secondary | ICD-10-CM | POA: Diagnosis present

## 2023-07-23 HISTORY — DX: Acute ischemic heart disease, unspecified: I24.9

## 2023-07-23 HISTORY — PX: CORONARY STENT INTERVENTION: CATH118234

## 2023-07-23 HISTORY — PX: LEFT HEART CATH AND CORONARY ANGIOGRAPHY: CATH118249

## 2023-07-23 HISTORY — DX: Non-ST elevation (NSTEMI) myocardial infarction: I21.4

## 2023-07-23 LAB — CBC WITH DIFFERENTIAL/PLATELET
Abs Immature Granulocytes: 0.11 10*3/uL — ABNORMAL HIGH (ref 0.00–0.07)
Basophils Absolute: 0.1 10*3/uL (ref 0.0–0.1)
Basophils Relative: 1 %
Eosinophils Absolute: 0.6 10*3/uL — ABNORMAL HIGH (ref 0.0–0.5)
Eosinophils Relative: 5 %
HCT: 46.6 % — ABNORMAL HIGH (ref 36.0–46.0)
Hemoglobin: 16 g/dL — ABNORMAL HIGH (ref 12.0–15.0)
Immature Granulocytes: 1 %
Lymphocytes Relative: 24 %
Lymphs Abs: 2.5 10*3/uL (ref 0.7–4.0)
MCH: 30.7 pg (ref 26.0–34.0)
MCHC: 34.3 g/dL (ref 30.0–36.0)
MCV: 89.3 fL (ref 80.0–100.0)
Monocytes Absolute: 0.8 10*3/uL (ref 0.1–1.0)
Monocytes Relative: 7 %
Neutro Abs: 6.6 10*3/uL (ref 1.7–7.7)
Neutrophils Relative %: 62 %
Platelets: 175 10*3/uL (ref 150–400)
RBC: 5.22 MIL/uL — ABNORMAL HIGH (ref 3.87–5.11)
RDW: 13.5 % (ref 11.5–15.5)
WBC: 10.7 10*3/uL — ABNORMAL HIGH (ref 4.0–10.5)
nRBC: 0 % (ref 0.0–0.2)

## 2023-07-23 LAB — TROPONIN I (HIGH SENSITIVITY)
Troponin I (High Sensitivity): 27 ng/L — ABNORMAL HIGH (ref <18)
Troponin I (High Sensitivity): 74 ng/L — ABNORMAL HIGH (ref <18)
Troponin I (High Sensitivity): 296 ng/L (ref <18)
Troponin I (High Sensitivity): 900 ng/L (ref <18)
Troponin I (High Sensitivity): 3410 ng/L (ref <18)

## 2023-07-23 LAB — POCT ACTIVATED CLOTTING TIME: Activated Clotting Time: 385 s

## 2023-07-23 LAB — COMPREHENSIVE METABOLIC PANEL
ALT: 12 U/L (ref 0–44)
AST: 21 U/L (ref 15–41)
Albumin: 3.6 g/dL (ref 3.5–5.0)
Alkaline Phosphatase: 50 U/L (ref 38–126)
Anion gap: 16 — ABNORMAL HIGH (ref 5–15)
BUN: 20 mg/dL (ref 6–20)
CO2: 24 mmol/L (ref 22–32)
Calcium: 9.5 mg/dL (ref 8.9–10.3)
Chloride: 97 mmol/L — ABNORMAL LOW (ref 98–111)
Creatinine, Ser: 0.93 mg/dL (ref 0.44–1.00)
GFR, Estimated: >60 mL/min (ref >60)
Glucose, Bld: 254 mg/dL — ABNORMAL HIGH (ref 70–99)
Potassium: 3.7 mmol/L (ref 3.5–5.1)
Sodium: 137 mmol/L (ref 135–145)
Total Bilirubin: 1.1 mg/dL (ref 0.3–1.2)
Total Protein: 6.5 g/dL (ref 6.5–8.1)

## 2023-07-23 LAB — ECHOCARDIOGRAM COMPLETE
AR max vel: 2.39 cm2
AV Mean grad: 4 mm[Hg]
AV Peak grad: 8.2 mm[Hg]
Ao pk vel: 1.43 m/s
Area-P 1/2: 3.6 cm2
Calc EF: 76.6 %
Height: 65 in
S' Lateral: 2.6 cm
Single Plane A2C EF: 82.1 %
Single Plane A4C EF: 68.8 %
Weight: 4160 [oz_av]

## 2023-07-23 LAB — HIV ANTIBODY (ROUTINE TESTING W REFLEX): HIV Screen 4th Generation wRfx: NONREACTIVE

## 2023-07-23 LAB — LIPID PANEL
Cholesterol: 180 mg/dL (ref 0–200)
HDL: 28 mg/dL — ABNORMAL LOW (ref >40)
LDL Cholesterol: 101 mg/dL — ABNORMAL HIGH (ref 0–99)
Total CHOL/HDL Ratio: 6.4 {ratio}
Triglycerides: 253 mg/dL — ABNORMAL HIGH (ref <150)
VLDL: 51 mg/dL — ABNORMAL HIGH (ref 0–40)

## 2023-07-23 LAB — LDL CHOLESTEROL, DIRECT: Direct LDL: 123 mg/dL — ABNORMAL HIGH (ref 0–99)

## 2023-07-23 LAB — LIPASE, BLOOD: Lipase: 40 U/L (ref 11–51)

## 2023-07-23 LAB — MRSA NEXT GEN BY PCR, NASAL: MRSA by PCR Next Gen: NOT DETECTED

## 2023-07-23 LAB — HEPARIN LEVEL (UNFRACTIONATED): Heparin Unfractionated: <0.1 [IU]/mL — ABNORMAL LOW (ref 0.30–0.70)

## 2023-07-23 LAB — C-REACTIVE PROTEIN: CRP: 1.1 mg/dL — ABNORMAL HIGH (ref <1.0)

## 2023-07-23 LAB — HEMOGLOBIN A1C
Hgb A1c MFr Bld: 6.5 % — ABNORMAL HIGH (ref 4.8–5.6)
Mean Plasma Glucose: 139.85 mg/dL

## 2023-07-23 LAB — SEDIMENTATION RATE: Sed Rate: 3 mm/h (ref 0–22)

## 2023-07-23 SURGERY — LEFT HEART CATH AND CORONARY ANGIOGRAPHY
Anesthesia: LOCAL

## 2023-07-23 MED ORDER — NITROGLYCERIN 0.4 MG SL SUBL
0.4000 mg | SUBLINGUAL_TABLET | SUBLINGUAL | Status: DC | PRN
  Administered 2023-07-23 (×2): 0.4 mg via SUBLINGUAL

## 2023-07-23 MED ORDER — TIROFIBAN HCL IN NACL 5-0.9 MG/100ML-% IV SOLN
0.1500 ug/kg/min | INTRAVENOUS | Status: AC
  Administered 2023-07-23: 0.15 ug/kg/min via INTRAVENOUS
  Filled 2023-07-23: qty 100

## 2023-07-23 MED ORDER — HEPARIN BOLUS VIA INFUSION
2500.0000 [IU] | Freq: Once | INTRAVENOUS | Status: AC
  Administered 2023-07-23: 2500 [IU] via INTRAVENOUS
  Filled 2023-07-23: qty 2500

## 2023-07-23 MED ORDER — TIROFIBAN HCL IN NACL 5-0.9 MG/100ML-% IV SOLN
INTRAVENOUS | Status: AC
  Filled 2023-07-23: qty 100

## 2023-07-23 MED ORDER — MIDAZOLAM HCL 2 MG/2ML IJ SOLN
INTRAMUSCULAR | Status: AC
  Filled 2023-07-23: qty 2

## 2023-07-23 MED ORDER — SODIUM CHLORIDE 0.9% FLUSH: 3.0000 mL | INTRAVENOUS | Status: DC | PRN

## 2023-07-23 MED ORDER — HEPARIN (PORCINE) IN NACL 1000-0.9 UT/500ML-% IV SOLN
INTRAVENOUS | Status: DC | PRN
  Administered 2023-07-23 (×2): 500 mL

## 2023-07-23 MED ORDER — HEPARIN BOLUS VIA INFUSION
4000.0000 [IU] | Freq: Once | INTRAVENOUS | Status: AC
  Administered 2023-07-23: 4000 [IU] via INTRAVENOUS
  Filled 2023-07-23: qty 4000

## 2023-07-23 MED ORDER — SODIUM CHLORIDE 0.9 % IV SOLN: 250.0000 mL | INTRAVENOUS | Status: AC | PRN

## 2023-07-23 MED ORDER — NITROGLYCERIN IN D5W 200-5 MCG/ML-% IV SOLN
0.0000 ug/min | INTRAVENOUS | Status: DC
  Administered 2023-07-23: 5 ug/min via INTRAVENOUS
  Filled 2023-07-23: qty 250

## 2023-07-23 MED ORDER — ATORVASTATIN CALCIUM 80 MG PO TABS
80.0000 mg | ORAL_TABLET | Freq: Every day | ORAL | Status: DC
  Administered 2023-07-23 – 2023-07-27 (×5): 80 mg via ORAL
  Filled 2023-07-23: qty 2
  Filled 2023-07-23 (×4): qty 1

## 2023-07-23 MED ORDER — IOHEXOL 350 MG/ML SOLN
75.0000 mL | Freq: Once | INTRAVENOUS | Status: AC | PRN
  Administered 2023-07-23: 75 mL via INTRAVENOUS

## 2023-07-23 MED ORDER — FENTANYL CITRATE (PF) 100 MCG/2ML IJ SOLN
INTRAMUSCULAR | Status: DC | PRN
  Administered 2023-07-23 (×2): 25 ug via INTRAVENOUS

## 2023-07-23 MED ORDER — HEPARIN SODIUM (PORCINE) 1000 UNIT/ML IJ SOLN
INTRAMUSCULAR | Status: AC
  Filled 2023-07-23: qty 10

## 2023-07-23 MED ORDER — NITROGLYCERIN 2 % TD OINT
1.0000 [in_us] | TOPICAL_OINTMENT | Freq: Once | TRANSDERMAL | Status: AC
  Administered 2023-07-23: 1 [in_us] via TOPICAL
  Filled 2023-07-23: qty 1

## 2023-07-23 MED ORDER — ONDANSETRON HCL 4 MG/2ML IJ SOLN
4.0000 mg | Freq: Once | INTRAMUSCULAR | Status: AC
  Administered 2023-07-23: 4 mg via INTRAVENOUS
  Filled 2023-07-23: qty 2

## 2023-07-23 MED ORDER — DULOXETINE HCL 60 MG PO CPEP
90.0000 mg | ORAL_CAPSULE | Freq: Every day | ORAL | Status: DC
  Administered 2023-07-23 – 2023-07-27 (×5): 90 mg via ORAL
  Filled 2023-07-23 (×2): qty 1
  Filled 2023-07-23: qty 3
  Filled 2023-07-23 (×2): qty 1

## 2023-07-23 MED ORDER — TICAGRELOR 90 MG PO TABS
ORAL_TABLET | ORAL | Status: DC | PRN
  Administered 2023-07-23: 180 mg via ORAL

## 2023-07-23 MED ORDER — NITROGLYCERIN 1 MG/10 ML FOR IR/CATH LAB
INTRA_ARTERIAL | Status: DC | PRN
  Administered 2023-07-23: 200 ug via INTRACORONARY

## 2023-07-23 MED ORDER — LIDOCAINE HCL (PF) 1 % IJ SOLN
INTRAMUSCULAR | Status: DC | PRN
  Administered 2023-07-23: 2 mL via INTRADERMAL

## 2023-07-23 MED ORDER — ACETAMINOPHEN 325 MG PO TABS: 650.0000 mg | ORAL_TABLET | Freq: Four times a day (QID) | ORAL | Status: DC | PRN

## 2023-07-23 MED ORDER — HYDROMORPHONE HCL 1 MG/ML IJ SOLN: 1.0000 mg | Freq: Once | INTRAMUSCULAR | Status: DC

## 2023-07-23 MED ORDER — NICOTINE 21 MG/24HR TD PT24
21.0000 mg | MEDICATED_PATCH | Freq: Every day | TRANSDERMAL | Status: DC
  Administered 2023-07-23 – 2023-07-27 (×5): 21 mg via TRANSDERMAL
  Filled 2023-07-23 (×5): qty 1

## 2023-07-23 MED ORDER — MELATONIN 3 MG PO TABS
3.0000 mg | ORAL_TABLET | Freq: Every day | ORAL | Status: DC
  Administered 2023-07-23 – 2023-07-26 (×4): 3 mg via ORAL
  Filled 2023-07-23 (×4): qty 1

## 2023-07-23 MED ORDER — CHLORHEXIDINE GLUCONATE CLOTH 2 % EX PADS
6.0000 | MEDICATED_PAD | Freq: Every day | CUTANEOUS | Status: DC
  Administered 2023-07-23 – 2023-07-24 (×2): 6 via TOPICAL

## 2023-07-23 MED ORDER — HEPARIN SODIUM (PORCINE) 1000 UNIT/ML IJ SOLN
INTRAMUSCULAR | Status: DC | PRN
  Administered 2023-07-23: 8000 [IU] via INTRAVENOUS

## 2023-07-23 MED ORDER — SODIUM CHLORIDE 0.9 % IV SOLN: INTRAVENOUS | Status: AC

## 2023-07-23 MED ORDER — ONDANSETRON HCL 4 MG/2ML IJ SOLN
INTRAMUSCULAR | Status: AC
  Filled 2023-07-23: qty 2

## 2023-07-23 MED ORDER — HYDROMORPHONE HCL 1 MG/ML IJ SOLN
1.0000 mg | Freq: Once | INTRAMUSCULAR | Status: AC
  Administered 2023-07-23: 1 mg via INTRAVENOUS
  Filled 2023-07-23: qty 1

## 2023-07-23 MED ORDER — VERAPAMIL HCL 2.5 MG/ML IV SOLN
INTRAVENOUS | Status: DC | PRN
  Administered 2023-07-23: 10 mL via INTRA_ARTERIAL

## 2023-07-23 MED ORDER — NITROGLYCERIN 1 MG/10 ML FOR IR/CATH LAB
INTRA_ARTERIAL | Status: AC
  Filled 2023-07-23: qty 10

## 2023-07-23 MED ORDER — ONDANSETRON HCL 4 MG/2ML IJ SOLN: 4.0000 mg | Freq: Four times a day (QID) | INTRAMUSCULAR | Status: DC | PRN

## 2023-07-23 MED ORDER — ALBUTEROL SULFATE (2.5 MG/3ML) 0.083% IN NEBU: 2.5000 mg | INHALATION_SOLUTION | Freq: Four times a day (QID) | RESPIRATORY_TRACT | Status: DC | PRN

## 2023-07-23 MED ORDER — PERFLUTREN LIPID MICROSPHERE
1.0000 mL | INTRAVENOUS | Status: AC | PRN
  Administered 2023-07-23: 4 mL via INTRAVENOUS

## 2023-07-23 MED ORDER — PANTOPRAZOLE SODIUM 40 MG IV SOLR
40.0000 mg | Freq: Once | INTRAVENOUS | Status: AC
  Administered 2023-07-23: 40 mg via INTRAVENOUS
  Filled 2023-07-23: qty 10

## 2023-07-23 MED ORDER — FENTANYL CITRATE (PF) 100 MCG/2ML IJ SOLN
INTRAMUSCULAR | Status: AC
  Filled 2023-07-23: qty 2

## 2023-07-23 MED ORDER — INSULIN GLARGINE-YFGN 100 UNIT/ML ~~LOC~~ SOLN
50.0000 [IU] | Freq: Every day | SUBCUTANEOUS | Status: DC
  Administered 2023-07-23 – 2023-07-26 (×4): 50 [IU] via SUBCUTANEOUS
  Filled 2023-07-23 (×6): qty 0.5

## 2023-07-23 MED ORDER — EZETIMIBE 10 MG PO TABS
10.0000 mg | ORAL_TABLET | Freq: Every day | ORAL | Status: DC
  Administered 2023-07-23 – 2023-07-27 (×5): 10 mg via ORAL
  Filled 2023-07-23 (×5): qty 1

## 2023-07-23 MED ORDER — TICAGRELOR 90 MG PO TABS
90.0000 mg | ORAL_TABLET | Freq: Two times a day (BID) | ORAL | Status: DC
  Administered 2023-07-24 – 2023-07-27 (×7): 90 mg via ORAL
  Filled 2023-07-23 (×7): qty 1

## 2023-07-23 MED ORDER — TICAGRELOR 90 MG PO TABS
ORAL_TABLET | ORAL | Status: AC
  Filled 2023-07-23: qty 2

## 2023-07-23 MED ORDER — SODIUM CHLORIDE 0.9 % IV SOLN
INTRAVENOUS | Status: AC | PRN
  Administered 2023-07-23: 50 mL/h via INTRAVENOUS

## 2023-07-23 MED ORDER — TIROFIBAN HCL IN NACL 5-0.9 MG/100ML-% IV SOLN
INTRAVENOUS | Status: AC | PRN
  Administered 2023-07-23: .15 ug/kg/min via INTRAVENOUS

## 2023-07-23 MED ORDER — DAPAGLIFLOZIN PROPANEDIOL 10 MG PO TABS
10.0000 mg | ORAL_TABLET | Freq: Every day | ORAL | Status: DC
  Administered 2023-07-23 – 2023-07-27 (×5): 10 mg via ORAL
  Filled 2023-07-23 (×5): qty 1

## 2023-07-23 MED ORDER — LIDOCAINE HCL (PF) 1 % IJ SOLN
INTRAMUSCULAR | Status: AC
  Filled 2023-07-23: qty 30

## 2023-07-23 MED ORDER — ACETAMINOPHEN 650 MG RE SUPP: 650.0000 mg | Freq: Four times a day (QID) | RECTAL | Status: DC | PRN

## 2023-07-23 MED ORDER — FENOFIBRATE 160 MG PO TABS
160.0000 mg | ORAL_TABLET | Freq: Every day | ORAL | Status: DC
  Administered 2023-07-23 – 2023-07-27 (×5): 160 mg via ORAL
  Filled 2023-07-23 (×5): qty 1

## 2023-07-23 MED ORDER — LABETALOL HCL 5 MG/ML IV SOLN
10.0000 mg | INTRAVENOUS | Status: AC | PRN
  Filled 2023-07-23: qty 4

## 2023-07-23 MED ORDER — ONDANSETRON HCL 4 MG PO TABS: 4.0000 mg | ORAL_TABLET | Freq: Four times a day (QID) | ORAL | Status: DC | PRN

## 2023-07-23 MED ORDER — IOHEXOL 350 MG/ML SOLN
INTRAVENOUS | Status: DC | PRN
  Administered 2023-07-23: 105 mL

## 2023-07-23 MED ORDER — METOPROLOL TARTRATE 25 MG PO TABS
25.0000 mg | ORAL_TABLET | Freq: Two times a day (BID) | ORAL | Status: DC
  Administered 2023-07-23 – 2023-07-27 (×9): 25 mg via ORAL
  Filled 2023-07-23 (×9): qty 1

## 2023-07-23 MED ORDER — ONDANSETRON HCL 4 MG/2ML IJ SOLN
4.0000 mg | Freq: Four times a day (QID) | INTRAMUSCULAR | Status: DC | PRN
  Administered 2023-07-23: 4 mg via INTRAVENOUS
  Filled 2023-07-23: qty 2

## 2023-07-23 MED ORDER — HYDRALAZINE HCL 20 MG/ML IJ SOLN
10.0000 mg | INTRAMUSCULAR | Status: AC | PRN
  Administered 2023-07-23: 10 mg via INTRAVENOUS
  Filled 2023-07-23: qty 1

## 2023-07-23 MED ORDER — SODIUM CHLORIDE 0.9 % WEIGHT BASED INFUSION: 1.0000 mL/kg/h | INTRAVENOUS | Status: DC

## 2023-07-23 MED ORDER — ONDANSETRON HCL 4 MG/2ML IJ SOLN
INTRAMUSCULAR | Status: DC | PRN
  Administered 2023-07-23: 4 mg via INTRAVENOUS

## 2023-07-23 MED ORDER — TIROFIBAN (AGGRASTAT) BOLUS VIA INFUSION
INTRAVENOUS | Status: DC | PRN
  Administered 2023-07-23: 2947.5 ug via INTRAVENOUS

## 2023-07-23 MED ORDER — ACETAMINOPHEN 325 MG PO TABS
650.0000 mg | ORAL_TABLET | ORAL | Status: DC | PRN
  Administered 2023-07-24 – 2023-07-27 (×8): 650 mg via ORAL
  Filled 2023-07-23 (×8): qty 2

## 2023-07-23 MED ORDER — FENTANYL CITRATE PF 50 MCG/ML IJ SOSY
50.0000 ug | PREFILLED_SYRINGE | Freq: Once | INTRAMUSCULAR | Status: AC
  Administered 2023-07-23: 50 ug via INTRAVENOUS
  Filled 2023-07-23: qty 1

## 2023-07-23 MED ORDER — SODIUM CHLORIDE 0.9% FLUSH
3.0000 mL | Freq: Two times a day (BID) | INTRAVENOUS | Status: DC
  Administered 2023-07-24 – 2023-07-26 (×3): 3 mL via INTRAVENOUS

## 2023-07-23 MED ORDER — SODIUM CHLORIDE 0.9% FLUSH
3.0000 mL | Freq: Two times a day (BID) | INTRAVENOUS | Status: DC
  Administered 2023-07-23 (×2): 3 mL via INTRAVENOUS

## 2023-07-23 MED ORDER — SODIUM CHLORIDE 0.9 % WEIGHT BASED INFUSION: 3.0000 mL/kg/h | INTRAVENOUS | Status: DC

## 2023-07-23 MED ORDER — VERAPAMIL HCL 2.5 MG/ML IV SOLN
INTRAVENOUS | Status: AC
  Filled 2023-07-23: qty 2

## 2023-07-23 MED ORDER — ASPIRIN 81 MG PO TBEC
81.0000 mg | DELAYED_RELEASE_TABLET | Freq: Every day | ORAL | Status: DC
  Administered 2023-07-24 – 2023-07-27 (×4): 81 mg via ORAL
  Filled 2023-07-23 (×4): qty 1

## 2023-07-23 MED ORDER — ASPIRIN 81 MG PO CHEW: 81.0000 mg | CHEWABLE_TABLET | ORAL | Status: DC

## 2023-07-23 MED ORDER — HEPARIN (PORCINE) 25000 UT/250ML-% IV SOLN
1400.0000 [IU]/h | INTRAVENOUS | Status: DC
  Administered 2023-07-23: 1100 [IU]/h via INTRAVENOUS
  Filled 2023-07-23: qty 250

## 2023-07-23 MED ORDER — MIDAZOLAM HCL 2 MG/2ML IJ SOLN
INTRAMUSCULAR | Status: DC | PRN
  Administered 2023-07-23: 1 mg via INTRAVENOUS

## 2023-07-23 MED ORDER — SODIUM CHLORIDE 0.9 % IV SOLN
12.5000 mg | Freq: Four times a day (QID) | INTRAVENOUS | Status: DC | PRN
  Administered 2023-07-23: 12.5 mg via INTRAVENOUS
  Filled 2023-07-23: qty 12.5

## 2023-07-23 SURGICAL SUPPLY — 16 items
BALLN EMERGE MR 2.5X12 (BALLOONS) ×1
BALLN ~~LOC~~ EUPHORA RX 3.25X8 (BALLOONS) ×1
BALLOON EMERGE MR 2.5X12 (BALLOONS) IMPLANT
BALLOON ~~LOC~~ EUPHORA RX 3.25X8 (BALLOONS) IMPLANT
CATH 5FR JL3.5 JR4 ANG PIG MP (CATHETERS) IMPLANT
CATH VISTA GUIDE 6FR XB3.5 (CATHETERS) IMPLANT
DEVICE RAD COMP TR BAND LRG (VASCULAR PRODUCTS) IMPLANT
GLIDESHEATH SLEND SS 6F .021 (SHEATH) IMPLANT
GUIDEWIRE INQWIRE 1.5J.035X260 (WIRE) IMPLANT
INQWIRE 1.5J .035X260CM (WIRE) ×1
KIT ENCORE 26 ADVANTAGE (KITS) IMPLANT
PACK CARDIAC CATHETERIZATION (CUSTOM PROCEDURE TRAY) ×1 IMPLANT
SET ATX-X65L (MISCELLANEOUS) IMPLANT
STENT SYNERGY XD 3.0X12 (Permanent Stent) IMPLANT
SYNERGY XD 3.0X12 (Permanent Stent) ×1 IMPLANT
WIRE ASAHI PROWATER 180CM (WIRE) IMPLANT

## 2023-07-23 NOTE — H&P (Signed)
History and Physical    Patient: Renee Ho WUJ:[id number] DOB: 01/25/1976 DOA: 07/23/2023 DOS: the patient was seen and examined on 07/23/2023 PCP: Melida Quitter, PA  Patient coming from: Home via EMS  Chief Complaint:  Chief Complaint  Patient presents with   Chest Pain   HPI: Tauri Fandrich is a 47 y.o. female with medical history significant of hypertension, hyperlipidemia, diabetes mellitus type 2, depression, PCOS, tobacco abuse, sleep apnea on CPAP, morbid obesity who presented with complaints of chest pain.  Symptoms started yesterday around noon. Pain was located substernally.  She describes it as a stabbing sharp pain with pressure.  She had some nausea and was belching.  Symptoms were initially mild and she questioned if possibly secondary to indigestion.  However, patient reported that while walking in the grocery store later on that evening having to stop multiple times due to worsening pain and knee to catch her breath.  Pain radiated under her right breast, arm, and jaw.  She makes note that she had tried taking Prilosec, Tums, and drinking milk prior to going to sleep without any relief.  She makes note that better around 2 AM this morning pain was severe 10 out of 10 on the pain scale for which she called 911.  Denies having any fever, vomiting, cough, leg swelling, dysuria, or calf pain.  Patient does admit to smoking a pack of cigarettes per day on average for over 30 years.  To her knowledge she does not have any significant family history of heart disease.  In route with EMS patient had been given full dose aspirin and 0.4 mg of nitroglycerin sublingual.    In the emergency department patient was noted to be afebrile with blood pressures elevated up to 164/78, and all other vital signs maintained. EKG noted ST depressions in the anterior leads. Labs significant for WBC 10.7, hemoglobin 16, CO2 24, glucose 254, anion gap 16, and high-sensitivity  troponin 27-> 74-> 296.  Chest ray and noted low lung volumes.  CT angiogram of the chest abdomen pelvis revealed large complex appearing left ovary measuring 6.9 x 3.4 x 4.5 cm for which ultrasound recommended to rule out ovarian malignancy and aortic atherosclerosis with three-vessel coronary artery disease appreciated.  Cardiology had been formally consulted.  Patient had been given Zofran, started on heparin drip, and multiple IV pain medication without improvement in symptoms.  Patient does make note that pain has decreased after nitroglycerin drip have been started from a 10 down to a 5.  Review of Systems: As mentioned in the history of present illness. All other systems reviewed and are negative. Past Medical History:  Diagnosis Date   Depression    Diabetes mellitus    Diabetes mellitus without complication (HCC)    Diverticula of intestine    High cholesterol    Hypertension    OSA on CPAP    CPAP pressure= 15   Polycystic ovarian syndrome    Sleep apnea    on CPAP with pressure setting= 15   Past Surgical History:  Procedure Laterality Date   ABDOMINAL HYSTERECTOMY     CYST REMOVAL NECK     KNEE ARTHROSCOPY  07/11/2012   Procedure: ARTHROSCOPY KNEE;  Surgeon: Cammy Copa, MD;  Location: [facility] OR;  Service: Orthopedics;  Laterality: Right;  Right knee arthroscopy with debridement   KNEE SURGERY     OVARIAN CYST REMOVAL     WISDOM TOOTH EXTRACTION  05/11/2017   Social History:  reports that she has been smoking cigarettes. She has a 10 pack-year smoking history. She has never used smokeless tobacco. She reports that she does not drink alcohol and does not use drugs.  Allergies  Allergen Reactions   Morphine And Codeine Other (See Comments)    Migraines   Reglan [Metoclopramide] Other (See Comments)    Psychotic episodes   Chantix [Varenicline] Other (See Comments)    Makes the patient angry feeling   Toradol [Ketorolac Tromethamine] Rash    Family History  Problem  Relation Age of Onset   Cancer Mother        breast   Mental illness Mother    Depression Mother    Hyperlipidemia Mother    Breast cancer Mother    Healthy Father    Hyperlipidemia Father    Diabetes Maternal Grandmother    Hyperlipidemia Maternal Grandmother    Cancer Maternal Grandfather        unknown type   Depression Maternal Grandfather    Hyperlipidemia Maternal Grandfather    Stroke Paternal Grandmother    Hyperlipidemia Paternal Grandmother    Hyperlipidemia Paternal Grandfather    Sleep apnea Neg Hx     Prior to Admission medications   Medication Sig Start Date End Date Taking? Authorizing Provider  atorvastatin (LIPITOR) 10 MG tablet Take 1 tablet (10 mg total) by mouth daily. 03/22/23  Yes Carlean Jews, NP  dapagliflozin propanediol (FARXIGA) 10 MG TABS tablet Take 1 tablet (10 mg total) by mouth daily. 03/22/23  Yes Boscia, Kathlynn Grate, NP  DULoxetine (CYMBALTA) 30 MG capsule Take 3 capsules (90 mg total) by mouth daily. 03/22/23  Yes Boscia, Kathlynn Grate, NP  hydrochlorothiazide (HYDRODIURIL) 25 MG tablet Take 1 tablet (25 mg total) by mouth daily. 06/30/22  Yes Abonza, Maritza, PA-C  ibuprofen (ADVIL) 200 MG tablet Take 800 mg by mouth every 8 (eight) hours as needed for headache or moderate pain.   Yes [provider]  insulin glargine-yfgn (SEMGLEE, YFGN,) 100 UNIT/ML Pen Inject 100 Units into the skin at bedtime. 03/22/23  Yes Boscia, Heather E, NP  lisinopril (ZESTRIL) 5 MG tablet Take 1 tablet (5 mg total) by mouth daily. 03/22/23  Yes Carlean Jews, NP  metFORMIN (GLUCOPHAGE) 1000 MG tablet Take 1 tablet (1,000 mg total) by mouth 2 (two) times daily with a meal. 03/22/23  Yes Boscia, Kathlynn Grate, NP  PRESCRIPTION MEDICATION See admin instructions. CPAP- CONTINUOUS   Yes [provider]  tirzepatide (MOUNJARO) 7.5 MG/0.5ML Pen Inject 7.5 mg into the skin once a week. 03/22/23  Yes Boscia, Kathlynn Grate, NP  clotrimazole-betamethasone (LOTRISONE) cream  Apply 1 Application topically 2 (two) times daily. Patient not taking: Reported on 03/22/2023 10/28/22   Carlean Jews, NP  HYDROcodone-acetaminophen (NORCO) 5-325 MG tablet Take 1-2 tablets by mouth every 6 (six) hours as needed. Patient not taking: Reported on 07/23/2023 06/30/23   Geoffery Lyons, MD  Insulin Pen Needle 31G X 8 MM MISC Use as directed Patient not taking: Reported on 06/30/2022 02/24/22   Mayer Masker, PA-C    Physical Exam: Vitals:   07/23/23 0345 07/23/23 0445 07/23/23 0545 07/23/23 0645  BP: (!) 150/72 (!) 147/82 (!) 149/70   Pulse: 71 70 75   Resp: 11 14 17    Temp:    98.5 F (36.9 C)  TempSrc:    Oral  SpO2: 93% 95% 96%   Weight:      Height:       Constitutional: Middle-aged female  who appears to be in some discomfort, but able to follow commands Eyes: PERRL, lids and conjunctivae normal ENMT: Mucous membranes are moist.  Fair dentition. Neck: normal, supple,  Respiratory: clear to auscultation bilaterally, no wheezing, no crackles. Normal respiratory effort.  Currently on 2 L of nasal cannula oxygen. Cardiovascular: Regular rate and rhythm, no murmurs / rubs / gallops. No extremity edema. 2+ pedal pulses.   Abdomen: Protuberant abdomen, no tenderness, no masses palpated.  Bowel sounds positive.  Musculoskeletal: no clubbing / cyanosis. No joint deformity upper and lower extremities. Good ROM, no contractures. Normal muscle tone.  Skin: no rashes, lesions, ulcers. No induration Neurologic: CN 2-12 grossly intact.  Strength 5/5 in all 4.  Psychiatric: Normal judgment and insight. Alert and oriented x 3. Normal mood.   Data Reviewed:  EKG revealed a sinus rhythm with ST depressions noted in V2.  Reviewed labs, imaging, and pertinent records as documented.   Assessment and Plan:  NSTEMI Acute.  Patient presented with complaints of substernal chest pain with radiation to the right chest, arm, and jaw.  She had been given full dose aspirin and  nitroglycerin.  High-sensitivity troponin 27-> 74-> 296.  Patient was placed on a nitroglycerin drip with improvement in pain from 10 down to a 5.  CRP was noted to be mildly elevated at 1.1.  Echocardiogram has been ordered. -Admit to a progressive bed -Nasal cannula oxygen -Continue heparin and nitroglycerin drips -N.p.o. for possible need of cardiac cath -Continue to trend cardiac troponin -Follow-up echocardiogram -Cardiology consulted, will follow-up for any further recommendations.  Leukocytosis WBC elevated at 10.7 with elevated absolute eosinophils and immature granulocytes. Appears lower than when noted to be 14.1 on 9/19.  Patient otherwise noted to be afebrile.  He reactive to above. -Continue to monitor  Dyslipidemia Hypertriglyceridemia Home medication regimen includes atorvastatin 10 mg and Zetia.  Patient was increased to atorvastatin 40 mg daily and she was started on Zetia milligrams p.o. -Check lipid panel -Continue Zetia and increased dose of atorvastatin  Essential hypertension Blood pressures were initially elevated up to 164/78. -Held hydrochlorothiazide and lisinopril due to possible need of cardiac cath -Resume home blood pressure regimen when medically appropriate.  Diabetes mellitus type 2, with long-term use of insulin On admission glucose elevated up to 254.  Home medication regimen includes Semglee 100 units nightly, Mounjaro.  However, patient notes that she did not take the medication last night. -Hypoglycemic protocols -Check hemoglobin A1c -Continue Farxiga -Held metformin -Reduce Semglee 50 units as patient currently NPO. -CBGs before every meal with moderate SSI  Polycythemia Chronic.  Hemoglobin 16 which appears similar to priors.  Thought secondary to patient's history of tobacco abuse and/or obstructive sleep apnea. -Continue to monitor  History of PCOS Abnormal left ovary Acute.  Patient makes note that she has history of polycystic  ovarian syndrome for which she had a hysterectomy.  On the CT scan patient was noted to have enlarged left ovary measuring 6.9 x 3.4 x 4.5 cm.  Possibly secondary to history of PCOS, but question possibility of underlying malignancy. -Will need to check pelvic ultrasound once patient acute issues are addressed  Depression -Continue Cymbalta  Tobacco abuse Patient reports having at least a 30 smoking pack year history of tobacco. -Nicotine patch offered  OSA Patient on CPAP at night with home settings of 15. -Continue CPAP  Morbid obesity BMI of 43.27 kg/m.  Patient reports being on Mounjaro. -Continue Mounjaro in the outpatient setting  DVT prophylaxis: Heparin  Advance Care Planning:   Code Status: Full Code    Consults: Cardiology  Family Communication: None requested  Severity of Illness: The appropriate patient status for this patient is OBSERVATION. Observation status is judged to be reasonable and necessary in order to provide the required intensity of service to ensure the patient's safety. The patient's presenting symptoms, physical exam findings, and initial radiographic and laboratory data in the context of their medical condition is felt to place them at decreased risk for further clinical deterioration. Furthermore, it is anticipated that the patient will be medically stable for discharge from the hospital within 2 midnights of admission.   Author: Clydie Braun, MD 07/23/2023 7:51 AM  For on call review [email or web address].

## 2023-07-23 NOTE — Progress Notes (Signed)
Echocardiogram 2D Echocardiogram has been performed.  Nicole Defino N Ori Kreiter,RDCS 07/23/2023, 8:42 AM

## 2023-07-23 NOTE — Progress Notes (Signed)
PHARMACY - ANTICOAGULATION CONSULT NOTE  Pharmacy Consult for IV heparin Indication: chest pain/ACS  Allergies  Allergen Reactions   Morphine And Codeine Other (See Comments)    Migraines   Reglan [Metoclopramide] Other (See Comments)    Psychotic episodes   Chantix [Varenicline] Other (See Comments)    Makes the patient angry feeling   Toradol [Ketorolac Tromethamine] Rash    Patient Measurements: Height: 5\' 5"  (165.1 cm) Weight: 117.9 kg (260 lb) IBW/kg (Calculated) : 57 Heparin Dosing Weight: 85.3 kg  Vital Signs: Temp: 98.5 F (36.9 C) (10/12 0259) Temp Source: Oral (10/12 0259) BP: 149/70 (10/12 0545) Pulse Rate: 75 (10/12 0545)  Labs: Recent Labs    07/23/23 0310 07/23/23 0502  HGB 16.0*  --   HCT 46.6*  --   PLT 175  --   CREATININE 0.93  --   TROPONINIHS 27* 74*    Estimated Creatinine Clearance: 94 mL/min (by C-G formula based on SCr of 0.93 mg/dL).   Medical History: Past Medical History:  Diagnosis Date   Depression    Diabetes mellitus    Diabetes mellitus without complication (HCC)    Diverticula of intestine    High cholesterol    Hypertension    OSA on CPAP    CPAP pressure= 15   Polycystic ovarian syndrome    Sleep apnea    on CPAP with pressure setting= 15    Assessment: Renee Ho is a 47 y.o. year old female presented on 07/23/2023 with chest pain radiating to jaw and concern for ACS. No anticoagulation prior to admission. Pharmacy consulted to dose heparin.  Goal of Therapy:  Heparin level 0.3-0.7 units/ml Monitor platelets by anticoagulation protocol: Yes   Plan:  Heparin 4000 units x 1 as bolus followed by heparin infusion at 1100 units/hr 6 heparin level  Daily heparin level, CBC, and monitoring for bleeding F/u plans for anticoagulation and cath plans   Thank you for allowing pharmacy to participate in this patient's care.  Marja Kays, PharmD Emergency Medicine Clinical Pharmacist 07/23/2023,6:32  AM

## 2023-07-23 NOTE — Progress Notes (Signed)
   07/23/23 1600  Spiritual Encounters  Type of Visit Initial  Care provided to: Pt and family  Conversation partners present during encounter Nurse  Referral source Code page  Reason for visit Code  OnCall Visit Yes   Ch responded to Code STEMI. Pt's husband is present at bedside. Pt said she is scared but felt bless to have family support. Ch provided hospitality and compassionate presence. Ch remains available when needed.

## 2023-07-23 NOTE — Consult Note (Signed)
Cardiology Consultation:   Patient ID: Renee Ho MRN: [id number]; DOB: 07/23/1976  Admit date: 07/23/2023 Date of Consult: 07/23/2023  Primary Care Provider: Melida Quitter, PA Primary Cardiologist: None  Primary Electrophysiologist:  None    Patient Profile:   Renee Ho is a 47 y.o. female with a hx of type 2 diabetes, insulin-dependent, morbid obesity, severe dyslipidemia with hypertriglyceridemia and chronic pancreatitis who is being seen today for the evaluation of Chest pain and elevated troponins at the request of ED.  History of Present Illness:   [name] she reports she was in her normal state of health until around noon yesterday when she started noticing that she had nausea and belching and thought it was related to reflux.  It was associated with sharp pain and pressure.  This persisted and then developed into some chest pain which was sharp and then radiated into her jaw later in the day as the symptoms were still hurting so he presented to the ED around midnight.  In the ED was found to have ST changes made in the anterior lead.  Initial troponins were 27.   patient have CT of her chest and belly looking for a dissection that was unremarkable and thus cardiology was consulted.  Intervention was also called deferred to lab work initially and for further follow-up by general cardiology.  Past Medical History:  Diagnosis Date   Depression    Diabetes mellitus    Diabetes mellitus without complication (HCC)    Diverticula of intestine    High cholesterol    Hypertension    OSA on CPAP    CPAP pressure= 15   Polycystic ovarian syndrome    Sleep apnea    on CPAP with pressure setting= 15    Past Surgical History:  Procedure Laterality Date   ABDOMINAL HYSTERECTOMY     CYST REMOVAL NECK     KNEE ARTHROSCOPY  07/11/2012   Procedure: ARTHROSCOPY KNEE;  Surgeon: Cammy Copa, MD;  Location: [facility] OR;  Service: Orthopedics;   Laterality: Right;  Right knee arthroscopy with debridement   KNEE SURGERY     OVARIAN CYST REMOVAL     WISDOM TOOTH EXTRACTION  05/11/2017     Home Medications:  Prior to Admission medications   Medication Sig Start Date End Date Taking? Authorizing Provider  atorvastatin (LIPITOR) 10 MG tablet Take 1 tablet (10 mg total) by mouth daily. 03/22/23  Yes Carlean Jews, NP  dapagliflozin propanediol (FARXIGA) 10 MG TABS tablet Take 1 tablet (10 mg total) by mouth daily. 03/22/23  Yes Boscia, Kathlynn Grate, NP  DULoxetine (CYMBALTA) 30 MG capsule Take 3 capsules (90 mg total) by mouth daily. 03/22/23  Yes Boscia, Kathlynn Grate, NP  hydrochlorothiazide (HYDRODIURIL) 25 MG tablet Take 1 tablet (25 mg total) by mouth daily. 06/30/22  Yes Abonza, Maritza, PA-C  ibuprofen (ADVIL) 200 MG tablet Take 800 mg by mouth every 8 (eight) hours as needed for headache or moderate pain.   Yes [provider]  insulin glargine-yfgn (SEMGLEE, YFGN,) 100 UNIT/ML Pen Inject 100 Units into the skin at bedtime. 03/22/23  Yes Boscia, Heather E, NP  lisinopril (ZESTRIL) 5 MG tablet Take 1 tablet (5 mg total) by mouth daily. 03/22/23  Yes Carlean Jews, NP  metFORMIN (GLUCOPHAGE) 1000 MG tablet Take 1 tablet (1,000 mg total) by mouth 2 (two) times daily with a meal. 03/22/23  Yes Boscia, Kathlynn Grate, NP  PRESCRIPTION MEDICATION See admin instructions. CPAP- CONTINUOUS  Yes [provider]  tirzepatide Greggory Keen) 7.5 MG/0.5ML Pen Inject 7.5 mg into the skin once a week. 03/22/23  Yes Boscia, Kathlynn Grate, NP  clotrimazole-betamethasone (LOTRISONE) cream Apply 1 Application topically 2 (two) times daily. Patient not taking: Reported on 03/22/2023 10/28/22   Carlean Jews, NP  HYDROcodone-acetaminophen (NORCO) 5-325 MG tablet Take 1-2 tablets by mouth every 6 (six) hours as needed. Patient not taking: Reported on 07/23/2023 06/30/23   Geoffery Lyons, MD  Insulin Pen Needle 31G X 8 MM MISC Use as directed Patient not  taking: Reported on 06/30/2022 02/24/22   Mayer Masker, PA-C    Inpatient Medications: Scheduled Meds:   HYDROmorphone (DILAUDID) injection  1 mg Intravenous Once   Continuous Infusions:  heparin 1,100 Units/hr (07/23/23 0643)   PRN Meds: nitroGLYCERIN  Allergies:    Allergies  Allergen Reactions   Morphine And Codeine Other (See Comments)    Migraines   Reglan [Metoclopramide] Other (See Comments)    Psychotic episodes   Chantix [Varenicline] Other (See Comments)    Makes the patient angry feeling   Toradol [Ketorolac Tromethamine] Rash    Social History:   Social History   Socioeconomic History   Marital status: Married    Spouse name: Not on file   Number of children: 1   Years of education: College   Highest education level: Not on file  Occupational History   Not on file  Tobacco Use   Smoking status: Every Day    Current packs/day: 0.50    Average packs/day: 0.5 packs/day for 20.0 years (10.0 ttl pk-yrs)    Types: Cigarettes   Smokeless tobacco: Never   Tobacco comments:    stopped smoking on 02/17/17  Substance and Sexual Activity   Alcohol use: No    Alcohol/week: 0.0 standard drinks of alcohol    Comment: Rarely.   Drug use: No   Sexual activity: Yes    Birth control/protection: Surgical  Other Topics Concern   Not on file  Social History Narrative   ** Merged History Encounter **   Drinks diet Dr. Reino Kent about 1 a day.        Social Determinants of Health   Financial Resource Strain: Not on file  Food Insecurity: No Food Insecurity (06/19/2022)   Hunger Vital Sign    Worried About Running Out of Food in the Last Year: Never true    Ran Out of Food in the Last Year: Never true  Transportation Needs: No Transportation Needs (06/19/2022)   PRAPARE - Administrator, Civil Service (Medical): No    Lack of Transportation (Non-Medical): No  Physical Activity: Not on file  Stress: Not on file  Social Connections: Not on file  Intimate  Partner Violence: Not At Risk (06/19/2022)   Humiliation, Afraid, Rape, and Kick questionnaire    Fear of Current or Ex-Partner: No    Emotionally Abused: No    Physically Abused: No    Sexually Abused: No    Family History:    Family History  Problem Relation Age of Onset   Cancer Mother        breast   Mental illness Mother    Depression Mother    Hyperlipidemia Mother    Breast cancer Mother    Healthy Father    Hyperlipidemia Father    Diabetes Maternal Grandmother    Hyperlipidemia Maternal Grandmother    Cancer Maternal Grandfather        unknown type  Depression Maternal Grandfather    Hyperlipidemia Maternal Grandfather    Stroke Paternal Grandmother    Hyperlipidemia Paternal Grandmother    Hyperlipidemia Paternal Grandfather    Sleep apnea Neg Hx       Review of Systems: [y] = yes, [ ]  = no   General: Weight gain [ ] ; Weight loss [ ] ; Anorexia [ ] ; Fatigue [ ] ; Fever [ ] ; Chills [ ] ; Weakness [ ]   Cardiac: Chest pain/pressure [ ] ; Resting SOB [ ] ; Exertional SOB [ ] ; Orthopnea [ ] ; Pedal Edema [ ] ; Palpitations [ ] ; Syncope [ ] ; Presyncope [ ] ; Paroxysmal nocturnal dyspnea[ ]   Pulmonary: Cough [ ] ; Wheezing[ ] ; Hemoptysis[ ] ; Sputum [ ] ; Snoring [ ]   GI: Vomiting[ ] ; Dysphagia[ ] ; Melena[ ] ; Hematochezia [ ] ; Heartburn[ ] ; Abdominal pain [ ] ; Constipation [ ] ; Diarrhea [ ] ; BRBPR [ ]   GU: Hematuria[ ] ; Dysuria [ ] ; Nocturia[ ]   Vascular: Pain in legs with walking [ ] ; Pain in feet with lying flat [ ] ; Non-healing sores [ ] ; Stroke [ ] ; TIA [ ] ; Slurred speech [ ] ;  Neuro: Headaches[ ] ; Vertigo[ ] ; Seizures[ ] ; Paresthesias[ ] ;Blurred vision [ ] ; Diplopia [ ] ; Vision changes [ ]   Ortho/Skin: Arthritis [ ] ; Joint pain [ ] ; Muscle pain [ ] ; Joint swelling [ ] ; Back Pain [ ] ; Rash [ ]   Psych: Depression[ ] ; Anxiety[ ]   Heme: Bleeding problems [ ] ; Clotting disorders [ ] ; Anemia [ ]   Endocrine: Diabetes [ ] ; Thyroid dysfunction[ ]   Physical Exam/Data:   Vitals:    07/23/23 0345 07/23/23 0445 07/23/23 0545 07/23/23 0645  BP: (!) 150/72 (!) 147/82 (!) 149/70   Pulse: 71 70 75   Resp: 11 14 17    Temp:    98.5 F (36.9 C)  TempSrc:    Oral  SpO2: 93% 95% 96%   Weight:      Height:       No intake or output data in the 24 hours ending 07/23/23 0648 Filed Weights   07/23/23 0258  Weight: 117.9 kg   Body mass index is 43.27 kg/m.  General:  Well nourished, well developed, in moderate distress HEENT: normal Lymph: no adenopathy Neck: no JVD Endocrine:  No thryomegaly Vascular: No carotid bruits; FA pulses 2+ bilaterally without bruits  Cardiac:  normal S1, S2; RRR; no murmur  Lungs:  clear to auscultation bilaterally, no wheezing, rhonchi or rales  Abd: soft, nontender, no hepatomegaly  Ext: no edema Musculoskeletal:  No deformities, BUE and BLE strength normal and equal Skin: warm and dry  Neuro:  CNs 2-12 intact, no focal abnormalities noted Psych:  Normal affect   EKG:  The EKG was personally reviewed and demonstrates: Anterior ST depression in sinus rhythm Telemetry:  Telemetry was personally reviewed and demonstrates: Sinus  Relevant CV Studies: CT of chest demonstrates aortic atherosclerosis as well as great vessels and coronary arteries including calcified atherotic plaque in the LAD left circumflex and RCA  Laboratory Data:  Chemistry Recent Labs  Lab 07/23/23 0310  NA 137  K 3.7  CL 97*  CO2 24  GLUCOSE 254*  BUN 20  CREATININE 0.93  CALCIUM 9.5  GFRNONAA >60  ANIONGAP 16*    Recent Labs  Lab 07/23/23 0310  PROT 6.5  ALBUMIN 3.6  AST 21  ALT 12  ALKPHOS 50  BILITOT 1.1   Hematology Recent Labs  Lab 07/23/23 0310  WBC 10.7*  RBC 5.22*  HGB 16.0*  HCT  46.6*  MCV 89.3  MCH 30.7  MCHC 34.3  RDW 13.5  PLT 175   Cardiac EnzymesNo results for input(s): "TROPONINI" in the last 168 hours. No results for input(s): "TROPIPOC" in the last 168 hours.  BNPNo results for input(s): "BNP", "PROBNP" in the last  168 hours.  DDimer No results for input(s): "DDIMER" in the last 168 hours.  Radiology/Studies:  CT Angio Chest/Abd/Pel for Dissection W and/or W/WO  Result Date: 07/23/2023 CLINICAL DATA:  47 year old female with history of substernal chest pain for the past 2 hours radiating to the jaw. Indigestion. Acute aortic syndrome suspected. EXAM: CT ANGIOGRAPHY CHEST, ABDOMEN AND PELVIS TECHNIQUE: Non-contrast CT of the chest was initially obtained. Multidetector CT imaging through the chest, abdomen and pelvis was performed using the standard protocol during bolus administration of intravenous contrast. Multiplanar reconstructed images and MIPs were obtained and reviewed to evaluate the vascular anatomy. RADIATION DOSE REDUCTION: This exam was performed according to the departmental dose-optimization program which includes automated exposure control, adjustment of the mA and/or kV according to patient size and/or use of iterative reconstruction technique. CONTRAST:  75mL OMNIPAQUE IOHEXOL 350 MG/ML SOLN COMPARISON:  CT of the abdomen and pelvis 06/30/2023. Chest CT 04/05/2016. FINDINGS: CTA CHEST FINDINGS Cardiovascular: Precontrast images demonstrate no crescentic high attenuation associated with the wall of the thoracic aorta to suggest the presence of acute intramural hemorrhage. Postcontrast images demonstrate no evidence of thoracic aortic aneurysm or dissection. Thoracic aorta is normal in caliber measuring 3.1 cm, 2.5 cm and 2.3 cm in diameter in the ascending, mid aortic arch and descending thoracic aorta respectively. There is aortic atherosclerosis, as well as atherosclerosis of the great vessels of the mediastinum and the coronary arteries, including calcified atherosclerotic plaque in the left anterior descending, left circumflex and right coronary arteries. Heart size is normal. There is no significant pericardial fluid, thickening or pericardial calcification. Mediastinum/Nodes: No pathologically  enlarged mediastinal or hilar lymph nodes. Esophagus is unremarkable in appearance. No axillary lymphadenopathy. Lungs/Pleura: No acute consolidative airspace disease. No pleural effusions. No suspicious appearing pulmonary nodules or masses are noted. Musculoskeletal: There are no aggressive appearing lytic or blastic lesions noted in the visualized portions of the skeleton. Review of the MIP images confirms the above findings. CTA ABDOMEN AND PELVIS FINDINGS VASCULAR Aorta: Normal caliber aorta without aneurysm, dissection, vasculitis or significant stenosis. Celiac: Patent without evidence of aneurysm, dissection, vasculitis or significant stenosis. SMA: Patent without evidence of aneurysm, dissection, vasculitis or significant stenosis. Renals: Both renal arteries are patent without evidence of aneurysm, dissection, vasculitis, fibromuscular dysplasia or significant stenosis. IMA: Patent without evidence of aneurysm, dissection, vasculitis or significant stenosis. Inflow: Patent without evidence of aneurysm, dissection, vasculitis or significant stenosis. Veins: No obvious venous abnormality within the limitations of this arterial phase study. Review of the MIP images confirms the above findings. NON-VASCULAR Hepatobiliary: No suspicious cystic or solid hepatic lesions. No intra or extrahepatic biliary ductal dilatation. There is some amorphous intermediate attenuation material lying dependently in the gallbladder, likely biliary sludge. Gallbladder is moderately distended. No pericholecystic fluid or surrounding inflammatory changes. Pancreas: No pancreatic mass. No pancreatic ductal dilatation. No pancreatic or peripancreatic fluid collections or inflammatory changes. Spleen: Unremarkable. Adrenals/Urinary Tract: Bilateral kidneys and adrenal glands are normal in appearance. No hydroureteronephrosis urinary bladder is unremarkable in appearance. Stomach/Bowel: The appearance of the stomach is normal. No  pathologic dilatation of small bowel or colon. Numerous colonic diverticuli are noted, without surrounding inflammatory changes to clearly indicate an acute diverticulitis at this  time. Normal appendix. Lymphatic: No lymphadenopathy noted in the abdomen or pelvis. Reproductive: Status post hysterectomy. Right ovary is not confidently identified may be surgically absent or atrophic. Left ovary appears enlarged and is complex in appearance estimated to measure approximately 6.9 x 3.4 x 4.5 cm. Other: No significant volume of ascites.  No pneumoperitoneum. Musculoskeletal: There are no aggressive appearing lytic or blastic lesions noted in the visualized portions of the skeleton. Review of the MIP images confirms the above findings. IMPRESSION: 1. No acute findings noted in the chest, abdomen or pelvis to account for the patient's symptoms. Specifically, no evidence of acute aortic syndrome. 2. Small amount of biliary sludge lying dependently in the gallbladder. No findings to suggest an acute cholecystitis are noted at this time. 3. Enlarged complex appearing left ovary measuring 6.9 x 3.4 x 4.5 cm. Outpatient referral for nonemergent pelvic ultrasound is recommended in the near future to better evaluate this finding and exclude underlying ovarian neoplasm. 4. Colonic diverticulosis without evidence of acute diverticulitis at this time. 5. Aortic atherosclerosis, in addition to three-vessel coronary artery disease. Please note that although the presence of coronary artery calcium documents the presence of coronary artery disease, the severity of this disease and any potential stenosis cannot be assessed on this non-gated CT examination. Assessment for potential risk factor modification, dietary therapy or pharmacologic therapy may be warranted, if clinically indicated. Electronically Signed   By: Trudie Reed M.D.   On: 07/23/2023 06:21   DG Chest Port 1 View  Result Date: 07/23/2023 CLINICAL DATA:   47 year old female with history of chest pain. EXAM: PORTABLE CHEST 1 VIEW COMPARISON:  Chest x-ray 10/18/2016. FINDINGS: Lung volumes are low. No consolidative airspace disease. No pleural effusions. No pneumothorax. No pulmonary nodule or mass noted. Pulmonary vasculature is normal. Heart size appears borderline enlarged, likely accentuated by low lung volumes and portable AP technique. IMPRESSION: 1. Low lung volumes without radiographic evidence of acute cardiopulmonary disease. Electronically Signed   By: Trudie Reed M.D.   On: 07/23/2023 05:04    Assessment and Plan:   Acute coronary syndrome with non-STEMI, high risk Premature subclinical coronary disease CT chest abdomen 07/23/2023 with diffuse atherosclerosis in the coronary arteries and all 3 vessels as well as great vessels Type 2 diabetes insulin-dependent Class III obesity  Plan: -Start heparin drip in addition to the aspirin given earlier.  Will add Nitropaste and additional sublingual nitroglycerin. -Low threshold to take to the Cath Lab this morning -Echocardiogram -High intensity statin -Admit to medicine.  45 minutes of critical care including discussion with ED physician patient reviewing chart.  For questions or updates, please contact [facility] Please consult [email or web address] for contact info under     Signed, Macie Burows, MD  07/23/2023 6:48 AM

## 2023-07-23 NOTE — Plan of Care (Signed)
  Problem: Education: Goal: Understanding of CV disease, CV risk reduction, and recovery process will improve Outcome: Progressing Goal: Individualized Educational Video(s) Outcome: Progressing   Problem: Activity: Goal: Ability to return to baseline activity level will improve Outcome: Progressing   Problem: Cardiovascular: Goal: Ability to achieve and maintain adequate cardiovascular perfusion will improve Outcome: Progressing Goal: Vascular access site(s) Level 0-1 will be maintained Outcome: Progressing   Problem: Health Behavior/Discharge Planning: Goal: Ability to safely manage health-related needs after discharge will improve Outcome: Progressing   Problem: Education: Goal: Knowledge of General Education information will improve Description: Including pain rating scale, medication(s)/side effects and non-pharmacologic comfort measures Outcome: Progressing   Problem: Clinical Measurements: Goal: Will remain free from infection Outcome: Progressing Goal: Respiratory complications will improve Outcome: Progressing Goal: Cardiovascular complication will be avoided Outcome: Progressing

## 2023-07-23 NOTE — ED Triage Notes (Addendum)
Pt arrives to ED c/o substernal CP x 2 hours that radiates to jaw. Pt with indigestion since noon yesterday and it has gotten progressively worse. Pt denies N/V but endorses SOB. Positive EKG changes noted by EMS. Pt gieven 0.4 of sublingual nitroglycerin and 324mg  of ASA per EMS

## 2023-07-23 NOTE — Progress Notes (Addendum)
Interval Coverage Note: (see overnight fellow's note for more detail)  47 yo female with PMH of IDDM, morbid obesity, HLD and chronic pancreatitis who presented with chest pain started yesterday. Serial hs trop 27 --> 74. EKG showed sinus rhythm with significant ST depression in lead V2, but not in other adjacent leads. CT of abdomen showed diffuse atherosclerosis in coronary arteries.   Subjective: Had typical acid reflux on Monday associated with burping. Started having sharp substernal chest pain around noon yesterday radiating to the right chest. Also had right jaw pain and neck pain radiating down the right arm. Symptom went away for 3 min with Dilaudid but quickly came back. Essentially had hours of chest pain. Also has nausea despite multiple Zofran. Chest pain is worse with laying back, deep inspiration and palpation. Had some dyspnea with ambulation at daughter's home yesterday.  Objective: Physical exam:  General: alert and oriented x3  Cardiac: RRR, no significant heart murmur  Pulm: clear to auscultation, no wheezing, rhonchi or crackles  Assessment and plan:  NSTEMI  - serial trop 27 --> 74. Pending repeat. EKG showed ST depression in lead V2, not in adjacent leads  - significant coronary calcification seen on abdominal CT  - some atypical features such as chest pain worse with laying back, worse with deep inspiration and palpation. No EKG change to suggest pericarditis. Obtain Sed rate and CRP  - unable to explain the EKG change. Will discuss with MD. Pending echocardiogram.  2. IDDM: SSI  3. Hyperlipidemia  4. Morbid obesity   5. Abnormal abdominal US: 6.9 x 3.4 x 4.5 complex appearing L ovary, recommended outpatient pelvic US to rule out overian neoplasm  6. Chronic pancreatitis: no acute finding on CT of abdomen, chronic abdominal soreness, different from the current chest pain  Renee Course PA-C  ADDENDUM:   Patient seen and examined with Renee Ho PAC.  I personally  taken a history, examined the patient, reviewed relevant notes,  laboratory data / imaging studies.  I performed a substantive portion of this encounter and formulated the important aspects of the plan.  I agree with the APP's note, impression, and recommendations.  I have personally discussed the plan with patient.  Patient states that on Monday 07/18/2023 she started having symptoms consistent with heartburn/burping.  However yesterday is very having substernal chest pain 10 out of 10, radiating to the right side of the chest as well as the right arm and jaw.  At times brought on by effort related activities and usually self limited.  Patient was placed on Dilaudid overnight which did help her symptoms and now she is currently on sublingual nitroglycerin drip and heparin.  Her pain is now 2 out of 10.  Review of systems: Positive for chest pain (improved), nausea (now resolved), no near-syncope or syncopal events.  No orthopnea or PND or lower extremity swelling.  PHYSICAL EXAM:    07/23/2023   10:20 AM 07/23/2023   10:15 AM 07/23/2023   10:00 AM  Vitals with BMI  Systolic 114 116 161  Diastolic 76 75 72  Pulse 56 58 59    Net IO Since Admission: 14.31 mL [07/23/23 1112]  Filed Weights   07/23/23 0258  Weight: 117.9 kg   Lab Results  Component Value Date   WBC 10.7 (H) 07/23/2023   HGB 16.0 (H) 07/23/2023   HCT 46.6 (H) 07/23/2023   MCV 89.3 07/23/2023   PLT 175 07/23/2023       Latest Ref Rng &  Units 07/23/2023    3:10 AM 06/30/2023    1:10 AM 06/27/2022    3:00 AM  BMP  Glucose 70 - 99 mg/dL 528  413  244   BUN 6 - 20 mg/dL 20  19  16    Creatinine 0.44 - 1.00 mg/dL 0.10  2.72  5.36   Sodium 135 - 145 mmol/L 137  137  138   Potassium 3.5 - 5.1 mmol/L 3.7  4.1  4.0   Chloride 98 - 111 mmol/L 97  106  104   CO2 22 - 32 mmol/L 24  23  25    Calcium 8.9 - 10.3 mg/dL 9.5  8.9  9.3    Cardiac Panel (last 3 results) Recent Labs    07/23/23 0502 07/23/23 0752  07/23/23 1000  TROPONINIHS 74* 296* 900*   Lab Results  Component Value Date   CHOL 180 07/23/2023   HDL 28 (L) 07/23/2023   LDLCALC 101 (H) 07/23/2023   LDLDIRECT 129 (H) 05/22/2013   TRIG 253 (H) 07/23/2023   CHOLHDL 6.4 07/23/2023    Physical Exam  Constitutional: No distress.  hemodynamically stable  Neck: No JVD present.  Cardiovascular: Normal rate, regular rhythm, S1 normal, S2 normal, intact distal pulses and normal pulses. Exam reveals no gallop, no S3 and no S4.  No murmur heard. Pulmonary/Chest: Effort normal and breath sounds normal. No stridor. She has no wheezes. She has no rales.  Abdominal: Soft. Bowel sounds are normal. She exhibits no distension. There is no abdominal tenderness.  Musculoskeletal:        General: No edema.     Cervical back: Neck supple.  Neurological: She is alert and oriented to person, place, and time. She has intact cranial nerves (2-12).  Skin: Skin is warm and moist.   12 lead EKG was personally reviewed by me: 07/23/2023 Sinus rhythm, 80 bpm, ST-T changes in leads V2, without myocardial injury.   Telemetry: Predominantly sinus with rare ectopy   Assessment/Plan: NSTEMI Coronary calcification. Aortic atherosclerosis. Chest pain concerning for cardiac etiology. High sensitive troponins trending up. EKG does not illustrate STEMI Chest pain has improved with Dilaudid, IV nitro, and heparin drip Multiple cardiovascular risk factors which include but not limited to: Diabetes, 30 years after active smoking, obesity, coronary calcification on nongated CT study, aortic atherosclerosis. Echocardiogram-preliminary reviewed no obvious regional wall motion abnormalities and LVEF is preserved. Will continue IV and nitro drips for now. Admit to telemetry. For now we will tentatively plan for left heart catheterization on 07/25/2023 to evaluate for obstructive disease.  However, if she has chest pain despite medical therapy or EKG showed STEMI  she should be taken to the heart catheterization lab sooner than Monday.  Patient is agreeable with plan of care. Repeat EKG. Will trend troponins until they peak. Differential diagnosis also includes pericarditis-ESR CRP not significantly elevated.  Very minimal PR elevation in aVR.  She is afebrile and no pericardial effusion on echo.  If she has no obstructive disease may consider short Ho of NSAIDs/colchicine.  Informed Consent   Shared Decision Making/Informed Consent The risks [stroke (1 in 1000), death (1 in 1000), kidney failure [usually temporary] (1 in 500), bleeding (1 in 200), allergic reaction [possibly serious] (1 in 200)], benefits (diagnostic support and management of coronary artery disease) and alternatives of a cardiac catheterization were discussed in detail with Renee Ho and she is willing to proceed.    Hyperlipidemia: Hypertriglyceridemia. Continue Lipitor 80 mg p.o. nightly Setia 10 mg p.o.  daily. Will start fenofibrate. Will check direct LDL in the morning  Cigarette smoking: Educated on the importance of complete smoking cessation.  She is allergic to Chantix.  Tobacco cessation counseling: Currently smoking 1 packs/day   She is informed of the dangers of tobacco abuse including stroke, cancer, and MI, as well as benefits of tobacco cessation. She is willing to quit at this time. 5 mins were spent counseling patient cessation techniques. We discussed various methods to help quit smoking, including deciding on a date to quit, joining a support group, pharmacological agents- nicotine gum/patch/lozenges.   Remainder of management per primary team.  Further recommendations to follow as the case evolves.   This note was created using a voice recognition software as a result there may be grammatical errors inadvertently enclosed that do not reflect the nature of this encounter. Every attempt is made to correct such errors.   Tessa Lerner, DO, [facility]  [facility]  [address] [place], [place] [address] 07/23/2023 11:12 AM

## 2023-07-23 NOTE — ED Provider Notes (Signed)
[place] EMERGENCY DEPARTMENT AT [facility] Provider Note   CSN: [id number] Arrival date & time: 07/23/23  4098     History  Chief Complaint  Patient presents with   Chest Pain    Renee Ho is a 47 y.o. female.   Chest Pain Renee Ho is a 47 y.o. female who presents to the Emergency Department complaining of.  She presents the emergency department by EMS for evaluation of chest pain that started at noon.  She describes it as an indigestion type pain that started initially but now it is both sharp and pressure and radiates to her jaw.  She has associated nausea.  She has never had pain like this before.  No associated shortness of breath but she feels like she is breathing differently.  No abdominal pain, vomiting, leg swelling or pain.  She has a history of hypertension, hyperlipidemia, diabetes, PCOS.  She is a smoker.  No history of cardiac disease.      Home Medications Prior to Admission medications   Medication Sig Start Date End Date Taking? Authorizing Provider  atorvastatin (LIPITOR) 10 MG tablet Take 1 tablet (10 mg total) by mouth daily. 03/22/23  Yes Carlean Jews, NP  dapagliflozin propanediol (FARXIGA) 10 MG TABS tablet Take 1 tablet (10 mg total) by mouth daily. 03/22/23  Yes Boscia, Kathlynn Grate, NP  DULoxetine (CYMBALTA) 30 MG capsule Take 3 capsules (90 mg total) by mouth daily. 03/22/23  Yes Boscia, Kathlynn Grate, NP  hydrochlorothiazide (HYDRODIURIL) 25 MG tablet Take 1 tablet (25 mg total) by mouth daily. 06/30/22  Yes Abonza, Maritza, PA-C  ibuprofen (ADVIL) 200 MG tablet Take 800 mg by mouth every 8 (eight) hours as needed for headache or moderate pain.   Yes [provider]  insulin glargine-yfgn (SEMGLEE, YFGN,) 100 UNIT/ML Pen Inject 100 Units into the skin at bedtime. 03/22/23  Yes Boscia, Heather E, NP  lisinopril (ZESTRIL) 5 MG tablet Take 1 tablet (5 mg total) by mouth daily. 03/22/23  Yes Carlean Jews,  NP  metFORMIN (GLUCOPHAGE) 1000 MG tablet Take 1 tablet (1,000 mg total) by mouth 2 (two) times daily with a meal. 03/22/23  Yes Boscia, Kathlynn Grate, NP  PRESCRIPTION MEDICATION See admin instructions. CPAP- CONTINUOUS   Yes [provider]  tirzepatide (MOUNJARO) 7.5 MG/0.5ML Pen Inject 7.5 mg into the skin once a week. 03/22/23  Yes Boscia, Kathlynn Grate, NP  clotrimazole-betamethasone (LOTRISONE) cream Apply 1 Application topically 2 (two) times daily. Patient not taking: Reported on 03/22/2023 10/28/22   Carlean Jews, NP  HYDROcodone-acetaminophen (NORCO) 5-325 MG tablet Take 1-2 tablets by mouth every 6 (six) hours as needed. Patient not taking: Reported on 07/23/2023 06/30/23   Geoffery Lyons, MD  Insulin Pen Needle 31G X 8 MM MISC Use as directed Patient not taking: Reported on 06/30/2022 02/24/22   Mayer Masker, PA-C      Allergies    Morphine and codeine, Reglan [metoclopramide], Chantix [varenicline], and Toradol [ketorolac tromethamine]    Review of Systems   Review of Systems  Cardiovascular:  Positive for chest pain.  All other systems reviewed and are negative.   Physical Exam Updated Vital Signs BP (!) 149/70   Pulse 75   Temp 98.5 F (36.9 C) (Oral)   Resp 17   Ht 5\' 5"  (1.651 m)   Wt 117.9 kg   SpO2 96%   BMI 43.27 kg/m  Physical Exam Vitals and nursing note reviewed.  Constitutional:  Appearance: She is well-developed.  HENT:     Head: Normocephalic and atraumatic.  Cardiovascular:     Rate and Rhythm: Normal rate and regular rhythm.     Heart sounds: No murmur heard. Pulmonary:     Effort: Pulmonary effort is normal. No respiratory distress.     Breath sounds: Normal breath sounds.  Abdominal:     Palpations: Abdomen is soft.     Tenderness: There is no abdominal tenderness. There is no guarding or rebound.  Musculoskeletal:        General: No swelling or tenderness.     Comments: 2+ radial and DP pulses bilaterally  Skin:    General: Skin  is warm and dry.  Neurological:     Mental Status: She is alert and oriented to person, place, and time.  Psychiatric:        Behavior: Behavior normal.     ED Results / Procedures / Treatments   Labs (all labs ordered are listed, but only abnormal results are displayed) Labs Reviewed  COMPREHENSIVE METABOLIC PANEL - Abnormal; Notable for the following components:      Result Value   Chloride 97 (*)    Glucose, Bld 254 (*)    Anion gap 16 (*)    All other components within normal limits  CBC WITH DIFFERENTIAL/PLATELET - Abnormal; Notable for the following components:   WBC 10.7 (*)    RBC 5.22 (*)    Hemoglobin 16.0 (*)    HCT 46.6 (*)    Eosinophils Absolute 0.6 (*)    Abs Immature Granulocytes 0.11 (*)    All other components within normal limits  TROPONIN I (HIGH SENSITIVITY) - Abnormal; Notable for the following components:   Troponin I (High Sensitivity) 27 (*)    All other components within normal limits  TROPONIN I (HIGH SENSITIVITY) - Abnormal; Notable for the following components:   Troponin I (High Sensitivity) 74 (*)    All other components within normal limits  LIPASE, BLOOD  HEPARIN LEVEL (UNFRACTIONATED)  TROPONIN I (HIGH SENSITIVITY)    EKG EKG Interpretation Date/Time:  Saturday July 23 2023 04:46:17 EDT Ventricular Rate:  80 PR Interval:  188 QRS Duration:  87 QT Interval:  382 QTC Calculation: 441 R Axis:   19  Text Interpretation: Sinus rhythm Borderline repolarization abnormality Confirmed by Tilden Fossa [phone]) on 07/23/2023 5:41:20 AM  Radiology CT Angio Chest/Abd/Pel for Dissection W and/or W/WO  Result Date: 07/23/2023 CLINICAL DATA:  47 year old female with history of substernal chest pain for the past 2 hours radiating to the jaw. Indigestion. Acute aortic syndrome suspected. EXAM: CT ANGIOGRAPHY CHEST, ABDOMEN AND PELVIS TECHNIQUE: Non-contrast CT of the chest was initially obtained. Multidetector CT imaging through the chest,  abdomen and pelvis was performed using the standard protocol during bolus administration of intravenous contrast. Multiplanar reconstructed images and MIPs were obtained and reviewed to evaluate the vascular anatomy. RADIATION DOSE REDUCTION: This exam was performed according to the departmental dose-optimization program which includes automated exposure control, adjustment of the mA and/or kV according to patient size and/or use of iterative reconstruction technique. CONTRAST:  75mL OMNIPAQUE IOHEXOL 350 MG/ML SOLN COMPARISON:  CT of the abdomen and pelvis 06/30/2023. Chest CT 04/05/2016. FINDINGS: CTA CHEST FINDINGS Cardiovascular: Precontrast images demonstrate no crescentic high attenuation associated with the wall of the thoracic aorta to suggest the presence of acute intramural hemorrhage. Postcontrast images demonstrate no evidence of thoracic aortic aneurysm or dissection. Thoracic aorta is normal in caliber measuring 3.1 cm,  2.5 cm and 2.3 cm in diameter in the ascending, mid aortic arch and descending thoracic aorta respectively. There is aortic atherosclerosis, as well as atherosclerosis of the great vessels of the mediastinum and the coronary arteries, including calcified atherosclerotic plaque in the left anterior descending, left circumflex and right coronary arteries. Heart size is normal. There is no significant pericardial fluid, thickening or pericardial calcification. Mediastinum/Nodes: No pathologically enlarged mediastinal or hilar lymph nodes. Esophagus is unremarkable in appearance. No axillary lymphadenopathy. Lungs/Pleura: No acute consolidative airspace disease. No pleural effusions. No suspicious appearing pulmonary nodules or masses are noted. Musculoskeletal: There are no aggressive appearing lytic or blastic lesions noted in the visualized portions of the skeleton. Review of the MIP images confirms the above findings. CTA ABDOMEN AND PELVIS FINDINGS VASCULAR Aorta: Normal caliber aorta  without aneurysm, dissection, vasculitis or significant stenosis. Celiac: Patent without evidence of aneurysm, dissection, vasculitis or significant stenosis. SMA: Patent without evidence of aneurysm, dissection, vasculitis or significant stenosis. Renals: Both renal arteries are patent without evidence of aneurysm, dissection, vasculitis, fibromuscular dysplasia or significant stenosis. IMA: Patent without evidence of aneurysm, dissection, vasculitis or significant stenosis. Inflow: Patent without evidence of aneurysm, dissection, vasculitis or significant stenosis. Veins: No obvious venous abnormality within the limitations of this arterial phase study. Review of the MIP images confirms the above findings. NON-VASCULAR Hepatobiliary: No suspicious cystic or solid hepatic lesions. No intra or extrahepatic biliary ductal dilatation. There is some amorphous intermediate attenuation material lying dependently in the gallbladder, likely biliary sludge. Gallbladder is moderately distended. No pericholecystic fluid or surrounding inflammatory changes. Pancreas: No pancreatic mass. No pancreatic ductal dilatation. No pancreatic or peripancreatic fluid collections or inflammatory changes. Spleen: Unremarkable. Adrenals/Urinary Tract: Bilateral kidneys and adrenal glands are normal in appearance. No hydroureteronephrosis urinary bladder is unremarkable in appearance. Stomach/Bowel: The appearance of the stomach is normal. No pathologic dilatation of small bowel or colon. Numerous colonic diverticuli are noted, without surrounding inflammatory changes to clearly indicate an acute diverticulitis at this time. Normal appendix. Lymphatic: No lymphadenopathy noted in the abdomen or pelvis. Reproductive: Status post hysterectomy. Right ovary is not confidently identified may be surgically absent or atrophic. Left ovary appears enlarged and is complex in appearance estimated to measure approximately 6.9 x 3.4 x 4.5 cm. Other: No  significant volume of ascites.  No pneumoperitoneum. Musculoskeletal: There are no aggressive appearing lytic or blastic lesions noted in the visualized portions of the skeleton. Review of the MIP images confirms the above findings. IMPRESSION: 1. No acute findings noted in the chest, abdomen or pelvis to account for the patient's symptoms. Specifically, no evidence of acute aortic syndrome. 2. Small amount of biliary sludge lying dependently in the gallbladder. No findings to suggest an acute cholecystitis are noted at this time. 3. Enlarged complex appearing left ovary measuring 6.9 x 3.4 x 4.5 cm. Outpatient referral for nonemergent pelvic ultrasound is recommended in the near future to better evaluate this finding and exclude underlying ovarian neoplasm. 4. Colonic diverticulosis without evidence of acute diverticulitis at this time. 5. Aortic atherosclerosis, in addition to three-vessel coronary artery disease. Please note that although the presence of coronary artery calcium documents the presence of coronary artery disease, the severity of this disease and any potential stenosis cannot be assessed on this non-gated CT examination. Assessment for potential risk factor modification, dietary therapy or pharmacologic therapy may be warranted, if clinically indicated. Electronically Signed   By: Trudie Reed M.D.   On: 07/23/2023 06:21   DG Chest [facility]  1 View  Result Date: 07/23/2023 CLINICAL DATA:  47 year old female with history of chest pain. EXAM: PORTABLE CHEST 1 VIEW COMPARISON:  Chest x-ray 10/18/2016. FINDINGS: Lung volumes are low. No consolidative airspace disease. No pleural effusions. No pneumothorax. No pulmonary nodule or mass noted. Pulmonary vasculature is normal. Heart size appears borderline enlarged, likely accentuated by low lung volumes and portable AP technique. IMPRESSION: 1. Low lung volumes without radiographic evidence of acute cardiopulmonary disease. Electronically Signed   By:  Trudie Reed M.D.   On: 07/23/2023 05:04    Procedures Procedures   CRITICAL CARE Performed by: Tilden Fossa   Total critical care time: 60 minutes  Critical care time was exclusive of separately billable procedures and treating other patients.  Critical care was necessary to treat or prevent imminent or life-threatening deterioration.  Critical care was time spent personally by me on the following activities: development of treatment plan with patient and/or surrogate as well as nursing, discussions with consultants, evaluation of patient's response to treatment, examination of patient, obtaining history from patient or surrogate, ordering and performing treatments and interventions, ordering and review of laboratory studies, ordering and review of radiographic studies, pulse oximetry and re-evaluation of patient's condition.  Medications Ordered in ED Medications  HYDROmorphone (DILAUDID) injection 1 mg (1 mg Intravenous Not Given 07/23/23 0635)  heparin ADULT infusion 100 units/mL (25000 units/231mL) (1,100 Units/hr Intravenous New Bag/Given 07/23/23 0643)  atorvastatin (LIPITOR) tablet 80 mg (has no administration in time range)  metoprolol tartrate (LOPRESSOR) tablet 25 mg (has no administration in time range)  ezetimibe (ZETIA) tablet 10 mg (has no administration in time range)  nitroGLYCERIN 50 mg in dextrose 5 % 250 mL (0.2 mg/mL) infusion (has no administration in time range)  ondansetron (ZOFRAN) injection 4 mg (has no administration in time range)  fentaNYL (SUBLIMAZE) injection 50 mcg (50 mcg Intravenous Given 07/23/23 0307)  ondansetron (ZOFRAN) injection 4 mg (4 mg Intravenous Given 07/23/23 0306)  HYDROmorphone (DILAUDID) injection 1 mg (1 mg Intravenous Given 07/23/23 0328)  iohexol (OMNIPAQUE) 350 MG/ML injection 75 mL (75 mLs Intravenous Contrast Given 07/23/23 0410)  pantoprazole (PROTONIX) injection 40 mg (40 mg Intravenous Given 07/23/23 0500)  HYDROmorphone  (DILAUDID) injection 1 mg (1 mg Intravenous Given 07/23/23 0503)  nitroGLYCERIN (NITROGLYN) 2 % ointment 1 inch (1 inch Topical Given 07/23/23 0634)  heparin bolus via infusion 4,000 Units (4,000 Units Intravenous Bolus from Bag 07/23/23 1610)    ED Course/ Medical Decision Making/ A&P                                 Medical Decision Making Amount and/or Complexity of Data Reviewed Labs: ordered. Radiology: ordered.  Risk Prescription drug management. Decision regarding hospitalization.   Patient with history of hypertension, hyperlipidemia, diabetes and tobacco use here for evaluation of chest pain since noon.  Prehospital EKG does have some borderline ST elevation in the inferior leads.  EKGs on ED arrival are improved compared to prehospital EKGs.  Discussed with cardiology regarding these EKG changes, will not activate a STEMI at this time as they do not quite meet the criteria for activation.  Initial troponin is minimally elevated.  She did receive aspirin prior to ED arrival.  She also received nitroglycerin with no improvement in her pain.  She was treated with fentanyl, again no improvement in pain.  She received hydromorphone, which did provide very brief relief of pain but it quickly returned.  Given severity of pain a CTA dissection was obtained, which is negative for acute dissection or PE.  Discussed again with Cardiology, Dr. Aron Baba -will see the patient in consult.  Repeat troponin is uptrending, patient with ongoing pain.  Patient has been started on heparin by cardiology.  Given no significant change in pain with nitroglycerin, will provide additional hydromorphone.  Medicine consulted for admission.        Final Clinical Impression(s) / ED Diagnoses Final diagnoses:  NSTEMI (non-ST elevated myocardial infarction) [facility])    Rx / DC Orders ED Discharge Orders     None         Tilden Fossa, MD 07/23/23 [phone]

## 2023-07-23 NOTE — ED Notes (Signed)
ED TO INPATIENT HANDOFF REPORT  ED Nurse Name and Phone #: [phone]  S Name/Age/Gender Renee Ho 47 y.o. female Room/Bed: TRACC/TRACC  Code Status   Code Status: Full Code  Home/SNF/Other Home Patient oriented to: self, place, time, and situation Is this baseline? Yes   Triage Complete: Triage complete  Chief Complaint NSTEMI (non-ST elevated myocardial infarction) [facility]) [I21.4]  Triage Note Pt arrives to ED c/o substernal CP x 2 hours that radiates to jaw. Pt with indigestion since noon yesterday and it has gotten progressively worse. Pt denies N/V but endorses SOB. Positive EKG changes noted by EMS. Pt gieven 0.4 of sublingual nitroglycerin and 324mg  of ASA per EMS    Allergies Allergies  Allergen Reactions   Morphine And Codeine Other (See Comments)    Migraines   Reglan [Metoclopramide] Other (See Comments)    Psychotic episodes   Chantix [Varenicline] Other (See Comments)    Makes the patient angry feeling   Toradol [Ketorolac Tromethamine] Rash    Level of Care/Admitting Diagnosis ED Disposition     ED Disposition  Admit   Condition  --   Comment  Hospital Area: [facility] [[id number]]  Level of Care: Progressive [102]  Admit to Progressive based on following criteria: CARDIOVASCULAR & THORACIC of moderate stability with acute coronary syndrome symptoms/low risk myocardial infarction/hypertensive urgency/arrhythmias/heart failure potentially compromising stability and stable post cardiovascular intervention patients.  May place patient in observation at [facility] or [facility] if equivalent level of care is available:: No  Covid Evaluation: Asymptomatic - no recent exposure (last 10 days) testing not required  Diagnosis: NSTEMI (non-ST elevated myocardial infarction) [facility]) [[id number]]  Admitting Physician: Clydie Braun [[id number]]  Attending Physician: Clydie Braun [[id number]]          B Medical/Surgery History Past  Medical History:  Diagnosis Date   Depression    Diabetes mellitus    Diabetes mellitus without complication (HCC)    Diverticula of intestine    High cholesterol    Hypertension    OSA on CPAP    CPAP pressure= 15   Polycystic ovarian syndrome    Sleep apnea    on CPAP with pressure setting= 15   Past Surgical History:  Procedure Laterality Date   ABDOMINAL HYSTERECTOMY     CYST REMOVAL NECK     KNEE ARTHROSCOPY  07/11/2012   Procedure: ARTHROSCOPY KNEE;  Surgeon: Cammy Copa, MD;  Location: [facility] OR;  Service: Orthopedics;  Laterality: Right;  Right knee arthroscopy with debridement   KNEE SURGERY     OVARIAN CYST REMOVAL     WISDOM TOOTH EXTRACTION  05/11/2017     A IV Location/Drains/Wounds Patient Lines/Drains/Airways Status     Active Line/Drains/Airways     Name Placement date Placement time Site Days   Peripheral IV 07/23/23 18 G Anterior;Left;Proximal Forearm 07/23/23  0253  Forearm  less than 1   Peripheral IV 07/23/23 20 G 1" Anterior;Proximal;Right Forearm 07/23/23  0308  Forearm  less than 1            Intake/Output Last 24 hours  Intake/Output Summary (Last 24 hours) at 07/23/2023 1528 Last data filed at 07/23/2023 1457 Gross per 24 hour  Intake 178.56 ml  Output --  Net 178.56 ml    Labs/Imaging Results for orders placed or performed during the hospital encounter of 07/23/23 (from the past 48 hour(s))  Comprehensive metabolic panel     Status: Abnormal   Collection Time: 07/23/23  3:10 AM  Result Value Ref Range   Sodium 137 135 - 145 mmol/L   Potassium 3.7 3.5 - 5.1 mmol/L   Chloride 97 (L) 98 - 111 mmol/L   CO2 24 22 - 32 mmol/L   Glucose, Bld 254 (H) 70 - 99 mg/dL    Comment: Glucose reference range applies only to samples taken after fasting for at least 8 hours.   BUN 20 6 - 20 mg/dL   Creatinine, Ser 4.13 0.44 - 1.00 mg/dL   Calcium 9.5 8.9 - 24.4 mg/dL   Total Protein 6.5 6.5 - 8.1 g/dL   Albumin 3.6 3.5 - 5.0 g/dL   AST 21  15 - 41 U/L   ALT 12 0 - 44 U/L   Alkaline Phosphatase 50 38 - 126 U/L   Total Bilirubin 1.1 0.3 - 1.2 mg/dL   GFR, Estimated >01 >02 mL/min    Comment: (NOTE) Calculated using the CKD-EPI Creatinine Equation (2021)    Anion gap 16 (H) 5 - 15    Comment: Performed at [facility], [address], [place], [place] [address]  Troponin I (High Sensitivity)     Status: Abnormal   Collection Time: 07/23/23  3:10 AM  Result Value Ref Range   Troponin I (High Sensitivity) 27 (H) <18 ng/L    Comment: (NOTE) Elevated high sensitivity troponin I (hsTnI) values and significant  changes across serial measurements may suggest ACS but many other  chronic and acute conditions are known to elevate hsTnI results.  Refer to the "Links" section for chest pain algorithms and additional  guidance. Performed at [facility], [address] [place], [place] [address]   Lipase, blood     Status: None   Collection Time: 07/23/23  3:10 AM  Result Value Ref Range   Lipase 40 11 - 51 U/L    Comment: Performed at [facility], [address] [place], [place] [address]  CBC with Differential     Status: Abnormal   Collection Time: 07/23/23  3:10 AM  Result Value Ref Range   WBC 10.7 (H) 4.0 - 10.5 K/uL   RBC 5.22 (H) 3.87 - 5.11 MIL/uL   Hemoglobin 16.0 (H) 12.0 - 15.0 g/dL   HCT 95.6 (H) 38.7 - 56.4 %   MCV 89.3 80.0 - 100.0 fL   MCH 30.7 26.0 - 34.0 pg   MCHC 34.3 30.0 - 36.0 g/dL   RDW 33.2 95.1 - 88.4 %   Platelets 175 150 - 400 K/uL   nRBC 0.0 0.0 - 0.2 %   Neutrophils Relative % 62 %   Neutro Abs 6.6 1.7 - 7.7 K/uL   Lymphocytes Relative 24 %   Lymphs Abs 2.5 0.7 - 4.0 K/uL   Monocytes Relative 7 %   Monocytes Absolute 0.8 0.1 - 1.0 K/uL   Eosinophils Relative 5 %   Eosinophils Absolute 0.6 (H) 0.0 - 0.5 K/uL   Basophils Relative 1 %   Basophils Absolute 0.1 0.0 - 0.1 K/uL   Immature Granulocytes 1 %   Abs Immature Granulocytes 0.11 (H) 0.00 - 0.07 K/uL    Comment:  Performed at [facility], [address] [place], [place] [address]  Troponin I (High Sensitivity)     Status: Abnormal   Collection Time: 07/23/23  5:02 AM  Result Value Ref Range   Troponin I (High Sensitivity) 74 (H) <18 ng/L    Comment: RESULT CALLED TO, READ BACK BY  AND VERIFIED WITH A. COBB RN 07/23/23 @0606  BY J. WHITE (NOTE) Elevated high sensitivity troponin I (hsTnI) values and significant  changes across serial measurements may suggest ACS but many other  chronic and acute conditions are known to elevate hsTnI results.  Refer to the "Links" section for chest pain algorithms and additional  guidance. Performed at [facility], [address], [place], [place] [address]   Troponin I (High Sensitivity)     Status: Abnormal   Collection Time: 07/23/23  7:52 AM  Result Value Ref Range   Troponin I (High Sensitivity) 296 (HH) <18 ng/L    Comment: CRITICAL RESULT CALLED TO, READ BACK BY AND VERIFIED WITH M,DOSS RN @0918  07/23/23 E,BENTON (NOTE) Elevated high sensitivity troponin I (hsTnI) values and significant  changes across serial measurements may suggest ACS but many other  chronic and acute conditions are known to elevate hsTnI results.  Refer to the "Links" section for chest pain algorithms and additional  guidance. Performed at [facility], [address], [place], [place] [address]   Sedimentation rate     Status: None   Collection Time: 07/23/23  9:20 AM  Result Value Ref Range   Sed Rate 3 0 - 22 mm/hr    Comment: Performed at [facility], [address], [place], [place] [address]  C-reactive protein     Status: Abnormal   Collection Time: 07/23/23  9:20 AM  Result Value Ref Range   CRP 1.1 (H) <1.0 mg/dL    Comment: Performed at [facility], [address], [place], [place] [address]  Lipid panel     Status: Abnormal   Collection Time: 07/23/23 10:00 AM  Result Value Ref Range   Cholesterol 180 0 - 200 mg/dL   Triglycerides  660 (H) <150 mg/dL   HDL 28 (L) >63 mg/dL   Total CHOL/HDL Ratio 6.4 RATIO   VLDL 51 (H) 0 - 40 mg/dL   LDL Cholesterol 016 (H) 0 - 99 mg/dL    Comment:        Total Cholesterol/HDL:CHD Risk Coronary Heart Disease Risk Table                     Men   Women  1/2 Average Risk   3.4   3.3  Average Risk       5.0   4.4  2 X Average Risk   9.6   7.1  3 X Average Risk  23.4   11.0        Use the calculated Patient Ratio above and the CHD Risk Table to determine the patient's CHD Risk.        ATP III CLASSIFICATION (LDL):  <100     mg/dL   Optimal  010-932  mg/dL   Near or Above                    Optimal  130-159  mg/dL   Borderline  355-732  mg/dL   High  >202     mg/dL   Very High Performed at [facility], [address] [place], [place] [address]   Troponin I (High Sensitivity)     Status: Abnormal   Collection Time: 07/23/23 10:00 AM  Result Value Ref Range   Troponin I (High Sensitivity) 900 (HH) <18 ng/L    Comment: CRITICAL VALUE NOTED. VALUE IS CONSISTENT WITH PREVIOUSLY REPORTED/CALLED VALUE (NOTE) Elevated high sensitivity troponin  I (hsTnI) values and significant  changes across serial measurements may suggest ACS but many other  chronic and acute conditions are known to elevate hsTnI results.  Refer to the "Links" section for chest pain algorithms and additional  guidance. Performed at [facility], [address], [place], [place] [address]   Heparin level (unfractionated)     Status: Abnormal   Collection Time: 07/23/23  1:45 PM  Result Value Ref Range   Heparin Unfractionated <0.10 (L) 0.30 - 0.70 IU/mL    Comment: (NOTE) The clinical reportable range upper limit is being lowered to >1.10 to align with the FDA approved guidance for the current laboratory assay.  If heparin results are below expected values, and patient dosage has  been confirmed, suggest follow up testing of antithrombin III levels. Performed at [facility], [address] [place], [place] [address]   HIV Antibody (routine testing w rflx)     Status: None   Collection Time: 07/23/23  1:45 PM  Result Value Ref Range   HIV Screen 4th Generation wRfx Non Reactive Non Reactive    Comment: Performed at [facility], [address] [place], [place] [address]  Hemoglobin A1c     Status: Abnormal   Collection Time: 07/23/23  1:45 PM  Result Value Ref Range   Hgb A1c MFr Bld 6.5 (H) 4.8 - 5.6 %    Comment: (NOTE) Pre diabetes:          5.7%-6.4%  Diabetes:              >6.4%  Glycemic control for   <7.0% adults with diabetes    Mean Plasma Glucose 139.85 mg/dL    Comment: Performed at [facility], [address] [place], [place] [address]  Troponin I (High Sensitivity)     Status: Abnormal   Collection Time: 07/23/23  1:45 PM  Result Value Ref Range   Troponin I (High Sensitivity) 3,410 (HH) <18 ng/L    Comment: CRITICAL VALUE NOTED. VALUE IS CONSISTENT WITH PREVIOUSLY REPORTED/CALLED VALUE (NOTE) Elevated high sensitivity troponin I (hsTnI) values and significant  changes across serial measurements may suggest ACS but many other  chronic and acute conditions are known to elevate hsTnI results.  Refer to the "Links" section for chest pain algorithms and additional  guidance. Performed at [facility], [address] [place], [place] [address]    ECHOCARDIOGRAM COMPLETE  Result Date: 07/23/2023    ECHOCARDIOGRAM REPORT   Patient Name:   Renee Ho Date of Exam: 07/23/2023 Medical Rec #:  [id number]                  Height:       65.0 in Accession #:    [id number]                 Weight:       260.0 lb Date of Birth:  11-21-1975                   BSA:          2.211 m Patient Age:    47 years                   BP:           140/70 mmHg Patient Gender: F  HR:           66 bpm. Exam Location:  Inpatient Procedure: 2D Echo, Color Doppler, Cardiac Doppler and Intracardiac             Opacification Agent Indications:    Acute myocardial infarction  History:        Patient has no prior history of Echocardiogram examinations.                 Risk Factors:Diabetes, Dyslipidemia, Hypertension, Former Smoker                 and Obstructive Sleep Apnea.  Sonographer:    Raeford Razor RDCS Referring Phys: [id number] [facility] A WILMOT  Sonographer Comments: Technically difficult study due to poor echo windows, suboptimal apical window and patient is obese. Image acquisition challenging due to patient body habitus. IMPRESSIONS  1. Left ventricular ejection fraction, by estimation, is 60 to 65%. The left ventricle has normal function. The left ventricle demonstrates regional wall motion abnormalities (see scoring diagram/findings for description). There is moderate asymmetric left ventricular hypertrophy of the septal segment. Left ventricular diastolic parameters are consistent with Grade I diastolic dysfunction (impaired relaxation).  2. Right ventricular systolic function is normal. The right ventricular size is normal. Tricuspid regurgitation signal is inadequate for assessing PA pressure.  3. The mitral valve is grossly normal. Trivial mitral valve regurgitation. No evidence of mitral stenosis.  4. The aortic valve is tricuspid. Aortic valve regurgitation is not visualized. No aortic stenosis is present.  5. The inferior vena cava is normal in size with greater than 50% respiratory variability, suggesting right atrial pressure of 3 mmHg. FINDINGS  Left Ventricle: Left ventricular ejection fraction, by estimation, is 60 to 65%. The left ventricle has normal function. The left ventricle demonstrates regional wall motion abnormalities. Definity contrast agent was given IV to delineate the left ventricular endocardial borders. The left ventricular internal cavity size was normal in size. There is moderate asymmetric left ventricular hypertrophy of the septal segment. Left ventricular diastolic parameters are  consistent with Grade I diastolic dysfunction (impaired relaxation).  LV Wall Scoring: The mid and distal lateral wall and mid anterolateral segment are hypokinetic. Right Ventricle: The right ventricular size is normal. No increase in right ventricular wall thickness. Right ventricular systolic function is normal. Tricuspid regurgitation signal is inadequate for assessing PA pressure. Left Atrium: Left atrial size was normal in size. Right Atrium: Right atrial size was normal in size. Pericardium: There is no evidence of pericardial effusion. Mitral Valve: The mitral valve is grossly normal. Trivial mitral valve regurgitation. No evidence of mitral valve stenosis. Tricuspid Valve: The tricuspid valve is normal in structure. Tricuspid valve regurgitation is not demonstrated. No evidence of tricuspid stenosis. Aortic Valve: The aortic valve is tricuspid. Aortic valve regurgitation is not visualized. No aortic stenosis is present. Aortic valve mean gradient measures 4.0 mmHg. Aortic valve peak gradient measures 8.2 mmHg. Pulmonic Valve: The pulmonic valve was normal in structure. Pulmonic valve regurgitation is not visualized. No evidence of pulmonic stenosis. Aorta: The aortic root is normal in size and structure. Venous: The inferior vena cava is normal in size with greater than 50% respiratory variability, suggesting right atrial pressure of 3 mmHg. IAS/Shunts: No atrial level shunt detected by color flow Doppler.  LEFT VENTRICLE PLAX 2D LVIDd:         5.60 cm     Diastology LVIDs:         2.60 cm     LV  e' medial:    4.13 cm/s LV PW:         0.90 cm     LV E/e' medial:  16.2 LV IVS:        1.50 cm     LV e' lateral:   5.33 cm/s LVOT diam:     2.00 cm     LV E/e' lateral: 12.6 LVOT Area:     3.14 cm  LV Volumes (MOD) LV vol d, MOD A2C: 90.0 ml LV vol d, MOD A4C: 97.8 ml LV vol s, MOD A2C: 16.1 ml LV vol s, MOD A4C: 30.5 ml LV SV MOD A2C:     73.9 ml LV SV MOD A4C:     97.8 ml LV SV MOD BP:      74.7 ml RIGHT  VENTRICLE            IVC RV S prime:     9.14 cm/s  IVC diam: 1.90 cm TAPSE (M-mode): 2.3 cm LEFT ATRIUM             Index        RIGHT ATRIUM           Index LA Vol (A2C):   47.3 ml 21.39 ml/m  RA Area:     16.30 cm LA Vol (A4C):   53.5 ml 24.19 ml/m  RA Volume:   43.40 ml  19.62 ml/m LA Biplane Vol: 52.9 ml 23.92 ml/m  AORTIC VALVE AV Area (Vmax): 2.39 cm AV Vmax:        143.00 cm/s AV Vmean:       96.200 cm/s AV VTI:         0.320 m AV Peak Grad:   8.2 mmHg AV Mean Grad:   4.0 mmHg LVOT Vmax:      109.00 cm/s  AORTA Ao Root diam: 3.20 cm Ao Asc diam:  3.20 cm MITRAL VALVE MV Area (PHT): 3.60 cm    SHUNTS MV Decel Time: 211 msec    Systemic Diam: 2.00 cm MV E velocity: 67.00 cm/s MV A velocity: 77.30 cm/s MV E/A ratio:  0.87 Weston Brass MD Electronically signed by Weston Brass MD Signature Date/Time: 07/23/2023/1:42:48 PM    Final    CT Angio Chest/Abd/Pel for Dissection W and/or W/WO  Result Date: 07/23/2023 CLINICAL DATA:  47 year old female with history of substernal chest pain for the past 2 hours radiating to the jaw. Indigestion. Acute aortic syndrome suspected. EXAM: CT ANGIOGRAPHY CHEST, ABDOMEN AND PELVIS TECHNIQUE: Non-contrast CT of the chest was initially obtained. Multidetector CT imaging through the chest, abdomen and pelvis was performed using the standard protocol during bolus administration of intravenous contrast. Multiplanar reconstructed images and MIPs were obtained and reviewed to evaluate the vascular anatomy. RADIATION DOSE REDUCTION: This exam was performed according to the departmental dose-optimization program which includes automated exposure control, adjustment of the mA and/or kV according to patient size and/or use of iterative reconstruction technique. CONTRAST:  75mL OMNIPAQUE IOHEXOL 350 MG/ML SOLN COMPARISON:  CT of the abdomen and pelvis 06/30/2023. Chest CT 04/05/2016. FINDINGS: CTA CHEST FINDINGS Cardiovascular: Precontrast images demonstrate no crescentic  high attenuation associated with the wall of the thoracic aorta to suggest the presence of acute intramural hemorrhage. Postcontrast images demonstrate no evidence of thoracic aortic aneurysm or dissection. Thoracic aorta is normal in caliber measuring 3.1 cm, 2.5 cm and 2.3 cm in diameter in the ascending, mid aortic arch and descending thoracic aorta respectively. There is aortic atherosclerosis, as well as  atherosclerosis of the great vessels of the mediastinum and the coronary arteries, including calcified atherosclerotic plaque in the left anterior descending, left circumflex and right coronary arteries. Heart size is normal. There is no significant pericardial fluid, thickening or pericardial calcification. Mediastinum/Nodes: No pathologically enlarged mediastinal or hilar lymph nodes. Esophagus is unremarkable in appearance. No axillary lymphadenopathy. Lungs/Pleura: No acute consolidative airspace disease. No pleural effusions. No suspicious appearing pulmonary nodules or masses are noted. Musculoskeletal: There are no aggressive appearing lytic or blastic lesions noted in the visualized portions of the skeleton. Review of the MIP images confirms the above findings. CTA ABDOMEN AND PELVIS FINDINGS VASCULAR Aorta: Normal caliber aorta without aneurysm, dissection, vasculitis or significant stenosis. Celiac: Patent without evidence of aneurysm, dissection, vasculitis or significant stenosis. SMA: Patent without evidence of aneurysm, dissection, vasculitis or significant stenosis. Renals: Both renal arteries are patent without evidence of aneurysm, dissection, vasculitis, fibromuscular dysplasia or significant stenosis. IMA: Patent without evidence of aneurysm, dissection, vasculitis or significant stenosis. Inflow: Patent without evidence of aneurysm, dissection, vasculitis or significant stenosis. Veins: No obvious venous abnormality within the limitations of this arterial phase study. Review of the MIP  images confirms the above findings. NON-VASCULAR Hepatobiliary: No suspicious cystic or solid hepatic lesions. No intra or extrahepatic biliary ductal dilatation. There is some amorphous intermediate attenuation material lying dependently in the gallbladder, likely biliary sludge. Gallbladder is moderately distended. No pericholecystic fluid or surrounding inflammatory changes. Pancreas: No pancreatic mass. No pancreatic ductal dilatation. No pancreatic or peripancreatic fluid collections or inflammatory changes. Spleen: Unremarkable. Adrenals/Urinary Tract: Bilateral kidneys and adrenal glands are normal in appearance. No hydroureteronephrosis urinary bladder is unremarkable in appearance. Stomach/Bowel: The appearance of the stomach is normal. No pathologic dilatation of small bowel or colon. Numerous colonic diverticuli are noted, without surrounding inflammatory changes to clearly indicate an acute diverticulitis at this time. Normal appendix. Lymphatic: No lymphadenopathy noted in the abdomen or pelvis. Reproductive: Status post hysterectomy. Right ovary is not confidently identified may be surgically absent or atrophic. Left ovary appears enlarged and is complex in appearance estimated to measure approximately 6.9 x 3.4 x 4.5 cm. Other: No significant volume of ascites.  No pneumoperitoneum. Musculoskeletal: There are no aggressive appearing lytic or blastic lesions noted in the visualized portions of the skeleton. Review of the MIP images confirms the above findings. IMPRESSION: 1. No acute findings noted in the chest, abdomen or pelvis to account for the patient's symptoms. Specifically, no evidence of acute aortic syndrome. 2. Small amount of biliary sludge lying dependently in the gallbladder. No findings to suggest an acute cholecystitis are noted at this time. 3. Enlarged complex appearing left ovary measuring 6.9 x 3.4 x 4.5 cm. Outpatient referral for nonemergent pelvic ultrasound is recommended in the  near future to better evaluate this finding and exclude underlying ovarian neoplasm. 4. Colonic diverticulosis without evidence of acute diverticulitis at this time. 5. Aortic atherosclerosis, in addition to three-vessel coronary artery disease. Please note that although the presence of coronary artery calcium documents the presence of coronary artery disease, the severity of this disease and any potential stenosis cannot be assessed on this non-gated CT examination. Assessment for potential risk factor modification, dietary therapy or pharmacologic therapy may be warranted, if clinically indicated. Electronically Signed   By: Trudie Reed M.D.   On: 07/23/2023 06:21   DG Chest Port 1 View  Result Date: 07/23/2023 CLINICAL DATA:  47 year old female with history of chest pain. EXAM: PORTABLE CHEST 1 VIEW COMPARISON:  Chest x-ray  10/18/2016. FINDINGS: Lung volumes are low. No consolidative airspace disease. No pleural effusions. No pneumothorax. No pulmonary nodule or mass noted. Pulmonary vasculature is normal. Heart size appears borderline enlarged, likely accentuated by low lung volumes and portable AP technique. IMPRESSION: 1. Low lung volumes without radiographic evidence of acute cardiopulmonary disease. Electronically Signed   By: Trudie Reed M.D.   On: 07/23/2023 05:04    Pending Labs Unresulted Labs (From admission, onward)     Start     Ordered   07/24/23 0500  Heparin level (unfractionated)  Daily,   R      07/23/23 0634   07/24/23 0500  CBC  Daily,   R      07/23/23 0634   07/24/23 0500  Basic metabolic panel  Tomorrow morning,   R        07/23/23 0944   07/23/23 2100  Heparin level (unfractionated)  Once-Timed,   TIMED        07/23/23 1438   07/23/23 1128  LDL cholesterol, direct  Once,   R        07/23/23 1128   07/23/23 1054  Urinalysis, Routine w reflex microscopic -Urine, Clean Catch  Once,   R       Question:  Specimen Source  Answer:  Urine, Clean Catch   07/23/23  1053   Signed and Held  Basic metabolic panel  Tomorrow morning,   R        Signed and Held   Signed and Held  CBC  Tomorrow morning,   R        Signed and Held            Vitals/Pain Today's Vitals   07/23/23 1117 07/23/23 1124 07/23/23 1440 07/23/23 1455  BP: 121/68  101/80 114/70  Pulse: (!) 54  (!) 57 (!) 58  Resp: 15   16  Temp: 97.9 F (36.6 C)     TempSrc: Temporal     SpO2: 99%  90% 97%  Weight:      Height:      PainSc: 4  4       Isolation Precautions No active isolations  Medications Medications  HYDROmorphone (DILAUDID) injection 1 mg (1 mg Intravenous Not Given 07/23/23 0635)  heparin ADULT infusion 100 units/mL (25000 units/23mL) (1,400 Units/hr Intravenous Infusion Verify 07/23/23 1455)  atorvastatin (LIPITOR) tablet 80 mg (80 mg Oral Given 07/23/23 0808)  metoprolol tartrate (LOPRESSOR) tablet 25 mg (25 mg Oral Given 07/23/23 0807)  ezetimibe (ZETIA) tablet 10 mg (10 mg Oral Given 07/23/23 1333)  nitroGLYCERIN 50 mg in dextrose 5 % 250 mL (0.2 mg/mL) infusion (10 mcg/min Intravenous Infusion Verify 07/23/23 1457)  perflutren lipid microspheres (DEFINITY) IV suspension (4 mLs Intravenous Given 07/23/23 0842)  promethazine (PHENERGAN) 12.5 mg in sodium chloride 0.9 % 50 mL IVPB (0 mg Intravenous Stopped 07/23/23 0956)  sodium chloride flush (NS) 0.9 % injection 3 mL (3 mLs Intravenous Given 07/23/23 1425)  acetaminophen (TYLENOL) tablet 650 mg (has no administration in time range)    Or  acetaminophen (TYLENOL) suppository 650 mg (has no administration in time range)  albuterol (PROVENTIL) (2.5 MG/3ML) 0.083% nebulizer solution 2.5 mg (has no administration in time range)  ondansetron (ZOFRAN) tablet 4 mg ( Oral See Alternative 07/23/23 1423)    Or  ondansetron (ZOFRAN) injection 4 mg (4 mg Intravenous Given 07/23/23 1423)  insulin glargine-yfgn (SEMGLEE) injection 50 Units (50 Units Subcutaneous Given 07/23/23 1332)  nicotine (NICODERM CQ - dosed in  mg/24 hours) patch 21 mg (21 mg Transdermal Patch Applied 07/23/23 1120)  DULoxetine (CYMBALTA) DR capsule 90 mg (90 mg Oral Given 07/23/23 1116)  dapagliflozin propanediol (FARXIGA) tablet 10 mg (10 mg Oral Given 07/23/23 1333)  fenofibrate tablet 160 mg (160 mg Oral Given 07/23/23 1333)  aspirin EC tablet 81 mg (has no administration in time range)  fentaNYL (SUBLIMAZE) injection 50 mcg (50 mcg Intravenous Given 07/23/23 0307)  ondansetron (ZOFRAN) injection 4 mg (4 mg Intravenous Given 07/23/23 0306)  HYDROmorphone (DILAUDID) injection 1 mg (1 mg Intravenous Given 07/23/23 0328)  iohexol (OMNIPAQUE) 350 MG/ML injection 75 mL (75 mLs Intravenous Contrast Given 07/23/23 0410)  pantoprazole (PROTONIX) injection 40 mg (40 mg Intravenous Given 07/23/23 0500)  HYDROmorphone (DILAUDID) injection 1 mg (1 mg Intravenous Given 07/23/23 0503)  nitroGLYCERIN (NITROGLYN) 2 % ointment 1 inch (1 inch Topical Given 07/23/23 0634)  heparin bolus via infusion 4,000 Units (4,000 Units Intravenous Bolus from Bag 07/23/23 0643)  ondansetron (ZOFRAN) injection 4 mg (4 mg Intravenous Given 07/23/23 0801)  heparin bolus via infusion 2,500 Units (2,500 Units Intravenous Bolus from Bag 07/23/23 1455)    Mobility walks with person assist     Focused Assessments Cardiac Assessment Handoff:  Cardiac Rhythm: Normal sinus rhythm Lab Results  Component Value Date   CKTOTAL 98 12/20/2007   CKMB 1.1 12/20/2007   TROPONINI 0.01        NO INDICATION OF MYOCARDIAL INJURY. 12/20/2007   Lab Results  Component Value Date   DDIMER (H) 12/19/2007    0.60        AT THE INHOUSE ESTABLISHED CUTOFF VALUE OF 0.48 ug/mL FEU, THIS ASSAY HAS BEEN DOCUMENTED IN THE LITERATURE TO HAVE   Does the Patient currently have chest pain? Yes    R Recommendations: See Admitting Provider Note  Report given to:   Additional Notes:  Chest pain 4/10

## 2023-07-23 NOTE — Progress Notes (Signed)
ANTICOAGULATION CONSULT NOTE - Follow-up Note  Pharmacy Consult for Heparin Indication: chest pain/ACS  Allergies  Allergen Reactions   Morphine And Codeine Other (See Comments)    Migraines   Reglan [Metoclopramide] Other (See Comments)    Psychotic episodes   Chantix [Varenicline] Other (See Comments)    Makes the patient angry feeling   Toradol [Ketorolac Tromethamine] Rash    Patient Measurements: Height: 5\' 5"  (165.1 cm) Weight: 117.9 kg (260 lb) IBW/kg (Calculated) : 57 Heparin Dosing Weight: 85.3 kg  Vital Signs: Temp: 97.9 F (36.6 C) (10/12 1117) Temp Source: Temporal (10/12 1117) BP: 121/68 (10/12 1117) Pulse Rate: 54 (10/12 1117)  Labs: Recent Labs    07/23/23 0310 07/23/23 0502 07/23/23 0752 07/23/23 1000  HGB 16.0*  --   --   --   HCT 46.6*  --   --   --   PLT 175  --   --   --   CREATININE 0.93  --   --   --   TROPONINIHS 27* 74* 296* 900*    Estimated Creatinine Clearance: 94 mL/min (by C-G formula based on SCr of 0.93 mg/dL).   Medical History: Past Medical History:  Diagnosis Date   Depression    Diabetes mellitus    Diabetes mellitus without complication (HCC)    Diverticula of intestine    High cholesterol    Hypertension    OSA on CPAP    CPAP pressure= 15   Polycystic ovarian syndrome    Sleep apnea    on CPAP with pressure setting= 15    Medications:  (Not in a hospital admission)  Scheduled:   atorvastatin  80 mg Oral Daily   dapagliflozin propanediol  10 mg Oral Daily   DULoxetine  90 mg Oral Daily   ezetimibe  10 mg Oral Daily   fenofibrate  160 mg Oral Daily    HYDROmorphone (DILAUDID) injection  1 mg Intravenous Once   insulin glargine-yfgn  50 Units Subcutaneous QHS   metoprolol tartrate  25 mg Oral BID   nicotine  21 mg Transdermal Daily   sodium chloride flush  3 mL Intravenous Q12H   Infusions:   heparin 1,100 Units/hr (07/23/23 0643)   nitroGLYCERIN 10 mcg/min (07/23/23 1247)   promethazine (PHENERGAN)  injection (IM or IVPB) Stopped (07/23/23 0956)   PRN: acetaminophen **OR** acetaminophen, albuterol, ondansetron **OR** ondansetron (ZOFRAN) IV, promethazine (PHENERGAN) injection (IM or IVPB)  Assessment: 1 yof with a history of DM, morbid obesity, HLD and chronic pancreatitis . Patient is presenting with chest pain. Heparin per pharmacy consult placed for chest pain/ACS. Cardiology tentatively planning for cath on 10/14.  Patient was not on anticoagulation prior to arrival. Started on 1100 units/hr IV heparin following 4000 unit IV heparin bolus. Resulting heparin level is < 0.1 which is sub-therapeutic.  No issues with infusion or bleeding per RN.  Hgb 16; plt 175  Goal of Therapy:  Heparin level 0.3-0.7 units/ml Monitor platelets by anticoagulation protocol: Yes   Plan:  Give IV heparin 2500 units bolus x 1 Increase heparin infusion to 1400 units/hr Check anti-Xa level at 2100 and daily while on heparin Continue to monitor H&H and platelets  Delmar Landau, PharmD, BCPS 07/23/2023 1:59 PM ED Clinical Pharmacist -  [phone]

## 2023-07-23 NOTE — Progress Notes (Signed)
   Patient Name: Renee Ho Date of Encounter: 07/23/2023 [facility] Cardiologist: None  Interval Summary  .    Reevaluated patient due to uptrending troponins and chest pain Chest pain 4 out of 10.   Substernally. Remains on IV heparin and nitro drip Hemodynamically stable. Husband at bedside  Vital Signs .    Vitals:   07/23/23 1115 07/23/23 1117 07/23/23 1440 07/23/23 1455  BP: 121/68 121/68 101/80 114/70  Pulse: (!) 59 (!) 54 (!) 57 (!) 58  Resp: 16 15  16   Temp:  97.9 F (36.6 C)    TempSrc:  Temporal    SpO2: 100% 99% 90% 97%  Weight:      Height:        Intake/Output Summary (Last 24 hours) at 07/23/2023 1535 Last data filed at 07/23/2023 1457 Gross per 24 hour  Intake 178.56 ml  Output --  Net 178.56 ml      07/23/2023    2:58 AM 06/30/2023    1:01 AM 03/22/2023    1:35 PM  Last 3 Weights  Weight (lbs) 260 lb 260 lb 271 lb 11.2 oz  Weight (kg) 117.935 kg 117.935 kg 123.242 kg      Telemetry/ECG    Sinus- Personally Reviewed  Physical Exam .   No change in physical examination compared up this morning Assessment & Plan .     NSTEMI  Continues to have chest pain despite being on IV heparin and nitro drip. Troponins trending up Cardiac Panel (last 3 results) Recent Labs    07/23/23 0752 07/23/23 1000 07/23/23 1345  TROPONINIHS 296* 900* 3,410*  Spoke to Dr. Clifton James interventional cardiology on-call. Given her presentation symptoms and rising troponins.   Shared decision was to proceed with urgent left heart catheterization with possible intervention.  Informed Consent   Shared Decision Making/Informed Consent The risks [stroke (1 in 1000), death (1 in 1000), kidney failure [usually temporary] (1 in 500), bleeding (1 in 200), allergic reaction [possibly serious] (1 in 200)], benefits (diagnostic support and management of coronary artery disease) and alternatives of a cardiac catheterization were discussed in detail  with [name] and she is willing to proceed.     CRITICAL CARE Performed by: Tessa Lerner, DO, [facility]   Total critical care time: 35 minutes   Critical care time was exclusive of separately billable procedures and treating other patients.   Critical care was necessary to treat or prevent imminent or life-threatening deterioration due to evolving NSTEMI.   Critical care was time spent personally by me on the following activities: development of treatment plan with patient and/or surrogate as well as nursing, discussions with consultants (interventional cardiology), evaluation of patient's response to treatment, examination of patient, updating her husband, ordering and performing treatments and interventions, ordering and review of laboratory studies, ordering and review of radiographic studies, pulse oximetry and re-evaluation of patient's condition.  For questions or updates, please contact [facility] Please consult [email or web address] for contact info under      Signed, Tessa Lerner, DO, [facility]  [facility]  [address] [place], [place] [address] Pager: [phone] Office: [phone]

## 2023-07-24 DIAGNOSIS — I214 Non-ST elevation (NSTEMI) myocardial infarction: Secondary | ICD-10-CM | POA: Diagnosis not present

## 2023-07-24 DIAGNOSIS — I25110 Atherosclerotic heart disease of native coronary artery with unstable angina pectoris: Secondary | ICD-10-CM

## 2023-07-24 DIAGNOSIS — Z955 Presence of coronary angioplasty implant and graft: Secondary | ICD-10-CM | POA: Diagnosis not present

## 2023-07-24 DIAGNOSIS — I700 Atherosclerosis of aorta: Secondary | ICD-10-CM

## 2023-07-24 DIAGNOSIS — I2510 Atherosclerotic heart disease of native coronary artery without angina pectoris: Secondary | ICD-10-CM

## 2023-07-24 DIAGNOSIS — I2584 Coronary atherosclerosis due to calcified coronary lesion: Secondary | ICD-10-CM

## 2023-07-24 DIAGNOSIS — F17210 Nicotine dependence, cigarettes, uncomplicated: Secondary | ICD-10-CM | POA: Diagnosis not present

## 2023-07-24 LAB — CBC
HCT: 47.5 % — ABNORMAL HIGH (ref 36.0–46.0)
Hemoglobin: 16 g/dL — ABNORMAL HIGH (ref 12.0–15.0)
MCH: 30.9 pg (ref 26.0–34.0)
MCHC: 33.7 g/dL (ref 30.0–36.0)
MCV: 91.7 fL (ref 80.0–100.0)
Platelets: 166 10*3/uL (ref 150–400)
RBC: 5.18 MIL/uL — ABNORMAL HIGH (ref 3.87–5.11)
RDW: 13.9 % (ref 11.5–15.5)
WBC: 10.9 10*3/uL — ABNORMAL HIGH (ref 4.0–10.5)
nRBC: 0 % (ref 0.0–0.2)

## 2023-07-24 LAB — BASIC METABOLIC PANEL
Anion gap: 10 (ref 5–15)
BUN: 17 mg/dL (ref 6–20)
CO2: 23 mmol/L (ref 22–32)
Calcium: 9 mg/dL (ref 8.9–10.3)
Chloride: 103 mmol/L (ref 98–111)
Creatinine, Ser: 0.83 mg/dL (ref 0.44–1.00)
GFR, Estimated: >60 mL/min (ref >60)
Glucose, Bld: 139 mg/dL — ABNORMAL HIGH (ref 70–99)
Potassium: 3.9 mmol/L (ref 3.5–5.1)
Sodium: 136 mmol/L (ref 135–145)

## 2023-07-24 LAB — GLUCOSE, CAPILLARY
Glucose-Capillary: 133 mg/dL — ABNORMAL HIGH (ref 70–99)
Glucose-Capillary: 122 mg/dL — ABNORMAL HIGH (ref 70–99)
Glucose-Capillary: 122 mg/dL — ABNORMAL HIGH (ref 70–99)

## 2023-07-24 LAB — TROPONIN I (HIGH SENSITIVITY): Troponin I (High Sensitivity): 6603 ng/L (ref <18)

## 2023-07-24 MED ORDER — INSULIN ASPART 100 UNIT/ML IJ SOLN
0.0000 [IU] | INTRAMUSCULAR | Status: DC
  Administered 2023-07-24 (×2): 3 [IU] via SUBCUTANEOUS

## 2023-07-24 MED ORDER — LOSARTAN POTASSIUM 25 MG PO TABS: 12.5000 mg | ORAL_TABLET | Freq: Every day | ORAL | Status: DC

## 2023-07-24 MED ORDER — INSULIN ASPART 100 UNIT/ML IJ SOLN
0.0000 [IU] | Freq: Three times a day (TID) | INTRAMUSCULAR | Status: DC
  Administered 2023-07-26: 3 [IU] via SUBCUTANEOUS

## 2023-07-24 MED ORDER — TRAMADOL HCL 50 MG PO TABS
50.0000 mg | ORAL_TABLET | Freq: Four times a day (QID) | ORAL | Status: DC | PRN
  Administered 2023-07-24 – 2023-07-27 (×8): 50 mg via ORAL
  Filled 2023-07-24 (×8): qty 1

## 2023-07-24 MED ORDER — LISINOPRIL 5 MG PO TABS
5.0000 mg | ORAL_TABLET | Freq: Every day | ORAL | Status: DC
  Administered 2023-07-24 – 2023-07-27 (×3): 5 mg via ORAL
  Filled 2023-07-24 (×4): qty 1

## 2023-07-24 NOTE — Progress Notes (Signed)
Report called to receiving Rn [phone]. Patient with no complaints at this time. Will transfer via WC.

## 2023-07-24 NOTE — Progress Notes (Signed)
Rounding Note    Patient Name: Renee Ho Date of Encounter: 07/24/2023  [facility] Cardiologist: None  Subjective   No longer experiencing chest pain. Nitro drip was discontinued at 2 AM this morning. Patient currently resting in bed with her CPAP  Inpatient Medications    Scheduled Meds:  aspirin EC  81 mg Oral Daily   atorvastatin  80 mg Oral Daily   Chlorhexidine Gluconate Cloth  6 each Topical Daily   dapagliflozin propanediol  10 mg Oral Daily   DULoxetine  90 mg Oral Daily   ezetimibe  10 mg Oral Daily   fenofibrate  160 mg Oral Daily    HYDROmorphone (DILAUDID) injection  1 mg Intravenous Once   insulin aspart  0-20 Units Subcutaneous Q4H   insulin glargine-yfgn  50 Units Subcutaneous QHS   lisinopril  5 mg Oral Daily   melatonin  3 mg Oral QHS   metoprolol tartrate  25 mg Oral BID   nicotine  21 mg Transdermal Daily   sodium chloride flush  3 mL Intravenous Q12H   sodium chloride flush  3 mL Intravenous Q12H   ticagrelor  90 mg Oral BID   Continuous Infusions:  sodium chloride     nitroGLYCERIN Stopped (07/24/23 0214)   promethazine (PHENERGAN) injection (IM or IVPB) Stopped (07/23/23 0956)   PRN Meds: sodium chloride, acetaminophen, albuterol, ondansetron (ZOFRAN) IV, promethazine (PHENERGAN) injection (IM or IVPB), sodium chloride flush   Vital Signs    Vitals:   07/24/23 0500 07/24/23 0612 07/24/23 0800 07/24/23 1003  BP: 102/61 (!) 102/46 126/78 116/62  Pulse: (!) 57 (!) 53  (!) 56  Resp: (!) 0 16 16 17   Temp:      TempSrc:   Oral   SpO2: 95% 96% 97% 96%  Weight:      Height:        Intake/Output Summary (Last 24 hours) at 07/24/2023 1050 Last data filed at 07/24/2023 0800 Gross per 24 hour  Intake 1443.12 ml  Output --  Net 1443.12 ml      07/23/2023    5:43 PM 07/23/2023    2:58 AM 06/30/2023    1:01 AM  Last 3 Weights  Weight (lbs) 255 lb 11.7 oz 260 lb 260 lb  Weight (kg) 116 kg 117.935 kg 117.935  kg      Telemetry    NSR w/o ectopy  - Personally Reviewed  ECG    07/24/2023: Normal sinus rhythm, 64 bpm, low voltage, nonspecific T wave abnormality, prolonged QT. - Personally Reviewed  Physical Exam  Vital signs noted above.   Physical Exam  Constitutional: No distress.  hemodynamically stable  Neck: No JVD present.  Cardiovascular: Normal rate, regular rhythm, S1 normal, S2 normal, intact distal pulses and normal pulses. Exam reveals no gallop, no S3 and no S4.  No murmur heard. Pulses:      Dorsalis pedis pulses are 2+ on the right side and 2+ on the left side.       Posterior tibial pulses are 2+ on the right side and 2+ on the left side.  Pulmonary/Chest: Effort normal and breath sounds normal. No stridor. She has no wheezes. She has no rales.  Abdominal: Soft. Bowel sounds are normal. She exhibits no distension. There is no abdominal tenderness.  Obese  Musculoskeletal:        General: No edema.     Cervical back: Neck supple.  Neurological: She is alert and oriented to person, place,  and time. She has intact cranial nerves (2-12).  Skin: Skin is warm and moist.    Labs    High Sensitivity Troponin:   Recent Labs  Lab 07/23/23 0502 07/23/23 0752 07/23/23 1000 07/23/23 1345 07/24/23 0631  TROPONINIHS 74* 296* 900* 3,410* 6,603*     Chemistry Recent Labs  Lab 07/23/23 0310 07/24/23 0340  NA 137 136  K 3.7 3.9  CL 97* 103  CO2 24 23  GLUCOSE 254* 139*  BUN 20 17  CREATININE 0.93 0.83  CALCIUM 9.5 9.0  PROT 6.5  --   ALBUMIN 3.6  --   AST 21  --   ALT 12  --   ALKPHOS 50  --   BILITOT 1.1  --   GFRNONAA >60 >60  ANIONGAP 16* 10    Lipids  Recent Labs  Lab 07/23/23 1000  CHOL 180  TRIG 253*  HDL 28*  LDLCALC 101*  CHOLHDL 6.4    Hematology Recent Labs  Lab 07/23/23 0310 07/24/23 0340  WBC 10.7* 10.9*  RBC 5.22* 5.18*  HGB 16.0* 16.0*  HCT 46.6* 47.5*  MCV 89.3 91.7  MCH 30.7 30.9  MCHC 34.3 33.7  RDW 13.5 13.9  PLT 175 166    Thyroid No results for input(s): "TSH", "FREET4" in the last 168 hours.  BNPNo results for input(s): "BNP", "PROBNP" in the last 168 hours.  DDimer No results for input(s): "DDIMER" in the last 168 hours.   Radiology    CTA chest abdomen pelvis for dissection with and without contrast protocol: 07/23/2023 1. No acute findings noted in the chest, abdomen or pelvis to account for the patient's symptoms. Specifically, no evidence of acute aortic syndrome.  2. Small amount of biliary sludge lying dependently in the gallbladder. No findings to suggest an acute cholecystitis are noted at this time.  3. Enlarged complex appearing left ovary measuring 6.9 x 3.4 x 4.5 cm. Outpatient referral for nonemergent pelvic ultrasound is recommended in the near future to better evaluate this finding and exclude underlying ovarian neoplasm.  4. Colonic diverticulosis without evidence of acute diverticulitis at this time.  5. Aortic atherosclerosis, in addition to three-vessel coronary artery disease. Please note that although the presence of coronary artery calcium documents the presence of coronary artery disease, the severity of this disease and any potential stenosis cannot be assessed on this non-gated CT examination. Assessment for potential risk factor modification, dietary therapy or pharmacologic therapy may be warranted, if clinically indicated. Electronically Signed By: Trudie Reed M.D. On: 07/23/2023 06:21   Chest x-ray 07/23/2023: 1. Low lung volumes without radiographic evidence of acute cardiopulmonary disease.   Cardiac Studies   Echo  07/23/2023  1. Left ventricular ejection fraction, by estimation, is 60 to 65%. The left ventricle has normal function. The left ventricle demonstrates regional wall motion abnormalities (The mid and distal lateral wall and mid anterolateral segment are  hypokinetic. ). There is moderate asymmetric left ventricular hypertrophy of the septal segment. Left  ventricular  diastolic parameters are consistent with Grade I diastolic dysfunction (impaired relaxation).   2. Right ventricular systolic function is normal. The right ventricular size is normal. Tricuspid regurgitation signal is inadequate for assessing PA pressure.   3. The mitral valve is grossly normal. Trivial mitral valve  regurgitation. No evidence of mitral stenosis.   4. The aortic valve is tricuspid. Aortic valve regurgitation is not visualized. No aortic stenosis is present.   5. The inferior vena cava is normal in size with  greater than 50%  respiratory variability, suggesting right atrial pressure of 3 mmHg.   LHC 07/23/2023   Prox Cx to Mid Cx lesion is 100% stenosed.   Mid LAD lesion is 50% stenosed.   Prox LAD to Mid LAD lesion is 70% stenosed.   1st Diag lesion is 50% stenosed.   2nd Diag lesion is 90% stenosed.   Mid LAD to Dist LAD lesion is 50% stenosed.   Mid RCA lesion is 60% stenosed.   Dist RCA lesion is 40% stenosed.   A drug-eluting stent was successfully placed using a SYNERGY XD 3.0X12.   Post intervention, there is a 0% residual stenosis.   NSTEMI secondary to thrombotic occlusion of the proximal Circumflex artery. Collateral filling of the distal Circumflex and OM branches from right to left collaterals. Successful PTCA/DES x 1 proximal Circumflex Severe stenosis in the heavily calcified mid LAD involving two diagonal branches Moderate non-obstructive disease in the large dominant RCA LVEDP 11 mmHg   Recommendations: Admit to the ICU. Aggrastat drip for 2 hours. DAPT with ASA and Brilinta for one year. High intensity statin. Echo tomorrow. Will review the lesions in the LAD with the IC team. I would favor medical management of her LAD disease for now. PCI of the proximal to mid LAD would potentially jeopardize flow into the Diagonal branches which are moderate to large in caliber. Could consider bypass after 3 months of DAPT.   Patient Profile     47 y.o.  female history of insulin-dependent diabetes mellitus type 2, severe dyslipidemia, hypertriglyceridemia with chronic pancreatitis, coronary artery calcification on nongated CT study, cigarette smoker, morbid obesity.  Presents to the hospital with a chief complaint of chest pain.  Patient ruled in for NSTEMI and was started on IV heparin and nitro drips.  Continue to have precordial chest pain and rising troponins and was taken to the Cath Lab urgently due to concerns for evolving ACS.  Assessment & Plan    Assessment: NSTEMI-status post thrombotic occlusion of the proximal LCx with successful PTCA/DES x1 07/23/2023 Multivessel CAD Coronary artery calcification on nongated CT study Dyslipidemia. Hypertriglyceridemia. Insulin-dependent diabetes mellitus type 2. Morbid obesity-BMI 42.5 Cigarette smoking. Sleep apnea on CPAP Enlarged left ovary  Plan: NSTEMI-status post thrombotic occlusion of the proximal LCx with successful PTCA/DES x1 07/23/2023 Multivessel CAD Coronary artery calcification on nongated CT study Patient ruled in for NSTEMI but despite being on IV heparin drip and nitro drip she continued to have chest pain and high sensitive troponin continued to climb.  Patient was taken the Cath Lab urgently to evaluate for obstructive disease.  She is noted to have multivessel CAD and a culprit lesion was in the proximal LCx and she underwent PTCA/DES. The LAD disease and RCA disease are currently being treated medically as per interventional cardiology recommendations. Tentatively scheduled to discuss her case at the heart care meeting. For now the recommendation is to continue medical therapy for her LAD/RCA disease.  However if she has pain despite antianginal therapy she would benefit from CABG given her comorbidities. Nitro was discontinued earlier this morning at 2 AM. Currently denies anginal chest pain. EKG is nonischemic. No arrhythmia on telemetry. Has been started on multiple  new medications. Continue dual antiplatelet therapy and high intensity statin therapy Shared decision was to monitor her for another 24 hours to make sure she does not have any reoccurrence of chest pain and to see what the recommendations are from heart care team (interventional cardiology). Patient is stable  to be transferred to floors.  Dyslipidemia: Hypertriglyceridemia: Lipids are not at goal. Would recommend an LDL <55 mg/dL in the setting of multivessel CAD, premature coronary artery disease, and insulin-dependent diabetes Continues high intensity statin therapy as well as fenofibrate. Recommend fasting lipid profile and direct LDL in 6 weeks to reevaluate therapy and LFTs.  Insulin-dependent diabetes mellitus type 2: Nursing staff requesting orders for checking sugars as well as insulin coverage. Will reach out to internal medicine to see if they can resume care with regards to noncardiac comorbidities. Hemoglobin A1c 6.5 as of October 2024  Cigarette smoking: Tobacco cessation counseling: Currently smoking 1 packs/day   She is informed of the dangers of tobacco abuse including stroke, cancer, and MI, as well as benefits of tobacco cessation. She is willing to quit at this time. 5 mins were spent counseling patient cessation techniques. We discussed various methods to help quit smoking, including deciding on a date to quit, joining a support group, pharmacological agents- nicotine gum/patch/lozenges.   Sleep apnea on CPAP: Continue device therapy.  Enlarged left ovary: Will need outpatient workup.  Will defer to internal medicine for now.  Patient is aware of these findings and the importance of outpatient follow-up.  Will continue to follow the patient with you -reevaluate chest pain and/or IC heart care team recommendations.  At the time of discharge recommend follow-up with PCP (patient goes to [facility] primary care) and with cardiology either myself or APP in 2  weeks.  She was admitted to internal medicine but will make sure they will reassume primary care during this hospitalization. Non-cardiac care management per IM.   For questions or updates, please contact [facility] Please consult [email or web address] for contact info under        Signed, Tessa Lerner, DO, [facility]  [facility]  [address] [place], [place] [address] Pager: [phone] Office: [phone] 07/24/2023, 10:50 AM

## 2023-07-24 NOTE — Progress Notes (Signed)
   07/24/23 2100  BiPAP/CPAP/SIPAP  BiPAP/CPAP/SIPAP Pt Type Adult  BiPAP/CPAP/SIPAP Resmed  Mask Type  (pt has her own mask from home, states she does not need help with it. she has worn the CPAP for 20 years.)

## 2023-07-25 ENCOUNTER — Other Ambulatory Visit (HOSPITAL_COMMUNITY): Payer: Self-pay

## 2023-07-25 ENCOUNTER — Encounter (HOSPITAL_COMMUNITY): Payer: Self-pay | Admitting: Cardiovascular Disease

## 2023-07-25 DIAGNOSIS — D751 Secondary polycythemia: Secondary | ICD-10-CM | POA: Diagnosis not present

## 2023-07-25 DIAGNOSIS — I214 Non-ST elevation (NSTEMI) myocardial infarction: Secondary | ICD-10-CM | POA: Diagnosis not present

## 2023-07-25 DIAGNOSIS — N838 Other noninflammatory disorders of ovary, fallopian tube and broad ligament: Secondary | ICD-10-CM

## 2023-07-25 DIAGNOSIS — D72829 Elevated white blood cell count, unspecified: Secondary | ICD-10-CM | POA: Diagnosis not present

## 2023-07-25 LAB — CBC
HCT: 48.2 % — ABNORMAL HIGH (ref 36.0–46.0)
Hemoglobin: 16.1 g/dL — ABNORMAL HIGH (ref 12.0–15.0)
MCH: 30.6 pg (ref 26.0–34.0)
MCHC: 33.4 g/dL (ref 30.0–36.0)
MCV: 91.6 fL (ref 80.0–100.0)
Platelets: 164 10*3/uL (ref 150–400)
RBC: 5.26 MIL/uL — ABNORMAL HIGH (ref 3.87–5.11)
RDW: 13.8 % (ref 11.5–15.5)
WBC: 8.8 10*3/uL (ref 4.0–10.5)
nRBC: 0 % (ref 0.0–0.2)

## 2023-07-25 LAB — BASIC METABOLIC PANEL
Anion gap: 9 (ref 5–15)
BUN: 18 mg/dL (ref 6–20)
CO2: 25 mmol/L (ref 22–32)
Calcium: 9.1 mg/dL (ref 8.9–10.3)
Chloride: 105 mmol/L (ref 98–111)
Creatinine, Ser: 0.82 mg/dL (ref 0.44–1.00)
GFR, Estimated: >60 mL/min (ref >60)
Glucose, Bld: 118 mg/dL — ABNORMAL HIGH (ref 70–99)
Potassium: 3.9 mmol/L (ref 3.5–5.1)
Sodium: 139 mmol/L (ref 135–145)

## 2023-07-25 LAB — GLUCOSE, CAPILLARY
Glucose-Capillary: 118 mg/dL — ABNORMAL HIGH (ref 70–99)
Glucose-Capillary: 108 mg/dL — ABNORMAL HIGH (ref 70–99)
Glucose-Capillary: 119 mg/dL — ABNORMAL HIGH (ref 70–99)
Glucose-Capillary: 134 mg/dL — ABNORMAL HIGH (ref 70–99)

## 2023-07-25 LAB — TROPONIN I (HIGH SENSITIVITY)
Troponin I (High Sensitivity): 3412 ng/L (ref <18)
Troponin I (High Sensitivity): 4022 ng/L (ref <18)

## 2023-07-25 MED ORDER — NITROGLYCERIN 0.4 MG SL SUBL
0.4000 mg | SUBLINGUAL_TABLET | SUBLINGUAL | Status: DC | PRN
  Administered 2023-07-25 (×2): 0.4 mg via SUBLINGUAL
  Filled 2023-07-25: qty 1

## 2023-07-25 MED ORDER — ENOXAPARIN SODIUM 60 MG/0.6ML IJ SOSY
60.0000 mg | PREFILLED_SYRINGE | INTRAMUSCULAR | Status: DC
  Administered 2023-07-25 – 2023-07-26 (×2): 60 mg via SUBCUTANEOUS
  Filled 2023-07-25 (×2): qty 0.6

## 2023-07-25 MED ORDER — NITROGLYCERIN IN D5W 200-5 MCG/ML-% IV SOLN
0.0000 ug/min | INTRAVENOUS | Status: DC
  Administered 2023-07-25: 5 ug/min via INTRAVENOUS
  Filled 2023-07-25: qty 250

## 2023-07-25 MED ORDER — ISOSORBIDE MONONITRATE ER 30 MG PO TB24
15.0000 mg | ORAL_TABLET | Freq: Every day | ORAL | Status: DC
  Filled 2023-07-25: qty 1

## 2023-07-25 NOTE — Progress Notes (Addendum)
Rounding Note    Patient Name: Renee Ho Date of Encounter: 07/25/2023  [facility] Cardiologist: Tessa Lerner, DO   Subjective   I was called to bedside for 7/10 chest pain similar to her presentation. EKG does not show ischemic changes. She has received NTG SL x 2. Will start nitroglycerin gtt. I will obtain additional 2 troponin values to see if she is trending up - expect these to be elevated after PCI, but should be trending down if no new ischemia. BP stable, HR sinus in the 60s.   Inpatient Medications    Scheduled Meds:  aspirin EC  81 mg Oral Daily   atorvastatin  80 mg Oral Daily   dapagliflozin propanediol  10 mg Oral Daily   DULoxetine  90 mg Oral Daily   ezetimibe  10 mg Oral Daily   fenofibrate  160 mg Oral Daily    HYDROmorphone (DILAUDID) injection  1 mg Intravenous Once   insulin aspart  0-20 Units Subcutaneous TID WC   insulin glargine-yfgn  50 Units Subcutaneous QHS   lisinopril  5 mg Oral Daily   melatonin  3 mg Oral QHS   metoprolol tartrate  25 mg Oral BID   nicotine  21 mg Transdermal Daily   sodium chloride flush  3 mL Intravenous Q12H   ticagrelor  90 mg Oral BID   Continuous Infusions:  nitroGLYCERIN 10 mcg/min (07/25/23 0922)   promethazine (PHENERGAN) injection (IM or IVPB) Stopped (07/23/23 0956)   PRN Meds: acetaminophen, albuterol, ondansetron (ZOFRAN) IV, promethazine (PHENERGAN) injection (IM or IVPB), sodium chloride flush, traMADol   Vital Signs    Vitals:   07/25/23 0810 07/25/23 0853 07/25/23 0857 07/25/23 0859  BP: (!) 152/58 (!) 135/100 138/76   Pulse: 67   68  Resp: 18     Temp:      TempSrc:      SpO2:      Weight:      Height:        Intake/Output Summary (Last 24 hours) at 07/25/2023 0923 Last data filed at 07/24/2023 1600 Gross per 24 hour  Intake 720 ml  Output --  Net 720 ml      07/23/2023    5:43 PM 07/23/2023    2:58 AM 06/30/2023    1:01 AM  Last 3 Weights  Weight (lbs) 255  lb 11.7 oz 260 lb 260 lb  Weight (kg) 116 kg 117.935 kg 117.935 kg      Telemetry    Sinus rhythm in the 60s - Personally Reviewed  ECG    Sinus rhythm HR 72 - Personally Reviewed  Physical Exam   GEN: No acute distress.   Neck: No JVD Cardiac: RRR, no murmurs, rubs, or gallops, mildly tender on palpation, but also CP without palpation Respiratory: Clear to auscultation bilaterally. GI: Soft, nontender, non-distended  MS: No edema; No deformity. Neuro:  Nonfocal  Psych: Normal affect   Labs    High Sensitivity Troponin:   Recent Labs  Lab 07/23/23 0502 07/23/23 0752 07/23/23 1000 07/23/23 1345 07/24/23 0631  TROPONINIHS 74* 296* 900* 3,410* 6,603*     Chemistry Recent Labs  Lab 07/23/23 0310 07/24/23 0340 07/25/23 0459  NA 137 136 139  K 3.7 3.9 3.9  CL 97* 103 105  CO2 24 23 25   GLUCOSE 254* 139* 118*  BUN 20 17 18   CREATININE 0.93 0.83 0.82  CALCIUM 9.5 9.0 9.1  PROT 6.5  --   --  ALBUMIN 3.6  --   --   AST 21  --   --   ALT 12  --   --   ALKPHOS 50  --   --   BILITOT 1.1  --   --   GFRNONAA >60 >60 >60  ANIONGAP 16* 10 9    Lipids  Recent Labs  Lab 07/23/23 1000  CHOL 180  TRIG 253*  HDL 28*  LDLCALC 101*  CHOLHDL 6.4    Hematology Recent Labs  Lab 07/23/23 0310 07/24/23 0340 07/25/23 0459  WBC 10.7* 10.9* 8.8  RBC 5.22* 5.18* 5.26*  HGB 16.0* 16.0* 16.1*  HCT 46.6* 47.5* 48.2*  MCV 89.3 91.7 91.6  MCH 30.7 30.9 30.6  MCHC 34.3 33.7 33.4  RDW 13.5 13.9 13.8  PLT 175 166 164   Thyroid No results for input(s): "TSH", "FREET4" in the last 168 hours.  BNPNo results for input(s): "BNP", "PROBNP" in the last 168 hours.  DDimer No results for input(s): "DDIMER" in the last 168 hours.   Radiology    CARDIAC CATHETERIZATION  Result Date: 07/23/2023   Prox Cx to Mid Cx lesion is 100% stenosed.   Mid LAD lesion is 50% stenosed.   Prox LAD to Mid LAD lesion is 70% stenosed.   1st Diag lesion is 50% stenosed.   2nd Diag lesion is 90%  stenosed.   Mid LAD to Dist LAD lesion is 50% stenosed.   Mid RCA lesion is 60% stenosed.   Dist RCA lesion is 40% stenosed.   A drug-eluting stent was successfully placed using a SYNERGY XD 3.0X12.   Post intervention, there is a 0% residual stenosis. NSTEMI secondary to thrombotic occlusion of the proximal Circumflex artery. Collateral filling of the distal Circumflex and OM branches from right to left collaterals. Successful PTCA/DES x 1 proximal Circumflex Severe stenosis in the heavily calcified mid LAD involving two diagonal branches Moderate non-obstructive disease in the large dominant RCA LVEDP 11 mmHg Recommendations: Admit to the ICU. Aggrastat drip for 2 hours. DAPT with ASA and Brilinta for one year. High intensity statin. Echo tomorrow. Will review the lesions in the LAD with the IC team. I would favor medical management of her LAD disease for now. PCI of the proximal to mid LAD would potentially jeopardize flow into the Diagonal branches which are moderate to large in caliber. Could consider bypass after 3 months of DAPT.    Cardiac Studies   LHC 07/23/23:   Prox Cx to Mid Cx lesion is 100% stenosed.   Mid LAD lesion is 50% stenosed.   Prox LAD to Mid LAD lesion is 70% stenosed.   1st Diag lesion is 50% stenosed.   2nd Diag lesion is 90% stenosed.   Mid LAD to Dist LAD lesion is 50% stenosed.   Mid RCA lesion is 60% stenosed.   Dist RCA lesion is 40% stenosed.   A drug-eluting stent was successfully placed using a SYNERGY XD 3.0X12.   Post intervention, there is a 0% residual stenosis.   NSTEMI secondary to thrombotic occlusion of the proximal Circumflex artery. Collateral filling of the distal Circumflex and OM branches from right to left collaterals. Successful PTCA/DES x 1 proximal Circumflex Severe stenosis in the heavily calcified mid LAD involving two diagonal branches Moderate non-obstructive disease in the large dominant RCA LVEDP 11 mmHg   Recommendations: Admit to the  ICU. Aggrastat drip for 2 hours. DAPT with ASA and Brilinta for one year. High intensity statin. Echo tomorrow.  Will review the lesions in the LAD with the IC team. I would favor medical management of her LAD disease for now. PCI of the proximal to mid LAD would potentially jeopardize flow into the Diagonal branches which are moderate to large in caliber. Could consider bypass after 3 months of DAPT.    Echo 07/23/23: 1. Left ventricular ejection fraction, by estimation, is 60 to 65%. The  left ventricle has normal function. The left ventricle demonstrates  regional wall motion abnormalities (see scoring diagram/findings for  description). There is moderate asymmetric  left ventricular hypertrophy of the septal segment. Left ventricular  diastolic parameters are consistent with Grade I diastolic dysfunction  (impaired relaxation).   2. Right ventricular systolic function is normal. The right ventricular  size is normal. Tricuspid regurgitation signal is inadequate for assessing  PA pressure.   3. The mitral valve is grossly normal. Trivial mitral valve  regurgitation. No evidence of mitral stenosis.   4. The aortic valve is tricuspid. Aortic valve regurgitation is not  visualized. No aortic stenosis is present.   5. The inferior vena cava is normal in size with greater than 50%  respiratory variability, suggesting right atrial pressure of 3 mmHg.   Patient Profile     47 y.o. female with a history of IDDM, HLD with hypertriglyceridemia with chronic pancreatitis, CAD, tobacco abuse, OSA on CPAP, and morbid obesity presented with chest pain felt atypical but with elevated troponin.  Assessment & Plan    NSTEMI - although CP felt atypical, she continued to have chest pain with HS troponin that trended to 6603 - EKG was nonischemic - she was taken to the cath lab as an urgent NSTEMI on Sat found to have 100% occlusion of her LCX with collaterals that was successfully treated with DES 3.25 x  12 mm stent and post-dilated - she had residual disease in the LAD with decision to treat medically as PCI of the proximal to mid LAD would potentially jail a moderate to large diagonal branch - could consider CABG after 3 months of DAPT if she fails medical therapy - continue ASA and brilinta x 12 months for ACS presentation   Chest pain - this morning after washing up and walking in the hall, she experienced 7/10 chest pain after getting back into bed - chest pain similar to her presentation - stat EKG is  nonischemic - received NTG SL x 2 and now starting on nitroglycerin gtt - remains on ASA and brilinta - will trend troponin - I expect this to be elevated after PCI, but should not be trending  - if continued chest pain, may need to restart heparin gtt  Case discussed with Dr. Tresa Endo who will evaluate this morning. May need to repeat LHC for re-look. Keep NPO.   Hypertension - will hold lisinopril for BP room to titrate nitroglycerin gtt - continue BB   Hyperlipidemia with LDL goal < 55 - lower goal for smoking and DM 07/23/2023: Cholesterol 180; HDL 28; LDL Cholesterol 101; Triglycerides 253; VLDL 51 LP (a) pending - 80 mg lipitor, 10 mg zetia   IDDM - A1c 6.5%, on insulin and farxiga - per primary   Current smoker Cessation recommended   OSA on CPAP - continue   Enlarged left ovary - note don CTA to rule out dissection - per primary     For questions or updates, please contact [facility] Please consult [email or web address] for contact info under  Signed, Roe Rutherford Duke, PA  07/25/2023, 9:23 AM     Patient seen and examined. Agree with assessment and plan.  I have reviewed the patient's admission, as well as her emergent cardiac catheterization.  She was found to have total occlusion of her circumflex just beyond the ostium which was successfully intervened with insertion of a 3.0 x 12 mm Synergy stent.  She also had significant  calcification of her LAD with probable 7080% stenosis as well as ostial disease involving 2 large diagonal vessels in the region of her calcified LAD stenosis.  Initially, her ECG had shown precordial T wave inversion in V2.  She developed recurrent chest pain symptomatology this morning.  Her ECG does not show any new change with complete resolution of prior ST changes in lead V2.  There also was normalization of prior J-point elevation which had been in V5 and V6.  She has been started on IV nitroglycerin with some improvement in symptomatology.  Will titrate to 30 mcg.  Current blood pressure is stable.  I would continue IV nitroglycerin for at least 24 hours and transition to isosorbide.  She is now on beta-blocker therapy in addition to DAPT.  If she develops recurrent chest pain with new ECG changes repeat catheterization may be necessary at present do not feel this needs to be done at this time.  I discussed the importance of smoking cessation.  She has a smoking history which commenced at age 39.  She is on CPAP for OSA.  Will repeat ECG in later today and again in a.m. Doppler study has shown normal EF at 60 to 65%.  Will not start today but may consider changing lisinopril to amlodipine for additional antiischemic benefit since there is no LV dysfunction   Lennette Bihari, MD, [facility] 07/25/2023 10:04 AM

## 2023-07-25 NOTE — Progress Notes (Signed)
   07/25/23 2208  BiPAP/CPAP/SIPAP  $ Non-Invasive Home Ventilator  Subsequent  BiPAP/CPAP/SIPAP Pt Type Adult  BiPAP/CPAP/SIPAP Resmed  Mask Type Full face mask  Mask Size Medium  Patient Home Equipment Yes  Safety Check Completed by RT for Home Unit Yes, no issues noted   Pt is self sufficient enough to place CPAP on herself.

## 2023-07-25 NOTE — Progress Notes (Signed)
Covering Angie Duke PA-C briefly while out. Received update from nurse that patient is CP free on IV NTG 65mcg/min with plans to titrate back down. I discussed with Dr. Tresa Endo who recommends to leave IV NTG @ 58mcg/min for today and gave OK to eat.

## 2023-07-25 NOTE — Progress Notes (Signed)
Noted CP this am. Will hold CR today and f/u tomorrow  Ethelda Chick BS, ACSM-CEP 07/25/2023 12:03 PM

## 2023-07-25 NOTE — Progress Notes (Signed)
[facility] PROGRESS NOTE    Progress Note  Renee Ho  KGM:[id number] DOB: [birth year] DOA: 07/23/2023 PCP: Melida Quitter, PA     Brief Narrative:   Renee Ho is an 47 y.o. female past medical history significant for essential hypertension hyperlipidemia, diabetes mellitus type 2 with a last A1c of 6.5, tobacco abuse sleep apnea on CPAP morbid obesity comes in complaining of chest pain that started on the day of admission like around noon substernal and exertional, accompanied by exertional shortness of breath.  In the ED twelve-lead EKG showed ST segment depressions in the anterior leads white blood cell count of 11 troponin is doubling every 4 hours CT angio of the abdomen and pelvis showed a large complex appearing left ovarian mass 7 x 3.5 x 4.5.  Cardiology was consulted she was started on IV heparin, nitro and narcotics.   Assessment/Plan:   NSTEMI (non-ST elevated myocardial infarction)/ S/P drug eluting coronary stent placement: Started on IV nitroglycerin drip and heparin. 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 2D echo done on 07/23/2023 showed an EF of 60% with regional wall motion abnormality and grade 1 diastolic dysfunction Nitroglycerin was discontinued currently chest pain-free. She will need DAPT (aspirin and Brilinta) therapy for at least a year as well as high dose statins. Repeated 2D echo pending. The patient is tentatively scheduled to discuss her case with the heart care team to see how to proceed.  Dyslipidemia/hypertriglyceridemia: High-dose statin LDL now at goal less than 55. Fasting lipid panel in 6 weeks.  Insulin-dependent diabetes mellitus type 2: With last A1c of 6.5.  Currently on long-acting insulin plus sliding scale. Currently  on Farxiga.  Tobacco abuse: Smokes about a pack per day, starting nicotine patch.  Struct of sleep apnea: Continue CPAP.  Polycythemia: Likely due to tobacco use.  Essential hypertension: Blood pressure is well-controlled ranging from 106/48 to 97/74, currently on metoprolol and lisinopril.  History of abnormal left ovary: Has a history of polycystic ovarian syndrome on CT showed an enlarged left ovarian mass 7 x 3.5 x 4.5. Pelvic ultrasound nonemergent as an outpatient.  Depression/anxiety: Continue Cymbalta.  Severe morbid obesity: With a BMI greater than 45 she has been counseled the patient reports being on Mounjaro at home.   DVT prophylaxis: DAPPT therapy Family Communication: None Status is: Inpatient Remains inpatient appropriate because: NSTEMI    Code Status:     Code Status Orders  (From admission, onward)           Start     Ordered   07/23/23 1737  Full code  Continuous       Question:  By:  Answer:  Consent: discussion documented in EHR   07/23/23 1736           Code Status History     Date Active Date Inactive Code Status Order ID Comments User Context   07/23/2023 0944 07/23/2023 1736 Full Code [id number]  Clydie Braun, MD ED   06/19/2022 1034 06/23/2022 1850 Full Code [id number]  Bobette Mo, MD Inpatient         IV Access:   Peripheral IV   Procedures and diagnostic studies:   CARDIAC CATHETERIZATION  Result Date: 07/23/2023   Prox Cx to Mid Cx lesion is 100% stenosed.   Mid LAD lesion is  50% stenosed.   Prox LAD to Mid LAD lesion is 70% stenosed.   1st Diag lesion is 50% stenosed.   2nd Diag lesion is 90% stenosed.   Mid LAD to Dist LAD lesion is 50% stenosed.   Mid RCA lesion is 60% stenosed.   Dist RCA lesion is 40% stenosed.   A drug-eluting stent was successfully placed using a SYNERGY XD 3.0X12.   Post intervention, there is a 0% residual stenosis. NSTEMI secondary to thrombotic occlusion of the proximal  Circumflex artery. Collateral filling of the distal Circumflex and OM branches from right to left collaterals. Successful PTCA/DES x 1 proximal Circumflex Severe stenosis in the heavily calcified mid LAD involving two diagonal branches Moderate non-obstructive disease in the large dominant RCA LVEDP 11 mmHg Recommendations: Admit to the ICU. Aggrastat drip for 2 hours. DAPT with ASA and Brilinta for one year. High intensity statin. Echo tomorrow. Will review the lesions in the LAD with the IC team. I would favor medical management of her LAD disease for now. PCI of the proximal to mid LAD would potentially jeopardize flow into the Diagonal branches which are moderate to large in caliber. Could consider bypass after 3 months of DAPT.   ECHOCARDIOGRAM COMPLETE  Result Date: 07/23/2023    ECHOCARDIOGRAM REPORT   Patient Name:   Renee Ho Date of Exam: 07/23/2023 Medical Rec #:  [id number]                  Height:       65.0 in Accession #:    [id number]                 Weight:       260.0 lb Date of Birth:  20-Jun-1976                   BSA:          2.211 m Patient Age:    47 years                   BP:           140/70 mmHg Patient Gender: F                          HR:           66 bpm. Exam Location:  Inpatient Procedure: 2D Echo, Color Doppler, Cardiac Doppler and Intracardiac            Opacification Agent Indications:    Acute myocardial infarction  History:        Patient has no prior history of Echocardiogram examinations.                 Risk Factors:Diabetes, Dyslipidemia, Hypertension, Former Smoker                 and Obstructive Sleep Apnea.  Sonographer:    Raeford Razor RDCS Referring Phys: [id number] [facility]  Sonographer Comments: Technically difficult study due to poor echo windows, suboptimal apical window and patient is obese. Image acquisition challenging due to patient body habitus. IMPRESSIONS  1. Left ventricular ejection fraction, by estimation, is 60 to 65%. The left  ventricle has normal function. The left ventricle demonstrates regional wall motion abnormalities (see scoring diagram/findings for description). There is moderate asymmetric left ventricular hypertrophy of the septal segment. Left ventricular diastolic parameters are consistent with Grade I diastolic dysfunction (impaired relaxation).  [facility] PROGRESS NOTE    Progress Note  Renee Ho  KGM:[id number] DOB: [birth year] DOA: 07/23/2023 PCP: Melida Quitter, PA     Brief Narrative:   Renee Ho is an 47 y.o. female past medical history significant for essential hypertension hyperlipidemia, diabetes mellitus type 2 with a last A1c of 6.5, tobacco abuse sleep apnea on CPAP morbid obesity comes in complaining of chest pain that started on the day of admission like around noon substernal and exertional, accompanied by exertional shortness of breath.  In the ED twelve-lead EKG showed ST segment depressions in the anterior leads white blood cell count of 11 troponin is doubling every 4 hours CT angio of the abdomen and pelvis showed a large complex appearing left ovarian mass 7 x 3.5 x 4.5.  Cardiology was consulted she was started on IV heparin, nitro and narcotics.   Assessment/Plan:   NSTEMI (non-ST elevated myocardial infarction)/ S/P drug eluting coronary stent placement: Started on IV nitroglycerin drip and heparin. 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 2D echo done on 07/23/2023 showed an EF of 60% with regional wall motion abnormality and grade 1 diastolic dysfunction Nitroglycerin was discontinued currently chest pain-free. She will need DAPT (aspirin and Brilinta) therapy for at least a year as well as high dose statins. Repeated 2D echo pending. The patient is tentatively scheduled to discuss her case with the heart care team to see how to proceed.  Dyslipidemia/hypertriglyceridemia: High-dose statin LDL now at goal less than 55. Fasting lipid panel in 6 weeks.  Insulin-dependent diabetes mellitus type 2: With last A1c of 6.5.  Currently on long-acting insulin plus sliding scale. Currently  on Farxiga.  Tobacco abuse: Smokes about a pack per day, starting nicotine patch.  Struct of sleep apnea: Continue CPAP.  Polycythemia: Likely due to tobacco use.  Essential hypertension: Blood pressure is well-controlled ranging from 106/48 to 97/74, currently on metoprolol and lisinopril.  History of abnormal left ovary: Has a history of polycystic ovarian syndrome on CT showed an enlarged left ovarian mass 7 x 3.5 x 4.5. Pelvic ultrasound nonemergent as an outpatient.  Depression/anxiety: Continue Cymbalta.  Severe morbid obesity: With a BMI greater than 45 she has been counseled the patient reports being on Mounjaro at home.   DVT prophylaxis: DAPPT therapy Family Communication: None Status is: Inpatient Remains inpatient appropriate because: NSTEMI    Code Status:     Code Status Orders  (From admission, onward)           Start     Ordered   07/23/23 1737  Full code  Continuous       Question:  By:  Answer:  Consent: discussion documented in EHR   07/23/23 1736           Code Status History     Date Active Date Inactive Code Status Order ID Comments User Context   07/23/2023 0944 07/23/2023 1736 Full Code [id number]  Clydie Braun, MD ED   06/19/2022 1034 06/23/2022 1850 Full Code [id number]  Bobette Mo, MD Inpatient         IV Access:   Peripheral IV   Procedures and diagnostic studies:   CARDIAC CATHETERIZATION  Result Date: 07/23/2023   Prox Cx to Mid Cx lesion is 100% stenosed.   Mid LAD lesion is  50% stenosed.   Prox LAD to Mid LAD lesion is 70% stenosed.   1st Diag lesion is 50% stenosed.   2nd Diag lesion is 90% stenosed.   Mid LAD to Dist LAD lesion is 50% stenosed.   Mid RCA lesion is 60% stenosed.   Dist RCA lesion is 40% stenosed.   A drug-eluting stent was successfully placed using a SYNERGY XD 3.0X12.   Post intervention, there is a 0% residual stenosis. NSTEMI secondary to thrombotic occlusion of the proximal  Circumflex artery. Collateral filling of the distal Circumflex and OM branches from right to left collaterals. Successful PTCA/DES x 1 proximal Circumflex Severe stenosis in the heavily calcified mid LAD involving two diagonal branches Moderate non-obstructive disease in the large dominant RCA LVEDP 11 mmHg Recommendations: Admit to the ICU. Aggrastat drip for 2 hours. DAPT with ASA and Brilinta for one year. High intensity statin. Echo tomorrow. Will review the lesions in the LAD with the IC team. I would favor medical management of her LAD disease for now. PCI of the proximal to mid LAD would potentially jeopardize flow into the Diagonal branches which are moderate to large in caliber. Could consider bypass after 3 months of DAPT.   ECHOCARDIOGRAM COMPLETE  Result Date: 07/23/2023    ECHOCARDIOGRAM REPORT   Patient Name:   Renee Ho Date of Exam: 07/23/2023 Medical Rec #:  [id number]                  Height:       65.0 in Accession #:    [id number]                 Weight:       260.0 lb Date of Birth:  20-Jun-1976                   BSA:          2.211 m Patient Age:    47 years                   BP:           140/70 mmHg Patient Gender: F                          HR:           66 bpm. Exam Location:  Inpatient Procedure: 2D Echo, Color Doppler, Cardiac Doppler and Intracardiac            Opacification Agent Indications:    Acute myocardial infarction  History:        Patient has no prior history of Echocardiogram examinations.                 Risk Factors:Diabetes, Dyslipidemia, Hypertension, Former Smoker                 and Obstructive Sleep Apnea.  Sonographer:    Raeford Razor RDCS Referring Phys: [id number] [facility]  Sonographer Comments: Technically difficult study due to poor echo windows, suboptimal apical window and patient is obese. Image acquisition challenging due to patient body habitus. IMPRESSIONS  1. Left ventricular ejection fraction, by estimation, is 60 to 65%. The left  ventricle has normal function. The left ventricle demonstrates regional wall motion abnormalities (see scoring diagram/findings for description). There is moderate asymmetric left ventricular hypertrophy of the septal segment. Left ventricular diastolic parameters are consistent with Grade I diastolic dysfunction (impaired relaxation).  [facility] PROGRESS NOTE    Progress Note  Renee Ho  KGM:[id number] DOB: [birth year] DOA: 07/23/2023 PCP: Melida Quitter, PA     Brief Narrative:   Renee Ho is an 47 y.o. female past medical history significant for essential hypertension hyperlipidemia, diabetes mellitus type 2 with a last A1c of 6.5, tobacco abuse sleep apnea on CPAP morbid obesity comes in complaining of chest pain that started on the day of admission like around noon substernal and exertional, accompanied by exertional shortness of breath.  In the ED twelve-lead EKG showed ST segment depressions in the anterior leads white blood cell count of 11 troponin is doubling every 4 hours CT angio of the abdomen and pelvis showed a large complex appearing left ovarian mass 7 x 3.5 x 4.5.  Cardiology was consulted she was started on IV heparin, nitro and narcotics.   Assessment/Plan:   NSTEMI (non-ST elevated myocardial infarction)/ S/P drug eluting coronary stent placement: Started on IV nitroglycerin drip and heparin. 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 2D echo done on 07/23/2023 showed an EF of 60% with regional wall motion abnormality and grade 1 diastolic dysfunction Nitroglycerin was discontinued currently chest pain-free. She will need DAPT (aspirin and Brilinta) therapy for at least a year as well as high dose statins. Repeated 2D echo pending. The patient is tentatively scheduled to discuss her case with the heart care team to see how to proceed.  Dyslipidemia/hypertriglyceridemia: High-dose statin LDL now at goal less than 55. Fasting lipid panel in 6 weeks.  Insulin-dependent diabetes mellitus type 2: With last A1c of 6.5.  Currently on long-acting insulin plus sliding scale. Currently  on Farxiga.  Tobacco abuse: Smokes about a pack per day, starting nicotine patch.  Struct of sleep apnea: Continue CPAP.  Polycythemia: Likely due to tobacco use.  Essential hypertension: Blood pressure is well-controlled ranging from 106/48 to 97/74, currently on metoprolol and lisinopril.  History of abnormal left ovary: Has a history of polycystic ovarian syndrome on CT showed an enlarged left ovarian mass 7 x 3.5 x 4.5. Pelvic ultrasound nonemergent as an outpatient.  Depression/anxiety: Continue Cymbalta.  Severe morbid obesity: With a BMI greater than 45 she has been counseled the patient reports being on Mounjaro at home.   DVT prophylaxis: DAPPT therapy Family Communication: None Status is: Inpatient Remains inpatient appropriate because: NSTEMI    Code Status:     Code Status Orders  (From admission, onward)           Start     Ordered   07/23/23 1737  Full code  Continuous       Question:  By:  Answer:  Consent: discussion documented in EHR   07/23/23 1736           Code Status History     Date Active Date Inactive Code Status Order ID Comments User Context   07/23/2023 0944 07/23/2023 1736 Full Code [id number]  Clydie Braun, MD ED   06/19/2022 1034 06/23/2022 1850 Full Code [id number]  Bobette Mo, MD Inpatient         IV Access:   Peripheral IV   Procedures and diagnostic studies:   CARDIAC CATHETERIZATION  Result Date: 07/23/2023   Prox Cx to Mid Cx lesion is 100% stenosed.   Mid LAD lesion is

## 2023-07-25 NOTE — TOC CM/SW Note (Signed)
Transition of Care [facility]) - Inpatient Brief Assessment   Patient Details  Name: Renee Ho MRN: [id number] Date of Birth: [birth year]  [facility] CM/SW Contact:    Gala Lewandowsky, RN Phone Number: 07/25/2023, 4:14 PM   Clinical Narrative: Patient presented for chest pain-Nstemi. Post cath 07-23-23. Case Manager will continue to follow for transition of care needs as the patient progresses.    Transition of Care Asessment: Insurance and Status: Insurance coverage has been reviewed Prior/Current Home Services: No current home services Social Determinants of Health Reivew: SDOH reviewed no interventions necessary Readmission risk has been reviewed: Yes Transition of care needs: no transition of care needs at this time

## 2023-07-25 NOTE — TOC Benefit Eligibility Note (Signed)
Patient Product/process development scientist completed.    The patient is insured through [facility] [employer]. Patient has [employer], may use a copay card, and/or apply for patient assistance if available.    Ran test claim for Brilinta 90 mg and the current 30 day co-pay is $83.68.   This test claim was processed through [facility]- copay amounts may vary at other pharmacies due to pharmacy/plan contracts, or as the patient moves through the different stages of their insurance plan.     Roland Earl, CPHT Pharmacy Technician III Certified Patient Advocate [facility] Patient Advocate Team Direct Number: [phone]  Fax: [phone]

## 2023-07-26 DIAGNOSIS — Z6841 Body Mass Index (BMI) 40.0 and over, adult: Secondary | ICD-10-CM

## 2023-07-26 DIAGNOSIS — E66813 Obesity, class 3: Secondary | ICD-10-CM

## 2023-07-26 DIAGNOSIS — I249 Acute ischemic heart disease, unspecified: Secondary | ICD-10-CM

## 2023-07-26 DIAGNOSIS — E782 Mixed hyperlipidemia: Secondary | ICD-10-CM | POA: Diagnosis not present

## 2023-07-26 DIAGNOSIS — I214 Non-ST elevation (NSTEMI) myocardial infarction: Secondary | ICD-10-CM | POA: Diagnosis not present

## 2023-07-26 DIAGNOSIS — F17210 Nicotine dependence, cigarettes, uncomplicated: Secondary | ICD-10-CM

## 2023-07-26 LAB — GLUCOSE, CAPILLARY
Glucose-Capillary: 115 mg/dL — ABNORMAL HIGH (ref 70–99)
Glucose-Capillary: 131 mg/dL — ABNORMAL HIGH (ref 70–99)
Glucose-Capillary: 116 mg/dL — ABNORMAL HIGH (ref 70–99)
Glucose-Capillary: 135 mg/dL — ABNORMAL HIGH (ref 70–99)

## 2023-07-26 LAB — BASIC METABOLIC PANEL
Anion gap: 8 (ref 5–15)
BUN: 16 mg/dL (ref 6–20)
CO2: 27 mmol/L (ref 22–32)
Calcium: 9 mg/dL (ref 8.9–10.3)
Chloride: 104 mmol/L (ref 98–111)
Creatinine, Ser: 0.9 mg/dL (ref 0.44–1.00)
GFR, Estimated: >60 mL/min (ref >60)
Glucose, Bld: 108 mg/dL — ABNORMAL HIGH (ref 70–99)
Potassium: 4.6 mmol/L (ref 3.5–5.1)
Sodium: 139 mmol/L (ref 135–145)

## 2023-07-26 LAB — CBC
HCT: 44.6 % (ref 36.0–46.0)
Hemoglobin: 14.9 g/dL (ref 12.0–15.0)
MCH: 31 pg (ref 26.0–34.0)
MCHC: 33.4 g/dL (ref 30.0–36.0)
MCV: 92.7 fL (ref 80.0–100.0)
Platelets: 153 10*3/uL (ref 150–400)
RBC: 4.81 MIL/uL (ref 3.87–5.11)
RDW: 13.4 % (ref 11.5–15.5)
WBC: 10.5 10*3/uL (ref 4.0–10.5)
nRBC: 0 % (ref 0.0–0.2)

## 2023-07-26 MED ORDER — ISOSORBIDE MONONITRATE ER 30 MG PO TB24
30.0000 mg | ORAL_TABLET | Freq: Every day | ORAL | Status: DC
  Administered 2023-07-26 – 2023-07-27 (×2): 30 mg via ORAL
  Filled 2023-07-26 (×2): qty 1

## 2023-07-26 NOTE — Progress Notes (Addendum)
Patient Name: Renee Ho Date of Encounter: 07/26/2023 [facility] Cardiologist: Tessa Lerner, DO   Interval Summary  .    No further episodes of chest pain. Remains on IV NTG.   Vital Signs .    Vitals:   07/25/23 1730 07/25/23 1950 07/25/23 2333 07/26/23 0434  BP: 120/62 123/71 119/73 123/71  Pulse:  69 (!) 59 72  Resp:  20 19 16   Temp:  98.4 F (36.9 C) 97.7 F (36.5 C) 97.6 F (36.4 C)  TempSrc:  Oral Axillary Oral  SpO2:  97% 94% 97%  Weight:      Height:        Intake/Output Summary (Last 24 hours) at 07/26/2023 0758 Last data filed at 07/26/2023 0434 Gross per 24 hour  Intake 281.11 ml  Output --  Net 281.11 ml      07/23/2023    5:43 PM 07/23/2023    2:58 AM 06/30/2023    1:01 AM  Last 3 Weights  Weight (lbs) 255 lb 11.7 oz 260 lb 260 lb  Weight (kg) 116 kg 117.935 kg 117.935 kg      Telemetry/ECG    Sinu Bradycardia - Personally Reviewed  Physical Exam .   GEN: No acute distress.   Neck: No JVD Cardiac: RRR, no murmurs, rubs, or gallops.  Respiratory: Clear to auscultation bilaterally. GI: Soft, nontender, non-distended  MS: No edema  Assessment & Plan .     47 y.o. female with a history of IDDM, HLD with hypertriglyceridemia with chronic pancreatitis, CAD, tobacco abuse, OSA on CPAP, and morbid obesity presented with chest pain felt atypical but with elevated troponin.   NSTEMI Chest pain -- although CP felt atypical, she continued to have chest pain with HS troponin that trended to 6603. EKG was nonischemic -- underwent cardiac cath as urgent NSTEMI on Sat found to have 100% occlusion of her LCX with collaterals that was successfully treated with DES 3.25 x 12 mm stent and post-dilated. Did have residual disease in the LAD with decision to treat medically as PCI of the proximal to mid LAD would potentially jail a moderate to large diagonal branch -- could consider CABG after 3 months of DAPT if she fails medical  therapy -- episode if chest pain yesterday morning, EKG non-ischemic. hsTn cycled 3412>>4022 -- treated with IV nitroglycerin, no further episodes of chest pain. Plan to wean and transition to Imdur  -- continue DAPT with ASA and brilinta x 12 months for ACS presentation  Hypertension -- continue lisinopril and BB   Hyperlipidemia with LDL goal < 55 -- lower goal for smoking and DM -- 07/23/2023: Cholesterol 180; HDL 28; LDL Cholesterol 101; Triglycerides 253; VLDL 51 -- LP (a) pending -- continue 80 mg lipitor, 10 mg zetia   IDDM -- A1c 6.5%, on insulin and farxiga -- per primary   Current smoker -- Cessation recommended   OSA on CPAP -- continue   Enlarged left ovary -- noted on CTA to rule out dissection -- per primary     For questions or updates, please contact [facility] Please consult [email or web address] for contact info under        Signed, Laverda Page, NP    Patient seen and examined. Agree with assessment and plan.  Feels much better today.  No recurrent chest pain.  She has been weaned off IV nitro and plan is to initiate isosorbide.  Will monitor blood pressure and heart rate today.  If she does  experience recurrent chest pain symptomatology, she may also benefit from addition of amlodipine to her medical regimen.  If blood pressure is an issue, alternative treatment can be ranolazine.  Lipid panel is consistent with mixed hyperlipidemia.  She is now on atorvastatin 80 mg and Zetia 10 mg.  With triglyceride elevation at 253 will add Vascepa 2 capsules twice a day.  I again discussed absolute tobacco cessation and she states that she will not resumed tobacco use.  Keep in hospital today with plan for hospital discharge tomorrow.   Lennette Bihari, MD, [facility] 07/26/2023 11:00 AM

## 2023-07-26 NOTE — Progress Notes (Addendum)
Pt in bed, denies chest pain. Attempted to initiate education however pt c/p of tiredness and asked if I could return later. Will return to educate and hopefully ambulate in hallway.   F/U at 1220  CARDIAC REHAB PHASE I   PRE:  Rate/Rhythm: 66 NSR  BP:  Sitting: 113/62      SaO2: 96 RA  MODE:  Ambulation: 470 ft   AD:   no AD  POST:  Rate/Rhythm: 72 NSR  BP:  Sitting: 127/68      SaO2: 96 RA  Pt ambulated with supervision assist in the hallway, she denies chest pain and dyspnea during ambulation as was returned to room w/o c/p.   Pt was educated on stent card, stent location, Antiplatelet and ASA use, wt restrictions, no baths/daily wash-ups, s/s of infection, ex guidelines, s/s to stop exercising, NTG use and calling 911, heart healthy and diabetic diet, risk factors, smoking cessation, and CRPII. Pt received MI book and materials on exercise, diet, and CRPII. Will refer to [facility].   Faustino Congress  ACSM-CEP 1:06 PM 07/26/2023    Service time is from 1220 to 1306.

## 2023-07-26 NOTE — Progress Notes (Signed)
[facility] PROGRESS NOTE    Progress Note  Renee Ho  QIH:[id number] DOB: 29-Jun-1976 DOA: 07/23/2023 PCP: Melida Quitter, PA     Brief Narrative:   Renee Ho is an 47 y.o. female past medical history significant for essential hypertension hyperlipidemia, diabetes mellitus type 2 with a last A1c of 6.5, tobacco abuse sleep apnea on CPAP morbid obesity comes in complaining of chest pain that started on the day of admission like around noon substernal and exertional, accompanied by exertional shortness of breath.  In the ED twelve-lead EKG showed ST segment depressions in the anterior leads white blood cell count of 11 troponin is doubling every 4 hours CT angio of the abdomen and pelvis showed a large complex appearing left ovarian mass 7 x 3.5 x 4.5.  Cardiology was consulted she was started on IV heparin, nitro and narcotics.   Assessment/Plan:   NSTEMI (non-ST elevated myocardial infarction)/ S/P drug eluting coronary stent placement: Started on IV nitroglycerin drip and heparin. 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 2D echo done on 07/23/2023 showed an EF of 60% with regional wall motion abnormality and grade 1 diastolic dysfunction Had chest pain overnight had to be placed on IV nitroglycerin, now transitioning to oral Imdur.  Will reevaluate in the morning if she continues to have chest pain she will have to be re-evaluated by cardiology. She will need DAPT (aspirin and Brilinta) therapy for at least a year as well as high dose statins. The plan is to consider CABG if she fails dual antiplatelet therapy in 3 months.  Dyslipidemia/hypertriglyceridemia: High-dose statin LDL now at goal less than 55. Fasting lipid panel in 6 weeks.  Insulin-dependent diabetes  mellitus type 2: With last A1c of 6.5.  Currently on long-acting insulin plus sliding scale. Currently on Farxiga.  Tobacco abuse: Smokes about a pack per day, starting nicotine patch.  Struct of sleep apnea: Continue CPAP.  Polycythemia: Likely due to tobacco use.  Essential hypertension: Blood pressure is well-controlled ranging from 106/48 to 97/74, currently on metoprolol and lisinopril.  History of abnormal left ovary: Has a history of polycystic ovarian syndrome on CT showed an enlarged left ovarian mass 7 x 3.5 x 4.5. Pelvic ultrasound nonemergent as an outpatient.  Depression/anxiety: Continue Cymbalta.  Severe morbid obesity: With a BMI greater than 45 she has been counseled the patient reports being on Mounjaro at home.   DVT prophylaxis: DAPPT therapy Family Communication: None Status is: Inpatient Remains inpatient appropriate because: NSTEMI    Code Status:     Code Status Orders  (From admission, onward)           Start     Ordered   07/23/23 1737  Full code  Continuous       Question:  By:  Answer:  Consent: discussion documented in EHR   07/23/23 1736           Code Status History     Date Active Date Inactive Code Status Order ID Comments User Context   07/23/2023 0944 07/23/2023 1736 Full Code [id number]  Clydie Braun, MD ED   06/19/2022 1034 06/23/2022 1850 Full Code [id number]  Bobette Mo, MD Inpatient         IV Access:   Peripheral IV   Procedures and diagnostic studies:  No results found.   Medical Consultants:   None.   Subjective:    Renee Ho Ho had chest pain overnight that resolved with nitroglycerin  Objective:    Vitals:   07/25/23 1950 07/25/23 2333 07/26/23 0434 07/26/23 0920  BP: 123/71 119/73 123/71 104/64  Pulse: 69 (!) 59 72 63  Resp: 20 19 16    Temp: 98.4 F (36.9 C) 97.7 F (36.5 C) 97.6 F (36.4 C)   TempSrc: Oral Axillary Oral   SpO2: 97% 94% 97%   Weight:       Height:       SpO2: 97 % O2 Flow Rate (L/min): 2 L/min   Intake/Output Summary (Last 24 hours) at 07/26/2023 1059 Last data filed at 07/26/2023 0434 Gross per 24 hour  Intake 281.11 ml  Output --  Net 281.11 ml   Filed Weights   07/23/23 0258 07/23/23 1743  Weight: 117.9 kg 116 kg    Exam: General exam: In no acute distress. Respiratory system: Good air movement and clear to auscultation. Cardiovascular system: S1 & S2 heard, RRR. No JVD. Gastrointestinal system: Abdomen is nondistended, soft and nontender.  Extremities: No pedal edema. Skin: No rashes, lesions or ulcers Psychiatry: Judgement and insight appear normal. Mood & affect appropriate.  Data Reviewed:    Labs: Basic Metabolic Panel: Recent Labs  Lab 07/23/23 0310 07/24/23 0340 07/25/23 0459 07/26/23 0813  NA 137 136 139 139  K 3.7 3.9 3.9 4.6  CL 97* 103 105 104  CO2 24 23 25 27   GLUCOSE 254* 139* 118* 108*  BUN 20 17 18 16   CREATININE 0.93 0.83 0.82 0.90  CALCIUM 9.5 9.0 9.1 9.0   GFR Estimated Creatinine Clearance: 96.2 mL/min (by C-G formula based on SCr of 0.9 mg/dL). Liver Function Tests: Recent Labs  Lab 07/23/23 0310  AST 21  ALT 12  ALKPHOS 50  BILITOT 1.1  PROT 6.5  ALBUMIN 3.6   Recent Labs  Lab 07/23/23 0310  LIPASE 40   No results for input(s): "AMMONIA" in the last 168 hours. Coagulation profile No results for input(s): "INR", "PROTIME" in the last 168 hours. COVID-19 Labs  No results for input(s): "DDIMER", "FERRITIN", "LDH", "CRP" in the last 72 hours.   No results found for: "SARSCOV2NAA"  CBC: Recent Labs  Lab 07/23/23 0310 07/24/23 0340 07/25/23 0459 07/26/23 0542  WBC 10.7* 10.9* 8.8 10.5  NEUTROABS 6.6  --   --   --   HGB 16.0* 16.0* 16.1* 14.9  HCT 46.6* 47.5* 48.2* 44.6  MCV 89.3 91.7 91.6 92.7  PLT 175 166 164 153   Cardiac Enzymes: No results for input(s): "CKTOTAL", "CKMB", "CKMBINDEX", "TROPONINI" in the last 168 hours. BNP (last 3  results) No results for input(s): "PROBNP" in the last 8760 hours. CBG: Recent Labs  Lab 07/25/23 0812 07/25/23 1134 07/25/23 1610 07/25/23 2054 07/26/23 0754  GLUCAP 118* 108* 119* 134* 115*   D-Dimer: No results for input(s): "DDIMER" in the last 72 hours. Hgb A1c: Recent Labs    07/23/23 1345  HGBA1C 6.5*   Lipid Profile: Recent Labs    07/23/23 1832  LDLDIRECT 123*   Thyroid function studies: No results for input(s): "TSH", "T4TOTAL", "T3FREE", "THYROIDAB" in the last 72 hours.  Invalid input(s): "FREET3" Anemia work up: No results for input(s): "VITAMINB12", "FOLATE", "FERRITIN", "TIBC", "IRON", "RETICCTPCT" in the last 72 hours. Sepsis Labs: Recent Labs  Lab 07/23/23 0310 07/24/23 0340 07/25/23 0459 07/26/23 0542  WBC 10.7* 10.9* 8.8 10.5  Microbiology Recent Results (from the past 240 hour(s))  MRSA Next Gen by PCR, Nasal     Status: None   Collection Time: 07/23/23  5:35 PM   Specimen: Nasal Mucosa; Nasal Swab  Result Value Ref Range Status   MRSA by PCR Next Gen NOT DETECTED NOT DETECTED Final    Comment: (NOTE) The GeneXpert MRSA Assay (FDA approved for NASAL specimens only), is one component of a comprehensive MRSA colonization surveillance program. It is not intended to diagnose MRSA infection nor to guide or monitor treatment for MRSA infections. Test performance is not FDA approved in patients less than 13 years old. Performed at [facility], [address], [place], [place] [address]      Medications:    aspirin EC  81 mg Oral Daily   atorvastatin  80 mg Oral Daily   dapagliflozin propanediol  10 mg Oral Daily   DULoxetine  90 mg Oral Daily   enoxaparin (LOVENOX) injection  60 mg Subcutaneous Q24H   ezetimibe  10 mg Oral Daily   fenofibrate  160 mg Oral Daily    HYDROmorphone (DILAUDID) injection  1 mg Intravenous Once   insulin aspart  0-20 Units Subcutaneous TID WC   insulin glargine-yfgn  50 Units Subcutaneous QHS    isosorbide mononitrate  30 mg Oral Daily   lisinopril  5 mg Oral Daily   melatonin  3 mg Oral QHS   metoprolol tartrate  25 mg Oral BID   nicotine  21 mg Transdermal Daily   sodium chloride flush  3 mL Intravenous Q12H   ticagrelor  90 mg Oral BID   Continuous Infusions:  nitroGLYCERIN Stopped (07/26/23 1000)   promethazine (PHENERGAN) injection (IM or IVPB) Stopped (07/23/23 0956)      LOS: 3 days   Marinda Elk  [facility]  07/26/2023, 10:59 AM

## 2023-07-27 ENCOUNTER — Telehealth: Payer: Self-pay | Admitting: Cardiology

## 2023-07-27 ENCOUNTER — Other Ambulatory Visit (HOSPITAL_COMMUNITY): Payer: Self-pay

## 2023-07-27 DIAGNOSIS — I214 Non-ST elevation (NSTEMI) myocardial infarction: Secondary | ICD-10-CM | POA: Diagnosis not present

## 2023-07-27 LAB — BASIC METABOLIC PANEL
Anion gap: 12 (ref 5–15)
BUN: 17 mg/dL (ref 6–20)
CO2: 23 mmol/L (ref 22–32)
Calcium: 9 mg/dL (ref 8.9–10.3)
Chloride: 103 mmol/L (ref 98–111)
Creatinine, Ser: 0.85 mg/dL (ref 0.44–1.00)
GFR, Estimated: >60 mL/min (ref >60)
Glucose, Bld: 100 mg/dL — ABNORMAL HIGH (ref 70–99)
Potassium: 4.1 mmol/L (ref 3.5–5.1)
Sodium: 138 mmol/L (ref 135–145)

## 2023-07-27 LAB — GLUCOSE, CAPILLARY: Glucose-Capillary: 104 mg/dL — ABNORMAL HIGH (ref 70–99)

## 2023-07-27 LAB — LIPOPROTEIN A (LPA): Lipoprotein (a): 30.2 nmol/L — ABNORMAL HIGH (ref <75.0)

## 2023-07-27 MED ORDER — METOPROLOL TARTRATE 25 MG PO TABS
25.0000 mg | ORAL_TABLET | Freq: Two times a day (BID) | ORAL | 0 refills | Status: DC
  Filled 2023-07-27: qty 60, 30d supply, fill #0

## 2023-07-27 MED ORDER — NICOTINE 21 MG/24HR TD PT24
21.0000 mg | MEDICATED_PATCH | Freq: Every day | TRANSDERMAL | 0 refills | Status: DC
  Filled 2023-07-27: qty 28, 28d supply, fill #0

## 2023-07-27 MED ORDER — FENOFIBRATE 160 MG PO TABS
160.0000 mg | ORAL_TABLET | Freq: Every day | ORAL | 0 refills | Status: DC
  Filled 2023-07-27: qty 30, 30d supply, fill #0

## 2023-07-27 MED ORDER — EZETIMIBE 10 MG PO TABS
10.0000 mg | ORAL_TABLET | Freq: Every day | ORAL | 0 refills | Status: DC
  Filled 2023-07-27: qty 30, 30d supply, fill #0

## 2023-07-27 MED ORDER — ASPIRIN 81 MG PO TBEC
81.0000 mg | DELAYED_RELEASE_TABLET | Freq: Every day | ORAL | 0 refills | Status: DC
  Filled 2023-07-27: qty 30, 30d supply, fill #0

## 2023-07-27 MED ORDER — TICAGRELOR 90 MG PO TABS
90.0000 mg | ORAL_TABLET | Freq: Two times a day (BID) | ORAL | 0 refills | Status: DC
  Filled 2023-07-27: qty 60, 30d supply, fill #0

## 2023-07-27 MED ORDER — ATORVASTATIN CALCIUM 80 MG PO TABS
80.0000 mg | ORAL_TABLET | Freq: Every day | ORAL | 0 refills | Status: DC
  Filled 2023-07-27: qty 30, 30d supply, fill #0

## 2023-07-27 MED ORDER — INSULIN GLARGINE-YFGN 100 UNIT/ML ~~LOC~~ SOPN: 60.0000 [IU] | PEN_INJECTOR | Freq: Every day | SUBCUTANEOUS | Status: DC

## 2023-07-27 MED ORDER — ISOSORBIDE MONONITRATE ER 30 MG PO TB24
30.0000 mg | ORAL_TABLET | Freq: Every day | ORAL | 0 refills | Status: DC
  Filled 2023-07-27: qty 30, 30d supply, fill #0

## 2023-07-27 NOTE — Discharge Summary (Signed)
Physician Discharge Summary  Renee Ho ZOX:[id number] DOB: 1975/11/29 DOA: 07/23/2023  PCP: Melida Quitter, PA  Admit date: 07/23/2023 Discharge date: 07/27/2023  Time spent: 35 minutes  Recommendations for Outpatient Follow-up:  Left ovary complex cystic lesion, recommend pelvic ultrasound and GYN follow-up PCP in 1 week Cardiology Dr.Tolia in 1 month Encourage weight loss and lifestyle modification   Discharge Diagnoses:  Principal Problem:   NSTEMI (non-ST elevated myocardial infarction) (HCC) Active Problems:   Leukocytosis   Dyslipidemia   Hypertriglyceridemia   Essential hypertension   Type 2 diabetes mellitus treated with insulin (HCC)   Polycythemia   POLYCYSTIC OVARIAN DISEASE   Depression   Tobacco abuse   Sleep apnea   Morbid obesity (HCC)   Pure hypertriglyceridemia   Mixed hyperlipidemia   Class 3 severe obesity due to excess calories with serious comorbidity and body mass index (BMI) of 40.0 to 44.9 in adult [facility])   Acute coronary syndrome (HCC)   Coronary atherosclerosis due to calcified coronary lesion   Atherosclerosis of aorta (HCC)   Cigarette smoker   S/P drug eluting coronary stent placement   Coronary artery disease involving native coronary artery of native heart without angina pectoris   Discharge Condition: Improved  Diet recommendation: Hepatic, low-sodium, heart healthy  Filed Weights   07/23/23 0258 07/23/23 1743 07/27/23 0429  Weight: 117.9 kg 116 kg 115 kg    History of present illness:  47 y.o. female with a history of IDDM, HLD with hypertriglyceridemia with chronic pancreatitis, CAD, tobacco abuse, OSA on CPAP, and morbid obesity presented with chest pain felt atypical but with elevated troponin.   Hospital Course:   NSTEMI Chest pain -- Presented with atypical chest pain, at bedtime troponin trended up to 03/16/2002 -- Cards consulting, underwent left heart cath  found to have 100% occlusion of her LCX with  collaterals that was successfully treated with DES 3.25 x 12 mm stent and post-dilated. Did have residual disease in the LAD with decision to treat medically as PCI of the proximal to mid LAD would potentially jail a moderate to large diagonal branch -Per cardiology consider CABG after 3 months of DAPT if she fails medical therapy -Also started on Imdur, continue aspirin, Brilinta for 12 months, metoprolol and statin   Hypertension -- continue lisinopril and metoprolol   Hyperlipidemia with LDL goal < 55 -- Cards increase Lipitor dose and started on Zetia   IDDM -- A1c 6.5%, on insulin and farxiga -- Continue long-acting insulin, dose decreased   Current smoker -- Counseled   Obesity OSA on CPAP -- continued -Needs weight loss, diet and lifestyle modification   Enlarged left ovary --, History of polycystic ovarian disease, could be related to this however recommend pelvic ultrasound as outpatient follow-up with GYN    Procedures: Left heart cath  Consultations: Cardiology Discharge Exam: Vitals:   07/26/23 1938 07/27/23 0429  BP: (!) 103/59 116/71  Pulse: 64 (!) 51  Resp: 19 17  Temp: 98.8 F (37.1 C) 98.1 F (36.7 C)  SpO2: 94% 92%  Gen: Obese pleasant female awake, Alert, Oriented X 3,  HEENT: no JVD Lungs: Good air movement bilaterally, CTAB CVS: S1S2/RRR Abd: soft, Non tender, non distended, BS present Extremities: No edema Skin: no new rashes on exposed skin   Discharge Instructions   Discharge Instructions     Amb Referral to Cardiac Rehabilitation   Complete by: As directed    Diagnosis:  Coronary Stents NSTEMI     After initial  Physician Discharge Summary  Renee Ho ZOX:[id number] DOB: 1975/11/29 DOA: 07/23/2023  PCP: Melida Quitter, PA  Admit date: 07/23/2023 Discharge date: 07/27/2023  Time spent: 35 minutes  Recommendations for Outpatient Follow-up:  Left ovary complex cystic lesion, recommend pelvic ultrasound and GYN follow-up PCP in 1 week Cardiology Dr.Tolia in 1 month Encourage weight loss and lifestyle modification   Discharge Diagnoses:  Principal Problem:   NSTEMI (non-ST elevated myocardial infarction) (HCC) Active Problems:   Leukocytosis   Dyslipidemia   Hypertriglyceridemia   Essential hypertension   Type 2 diabetes mellitus treated with insulin (HCC)   Polycythemia   POLYCYSTIC OVARIAN DISEASE   Depression   Tobacco abuse   Sleep apnea   Morbid obesity (HCC)   Pure hypertriglyceridemia   Mixed hyperlipidemia   Class 3 severe obesity due to excess calories with serious comorbidity and body mass index (BMI) of 40.0 to 44.9 in adult [facility])   Acute coronary syndrome (HCC)   Coronary atherosclerosis due to calcified coronary lesion   Atherosclerosis of aorta (HCC)   Cigarette smoker   S/P drug eluting coronary stent placement   Coronary artery disease involving native coronary artery of native heart without angina pectoris   Discharge Condition: Improved  Diet recommendation: Hepatic, low-sodium, heart healthy  Filed Weights   07/23/23 0258 07/23/23 1743 07/27/23 0429  Weight: 117.9 kg 116 kg 115 kg    History of present illness:  47 y.o. female with a history of IDDM, HLD with hypertriglyceridemia with chronic pancreatitis, CAD, tobacco abuse, OSA on CPAP, and morbid obesity presented with chest pain felt atypical but with elevated troponin.   Hospital Course:   NSTEMI Chest pain -- Presented with atypical chest pain, at bedtime troponin trended up to 03/16/2002 -- Cards consulting, underwent left heart cath  found to have 100% occlusion of her LCX with  collaterals that was successfully treated with DES 3.25 x 12 mm stent and post-dilated. Did have residual disease in the LAD with decision to treat medically as PCI of the proximal to mid LAD would potentially jail a moderate to large diagonal branch -Per cardiology consider CABG after 3 months of DAPT if she fails medical therapy -Also started on Imdur, continue aspirin, Brilinta for 12 months, metoprolol and statin   Hypertension -- continue lisinopril and metoprolol   Hyperlipidemia with LDL goal < 55 -- Cards increase Lipitor dose and started on Zetia   IDDM -- A1c 6.5%, on insulin and farxiga -- Continue long-acting insulin, dose decreased   Current smoker -- Counseled   Obesity OSA on CPAP -- continued -Needs weight loss, diet and lifestyle modification   Enlarged left ovary --, History of polycystic ovarian disease, could be related to this however recommend pelvic ultrasound as outpatient follow-up with GYN    Procedures: Left heart cath  Consultations: Cardiology Discharge Exam: Vitals:   07/26/23 1938 07/27/23 0429  BP: (!) 103/59 116/71  Pulse: 64 (!) 51  Resp: 19 17  Temp: 98.8 F (37.1 C) 98.1 F (36.7 C)  SpO2: 94% 92%  Gen: Obese pleasant female awake, Alert, Oriented X 3,  HEENT: no JVD Lungs: Good air movement bilaterally, CTAB CVS: S1S2/RRR Abd: soft, Non tender, non distended, BS present Extremities: No edema Skin: no new rashes on exposed skin   Discharge Instructions   Discharge Instructions     Amb Referral to Cardiac Rehabilitation   Complete by: As directed    Diagnosis:  Coronary Stents NSTEMI     After initial  ancillary and laboratory) are listed below for reference.    Significant Diagnostic Studies: CARDIAC CATHETERIZATION  Result Date: 07/23/2023   Prox Cx to Mid Cx lesion is 100% stenosed.   Mid LAD lesion is 50% stenosed.   Prox LAD to Mid LAD lesion is 70% stenosed.   1st Diag lesion is 50% stenosed.   2nd Diag lesion is 90% stenosed.   Mid LAD to Dist LAD lesion is 50% stenosed.   Mid RCA lesion is 60% stenosed.   Dist RCA lesion is 40% stenosed.   A drug-eluting stent was successfully placed using a SYNERGY XD 3.0X12.   Post intervention, there is a 0% residual stenosis. NSTEMI secondary to thrombotic occlusion of the proximal Circumflex artery. Collateral filling of the distal Circumflex and OM branches from right to left collaterals. Successful PTCA/DES x 1 proximal Circumflex Severe stenosis in the heavily calcified mid LAD involving two diagonal branches Moderate non-obstructive disease in the large dominant RCA LVEDP 11 mmHg  Recommendations: Admit to the ICU. Aggrastat drip for 2 hours. DAPT with ASA and Brilinta for one year. High intensity statin. Echo tomorrow. Will review the lesions in the LAD with the IC team. I would favor medical management of her LAD disease for now. PCI of the proximal to mid LAD would potentially jeopardize flow into the Diagonal branches which are moderate to large in caliber. Could consider bypass after 3 months of DAPT.   ECHOCARDIOGRAM COMPLETE  Result Date: 07/23/2023    ECHOCARDIOGRAM REPORT   Patient Name:   Renee Ho Date of Exam: 07/23/2023 Medical Rec #:  [id number]                  Height:       65.0 in Accession #:    [id number]                 Weight:       260.0 lb Date of Birth:  June 18, 1976                   BSA:          2.211 m Patient Age:    47 years                   BP:           140/70 mmHg Patient Gender: F                          HR:           66 bpm. Exam Location:  Inpatient Procedure: 2D Echo, Color Doppler, Cardiac Doppler and Intracardiac            Opacification Agent Indications:    Acute myocardial infarction  History:        Patient has no prior history of Echocardiogram examinations.                 Risk Factors:Diabetes, Dyslipidemia, Hypertension, Former Smoker                 and Obstructive Sleep Apnea.  Sonographer:    Raeford Razor RDCS Referring Phys: [id number] [facility]  Sonographer Comments: Technically difficult study due to poor echo windows, suboptimal apical window and patient is obese. Image acquisition challenging due to patient body habitus. IMPRESSIONS  1. Left ventricular ejection fraction, by estimation, is 60 to 65%. The left ventricle has normal function. The left  Physician Discharge Summary  Renee Ho ZOX:[id number] DOB: 1975/11/29 DOA: 07/23/2023  PCP: Melida Quitter, PA  Admit date: 07/23/2023 Discharge date: 07/27/2023  Time spent: 35 minutes  Recommendations for Outpatient Follow-up:  Left ovary complex cystic lesion, recommend pelvic ultrasound and GYN follow-up PCP in 1 week Cardiology Dr.Tolia in 1 month Encourage weight loss and lifestyle modification   Discharge Diagnoses:  Principal Problem:   NSTEMI (non-ST elevated myocardial infarction) (HCC) Active Problems:   Leukocytosis   Dyslipidemia   Hypertriglyceridemia   Essential hypertension   Type 2 diabetes mellitus treated with insulin (HCC)   Polycythemia   POLYCYSTIC OVARIAN DISEASE   Depression   Tobacco abuse   Sleep apnea   Morbid obesity (HCC)   Pure hypertriglyceridemia   Mixed hyperlipidemia   Class 3 severe obesity due to excess calories with serious comorbidity and body mass index (BMI) of 40.0 to 44.9 in adult [facility])   Acute coronary syndrome (HCC)   Coronary atherosclerosis due to calcified coronary lesion   Atherosclerosis of aorta (HCC)   Cigarette smoker   S/P drug eluting coronary stent placement   Coronary artery disease involving native coronary artery of native heart without angina pectoris   Discharge Condition: Improved  Diet recommendation: Hepatic, low-sodium, heart healthy  Filed Weights   07/23/23 0258 07/23/23 1743 07/27/23 0429  Weight: 117.9 kg 116 kg 115 kg    History of present illness:  47 y.o. female with a history of IDDM, HLD with hypertriglyceridemia with chronic pancreatitis, CAD, tobacco abuse, OSA on CPAP, and morbid obesity presented with chest pain felt atypical but with elevated troponin.   Hospital Course:   NSTEMI Chest pain -- Presented with atypical chest pain, at bedtime troponin trended up to 03/16/2002 -- Cards consulting, underwent left heart cath  found to have 100% occlusion of her LCX with  collaterals that was successfully treated with DES 3.25 x 12 mm stent and post-dilated. Did have residual disease in the LAD with decision to treat medically as PCI of the proximal to mid LAD would potentially jail a moderate to large diagonal branch -Per cardiology consider CABG after 3 months of DAPT if she fails medical therapy -Also started on Imdur, continue aspirin, Brilinta for 12 months, metoprolol and statin   Hypertension -- continue lisinopril and metoprolol   Hyperlipidemia with LDL goal < 55 -- Cards increase Lipitor dose and started on Zetia   IDDM -- A1c 6.5%, on insulin and farxiga -- Continue long-acting insulin, dose decreased   Current smoker -- Counseled   Obesity OSA on CPAP -- continued -Needs weight loss, diet and lifestyle modification   Enlarged left ovary --, History of polycystic ovarian disease, could be related to this however recommend pelvic ultrasound as outpatient follow-up with GYN    Procedures: Left heart cath  Consultations: Cardiology Discharge Exam: Vitals:   07/26/23 1938 07/27/23 0429  BP: (!) 103/59 116/71  Pulse: 64 (!) 51  Resp: 19 17  Temp: 98.8 F (37.1 C) 98.1 F (36.7 C)  SpO2: 94% 92%  Gen: Obese pleasant female awake, Alert, Oriented X 3,  HEENT: no JVD Lungs: Good air movement bilaterally, CTAB CVS: S1S2/RRR Abd: soft, Non tender, non distended, BS present Extremities: No edema Skin: no new rashes on exposed skin   Discharge Instructions   Discharge Instructions     Amb Referral to Cardiac Rehabilitation   Complete by: As directed    Diagnosis:  Coronary Stents NSTEMI     After initial  ancillary and laboratory) are listed below for reference.    Significant Diagnostic Studies: CARDIAC CATHETERIZATION  Result Date: 07/23/2023   Prox Cx to Mid Cx lesion is 100% stenosed.   Mid LAD lesion is 50% stenosed.   Prox LAD to Mid LAD lesion is 70% stenosed.   1st Diag lesion is 50% stenosed.   2nd Diag lesion is 90% stenosed.   Mid LAD to Dist LAD lesion is 50% stenosed.   Mid RCA lesion is 60% stenosed.   Dist RCA lesion is 40% stenosed.   A drug-eluting stent was successfully placed using a SYNERGY XD 3.0X12.   Post intervention, there is a 0% residual stenosis. NSTEMI secondary to thrombotic occlusion of the proximal Circumflex artery. Collateral filling of the distal Circumflex and OM branches from right to left collaterals. Successful PTCA/DES x 1 proximal Circumflex Severe stenosis in the heavily calcified mid LAD involving two diagonal branches Moderate non-obstructive disease in the large dominant RCA LVEDP 11 mmHg  Recommendations: Admit to the ICU. Aggrastat drip for 2 hours. DAPT with ASA and Brilinta for one year. High intensity statin. Echo tomorrow. Will review the lesions in the LAD with the IC team. I would favor medical management of her LAD disease for now. PCI of the proximal to mid LAD would potentially jeopardize flow into the Diagonal branches which are moderate to large in caliber. Could consider bypass after 3 months of DAPT.   ECHOCARDIOGRAM COMPLETE  Result Date: 07/23/2023    ECHOCARDIOGRAM REPORT   Patient Name:   Renee Ho Date of Exam: 07/23/2023 Medical Rec #:  [id number]                  Height:       65.0 in Accession #:    [id number]                 Weight:       260.0 lb Date of Birth:  June 18, 1976                   BSA:          2.211 m Patient Age:    47 years                   BP:           140/70 mmHg Patient Gender: F                          HR:           66 bpm. Exam Location:  Inpatient Procedure: 2D Echo, Color Doppler, Cardiac Doppler and Intracardiac            Opacification Agent Indications:    Acute myocardial infarction  History:        Patient has no prior history of Echocardiogram examinations.                 Risk Factors:Diabetes, Dyslipidemia, Hypertension, Former Smoker                 and Obstructive Sleep Apnea.  Sonographer:    Raeford Razor RDCS Referring Phys: [id number] [facility]  Sonographer Comments: Technically difficult study due to poor echo windows, suboptimal apical window and patient is obese. Image acquisition challenging due to patient body habitus. IMPRESSIONS  1. Left ventricular ejection fraction, by estimation, is 60 to 65%. The left ventricle has normal function. The left  ancillary and laboratory) are listed below for reference.    Significant Diagnostic Studies: CARDIAC CATHETERIZATION  Result Date: 07/23/2023   Prox Cx to Mid Cx lesion is 100% stenosed.   Mid LAD lesion is 50% stenosed.   Prox LAD to Mid LAD lesion is 70% stenosed.   1st Diag lesion is 50% stenosed.   2nd Diag lesion is 90% stenosed.   Mid LAD to Dist LAD lesion is 50% stenosed.   Mid RCA lesion is 60% stenosed.   Dist RCA lesion is 40% stenosed.   A drug-eluting stent was successfully placed using a SYNERGY XD 3.0X12.   Post intervention, there is a 0% residual stenosis. NSTEMI secondary to thrombotic occlusion of the proximal Circumflex artery. Collateral filling of the distal Circumflex and OM branches from right to left collaterals. Successful PTCA/DES x 1 proximal Circumflex Severe stenosis in the heavily calcified mid LAD involving two diagonal branches Moderate non-obstructive disease in the large dominant RCA LVEDP 11 mmHg  Recommendations: Admit to the ICU. Aggrastat drip for 2 hours. DAPT with ASA and Brilinta for one year. High intensity statin. Echo tomorrow. Will review the lesions in the LAD with the IC team. I would favor medical management of her LAD disease for now. PCI of the proximal to mid LAD would potentially jeopardize flow into the Diagonal branches which are moderate to large in caliber. Could consider bypass after 3 months of DAPT.   ECHOCARDIOGRAM COMPLETE  Result Date: 07/23/2023    ECHOCARDIOGRAM REPORT   Patient Name:   Renee Ho Date of Exam: 07/23/2023 Medical Rec #:  [id number]                  Height:       65.0 in Accession #:    [id number]                 Weight:       260.0 lb Date of Birth:  June 18, 1976                   BSA:          2.211 m Patient Age:    47 years                   BP:           140/70 mmHg Patient Gender: F                          HR:           66 bpm. Exam Location:  Inpatient Procedure: 2D Echo, Color Doppler, Cardiac Doppler and Intracardiac            Opacification Agent Indications:    Acute myocardial infarction  History:        Patient has no prior history of Echocardiogram examinations.                 Risk Factors:Diabetes, Dyslipidemia, Hypertension, Former Smoker                 and Obstructive Sleep Apnea.  Sonographer:    Raeford Razor RDCS Referring Phys: [id number] [facility]  Sonographer Comments: Technically difficult study due to poor echo windows, suboptimal apical window and patient is obese. Image acquisition challenging due to patient body habitus. IMPRESSIONS  1. Left ventricular ejection fraction, by estimation, is 60 to 65%. The left ventricle has normal function. The left  Physician Discharge Summary  Renee Ho ZOX:[id number] DOB: 1975/11/29 DOA: 07/23/2023  PCP: Melida Quitter, PA  Admit date: 07/23/2023 Discharge date: 07/27/2023  Time spent: 35 minutes  Recommendations for Outpatient Follow-up:  Left ovary complex cystic lesion, recommend pelvic ultrasound and GYN follow-up PCP in 1 week Cardiology Dr.Tolia in 1 month Encourage weight loss and lifestyle modification   Discharge Diagnoses:  Principal Problem:   NSTEMI (non-ST elevated myocardial infarction) (HCC) Active Problems:   Leukocytosis   Dyslipidemia   Hypertriglyceridemia   Essential hypertension   Type 2 diabetes mellitus treated with insulin (HCC)   Polycythemia   POLYCYSTIC OVARIAN DISEASE   Depression   Tobacco abuse   Sleep apnea   Morbid obesity (HCC)   Pure hypertriglyceridemia   Mixed hyperlipidemia   Class 3 severe obesity due to excess calories with serious comorbidity and body mass index (BMI) of 40.0 to 44.9 in adult [facility])   Acute coronary syndrome (HCC)   Coronary atherosclerosis due to calcified coronary lesion   Atherosclerosis of aorta (HCC)   Cigarette smoker   S/P drug eluting coronary stent placement   Coronary artery disease involving native coronary artery of native heart without angina pectoris   Discharge Condition: Improved  Diet recommendation: Hepatic, low-sodium, heart healthy  Filed Weights   07/23/23 0258 07/23/23 1743 07/27/23 0429  Weight: 117.9 kg 116 kg 115 kg    History of present illness:  47 y.o. female with a history of IDDM, HLD with hypertriglyceridemia with chronic pancreatitis, CAD, tobacco abuse, OSA on CPAP, and morbid obesity presented with chest pain felt atypical but with elevated troponin.   Hospital Course:   NSTEMI Chest pain -- Presented with atypical chest pain, at bedtime troponin trended up to 03/16/2002 -- Cards consulting, underwent left heart cath  found to have 100% occlusion of her LCX with  collaterals that was successfully treated with DES 3.25 x 12 mm stent and post-dilated. Did have residual disease in the LAD with decision to treat medically as PCI of the proximal to mid LAD would potentially jail a moderate to large diagonal branch -Per cardiology consider CABG after 3 months of DAPT if she fails medical therapy -Also started on Imdur, continue aspirin, Brilinta for 12 months, metoprolol and statin   Hypertension -- continue lisinopril and metoprolol   Hyperlipidemia with LDL goal < 55 -- Cards increase Lipitor dose and started on Zetia   IDDM -- A1c 6.5%, on insulin and farxiga -- Continue long-acting insulin, dose decreased   Current smoker -- Counseled   Obesity OSA on CPAP -- continued -Needs weight loss, diet and lifestyle modification   Enlarged left ovary --, History of polycystic ovarian disease, could be related to this however recommend pelvic ultrasound as outpatient follow-up with GYN    Procedures: Left heart cath  Consultations: Cardiology Discharge Exam: Vitals:   07/26/23 1938 07/27/23 0429  BP: (!) 103/59 116/71  Pulse: 64 (!) 51  Resp: 19 17  Temp: 98.8 F (37.1 C) 98.1 F (36.7 C)  SpO2: 94% 92%  Gen: Obese pleasant female awake, Alert, Oriented X 3,  HEENT: no JVD Lungs: Good air movement bilaterally, CTAB CVS: S1S2/RRR Abd: soft, Non tender, non distended, BS present Extremities: No edema Skin: no new rashes on exposed skin   Discharge Instructions   Discharge Instructions     Amb Referral to Cardiac Rehabilitation   Complete by: As directed    Diagnosis:  Coronary Stents NSTEMI     After initial

## 2023-07-27 NOTE — Telephone Encounter (Signed)
Transition of Care Follow-up Phone Call Request    Patient Name: Renee Ho Date of Birth: 1976/04/03 Date of Encounter: 07/27/2023  Primary Care Provider:  Melida Quitter, PA Primary Cardiologist:  Tessa Lerner, DO  [name] has been scheduled for a transition of care follow up appointment with a [facility] provider:  Jari Favre 11/1  Please reach out to [name] within 48 hours of discharge to confirm appointment and review transition of care protocol questionnaire. Anticipated discharge date: 10/16  Laverda Page, NP  07/27/2023, 8:39 AM

## 2023-07-27 NOTE — Progress Notes (Signed)
Pt sts she has been walking numerous times independently without CP. Feels well. Reviewed ed, esp quitting smoking. Pt would like nicotine patch for d/c, which she is already on. [email or web address] [id number] Ethelda Chick BS, ACSM-CEP 07/27/2023 9:04 AM

## 2023-07-27 NOTE — Progress Notes (Addendum)
Patient Name: Renee Ho Date of Encounter: 07/27/2023 [facility] Cardiologist: Tessa Lerner, DO   Interval Summary  .    No chest pain overnight. Headache has resolved.   Vital Signs .    Vitals:   07/26/23 0920 07/26/23 1134 07/26/23 1938 07/27/23 0429  BP: 104/64 113/62 (!) 103/59 116/71  Pulse: 63 68 64 (!) 51  Resp:  18 19 17   Temp:  97.6 F (36.4 C) 98.8 F (37.1 C) 98.1 F (36.7 C)  TempSrc:  Oral Oral Oral  SpO2:  96% 94% 92%  Weight:    115 kg  Height:        Intake/Output Summary (Last 24 hours) at 07/27/2023 0745 Last data filed at 07/26/2023 2115 Gross per 24 hour  Intake 285.46 ml  Output --  Net 285.46 ml      07/27/2023    4:29 AM 07/23/2023    5:43 PM 07/23/2023    2:58 AM  Last 3 Weights  Weight (lbs) 253 lb 8.5 oz 255 lb 11.7 oz 260 lb  Weight (kg) 115 kg 116 kg 117.935 kg      Telemetry/ECG    Sinus Bradycardia - Personally Reviewed  Physical Exam .   GEN: No acute distress.   Neck: No JVD Cardiac: RRR, no murmurs, rubs, or gallops.  Respiratory: Clear to auscultation bilaterally. GI: Soft, nontender, non-distended  MS: No edema  Assessment & Plan .     47 y.o. female with a history of IDDM, HLD with hypertriglyceridemia with chronic pancreatitis, CAD, tobacco abuse, OSA on CPAP, and morbid obesity presented with chest pain felt atypical but with elevated troponin.    NSTEMI Chest pain -- although CP felt atypical, she continued to have chest pain with HS troponin that trended to 6603. EKG was nonischemic -- underwent cardiac cath as urgent NSTEMI on Sat found to have 100% occlusion of her LCX with collaterals that was successfully treated with DES 3.25 x 12 mm stent and post-dilated. Did have residual disease in the LAD with decision to treat medically as PCI of the proximal to mid LAD would potentially jail a moderate to large diagonal branch -- could consider CABG after 3 months of DAPT if she fails  medical therapy -- episode if chest pain post cath, EKG non-ischemic. hsTn cycled 3412>>4022 -- treated with IV nitroglycerin, no further episodes of chest pain. IV NTG weaned and transitioned to Imdur 30mg  daily -- continue DAPT with ASA and brilinta x 12 months for ACS presentation   Hypertension -- continue lisinopril and BB   Hyperlipidemia with LDL goal < 55 -- lower goal for smoking and DM -- 07/23/2023: Cholesterol 180; HDL 28; LDL Cholesterol 101; Triglycerides 253; VLDL 51 -- LP (a) pending -- continue 80 mg lipitor, 10 mg zetia   IDDM -- A1c 6.5%, on insulin and farxiga -- per primary   Current smoker -- Cessation recommended   OSA on CPAP -- continue   Enlarged left ovary -- noted on CTA to rule out dissection -- per primary  Will arrange follow up in the office.   For questions or updates, please contact [facility] Please consult [email or web address] for contact info under        Signed, Laverda Page, NP    Patient seen and examined. Agree with assessment and plan. No recurrent chest pain Medical therapy for concomitant CAD, with potential to add amlodipine and possible ranolazine if necessary. Pt is committed to smoking cessation. For dc  today.   Lennette Bihari, MD, [facility] 07/27/2023 10:14 AM

## 2023-07-27 NOTE — Progress Notes (Signed)
   07/26/23 2328  BiPAP/CPAP/SIPAP  $ Non-Invasive Home Ventilator  Subsequent  BiPAP/CPAP/SIPAP Pt Type Adult  BiPAP/CPAP/SIPAP Resmed  Mask Type Full face mask  Mask Size Medium  Patient Home Equipment Yes

## 2023-07-29 ENCOUNTER — Encounter (HOSPITAL_COMMUNITY): Payer: Self-pay

## 2023-07-29 NOTE — Telephone Encounter (Signed)
Left message to call back  

## 2023-08-03 NOTE — Telephone Encounter (Signed)
Unable to reach patient within allotted time frame for [facility] call.

## 2023-08-03 NOTE — Plan of Care (Signed)
CHL Tonsillectomy/Adenoidectomy, Postoperative PEDS care plan entered in error.

## 2023-08-06 DIAGNOSIS — G4730 Sleep apnea, unspecified: Secondary | ICD-10-CM | POA: Diagnosis not present

## 2023-08-06 DIAGNOSIS — G4733 Obstructive sleep apnea (adult) (pediatric): Secondary | ICD-10-CM | POA: Diagnosis not present

## 2023-08-12 ENCOUNTER — Encounter: Payer: Self-pay | Admitting: Physician Assistant

## 2023-08-12 ENCOUNTER — Other Ambulatory Visit (HOSPITAL_COMMUNITY): Payer: Self-pay

## 2023-08-12 ENCOUNTER — Encounter: Payer: Self-pay | Admitting: *Deleted

## 2023-08-12 ENCOUNTER — Other Ambulatory Visit: Payer: Self-pay | Admitting: *Deleted

## 2023-08-12 ENCOUNTER — Ambulatory Visit: Payer: 59 | Attending: Physician Assistant | Admitting: Physician Assistant

## 2023-08-12 VITALS — BP 102/60 | HR 75 | Ht 65.0 in | Wt 253.0 lb

## 2023-08-12 DIAGNOSIS — E1159 Type 2 diabetes mellitus with other circulatory complications: Secondary | ICD-10-CM

## 2023-08-12 DIAGNOSIS — E785 Hyperlipidemia, unspecified: Secondary | ICD-10-CM

## 2023-08-12 DIAGNOSIS — I214 Non-ST elevation (NSTEMI) myocardial infarction: Secondary | ICD-10-CM

## 2023-08-12 DIAGNOSIS — Z09 Encounter for follow-up examination after completed treatment for conditions other than malignant neoplasm: Secondary | ICD-10-CM

## 2023-08-12 DIAGNOSIS — Z720 Tobacco use: Secondary | ICD-10-CM | POA: Diagnosis not present

## 2023-08-12 DIAGNOSIS — Z794 Long term (current) use of insulin: Secondary | ICD-10-CM | POA: Diagnosis not present

## 2023-08-12 DIAGNOSIS — E1169 Type 2 diabetes mellitus with other specified complication: Secondary | ICD-10-CM

## 2023-08-12 DIAGNOSIS — I152 Hypertension secondary to endocrine disorders: Secondary | ICD-10-CM

## 2023-08-12 DIAGNOSIS — I10 Essential (primary) hypertension: Secondary | ICD-10-CM | POA: Diagnosis not present

## 2023-08-12 DIAGNOSIS — E1165 Type 2 diabetes mellitus with hyperglycemia: Secondary | ICD-10-CM | POA: Diagnosis not present

## 2023-08-12 MED ORDER — HYDROCHLOROTHIAZIDE 25 MG PO TABS
25.0000 mg | ORAL_TABLET | Freq: Every day | ORAL | 2 refills | Status: DC
  Filled 2023-08-12: qty 90, 90d supply, fill #0

## 2023-08-12 MED ORDER — DAPAGLIFLOZIN PROPANEDIOL 10 MG PO TABS
10.0000 mg | ORAL_TABLET | Freq: Every day | ORAL | 3 refills | Status: DC
  Filled 2023-08-12: qty 90, 90d supply, fill #0

## 2023-08-12 MED ORDER — EZETIMIBE 10 MG PO TABS
10.0000 mg | ORAL_TABLET | Freq: Every day | ORAL | 3 refills | Status: DC
  Filled 2023-08-12: qty 90, 90d supply, fill #0

## 2023-08-12 MED ORDER — FENOFIBRATE 160 MG PO TABS
160.0000 mg | ORAL_TABLET | Freq: Every day | ORAL | 3 refills | Status: DC
  Filled 2023-08-12: qty 90, 90d supply, fill #0

## 2023-08-12 MED ORDER — ATORVASTATIN CALCIUM 80 MG PO TABS
80.0000 mg | ORAL_TABLET | Freq: Every day | ORAL | 3 refills | Status: DC
  Filled 2023-08-12: qty 90, 90d supply, fill #0

## 2023-08-12 MED ORDER — TICAGRELOR 90 MG PO TABS
90.0000 mg | ORAL_TABLET | Freq: Two times a day (BID) | ORAL | 3 refills | Status: DC
  Filled 2023-08-12: qty 180, 90d supply, fill #0

## 2023-08-12 MED ORDER — LISINOPRIL 5 MG PO TABS
5.0000 mg | ORAL_TABLET | Freq: Every day | ORAL | 3 refills | Status: DC
  Filled 2023-08-12: qty 90, 90d supply, fill #0

## 2023-08-12 MED ORDER — ISOSORBIDE MONONITRATE ER 30 MG PO TB24
30.0000 mg | ORAL_TABLET | Freq: Every day | ORAL | 3 refills | Status: DC
  Filled 2023-08-12: qty 90, 90d supply, fill #0

## 2023-08-12 MED ORDER — METOPROLOL TARTRATE 25 MG PO TABS
25.0000 mg | ORAL_TABLET | Freq: Two times a day (BID) | ORAL | 3 refills | Status: DC
  Filled 2023-08-12: qty 180, 90d supply, fill #0

## 2023-08-12 NOTE — Progress Notes (Signed)
Cardiology Office Note:  .   Date:  08/12/2023  ID:  Renee Ho, DOB 14-Nov-1975, MRN [id number] PCP: Melida Quitter, PA  [facility] Providers Cardiologist:  Crystallee Werden Lerner, DO {  History of Present Illness: .   Renee Ho is a 47 y.o. female with a past medical history of IDDM, HLD, chronic pancreatitis, CAD, tobacco abuse, OSA on CPAP, and morbid obesity here for hospital follow-up.  He presented to the hospital with chest pain which was atypical but had an elevated troponin.  Diagnosed with NSTEMI.  Cardiology was consulted and he underwent left heart cath and found 100% occlusion of the LCx with collaterals that was successfully treated with DES and postdilated.  Did have residual disease in the LAD with decision to treat medically as PCI of the proximal to mid LAD would potentially JL a moderate to large diagonal branch.  Consider CABG after 3 months of DAPT if she fails medical therapy.  All started on Imdur, aspirin, Brilinta for 12 months as well as metoprolol and a statin.  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 The patient reports that these symptoms have persisted since the heart attack and have not improved despite the stent placement.She describes a physically demanding job, which includes lifting and transferring patients, and expresses concern about her ability to perform these tasks due to their current symptoms.  The patient also reports high levels of anxiety, primarily driven by the fear of another heart attack and the potential need for bypass surgery in the future. She has made significant lifestyle changes since the heart attack, including dietary modifications and quitting smoking, and is compliant with all prescribed medications. However, these changes have not alleviated the patient's symptoms or  anxiety.  Reports no chest pain, pressure, or tightness. No edema, orthopnea, PND. Reports no palpitations.   ROS: pertinent ROS in HPI  Studies Reviewed: [name]   EKG Interpretation Date/Time:  Friday August 12 2023 14:51:26 EDT Ventricular Rate:  76 PR Interval:  168 QRS Duration:  82 QT Interval:  406 QTC Calculation: 456 R Axis:   60  Text Interpretation: Normal sinus rhythm Low voltage QRS When compared with ECG of 25-Jul-2023 08:50, No significant change was found Confirmed by Jari Favre [phone]) on 08/12/2023 2:54:21 PM   Cardiac cath 07/23/2023   Prox Cx to Mid Cx lesion is 100% stenosed.   Mid LAD lesion is 50% stenosed.   Prox LAD to Mid LAD lesion is 70% stenosed.   1st Diag lesion is 50% stenosed.   2nd Diag lesion is 90% stenosed.   Mid LAD to Dist LAD lesion is 50% stenosed.   Mid RCA lesion is 60% stenosed.   Dist RCA lesion is 40% stenosed.   A drug-eluting stent was successfully placed using a SYNERGY XD 3.0X12.   Post intervention, there is a 0% residual stenosis.   NSTEMI secondary to thrombotic occlusion of the proximal Circumflex artery. Collateral filling of the distal Circumflex and OM branches from right to left collaterals. Successful PTCA/DES x 1 proximal Circumflex Severe stenosis in the heavily calcified mid LAD involving two diagonal branches Moderate non-obstructive disease in the large dominant RCA LVEDP 11 mmHg   Recommendations: Admit to the ICU. Aggrastat drip for 2 hours. DAPT with ASA and Brilinta for one year. High intensity statin. Echo tomorrow. Will review the lesions in the LAD with the  IC team. I would favor medical management of her LAD disease for now. PCI of the proximal to mid LAD would potentially jeopardize flow into the Diagonal branches which are moderate to large in caliber. Could consider bypass after 3 months of DAPT.    Physical Exam:   VS:  BP 102/60   Pulse 75   Ht 5\' 5"  (1.651 m)   Wt 253 lb (114.8 kg)   SpO2 95%   BMI  42.10 kg/m    Wt Readings from Last 3 Encounters:  08/12/23 253 lb (114.8 kg)  07/27/23 253 lb 8.5 oz (115 kg)  06/30/23 260 lb (117.9 kg)    GEN: Well nourished, well developed in no acute distress NECK: No JVD; No carotid bruits CARDIAC: RRR, no murmurs, rubs, gallops RESPIRATORY:  Clear to auscultation without rales, wheezing or rhonchi  ABDOMEN: Soft, non-tender, non-distended EXTREMITIES:  No edema; No deformity   ASSESSMENT AND PLAN: .    Coronary Artery Disease s/p NSTEMI and PCI Recent myocardial infarction with stent placement in the circumflex artery. Persistent 90% blockage in the 2nd diagonal artery. Patient reports increased shortness of breath and exercise intolerance since the heart attack. Discussed the possibility of future bypass surgery. -Will discuss consult with cardiothoracic surgeons for potential bypass surgery. -Continue current medications and lifestyle modifications (diet, smoking cessation). -Check lipid panel in a few weeks to assess LDL control with increased Atorvastatin dose.  Anxiety High level of anxiety related to cardiac condition and potential future surgery. -Consider consultation with social worker or mental health professional for additional support.  Work Status Patient has a physically demanding job and is currently unable to work due to symptoms. -Provide work note for continued leave until September 12, 2023. -we will fill out her FMLA paperwork once it is sent to [place]    Cardiac Rehabilitation Eligibility Assessment  The patient is NOT ready to start cardiac rehabilitation due to: Other      Dispo: She can f/u in two months with me  Signed, Sharlene Dory, PA-C

## 2023-08-12 NOTE — Patient Instructions (Signed)
Medication Instructions:   Your physician recommends that you continue on your current medications as directed. Please refer to the Current Medication list given to you today.   *If you need a refill on your cardiac medications before your next appointment, please call your pharmacy*   Lab Work:  Your physician recommends that you return for a FASTING lipid profile in 6 weeks with paperwork. You can come in on the day of your appointment anytime between 7:30 am -4:30 pm fasting from midnight the night before. Closed from 12:30-1:30 for lunch.    If you have labs (blood work) drawn today and your tests are completely normal, you will receive your results only by: MyChart Message (if you have MyChart) OR A paper copy in the mail If you have any lab test that is abnormal or we need to change your treatment, we will call you to review the results.   Testing/Procedures:  None ordered.   Follow-Up: At [facility], you and your health needs are our priority.  As part of our continuing mission to provide you with exceptional heart care, we have created designated Provider Care Teams.  These Care Teams include your primary Cardiologist (physician) and Advanced Practice Providers (APPs -  Physician Assistants and Nurse Practitioners) who all work together to provide you with the care you need, when you need it.  We recommend signing up for the patient portal called "MyChart".  Sign up information is provided on this After Visit Summary.  MyChart is used to connect with patients for Virtual Visits (Telemedicine).  Patients are able to view lab/test results, encounter notes, upcoming appointments, etc.  Non-urgent messages can be sent to your provider as well.   To learn more about what you can do with MyChart, go to [email or web address].    Your next appointment:   2 month(s)  Provider:   Jari Favre, PA-C         Other Instructions  Patient was given a work note today for  light duty and return to work date.

## 2023-08-14 ENCOUNTER — Emergency Department (HOSPITAL_COMMUNITY): Payer: 59

## 2023-08-14 ENCOUNTER — Inpatient Hospital Stay (HOSPITAL_COMMUNITY)
Admission: EM | Admit: 2023-08-14 | Discharge: 2023-08-24 | DRG: 235 | Disposition: A | Discharge status: Rehabilitation Facility | Payer: 59 | Attending: Thoracic Surgery (Cardiothoracic Vascular Surgery) | Admitting: Thoracic Surgery (Cardiothoracic Vascular Surgery)

## 2023-08-14 ENCOUNTER — Other Ambulatory Visit: Payer: Self-pay

## 2023-08-14 ENCOUNTER — Encounter (HOSPITAL_COMMUNITY): Payer: Self-pay | Admitting: Emergency Medicine

## 2023-08-14 DIAGNOSIS — Z713 Dietary counseling and surveillance: Secondary | ICD-10-CM

## 2023-08-14 DIAGNOSIS — E7800 Pure hypercholesterolemia, unspecified: Secondary | ICD-10-CM | POA: Diagnosis present

## 2023-08-14 DIAGNOSIS — Z885 Allergy status to narcotic agent status: Secondary | ICD-10-CM

## 2023-08-14 DIAGNOSIS — R079 Chest pain, unspecified: Secondary | ICD-10-CM

## 2023-08-14 DIAGNOSIS — Z955 Presence of coronary angioplasty implant and graft: Secondary | ICD-10-CM

## 2023-08-14 DIAGNOSIS — E1169 Type 2 diabetes mellitus with other specified complication: Secondary | ICD-10-CM | POA: Diagnosis present

## 2023-08-14 DIAGNOSIS — F32A Depression, unspecified: Secondary | ICD-10-CM | POA: Diagnosis not present

## 2023-08-14 DIAGNOSIS — I119 Hypertensive heart disease without heart failure: Secondary | ICD-10-CM | POA: Diagnosis present

## 2023-08-14 DIAGNOSIS — I700 Atherosclerosis of aorta: Secondary | ICD-10-CM | POA: Diagnosis present

## 2023-08-14 DIAGNOSIS — Z951 Presence of aortocoronary bypass graft: Secondary | ICD-10-CM

## 2023-08-14 DIAGNOSIS — R0609 Other forms of dyspnea: Secondary | ICD-10-CM

## 2023-08-14 DIAGNOSIS — E782 Mixed hyperlipidemia: Secondary | ICD-10-CM | POA: Diagnosis not present

## 2023-08-14 DIAGNOSIS — I200 Unstable angina: Secondary | ICD-10-CM

## 2023-08-14 DIAGNOSIS — I2510 Atherosclerotic heart disease of native coronary artery without angina pectoris: Principal | ICD-10-CM | POA: Diagnosis present

## 2023-08-14 DIAGNOSIS — Z83438 Family history of other disorder of lipoprotein metabolism and other lipidemia: Secondary | ICD-10-CM

## 2023-08-14 DIAGNOSIS — R7989 Other specified abnormal findings of blood chemistry: Secondary | ICD-10-CM

## 2023-08-14 DIAGNOSIS — I152 Hypertension secondary to endocrine disorders: Secondary | ICD-10-CM | POA: Diagnosis present

## 2023-08-14 DIAGNOSIS — F419 Anxiety disorder, unspecified: Secondary | ICD-10-CM | POA: Diagnosis present

## 2023-08-14 DIAGNOSIS — D696 Thrombocytopenia, unspecified: Secondary | ICD-10-CM | POA: Diagnosis not present

## 2023-08-14 DIAGNOSIS — I10 Essential (primary) hypertension: Secondary | ICD-10-CM | POA: Diagnosis present

## 2023-08-14 DIAGNOSIS — Z9109 Other allergy status, other than to drugs and biological substances: Secondary | ICD-10-CM

## 2023-08-14 DIAGNOSIS — I471 Supraventricular tachycardia, unspecified: Secondary | ICD-10-CM | POA: Diagnosis not present

## 2023-08-14 DIAGNOSIS — Z794 Long term (current) use of insulin: Secondary | ICD-10-CM | POA: Diagnosis not present

## 2023-08-14 DIAGNOSIS — Z7902 Long term (current) use of antithrombotics/antiplatelets: Secondary | ICD-10-CM

## 2023-08-14 DIAGNOSIS — Z888 Allergy status to other drugs, medicaments and biological substances status: Secondary | ICD-10-CM

## 2023-08-14 DIAGNOSIS — Z90721 Acquired absence of ovaries, unilateral: Secondary | ICD-10-CM

## 2023-08-14 DIAGNOSIS — E1165 Type 2 diabetes mellitus with hyperglycemia: Secondary | ICD-10-CM

## 2023-08-14 DIAGNOSIS — Z87891 Personal history of nicotine dependence: Secondary | ICD-10-CM

## 2023-08-14 DIAGNOSIS — I25110 Atherosclerotic heart disease of native coronary artery with unstable angina pectoris: Secondary | ICD-10-CM | POA: Diagnosis present

## 2023-08-14 DIAGNOSIS — Z90710 Acquired absence of both cervix and uterus: Secondary | ICD-10-CM

## 2023-08-14 DIAGNOSIS — R0789 Other chest pain: Secondary | ICD-10-CM | POA: Diagnosis not present

## 2023-08-14 DIAGNOSIS — Z833 Family history of diabetes mellitus: Secondary | ICD-10-CM

## 2023-08-14 DIAGNOSIS — I209 Angina pectoris, unspecified: Secondary | ICD-10-CM | POA: Diagnosis present

## 2023-08-14 DIAGNOSIS — Z7984 Long term (current) use of oral hypoglycemic drugs: Secondary | ICD-10-CM

## 2023-08-14 DIAGNOSIS — E119 Type 2 diabetes mellitus without complications: Secondary | ICD-10-CM

## 2023-08-14 DIAGNOSIS — Z79899 Other long term (current) drug therapy: Secondary | ICD-10-CM

## 2023-08-14 DIAGNOSIS — Z6841 Body Mass Index (BMI) 40.0 and over, adult: Secondary | ICD-10-CM

## 2023-08-14 DIAGNOSIS — K5900 Constipation, unspecified: Secondary | ICD-10-CM | POA: Diagnosis present

## 2023-08-14 DIAGNOSIS — Z7982 Long term (current) use of aspirin: Secondary | ICD-10-CM

## 2023-08-14 DIAGNOSIS — I214 Non-ST elevation (NSTEMI) myocardial infarction: Secondary | ICD-10-CM | POA: Diagnosis present

## 2023-08-14 DIAGNOSIS — R001 Bradycardia, unspecified: Secondary | ICD-10-CM | POA: Diagnosis present

## 2023-08-14 DIAGNOSIS — G4733 Obstructive sleep apnea (adult) (pediatric): Secondary | ICD-10-CM | POA: Diagnosis not present

## 2023-08-14 DIAGNOSIS — Z818 Family history of other mental and behavioral disorders: Secondary | ICD-10-CM

## 2023-08-14 DIAGNOSIS — E785 Hyperlipidemia, unspecified: Secondary | ICD-10-CM | POA: Diagnosis present

## 2023-08-14 DIAGNOSIS — E282 Polycystic ovarian syndrome: Secondary | ICD-10-CM | POA: Diagnosis present

## 2023-08-14 DIAGNOSIS — Z7985 Long-term (current) use of injectable non-insulin antidiabetic drugs: Secondary | ICD-10-CM

## 2023-08-14 LAB — CBC WITH DIFFERENTIAL/PLATELET
Abs Immature Granulocytes: 0.03 10*3/uL (ref 0.00–0.07)
Basophils Absolute: 0.1 10*3/uL (ref 0.0–0.1)
Basophils Relative: 1 %
Eosinophils Absolute: 0.3 10*3/uL (ref 0.0–0.5)
Eosinophils Relative: 5 %
HCT: 35.7 % — ABNORMAL LOW (ref 36.0–46.0)
Hemoglobin: 12.2 g/dL (ref 12.0–15.0)
Immature Granulocytes: 0 %
Lymphocytes Relative: 31 %
Lymphs Abs: 2.1 10*3/uL (ref 0.7–4.0)
MCH: 31.5 pg (ref 26.0–34.0)
MCHC: 34.2 g/dL (ref 30.0–36.0)
MCV: 92.2 fL (ref 80.0–100.0)
Monocytes Absolute: 0.6 10*3/uL (ref 0.1–1.0)
Monocytes Relative: 8 %
Neutro Abs: 3.7 10*3/uL (ref 1.7–7.7)
Neutrophils Relative %: 55 %
Platelets: 155 10*3/uL (ref 150–400)
RBC: 3.87 MIL/uL (ref 3.87–5.11)
RDW: 13.1 % (ref 11.5–15.5)
WBC: 6.7 10*3/uL (ref 4.0–10.5)
nRBC: 0 % (ref 0.0–0.2)

## 2023-08-14 LAB — CBC
HCT: 44.4 % (ref 36.0–46.0)
Hemoglobin: 14.9 g/dL (ref 12.0–15.0)
MCH: 30.3 pg (ref 26.0–34.0)
MCHC: 33.6 g/dL (ref 30.0–36.0)
MCV: 90.2 fL (ref 80.0–100.0)
Platelets: 191 10*3/uL (ref 150–400)
RBC: 4.92 MIL/uL (ref 3.87–5.11)
RDW: 13 % (ref 11.5–15.5)
WBC: 7.9 10*3/uL (ref 4.0–10.5)
nRBC: 0 % (ref 0.0–0.2)

## 2023-08-14 LAB — COMPREHENSIVE METABOLIC PANEL
ALT: 9 U/L (ref 0–44)
AST: 19 U/L (ref 15–41)
Albumin: 3.7 g/dL (ref 3.5–5.0)
Alkaline Phosphatase: 40 U/L (ref 38–126)
Anion gap: 10 (ref 5–15)
BUN: 22 mg/dL — ABNORMAL HIGH (ref 6–20)
CO2: 28 mmol/L (ref 22–32)
Calcium: 9 mg/dL (ref 8.9–10.3)
Chloride: 100 mmol/L (ref 98–111)
Creatinine, Ser: 1.01 mg/dL — ABNORMAL HIGH (ref 0.44–1.00)
GFR, Estimated: >60 mL/min (ref >60)
Glucose, Bld: 276 mg/dL — ABNORMAL HIGH (ref 70–99)
Potassium: 3.5 mmol/L (ref 3.5–5.1)
Sodium: 138 mmol/L (ref 135–145)
Total Bilirubin: 0.6 mg/dL (ref 0.3–1.2)
Total Protein: 6.8 g/dL (ref 6.5–8.1)

## 2023-08-14 LAB — TROPONIN I (HIGH SENSITIVITY)
Troponin I (High Sensitivity): 12 ng/L (ref <18)
Troponin I (High Sensitivity): 12 ng/L (ref <18)
Troponin I (High Sensitivity): 10 ng/L (ref <18)

## 2023-08-14 LAB — GLUCOSE, CAPILLARY
Glucose-Capillary: 156 mg/dL — ABNORMAL HIGH (ref 70–99)
Glucose-Capillary: 167 mg/dL — ABNORMAL HIGH (ref 70–99)
Glucose-Capillary: 152 mg/dL — ABNORMAL HIGH (ref 70–99)
Glucose-Capillary: 167 mg/dL — ABNORMAL HIGH (ref 70–99)

## 2023-08-14 LAB — MAGNESIUM: Magnesium: 2.4 mg/dL (ref 1.7–2.4)

## 2023-08-14 LAB — SEDIMENTATION RATE: Sed Rate: 2 mm/h (ref 0–22)

## 2023-08-14 LAB — LIPASE, BLOOD: Lipase: 49 U/L (ref 11–51)

## 2023-08-14 MED ORDER — ONDANSETRON HCL 4 MG/2ML IJ SOLN
4.0000 mg | Freq: Once | INTRAMUSCULAR | Status: AC
  Administered 2023-08-14: 4 mg via INTRAVENOUS
  Filled 2023-08-14: qty 2

## 2023-08-14 MED ORDER — ATORVASTATIN CALCIUM 80 MG PO TABS
80.0000 mg | ORAL_TABLET | Freq: Every day | ORAL | Status: DC
Start: 2023-08-14 — End: 2023-08-24
  Administered 2023-08-14 – 2023-08-24 (×10): 80 mg via ORAL
  Filled 2023-08-14 (×10): qty 1

## 2023-08-14 MED ORDER — NITROGLYCERIN 0.4 MG SL SUBL
0.4000 mg | SUBLINGUAL_TABLET | Freq: Once | SUBLINGUAL | Status: AC
  Administered 2023-08-14: 0.4 mg via SUBLINGUAL
  Filled 2023-08-14: qty 1

## 2023-08-14 MED ORDER — ACETAMINOPHEN 325 MG PO TABS
650.0000 mg | ORAL_TABLET | Freq: Once | ORAL | Status: AC
  Administered 2023-08-14: 650 mg via ORAL
  Filled 2023-08-14: qty 2

## 2023-08-14 MED ORDER — ACETAMINOPHEN 650 MG RE SUPP
650.0000 mg | Freq: Four times a day (QID) | RECTAL | Status: DC | PRN
Start: 2023-08-14 — End: 2023-08-19

## 2023-08-14 MED ORDER — LISINOPRIL 5 MG PO TABS
5.0000 mg | ORAL_TABLET | Freq: Every day | ORAL | Status: DC
Start: 2023-08-14 — End: 2023-08-17
  Administered 2023-08-14 – 2023-08-17 (×4): 5 mg via ORAL
  Filled 2023-08-14 (×4): qty 1

## 2023-08-14 MED ORDER — METOPROLOL TARTRATE 25 MG PO TABS
25.0000 mg | ORAL_TABLET | Freq: Two times a day (BID) | ORAL | Status: DC
  Administered 2023-08-14 – 2023-08-18 (×8): 25 mg via ORAL
  Filled 2023-08-14 (×10): qty 1

## 2023-08-14 MED ORDER — INSULIN GLARGINE-YFGN 100 UNIT/ML ~~LOC~~ SOLN
30.0000 [IU] | Freq: Every day | SUBCUTANEOUS | Status: DC
  Filled 2023-08-14: qty 0.3

## 2023-08-14 MED ORDER — ASPIRIN 81 MG PO TBEC
81.0000 mg | DELAYED_RELEASE_TABLET | Freq: Every day | ORAL | Status: DC
  Administered 2023-08-14 – 2023-08-18 (×5): 81 mg via ORAL
  Filled 2023-08-14 (×5): qty 1

## 2023-08-14 MED ORDER — HYDROMORPHONE HCL 1 MG/ML IJ SOLN
0.5000 mg | INTRAMUSCULAR | Status: DC | PRN
  Administered 2023-08-14 – 2023-08-19 (×35): 0.5 mg via INTRAVENOUS
  Filled 2023-08-14 (×35): qty 1

## 2023-08-14 MED ORDER — FENTANYL CITRATE PF 50 MCG/ML IJ SOSY
50.0000 ug | PREFILLED_SYRINGE | Freq: Once | INTRAMUSCULAR | Status: AC
  Administered 2023-08-14: 50 ug via INTRAVENOUS
  Filled 2023-08-14: qty 1

## 2023-08-14 MED ORDER — MELATONIN 3 MG PO TABS
3.0000 mg | ORAL_TABLET | Freq: Every evening | ORAL | Status: DC | PRN
  Administered 2023-08-14 – 2023-08-23 (×10): 3 mg via ORAL
  Filled 2023-08-14 (×10): qty 1

## 2023-08-14 MED ORDER — EZETIMIBE 10 MG PO TABS
10.0000 mg | ORAL_TABLET | Freq: Every day | ORAL | Status: DC
  Administered 2023-08-14 – 2023-08-24 (×10): 10 mg via ORAL
  Filled 2023-08-14 (×10): qty 1

## 2023-08-14 MED ORDER — HEPARIN BOLUS VIA INFUSION
4000.0000 [IU] | Freq: Once | INTRAVENOUS | Status: AC
  Administered 2023-08-14: 4000 [IU] via INTRAVENOUS
  Filled 2023-08-14: qty 4000

## 2023-08-14 MED ORDER — HYDROMORPHONE HCL 1 MG/ML IJ SOLN
0.5000 mg | Freq: Once | INTRAMUSCULAR | Status: AC
  Administered 2023-08-14: 0.5 mg via INTRAVENOUS
  Filled 2023-08-14: qty 1

## 2023-08-14 MED ORDER — INSULIN GLARGINE-YFGN 100 UNIT/ML ~~LOC~~ SOLN
60.0000 [IU] | Freq: Every day | SUBCUTANEOUS | Status: DC
  Administered 2023-08-14: 60 [IU] via SUBCUTANEOUS
  Filled 2023-08-14 (×2): qty 0.6

## 2023-08-14 MED ORDER — ISOSORBIDE MONONITRATE ER 30 MG PO TB24
30.0000 mg | ORAL_TABLET | Freq: Every day | ORAL | Status: DC
  Administered 2023-08-14 – 2023-08-18 (×5): 30 mg via ORAL
  Filled 2023-08-14 (×5): qty 1

## 2023-08-14 MED ORDER — INSULIN ASPART 100 UNIT/ML IJ SOLN
0.0000 [IU] | Freq: Three times a day (TID) | INTRAMUSCULAR | Status: DC
  Administered 2023-08-14 (×3): 2 [IU] via SUBCUTANEOUS
  Administered 2023-08-15: 1 [IU] via SUBCUTANEOUS
  Administered 2023-08-15 (×2): 2 [IU] via SUBCUTANEOUS
  Administered 2023-08-16: 1 [IU] via SUBCUTANEOUS
  Administered 2023-08-16 (×2): 2 [IU] via SUBCUTANEOUS
  Administered 2023-08-17: 1 [IU] via SUBCUTANEOUS

## 2023-08-14 MED ORDER — FENOFIBRATE 160 MG PO TABS
160.0000 mg | ORAL_TABLET | Freq: Every day | ORAL | Status: DC
  Administered 2023-08-14 – 2023-08-22 (×8): 160 mg via ORAL
  Filled 2023-08-14 (×9): qty 1

## 2023-08-14 MED ORDER — NALOXONE HCL 0.4 MG/ML IJ SOLN: 0.4000 mg | INTRAMUSCULAR | Status: DC | PRN

## 2023-08-14 MED ORDER — ONDANSETRON HCL 4 MG/2ML IJ SOLN: 4.0000 mg | Freq: Once | INTRAMUSCULAR | Status: DC

## 2023-08-14 MED ORDER — ONDANSETRON HCL 4 MG/2ML IJ SOLN
4.0000 mg | Freq: Four times a day (QID) | INTRAMUSCULAR | Status: DC | PRN
  Administered 2023-08-14 – 2023-08-19 (×15): 4 mg via INTRAVENOUS
  Filled 2023-08-14 (×15): qty 2

## 2023-08-14 MED ORDER — DULOXETINE HCL 30 MG PO CPEP
90.0000 mg | ORAL_CAPSULE | Freq: Every day | ORAL | Status: DC
  Administered 2023-08-14 – 2023-08-24 (×10): 90 mg via ORAL
  Filled 2023-08-14 (×11): qty 3

## 2023-08-14 MED ORDER — HEPARIN (PORCINE) 25000 UT/250ML-% IV SOLN
1000.0000 [IU]/h | INTRAVENOUS | Status: DC
  Administered 2023-08-14: 1000 [IU]/h via INTRAVENOUS
  Filled 2023-08-14: qty 250

## 2023-08-14 MED ORDER — ACETAMINOPHEN 325 MG PO TABS: 650.0000 mg | ORAL_TABLET | Freq: Four times a day (QID) | ORAL | Status: DC | PRN

## 2023-08-14 MED ORDER — POTASSIUM CHLORIDE CRYS ER 20 MEQ PO TBCR
40.0000 meq | EXTENDED_RELEASE_TABLET | Freq: Once | ORAL | Status: AC
Start: 2023-08-14 — End: 2023-08-14
  Administered 2023-08-14: 40 meq via ORAL
  Filled 2023-08-14: qty 2

## 2023-08-14 MED ORDER — TICAGRELOR 90 MG PO TABS
90.0000 mg | ORAL_TABLET | Freq: Two times a day (BID) | ORAL | Status: DC
  Administered 2023-08-14 – 2023-08-16 (×5): 90 mg via ORAL
  Filled 2023-08-14 (×6): qty 1

## 2023-08-14 NOTE — Progress Notes (Signed)
PROGRESS NOTE  Renee Ho  DOB: 02/14/1976  PCP: Melida Quitter, [place] YSA:[id number]  DOA: 08/14/2023  LOS: 0 days  Hospital Day: 1  Brief narrative: Renee Ho is a 47 y.o. female with PMH significant for morbid obesity, OSA, DM2, HTN, HLD, current daily smoking, CAD/NSTEMI/stent, diverticulosis, anxiety/depression.  Patient was recently hospitalized 10/12 to 10/16 with NSTEMI.  Cardiac cath noted multivessel disease and 100% occlusion of left circumflex s/p DES.  The plan was to continue DAPT with aspirin and Brilinta for at least 3 months and then consider CABG because of the mid LAD disease did not seem amenable to PCI.  She was discharged home on aspirin, Brilinta, metoprolol, Imdur and statin. Post discharge, patient felt better but not back to normal.  She had ongoing shortness of breath and exercise intolerance.  11/2, around 11 PM, patient reports that she was watching TV and she had a sudden onset midsternal chest pain that radiated to her jaw.  EMS gave aspirin and nitroglycerin without relief.    In the ED, patient was hemodynamically stable Initial troponin 12.  Admission EKG was unremarkable. Seen by cardiology fellow Kept in observation to [name]  Subjective: Patient was seen and examined this morning.  Middle-aged female.  Lying on bed.  Continues to complain of chest pain.  Has tenderness to touch in the midsternal and right anterior chest wall Chart reviewed Remains hemodynamically stable  Assessment and plan: Chest pain Recent NSTEMI CAD s/p DES to Lcx - 07/23/23 Presented with sudden onset chest pain in the setting of recent stenting. Troponin not elevated EKG without acute ischemic changes. Cardiology follow-up appreciated.  Continue to monitor.  If troponin trends up, may need cardiac cath again. PTA meds aspirin, Brilinta, statin, metoprolol, Imdur Continue all. Recent Labs    08/14/23 0342 08/14/23 0635 08/14/23 0845   TROPONINIHS 12 12 10     Type 2 diabetes mellitus A1c 6.5 on 07/23/2023 PTA meds-Lantus 100 units nightly, metformin 1000 mg twice daily, [name] Currently oral meds on hold.   Currently on Lantus 30 units nightly with SSI/Accu-Cheks.  Increase Lantus to 60 units for tonight. Recent Labs  Lab 08/14/23 0750  GLUCAP 156*   Hypertension PTA meds- metoprolol, Imdur, lisinopril, hydrochlorothiazide Currently continued on metoprolol, Imdur and lisinopril.  HCTZ on hold.  HLD Statin, fenofibrate, Zetia  Morbid Obesity  Body mass index is 41.93 kg/m. Patient has been advised to make an attempt to improve diet and exercise patterns to aid in weight loss.  PCOS S/p hysterectomy and unilateral oophorectomy  OSA Nocturnal CPAP  Anxiety/depression Cymbalta     Mobility: Encourage ambulation.  Goals of care   Code Status: Full Code     DVT prophylaxis:  SCDs Start: 08/14/23 0528   Antimicrobials: None Fluid: None Consultants: Cardiology Family Communication: None at bedside  Status: Observation Level of care:  Telemetry Cardiac   Patient is from: Home Needs to continue in-hospital care: Ongoing cardiac workup Anticipated d/c to: Pending clinical course    Diet:  Diet Order             Diet regular Room service appropriate? Yes; Fluid consistency: Thin  Diet effective now                   Scheduled Meds:  aspirin EC  81 mg Oral Daily   atorvastatin  80 mg Oral Daily   DULoxetine  90 mg Oral Daily   ezetimibe  10 mg Oral Daily  fenofibrate  160 mg Oral Daily   insulin aspart  0-9 Units Subcutaneous TID WC   insulin glargine-yfgn  60 Units Subcutaneous QHS   isosorbide mononitrate  30 mg Oral Daily   lisinopril  5 mg Oral Daily   metoprolol tartrate  25 mg Oral BID   ticagrelor  90 mg Oral BID    PRN meds: acetaminophen **OR** acetaminophen, HYDROmorphone (DILAUDID) injection, melatonin, naLOXone (NARCAN)  injection, ondansetron  (ZOFRAN) IV   Infusions:    Antimicrobials: Anti-infectives (From admission, onward)    None       Objective: Vitals:   08/14/23 0752 08/14/23 0832  BP: 119/67 119/67  Pulse: 67 77  Resp: 16   Temp: 97.9 F (36.6 C)   SpO2: 96%     Intake/Output Summary (Last 24 hours) at 08/14/2023 1212 Last data filed at 08/14/2023 0534 Gross per 24 hour  Intake 50.09 ml  Output --  Net 50.09 ml   Filed Weights   08/14/23 0627  Weight: 114.3 kg   Weight change:  Body mass index is 41.93 kg/m.   Physical Exam: General exam: Pleasant, middle-aged female.  Not in distress Skin: No rashes, lesions or ulcers. HEENT: Atraumatic, normocephalic, no obvious bleeding Lungs: Clear to auscultation bilaterally.  Chest wall tenderness anteriorly on the right and mediastinal area CVS: Regular rate and rhythm, no murmur GI/Abd soft, nontender, nondistended, bowel sound present CNS: Alert, awake, oriented x 3 Psychiatry: Mood appropriate Extremities: No pedal edema, no calf tenderness  Data Review: I have personally reviewed the laboratory data and studies available.  F/u labs ordered Unresulted Labs (From admission, onward)     Start     Ordered   08/15/23 0500  Comprehensive metabolic panel  Tomorrow morning,   R        08/14/23 0528   08/14/23 1022  Sedimentation rate  Once,   R        08/14/23 1021   08/14/23 0500  CBC  Daily,   R      08/14/23 0302            Admission date and time: 08/14/2023  2:14 AM   Total time spent in review of labs and imaging, patient evaluation, formulation of plan, documentation and communication with family: 45 minutes  Signed, Lorin Glass, MD [facility] 08/14/2023

## 2023-08-14 NOTE — ED Provider Notes (Signed)
[facility] [facility] AT [facility] Provider Note   CSN: [id number] Arrival date & time: 08/14/23  0150     History  Chief Complaint  Patient presents with   Chest Pain    Renee Ho is a 47 y.o. female, hx of CAD, HTN, HLD, and DMII, who presents to the ED 2/2 to crushing chest pain, diaphoresis, exertional dyspnea, and nausea since 11PM on 11/2.  She called EMS, because this felt like her last NSTEMI, and was given 324 mg of aspirin, and took 2 nitroglycerin without relief.  She states this nitroglycerin never helps with her chest pain, and that she recently had a stent placed 2 weeks prior.  States she has been having difficulty walking around because she gets so winded.  Home Medications Prior to Admission medications   Medication Sig Start Date End Date Taking? Authorizing Provider  aspirin EC 81 MG tablet Take 1 tablet (81 mg total) by mouth daily. Swallow whole. 07/27/23   Zannie Cove, MD  atorvastatin (LIPITOR) 80 MG tablet Take 1 tablet (80 mg total) by mouth daily. 08/12/23   Sharlene Dory, PA-C  dapagliflozin propanediol (FARXIGA) 10 MG TABS tablet Take 1 tablet (10 mg total) by mouth daily. 08/12/23   Sharlene Dory, PA-C  DULoxetine (CYMBALTA) 30 MG capsule Take 3 capsules (90 mg total) by mouth daily. 03/22/23   Carlean Jews, NP  ezetimibe (ZETIA) 10 MG tablet Take 1 tablet (10 mg total) by mouth daily. 08/12/23   Sharlene Dory, PA-C  fenofibrate 160 MG tablet Take 1 tablet (160 mg total) by mouth daily. 08/12/23   Sharlene Dory, PA-C  hydrochlorothiazide (HYDRODIURIL) 25 MG tablet Take 1 tablet (25 mg total) by mouth daily. 08/12/23   Sharlene Dory, PA-C  insulin glargine-yfgn (SEMGLEE, YFGN,) 100 UNIT/ML Pen Inject 60 Units into the skin at bedtime. 07/27/23   Zannie Cove, MD  Insulin Pen Needle 31G X 8 MM MISC Use as directed Patient not taking: Reported on 08/12/2023 02/24/22   Mayer Masker, PA-C  isosorbide mononitrate  (IMDUR) 30 MG 24 hr tablet Take 1 tablet (30 mg total) by mouth daily. 08/12/23   Sharlene Dory, PA-C  lisinopril (ZESTRIL) 5 MG tablet Take 1 tablet (5 mg total) by mouth daily. 08/12/23   Sharlene Dory, PA-C  metFORMIN (GLUCOPHAGE) 1000 MG tablet Take 1 tablet (1,000 mg total) by mouth 2 (two) times daily with a meal. 03/22/23   Carlean Jews, NP  metoprolol tartrate (LOPRESSOR) 25 MG tablet Take 1 tablet (25 mg total) by mouth 2 (two) times daily. 08/12/23   Sharlene Dory, PA-C  PRESCRIPTION MEDICATION See admin instructions. CPAP- CONTINUOUS    [provider]  ticagrelor (BRILINTA) 90 MG TABS tablet Take 1 tablet (90 mg total) by mouth 2 (two) times daily. 08/12/23   Sharlene Dory, PA-C  tirzepatide [facility]) 7.5 MG/0.5ML Pen Inject 7.5 mg into the skin once a week. 03/22/23   Carlean Jews, NP      Allergies    Morphine and codeine, Reglan [metoclopramide], Chantix [varenicline], and Toradol [ketorolac tromethamine]    Review of Systems   Review of Systems  Respiratory:  Positive for shortness of breath.   Cardiovascular:  Positive for chest pain.    Physical Exam Updated Vital Signs BP 128/61   Pulse 65   Temp 97.9 F (36.6 C) (Oral)   Resp (!) 21   SpO2 99%  Physical Exam Vitals  and nursing note reviewed.  Constitutional:      General: She is not in acute distress.    Appearance: She is well-developed.  HENT:     Head: Normocephalic and atraumatic.  Eyes:     Conjunctiva/sclera: Conjunctivae normal.  Cardiovascular:     Rate and Rhythm: Normal rate and regular rhythm.     Heart sounds: No murmur heard. Pulmonary:     Effort: Pulmonary effort is normal. No respiratory distress.     Breath sounds: Normal breath sounds.  Abdominal:     Palpations: Abdomen is soft.     Tenderness: There is no abdominal tenderness.  Musculoskeletal:        General: No swelling.     Cervical back: Neck supple.  Skin:    General: Skin is warm and dry.     Capillary  Refill: Capillary refill takes less than 2 seconds.  Neurological:     Mental Status: She is alert.  Psychiatric:        Mood and Affect: Mood normal.     ED Results / Procedures / Treatments   Labs (all labs ordered are listed, but only abnormal results are displayed) Labs Reviewed  CBC WITH DIFFERENTIAL/PLATELET - Abnormal; Notable for the following components:      Result Value   HCT 35.7 (*)    All other components within normal limits  COMPREHENSIVE METABOLIC PANEL - Abnormal; Notable for the following components:   Glucose, Bld 276 (*)    BUN 22 (*)    Creatinine, Ser 1.01 (*)    All other components within normal limits  CBC  MAGNESIUM  TROPONIN I (HIGH SENSITIVITY)  TROPONIN I (HIGH SENSITIVITY)    EKG None  Radiology DG Chest 2 View  Result Date: 08/14/2023 CLINICAL DATA:  Chest pain. EXAM: CHEST - 2 VIEW COMPARISON:  CTA chest 07/23/2023. FINDINGS: The heart size and mediastinal contours are within normal limits. Both lungs are clear. The visualized skeletal structures are unremarkable. There is artifact from overlying monitor wiring IMPRESSION: No active cardiopulmonary disease. Electronically Signed   By: Almira Bar M.D.   On: 08/14/2023 03:19    Procedures Procedures    Medications Ordered in ED Medications  acetaminophen (TYLENOL) tablet 650 mg (has no administration in time range)    Or  acetaminophen (TYLENOL) suppository 650 mg (has no administration in time range)  melatonin tablet 3 mg (has no administration in time range)  ondansetron (ZOFRAN) injection 4 mg (has no administration in time range)  naloxone (NARCAN) injection 0.4 mg (has no administration in time range)  HYDROmorphone (DILAUDID) injection 0.5 mg (has no administration in time range)  fentaNYL (SUBLIMAZE) injection 50 mcg (50 mcg Intravenous Given 08/14/23 0246)  nitroGLYCERIN (NITROSTAT) SL tablet 0.4 mg (0.4 mg Sublingual Given 08/14/23 0247)  ondansetron (ZOFRAN) injection 4 mg  (4 mg Intravenous Given 08/14/23 0247)  heparin bolus via infusion 4,000 Units (4,000 Units Intravenous Bolus from Bag 08/14/23 0348)  HYDROmorphone (DILAUDID) injection 0.5 mg (0.5 mg Intravenous Given 08/14/23 0420)  acetaminophen (TYLENOL) tablet 650 mg (650 mg Oral Given 08/14/23 0420)    ED Course/ Medical Decision Making/ A&P                                 Medical Decision Making Patient is a 47 year old female, and history of NSTEMI, who previously came here 2 weeks ago for an NSTEMI, and had a stent placed  in circumflex.  Was unable to stent another region, secondary to possible jailing, of other branches.  States that pain is similar to her past NSTEMI, crushing, in the middle of her chest, with associated with exertion dyspnea, has been going on since her past NSTEMI.  Will obtain troponins, chest x-ray, EKG reviewed nonspecific findings.  Reach out to cardiology due to concern, they state okay to start heparin, if clinically concerned, can stop if Trop negative  Amount and/or Complexity of Data Reviewed Labs: ordered.    Details: Troponin 12 Radiology: ordered.    Details: Chest x-ray clear ECG/medicine tests: ordered. Decision-making details documented in ED Course.    Details: Normal sinus rhythm, minimal ST depression of the lateral leads Discussion of management or test interpretation with external provider(s): Troponin is negative, however spoke with Dr. Evette Doffing, she is agreeable to consultation, AM team will further see.  Admitted to Dr. Arlean Hopping, for further cardiology management, and general management per cards request.  Patient's symptoms are concerning given large cardiac history, risk factors, inability to further stent other lesions, concern for need for CABG  Risk OTC drugs. Prescription drug management. Decision regarding hospitalization.   Final Clinical Impression(s) / ED Diagnoses Final diagnoses:  Chest pain, unspecified type    Rx / DC Orders ED Discharge  Orders     None         Emberley Kral, Harley [place], PA 08/14/23 0617    Melene Plan, DO 08/14/23 [phone]

## 2023-08-14 NOTE — Progress Notes (Addendum)
Progress Note  Patient Name: Renee Ho Date of Encounter: 08/14/2023  Primary Cardiologist: Tessa Lerner, DO   Subjective   Chest pain much improved but still present.   Inpatient Medications    Scheduled Meds:  aspirin EC  81 mg Oral Daily   atorvastatin  80 mg Oral Daily   DULoxetine  90 mg Oral Daily   ezetimibe  10 mg Oral Daily   fenofibrate  160 mg Oral Daily   insulin aspart  0-9 Units Subcutaneous TID WC   insulin glargine-yfgn  30 Units Subcutaneous QHS   isosorbide mononitrate  30 mg Oral Daily   lisinopril  5 mg Oral Daily   metoprolol tartrate  25 mg Oral BID   ticagrelor  90 mg Oral BID   Continuous Infusions:  PRN Meds: acetaminophen **OR** acetaminophen, HYDROmorphone (DILAUDID) injection, melatonin, naLOXone (NARCAN)  injection, ondansetron (ZOFRAN) IV   Vital Signs    Vitals:   08/14/23 0600 08/14/23 0627 08/14/23 0752 08/14/23 0832  BP:  125/61 119/67 119/67  Pulse: 65 68 67 77  Resp: (!) 21 18 16    Temp:  97.9 F (36.6 C) 97.9 F (36.6 C)   TempSrc:  Oral Oral   SpO2: 99% 99% 96%   Weight:  114.3 kg    Height:  5\' 5"  (1.651 m)      Intake/Output Summary (Last 24 hours) at 08/14/2023 1006 Last data filed at 08/14/2023 0534 Gross per 24 hour  Intake 50.09 ml  Output --  Net 50.09 ml   Filed Weights   08/14/23 0627  Weight: 114.3 kg    Telemetry    nsr - Personally Reviewed  ECG    Nsr with nsstt changes. - Personally Reviewed  Physical Exam   GEN: morbidly obese, No acute distress.   Neck: No JVD Cardiac: RRR, no murmurs, rubs, or gallops.  Respiratory: Clear to auscultation bilaterally. GI: Soft, nontender, non-distended  MS: No edema; No deformity. Neuro:  Nonfocal  Psych: Normal affect   Labs    Chemistry Recent Labs  Lab 08/14/23 0342  NA 138  K 3.5  CL 100  CO2 28  GLUCOSE 276*  BUN 22*  CREATININE 1.01*  CALCIUM 9.0  PROT 6.8  ALBUMIN 3.7  AST 19  ALT 9  ALKPHOS 40  BILITOT 0.6   GFRNONAA >60  ANIONGAP 10     Hematology Recent Labs  Lab 08/14/23 0159 08/14/23 0342  WBC 6.7 7.9  RBC 3.87 4.92  HGB 12.2 14.9  HCT 35.7* 44.4  MCV 92.2 90.2  MCH 31.5 30.3  MCHC 34.2 33.6  RDW 13.1 13.0  PLT 155 191    Cardiac EnzymesNo results for input(s): "TROPONINI" in the last 168 hours. No results for input(s): "TROPIPOC" in the last 168 hours.   BNPNo results for input(s): "BNP", "PROBNP" in the last 168 hours.   DDimer No results for input(s): "DDIMER" in the last 168 hours.   Radiology    DG Chest 2 View  Result Date: 08/14/2023 CLINICAL DATA:  Chest pain. EXAM: CHEST - 2 VIEW COMPARISON:  CTA chest 07/23/2023. FINDINGS: The heart size and mediastinal contours are within normal limits. Both lungs are clear. The visualized skeletal structures are unremarkable. There is artifact from overlying monitor wiring IMPRESSION: No active cardiopulmonary disease. Electronically Signed   By: Almira Bar M.D.   On: 08/14/2023 03:19    Cardiac Studies   See above  Patient Profile     47 y.o. female  admitted with [place], negative troponins but symptoms like prior STEMI  Assessment & Plan    [place] - her enzymes initially negative but were also negative initially with STEMI. We will repeat and ECG. If they remain negative, consider nuclear stress test. She has known LAD disease though  it did not look critical to me from last heart cath. We will keep her until tomorrow. If enzymes turn positive, then she will need another left heart cath.     For questions or updates, please contact [facility] Please consult [email or web address] for contact info under Cardiology/STEMI.      Signed, Lewayne Bunting, MD  08/14/2023, 10:06 AM

## 2023-08-14 NOTE — H&P (Signed)
History and Physical      Renee Ho ZHY:[id number] DOB: [birth year] DOA: 08/14/2023; DOS: 08/14/2023  PCP: Melida Quitter, PA  Patient coming from: home   I have personally briefly reviewed patient's old medical records in [facility]  Chief Complaint:   Chest pain  HPI: Renee Ho is a 47 y.o. female with medical history significant for  coronary artery disease status post NSTEMI status post PCI with stent to the left circumflex on 07/23/2023, type 2 diabetes mellitus, hypertension, hyperlipidemia, who is admitted to [facility] on 08/14/2023 with  chest pain after presenting from home to [facility] ED complaining of  chest pain.    the patient was recently hospitalized for NSTEMI during which time her high-sensitivity troponin I appears to have peaked around 6000, with most recent prior high-sensitivity troponin I value noted to be 4000  on 07/25/2023.  during his previous hospitalization, she underwent left-sided coronary angiography on 07/23/2023, at which time she underwent CI with stent placement to the left circumflex.  His coronary angiography was also notable for a lesion within the LAD, that was not amenable to PCI/stent placement.  Rather, this lesion has been managed in the interval via medical management, with the patient.  Good compliance with interval daily baby aspirin as well as Brilinta, high intensity atorvastatin, lisinopril, metoprolol tartrate.  she denies any missed doses in the interval for antiplatelet regimen.    However, in spite of good compliance with her medical management, she presents this evening complaining of 1 day of constant nonradiating substernal chest pressure associated with shortness of breath/dyspnea on exertion, noting that these constellation of symptoms was are very similar in terms of quality, severity, distribution relative to the symptoms she is experiencing at the time of admission for NSTEMI a few weeks ago.   While the chest pain does not worsen with exertion, she notes that the shortness of breath does intensify with exertion it is nonpleuritic, nonpositional, and is not reproducible to palpation over the anterior chest wall.  She has had a total of 3 sublingual nitro at home without any improvement in her associated chest discomfort, although she notes some improvement in the intensity of the chest pain following doses of IV fentanyl and IV Dilaudid in the emergency department.  She reports some associated nausea and the absence of vomiting, will denying any associated palpitations, cysts, dizziness, presyncope, or syncope.  Denies any recent cough, hemoptysis, or new lower extremity erythema or edema.    ED Course:  Vital signs in the ED were notable for the following:   Afebrile; heart rates in the 60s to 80s.  Blood pressures in the 1 teens to 120s; respiratory rate 21 to-22, oxygen saturation 98 % on room air.  Labs were notable for the following:   CMP notable for sodium 130, potassium 3.5, bicarbonate 28, creatinine 1.01 compared to most recent prior serum creatinine data point 0.85 on 102010/20/2024, glucose 2216, liver enzymes within normal limits.  High-sensitivity troponin I noted to be 12, with repeat value currently pending.  CBC notable for will with cell count 6700, hemoglobin 12.2.  Per my interpretation, EKG in ED demonstrated the following:    Sinus rhythm with single PVC, heart rate 79, normal intervals, no evidence of T wave or ST changes, including no evidence of ST elevation.  Imaging in the ED, per corresponding formal radiology read, was notable for the following:    2 view chest x-ray showed no evidence of acute  cardiopulmonary process, including no evidence of endotracheal edema, effusion, or pneumothorax.    EDP discussed patient's case with on-call cardiology fellow, who recommended TRH admission, further trending of troponin, and conveyed that cardiology will formally consult.   Cardiology was amenable to initiation of heparin drip in the setting of the patient's ongoing chest discomfort, with status similarity to the pain in the shoes experiencing at the time of recent hospital station for NSTEMI.   While in the ED, the following were administered:  acetaminophen 650 mg p.o. x 1 dose, fentanyl 50 mcg IV x 1, Dilaudid 0.5 mg IV x 1, sublingual nitroglycerin x 3 doses, Zofran 4 mg IV x 1, heparin bolus followed by initiation of heparin  Drip.  Subsequently, the patient was admitted   for further evaluation management of her presenting chest pain.   Review of Systems: As per HPI otherwise 10 point review of systems negative.   Past Medical History:  Diagnosis Date   Depression    Diabetes mellitus    Diabetes mellitus without complication (HCC)    Diverticula of intestine    High cholesterol    Hypertension    OSA on CPAP    CPAP pressure= 15   Polycystic ovarian syndrome    Sleep apnea    on CPAP with pressure setting= 15    Past Surgical History:  Procedure Laterality Date   ABDOMINAL HYSTERECTOMY     CORONARY STENT INTERVENTION N/A 07/23/2023   Procedure: CORONARY STENT INTERVENTION;  Surgeon: Kathleene Hazel, MD;  Location: [facility] INVASIVE CV LAB;  Service: Cardiovascular;  Laterality: N/A;   CYST REMOVAL NECK     KNEE ARTHROSCOPY  07/11/2012   Procedure: ARTHROSCOPY KNEE;  Surgeon: Cammy Copa, MD;  Location: [facility] OR;  Service: Orthopedics;  Laterality: Right;  Right knee arthroscopy with debridement   KNEE SURGERY     LEFT HEART CATH AND CORONARY ANGIOGRAPHY N/A 07/23/2023   Procedure: LEFT HEART CATH AND CORONARY ANGIOGRAPHY;  Surgeon: Kathleene Hazel, MD;  Location: [facility] INVASIVE CV LAB;  Service: Cardiovascular;  Laterality: N/A;   OVARIAN CYST REMOVAL     WISDOM TOOTH EXTRACTION  05/11/2017    Social History:  reports that she has been smoking cigarettes. She has a 10 pack-year smoking history. She has never used smokeless  tobacco. She reports that she does not drink alcohol and does not use drugs.   Allergies  Allergen Reactions   Morphine And Codeine Other (See Comments)    Migraines   Reglan [Metoclopramide] Other (See Comments)    Psychotic episodes   Chantix [Varenicline] Other (See Comments)    Makes the patient angry feeling   Toradol [Ketorolac Tromethamine] Rash    Family History  Problem Relation Age of Onset   Cancer Mother        breast   Mental illness Mother    Depression Mother    Hyperlipidemia Mother    Breast cancer Mother    Healthy Father    Hyperlipidemia Father    Diabetes Maternal Grandmother    Hyperlipidemia Maternal Grandmother    Cancer Maternal Grandfather        unknown type   Depression Maternal Grandfather    Hyperlipidemia Maternal Grandfather    Stroke Paternal Grandmother    Hyperlipidemia Paternal Grandmother    Hyperlipidemia Paternal Grandfather    Sleep apnea Neg Hx     Family history reviewed and not pertinent    Prior to Admission medications   Medication Sig  Start Date End Date Taking? Authorizing Provider  aspirin EC 81 MG tablet Take 1 tablet (81 mg total) by mouth daily. Swallow whole. 07/27/23   Zannie Cove, MD  atorvastatin (LIPITOR) 80 MG tablet Take 1 tablet (80 mg total) by mouth daily. 08/12/23   Sharlene Dory, PA-C  dapagliflozin propanediol (FARXIGA) 10 MG TABS tablet Take 1 tablet (10 mg total) by mouth daily. 08/12/23   Sharlene Dory, PA-C  DULoxetine (CYMBALTA) 30 MG capsule Take 3 capsules (90 mg total) by mouth daily. 03/22/23   Carlean Jews, NP  ezetimibe (ZETIA) 10 MG tablet Take 1 tablet (10 mg total) by mouth daily. 08/12/23   Sharlene Dory, PA-C  fenofibrate 160 MG tablet Take 1 tablet (160 mg total) by mouth daily. 08/12/23   Sharlene Dory, PA-C  hydrochlorothiazide (HYDRODIURIL) 25 MG tablet Take 1 tablet (25 mg total) by mouth daily. 08/12/23   Sharlene Dory, PA-C  insulin glargine-yfgn (SEMGLEE, YFGN,) 100 UNIT/ML  Pen Inject 60 Units into the skin at bedtime. 07/27/23   Zannie Cove, MD  Insulin Pen Needle 31G X 8 MM MISC Use as directed Patient not taking: Reported on 08/12/2023 02/24/22   Mayer Masker, PA-C  isosorbide mononitrate (IMDUR) 30 MG 24 hr tablet Take 1 tablet (30 mg total) by mouth daily. 08/12/23   Sharlene Dory, PA-C  lisinopril (ZESTRIL) 5 MG tablet Take 1 tablet (5 mg total) by mouth daily. 08/12/23   Sharlene Dory, PA-C  metFORMIN (GLUCOPHAGE) 1000 MG tablet Take 1 tablet (1,000 mg total) by mouth 2 (two) times daily with a meal. 03/22/23   Carlean Jews, NP  metoprolol tartrate (LOPRESSOR) 25 MG tablet Take 1 tablet (25 mg total) by mouth 2 (two) times daily. 08/12/23   Sharlene Dory, PA-C  nicotine (NICODERM CQ - DOSED IN MG/24 HOURS) 21 mg/24hr patch Place 1 patch (21 mg total) onto the skin daily. 07/28/23   Zannie Cove, MD  PRESCRIPTION MEDICATION See admin instructions. CPAP- CONTINUOUS    [provider]  ticagrelor (BRILINTA) 90 MG TABS tablet Take 1 tablet (90 mg total) by mouth 2 (two) times daily. 08/12/23   Sharlene Dory, PA-C  tirzepatide [facility]) 7.5 MG/0.5ML Pen Inject 7.5 mg into the skin once a week. 03/22/23   Carlean Jews, NP     Objective    Physical Exam: Vitals:   08/14/23 0215 08/14/23 0400  BP: (!) 112/58 128/61  Pulse: 86 80  Resp: 11 (!) 22  SpO2: 98% 98%    General: appears to be stated age; alert, oriented Skin: warm, dry, no rash Head:  AT/[place] Mouth:  Oral mucosa membranes appear moist, normal dentition Neck: supple; trachea midline Heart:  RRR; did not appreciate any M/R/G Lungs: CTAB, did not appreciate any wheezes, rales, or rhonchi Abdomen: + BS; soft, ND, NT Vascular: 2+ pedal pulses b/l; 2+ radial pulses b/l Extremities: no peripheral edema, no muscle wasting Neuro: strength and sensation intact in upper and lower extremities b/l     Labs on Admission: I have personally reviewed following labs and imaging  studies  CBC: Recent Labs  Lab 08/14/23 0159 08/14/23 0342  WBC 6.7 7.9  NEUTROABS 3.7  --   HGB 12.2 14.9  HCT 35.7* 44.4  MCV 92.2 90.2  PLT 155 191   Basic Metabolic Panel: Recent Labs  Lab 08/14/23 0342  NA 138  K 3.5  CL 100  CO2 28  GLUCOSE 276*  BUN  22*  CREATININE 1.01*  CALCIUM 9.0   GFR: Estimated Creatinine Clearance: 85.2 mL/min (A) (by C-G formula based on SCr of 1.01 mg/dL (H)). Liver Function Tests: Recent Labs  Lab 08/14/23 0342  AST 19  ALT 9  ALKPHOS 40  BILITOT 0.6  PROT 6.8  ALBUMIN 3.7   No results for input(s): "LIPASE", "AMYLASE" in the last 168 hours. No results for input(s): "AMMONIA" in the last 168 hours. Coagulation Profile: No results for input(s): "INR", "PROTIME" in the last 168 hours. Cardiac Enzymes: No results for input(s): "CKTOTAL", "CKMB", "CKMBINDEX", "TROPONINI" in the last 168 hours. BNP (last 3 results) No results for input(s): "PROBNP" in the last 8760 hours. HbA1C: No results for input(s): "HGBA1C" in the last 72 hours. CBG: No results for input(s): "GLUCAP" in the last 168 hours. Lipid Profile: No results for input(s): "CHOL", "HDL", "LDLCALC", "TRIG", "CHOLHDL", "LDLDIRECT" in the last 72 hours. Thyroid Function Tests: No results for input(s): "TSH", "T4TOTAL", "FREET4", "T3FREE", "THYROIDAB" in the last 72 hours. Anemia Panel: No results for input(s): "VITAMINB12", "FOLATE", "FERRITIN", "TIBC", "IRON", "RETICCTPCT" in the last 72 hours. Urine analysis:    Component Value Date/Time   COLORURINE COLORLESS (A) 06/30/2023 0110   APPEARANCEUR CLEAR 06/30/2023 0110   LABSPEC 1.011 06/30/2023 0110   PHURINE 6.0 06/30/2023 0110   GLUCOSEU >1,000 (A) 06/30/2023 0110   HGBUR NEGATIVE 06/30/2023 0110   BILIRUBINUR NEGATIVE 06/30/2023 0110   BILIRUBINUR negative 03/13/2019 1433   BILIRUBINUR Negative 05/22/2013 1647   KETONESUR NEGATIVE 06/30/2023 0110   PROTEINUR NEGATIVE 06/30/2023 0110   UROBILINOGEN 0.2  03/13/2019 1433   UROBILINOGEN 0.2 03/09/2019 1237   NITRITE NEGATIVE 06/30/2023 0110   LEUKOCYTESUR NEGATIVE 06/30/2023 0110    Radiological Exams on Admission: DG Chest 2 View  Result Date: 08/14/2023 CLINICAL DATA:  Chest pain. EXAM: CHEST - 2 VIEW COMPARISON:  CTA chest 07/23/2023. FINDINGS: The heart size and mediastinal contours are within normal limits. Both lungs are clear. The visualized skeletal structures are unremarkable. There is artifact from overlying monitor wiring IMPRESSION: No active cardiopulmonary disease. Electronically Signed   By: Almira Bar M.D.   On: 08/14/2023 03:19      Assessment/Plan   Principal Problem:   Chest pain Active Problems:   Essential hypertension   DM2 (diabetes mellitus, type 2) (HCC)   Depression   OSA on CPAP   HLD (hyperlipidemia)     #) Chest pain:  1 day of constant substernal chest pressure, without radiation, that has been nonexertional but associated with shortness of breath that does worsen with exertion.  No improvement with nitroglycerin, but demonstrate some improvement with prn IV opioid intervention.   High sensitive troponin I nonelevated, there is being significant improvement from most recent prior value 4000 a few weeks ago at time of NSTEMI utilization for which the patient under went PCI with stent placement to the left circumflex, will also noting that there was a additional lesion in the LAD that was not amenable to PCI/stent placement.  EKG shows no evidence of acute ischemic changes, including no evidence of ST elevation.  unclear if the patient's presenting chest discomfort is as a consequence of the aforementioned LAD lesion for which she has been pursuing medical management.  less likely to be as a consequence of in-stent stenosis given compliance with outpatient management, including abstaining from with interval dual antiplatelet therapy.  the patient is continue to experience active chest discomfort.  Of note,  chest x-ray showed no evidence of acute  cardiopulmonary process, including no evidence of pneumothorax.  Present patient appears less suggestive of acute pulmonary embolism.  EDP discussed patient's case with on-call cardiology fellow, who recommended TRH admission, further trending of troponin, and conveyed that cardiology will formally consult.  Cardiology was amenable to initiation of heparin drip in the setting of the patient's ongoing chest discomfort, with status similarity to the pain in the shoes experiencing at the time of recent hospital station for NSTEMI.   Plan: Monitor on telemetry.  Risk of home dual antiplatelet therapy.  Heparin drip, as above.  Resume home high intensity atorvastatin.  Resume home beta-blocker as well as lisinopril.  Trend troponin.  Cardiology to formally consult.  Will defer to allergy to them whether or not to pursue updated echocardiogram, as she has just undergone echocardiogram a few weeks ago.  Repeat CMP, CBC daily in the morning.  Add on serum magnesium level.  Prn IV Dilaudid.  Check lipase.  Potassium chloride 40 mill equivalents p.o. x 1 dose now.              #) Type 2 Diabetes Mellitus: documented history of such. Home insulin regimen:   Lantus 60 units SQ nightly. Home oral hypoglycemic agents:   metformin, dapagliflozin.  Additionally she is on Mounjaro at home.. presenting blood sugar:    276.  Most recent hemoglobin A1c was found to be 6.5% when checked on 07/23/2023.  in terms of initial dose of basal insulin to be started during this hospitalization, will resume approximately half of outpatient dose in order to reduce risk for ensuing hypoglycemia  Plan: accuchecks QAC and HS with low dose SSI.  Lantus 30 units SQ nightly, as above.  hold home oral hypoglycemic agents during this hospitalization. Will  also hold outpatient Mounjaro during this hospitalization.                   #) Depression: documented h/o such. On  Cymbalta as outpatient.  Of note, presenting EKG shows no evidence of QTc prolongation.  Plan: Continue outpatient Cymbalta.                  #) Essential Hypertension: documented h/o such, with outpatient antihypertensive regimen including lisinopril, Toprol tartrate, Imdur, HCTZ.  SBP's in the ED today: 1 teens to 120s mmHg.   Plan: Close monitoring of subsequent BP via routine VS. continue outpatient beta-blocker, ACE inhibitor, Imdur.  Will hold home HCTZ for now.                   #) Hyperlipidemia: documented h/o such. On high intensity atorvastatin in addition to fenofibrate, Zetia as outpatient.   Plan: continue home statin, Zetia, fenofibrate.                 #) Obstructive sleep apnea: Documented history of such, with patient reporting good compliance on home nocturnal CPAP.   Plan: I have placed respiratory therapy consultation for provision of scheduled nocturnal CPAP.       DVT prophylaxis: SCD's + heparin drip Code Status: Full code Family Communication: none Disposition Plan: Per Rounding Team Consults called: EDP has discussed with on-call cardiology fellow, who conveyed that cardiology will formally consult, as further detailed above;  Admission status: Observation     I SPENT GREATER THAN 75  MINUTES IN CLINICAL CARE TIME/MEDICAL DECISION-MAKING IN COMPLETING THIS ADMISSION.      Chaney Born Isadore Bokhari DO [facility]  From 7PM - 7AM   08/14/2023, 5:29 AM

## 2023-08-14 NOTE — Plan of Care (Signed)
  Problem: Clinical Measurements: Goal: Ability to maintain clinical measurements within normal limits will improve Outcome: Progressing Goal: Diagnostic test results will improve Outcome: Progressing   Problem: Activity: Goal: Risk for activity intolerance will decrease Outcome: Progressing   Problem: Coping: Goal: Level of anxiety will decrease Outcome: Progressing   Problem: Elimination: Goal: Will not experience complications related to urinary retention Outcome: Progressing   Problem: Education: Goal: Knowledge of General Education information will improve Description: Including pain rating scale, medication(s)/side effects and non-pharmacologic comfort measures Outcome: Completed/Met   Problem: Health Behavior/Discharge Planning: Goal: Ability to manage health-related needs will improve Outcome: Completed/Met   Problem: Nutrition: Goal: Adequate nutrition will be maintained Outcome: Completed/Met   Problem: Pain Management: Goal: General experience of comfort will improve Outcome: Completed/Met

## 2023-08-14 NOTE — Consult Note (Signed)
Cardiology Consultation:   Patient ID: Renee Ho MRN: [id number]; DOB: September 02, 1976  Admit date: 08/14/2023 Date of Consult: 08/14/2023  Primary Care Provider: Melida Quitter, PA Primary Cardiologist: Tessa Lerner, DO    Patient Profile:   Renee Ho is a 47 y.o. female with a hx of CAD s/p DES to Lcx, HLD, TIIDM, tobacco abuse, sleep apnea, HTN, anxiety/depression who is being seen today for the evaluation of chest pain at the request of the ER.  History of Present Illness:   Renee Ho hx of CAD s/p DES to Lcx (07/23/23), HLD, TIIDM, tobacco abuse, sleep apnea, HTN, anxiety/depression with recent admission for NSTEMI who presents with SOB, exercise intolerance and chest pain since 11pm last night.  Patient was recently hospitalized at [facility] from 10/12-10/16 with NSTEMI where she was found to have multivessel disease and 100% occlusion of Lcx s/p DES. Per cath report, she had severe stenosis in the heavily calcified mLAD involving 2 diagonal branches and moderate non-obstructive disease in the large dominant RCA. The plan was to continue DAPT (with ASA and Brilianta) for at least 3 months and then consider CABG because mLAD disease did not seem amenable to PCI and a stent would potentially jeopardized flow to the diagonal branches which are moderate to large in caliber. During this admission, LVEF was 60-65^ with moderate asymmetric hypertrophy of septal segment and grade I DD. She was discharged on imdur, aspirin, Brilinta, metoprolol and statin.  Per patient, after hospital discharge, she initially felt better, though not back to normal. She did have ongoing SOB and exercise intolerance. She was seen by cardiology outpatient on 11/1 where she also described ongoing fatigue despite stent placement. 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  During this appointment, the decision was made to consult with CT surgery for potential bypass surgery as an outpatient, but f/u has not been made yet.  Last night at 11pm pt reports that she was watching TV and all of a sudden developed chest pain that radiated to her jaw. She described the pain was 7/10 (not 10/10 like during her NSTEMI). She called EMS and was given ASA 324 mg and 2 NTG without relief. In the ER, patient was noted to be afebrile and normotensive. She was saturating at 96-100 on 2L [place]. Admission labs showed normal CBC, CMP and normal troponin of 12. She was initially placed on heparin but this was stopped when EKG and troponin came back normal. Admission EKG showed NSR with PVC.  Past Medical History:  Diagnosis Date   Depression    Diabetes mellitus    Diabetes mellitus without complication (HCC)    Diverticula of intestine    High cholesterol    Hypertension    OSA on CPAP    CPAP pressure= 15   Polycystic ovarian syndrome    Sleep apnea    on CPAP with pressure setting= 15    Past Surgical History:  Procedure Laterality Date   ABDOMINAL HYSTERECTOMY     CORONARY STENT INTERVENTION N/A 07/23/2023   Procedure: CORONARY STENT INTERVENTION;  Surgeon: Kathleene Hazel, MD;  Location: [facility];  Service: Cardiovascular;  Laterality: N/A;   CYST REMOVAL NECK     KNEE ARTHROSCOPY  07/11/2012   Procedure: ARTHROSCOPY KNEE;  Surgeon: Cammy Copa, MD;  Location: [facility] OR;  Service: Orthopedics;  Laterality: Right;  Right knee arthroscopy with  debridement   KNEE SURGERY     LEFT HEART CATH AND CORONARY ANGIOGRAPHY N/A 07/23/2023   Procedure: LEFT HEART CATH AND CORONARY ANGIOGRAPHY;  Surgeon: Kathleene Hazel, MD;  Location: [facility];  Service: Cardiovascular;  Laterality: N/A;   OVARIAN CYST REMOVAL     WISDOM TOOTH EXTRACTION  05/11/2017     Home Medications:  Prior to Admission medications   Medication Sig Start Date End Date  Taking? Authorizing Provider  aspirin EC 81 MG tablet Take 1 tablet (81 mg total) by mouth daily. Swallow whole. 07/27/23   Zannie Cove, MD  atorvastatin (LIPITOR) 80 MG tablet Take 1 tablet (80 mg total) by mouth daily. 08/12/23   Sharlene Dory, PA-C  dapagliflozin propanediol (FARXIGA) 10 MG TABS tablet Take 1 tablet (10 mg total) by mouth daily. 08/12/23   Sharlene Dory, PA-C  DULoxetine (CYMBALTA) 30 MG capsule Take 3 capsules (90 mg total) by mouth daily. 03/22/23   Carlean Jews, NP  ezetimibe (ZETIA) 10 MG tablet Take 1 tablet (10 mg total) by mouth daily. 08/12/23   Sharlene Dory, PA-C  fenofibrate 160 MG tablet Take 1 tablet (160 mg total) by mouth daily. 08/12/23   Sharlene Dory, PA-C  hydrochlorothiazide (HYDRODIURIL) 25 MG tablet Take 1 tablet (25 mg total) by mouth daily. 08/12/23   Sharlene Dory, PA-C  insulin glargine-yfgn (SEMGLEE, YFGN,) 100 UNIT/ML Pen Inject 60 Units into the skin at bedtime. 07/27/23   Zannie Cove, MD  Insulin Pen Needle 31G X 8 MM MISC Use as directed Patient not taking: Reported on 08/12/2023 02/24/22   Mayer Masker, PA-C  isosorbide mononitrate (IMDUR) 30 MG 24 hr tablet Take 1 tablet (30 mg total) by mouth daily. 08/12/23   Sharlene Dory, PA-C  lisinopril (ZESTRIL) 5 MG tablet Take 1 tablet (5 mg total) by mouth daily. 08/12/23   Sharlene Dory, PA-C  metFORMIN (GLUCOPHAGE) 1000 MG tablet Take 1 tablet (1,000 mg total) by mouth 2 (two) times daily with a meal. 03/22/23   Carlean Jews, NP  metoprolol tartrate (LOPRESSOR) 25 MG tablet Take 1 tablet (25 mg total) by mouth 2 (two) times daily. 08/12/23   Sharlene Dory, PA-C  nicotine (NICODERM CQ - DOSED IN MG/24 HOURS) 21 mg/24hr patch Place 1 patch (21 mg total) onto the skin daily. 07/28/23   Zannie Cove, MD  PRESCRIPTION MEDICATION See admin instructions. CPAP- CONTINUOUS    [provider]  ticagrelor (BRILINTA) 90 MG TABS tablet Take 1 tablet (90 mg total) by mouth 2 (two) times  daily. 08/12/23   Sharlene Dory, PA-C  tirzepatide [facility]) 7.5 MG/0.5ML Pen Inject 7.5 mg into the skin once a week. 03/22/23   Carlean Jews, NP    Inpatient Medications: Scheduled Meds:   Continuous Infusions:  PRN Meds: acetaminophen **OR** acetaminophen, HYDROmorphone (DILAUDID) injection, melatonin, naLOXone (NARCAN)  injection, ondansetron (ZOFRAN) IV  Allergies:    Allergies  Allergen Reactions   Morphine And Codeine Other (See Comments)    Migraines   Reglan [Metoclopramide] Other (See Comments)    Psychotic episodes   Chantix [Varenicline] Other (See Comments)    Makes the patient angry feeling   Toradol [Ketorolac Tromethamine] Rash    Social History:   Social History   Socioeconomic History   Marital status: Married    Spouse name: Not on file   Number of children: 1   Years of education: College   Highest education level:  Not on file  Occupational History   Not on file  Tobacco Use   Smoking status: Every Day    Current packs/day: 0.50    Average packs/day: 0.5 packs/day for 20.0 years (10.0 ttl pk-yrs)    Types: Cigarettes   Smokeless tobacco: Never   Tobacco comments:    stopped smoking on 02/17/17  Vaping Use   Vaping status: Never Used  Substance and Sexual Activity   Alcohol use: No    Alcohol/week: 0.0 standard drinks of alcohol    Comment: Rarely.   Drug use: No   Sexual activity: Yes    Birth control/protection: Surgical  Other Topics Concern   Not on file  Social History Narrative   ** Merged History Encounter **   Drinks diet Dr. Reino Kent about 1 a day.        Social Determinants of Health   Financial Resource Strain: Not on file  Food Insecurity: No Food Insecurity (07/23/2023)   Hunger Vital Sign    Worried About Running Out of Food in the Last Year: Never true    Ran Out of Food in the Last Year: Never true  Transportation Needs: No Transportation Needs (07/23/2023)   PRAPARE - Administrator, Civil Service  (Medical): No    Lack of Transportation (Non-Medical): No  Physical Activity: Not on file  Stress: Not on file  Social Connections: Not on file  Intimate Partner Violence: Not At Risk (07/23/2023)   Humiliation, Afraid, Rape, and Kick questionnaire    Fear of Current or Ex-Partner: No    Emotionally Abused: No    Physically Abused: No    Sexually Abused: No    Family History:    Family History  Problem Relation Age of Onset   Cancer Mother        breast   Mental illness Mother    Depression Mother    Hyperlipidemia Mother    Breast cancer Mother    Healthy Father    Hyperlipidemia Father    Diabetes Maternal Grandmother    Hyperlipidemia Maternal Grandmother    Cancer Maternal Grandfather        unknown type   Depression Maternal Grandfather    Hyperlipidemia Maternal Grandfather    Stroke Paternal Grandmother    Hyperlipidemia Paternal Grandmother    Hyperlipidemia Paternal Grandfather    Sleep apnea Neg Hx      Review of Systems: [y] = yes, [ ]  = no   12 pt Ros neg except what is documented in HPI  Physical Exam/Data:   Vitals:   08/14/23 0215 08/14/23 0400 08/14/23 0533  BP: (!) 112/58 128/61   Pulse: 86 80   Resp: 11 (!) 22   Temp:   97.9 F (36.6 C)  TempSrc:   Oral  SpO2: 98% 98%     Intake/Output Summary (Last 24 hours) at 08/14/2023 0552 Last data filed at 08/14/2023 0534 Gross per 24 hour  Intake 50.09 ml  Output --  Net 50.09 ml   There were no vitals filed for this visit. There is no height or weight on file to calculate BMI.  General:  Well nourished, well developed, in no acute distress HEENT: normal Lymph: no adenopathy Neck: no JVD Endocrine:  No thryomegaly Vascular: No carotid bruits; FA pulses 2+ bilaterally without bruits  Cardiac:  normal S1, S2; RRR; no murmur  Lungs:  clear to auscultation bilaterally, no wheezing, rhonchi or rales  Abd: soft, nontender, no hepatomegaly  Ext:  no edema Musculoskeletal:  No deformities, BUE and  BLE strength normal and equal Skin: warm and dry  Neuro:  CNs 2-12 intact, no focal abnormalities noted Psych:  Normal affect   EKG:  The EKG was personally reviewed and demonstrates:  NSR with PVC  Relevant CV Studies: LHC 07/23/23   Prox Cx to Mid Cx lesion is 100% stenosed.   Mid LAD lesion is 50% stenosed.   Prox LAD to Mid LAD lesion is 70% stenosed.   1st Diag lesion is 50% stenosed.   2nd Diag lesion is 90% stenosed.   Mid LAD to Dist LAD lesion is 50% stenosed.   Mid RCA lesion is 60% stenosed.   Dist RCA lesion is 40% stenosed.   A drug-eluting stent was successfully placed using a SYNERGY XD 3.0X12.   Post intervention, there is a 0% residual stenosis.   NSTEMI secondary to thrombotic occlusion of the proximal Circumflex artery. Collateral filling of the distal Circumflex and OM branches from right to left collaterals. Successful PTCA/DES x 1 proximal Circumflex Severe stenosis in the heavily calcified mid LAD involving two diagonal branches Moderate non-obstructive disease in the large dominant RCA LVEDP 11 mmHg   Recommendations: Admit to the ICU. Aggrastat drip for 2 hours. DAPT with ASA and Brilinta for one year. High intensity statin. Echo tomorrow. Will review the lesions in the LAD with the IC team. I would favor medical management of her LAD disease for now. PCI of the proximal to mid LAD would potentially jeopardize flow into the Diagonal branches which are moderate to large in caliber. Could consider bypass after 3 months of DAPT.   ECHO 07/23/23  1. Left ventricular ejection fraction, by estimation, is 60 to 65%. The  left ventricle has normal function. The left ventricle demonstrates  regional wall motion abnormalities (see scoring diagram/findings for  description). There is moderate asymmetric  left ventricular hypertrophy of the septal segment. Left ventricular  diastolic parameters are consistent with Grade I diastolic dysfunction  (impaired relaxation).    2. Right ventricular systolic function is normal. The right ventricular  size is normal. Tricuspid regurgitation signal is inadequate for assessing  PA pressure.   3. The mitral valve is grossly normal. Trivial mitral valve  regurgitation. No evidence of mitral stenosis.   4. The aortic valve is tricuspid. Aortic valve regurgitation is not  visualized. No aortic stenosis is present.   5. The inferior vena cava is normal in size with greater than 50%  respiratory variability, suggesting right atrial pressure of 3 mmHg.   Laboratory Data:  Chemistry Recent Labs  Lab 08/14/23 0342  NA 138  K 3.5  CL 100  CO2 28  GLUCOSE 276*  BUN 22*  CREATININE 1.01*  CALCIUM 9.0  GFRNONAA >60  ANIONGAP 10    Recent Labs  Lab 08/14/23 0342  PROT 6.8  ALBUMIN 3.7  AST 19  ALT 9  ALKPHOS 40  BILITOT 0.6   Hematology Recent Labs  Lab 08/14/23 0159 08/14/23 0342  WBC 6.7 7.9  RBC 3.87 4.92  HGB 12.2 14.9  HCT 35.7* 44.4  MCV 92.2 90.2  MCH 31.5 30.3  MCHC 34.2 33.6  RDW 13.1 13.0  PLT 155 191   Cardiac EnzymesNo results for input(s): "TROPONINI" in the last 168 hours. No results for input(s): "TROPIPOC" in the last 168 hours.  BNPNo results for input(s): "BNP", "PROBNP" in the last 168 hours.  DDimer No results for input(s): "DDIMER" in the last 168 hours.  Radiology/Studies:  DG Chest 2 View  Result Date: 08/14/2023 CLINICAL DATA:  Chest pain. EXAM: CHEST - 2 VIEW COMPARISON:  CTA chest 07/23/2023. FINDINGS: The heart size and mediastinal contours are within normal limits. Both lungs are clear. The visualized skeletal structures are unremarkable. There is artifact from overlying monitor wiring IMPRESSION: No active cardiopulmonary disease. Electronically Signed   By: Almira Bar M.D.   On: 08/14/2023 03:19    Assessment and Plan:  Renee Ho hx of CAD s/p DES to Lcx (07/23/23), HLD, TIIDM, tobacco abuse, sleep apnea, HTN, anxiety/depression with recent admission  for NSTEMI who presents with SOB, exercise intolerance and chest pain.  #. Chest pain #. Recent NSTEMI  #. CAD s/p DES to Lcx (07/23/23) Pt reports initial improvement since placement of DES to Lcx on 07/23/23. However, at 11pm last night, developed acutely worsening chest pain with radiation to the jaw. On admission, troponin normal at 12 and EKG shows NSR with PVC without ST/T changes. It is unclear what is causing patient's chest pain, but it could be secondary to unrevascularized disease. During NSTEMI admission on 10/12 patient was found to have multivessel disease with tight disease in mLAD that was not amenable to PCI because it would jeopardize flow to the moderate-to-large size diagonal branches. Per documentation, CABG was supposed to be considered after 3 months of DAPT with ASA and Brilinta. Recommendations: - Will discuss with AM team about reaching out to CT surgery to consider CABG earlier - Otherwise, will discuss role for repeat ischemic evaluation (either repeat LHC or NM stress test) - Agree with no heparin at this time given negative troponin and normal EKG - If no other testing is recommended, can titrate up anti-anginal therapy (only on metoprolol 25 and imdur 30 mg) - Continue ticagrelor 90 and ASA 81 mg - Continue atorvastatin 80, zetia - continue lisinopril 5 mg  #. HTN - continue home HCTZ    For questions or updates, please contact [facility] Please consult [email or web address] for contact info under    Signed, Willette Alma, MD  08/14/2023 5:52 AM

## 2023-08-14 NOTE — Progress Notes (Signed)
PHARMACY - ANTICOAGULATION CONSULT NOTE  Pharmacy Consult for IV heparin Indication: chest pain/ACS  Allergies  Allergen Reactions   Morphine And Codeine Other (See Comments)    Migraines   Reglan [Metoclopramide] Other (See Comments)    Psychotic episodes   Chantix [Varenicline] Other (See Comments)    Makes the patient angry feeling   Toradol [Ketorolac Tromethamine] Rash    Patient Measurements: Heparin Dosing Weight: 84.3 kg  Vital Signs: BP: 112/58 (11/03 0215) Pulse Rate: 86 (11/03 0215)  Labs: No results for input(s): "HGB", "HCT", "PLT", "APTT", "LABPROT", "INR", "HEPARINUNFRC", "HEPRLOWMOCWT", "CREATININE", "CKTOTAL", "CKMB", "TROPONINIHS" in the last 72 hours.  Estimated Creatinine Clearance: 101.2 mL/min (by C-G formula based on SCr of 0.85 mg/dL).   Medical History: Past Medical History:  Diagnosis Date   Depression    Diabetes mellitus    Diabetes mellitus without complication (HCC)    Diverticula of intestine    High cholesterol    Hypertension    OSA on CPAP    CPAP pressure= 15   Polycystic ovarian syndrome    Sleep apnea    on CPAP with pressure setting= 15    Assessment: Renee Ho is a 47 y.o. year old female admitted on 08/14/2023 with concern for ACS. No anticoagulation prior to admission. Pharmacy consulted to dose heparin.   Goal of Therapy:  Heparin level 0.3-0.7 units/ml Monitor platelets by anticoagulation protocol: Yes   Plan:  Heparin 4000 units x 1 as bolus followed by heparin infusion at 1000 units/hr 6 heparin level  Daily heparin level, CBC, and monitoring for bleeding F/u plans for anticoagulation and cardiac cath  Thank you for allowing pharmacy to participate in this patient's care.  Marja Kays, PharmD Emergency Medicine Clinical Pharmacist 08/14/2023,2:56 AM

## 2023-08-14 NOTE — ED Triage Notes (Signed)
Pt here from home with c/o chest pain pain 8/10 was a Nstemi back in oct., pt received 324mg  asa and nitroglycerin by ems along with zofran

## 2023-08-14 NOTE — ED Notes (Signed)
ED TO INPATIENT HANDOFF REPORT  ED Nurse Name and Phone #: Opal Dinning [phone]  S Name/Age/Gender [name] 47 y.o. female Room/Bed: 017C/017C  Code Status   Code Status: Full Code  Home/SNF/Other Home Patient oriented to: self, place, time, and situation Is this baseline? Yes   Triage Complete: Triage complete  Chief Complaint Chest pain [R07.9]  Triage Note Pt here from home with c/o chest pain pain 8/10 was a Nstemi back in oct., pt received 324mg  asa and nitroglycerin by ems along with zofran    Allergies Allergies  Allergen Reactions   Morphine And Codeine Other (See Comments)    Migraines   Reglan [Metoclopramide] Other (See Comments)    Psychotic episodes   Chantix [Varenicline] Other (See Comments)    Makes the patient angry feeling   Toradol [Ketorolac Tromethamine] Rash    Level of Care/Admitting Diagnosis ED Disposition     ED Disposition  Admit   Condition  --   Comment  Hospital Area: [facility] [[id number]]  Level of Care: Telemetry Cardiac [[id number]]  May place patient in observation at [facility] or [facility] if equivalent level of care is available:: No  Covid Evaluation: Asymptomatic - no recent exposure (last 10 days) testing not required  Diagnosis: Chest pain [308657]  Admitting Physician: Angie Fava [[id number]]  Attending Physician: Angie Fava [[id number]]          B Medical/Surgery History Past Medical History:  Diagnosis Date   Depression    Diabetes mellitus    Diabetes mellitus without complication (HCC)    Diverticula of intestine    High cholesterol    Hypertension    OSA on CPAP    CPAP pressure= 15   Polycystic ovarian syndrome    Sleep apnea    on CPAP with pressure setting= 15   Past Surgical History:  Procedure Laterality Date   ABDOMINAL HYSTERECTOMY     CORONARY STENT INTERVENTION N/A 07/23/2023   Procedure: CORONARY STENT INTERVENTION;  Surgeon: Kathleene Hazel, MD;  Location: [facility] INVASIVE CV LAB;  Service: Cardiovascular;  Laterality: N/A;   CYST REMOVAL NECK     KNEE ARTHROSCOPY  07/11/2012   Procedure: ARTHROSCOPY KNEE;  Surgeon: Cammy Copa, MD;  Location: [facility] OR;  Service: Orthopedics;  Laterality: Right;  Right knee arthroscopy with debridement   KNEE SURGERY     LEFT HEART CATH AND CORONARY ANGIOGRAPHY N/A 07/23/2023   Procedure: LEFT HEART CATH AND CORONARY ANGIOGRAPHY;  Surgeon: Kathleene Hazel, MD;  Location: [facility] INVASIVE CV LAB;  Service: Cardiovascular;  Laterality: N/A;   OVARIAN CYST REMOVAL     WISDOM TOOTH EXTRACTION  05/11/2017     A IV Location/Drains/Wounds Patient Lines/Drains/Airways Status     Active Line/Drains/Airways     Name Placement date Placement time Site Days   Peripheral IV 08/14/23 20 G 1" Anterior;Proximal;Right Forearm 08/14/23  0255  Forearm  less than 1            Intake/Output Last 24 hours  Intake/Output Summary (Last 24 hours) at 08/14/2023 0534 Last data filed at 08/14/2023 0534 Gross per 24 hour  Intake 50.09 ml  Output --  Net 50.09 ml    Labs/Imaging Results for orders placed or performed during the hospital encounter of 08/14/23 (from the past 48 hour(s))  CBC with Differential     Status: Abnormal   Collection Time: 08/14/23  1:59 AM  Result Value Ref Range   WBC 6.7  4.0 - 10.5 K/uL   RBC 3.87 3.87 - 5.11 MIL/uL   Hemoglobin 12.2 12.0 - 15.0 g/dL   HCT 40.9 (L) 81.1 - 91.4 %   MCV 92.2 80.0 - 100.0 fL   MCH 31.5 26.0 - 34.0 pg   MCHC 34.2 30.0 - 36.0 g/dL   RDW 78.2 95.6 - 21.3 %   Platelets 155 150 - 400 K/uL   nRBC 0.0 0.0 - 0.2 %   Neutrophils Relative % 55 %   Neutro Abs 3.7 1.7 - 7.7 K/uL   Lymphocytes Relative 31 %   Lymphs Abs 2.1 0.7 - 4.0 K/uL   Monocytes Relative 8 %   Monocytes Absolute 0.6 0.1 - 1.0 K/uL   Eosinophils Relative 5 %   Eosinophils Absolute 0.3 0.0 - 0.5 K/uL   Basophils Relative 1 %   Basophils Absolute 0.1 0.0 - 0.1  K/uL   Immature Granulocytes 0 %   Abs Immature Granulocytes 0.03 0.00 - 0.07 K/uL    Comment: Performed at [facility], [address], [place], [place] [address]  CBC     Status: None   Collection Time: 08/14/23  3:42 AM  Result Value Ref Range   WBC 7.9 4.0 - 10.5 K/uL   RBC 4.92 3.87 - 5.11 MIL/uL   Hemoglobin 14.9 12.0 - 15.0 g/dL   HCT 84.6 96.2 - 95.2 %   MCV 90.2 80.0 - 100.0 fL   MCH 30.3 26.0 - 34.0 pg   MCHC 33.6 30.0 - 36.0 g/dL   RDW 84.1 32.4 - 40.1 %   Platelets 191 150 - 400 K/uL   nRBC 0.0 0.0 - 0.2 %    Comment: Performed at [facility], [address] [place], [place] [address]  Comprehensive metabolic panel     Status: Abnormal   Collection Time: 08/14/23  3:42 AM  Result Value Ref Range   Sodium 138 135 - 145 mmol/L   Potassium 3.5 3.5 - 5.1 mmol/L   Chloride 100 98 - 111 mmol/L   CO2 28 22 - 32 mmol/L   Glucose, Bld 276 (H) 70 - 99 mg/dL    Comment: Glucose reference range applies only to samples taken after fasting for at least 8 hours.   BUN 22 (H) 6 - 20 mg/dL   Creatinine, Ser 3.66 (H) 0.44 - 1.00 mg/dL   Calcium 9.0 8.9 - 44.0 mg/dL   Total Protein 6.8 6.5 - 8.1 g/dL   Albumin 3.7 3.5 - 5.0 g/dL   AST 19 15 - 41 U/L   ALT 9 0 - 44 U/L   Alkaline Phosphatase 40 38 - 126 U/L   Total Bilirubin 0.6 0.3 - 1.2 mg/dL   GFR, Estimated >34 >74 mL/min    Comment: (NOTE) Calculated using the CKD-EPI Creatinine Equation (2021)    Anion gap 10 5 - 15    Comment: Performed at [facility], [address] [place], [place] [address]  Troponin I (High Sensitivity)     Status: None   Collection Time: 08/14/23  3:42 AM  Result Value Ref Range   Troponin I (High Sensitivity) 12 <18 ng/L    Comment: (NOTE) Elevated high sensitivity troponin I (hsTnI) values and significant  changes across serial measurements may suggest ACS but many other  chronic and acute conditions are known to elevate hsTnI results.  Refer to the "Links" section for  chest pain algorithms and additional  guidance. Performed at [facility], [address], [place], [place] [address]    DG Chest 2 View  Result Date: 08/14/2023 CLINICAL DATA:  Chest pain. EXAM: CHEST - 2 VIEW COMPARISON:  CTA chest 07/23/2023. FINDINGS: The heart size and mediastinal contours are within normal limits. Both lungs are clear. The visualized skeletal structures are unremarkable. There is artifact from overlying monitor wiring IMPRESSION: No active cardiopulmonary disease. Electronically Signed   By: Almira Bar M.D.   On: 08/14/2023 03:19    Pending Labs Unresulted Labs (From admission, onward)     Start     Ordered   08/15/23 0500  Heparin level (unfractionated)  Daily,   R      08/14/23 0302   08/15/23 0500  Comprehensive metabolic panel  Tomorrow morning,   R        08/14/23 0528   08/14/23 0900  Heparin level (unfractionated)  Once-Timed,   URGENT        08/14/23 0302   08/14/23 0528  Magnesium  Add-on,   AD        08/14/23 0528   08/14/23 0500  CBC  Daily,   R      08/14/23 0302            Vitals/Pain Today's Vitals   08/14/23 0345 08/14/23 0400 08/14/23 0505 08/14/23 0533  BP:  128/61    Pulse:  80    Resp:  (!) 22    Temp:    97.9 F (36.6 C)  TempSrc:    Oral  SpO2:  98%    PainSc: 7   6      Isolation Precautions No active isolations  Medications Medications  heparin ADULT infusion 100 units/mL (25000 units/273mL) (0 Units/hr Intravenous Stopped 08/14/23 0534)  acetaminophen (TYLENOL) tablet 650 mg (has no administration in time range)    Or  acetaminophen (TYLENOL) suppository 650 mg (has no administration in time range)  melatonin tablet 3 mg (has no administration in time range)  ondansetron (ZOFRAN) injection 4 mg (has no administration in time range)  naloxone (NARCAN) injection 0.4 mg (has no administration in time range)  HYDROmorphone (DILAUDID) injection 0.5 mg (has no administration in time range)  fentaNYL (SUBLIMAZE)  injection 50 mcg (50 mcg Intravenous Given 08/14/23 0246)  nitroGLYCERIN (NITROSTAT) SL tablet 0.4 mg (0.4 mg Sublingual Given 08/14/23 0247)  ondansetron (ZOFRAN) injection 4 mg (4 mg Intravenous Given 08/14/23 0247)  heparin bolus via infusion 4,000 Units (4,000 Units Intravenous Bolus from Bag 08/14/23 0348)  HYDROmorphone (DILAUDID) injection 0.5 mg (0.5 mg Intravenous Given 08/14/23 0420)  acetaminophen (TYLENOL) tablet 650 mg (650 mg Oral Given 08/14/23 0420)    Mobility walks     Focused Assessments    R Recommendations: See Admitting Provider Note  Report given to:   Additional Notes:

## 2023-08-15 ENCOUNTER — Telehealth: Payer: Self-pay | Admitting: Cardiology

## 2023-08-15 ENCOUNTER — Observation Stay (HOSPITAL_COMMUNITY): Payer: 59

## 2023-08-15 DIAGNOSIS — R079 Chest pain, unspecified: Secondary | ICD-10-CM | POA: Diagnosis not present

## 2023-08-15 DIAGNOSIS — Z7985 Long-term (current) use of injectable non-insulin antidiabetic drugs: Secondary | ICD-10-CM | POA: Diagnosis not present

## 2023-08-15 DIAGNOSIS — I471 Supraventricular tachycardia, unspecified: Secondary | ICD-10-CM | POA: Diagnosis not present

## 2023-08-15 DIAGNOSIS — R0989 Other specified symptoms and signs involving the circulatory and respiratory systems: Secondary | ICD-10-CM | POA: Diagnosis not present

## 2023-08-15 DIAGNOSIS — E782 Mixed hyperlipidemia: Secondary | ICD-10-CM | POA: Diagnosis not present

## 2023-08-15 DIAGNOSIS — K567 Ileus, unspecified: Secondary | ICD-10-CM | POA: Diagnosis not present

## 2023-08-15 DIAGNOSIS — Z4682 Encounter for fitting and adjustment of non-vascular catheter: Secondary | ICD-10-CM | POA: Diagnosis not present

## 2023-08-15 DIAGNOSIS — R072 Precordial pain: Secondary | ICD-10-CM

## 2023-08-15 DIAGNOSIS — I10 Essential (primary) hypertension: Secondary | ICD-10-CM | POA: Diagnosis not present

## 2023-08-15 DIAGNOSIS — F32A Depression, unspecified: Secondary | ICD-10-CM | POA: Diagnosis not present

## 2023-08-15 DIAGNOSIS — R7989 Other specified abnormal findings of blood chemistry: Secondary | ICD-10-CM | POA: Diagnosis not present

## 2023-08-15 DIAGNOSIS — B370 Candidal stomatitis: Secondary | ICD-10-CM | POA: Diagnosis not present

## 2023-08-15 DIAGNOSIS — D62 Acute posthemorrhagic anemia: Secondary | ICD-10-CM | POA: Diagnosis not present

## 2023-08-15 DIAGNOSIS — E119 Type 2 diabetes mellitus without complications: Secondary | ICD-10-CM | POA: Diagnosis not present

## 2023-08-15 DIAGNOSIS — I209 Angina pectoris, unspecified: Secondary | ICD-10-CM | POA: Diagnosis not present

## 2023-08-15 DIAGNOSIS — I214 Non-ST elevation (NSTEMI) myocardial infarction: Secondary | ICD-10-CM | POA: Diagnosis not present

## 2023-08-15 DIAGNOSIS — Z951 Presence of aortocoronary bypass graft: Secondary | ICD-10-CM | POA: Diagnosis not present

## 2023-08-15 DIAGNOSIS — J939 Pneumothorax, unspecified: Secondary | ICD-10-CM | POA: Diagnosis not present

## 2023-08-15 DIAGNOSIS — E7800 Pure hypercholesterolemia, unspecified: Secondary | ICD-10-CM | POA: Diagnosis present

## 2023-08-15 DIAGNOSIS — I700 Atherosclerosis of aorta: Secondary | ICD-10-CM | POA: Diagnosis not present

## 2023-08-15 DIAGNOSIS — J9811 Atelectasis: Secondary | ICD-10-CM | POA: Diagnosis not present

## 2023-08-15 DIAGNOSIS — Z01810 Encounter for preprocedural cardiovascular examination: Secondary | ICD-10-CM | POA: Diagnosis not present

## 2023-08-15 DIAGNOSIS — R0609 Other forms of dyspnea: Secondary | ICD-10-CM | POA: Diagnosis not present

## 2023-08-15 DIAGNOSIS — I2510 Atherosclerotic heart disease of native coronary artery without angina pectoris: Secondary | ICD-10-CM

## 2023-08-15 DIAGNOSIS — Z6841 Body Mass Index (BMI) 40.0 and over, adult: Secondary | ICD-10-CM | POA: Diagnosis not present

## 2023-08-15 DIAGNOSIS — R5381 Other malaise: Secondary | ICD-10-CM | POA: Diagnosis not present

## 2023-08-15 DIAGNOSIS — Z7982 Long term (current) use of aspirin: Secondary | ICD-10-CM | POA: Diagnosis not present

## 2023-08-15 DIAGNOSIS — E282 Polycystic ovarian syndrome: Secondary | ICD-10-CM | POA: Diagnosis present

## 2023-08-15 DIAGNOSIS — S4432XA Injury of axillary nerve, left arm, initial encounter: Secondary | ICD-10-CM | POA: Diagnosis not present

## 2023-08-15 DIAGNOSIS — R918 Other nonspecific abnormal finding of lung field: Secondary | ICD-10-CM | POA: Diagnosis not present

## 2023-08-15 DIAGNOSIS — I200 Unstable angina: Secondary | ICD-10-CM | POA: Diagnosis not present

## 2023-08-15 DIAGNOSIS — J90 Pleural effusion, not elsewhere classified: Secondary | ICD-10-CM | POA: Diagnosis not present

## 2023-08-15 DIAGNOSIS — I110 Hypertensive heart disease with heart failure: Secondary | ICD-10-CM | POA: Diagnosis not present

## 2023-08-15 DIAGNOSIS — I25110 Atherosclerotic heart disease of native coronary artery with unstable angina pectoris: Secondary | ICD-10-CM | POA: Diagnosis not present

## 2023-08-15 DIAGNOSIS — R531 Weakness: Secondary | ICD-10-CM | POA: Diagnosis not present

## 2023-08-15 DIAGNOSIS — Z885 Allergy status to narcotic agent status: Secondary | ICD-10-CM | POA: Diagnosis not present

## 2023-08-15 DIAGNOSIS — R001 Bradycardia, unspecified: Secondary | ICD-10-CM | POA: Diagnosis present

## 2023-08-15 DIAGNOSIS — I5033 Acute on chronic diastolic (congestive) heart failure: Secondary | ICD-10-CM | POA: Diagnosis not present

## 2023-08-15 DIAGNOSIS — Z7984 Long term (current) use of oral hypoglycemic drugs: Secondary | ICD-10-CM | POA: Diagnosis not present

## 2023-08-15 DIAGNOSIS — I119 Hypertensive heart disease without heart failure: Secondary | ICD-10-CM | POA: Diagnosis not present

## 2023-08-15 DIAGNOSIS — K560 Paralytic ileus: Secondary | ICD-10-CM | POA: Diagnosis not present

## 2023-08-15 DIAGNOSIS — G4733 Obstructive sleep apnea (adult) (pediatric): Secondary | ICD-10-CM | POA: Diagnosis present

## 2023-08-15 DIAGNOSIS — F419 Anxiety disorder, unspecified: Secondary | ICD-10-CM | POA: Diagnosis present

## 2023-08-15 DIAGNOSIS — Z955 Presence of coronary angioplasty implant and graft: Secondary | ICD-10-CM | POA: Diagnosis not present

## 2023-08-15 DIAGNOSIS — Z7902 Long term (current) use of antithrombotics/antiplatelets: Secondary | ICD-10-CM | POA: Diagnosis not present

## 2023-08-15 DIAGNOSIS — E1169 Type 2 diabetes mellitus with other specified complication: Secondary | ICD-10-CM | POA: Diagnosis not present

## 2023-08-15 DIAGNOSIS — K5900 Constipation, unspecified: Secondary | ICD-10-CM | POA: Diagnosis present

## 2023-08-15 DIAGNOSIS — S4400XD Injury of ulnar nerve at upper arm level, unspecified arm, subsequent encounter: Secondary | ICD-10-CM | POA: Diagnosis not present

## 2023-08-15 DIAGNOSIS — Z79899 Other long term (current) drug therapy: Secondary | ICD-10-CM | POA: Diagnosis not present

## 2023-08-15 DIAGNOSIS — Z794 Long term (current) use of insulin: Secondary | ICD-10-CM | POA: Diagnosis not present

## 2023-08-15 DIAGNOSIS — I517 Cardiomegaly: Secondary | ICD-10-CM | POA: Diagnosis not present

## 2023-08-15 DIAGNOSIS — D696 Thrombocytopenia, unspecified: Secondary | ICD-10-CM | POA: Diagnosis not present

## 2023-08-15 LAB — CBC
HCT: 42.9 % (ref 36.0–46.0)
Hemoglobin: 14.1 g/dL (ref 12.0–15.0)
MCH: 30 pg (ref 26.0–34.0)
MCHC: 32.9 g/dL (ref 30.0–36.0)
MCV: 91.3 fL (ref 80.0–100.0)
Platelets: 166 10*3/uL (ref 150–400)
RBC: 4.7 MIL/uL (ref 3.87–5.11)
RDW: 13.1 % (ref 11.5–15.5)
WBC: 8.4 10*3/uL (ref 4.0–10.5)
nRBC: 0 % (ref 0.0–0.2)

## 2023-08-15 LAB — GLUCOSE, CAPILLARY
Glucose-Capillary: 144 mg/dL — ABNORMAL HIGH (ref 70–99)
Glucose-Capillary: 179 mg/dL — ABNORMAL HIGH (ref 70–99)
Glucose-Capillary: 191 mg/dL — ABNORMAL HIGH (ref 70–99)
Glucose-Capillary: 245 mg/dL — ABNORMAL HIGH (ref 70–99)

## 2023-08-15 LAB — COMPREHENSIVE METABOLIC PANEL
ALT: 11 U/L (ref 0–44)
AST: 16 U/L (ref 15–41)
Albumin: 3.4 g/dL — ABNORMAL LOW (ref 3.5–5.0)
Alkaline Phosphatase: 34 U/L — ABNORMAL LOW (ref 38–126)
Anion gap: 7 (ref 5–15)
BUN: 15 mg/dL (ref 6–20)
CO2: 28 mmol/L (ref 22–32)
Calcium: 8.8 mg/dL — ABNORMAL LOW (ref 8.9–10.3)
Chloride: 105 mmol/L (ref 98–111)
Creatinine, Ser: 1 mg/dL (ref 0.44–1.00)
GFR, Estimated: >60 mL/min (ref >60)
Glucose, Bld: 139 mg/dL — ABNORMAL HIGH (ref 70–99)
Potassium: 4.2 mmol/L (ref 3.5–5.1)
Sodium: 140 mmol/L (ref 135–145)
Total Bilirubin: 0.7 mg/dL (ref <1.2)
Total Protein: 6.1 g/dL — ABNORMAL LOW (ref 6.5–8.1)

## 2023-08-15 LAB — BRAIN NATRIURETIC PEPTIDE: B Natriuretic Peptide: 119.4 pg/mL — ABNORMAL HIGH (ref 0.0–100.0)

## 2023-08-15 LAB — C-REACTIVE PROTEIN: CRP: 0.7 mg/dL (ref <1.0)

## 2023-08-15 MED ORDER — FUROSEMIDE 10 MG/ML IJ SOLN
40.0000 mg | Freq: Once | INTRAMUSCULAR | Status: AC
  Administered 2023-08-15: 40 mg via INTRAVENOUS
  Filled 2023-08-15: qty 4

## 2023-08-15 MED ORDER — INSULIN GLARGINE-YFGN 100 UNIT/ML ~~LOC~~ SOLN
75.0000 [IU] | Freq: Every day | SUBCUTANEOUS | Status: DC
  Administered 2023-08-16 – 2023-08-18 (×4): 75 [IU] via SUBCUTANEOUS
  Filled 2023-08-15 (×5): qty 0.75

## 2023-08-15 NOTE — Progress Notes (Signed)
BP 100/54. Pt c/o dizziness. Encouraged PO intake for oral rehydration. Lorin Glass, MD notified. No new orders at this time.

## 2023-08-15 NOTE — Progress Notes (Signed)
   Patient Name: Renee Ho Date of Encounter: 08/15/2023 [facility] Cardiologist: Tessa Lerner, DO   Interval Summary  .    Still with chest tightness this morning, shortness of breath with ambulation to the bathroom. Frustrated with interaction with previous MD, hoping for answers.   Vital Signs .    Vitals:   08/14/23 1700 08/14/23 1954 08/15/23 0406 08/15/23 0410  BP: 108/74 117/62 117/60   Pulse: (!) 47 (!) 55 62   Resp:  18 16   Temp:  98 F (36.7 C) 98 F (36.7 C)   TempSrc:  Oral Oral   SpO2: 97% 94% 96%   Weight:    114.3 kg  Height:        Intake/Output Summary (Last 24 hours) at 08/15/2023 0759 Last data filed at 08/14/2023 2128 Gross per 24 hour  Intake 480 ml  Output --  Net 480 ml      08/15/2023    4:10 AM 08/14/2023    6:27 AM 08/12/2023    2:43 PM  Last 3 Weights  Weight (lbs) 252 lb 251 lb 15.8 oz 253 lb  Weight (kg) 114.306 kg 114.3 kg 114.76 kg      Telemetry/ECG    Sinu Rhythm - Personally Reviewed  Physical Exam .   GEN: No acute distress.   Neck: No JVD Cardiac: RRR, no murmurs, rubs, or gallops.  Respiratory: Clear to auscultation bilaterally. GI: Soft, nontender, non-distended  MS: No edema  Assessment & Plan .     [name] hx of CAD s/p DES to Lcx (07/23/23), HLD, TIIDM, tobacco abuse, sleep apnea, HTN, anxiety/depression with recent admission for NSTEMI who presented with SOB, exercise intolerance and chest pain.   Unstable Angina Recent NSTEMI CAD s/p recent DES to Lcx (07/2023) -- Recently presented with NSTEMI and underwent PCI to circumflex.  Does have residual disease in the LAD with initial plan to treat medically as it was felt PCI of the proximal/mid LAD would potentially jail moderate to large diagonal branch.  She initially felt well after discharge.  On Friday night developed recurrent chest pain with pain into her jaw.  This was similar but not as severe as what she initially experienced  requiring PCI.  She has been compliant with her home medication regimen.  -- hsTn 12>> 12>>10, EKG without acute changes -- Blood pressure is somewhat soft/marginal.  Medical therapy includes metoprolol 5 mg twice daily, lisinopril 5 mg daily, Imdur 30 mg daily.  Suspect she may not be able to tolerate further aggressive titration of medical therapy at this point.  -- Will review with MD regarding repeat cardiac catheterization versus TCTS evaluation -- Continue aspirin, Brilinta, metoprolol 25 mg twice daily, lisinopril 5 mg daily, Imdur 30 mg daily, Lipitor 80 mg daily, Zetia and fenofibrate  HTN -- Controlled -- Continue metoprolol 25 mg twice daily, lisinopril 5 mg daily, Imdur 30 mg daily  HLD -- Continue atorvastatin 80 mg daily, Zetia, fenofibrate  Diabetes -- Hemoglobin A1c 6.5 -- PTA meds-> Lantus, metformin, Farxiga and Mounjaro  Per primary Depression Anxiety OSA Tobacco use- reports she has not smoked since discharge   For questions or updates, please contact [facility] Please consult [email or web address] for contact info under        Signed, Laverda Page, NP

## 2023-08-15 NOTE — Telephone Encounter (Signed)
Pt stated she came in the office on Friday for an office visit with Jari Favre and she was told that her FMLA paper hadn't been received yet. Pt called in today to check on the status of the office receiving her FMLA paperwork from [employer]. Please advise

## 2023-08-15 NOTE — Plan of Care (Signed)
  Problem: Clinical Measurements: Goal: Respiratory complications will improve Outcome: Progressing Goal: Cardiovascular complication will be avoided Outcome: Progressing   Problem: Activity: Goal: Risk for activity intolerance will decrease Outcome: Progressing   Problem: Safety: Goal: Ability to remain free from injury will improve Outcome: Progressing   Problem: Skin Integrity: Goal: Risk for impaired skin integrity will decrease Outcome: Progressing   

## 2023-08-15 NOTE — Progress Notes (Signed)
PROGRESS NOTE  Renee Ho  DOB: 05-Jan-1976  PCP: Renee Ho, Renee Ho:[id number]  DOA: 08/14/2023  LOS: 0 days  Hospital Day: 2  Brief narrative: Renee Ho is a 47 y.o. female with PMH significant for morbid obesity, OSA, DM2, HTN, HLD, current daily smoking, CAD/NSTEMI/stent, diverticulosis, anxiety/depression.  Patient was recently hospitalized 10/12 to 10/16 with NSTEMI.  Cardiac cath noted multivessel disease and 100% occlusion of left circumflex s/p DES.  The plan was to continue DAPT with aspirin and Brilinta for at least 3 months and then consider CABG because of the mid LAD disease did not seem amenable to PCI.  She was discharged home on aspirin, Brilinta, metoprolol, Imdur and statin. Post discharge, patient felt better but not back to normal.  She had ongoing shortness of breath and exercise intolerance.  11/2, around 11 PM, patient reports that she was watching TV and she had a sudden onset midsternal chest pain that radiated to her jaw.  EMS gave aspirin and nitroglycerin without relief.    In the ED, patient was hemodynamically stable Initial troponin 12.  Admission EKG was unremarkable. Seen by cardiology fellow Kept in observation to [facility]  Subjective: Patient was seen and examined this morning.   Sitting up in bed.  Not in distress.   Had shortness of breath on walking to the bathroom earlier. Cardiology following.   Blood sugar remains less than 200 consistently.  Assessment and plan: Chest pain Recent NSTEMI CAD s/p DES to Lcx - 07/23/23 Presented with sudden onset chest pain in the setting of recent stenting. Troponin not elevated EKG without acute ischemic changes. Per cardiology, if troponin trends up, may need cardiac cath again.  Cardiology to follow-up today. PTA meds aspirin, Brilinta, statin, metoprolol, Imdur Continue all. Recent Labs    08/14/23 0342 08/14/23 0635 08/14/23 0845  TROPONINIHS 12 12 10     Type  2 diabetes mellitus A1c 6.5 on 07/23/2023 PTA meds-Lantus 100 units nightly, metformin 1000 mg twice daily, Renee Ho Currently oral meds on hold.   Currently on Lantus 60 units nightly with SSI/Accu-Cheks.  Blood sugar level elevated but consistently less than 200.  Increase Lantus to 75 units for tonight. Recent Labs  Lab 08/14/23 0750 08/14/23 1212 08/14/23 1658 08/14/23 2109 08/15/23 0732  GLUCAP 156* 167* 152* 167* 144*   Hypertension PTA meds- metoprolol, Imdur, lisinopril, hydrochlorothiazide Currently continued on metoprolol, Imdur and lisinopril.  HCTZ on hold. Blood pressure in normal range.  HLD Statin, fenofibrate, Zetia  Morbid Obesity  Body mass index is 41.93 kg/m. Patient has been advised to make an attempt to improve diet and exercise patterns to aid in weight loss.  PCOS S/p hysterectomy and unilateral oophorectomy  OSA Nocturnal CPAP  Anxiety/depression Cymbalta     Mobility: Encourage ambulation.  Goals of care   Code Status: Full Code     DVT prophylaxis:  SCDs Start: 08/14/23 0528   Antimicrobials: None Fluid: None Consultants: Cardiology Family Communication: None at bedside  Status: Observation Level of care:  Telemetry Cardiac   Patient is from: Home Needs to continue in-hospital care: Ongoing cardiac workup Anticipated d/c to: Pending clinical course    Diet:  Diet Order             Diet regular Room service appropriate? Yes; Fluid consistency: Thin  Diet effective now                   Scheduled Meds:  aspirin EC  81 mg  Oral Daily   atorvastatin  80 mg Oral Daily   DULoxetine  90 mg Oral Daily   ezetimibe  10 mg Oral Daily   fenofibrate  160 mg Oral Daily   insulin aspart  0-9 Units Subcutaneous TID WC   insulin glargine-yfgn  75 Units Subcutaneous QHS   isosorbide mononitrate  30 mg Oral Daily   lisinopril  5 mg Oral Daily   metoprolol tartrate  25 mg Oral BID   ticagrelor  90 mg Oral BID    PRN  meds: acetaminophen **OR** acetaminophen, HYDROmorphone (DILAUDID) injection, melatonin, naLOXone (NARCAN)  injection, ondansetron (ZOFRAN) IV   Infusions:    Antimicrobials: Anti-infectives (From admission, onward)    None       Objective: Vitals:   08/15/23 0406 08/15/23 0822  BP: 117/60 (!) 109/53  Pulse: 62 66  Resp: 16 18  Temp: 98 F (36.7 C) 97.9 F (36.6 C)  SpO2: 96% 97%    Intake/Output Summary (Last 24 hours) at 08/15/2023 1045 Last data filed at 08/14/2023 2128 Gross per 24 hour  Intake 480 ml  Output --  Net 480 ml   Filed Weights   08/14/23 0627 08/15/23 0410  Weight: 114.3 kg 114.3 kg   Weight change: 0.007 kg Body mass index is 41.93 kg/m.   Physical Exam: General exam: Pleasant, middle-aged female.  Not in distress Skin: No rashes, lesions or ulcers. HEENT: Atraumatic, normocephalic, no obvious bleeding Lungs: Clear to auscultation bilaterally.  CVS: Regular rate and rhythm, no murmur GI/Abd soft, nontender, nondistended, bowel sound present CNS: Alert, awake, oriented x 3 Psychiatry: Frustrated because of persistent symptoms Extremities: No pedal edema, no calf tenderness  Data Review: I have personally reviewed the laboratory data and studies available.  F/u labs ordered Unresulted Labs (From admission, onward)     Start     Ordered   08/15/23 1022  C-reactive protein  Once,   R        08/15/23 1021   08/15/23 1022  Brain natriuretic peptide  Once,   R        08/15/23 1021   08/14/23 0500  CBC  Daily,   R      08/14/23 0302            Total time spent in review of labs and imaging, patient evaluation, formulation of plan, documentation and communication with family: 45 minutes  Signed, Lorin Glass, MD [facility] 08/15/2023

## 2023-08-16 ENCOUNTER — Inpatient Hospital Stay (HOSPITAL_COMMUNITY): Payer: 59

## 2023-08-16 ENCOUNTER — Telehealth: Payer: Self-pay | Admitting: Cardiology

## 2023-08-16 ENCOUNTER — Other Ambulatory Visit (HOSPITAL_COMMUNITY): Payer: Self-pay

## 2023-08-16 DIAGNOSIS — R7989 Other specified abnormal findings of blood chemistry: Secondary | ICD-10-CM

## 2023-08-16 DIAGNOSIS — R079 Chest pain, unspecified: Secondary | ICD-10-CM | POA: Diagnosis not present

## 2023-08-16 DIAGNOSIS — Z0279 Encounter for issue of other medical certificate: Secondary | ICD-10-CM

## 2023-08-16 DIAGNOSIS — I25110 Atherosclerotic heart disease of native coronary artery with unstable angina pectoris: Secondary | ICD-10-CM | POA: Diagnosis not present

## 2023-08-16 DIAGNOSIS — I214 Non-ST elevation (NSTEMI) myocardial infarction: Secondary | ICD-10-CM | POA: Diagnosis not present

## 2023-08-16 DIAGNOSIS — R0609 Other forms of dyspnea: Secondary | ICD-10-CM

## 2023-08-16 DIAGNOSIS — I2510 Atherosclerotic heart disease of native coronary artery without angina pectoris: Secondary | ICD-10-CM | POA: Diagnosis not present

## 2023-08-16 LAB — CBC
HCT: 43.3 % (ref 36.0–46.0)
Hemoglobin: 14.3 g/dL (ref 12.0–15.0)
MCH: 29.7 pg (ref 26.0–34.0)
MCHC: 33 g/dL (ref 30.0–36.0)
MCV: 89.8 fL (ref 80.0–100.0)
Platelets: 189 10*3/uL (ref 150–400)
RBC: 4.82 MIL/uL (ref 3.87–5.11)
RDW: 12.9 % (ref 11.5–15.5)
WBC: 8.7 10*3/uL (ref 4.0–10.5)
nRBC: 0 % (ref 0.0–0.2)

## 2023-08-16 LAB — GLUCOSE, CAPILLARY
Glucose-Capillary: 126 mg/dL — ABNORMAL HIGH (ref 70–99)
Glucose-Capillary: 164 mg/dL — ABNORMAL HIGH (ref 70–99)
Glucose-Capillary: 172 mg/dL — ABNORMAL HIGH (ref 70–99)
Glucose-Capillary: 227 mg/dL — ABNORMAL HIGH (ref 70–99)

## 2023-08-16 LAB — ECHOCARDIOGRAM LIMITED
Height: 65 in
S' Lateral: 3.1 cm
Weight: 3988.8 [oz_av]

## 2023-08-16 MED ORDER — PERFLUTREN LIPID MICROSPHERE
3.0000 mL | INTRAVENOUS | Status: AC | PRN
  Administered 2023-08-16: 3 mL via INTRAVENOUS

## 2023-08-16 NOTE — Telephone Encounter (Signed)
Received [employer] FMLA form and payment today.  Form taken to Tolia's nurse.

## 2023-08-16 NOTE — Consult Note (Cosign Needed Addendum)
[address]       [place] [address]             [id number]        [facility] Medical Record #[id number] Date of Birth: [birth year]  Referring: No ref. provider found Primary Care: Melida Quitter, PA Primary Cardiologist:Sunit Odis Hollingshead, DO  Chief Complaint:    Chief Complaint  Patient presents with   Chest Pain  Reason for consultation: Coronary artery disease  History of Present Illness:     This is a 47 year old female with a past medical history of NSTEMI (Oct 2024), coronary artery disease, hypertension, diabetes mellitus, OSA, hyperlipidemia, morbid obesity, tobacco abuse (recently quit 07/23/2023), and anxiety and depression who presented to ED with complaints of chest pain to the ED on 08/14/2023. She has also had shortness of breath with chest pain. EMS gave ec asa and 2 Nitroglycerin (did not provide relief). EKG showed no acute ischemic changes. Troponin I (high sensitivity) max was 12 and BNP 119.4.  She was recently hospitalized from 07/23/2023 thru 07/27/2023. She was found to have a NSTEMI and underwent PTCA/DES to proximal Circumflex. Cardiac catheterization done 07/23/2023 showed proximal to mid Circumflex 100% stenosed, proximal to mid LAD 70% stenosed, Diagonal 2 90% stenosed, and mid RCA 60% stenosed.  Of note, mLAD disease did not seem amenable to PCI and a stent would potentially jeopardized flow to the diagonal branches which are moderate to large in caliber. The plan was that she was discharged on ec asa and Brilinta and these would be continued for 3 months. She would then be referred to [facility] to consider coronary artery bypass grafting surgery.  She was seen in the cardiology office on 08/12/2023. Patient has made several lifestyle modifications (stopped smoking, no red meat or fried foods, intentional weight loss etc). She did report increased shortness of breath and exercise intolerance since her heart attack. 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 Her anxiety has also been increased with regards to her probable need for future cardiac surgery. Arrangements were going to made for her to see [facility] but she presented to the ED in the interim.  At the time of my exam, she states she was having chest pain, which she has had since this admission. Echo has been taken but results are not available yet. Of note, echo done 07/23/2023 showed LVEF 60-65%, LV with regional wall motion abnormalities and trivial MR.  Current Activity/ Functional Status: Patient is independent with mobility/ambulation, transfers, ADL's, IADL's.   Zubrod Score: At the time of surgery this patient's most appropriate activity status/level should be described as: []     0    Normal activity, no symptoms []     1    Restricted in physical strenuous activity but ambulatory, able to do out light work [x]     2    Ambulatory and capable of self care, unable to do work activities, up and about more than 50% of the time                            []     3    Only limited self care, in bed greater than 50% of waking hours []     4    Completely disabled, no self care, confined to bed or  chair []     5    Moribund  Past Medical History:  Diagnosis Date   Depression    Diabetes mellitus    Diabetes mellitus without complication (HCC)    Diverticula of intestine    High cholesterol    Hypertension    OSA on CPAP    CPAP pressure= 15   Polycystic ovarian syndrome    Sleep apnea    on CPAP with pressure setting= 15    Past Surgical History:  Procedure Laterality Date   ABDOMINAL HYSTERECTOMY     CORONARY STENT INTERVENTION N/A 07/23/2023   Procedure: CORONARY STENT INTERVENTION;  Surgeon: Kathleene Hazel, MD;  Location: [facility];  Service: Cardiovascular;  Laterality: N/A;   CYST REMOVAL NECK     KNEE ARTHROSCOPY   07/11/2012   Procedure: ARTHROSCOPY KNEE;  Surgeon: Cammy Copa, MD;  Location: [facility] OR;  Service: Orthopedics;  Laterality: Right;  Right knee arthroscopy with debridement   KNEE SURGERY     LEFT HEART CATH AND CORONARY ANGIOGRAPHY N/A 07/23/2023   Procedure: LEFT HEART CATH AND CORONARY ANGIOGRAPHY;  Surgeon: Kathleene Hazel, MD;  Location: [facility];  Service: Cardiovascular;  Laterality: N/A;   OVARIAN CYST REMOVAL     WISDOM TOOTH EXTRACTION  05/11/2017    Social History   Tobacco Use  Smoking Status Every Day   Current packs/day: 0.50   Average packs/day: 0.5 packs/day for 20.0 years (10.0 ttl pk-yrs)   Types: Cigarettes  Smokeless Tobacco Never  Tobacco Comments   stopped smoking on 02/17/17  She quit smoking on last admission after having MI (October 2024)  Social History   Substance and Sexual Activity  Alcohol Use No   Alcohol/week: 0.0 standard drinks of alcohol   Comment: Rarely.     Allergies  Allergen Reactions   Morphine And Codeine Other (See Comments)    Migraines   Reglan [Metoclopramide] Other (See Comments)    Psychotic episodes   Chantix [Varenicline] Other (See Comments)    Makes the patient angry feeling   Toradol [Ketorolac Tromethamine] Rash    Current Facility-Administered Medications  Medication Dose Route Frequency Provider Last Rate Last Admin   acetaminophen (TYLENOL) tablet 650 mg  650 mg Oral Q6H PRN Howerter, Justin B, DO       Or   acetaminophen (TYLENOL) suppository 650 mg  650 mg Rectal Q6H PRN Howerter, Justin B, DO       aspirin EC tablet 81 mg  81 mg Oral Daily Howerter, Justin B, DO   81 mg at 08/16/23 0848   atorvastatin (LIPITOR) tablet 80 mg  80 mg Oral Daily Howerter, Justin B, DO   80 mg at 08/16/23 0848   DULoxetine (CYMBALTA) DR capsule 90 mg  90 mg Oral Daily Howerter, Justin B, DO   90 mg at 08/16/23 0847   ezetimibe (ZETIA) tablet 10 mg  10 mg Oral Daily Howerter, Justin B, DO   10 mg at 08/16/23  0847   fenofibrate tablet 160 mg  160 mg Oral Daily Howerter, Justin B, DO   160 mg at 08/16/23 0854   HYDROmorphone (DILAUDID) injection 0.5 mg  0.5 mg Intravenous Q2H PRN Howerter, Justin B, DO   0.5 mg at 08/16/23 1104   insulin aspart (novoLOG) injection 0-9 Units  0-9 Units Subcutaneous TID WC Howerter, Justin B, DO   1 Units at 08/16/23 0846   insulin glargine-yfgn (SEMGLEE) injection 75 Units  75 Units Subcutaneous  Leonia Corona, MD   75 Units at 08/16/23 0204   isosorbide mononitrate (IMDUR) 24 hr tablet 30 mg  30 mg Oral Daily Howerter, Justin B, DO   30 mg at 08/16/23 0848   lisinopril (ZESTRIL) tablet 5 mg  5 mg Oral Daily Howerter, Justin B, DO   5 mg at 08/16/23 0847   melatonin tablet 3 mg  3 mg Oral QHS PRN Howerter, Justin B, DO   3 mg at 08/15/23 2113   metoprolol tartrate (LOPRESSOR) tablet 25 mg  25 mg Oral BID Howerter, Justin B, DO   25 mg at 08/16/23 0847   naloxone (NARCAN) injection 0.4 mg  0.4 mg Intravenous PRN Howerter, Justin B, DO       ondansetron (ZOFRAN) injection 4 mg  4 mg Intravenous Q6H PRN Howerter, Justin B, DO   4 mg at 08/16/23 0524   perflutren lipid microspheres (DEFINITY) IV suspension  3 mL Intravenous PRN Patwardhan, Manish J, MD   3 mL at 08/16/23 0955   ticagrelor (BRILINTA) tablet 90 mg  90 mg Oral BID Howerter, Justin B, DO   90 mg at 08/16/23 0848    Medications Prior to Admission  Medication Sig Dispense Refill Last Dose   aspirin EC 81 MG tablet Take 1 tablet (81 mg total) by mouth daily. Swallow whole. 30 tablet 0 08/13/2023   atorvastatin (LIPITOR) 80 MG tablet Take 1 tablet (80 mg total) by mouth daily. 90 tablet 3 08/13/2023   dapagliflozin propanediol (FARXIGA) 10 MG TABS tablet Take 1 tablet (10 mg total) by mouth daily. 90 tablet 3 08/13/2023   DULoxetine (CYMBALTA) 30 MG capsule Take 3 capsules (90 mg total) by mouth daily. 270 capsule 1 08/13/2023   ezetimibe (ZETIA) 10 MG tablet Take 1 tablet (10 mg total) by mouth daily. 90 tablet 3  08/13/2023   fenofibrate 160 MG tablet Take 1 tablet (160 mg total) by mouth daily. 90 tablet 3 08/13/2023   hydrochlorothiazide (HYDRODIURIL) 25 MG tablet Take 1 tablet (25 mg total) by mouth daily. 90 tablet 2 08/13/2023   insulin glargine-yfgn (SEMGLEE, YFGN,) 100 UNIT/ML Pen Inject 60 Units into the skin at bedtime. (Patient taking differently: Inject 100 Units into the skin at bedtime.)   08/13/2023   Insulin Pen Needle 31G X 8 MM MISC Use as directed 100 each 3    isosorbide mononitrate (IMDUR) 30 MG 24 hr tablet Take 1 tablet (30 mg total) by mouth daily. 90 tablet 3 08/13/2023   lisinopril (ZESTRIL) 5 MG tablet Take 1 tablet (5 mg total) by mouth daily. 90 tablet 3 08/13/2023   metFORMIN (GLUCOPHAGE) 1000 MG tablet Take 1 tablet (1,000 mg total) by mouth 2 (two) times daily with a meal. 180 tablet 1 08/13/2023   metoprolol tartrate (LOPRESSOR) 25 MG tablet Take 1 tablet (25 mg total) by mouth 2 (two) times daily. 180 tablet 3 08/13/2023 at 2100   ticagrelor (BRILINTA) 90 MG TABS tablet Take 1 tablet (90 mg total) by mouth 2 (two) times daily. (Patient taking differently: Take 90 mg by mouth 2 (two) times daily. Take on Sundays) 180 tablet 3 Past Week at 2100   tirzepatide [facility]) 7.5 MG/0.5ML Pen Inject 7.5 mg into the skin once a week. 6 mL 1 Past Week   PRESCRIPTION MEDICATION See admin instructions. CPAP- CONTINUOUS       Family History  Problem Relation Age of Onset   Cancer Mother        breast   Mental  illness Mother    Depression Mother    Hyperlipidemia Mother    Breast cancer Mother    Healthy Father    Hyperlipidemia Father    Diabetes Maternal Grandmother    Hyperlipidemia Maternal Grandmother    Cancer Maternal Grandfather        unknown type   Depression Maternal Grandfather    Hyperlipidemia Maternal Grandfather    Stroke Paternal Grandmother    Hyperlipidemia Paternal Grandmother    Hyperlipidemia Paternal Grandfather    Sleep apnea Neg Hx   Per patient, both  mother and father are alive at age 30 and neither have a cardiac history  Review of Systems:    Cardiac Review of Systems: Y or  [  Y  ]= no  Chest Pain [  Y  ]  Exertional SOB  [ Y ]     Pedal Edema [ N  ]     Syncope  [ N ]     General Review of Systems: [Y] = yes [N  ]=no Constitional: recent weight change [Y-intentional 30 pound weight loss, on Rosella.Millin  ]; fatigue [ Y ]; nausea Klaus.Mock  ]; fever [  N]; ]                                                               Resp:wheezing[N  ];  hemoptysis[ N ];  GI:  vomiting[N  ];   Heme/Lymph:   anemia[ N ];  Neuro:stroke[ N ];    Psych:depression[Y  ]; anxiety[ Y ];  Endocrine: diabetes[Y  ];  thyroid dysfunction[ N ];              Physical Exam: BP 121/72 (BP Location: Right Arm)   Pulse 62   Temp 97.7 F (36.5 C) (Oral)   Resp 18   Ht 5\' 5"  (1.651 m)   Wt 113.1 kg   SpO2 97%   BMI 41.49 kg/m    General appearance: alert, cooperative, and no distress Head: Normocephalic, without obvious abnormality, atraumatic Neck: no JVD and supple, symmetrical, trachea midline Resp: clear to auscultation bilaterally Cardio: RRR, no murmur GI: Soft, obese, non tender, bowel sounds present Extremities: No LE edema. Palpable DP/PT bilaterally Neurologic: Grossly normal  Diagnostic Studies & Laboratory data:     Recent Radiology Findings:   DG CHEST PORT 1 VIEW  Result Date: 08/15/2023 CLINICAL DATA:  Chest pain, recent heart attack EXAM: PORTABLE CHEST 1 VIEW COMPARISON:  08/14/2023 FINDINGS: Cardiac and mediastinal contours are within normal limits. Mild bibasilar opacities, favored to represent atelectasis. No pleural effusion or pneumothorax. No acute osseous abnormality. IMPRESSION: Mild bibasilar opacities, favored to represent atelectasis. Electronically Signed   By: Wiliam Ke M.D.   On: 08/15/2023 14:37     I have independently reviewed the above radiologic studies and discussed with the patient   Recent Lab Findings: Lab Results   Component Value Date   WBC 8.7 08/16/2023   HGB 14.3 08/16/2023   HCT 43.3 08/16/2023   PLT 189 08/16/2023   GLUCOSE 139 (H) 08/15/2023   CHOL 180 07/23/2023   TRIG 253 (H) 07/23/2023   HDL 28 (L) 07/23/2023   LDLDIRECT 123 (H) 07/23/2023   LDLCALC 101 (H) 07/23/2023   ALT 11 08/15/2023   AST 16  08/15/2023   NA 140 08/15/2023   K 4.2 08/15/2023   CL 105 08/15/2023   CREATININE 1.00 08/15/2023   BUN 15 08/15/2023   CO2 28 08/15/2023   TSH 1.810 02/24/2022   INR 0.9 12/19/2007   HGBA1C 6.5 (H) 07/23/2023   Assessment / Plan:   Coronary artery disease, previous NSTEMI-surgeon to evaluate to determine if candidate (likely is) and timing as has had Brilinta. Last Brilinta dose this am 2. Morbid obesity-She has had intentional weight loss. She is on Monjaro but last dose several days ago 3. History of diabetes mellitus-on Insulin. HGA1C October 6.5. 4. History of OSA 5. History of hypertension-on Lisinopril, Imdur, and Lopressor 6. History of hyperlipidemia-on Zetia, Fenofibrate, and Atorvastatin 7. Tobacco abuse-encouraged continued cessation 8. History of anxiety and depression-on Cymbalta  I  spent 15 minutes counseling the patient face to face.   Doree Fudge PA-C 08/16/2023 11:48 AM   Patient seen and examined, records and cath images reviewed.  47 yo woman with multiple cardiac risk factors including DM, obesity, tobacco abuse and dyslipidemia.  STEMI a month ago treated with DES to circumflex.  Residual significant disease in RCA and LAD.  Presented back with unstable angina.   She had a brief episode of angina this AM.  CABG indicated for survival benefit and relief of symptoms.  I discussed the general nature of the procedure, including the need for general anesthesia, the incisions to be used, the use of cardiopulmonary bypass, and the use of drainage tubes and temporary pacemaker wires postoperatively with Mrs. Nickles-Wright.  We discussed the expected  hospital stay, overall recovery and short and long term outcomes. I informed her of the indications, risks, benefits and alternatives.  She understands the risks include, but are not limited to death, stroke, MI, DVT/PE, bleeding, possible need for transfusion, infections, cardiac arrhythmias, as well as other organ system dysfunction including respiratory, renal, or GI complications.   She accepts the risks and agrees to proceed.   Last dose of Brillinta yesterday AM. Currently on cangrelor IV  Options for surgery are this Friday and next Wednesday.  Given her recurrent pain in house with cangrelor on board, I am not comfortable waiting another week.  Will have a higher risk of bleeding, but feel best option is this week.  She is in agreement.  Salvatore Decent Dorris Fetch, MD [facility] [phone]

## 2023-08-16 NOTE — H&P (View-Only) (Signed)
[address]       [place] [address]             [phone]        [facility] Medical Record #[id number] Date of Birth: 1976/07/24  Referring: No ref. provider found Primary Care: Melida Quitter, PA Primary Cardiologist:Sunit Odis Hollingshead, DO  Chief Complaint:    Chief Complaint  Patient presents with   Chest Pain  Reason for consultation: Coronary artery disease  History of Present Illness:     This is a 47 year old female with a past medical history of NSTEMI (Oct 2024), coronary artery disease, hypertension, diabetes mellitus, OSA, hyperlipidemia, morbid obesity, tobacco abuse (recently quit 07/23/2023), and anxiety and depression who presented to ED with complaints of chest pain to the ED on 08/14/2023. She has also had shortness of breath with chest pain. EMS gave ec asa and 2 Nitroglycerin (did not provide relief). EKG showed no acute ischemic changes. Troponin I (high sensitivity) max was 12 and BNP 119.4.  She was recently hospitalized from 07/23/2023 thru 07/27/2023. She was found to have a NSTEMI and underwent PTCA/DES to proximal Circumflex. Cardiac catheterization done 07/23/2023 showed proximal to mid Circumflex 100% stenosed, proximal to mid LAD 70% stenosed, Diagonal 2 90% stenosed, and mid RCA 60% stenosed.  Of note, mLAD disease did not seem amenable to PCI and a stent would potentially jeopardized flow to the diagonal branches which are moderate to large in caliber. The plan was that she was discharged on ec asa and Brilinta and these would be continued for 3 months. She would then be referred to [facility] to consider coronary artery bypass grafting surgery.  She was seen in the cardiology office on 08/12/2023. Patient has made several lifestyle modifications (stopped smoking, no red meat or fried foods, intentional weight loss etc). She did report increased shortness of breath and exercise intolerance since her heart attack. 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 Her anxiety has also been increased with regards to her probable need for future cardiac surgery. Arrangements were going to made for her to see [facility] but she presented to the ED in the interim.  At the time of my exam, she states she was having chest pain, which she has had since this admission. Echo has been taken but results are not available yet. Of note, echo done 07/23/2023 showed LVEF 60-65%, LV with regional wall motion abnormalities and trivial MR.  Current Activity/ Functional Status: Patient is independent with mobility/ambulation, transfers, ADL's, IADL's.   Zubrod Score: At the time of surgery this patient's most appropriate activity status/level should be described as: []     0    Normal activity, no symptoms []     1    Restricted in physical strenuous activity but ambulatory, able to do out light work [x]     2    Ambulatory and capable of self care, unable to do work activities, up and about more than 50% of the time                            []     3    Only limited self care, in bed greater than 50% of waking hours []     4    Completely disabled, no self care, confined to bed or  chair []     5    Moribund  Past Medical History:  Diagnosis Date   Depression    Diabetes mellitus    Diabetes mellitus without complication (HCC)    Diverticula of intestine    High cholesterol    Hypertension    OSA on CPAP    CPAP pressure= 15   Polycystic ovarian syndrome    Sleep apnea    on CPAP with pressure setting= 15    Past Surgical History:  Procedure Laterality Date   ABDOMINAL HYSTERECTOMY     CORONARY STENT INTERVENTION N/A 07/23/2023   Procedure: CORONARY STENT INTERVENTION;  Surgeon: Kathleene Hazel, MD;  Location: [facility];  Service: Cardiovascular;  Laterality: N/A;   CYST REMOVAL NECK     KNEE ARTHROSCOPY   07/11/2012   Procedure: ARTHROSCOPY KNEE;  Surgeon: Cammy Copa, MD;  Location: [facility] OR;  Service: Orthopedics;  Laterality: Right;  Right knee arthroscopy with debridement   KNEE SURGERY     LEFT HEART CATH AND CORONARY ANGIOGRAPHY N/A 07/23/2023   Procedure: LEFT HEART CATH AND CORONARY ANGIOGRAPHY;  Surgeon: Kathleene Hazel, MD;  Location: [facility];  Service: Cardiovascular;  Laterality: N/A;   OVARIAN CYST REMOVAL     WISDOM TOOTH EXTRACTION  05/11/2017    Social History   Tobacco Use  Smoking Status Every Day   Current packs/day: 0.50   Average packs/day: 0.5 packs/day for 20.0 years (10.0 ttl pk-yrs)   Types: Cigarettes  Smokeless Tobacco Never  Tobacco Comments   stopped smoking on 02/17/17  She quit smoking on last admission after having MI (October 2024)  Social History   Substance and Sexual Activity  Alcohol Use No   Alcohol/week: 0.0 standard drinks of alcohol   Comment: Rarely.     Allergies  Allergen Reactions   Morphine And Codeine Other (See Comments)    Migraines   Reglan [Metoclopramide] Other (See Comments)    Psychotic episodes   Chantix [Varenicline] Other (See Comments)    Makes the patient angry feeling   Toradol [Ketorolac Tromethamine] Rash    Current Facility-Administered Medications  Medication Dose Route Frequency Provider Last Rate Last Admin   acetaminophen (TYLENOL) tablet 650 mg  650 mg Oral Q6H PRN Howerter, Justin B, DO       Or   acetaminophen (TYLENOL) suppository 650 mg  650 mg Rectal Q6H PRN Howerter, Justin B, DO       aspirin EC tablet 81 mg  81 mg Oral Daily Howerter, Justin B, DO   81 mg at 08/16/23 0848   atorvastatin (LIPITOR) tablet 80 mg  80 mg Oral Daily Howerter, Justin B, DO   80 mg at 08/16/23 0848   DULoxetine (CYMBALTA) DR capsule 90 mg  90 mg Oral Daily Howerter, Justin B, DO   90 mg at 08/16/23 0847   ezetimibe (ZETIA) tablet 10 mg  10 mg Oral Daily Howerter, Justin B, DO   10 mg at 08/16/23  0847   fenofibrate tablet 160 mg  160 mg Oral Daily Howerter, Justin B, DO   160 mg at 08/16/23 0854   HYDROmorphone (DILAUDID) injection 0.5 mg  0.5 mg Intravenous Q2H PRN Howerter, Justin B, DO   0.5 mg at 08/16/23 1104   insulin aspart (novoLOG) injection 0-9 Units  0-9 Units Subcutaneous TID WC Howerter, Justin B, DO   1 Units at 08/16/23 0846   insulin glargine-yfgn (SEMGLEE) injection 75 Units  75 Units Subcutaneous  Leonia Corona, MD   75 Units at 08/16/23 0204   isosorbide mononitrate (IMDUR) 24 hr tablet 30 mg  30 mg Oral Daily Howerter, Justin B, DO   30 mg at 08/16/23 0848   lisinopril (ZESTRIL) tablet 5 mg  5 mg Oral Daily Howerter, Justin B, DO   5 mg at 08/16/23 0847   melatonin tablet 3 mg  3 mg Oral QHS PRN Howerter, Justin B, DO   3 mg at 08/15/23 2113   metoprolol tartrate (LOPRESSOR) tablet 25 mg  25 mg Oral BID Howerter, Justin B, DO   25 mg at 08/16/23 0847   naloxone (NARCAN) injection 0.4 mg  0.4 mg Intravenous PRN Howerter, Justin B, DO       ondansetron (ZOFRAN) injection 4 mg  4 mg Intravenous Q6H PRN Howerter, Justin B, DO   4 mg at 08/16/23 0524   perflutren lipid microspheres (DEFINITY) IV suspension  3 mL Intravenous PRN Patwardhan, Manish J, MD   3 mL at 08/16/23 0955   ticagrelor (BRILINTA) tablet 90 mg  90 mg Oral BID Howerter, Justin B, DO   90 mg at 08/16/23 0848    Medications Prior to Admission  Medication Sig Dispense Refill Last Dose   aspirin EC 81 MG tablet Take 1 tablet (81 mg total) by mouth daily. Swallow whole. 30 tablet 0 08/13/2023   atorvastatin (LIPITOR) 80 MG tablet Take 1 tablet (80 mg total) by mouth daily. 90 tablet 3 08/13/2023   dapagliflozin propanediol (FARXIGA) 10 MG TABS tablet Take 1 tablet (10 mg total) by mouth daily. 90 tablet 3 08/13/2023   DULoxetine (CYMBALTA) 30 MG capsule Take 3 capsules (90 mg total) by mouth daily. 270 capsule 1 08/13/2023   ezetimibe (ZETIA) 10 MG tablet Take 1 tablet (10 mg total) by mouth daily. 90 tablet 3  08/13/2023   fenofibrate 160 MG tablet Take 1 tablet (160 mg total) by mouth daily. 90 tablet 3 08/13/2023   hydrochlorothiazide (HYDRODIURIL) 25 MG tablet Take 1 tablet (25 mg total) by mouth daily. 90 tablet 2 08/13/2023   insulin glargine-yfgn (SEMGLEE, YFGN,) 100 UNIT/ML Pen Inject 60 Units into the skin at bedtime. (Patient taking differently: Inject 100 Units into the skin at bedtime.)   08/13/2023   Insulin Pen Needle 31G X 8 MM MISC Use as directed 100 each 3    isosorbide mononitrate (IMDUR) 30 MG 24 hr tablet Take 1 tablet (30 mg total) by mouth daily. 90 tablet 3 08/13/2023   lisinopril (ZESTRIL) 5 MG tablet Take 1 tablet (5 mg total) by mouth daily. 90 tablet 3 08/13/2023   metFORMIN (GLUCOPHAGE) 1000 MG tablet Take 1 tablet (1,000 mg total) by mouth 2 (two) times daily with a meal. 180 tablet 1 08/13/2023   metoprolol tartrate (LOPRESSOR) 25 MG tablet Take 1 tablet (25 mg total) by mouth 2 (two) times daily. 180 tablet 3 08/13/2023 at 2100   ticagrelor (BRILINTA) 90 MG TABS tablet Take 1 tablet (90 mg total) by mouth 2 (two) times daily. (Patient taking differently: Take 90 mg by mouth 2 (two) times daily. Take on Sundays) 180 tablet 3 Past Week at 2100   tirzepatide [facility]) 7.5 MG/0.5ML Pen Inject 7.5 mg into the skin once a week. 6 mL 1 Past Week   PRESCRIPTION MEDICATION See admin instructions. CPAP- CONTINUOUS       Family History  Problem Relation Age of Onset   Cancer Mother        breast   Mental  illness Mother    Depression Mother    Hyperlipidemia Mother    Breast cancer Mother    Healthy Father    Hyperlipidemia Father    Diabetes Maternal Grandmother    Hyperlipidemia Maternal Grandmother    Cancer Maternal Grandfather        unknown type   Depression Maternal Grandfather    Hyperlipidemia Maternal Grandfather    Stroke Paternal Grandmother    Hyperlipidemia Paternal Grandmother    Hyperlipidemia Paternal Grandfather    Sleep apnea Neg Hx   Per patient, both  mother and father are alive at age 68 and neither have a cardiac history  Review of Systems:    Cardiac Review of Systems: Y or  [  Y  ]= no  Chest Pain [  Y  ]  Exertional SOB  [ Y ]     Pedal Edema [ N  ]     Syncope  [ N ]     General Review of Systems: [Y] = yes [N  ]=no Constitional: recent weight change [Y-intentional 30 pound weight loss, on Rosella.Millin  ]; fatigue [ Y ]; nausea Klaus.Mock  ]; fever [  N]; ]                                                               Resp:wheezing[N  ];  hemoptysis[ N ];  GI:  vomiting[N  ];   Heme/Lymph:   anemia[ N ];  Neuro:stroke[ N ];    Psych:depression[Y  ]; anxiety[ Y ];  Endocrine: diabetes[Y  ];  thyroid dysfunction[ N ];              Physical Exam: BP 121/72 (BP Location: Right Arm)   Pulse 62   Temp 97.7 F (36.5 C) (Oral)   Resp 18   Ht 5\' 5"  (1.651 m)   Wt 113.1 kg   SpO2 97%   BMI 41.49 kg/m    General appearance: alert, cooperative, and no distress Head: Normocephalic, without obvious abnormality, atraumatic Neck: no JVD and supple, symmetrical, trachea midline Resp: clear to auscultation bilaterally Cardio: RRR, no murmur GI: Soft, obese, non tender, bowel sounds present Extremities: No LE edema. Palpable DP/PT bilaterally Neurologic: Grossly normal  Diagnostic Studies & Laboratory data:     Recent Radiology Findings:   DG CHEST PORT 1 VIEW  Result Date: 08/15/2023 CLINICAL DATA:  Chest pain, recent heart attack EXAM: PORTABLE CHEST 1 VIEW COMPARISON:  08/14/2023 FINDINGS: Cardiac and mediastinal contours are within normal limits. Mild bibasilar opacities, favored to represent atelectasis. No pleural effusion or pneumothorax. No acute osseous abnormality. IMPRESSION: Mild bibasilar opacities, favored to represent atelectasis. Electronically Signed   By: Wiliam Ke M.D.   On: 08/15/2023 14:37     I have independently reviewed the above radiologic studies and discussed with the patient   Recent Lab Findings: Lab Results   Component Value Date   WBC 8.7 08/16/2023   HGB 14.3 08/16/2023   HCT 43.3 08/16/2023   PLT 189 08/16/2023   GLUCOSE 139 (H) 08/15/2023   CHOL 180 07/23/2023   TRIG 253 (H) 07/23/2023   HDL 28 (L) 07/23/2023   LDLDIRECT 123 (H) 07/23/2023   LDLCALC 101 (H) 07/23/2023   ALT 11 08/15/2023   AST 16  08/15/2023   NA 140 08/15/2023   K 4.2 08/15/2023   CL 105 08/15/2023   CREATININE 1.00 08/15/2023   BUN 15 08/15/2023   CO2 28 08/15/2023   TSH 1.810 02/24/2022   INR 0.9 12/19/2007   HGBA1C 6.5 (H) 07/23/2023   Assessment / Plan:   Coronary artery disease, previous NSTEMI-surgeon to evaluate to determine if candidate (likely is) and timing as has had Brilinta. Last Brilinta dose this am 2. Morbid obesity-She has had intentional weight loss. She is on Monjaro but last dose several days ago 3. History of diabetes mellitus-on Insulin. HGA1C October 6.5. 4. History of OSA 5. History of hypertension-on Lisinopril, Imdur, and Lopressor 6. History of hyperlipidemia-on Zetia, Fenofibrate, and Atorvastatin 7. Tobacco abuse-encouraged continued cessation 8. History of anxiety and depression-on Cymbalta  I  spent 15 minutes counseling the patient face to face.   Doree Fudge PA-C 08/16/2023 11:48 AM   Patient seen and examined, records and cath images reviewed.  47 yo woman with multiple cardiac risk factors including DM, obesity, tobacco abuse and dyslipidemia.  STEMI a month ago treated with DES to circumflex.  Residual significant disease in RCA and LAD.  Presented back with unstable angina.   She had a brief episode of angina this AM.  CABG indicated for survival benefit and relief of symptoms.  I discussed the general nature of the procedure, including the need for general anesthesia, the incisions to be used, the use of cardiopulmonary bypass, and the use of drainage tubes and temporary pacemaker wires postoperatively with Mrs. Nickles-Wright.  We discussed the expected  hospital stay, overall recovery and short and long term outcomes. I informed her of the indications, risks, benefits and alternatives.  She understands the risks include, but are not limited to death, stroke, MI, DVT/PE, bleeding, possible need for transfusion, infections, cardiac arrhythmias, as well as other organ system dysfunction including respiratory, renal, or GI complications.   She accepts the risks and agrees to proceed.   Last dose of Brillinta yesterday AM. Currently on cangrelor IV  Options for surgery are this Friday and next Wednesday.  Given her recurrent pain in house with cangrelor on board, I am not comfortable waiting another week.  Will have a higher risk of bleeding, but feel best option is this week.  She is in agreement.  Salvatore Decent Dorris Fetch, MD [facility] [phone]

## 2023-08-16 NOTE — Telephone Encounter (Signed)
I have forwarded them to Julian Hy - they spoke about it at the last OV on 08/12/2023.   Merita Hawks Brownlee, DO, [facility]

## 2023-08-16 NOTE — Progress Notes (Signed)
PROGRESS NOTE  Renee Ho  DOB: 29-May-1976  PCP: Melida Quitter, [place] ACZ:[id number]  DOA: 08/14/2023  LOS: 1 day  Hospital Day: 3  Brief narrative: Renee Ho is a 47 y.o. female with PMH significant for morbid obesity, OSA, DM2, HTN, HLD, current daily smoking, CAD/NSTEMI/stent, diverticulosis, anxiety/depression.  Patient was recently hospitalized 10/12 to 10/16 with NSTEMI.  Cardiac cath noted multivessel disease and 100% occlusion of left circumflex s/p DES.  The plan was to continue DAPT with aspirin and Brilinta for at least 3 months and then consider CABG because of the mid LAD disease did not seem amenable to PCI.  She was discharged home on aspirin, Brilinta, metoprolol, Imdur and statin. Post discharge, patient felt better but not back to normal.  She had ongoing shortness of breath and exercise intolerance.  11/2, around 11 PM, patient reports that she was watching TV and she had a sudden onset midsternal chest pain that radiated to her jaw.  EMS gave aspirin and nitroglycerin without relief.    In the ED, patient was hemodynamically stable Initial troponin 12.  Admission EKG was unremarkable. Admitted to [facility] Cardiology consulted. See below for details  Subjective: Patient was seen and examined this morning.   Sitting up in bed.  Not in distress.   Continues to have ongoing chest pain and shortness of breath.  Yesterday, she walked in the hallway 3 different times and felt symptoms. Blood sugar level controlled.  Assessment and plan: Chest pain Recent NSTEMI CAD s/p DES to Lcx - 07/23/23 Presented with sudden onset chest pain in the setting of recent stenting. Troponin not elevated EKG without acute ischemic changes. Cardiology following. Continues to have ongoing chest pain and shortness of breath. Sed rate and CRP normal. Has reproducible tenderness on touch.  Denies any trauma or reflux symptoms h/o esophagitis or ulcers. Pending  limited echo.  Further workup per cardiology. PTA meds aspirin, Brilinta, statin, metoprolol, Imdur Currently continued on all Recent Labs    08/14/23 0342 08/14/23 0635 08/14/23 0845  TROPONINIHS 12 12 10     Type 2 diabetes mellitus A1c 6.5 on 07/23/2023 PTA meds-Lantus 100 units nightly, metformin 1000 mg twice daily, [name] Currently oral meds on hold.   Currently on Lantus 75 units nightly with SSI/Accu-Cheks.  Blood sugar level elevated but gradually improving.  Continue same for now. Recent Labs  Lab 08/15/23 0732 08/15/23 1130 08/15/23 1547 08/15/23 2059 08/16/23 0825  GLUCAP 144* 179* 191* 245* 126*   Hypertension PTA meds- metoprolol, Imdur, lisinopril, hydrochlorothiazide Currently continued on metoprolol, Imdur and lisinopril.  HCTZ on hold. Blood pressure in normal range.  HLD Statin, fenofibrate, Zetia  Morbid Obesity  Body mass index is 41.49 kg/m. Patient has been advised to make an attempt to improve diet and exercise patterns to aid in weight loss.  PCOS S/p hysterectomy and unilateral oophorectomy  OSA Nocturnal CPAP  Anxiety/depression Cymbalta     Mobility: Encourage ambulation.  Goals of care   Code Status: Full Code     DVT prophylaxis:  SCDs Start: 08/14/23 0528   Antimicrobials: None Fluid: None Consultants: Cardiology Family Communication: None at bedside  Status: Observation Level of care:  Telemetry Cardiac   Patient is from: Home Needs to continue in-hospital care: Ongoing cardiac workup Anticipated d/c to: Pending clinical course    Diet:  Diet Order             Diet regular Room service appropriate? Yes; Fluid consistency: Thin  Diet  effective now                   Scheduled Meds:  aspirin EC  81 mg Oral Daily   atorvastatin  80 mg Oral Daily   DULoxetine  90 mg Oral Daily   ezetimibe  10 mg Oral Daily   fenofibrate  160 mg Oral Daily   insulin aspart  0-9 Units Subcutaneous TID WC    insulin glargine-yfgn  75 Units Subcutaneous QHS   isosorbide mononitrate  30 mg Oral Daily   lisinopril  5 mg Oral Daily   metoprolol tartrate  25 mg Oral BID   ticagrelor  90 mg Oral BID    PRN meds: acetaminophen **OR** acetaminophen, HYDROmorphone (DILAUDID) injection, melatonin, naLOXone (NARCAN)  injection, ondansetron (ZOFRAN) IV, perflutren lipid microspheres (DEFINITY) IV suspension   Infusions:    Antimicrobials: Anti-infectives (From admission, onward)    None       Objective: Vitals:   08/16/23 0154 08/16/23 0452  BP: (!) 110/56 121/72  Pulse: (!) 58 62  Resp:  18  Temp:  97.7 F (36.5 C)  SpO2: 95% 97%    Intake/Output Summary (Last 24 hours) at 08/16/2023 1010 Last data filed at 08/16/2023 0140 Gross per 24 hour  Intake 360 ml  Output 1500 ml  Net -1140 ml   Filed Weights   08/14/23 0627 08/15/23 0410 08/16/23 0452  Weight: 114.3 kg 114.3 kg 113.1 kg   Weight change: -1.225 kg Body mass index is 41.49 kg/m.   Physical Exam: General exam: Pleasant, middle-aged female.  Not in distress Skin: No rashes, lesions or ulcers. HEENT: Atraumatic, normocephalic, no obvious bleeding Lungs: Clear to auscultation bilaterally.  Also has midsternal tenderness to touch CVS: Regular rate and rhythm, no murmur GI/Abd soft, nontender, nondistended, bowel sound present CNS: Alert, awake, oriented x 3 Psychiatry: Frustrated because of persistent symptoms Extremities: No pedal edema, no calf tenderness  Data Review: I have personally reviewed the laboratory data and studies available.  F/u labs ordered Unresulted Labs (From admission, onward)     Start     Ordered   08/14/23 0500  CBC  Daily,   R      08/14/23 0302            Total time spent in review of labs and imaging, patient evaluation, formulation of plan, documentation and communication with family: 45 minutes  Signed, Lorin Glass, MD [facility] 08/16/2023

## 2023-08-16 NOTE — Progress Notes (Signed)
Patient Name: Renee Ho Date of Encounter: 08/16/2023 [facility] Cardiologist: Tessa Lerner, DO   Interval Summary  .    Continues to have ongoing chest pain and shortness of breath.   Yesterday she walked to the elevator and back to her room 3 separate times and developed worsening chest pain and shortness of breath with this.   Vital Signs .    Vitals:   08/15/23 2027 08/15/23 2103 08/16/23 0154 08/16/23 0452  BP:  114/76 (!) 110/56 121/72  Pulse: 62  (!) 58 62  Resp:    18  Temp:    97.7 F (36.5 C)  TempSrc:    Oral  SpO2: 95%  95% 97%  Weight:    113.1 kg  Height:        Intake/Output Summary (Last 24 hours) at 08/16/2023 0907 Last data filed at 08/16/2023 0140 Gross per 24 hour  Intake 720 ml  Output 1500 ml  Net -780 ml      08/16/2023    4:52 AM 08/15/2023    4:10 AM 08/14/2023    6:27 AM  Last 3 Weights  Weight (lbs) 249 lb 4.8 oz 252 lb 251 lb 15.8 oz  Weight (kg) 113.082 kg 114.306 kg 114.3 kg      Telemetry/ECG    Sinus Rhythm--Sinus bradycardia - Personally Reviewed  Physical Exam .   GEN: No acute distress.   Neck: No JVD Cardiac: RRR, no murmurs, rubs, or gallops.  Respiratory: Clear to auscultation bilaterally. GI: Soft, nontender, non-distended  MS: No edema  Assessment & Plan .     [name] hx of CAD s/p DES to Lcx (07/23/23), HLD, TIIDM, tobacco abuse, sleep apnea, HTN, anxiety/depression with recent admission for NSTEMI who presented with SOB, exercise intolerance and chest pain.    Unstable Angina Recent NSTEMI CAD s/p recent DES to Lcx (07/2023) -- Recently presented with NSTEMI and underwent PCI to circumflex.  Does have residual disease in the LAD with initial plan to treat medically as it was felt PCI of the proximal/mid LAD would potentially jail moderate to large diagonal branch.  She initially felt well after discharge.  On Friday night developed recurrent chest pain with pain into her jaw.   This was similar but not as severe as what she initially experienced requiring PCI.  She has been compliant with her home medication regimen.  -- hsTn 12>> 12>>10, EKG without acute changes -- Sed rate, CRP normal -- Medical therapy includes metoprolol 25 mg twice daily, lisinopril 5 mg daily, Imdur 30 mg daily.  BP remains soft at times, and HR in the 50-60 range. Suspect she may not be able to tolerate further aggressive titration of medical therapy at this point.  -- in regards to her shortness of breath did consider that Brilinta may be an contributing but dyspnea sounds exertional  -- repeat limited echo pending -- Will review plan with MD  -- Continue aspirin, Brilinta, metoprolol 25 mg twice daily, lisinopril 5 mg daily, Imdur 30 mg daily, Lipitor 80 mg daily, Zetia and fenofibrate   HTN -- Controlled, soft -- Continue metoprolol 25 mg twice daily, lisinopril 5 mg daily, Imdur 30 mg daily   HLD -- Continue atorvastatin 80 mg daily, Zetia, fenofibrate   Diabetes -- Hemoglobin A1c 6.5 -- PTA meds-> Lantus, metformin, Farxiga and Mounjaro   Per primary Depression Anxiety OSA Tobacco use- reports she has not smoked since discharge   For questions or updates, please contact  [facility] Please consult [email or web address] for contact info under        Signed, Laverda Page, NP

## 2023-08-16 NOTE — Plan of Care (Signed)
  Problem: Clinical Measurements: Goal: Respiratory complications will improve Outcome: Progressing Goal: Cardiovascular complication will be avoided Outcome: Progressing   Problem: Activity: Goal: Risk for activity intolerance will decrease Outcome: Progressing   Problem: Coping: Goal: Level of anxiety will decrease Outcome: Progressing   Problem: Elimination: Goal: Will not experience complications related to urinary retention Outcome: Completed/Met

## 2023-08-17 ENCOUNTER — Inpatient Hospital Stay (HOSPITAL_COMMUNITY): Payer: 59

## 2023-08-17 ENCOUNTER — Other Ambulatory Visit (HOSPITAL_COMMUNITY): Payer: Self-pay

## 2023-08-17 ENCOUNTER — Other Ambulatory Visit (HOSPITAL_COMMUNITY): Payer: 59

## 2023-08-17 DIAGNOSIS — R079 Chest pain, unspecified: Secondary | ICD-10-CM | POA: Diagnosis not present

## 2023-08-17 DIAGNOSIS — I10 Essential (primary) hypertension: Secondary | ICD-10-CM | POA: Diagnosis not present

## 2023-08-17 DIAGNOSIS — R7989 Other specified abnormal findings of blood chemistry: Secondary | ICD-10-CM | POA: Diagnosis not present

## 2023-08-17 DIAGNOSIS — E119 Type 2 diabetes mellitus without complications: Secondary | ICD-10-CM | POA: Diagnosis not present

## 2023-08-17 LAB — PULMONARY FUNCTION TEST
DL/VA % pred: 94 %
DL/VA: 4.06 ml/min/mmHg/L
DLCO cor % pred: 87 %
DLCO cor: 19.14 ml/min/mmHg
DLCO unc % pred: 89 %
DLCO unc: 19.54 ml/min/mmHg
FEF 25-75 Post: 4.9 L/s
FEF 25-75 Pre: 3.7 L/s
FEF2575-%Change-Post: 32 %
FEF2575-%Pred-Post: 171 %
FEF2575-%Pred-Pre: 129 %
FEV1-%Change-Post: 8 %
FEV1-%Pred-Post: 100 %
FEV1-%Pred-Pre: 92 %
FEV1-Post: 2.93 L
FEV1-Pre: 2.71 L
FEV1FVC-%Change-Post: 2 %
FEV1FVC-%Pred-Pre: 109 %
FEV6-%Change-Post: 7 %
FEV6-%Pred-Post: 91 %
FEV6-%Pred-Pre: 85 %
FEV6-Post: 3.27 L
FEV6-Pre: 3.06 L
FEV6FVC-%Pred-Post: 102 %
FEV6FVC-%Pred-Pre: 102 %
FVC-%Change-Post: 5 %
FVC-%Pred-Post: 88 %
FVC-%Pred-Pre: 83 %
FVC-Post: 3.27 L
FVC-Pre: 3.09 L
Post FEV1/FVC ratio: 90 %
Post FEV6/FVC ratio: 100 %
Pre FEV1/FVC ratio: 88 %
Pre FEV6/FVC Ratio: 100 %
RV % pred: 91 %
RV: 1.66 L
TLC % pred: 98 %
TLC: 5.13 L

## 2023-08-17 LAB — GLUCOSE, CAPILLARY
Glucose-Capillary: 125 mg/dL — ABNORMAL HIGH (ref 70–99)
Glucose-Capillary: 202 mg/dL — ABNORMAL HIGH (ref 70–99)
Glucose-Capillary: 170 mg/dL — ABNORMAL HIGH (ref 70–99)
Glucose-Capillary: 206 mg/dL — ABNORMAL HIGH (ref 70–99)

## 2023-08-17 LAB — CBC
HCT: 42.8 % (ref 36.0–46.0)
Hemoglobin: 14.1 g/dL (ref 12.0–15.0)
MCH: 30.5 pg (ref 26.0–34.0)
MCHC: 32.9 g/dL (ref 30.0–36.0)
MCV: 92.4 fL (ref 80.0–100.0)
Platelets: 198 10*3/uL (ref 150–400)
RBC: 4.63 MIL/uL (ref 3.87–5.11)
RDW: 12.9 % (ref 11.5–15.5)
WBC: 8.8 10*3/uL (ref 4.0–10.5)
nRBC: 0 % (ref 0.0–0.2)

## 2023-08-17 MED ORDER — ALBUTEROL SULFATE (2.5 MG/3ML) 0.083% IN NEBU
2.5000 mg | INHALATION_SOLUTION | Freq: Once | RESPIRATORY_TRACT | Status: AC
  Administered 2023-08-17: 2.5 mg via RESPIRATORY_TRACT

## 2023-08-17 MED ORDER — ENOXAPARIN SODIUM 60 MG/0.6ML IJ SOSY: 0.5000 mg/kg | PREFILLED_SYRINGE | INTRAMUSCULAR | Status: DC

## 2023-08-17 MED ORDER — INSULIN ASPART 100 UNIT/ML IJ SOLN: 0.0000 [IU] | Freq: Every day | INTRAMUSCULAR | Status: DC

## 2023-08-17 MED ORDER — ENOXAPARIN SODIUM 40 MG/0.4ML IJ SOSY
40.0000 mg | PREFILLED_SYRINGE | Freq: Every day | INTRAMUSCULAR | Status: DC
  Administered 2023-08-18: 40 mg via SUBCUTANEOUS
  Filled 2023-08-17: qty 0.4

## 2023-08-17 MED ORDER — SODIUM CHLORIDE 0.9 % IV SOLN
0.7500 ug/kg/min | INTRAVENOUS | Status: DC
  Administered 2023-08-17 – 2023-08-18 (×4): 0.75 ug/kg/min via INTRAVENOUS
  Filled 2023-08-17 (×5): qty 50

## 2023-08-17 MED ORDER — INSULIN ASPART 100 UNIT/ML IJ SOLN
0.0000 [IU] | Freq: Three times a day (TID) | INTRAMUSCULAR | Status: DC
  Administered 2023-08-17: 3 [IU] via SUBCUTANEOUS
  Administered 2023-08-17: 5 [IU] via SUBCUTANEOUS
  Administered 2023-08-18: 2 [IU] via SUBCUTANEOUS
  Administered 2023-08-18: 3 [IU] via SUBCUTANEOUS

## 2023-08-17 MED ORDER — ENOXAPARIN SODIUM 60 MG/0.6ML IJ SOSY
60.0000 mg | PREFILLED_SYRINGE | INTRAMUSCULAR | Status: DC
  Administered 2023-08-17: 60 mg via SUBCUTANEOUS
  Filled 2023-08-17: qty 0.6

## 2023-08-17 MED ORDER — INSULIN ASPART 100 UNIT/ML IJ SOLN
4.0000 [IU] | Freq: Three times a day (TID) | INTRAMUSCULAR | Status: DC
  Administered 2023-08-17 – 2023-08-18 (×5): 4 [IU] via SUBCUTANEOUS

## 2023-08-17 NOTE — Telephone Encounter (Signed)
Completed [employer] form faxed to [employer] and scanned into chart. Billing notified.

## 2023-08-17 NOTE — Progress Notes (Signed)
[facility] PROGRESS NOTE    Progress Note  Renee Ho  ZOX:[id number] DOB: 1975-11-14 DOA: 08/14/2023 PCP: Melida Quitter, PA     Brief Narrative:   Renee Ho is an 47 y.o. female past medical history significant for morbid obesity, struct of sleep apnea diabetes mellitus current smoker CAD NSTEMI with a stent recently discharged on 07/27/2023 for NSTEMI with drug-eluting stent to the left circumflex plan was to continue DAPT therapy for 3 months and then reevaluate.  Continue to have shortness of breath with exertion.  Back to the ED for shortness of breath and chest pain   Assessment/Plan:   Recent STEMI with a history of CAD status post DES to the left circumflex on 07/23/2023: Cardiology was consulted and she continued to have ongoing chest pain and shortness of breath sed rate and CRP are normal. Currently on metoprolol statin and Imdur, DAPT therapy has been held. Limited echo was pending. Cardiac recommended to get CVTS involved to consider CABG.  She reports need to be on something from an antiplatelet standpoint prior to CABG. Further management per cardiology.  Diabetes mellitus type 2: With last A1c of 6.5. Continue Lantus and sliding scale.  Essential hypertension: Currently on metoprolol Imdur lisinopril. Holding hydrochlorothiazide.  Hyperlipidemia: Continue statin and fenofibrate and Zetia.  Morbid obesity: Has been counseled.  PCOS: Status post hysterectomy and unilateral oophorectomy.  Started sleep apnea: Continue CPAP.  Anxiety and depression: Continue Cymbalta.  DVT prophylaxis: lovenox Family Communication:none Status is: Inpatient Remains inpatient appropriate because: Chest pain and shortness of breath    Code Status:     Code Status Orders  (From admission, onward)           Start     Ordered   08/14/23 0528  Full code  Continuous       Question:  By:  Answer:  Consent: discussion  documented in EHR   08/14/23 0527           Code Status History     Date Active Date Inactive Code Status Order ID Comments User Context   07/23/2023 1736 07/27/2023 1519 Full Code [id number]  Kathleene Hazel, MD Inpatient   07/23/2023 0944 07/23/2023 1736 Full Code [id number]  Clydie Braun, MD ED   06/19/2022 1034 06/23/2022 1850 Full Code [id number]  Bobette Mo, MD Inpatient         IV Access:   Peripheral IV   Procedures and diagnostic studies:   ECHOCARDIOGRAM LIMITED  Result Date: 08/16/2023    ECHOCARDIOGRAM LIMITED REPORT   Patient Name:   Renee Ho Date of Exam: 08/16/2023 Medical Rec #:  [id number]                  Height:       65.0 in Accession #:    [id number]                 Weight:       249.3 lb Date of Birth:  26-Apr-1976                   BSA:          2.172 m Patient Age:    47 years                   BP:           121/72 mmHg Patient Gender: F  HR:           53 bpm. Exam Location:  Inpatient Procedure: Limited Echo, Color Doppler and Intracardiac Opacification Agent Indications:    Dyspnea  History:        Patient has prior history of Echocardiogram examinations, most                 recent 07/23/2023. CAD and NSTEMI, Polycythemia,                 Signs/Symptoms:Dyspnea; Risk Factors:Sleep Apnea, Dyslipidemia,                 Hypertension, Diabetes and Current Smoker.  Sonographer:    Milbert Coulter Referring Phys: [id number] [facility] PATWARDHAN IMPRESSIONS  1. Left ventricular ejection fraction, by estimation, is 60 to 65%. The left ventricle has normal function. The left ventricle has no regional wall motion abnormalities. There is moderate asymmetric left ventricular hypertrophy of the basal-septal segment.  2. The mitral valve is grossly normal. Trivial mitral valve regurgitation.  3. The aortic valve is grossly normal. Aortic valve regurgitation is not visualized.  4. The inferior vena cava is normal in size with  greater than 50% respiratory variability, suggesting right atrial pressure of 3 mmHg. FINDINGS  Left Ventricle: Left ventricular ejection fraction, by estimation, is 60 to 65%. The left ventricle has normal function. The left ventricle has no regional wall motion abnormalities. Definity contrast agent was given IV to delineate the left ventricular  endocardial borders. The left ventricular internal cavity size was normal in size. There is moderate asymmetric left ventricular hypertrophy of the basal-septal segment. Pericardium: There is no evidence of pericardial effusion. Presence of epicardial fat layer. Mitral Valve: The mitral valve is grossly normal. Trivial mitral valve regurgitation. Tricuspid Valve: The tricuspid valve is grossly normal. Tricuspid valve regurgitation is not demonstrated. Aortic Valve: The aortic valve is grossly normal. Aortic valve regurgitation is not visualized. Pulmonic Valve: The pulmonic valve was grossly normal. Pulmonic valve regurgitation is not visualized. Venous: The inferior vena cava is normal in size with greater than 50% respiratory variability, suggesting right atrial pressure of 3 mmHg. Additional Comments: Color Doppler performed.  LEFT VENTRICLE PLAX 2D LVIDd:         4.90 cm LVIDs:         3.10 cm LV PW:         1.00 cm LV IVS:        1.50 cm  LEFT ATRIUM         Index LA diam:    4.40 cm 2.03 cm/m Lennie Odor MD Electronically signed by Lennie Odor MD Signature Date/Time: 08/16/2023/1:03:41 PM    Final    DG CHEST PORT 1 VIEW  Result Date: 08/15/2023 CLINICAL DATA:  Chest pain, recent heart attack EXAM: PORTABLE CHEST 1 VIEW COMPARISON:  08/14/2023 FINDINGS: Cardiac and mediastinal contours are within normal limits. Mild bibasilar opacities, favored to represent atelectasis. No pleural effusion or pneumothorax. No acute osseous abnormality. IMPRESSION: Mild bibasilar opacities, favored to represent atelectasis. Electronically Signed   By: Wiliam Ke M.D.   On:  08/15/2023 14:37     Medical Consultants:   None.   Subjective:    Renee Ho relates currently chest pain-free and shortness of breath free.  Objective:    Vitals:   08/16/23 2309 08/17/23 0631 08/17/23 0755 08/17/23 0917  BP: 127/71 115/73 (!) 98/58 (!) 110/57  Pulse: 63 (!) 54    Resp:  20 20  Temp:  97.9 F (36.6 C) 98.2 F (36.8 C)   TempSrc:  Oral Oral   SpO2:  99%    Weight:  114.5 kg    Height:       SpO2: 99 %   Intake/Output Summary (Last 24 hours) at 08/17/2023 0924 Last data filed at 08/16/2023 1848 Gross per 24 hour  Intake --  Output 2300 ml  Net -2300 ml   Filed Weights   08/15/23 0410 08/16/23 0452 08/17/23 0631  Weight: 114.3 kg 113.1 kg 114.5 kg    Exam: General exam: In no acute distress. Respiratory system: Good air movement and clear to auscultation. Cardiovascular system: S1 & S2 heard, RRR. No JVD. Gastrointestinal system: Abdomen is nondistended, soft and nontender.  Extremities: No pedal edema. Skin: No rashes, lesions or ulcers Psychiatry: Judgement and insight appear normal. Mood & affect appropriate.    Data Reviewed:    Labs: Basic Metabolic Panel: Recent Labs  Lab 08/14/23 0342 08/14/23 0635 08/15/23 0450  NA 138  --  140  K 3.5  --  4.2  CL 100  --  105  CO2 28  --  28  GLUCOSE 276*  --  139*  BUN 22*  --  15  CREATININE 1.01*  --  1.00  CALCIUM 9.0  --  8.8*  MG  --  2.4  --    GFR Estimated Creatinine Clearance: 85.9 mL/min (by C-G formula based on SCr of 1 mg/dL). Liver Function Tests: Recent Labs  Lab 08/14/23 0342 08/15/23 0450  AST 19 16  ALT 9 11  ALKPHOS 40 34*  BILITOT 0.6 0.7  PROT 6.8 6.1*  ALBUMIN 3.7 3.4*   Recent Labs  Lab 08/14/23 0635  LIPASE 49   No results for input(s): "AMMONIA" in the last 168 hours. Coagulation profile No results for input(s): "INR", "PROTIME" in the last 168 hours. COVID-19 Labs  Recent Labs    08/15/23 1040  CRP 0.7    No results  found for: "SARSCOV2NAA"  CBC: Recent Labs  Lab 08/14/23 0159 08/14/23 0342 08/15/23 0450 08/16/23 0635 08/17/23 0454  WBC 6.7 7.9 8.4 8.7 8.8  NEUTROABS 3.7  --   --   --   --   HGB 12.2 14.9 14.1 14.3 14.1  HCT 35.7* 44.4 42.9 43.3 42.8  MCV 92.2 90.2 91.3 89.8 92.4  PLT 155 191 166 189 198   Cardiac Enzymes: No results for input(s): "CKTOTAL", "CKMB", "CKMBINDEX", "TROPONINI" in the last 168 hours. BNP (last 3 results) No results for input(s): "PROBNP" in the last 8760 hours. CBG: Recent Labs  Lab 08/16/23 0825 08/16/23 1149 08/16/23 1604 08/16/23 2153 08/17/23 0757  GLUCAP 126* 164* 172* 227* 125*   D-Dimer: No results for input(s): "DDIMER" in the last 72 hours. Hgb A1c: No results for input(s): "HGBA1C" in the last 72 hours. Lipid Profile: No results for input(s): "CHOL", "HDL", "LDLCALC", "TRIG", "CHOLHDL", "LDLDIRECT" in the last 72 hours. Thyroid function studies: No results for input(s): "TSH", "T4TOTAL", "T3FREE", "THYROIDAB" in the last 72 hours.  Invalid input(s): "FREET3" Anemia work up: No results for input(s): "VITAMINB12", "FOLATE", "FERRITIN", "TIBC", "IRON", "RETICCTPCT" in the last 72 hours. Sepsis Labs: Recent Labs  Lab 08/14/23 0342 08/15/23 0450 08/16/23 0635 08/17/23 0454  WBC 7.9 8.4 8.7 8.8   Microbiology No results found for this or any previous visit (from the past 240 hour(s)).   Medications:    aspirin EC  81 mg Oral Daily   atorvastatin  80  mg Oral Daily   DULoxetine  90 mg Oral Daily   enoxaparin (LOVENOX) injection  60 mg Subcutaneous Q24H   ezetimibe  10 mg Oral Daily   fenofibrate  160 mg Oral Daily   insulin aspart  0-9 Units Subcutaneous TID WC   insulin glargine-yfgn  75 Units Subcutaneous QHS   isosorbide mononitrate  30 mg Oral Daily   lisinopril  5 mg Oral Daily   metoprolol tartrate  25 mg Oral BID   ticagrelor  90 mg Oral BID   Continuous Infusions:    LOS: 2 days   Marinda Elk  [facility]  08/17/2023, 9:24 AM

## 2023-08-17 NOTE — Progress Notes (Signed)
Discussed with pt IS (2500 ml), sternal precautions, mobility post op and d/c planning. Receptive. She will have many family members at d/c. Gave ed materials to view. Walking independently although cautioned her to report CP.  [id number] Ethelda Chick BS, ACSM-CEP 08/17/2023 2:55 PM

## 2023-08-17 NOTE — TOC Benefit Eligibility Note (Signed)
Patient Product/process development scientist completed.    The patient is insured through [facility]. Patient has [employer], may use a copay card, and/or apply for patient assistance if available.    Ran test claim for Vascepa 1 g and the current 30 day co-pay is $5.00.   This test claim was processed through [facility]- copay amounts may vary at other pharmacies due to pharmacy/plan contracts, or as the patient moves through the different stages of their insurance plan.     Roland Earl, CPHT Pharmacy Technician III Certified Patient Advocate [facility] Patient Advocate Team Direct Number: [phone]  Fax: [phone]

## 2023-08-17 NOTE — Progress Notes (Signed)
   Patient Name: Renee Ho Date of Encounter: 08/17/2023 [facility] Cardiologist: Tessa Lerner, DO   Interval Summary  .    More chest pain while she was washing up this morning. Improved once she sat down in the bed.   Vital Signs .    Vitals:   08/16/23 2309 08/17/23 0631 08/17/23 0755 08/17/23 0917  BP: 127/71 115/73 (!) 98/58 (!) 110/57  Pulse: 63 (!) 54    Resp:  20 20   Temp:  97.9 F (36.6 C) 98.2 F (36.8 C)   TempSrc:  Oral Oral   SpO2:  99%    Weight:  114.5 kg    Height:        Intake/Output Summary (Last 24 hours) at 08/17/2023 0920 Last data filed at 08/16/2023 1848 Gross per 24 hour  Intake --  Output 2300 ml  Net -2300 ml      08/17/2023    6:31 AM 08/16/2023    4:52 AM 08/15/2023    4:10 AM  Last 3 Weights  Weight (lbs) 252 lb 6.4 oz 249 lb 4.8 oz 252 lb  Weight (kg) 114.488 kg 113.082 kg 114.306 kg      Telemetry/ECG    Sinus Bradycardia - Personally Reviewed  Physical Exam .   GEN: No acute distress.   Neck: No JVD Cardiac: RRR, no murmurs, rubs, or gallops.  Respiratory: Clear to auscultation bilaterally. GI: Soft, nontender, non-distended  MS: No edema  Assessment & Plan .     Renee Ho hx of CAD s/p DES to Lcx (07/23/23), HLD, TIIDM, tobacco abuse, sleep apnea, HTN, anxiety/depression with recent admission for NSTEMI who presented with SOB, exercise intolerance and chest pain.    Unstable Angina Recent NSTEMI CAD s/p recent DES to Lcx (07/2023) -- Recently presented with NSTEMI and underwent PCI to circumflex.  Does have residual disease in the LAD with initial plan to treat medically as it was felt PCI of the proximal/mid LAD would potentially jail moderate to large diagonal branch.  She initially felt well after discharge.  On Friday night developed recurrent chest pain with pain into her jaw.  This was similar but not as severe as what she initially experienced requiring PCI.  She has been compliant  with her home medication regimen.  -- hsTn 12>> 12>>10, EKG without acute changes -- Sed rate, CRP normal -- Medical therapy includes metoprolol 25 mg twice daily, lisinopril 5 mg daily, Imdur 30 mg daily.  BP remains soft at times, and HR in the 50-60 range. Unstable to tolerate further aggressive titration of medical therapy at this point.  -- limited echo with LVEF of 60-65%, no rWMA, moderate LVH -- seen by TCTS yesterday, MD consult pending. With potential plan for CABG, brilinta has been held. Will start IV cangrelor now -- Continue aspirin, metoprolol 25 mg twice daily, Imdur 30 mg daily, Lipitor 80 mg daily, Zetia and fenofibrate   HTN -- Controlled, soft -- Continue metoprolol 25 mg twice daily, Imdur 30 mg daily -- hold lisinopril with pending CABG   HLD -- Continue atorvastatin 80 mg daily, Zetia, fenofibrate   Diabetes -- Hemoglobin A1c 6.5 -- PTA meds-> Lantus, metformin, Farxiga and Mounjaro   Per primary Depression Anxiety OSA Tobacco use- reports she has not smoked since discharge   For questions or updates, please contact [facility] Please consult [email or web address] for contact info under   Signed, Laverda Page, NP

## 2023-08-18 ENCOUNTER — Inpatient Hospital Stay (HOSPITAL_COMMUNITY): Payer: 59

## 2023-08-18 DIAGNOSIS — I200 Unstable angina: Secondary | ICD-10-CM | POA: Diagnosis not present

## 2023-08-18 DIAGNOSIS — Z01810 Encounter for preprocedural cardiovascular examination: Secondary | ICD-10-CM

## 2023-08-18 DIAGNOSIS — R079 Chest pain, unspecified: Secondary | ICD-10-CM | POA: Diagnosis not present

## 2023-08-18 DIAGNOSIS — I25110 Atherosclerotic heart disease of native coronary artery with unstable angina pectoris: Secondary | ICD-10-CM | POA: Diagnosis not present

## 2023-08-18 DIAGNOSIS — I10 Essential (primary) hypertension: Secondary | ICD-10-CM | POA: Diagnosis not present

## 2023-08-18 LAB — VAS US DOPPLER PRE CABG

## 2023-08-18 LAB — BASIC METABOLIC PANEL
Anion gap: 9 (ref 5–15)
BUN: 12 mg/dL (ref 6–20)
CO2: 27 mmol/L (ref 22–32)
Calcium: 9.1 mg/dL (ref 8.9–10.3)
Chloride: 102 mmol/L (ref 98–111)
Creatinine, Ser: 0.89 mg/dL (ref 0.44–1.00)
GFR, Estimated: >60 mL/min (ref >60)
Glucose, Bld: 142 mg/dL — ABNORMAL HIGH (ref 70–99)
Potassium: 4.2 mmol/L (ref 3.5–5.1)
Sodium: 138 mmol/L (ref 135–145)

## 2023-08-18 LAB — GLUCOSE, CAPILLARY
Glucose-Capillary: 125 mg/dL — ABNORMAL HIGH (ref 70–99)
Glucose-Capillary: 156 mg/dL — ABNORMAL HIGH (ref 70–99)
Glucose-Capillary: 103 mg/dL — ABNORMAL HIGH (ref 70–99)
Glucose-Capillary: 139 mg/dL — ABNORMAL HIGH (ref 70–99)

## 2023-08-18 LAB — BLOOD GAS, ARTERIAL
Acid-Base Excess: 3.8 mmol/L — ABNORMAL HIGH (ref 0.0–2.0)
Bicarbonate: 29.2 mmol/L — ABNORMAL HIGH (ref 20.0–28.0)
O2 Saturation: 98.2 %
Patient temperature: 36.8
pCO2 arterial: 46 mm[Hg] (ref 32–48)
pH, Arterial: 7.41 (ref 7.35–7.45)
pO2, Arterial: 89 mm[Hg] (ref 83–108)

## 2023-08-18 LAB — URINALYSIS, ROUTINE W REFLEX MICROSCOPIC
Bilirubin Urine: NEGATIVE
Glucose, UA: 150 mg/dL — AB
Hgb urine dipstick: NEGATIVE
Ketones, ur: NEGATIVE mg/dL
Leukocytes,Ua: NEGATIVE
Nitrite: NEGATIVE
Protein, ur: NEGATIVE mg/dL
Specific Gravity, Urine: 1.01 (ref 1.005–1.030)
pH: 5 (ref 5.0–8.0)

## 2023-08-18 LAB — CBC
HCT: 42.9 % (ref 36.0–46.0)
Hemoglobin: 14.3 g/dL (ref 12.0–15.0)
MCH: 30 pg (ref 26.0–34.0)
MCHC: 33.3 g/dL (ref 30.0–36.0)
MCV: 90.1 fL (ref 80.0–100.0)
Platelets: 186 10*3/uL (ref 150–400)
RBC: 4.76 MIL/uL (ref 3.87–5.11)
RDW: 12.9 % (ref 11.5–15.5)
WBC: 8.1 10*3/uL (ref 4.0–10.5)
nRBC: 0 % (ref 0.0–0.2)

## 2023-08-18 LAB — SURGICAL PCR SCREEN
MRSA, PCR: NEGATIVE
Staphylococcus aureus: POSITIVE — AB

## 2023-08-18 LAB — ABO/RH: ABO/RH(D): A POS

## 2023-08-18 LAB — PROTIME-INR
INR: 1 (ref 0.8–1.2)
Prothrombin Time: 13.2 s (ref 11.4–15.2)

## 2023-08-18 LAB — HEMOGLOBIN A1C
Hgb A1c MFr Bld: 6.7 % — ABNORMAL HIGH (ref 4.8–5.6)
Mean Plasma Glucose: 145.59 mg/dL

## 2023-08-18 LAB — APTT: aPTT: 31 s (ref 24–36)

## 2023-08-18 LAB — TYPE AND SCREEN
ABO/RH(D): A POS
Antibody Screen: NEGATIVE

## 2023-08-18 LAB — SARS CORONAVIRUS 2 BY RT PCR: SARS Coronavirus 2 by RT PCR: NEGATIVE

## 2023-08-18 MED ORDER — CHLORHEXIDINE GLUCONATE 0.12 % MT SOLN
15.0000 mL | Freq: Once | OROMUCOSAL | Status: AC
Start: 2023-08-19 — End: 2023-08-19
  Administered 2023-08-19: 15 mL via OROMUCOSAL
  Filled 2023-08-18: qty 15

## 2023-08-18 MED ORDER — BISACODYL 5 MG PO TBEC
5.0000 mg | DELAYED_RELEASE_TABLET | Freq: Once | ORAL | Status: AC
  Administered 2023-08-18: 5 mg via ORAL
  Filled 2023-08-18: qty 1

## 2023-08-18 MED ORDER — NOREPINEPHRINE 4 MG/250ML-% IV SOLN
0.0000 ug/min | INTRAVENOUS | Status: DC
  Filled 2023-08-18 (×2): qty 250

## 2023-08-18 MED ORDER — MUPIROCIN 2 % EX OINT
1.0000 | TOPICAL_OINTMENT | Freq: Two times a day (BID) | CUTANEOUS | Status: AC
  Administered 2023-08-18 – 2023-08-22 (×9): 1 via NASAL
  Filled 2023-08-18 (×4): qty 22

## 2023-08-18 MED ORDER — EPINEPHRINE HCL 5 MG/250ML IV SOLN IN NS
0.0000 ug/min | INTRAVENOUS | Status: DC
  Filled 2023-08-18 (×2): qty 250

## 2023-08-18 MED ORDER — INSULIN REGULAR(HUMAN) IN NACL 100-0.9 UT/100ML-% IV SOLN
INTRAVENOUS | Status: AC
  Administered 2023-08-19: 1.6 [IU]/h via INTRAVENOUS
  Filled 2023-08-18 (×2): qty 100

## 2023-08-18 MED ORDER — CHLORHEXIDINE GLUCONATE CLOTH 2 % EX PADS
6.0000 | MEDICATED_PAD | Freq: Once | CUTANEOUS | Status: AC
  Administered 2023-08-19: 6 via TOPICAL

## 2023-08-18 MED ORDER — LORAZEPAM 1 MG PO TABS
1.0000 mg | ORAL_TABLET | Freq: Four times a day (QID) | ORAL | Status: DC | PRN
  Administered 2023-08-18: 1 mg via ORAL
  Filled 2023-08-18: qty 1

## 2023-08-18 MED ORDER — POTASSIUM CHLORIDE 2 MEQ/ML IV SOLN
80.0000 meq | INTRAVENOUS | Status: DC
  Filled 2023-08-18 (×2): qty 40

## 2023-08-18 MED ORDER — DEXMEDETOMIDINE HCL IN NACL 400 MCG/100ML IV SOLN
0.1000 ug/kg/h | INTRAVENOUS | Status: AC
  Administered 2023-08-19: .3 ug/kg/h via INTRAVENOUS
  Filled 2023-08-18 (×2): qty 100

## 2023-08-18 MED ORDER — VANCOMYCIN HCL 1500 MG/300ML IV SOLN
1500.0000 mg | INTRAVENOUS | Status: AC
  Administered 2023-08-19: 1500 mg via INTRAVENOUS
  Filled 2023-08-18 (×3): qty 300

## 2023-08-18 MED ORDER — DIAZEPAM 5 MG PO TABS
5.0000 mg | ORAL_TABLET | Freq: Once | ORAL | Status: AC
  Administered 2023-08-19: 5 mg via ORAL
  Filled 2023-08-18: qty 1

## 2023-08-18 MED ORDER — METOPROLOL TARTRATE 12.5 MG HALF TABLET
12.5000 mg | ORAL_TABLET | Freq: Once | ORAL | Status: AC
  Administered 2023-08-19: 12.5 mg via ORAL
  Filled 2023-08-18: qty 1

## 2023-08-18 MED ORDER — MAGNESIUM SULFATE 50 % IJ SOLN
40.0000 meq | INTRAMUSCULAR | Status: DC
  Filled 2023-08-18: qty 9.85

## 2023-08-18 MED ORDER — POLYETHYLENE GLYCOL 3350 17 G PO PACK
17.0000 g | PACK | Freq: Every day | ORAL | Status: DC
  Administered 2023-08-18: 17 g via ORAL
  Filled 2023-08-18: qty 1

## 2023-08-18 MED ORDER — PLASMA-LYTE A IV SOLN
INTRAVENOUS | Status: DC
  Filled 2023-08-18 (×2): qty 2.5

## 2023-08-18 MED ORDER — CEFAZOLIN SODIUM-DEXTROSE 2-4 GM/100ML-% IV SOLN
2.0000 g | INTRAVENOUS | Status: DC
  Filled 2023-08-18 (×2): qty 100

## 2023-08-18 MED ORDER — PHENYLEPHRINE HCL-NACL 20-0.9 MG/250ML-% IV SOLN
30.0000 ug/min | INTRAVENOUS | Status: DC
  Filled 2023-08-18 (×2): qty 250

## 2023-08-18 MED ORDER — CHLORHEXIDINE GLUCONATE CLOTH 2 % EX PADS
6.0000 | MEDICATED_PAD | Freq: Once | CUTANEOUS | Status: AC
  Administered 2023-08-18: 6 via TOPICAL

## 2023-08-18 MED ORDER — NITROGLYCERIN IN D5W 200-5 MCG/ML-% IV SOLN
2.0000 ug/min | INTRAVENOUS | Status: AC
  Administered 2023-08-19: 10 ug/min via INTRAVENOUS
  Filled 2023-08-18 (×2): qty 250

## 2023-08-18 MED ORDER — CHLORHEXIDINE GLUCONATE CLOTH 2 % EX PADS
6.0000 | MEDICATED_PAD | Freq: Every day | CUTANEOUS | Status: DC
  Administered 2023-08-18 – 2023-08-19 (×2): 6 via TOPICAL

## 2023-08-18 MED ORDER — HEPARIN 30,000 UNITS/1000 ML (OHS) CELLSAVER SOLUTION
Status: DC
  Filled 2023-08-18: qty 1000

## 2023-08-18 MED ORDER — TRANEXAMIC ACID (OHS) PUMP PRIME SOLUTION
2.0000 mg/kg | INTRAVENOUS | Status: DC
  Filled 2023-08-18: qty 2.3

## 2023-08-18 MED ORDER — TRANEXAMIC ACID (OHS) BOLUS VIA INFUSION
15.0000 mg/kg | INTRAVENOUS | Status: AC
  Administered 2023-08-19: 1725 mg via INTRAVENOUS
  Filled 2023-08-18: qty 1725

## 2023-08-18 MED ORDER — CEFAZOLIN SODIUM-DEXTROSE 2-4 GM/100ML-% IV SOLN
2.0000 g | INTRAVENOUS | Status: AC
  Administered 2023-08-19 (×2): 2 g via INTRAVENOUS
  Filled 2023-08-18 (×2): qty 100

## 2023-08-18 MED ORDER — TRANEXAMIC ACID 1000 MG/10ML IV SOLN
1.5000 mg/kg/h | INTRAVENOUS | Status: AC
  Administered 2023-08-19: 1.5 mg/kg/h via INTRAVENOUS
  Filled 2023-08-18: qty 25

## 2023-08-18 MED ORDER — MILRINONE LACTATE IN DEXTROSE 20-5 MG/100ML-% IV SOLN
0.3000 ug/kg/min | INTRAVENOUS | Status: DC
  Filled 2023-08-18 (×2): qty 100

## 2023-08-18 NOTE — Plan of Care (Signed)
  Problem: Clinical Measurements: Goal: Ability to maintain clinical measurements within normal limits will improve Outcome: Progressing Goal: Will remain free from infection Outcome: Progressing Goal: Diagnostic test results will improve Outcome: Progressing Goal: Respiratory complications will improve Outcome: Progressing Goal: Cardiovascular complication will be avoided Outcome: Progressing   Problem: Activity: Goal: Risk for activity intolerance will decrease Outcome: Progressing   Problem: Coping: Goal: Level of anxiety will decrease Outcome: Progressing   Problem: Elimination: Goal: Will not experience complications related to bowel motility Outcome: Progressing   Problem: Safety: Goal: Ability to remain free from injury will improve Outcome: Progressing   Problem: Skin Integrity: Goal: Risk for impaired skin integrity will decrease Outcome: Progressing   Problem: Education: Goal: Ability to describe self-care measures that may prevent or decrease complications (Diabetes Survival Skills Education) will improve Outcome: Progressing Goal: Individualized Educational Video(s) Outcome: Progressing   Problem: Coping: Goal: Ability to adjust to condition or change in health will improve Outcome: Progressing   Problem: Fluid Volume: Goal: Ability to maintain a balanced intake and output will improve Outcome: Progressing   Problem: Health Behavior/Discharge Planning: Goal: Ability to identify and utilize available resources and services will improve Outcome: Progressing Goal: Ability to manage health-related needs will improve Outcome: Progressing   Problem: Metabolic: Goal: Ability to maintain appropriate glucose levels will improve Outcome: Progressing   Problem: Nutritional: Goal: Maintenance of adequate nutrition will improve Outcome: Progressing Goal: Progress toward achieving an optimal weight will improve Outcome: Progressing   Problem: Skin  Integrity: Goal: Risk for impaired skin integrity will decrease Outcome: Progressing   Problem: Tissue Perfusion: Goal: Adequacy of tissue perfusion will improve Outcome: Progressing

## 2023-08-18 NOTE — Progress Notes (Signed)
Patient Name: Renee Ho Date of Encounter: 08/18/2023 [facility] Cardiologist: Tessa Lerner, DO   Interval Summary  .    Still with mild chest pain this morning. Friends at the bedside. Planned for CABG tomorrow.   Vital Signs .    Vitals:   08/17/23 2038 08/17/23 2208 08/18/23 0311 08/18/23 0742  BP: (!) 143/66 (!) 143/66 135/76 124/77  Pulse: 60 (!) 57  (!) 57  Resp: 16  18 16   Temp: 97.7 F (36.5 C)  97.7 F (36.5 C) 97.8 F (36.6 C)  TempSrc: Axillary  Oral Oral  SpO2: 100%  99% 100%  Weight:   115 kg   Height:        Intake/Output Summary (Last 24 hours) at 08/18/2023 0804 Last data filed at 08/18/2023 0645 Gross per 24 hour  Intake 434.48 ml  Output 5800 ml  Net -5365.52 ml      08/18/2023    3:11 AM 08/17/2023    6:31 AM 08/16/2023    4:52 AM  Last 3 Weights  Weight (lbs) 253 lb 9.6 oz 252 lb 6.4 oz 249 lb 4.8 oz  Weight (kg) 115.032 kg 114.488 kg 113.082 kg      Telemetry/ECG    Sinus Bradycardia - Personally Reviewed  Physical Exam .   GEN: No acute distress.   Neck: No JVD Cardiac: RRR, no murmurs, rubs, or gallops.  Respiratory: Clear to auscultation bilaterally. GI: Soft, nontender, non-distended  MS: No edema  Assessment & Plan .     Renee Ho hx of CAD s/p DES to Lcx (07/23/23), HLD, TIIDM, tobacco abuse, sleep apnea, HTN, anxiety/depression with recent admission for NSTEMI who presented with SOB, exercise intolerance and chest pain.    Unstable Angina Recent NSTEMI CAD s/p recent DES to Lcx (07/2023) -- Recently presented with NSTEMI and underwent PCI to circumflex.  Does have residual disease in the LAD with initial plan to treat medically as it was felt PCI of the proximal/mid LAD would potentially jail moderate to large diagonal branch.  She initially felt well after discharge.  On Friday night developed recurrent chest pain with pain into her jaw.  This was similar but not as severe as what she  initially experienced requiring PCI.  She has been compliant with her home medication regimen.  -- hsTn 12>> 12>>10, EKG without acute changes -- Sed rate, CRP normal -- Medical therapy included metoprolol 25 mg twice daily, lisinopril 5 mg daily, Imdur 30 mg daily.  BP remains soft at times, and HR in the 50-60 range. Unstable to tolerate further aggressive titration of medical therapy at this point.  -- limited echo with LVEF of 60-65%, no rWMA, moderate LVH -- seen by TCTS with recommendations to proceed with surgery as she continues to have anginal symptoms. Brilinta has been held, now on IV cangrelor -- Continue aspirin, metoprolol 25 mg twice daily, Imdur 30 mg daily, Lipitor 80 mg daily, Zetia and fenofibrate   HTN -- Controlled, soft -- Continue metoprolol 25 mg twice daily, Imdur 30 mg daily -- hold lisinopril with pending CABG   HLD -- Continue atorvastatin 80 mg daily, Zetia, fenofibrate   Diabetes -- Hemoglobin A1c 6.7 -- PTA meds-> Lantus, metformin, Farxiga and Mounjaro   Per primary Depression Anxiety OSA Tobacco use- reports she has not smoked since discharge   For questions or updates, please contact [facility] Please consult [email or web address] for contact info under        Signed,  Laverda Page, NP

## 2023-08-18 NOTE — Progress Notes (Signed)
Procedure(s) (LRB): CORONARY ARTERY BYPASS GRAFTING (CABG) (N/A) TRANSESOPHAGEAL ECHOCARDIOGRAM (N/A) Subjective: No CP, anxious about surgery  Objective: Vital signs in last 24 hours: Temp:  [97.7 F (36.5 C)-98.5 F (36.9 C)] 98.2 F (36.8 C) (11/07 1433) Pulse Rate:  [54-64] 60 (11/07 1433) Cardiac Rhythm: Sinus bradycardia (11/07 0700) Resp:  [16-18] 16 (11/07 1433) BP: (105-143)/(59-80) 108/59 (11/07 1433) SpO2:  [98 %-100 %] 99 % (11/07 1433) Weight:  [629 kg] 115 kg (11/07 0311)  Hemodynamic parameters for last 24 hours:    Intake/Output from previous day: 11/06 0701 - 11/07 0700 In: 434.5 [I.V.:434.5] Out: 5800 [Urine:5800] Intake/Output this shift: Total I/O In: 600 [P.O.:600] Out: 700 [Urine:700]  unchanged  Lab Results: Recent Labs    08/17/23 0454 08/18/23 0427  WBC 8.8 8.1  HGB 14.1 14.3  HCT 42.8 42.9  PLT 198 186   BMET:  Recent Labs    08/18/23 0427  NA 138  K 4.2  CL 102  CO2 27  GLUCOSE 142*  BUN 12  CREATININE 0.89  CALCIUM 9.1    PT/INR:  Recent Labs    08/18/23 0427  LABPROT 13.2  INR 1.0   ABG    Component Value Date/Time   HCO3 23.8 12/19/2007 2006   TCO2 26 10/01/2010 1653   CBG (last 3)  Recent Labs    08/17/23 2052 08/18/23 0739 08/18/23 1205  GLUCAP 206* 125* 156*    Assessment/Plan: S/P Procedure(s) (LRB): CORONARY ARTERY BYPASS GRAFTING (CABG) (N/A) TRANSESOPHAGEAL ECHOCARDIOGRAM (N/A) 3 vessel CA D, s/p DES to circumflex with residual LAD and RCA disease For CABG in AM  All questions answered   LOS: 3 days    Renee Ho 08/18/2023

## 2023-08-18 NOTE — Plan of Care (Signed)
  Problem: Clinical Measurements: Goal: Respiratory complications will improve Outcome: Progressing Goal: Cardiovascular complication will be avoided Outcome: Progressing   Problem: Activity: Goal: Risk for activity intolerance will decrease Outcome: Progressing   Problem: Safety: Goal: Ability to remain free from injury will improve Outcome: Progressing   Problem: Skin Integrity: Goal: Risk for impaired skin integrity will decrease Outcome: Progressing   

## 2023-08-18 NOTE — Progress Notes (Signed)
Pt on CPAP, declined walk.

## 2023-08-18 NOTE — Progress Notes (Signed)
[facility] PROGRESS NOTE    Progress Note  Renee Ho  BMW:[id number] DOB: 12/22/1975 DOA: 08/14/2023 PCP: Melida Quitter, PA     Brief Narrative:   Renee Ho is an 47 y.o. female past medical history significant for morbid obesity, struct of sleep apnea diabetes mellitus current smoker CAD NSTEMI with a stent recently discharged on 07/27/2023 for NSTEMI with drug-eluting stent to the left circumflex plan was to continue DAPT therapy for 3 months and then reevaluate.  Continue to have shortness of breath with exertion.  Back to the ED for shortness of breath and chest pain   Assessment/Plan:   Recent STEMI with a history of CAD status post DES to the left circumflex on 07/23/2023: Cardiology was consulted and she continued to have ongoing chest pain and shortness of breath sed rate and CRP are normal. Currently on metoprolol, statin and Imdur, Brillinta therapy has been held. Cardiac recommended to get CVTS involved to consider CABG.  Currently on IV cangrelor Further management per cardiology and CVTS.  Diabetes mellitus type 2: With last A1c of 6.5. Continue Lantus and sliding scale.  Essential hypertension: Currently on metoprolol Imdur lisinopril. Holding hydrochlorothiazide.  Hyperlipidemia: Continue statin and fenofibrate and Zetia.  Morbid obesity: Has been counseled.  PCOS: Status post hysterectomy and unilateral oophorectomy.  Started sleep apnea: Continue CPAP.  Anxiety and depression: Continue Cymbalta.  DVT prophylaxis: lovenox Family Communication:none Status is: Inpatient Remains inpatient appropriate because: Chest pain and shortness of breath    Code Status:     Code Status Orders  (From admission, onward)           Start     Ordered   08/14/23 0528  Full code  Continuous       Question:  By:  Answer:  Consent: discussion documented in EHR   08/14/23 0527           Code Status History      Date Active Date Inactive Code Status Order ID Comments User Context   07/23/2023 1736 07/27/2023 1519 Full Code [id number]  Kathleene Hazel, MD Inpatient   07/23/2023 0944 07/23/2023 1736 Full Code [id number]  Clydie Braun, MD ED   06/19/2022 1034 06/23/2022 1850 Full Code [id number]  Bobette Mo, MD Inpatient         IV Access:   Peripheral IV   Procedures and diagnostic studies:   No results found.   Medical Consultants:   None.   Subjective:    Renee Ho continues to be chest pain and shortness of breath free.  Objective:    Vitals:   08/17/23 2208 08/18/23 0311 08/18/23 0742 08/18/23 0828  BP: (!) 143/66 135/76 124/77 138/80  Pulse: (!) 57  (!) 57 64  Resp:  18 16 16   Temp:  97.7 F (36.5 C) 97.8 F (36.6 C) 97.7 F (36.5 C)  TempSrc:  Oral Oral Axillary  SpO2:  99% 100% 100%  Weight:  115 kg    Height:       SpO2: 100 %   Intake/Output Summary (Last 24 hours) at 08/18/2023 0956 Last data filed at 08/18/2023 0750 Gross per 24 hour  Intake 434.48 ml  Output 6000 ml  Net -5565.52 ml   Filed Weights   08/16/23 0452 08/17/23 0631 08/18/23 0311  Weight: 113.1 kg 114.5 kg 115 kg    Exam: General exam: In no acute distress. Respiratory system: Good air movement and clear to auscultation. Cardiovascular system: S1 &  S2 heard, RRR. No JVD. Gastrointestinal system: Abdomen is nondistended, soft and nontender.  Extremities: No pedal edema. Skin: No rashes, lesions or ulcers Psychiatry: Judgement and insight appear normal. Mood & affect appropriate.    Data Reviewed:    Labs: Basic Metabolic Panel: Recent Labs  Lab 08/14/23 0342 08/14/23 0635 08/15/23 0450 08/18/23 0427  NA 138  --  140 138  K 3.5  --  4.2 4.2  CL 100  --  105 102  CO2 28  --  28 27  GLUCOSE 276*  --  139* 142*  BUN 22*  --  15 12  CREATININE 1.01*  --  1.00 0.89  CALCIUM 9.0  --  8.8* 9.1  MG  --  2.4  --   --    GFR Estimated  Creatinine Clearance: 96.8 mL/min (by C-G formula based on SCr of 0.89 mg/dL). Liver Function Tests: Recent Labs  Lab 08/14/23 0342 08/15/23 0450  AST 19 16  ALT 9 11  ALKPHOS 40 34*  BILITOT 0.6 0.7  PROT 6.8 6.1*  ALBUMIN 3.7 3.4*   Recent Labs  Lab 08/14/23 0635  LIPASE 49   No results for input(s): "AMMONIA" in the last 168 hours. Coagulation profile Recent Labs  Lab 08/18/23 0427  INR 1.0   COVID-19 Labs  Recent Labs    08/15/23 1040  CRP 0.7    No results found for: "SARSCOV2NAA"  CBC: Recent Labs  Lab 08/14/23 0159 08/14/23 0342 08/15/23 0450 08/16/23 0635 08/17/23 0454 08/18/23 0427  WBC 6.7 7.9 8.4 8.7 8.8 8.1  NEUTROABS 3.7  --   --   --   --   --   HGB 12.2 14.9 14.1 14.3 14.1 14.3  HCT 35.7* 44.4 42.9 43.3 42.8 42.9  MCV 92.2 90.2 91.3 89.8 92.4 90.1  PLT 155 191 166 189 198 186   Cardiac Enzymes: No results for input(s): "CKTOTAL", "CKMB", "CKMBINDEX", "TROPONINI" in the last 168 hours. BNP (last 3 results) No results for input(s): "PROBNP" in the last 8760 hours. CBG: Recent Labs  Lab 08/17/23 0757 08/17/23 1219 08/17/23 1551 08/17/23 2052 08/18/23 0739  GLUCAP 125* 202* 170* 206* 125*   D-Dimer: No results for input(s): "DDIMER" in the last 72 hours. Hgb A1c: Recent Labs    08/18/23 0427  HGBA1C 6.7*   Lipid Profile: No results for input(s): "CHOL", "HDL", "LDLCALC", "TRIG", "CHOLHDL", "LDLDIRECT" in the last 72 hours. Thyroid function studies: No results for input(s): "TSH", "T4TOTAL", "T3FREE", "THYROIDAB" in the last 72 hours.  Invalid input(s): "FREET3" Anemia work up: No results for input(s): "VITAMINB12", "FOLATE", "FERRITIN", "TIBC", "IRON", "RETICCTPCT" in the last 72 hours. Sepsis Labs: Recent Labs  Lab 08/15/23 0450 08/16/23 0635 08/17/23 0454 08/18/23 0427  WBC 8.4 8.7 8.8 8.1   Microbiology Recent Results (from the past 240 hour(s))  Surgical pcr screen     Status: Abnormal   Collection Time:  08/18/23  3:47 AM   Specimen: Nasal Mucosa; Nasal Swab  Result Value Ref Range Status   MRSA, PCR NEGATIVE NEGATIVE Final   Staphylococcus aureus POSITIVE (A) NEGATIVE Final    Comment: (NOTE) The Xpert SA Assay (FDA approved for NASAL specimens in patients 75 years of age and older), is one component of a comprehensive surveillance program. It is not intended to diagnose infection nor to guide or monitor treatment. Performed at [facility], [address] [place], [place] [address]      Medications:    aspirin EC  81 mg Oral Daily   atorvastatin  80 mg Oral Daily   Chlorhexidine Gluconate Cloth  6 each Topical Q0600   DULoxetine  90 mg Oral Daily   enoxaparin (LOVENOX) injection  40 mg Subcutaneous Daily   ezetimibe  10 mg Oral Daily   fenofibrate  160 mg Oral Daily   insulin aspart  0-15 Units Subcutaneous TID WC   insulin aspart  0-5 Units Subcutaneous QHS   insulin aspart  4 Units Subcutaneous TID WC   insulin glargine-yfgn  75 Units Subcutaneous QHS   isosorbide mononitrate  30 mg Oral Daily   metoprolol tartrate  25 mg Oral BID   mupirocin ointment  1 Application Nasal BID   Continuous Infusions:  cangrelor (KENGREAL) 50 mg in sodium chloride 0.9 % 250 mL (0.2 mg/mL) infusion 0.75 mcg/kg/min (08/18/23 0645)      LOS: 3 days   Marinda Elk  [facility]  08/18/2023, 9:56 AM

## 2023-08-18 NOTE — Progress Notes (Signed)
VASCULAR LAB    Pre CABG Dopplers have been performed.  See CV proc for preliminary results.   Chyane Greer, RVT 08/18/2023, 10:34 AM

## 2023-08-19 ENCOUNTER — Other Ambulatory Visit: Payer: Self-pay

## 2023-08-19 ENCOUNTER — Inpatient Hospital Stay (HOSPITAL_COMMUNITY): Payer: 59 | Admitting: Anesthesiology

## 2023-08-19 ENCOUNTER — Inpatient Hospital Stay (HOSPITAL_COMMUNITY): Payer: 59

## 2023-08-19 ENCOUNTER — Inpatient Hospital Stay (HOSPITAL_COMMUNITY)
Admission: EM | Disposition: A | Discharge status: Rehabilitation Facility | Payer: Self-pay | Source: Home / Self Care | Attending: Thoracic Surgery (Cardiothoracic Vascular Surgery)

## 2023-08-19 ENCOUNTER — Encounter: Payer: 59 | Admitting: Thoracic Surgery (Cardiothoracic Vascular Surgery)

## 2023-08-19 ENCOUNTER — Encounter (HOSPITAL_COMMUNITY): Payer: Self-pay | Admitting: Internal Medicine

## 2023-08-19 DIAGNOSIS — I2510 Atherosclerotic heart disease of native coronary artery without angina pectoris: Secondary | ICD-10-CM | POA: Diagnosis present

## 2023-08-19 DIAGNOSIS — I25110 Atherosclerotic heart disease of native coronary artery with unstable angina pectoris: Secondary | ICD-10-CM | POA: Diagnosis not present

## 2023-08-19 HISTORY — PX: CORONARY ARTERY BYPASS GRAFT: SHX141

## 2023-08-19 HISTORY — PX: TEE WITHOUT CARDIOVERSION: SHX5443

## 2023-08-19 LAB — BASIC METABOLIC PANEL
Anion gap: 9 (ref 5–15)
Anion gap: 6 (ref 5–15)
BUN: 8 mg/dL (ref 6–20)
BUN: 13 mg/dL (ref 6–20)
CO2: 27 mmol/L (ref 22–32)
CO2: 24 mmol/L (ref 22–32)
Calcium: 8.9 mg/dL (ref 8.9–10.3)
Calcium: 7.8 mg/dL — ABNORMAL LOW (ref 8.9–10.3)
Chloride: 101 mmol/L (ref 98–111)
Chloride: 108 mmol/L (ref 98–111)
Creatinine, Ser: 0.87 mg/dL (ref 0.44–1.00)
Creatinine, Ser: 1 mg/dL (ref 0.44–1.00)
GFR, Estimated: >60 mL/min (ref >60)
GFR, Estimated: >60 mL/min (ref >60)
Glucose, Bld: 261 mg/dL — ABNORMAL HIGH (ref 70–99)
Glucose, Bld: 142 mg/dL — ABNORMAL HIGH (ref 70–99)
Potassium: 3.6 mmol/L (ref 3.5–5.1)
Potassium: 4.3 mmol/L (ref 3.5–5.1)
Sodium: 137 mmol/L (ref 135–145)
Sodium: 138 mmol/L (ref 135–145)

## 2023-08-19 LAB — POCT I-STAT, CHEM 8
BUN: 11 mg/dL (ref 6–20)
BUN: 10 mg/dL (ref 6–20)
BUN: 11 mg/dL (ref 6–20)
BUN: 11 mg/dL (ref 6–20)
BUN: 11 mg/dL (ref 6–20)
Calcium, Ion: 1.19 mmol/L (ref 1.15–1.40)
Calcium, Ion: 1.21 mmol/L (ref 1.15–1.40)
Calcium, Ion: 1.14 mmol/L — ABNORMAL LOW (ref 1.15–1.40)
Calcium, Ion: 1.12 mmol/L — ABNORMAL LOW (ref 1.15–1.40)
Calcium, Ion: 1.13 mmol/L — ABNORMAL LOW (ref 1.15–1.40)
Chloride: 103 mmol/L (ref 98–111)
Chloride: 103 mmol/L (ref 98–111)
Chloride: 103 mmol/L (ref 98–111)
Chloride: 103 mmol/L (ref 98–111)
Chloride: 103 mmol/L (ref 98–111)
Creatinine, Ser: 0.9 mg/dL (ref 0.44–1.00)
Creatinine, Ser: 0.9 mg/dL (ref 0.44–1.00)
Creatinine, Ser: 0.9 mg/dL (ref 0.44–1.00)
Creatinine, Ser: 0.8 mg/dL (ref 0.44–1.00)
Creatinine, Ser: 0.8 mg/dL (ref 0.44–1.00)
Glucose, Bld: 105 mg/dL — ABNORMAL HIGH (ref 70–99)
Glucose, Bld: 103 mg/dL — ABNORMAL HIGH (ref 70–99)
Glucose, Bld: 111 mg/dL — ABNORMAL HIGH (ref 70–99)
Glucose, Bld: 134 mg/dL — ABNORMAL HIGH (ref 70–99)
Glucose, Bld: 148 mg/dL — ABNORMAL HIGH (ref 70–99)
HCT: 37 % (ref 36.0–46.0)
HCT: 35 % — ABNORMAL LOW (ref 36.0–46.0)
HCT: 31 % — ABNORMAL LOW (ref 36.0–46.0)
HCT: 31 % — ABNORMAL LOW (ref 36.0–46.0)
HCT: 32 % — ABNORMAL LOW (ref 36.0–46.0)
Hemoglobin: 12.6 g/dL (ref 12.0–15.0)
Hemoglobin: 11.9 g/dL — ABNORMAL LOW (ref 12.0–15.0)
Hemoglobin: 10.5 g/dL — ABNORMAL LOW (ref 12.0–15.0)
Hemoglobin: 10.5 g/dL — ABNORMAL LOW (ref 12.0–15.0)
Hemoglobin: 10.9 g/dL — ABNORMAL LOW (ref 12.0–15.0)
Potassium: 3.8 mmol/L (ref 3.5–5.1)
Potassium: 3.9 mmol/L (ref 3.5–5.1)
Potassium: 4.4 mmol/L (ref 3.5–5.1)
Potassium: 3.8 mmol/L (ref 3.5–5.1)
Potassium: 4.5 mmol/L (ref 3.5–5.1)
Sodium: 142 mmol/L (ref 135–145)
Sodium: 141 mmol/L (ref 135–145)
Sodium: 143 mmol/L (ref 135–145)
Sodium: 140 mmol/L (ref 135–145)
Sodium: 141 mmol/L (ref 135–145)
TCO2: 26 mmol/L (ref 22–32)
TCO2: 25 mmol/L (ref 22–32)
TCO2: 27 mmol/L (ref 22–32)
TCO2: 24 mmol/L (ref 22–32)
TCO2: 27 mmol/L (ref 22–32)

## 2023-08-19 LAB — POCT I-STAT 7, (LYTES, BLD GAS, ICA,H+H)
Acid-Base Excess: 0 mmol/L (ref 0.0–2.0)
Acid-Base Excess: 1 mmol/L (ref 0.0–2.0)
Acid-Base Excess: 1 mmol/L (ref 0.0–2.0)
Acid-base deficit: 3 mmol/L — ABNORMAL HIGH (ref 0.0–2.0)
Acid-base deficit: 1 mmol/L (ref 0.0–2.0)
Acid-base deficit: 4 mmol/L — ABNORMAL HIGH (ref 0.0–2.0)
Acid-base deficit: 3 mmol/L — ABNORMAL HIGH (ref 0.0–2.0)
Bicarbonate: 26.2 mmol/L (ref 20.0–28.0)
Bicarbonate: 22 mmol/L (ref 20.0–28.0)
Bicarbonate: 26.6 mmol/L (ref 20.0–28.0)
Bicarbonate: 25.3 mmol/L (ref 20.0–28.0)
Bicarbonate: 26.6 mmol/L (ref 20.0–28.0)
Bicarbonate: 23.3 mmol/L (ref 20.0–28.0)
Bicarbonate: 23.6 mmol/L (ref 20.0–28.0)
Calcium, Ion: 1.22 mmol/L (ref 1.15–1.40)
Calcium, Ion: 0.93 mmol/L — ABNORMAL LOW (ref 1.15–1.40)
Calcium, Ion: 1.14 mmol/L — ABNORMAL LOW (ref 1.15–1.40)
Calcium, Ion: 1.11 mmol/L — ABNORMAL LOW (ref 1.15–1.40)
Calcium, Ion: 1.14 mmol/L — ABNORMAL LOW (ref 1.15–1.40)
Calcium, Ion: 1.15 mmol/L (ref 1.15–1.40)
Calcium, Ion: 1.17 mmol/L (ref 1.15–1.40)
HCT: 38 % (ref 36.0–46.0)
HCT: 28 % — ABNORMAL LOW (ref 36.0–46.0)
HCT: 33 % — ABNORMAL LOW (ref 36.0–46.0)
HCT: 30 % — ABNORMAL LOW (ref 36.0–46.0)
HCT: 31 % — ABNORMAL LOW (ref 36.0–46.0)
HCT: 36 % (ref 36.0–46.0)
HCT: 37 % (ref 36.0–46.0)
Hemoglobin: 12.9 g/dL (ref 12.0–15.0)
Hemoglobin: 11.2 g/dL — ABNORMAL LOW (ref 12.0–15.0)
Hemoglobin: 9.5 g/dL — ABNORMAL LOW (ref 12.0–15.0)
Hemoglobin: 10.2 g/dL — ABNORMAL LOW (ref 12.0–15.0)
Hemoglobin: 10.5 g/dL — ABNORMAL LOW (ref 12.0–15.0)
Hemoglobin: 12.2 g/dL (ref 12.0–15.0)
Hemoglobin: 12.6 g/dL (ref 12.0–15.0)
O2 Saturation: 100 %
O2 Saturation: 100 %
O2 Saturation: 100 %
O2 Saturation: 100 %
O2 Saturation: 93 %
O2 Saturation: 95 %
O2 Saturation: 95 %
Patient temperature: 36.5
Patient temperature: 37.5
Potassium: 3.8 mmol/L (ref 3.5–5.1)
Potassium: 4.2 mmol/L (ref 3.5–5.1)
Potassium: 4.3 mmol/L (ref 3.5–5.1)
Potassium: 3.8 mmol/L (ref 3.5–5.1)
Potassium: 4.4 mmol/L (ref 3.5–5.1)
Potassium: 4 mmol/L (ref 3.5–5.1)
Potassium: 4.5 mmol/L (ref 3.5–5.1)
Sodium: 141 mmol/L (ref 135–145)
Sodium: 140 mmol/L (ref 135–145)
Sodium: 142 mmol/L (ref 135–145)
Sodium: 140 mmol/L (ref 135–145)
Sodium: 142 mmol/L (ref 135–145)
Sodium: 141 mmol/L (ref 135–145)
Sodium: 141 mmol/L (ref 135–145)
TCO2: 28 mmol/L (ref 22–32)
TCO2: 23 mmol/L (ref 22–32)
TCO2: 28 mmol/L (ref 22–32)
TCO2: 27 mmol/L (ref 22–32)
TCO2: 28 mmol/L (ref 22–32)
TCO2: 25 mmol/L (ref 22–32)
TCO2: 25 mmol/L (ref 22–32)
pCO2 arterial: 48.2 mm[Hg] — ABNORMAL HIGH (ref 32–48)
pCO2 arterial: 40.2 mm[Hg] (ref 32–48)
pCO2 arterial: 46.6 mm[Hg] (ref 32–48)
pCO2 arterial: 46.1 mm[Hg] (ref 32–48)
pCO2 arterial: 47.8 mm[Hg] (ref 32–48)
pCO2 arterial: 48.3 mm[Hg] — ABNORMAL HIGH (ref 32–48)
pCO2 arterial: 46.2 mm[Hg] (ref 32–48)
pH, Arterial: 7.344 — ABNORMAL LOW (ref 7.35–7.45)
pH, Arterial: 7.345 — ABNORMAL LOW (ref 7.35–7.45)
pH, Arterial: 7.364 (ref 7.35–7.45)
pH, Arterial: 7.331 — ABNORMAL LOW (ref 7.35–7.45)
pH, Arterial: 7.368 (ref 7.35–7.45)
pH, Arterial: 7.288 — ABNORMAL LOW (ref 7.35–7.45)
pH, Arterial: 7.319 — ABNORMAL LOW (ref 7.35–7.45)
pO2, Arterial: 205 mm[Hg] — ABNORMAL HIGH (ref 83–108)
pO2, Arterial: 308 mm[Hg] — ABNORMAL HIGH (ref 83–108)
pO2, Arterial: 376 mm[Hg] — ABNORMAL HIGH (ref 83–108)
pO2, Arterial: 343 mm[Hg] — ABNORMAL HIGH (ref 83–108)
pO2, Arterial: 73 mm[Hg] — ABNORMAL LOW (ref 83–108)
pO2, Arterial: 81 mm[Hg] — ABNORMAL LOW (ref 83–108)
pO2, Arterial: 84 mm[Hg] (ref 83–108)

## 2023-08-19 LAB — CBC
HCT: 38.3 % (ref 36.0–46.0)
HCT: 36.7 % (ref 36.0–46.0)
HCT: 36.4 % (ref 36.0–46.0)
Hemoglobin: 13.2 g/dL (ref 12.0–15.0)
Hemoglobin: 12.3 g/dL (ref 12.0–15.0)
Hemoglobin: 12.3 g/dL (ref 12.0–15.0)
MCH: 30.9 pg (ref 26.0–34.0)
MCH: 30.9 pg (ref 26.0–34.0)
MCH: 30.6 pg (ref 26.0–34.0)
MCHC: 34.5 g/dL (ref 30.0–36.0)
MCHC: 33.5 g/dL (ref 30.0–36.0)
MCHC: 33.8 g/dL (ref 30.0–36.0)
MCV: 89.7 fL (ref 80.0–100.0)
MCV: 92.2 fL (ref 80.0–100.0)
MCV: 90.5 fL (ref 80.0–100.0)
Platelets: 365 10*3/uL (ref 150–400)
Platelets: 144 10*3/uL — ABNORMAL LOW (ref 150–400)
Platelets: 163 10*3/uL (ref 150–400)
RBC: 4.27 MIL/uL (ref 3.87–5.11)
RBC: 3.98 MIL/uL (ref 3.87–5.11)
RBC: 4.02 MIL/uL (ref 3.87–5.11)
RDW: 12.7 % (ref 11.5–15.5)
RDW: 13 % (ref 11.5–15.5)
RDW: 13.1 % (ref 11.5–15.5)
WBC: 7.2 10*3/uL (ref 4.0–10.5)
WBC: 17 10*3/uL — ABNORMAL HIGH (ref 4.0–10.5)
WBC: 15.9 10*3/uL — ABNORMAL HIGH (ref 4.0–10.5)
nRBC: 0 % (ref 0.0–0.2)
nRBC: 0 % (ref 0.0–0.2)
nRBC: 0 % (ref 0.0–0.2)

## 2023-08-19 LAB — PROTIME-INR
INR: 1.3 — ABNORMAL HIGH (ref 0.8–1.2)
Prothrombin Time: 16.5 s — ABNORMAL HIGH (ref 11.4–15.2)

## 2023-08-19 LAB — GLUCOSE, CAPILLARY
Glucose-Capillary: 105 mg/dL — ABNORMAL HIGH (ref 70–99)
Glucose-Capillary: 114 mg/dL — ABNORMAL HIGH (ref 70–99)
Glucose-Capillary: 123 mg/dL — ABNORMAL HIGH (ref 70–99)
Glucose-Capillary: 126 mg/dL — ABNORMAL HIGH (ref 70–99)
Glucose-Capillary: 133 mg/dL — ABNORMAL HIGH (ref 70–99)
Glucose-Capillary: 141 mg/dL — ABNORMAL HIGH (ref 70–99)
Glucose-Capillary: 142 mg/dL — ABNORMAL HIGH (ref 70–99)
Glucose-Capillary: 143 mg/dL — ABNORMAL HIGH (ref 70–99)
Glucose-Capillary: 145 mg/dL — ABNORMAL HIGH (ref 70–99)
Glucose-Capillary: 146 mg/dL — ABNORMAL HIGH (ref 70–99)
Glucose-Capillary: 149 mg/dL — ABNORMAL HIGH (ref 70–99)
Glucose-Capillary: 152 mg/dL — ABNORMAL HIGH (ref 70–99)

## 2023-08-19 LAB — HEMOGLOBIN AND HEMATOCRIT, BLOOD
HCT: 31.3 % — ABNORMAL LOW (ref 36.0–46.0)
Hemoglobin: 10.5 g/dL — ABNORMAL LOW (ref 12.0–15.0)

## 2023-08-19 LAB — POCT I-STAT EG7
Acid-base deficit: 2 mmol/L (ref 0.0–2.0)
Bicarbonate: 23.9 mmol/L (ref 20.0–28.0)
Calcium, Ion: 1.02 mmol/L — ABNORMAL LOW (ref 1.15–1.40)
HCT: 30 % — ABNORMAL LOW (ref 36.0–46.0)
Hemoglobin: 10.2 g/dL — ABNORMAL LOW (ref 12.0–15.0)
O2 Saturation: 73 %
Potassium: 4.2 mmol/L (ref 3.5–5.1)
Sodium: 141 mmol/L (ref 135–145)
TCO2: 25 mmol/L (ref 22–32)
pCO2, Ven: 43.4 mm[Hg] — ABNORMAL LOW (ref 44–60)
pH, Ven: 7.349 (ref 7.25–7.43)
pO2, Ven: 40 mm[Hg] (ref 32–45)

## 2023-08-19 LAB — APTT: aPTT: 34 s (ref 24–36)

## 2023-08-19 LAB — PLATELET COUNT: Platelets: 144 10*3/uL — ABNORMAL LOW (ref 150–400)

## 2023-08-19 LAB — PREPARE RBC (CROSSMATCH)

## 2023-08-19 LAB — MAGNESIUM: Magnesium: 2.6 mg/dL — ABNORMAL HIGH (ref 1.7–2.4)

## 2023-08-19 SURGERY — CORONARY ARTERY BYPASS GRAFTING (CABG)
Anesthesia: General | Site: Chest

## 2023-08-19 MED ORDER — FENTANYL CITRATE (PF) 250 MCG/5ML IJ SOLN
INTRAMUSCULAR | Status: AC
  Filled 2023-08-19: qty 5

## 2023-08-19 MED ORDER — MIDAZOLAM HCL 2 MG/2ML IJ SOLN: 2.0000 mg | INTRAMUSCULAR | Status: DC | PRN

## 2023-08-19 MED ORDER — LACTATED RINGERS IV SOLN: INTRAVENOUS | Status: DC

## 2023-08-19 MED ORDER — LACTATED RINGERS IV SOLN: INTRAVENOUS | Status: DC | PRN

## 2023-08-19 MED ORDER — HEPARIN SODIUM (PORCINE) 1000 UNIT/ML IJ SOLN
INTRAMUSCULAR | Status: AC
  Filled 2023-08-19: qty 1

## 2023-08-19 MED ORDER — PHENYLEPHRINE HCL-NACL 20-0.9 MG/250ML-% IV SOLN
INTRAVENOUS | Status: DC | PRN
  Administered 2023-08-19: 30 ug/min via INTRAVENOUS

## 2023-08-19 MED ORDER — PROPOFOL 10 MG/ML IV BOLUS
INTRAVENOUS | Status: DC | PRN
  Administered 2023-08-19: 20 mg via INTRAVENOUS
  Administered 2023-08-19: 120 mg via INTRAVENOUS

## 2023-08-19 MED ORDER — SODIUM CHLORIDE 0.9 % IV SOLN: INTRAVENOUS | Status: DC

## 2023-08-19 MED ORDER — ROCURONIUM BROMIDE 10 MG/ML (PF) SYRINGE
PREFILLED_SYRINGE | INTRAVENOUS | Status: AC
  Filled 2023-08-19: qty 30

## 2023-08-19 MED ORDER — DEXTROSE 50 % IV SOLN: 0.0000 mL | INTRAVENOUS | Status: DC | PRN

## 2023-08-19 MED ORDER — SODIUM CHLORIDE 0.9% FLUSH
10.0000 mL | Freq: Two times a day (BID) | INTRAVENOUS | Status: DC
  Administered 2023-08-19 – 2023-08-22 (×6): 10 mL via INTRAVENOUS

## 2023-08-19 MED ORDER — PHENYLEPHRINE HCL-NACL 20-0.9 MG/250ML-% IV SOLN
0.0000 ug/min | INTRAVENOUS | Status: DC
  Administered 2023-08-20: 20 ug/min via INTRAVENOUS
  Filled 2023-08-19: qty 250

## 2023-08-19 MED ORDER — ACETAMINOPHEN 160 MG/5ML PO SOLN: 1000.0000 mg | Freq: Four times a day (QID) | ORAL | Status: DC

## 2023-08-19 MED ORDER — CEFAZOLIN SODIUM-DEXTROSE 2-4 GM/100ML-% IV SOLN
2.0000 g | Freq: Three times a day (TID) | INTRAVENOUS | Status: AC
  Administered 2023-08-19 – 2023-08-21 (×6): 2 g via INTRAVENOUS
  Filled 2023-08-19 (×6): qty 100

## 2023-08-19 MED ORDER — HEMOSTATIC AGENTS (NO CHARGE) OPTIME
TOPICAL | Status: DC | PRN
  Administered 2023-08-19: 1 via TOPICAL

## 2023-08-19 MED ORDER — ALBUMIN HUMAN 5 % IV SOLN
INTRAVENOUS | Status: DC | PRN
Start: 2023-08-19 — End: 2023-08-19

## 2023-08-19 MED ORDER — PROTAMINE SULFATE 10 MG/ML IV SOLN
INTRAVENOUS | Status: AC
  Filled 2023-08-19: qty 25

## 2023-08-19 MED ORDER — PHENYLEPHRINE HCL (PRESSORS) 10 MG/ML IV SOLN
INTRAVENOUS | Status: DC | PRN
  Administered 2023-08-19: 80 ug via INTRAVENOUS

## 2023-08-19 MED ORDER — ALBUMIN HUMAN 5 % IV SOLN
250.0000 mL | INTRAVENOUS | Status: DC | PRN
  Administered 2023-08-19 (×2): 12.5 g via INTRAVENOUS

## 2023-08-19 MED ORDER — PROTAMINE SULFATE 10 MG/ML IV SOLN
INTRAVENOUS | Status: AC
  Filled 2023-08-19: qty 10

## 2023-08-19 MED ORDER — SODIUM CHLORIDE 0.9 % IV SOLN: 250.0000 mL | INTRAVENOUS | Status: DC

## 2023-08-19 MED ORDER — MAGNESIUM SULFATE 4 GM/100ML IV SOLN
4.0000 g | Freq: Once | INTRAVENOUS | Status: AC
  Administered 2023-08-19: 4 g via INTRAVENOUS
  Filled 2023-08-19: qty 100

## 2023-08-19 MED ORDER — 0.9 % SODIUM CHLORIDE (POUR BTL) OPTIME
TOPICAL | Status: DC | PRN
  Administered 2023-08-19: 5000 mL

## 2023-08-19 MED ORDER — SODIUM CHLORIDE (PF) 0.9 % IJ SOLN
INTRAMUSCULAR | Status: AC
  Filled 2023-08-19: qty 10

## 2023-08-19 MED ORDER — ONDANSETRON HCL 4 MG/2ML IJ SOLN
4.0000 mg | Freq: Four times a day (QID) | INTRAMUSCULAR | Status: DC | PRN
  Administered 2023-08-20 – 2023-08-23 (×7): 4 mg via INTRAVENOUS
  Filled 2023-08-19 (×9): qty 2

## 2023-08-19 MED ORDER — SODIUM BICARBONATE 8.4 % IV SOLN
50.0000 meq | Freq: Once | INTRAVENOUS | Status: AC
  Administered 2023-08-19: 50 meq via INTRAVENOUS

## 2023-08-19 MED ORDER — SODIUM CHLORIDE 0.9 % IV SOLN: INTRAVENOUS | Status: DC | PRN

## 2023-08-19 MED ORDER — ACETAMINOPHEN 500 MG PO TABS
1000.0000 mg | ORAL_TABLET | Freq: Four times a day (QID) | ORAL | Status: DC
  Administered 2023-08-19 – 2023-08-24 (×18): 1000 mg via ORAL
  Filled 2023-08-19 (×18): qty 2

## 2023-08-19 MED ORDER — SODIUM CHLORIDE (PF) 0.9 % IJ SOLN: OROMUCOSAL | Status: DC | PRN

## 2023-08-19 MED ORDER — PROPOFOL 10 MG/ML IV BOLUS
INTRAVENOUS | Status: AC
  Filled 2023-08-19: qty 20

## 2023-08-19 MED ORDER — MIDAZOLAM HCL (PF) 10 MG/2ML IJ SOLN
INTRAMUSCULAR | Status: AC
Start: 2023-08-19 — End: ?
  Filled 2023-08-19: qty 2

## 2023-08-19 MED ORDER — FENTANYL CITRATE PF 50 MCG/ML IJ SOSY
50.0000 ug | PREFILLED_SYRINGE | INTRAMUSCULAR | Status: DC | PRN
  Administered 2023-08-19: 100 ug via INTRAVENOUS
  Administered 2023-08-19: 50 ug via INTRAVENOUS
  Administered 2023-08-19 – 2023-08-20 (×10): 100 ug via INTRAVENOUS
  Administered 2023-08-21: 50 ug via INTRAVENOUS
  Administered 2023-08-21 (×2): 100 ug via INTRAVENOUS
  Filled 2023-08-19 (×11): qty 2
  Filled 2023-08-19: qty 1
  Filled 2023-08-19: qty 2
  Filled 2023-08-19: qty 1
  Filled 2023-08-19 (×2): qty 2

## 2023-08-19 MED ORDER — BISACODYL 5 MG PO TBEC
10.0000 mg | DELAYED_RELEASE_TABLET | Freq: Every day | ORAL | Status: DC
  Administered 2023-08-20 – 2023-08-23 (×4): 10 mg via ORAL
  Filled 2023-08-19 (×4): qty 2

## 2023-08-19 MED ORDER — HEPARIN SODIUM (PORCINE) 1000 UNIT/ML IJ SOLN
INTRAMUSCULAR | Status: DC | PRN
  Administered 2023-08-19: 2000 [IU] via INTRAVENOUS
  Administered 2023-08-19: 38000 [IU] via INTRAVENOUS

## 2023-08-19 MED ORDER — PROTAMINE SULFATE 10 MG/ML IV SOLN
INTRAVENOUS | Status: DC | PRN
  Administered 2023-08-19: 270 mg via INTRAVENOUS
  Administered 2023-08-19: 30 mg via INTRAVENOUS

## 2023-08-19 MED ORDER — CHLORHEXIDINE GLUCONATE CLOTH 2 % EX PADS
6.0000 | MEDICATED_PAD | Freq: Every day | CUTANEOUS | Status: DC
  Administered 2023-08-19 – 2023-08-21 (×3): 6 via TOPICAL

## 2023-08-19 MED ORDER — CHLORHEXIDINE GLUCONATE 0.12 % MT SOLN
15.0000 mL | OROMUCOSAL | Status: AC
  Administered 2023-08-19: 15 mL via OROMUCOSAL
  Filled 2023-08-19: qty 15

## 2023-08-19 MED ORDER — FENTANYL CITRATE (PF) 100 MCG/2ML IJ SOLN
INTRAMUSCULAR | Status: DC | PRN
  Administered 2023-08-19: 375 ug via INTRAVENOUS
  Administered 2023-08-19 (×2): 150 ug via INTRAVENOUS
  Administered 2023-08-19 (×3): 100 ug via INTRAVENOUS
  Administered 2023-08-19: 25 ug via INTRAVENOUS
  Administered 2023-08-19: 100 ug via INTRAVENOUS
  Administered 2023-08-19: 150 ug via INTRAVENOUS

## 2023-08-19 MED ORDER — SODIUM CHLORIDE 0.9% FLUSH
3.0000 mL | INTRAVENOUS | Status: DC | PRN
Start: 2023-08-20 — End: 2023-08-24

## 2023-08-19 MED ORDER — METOPROLOL TARTRATE 5 MG/5ML IV SOLN: 2.5000 mg | INTRAVENOUS | Status: DC | PRN

## 2023-08-19 MED ORDER — EPHEDRINE SULFATE-NACL 50-0.9 MG/10ML-% IV SOSY
PREFILLED_SYRINGE | INTRAVENOUS | Status: DC | PRN
  Administered 2023-08-19: 2.5 mg via INTRAVENOUS

## 2023-08-19 MED ORDER — ASPIRIN 325 MG PO TBEC
325.0000 mg | DELAYED_RELEASE_TABLET | Freq: Every day | ORAL | Status: DC
  Administered 2023-08-20 – 2023-08-22 (×3): 325 mg via ORAL
  Filled 2023-08-19 (×3): qty 1

## 2023-08-19 MED ORDER — EPHEDRINE 5 MG/ML INJ
INTRAVENOUS | Status: AC
  Filled 2023-08-19: qty 5

## 2023-08-19 MED ORDER — VANCOMYCIN HCL IN DEXTROSE 1-5 GM/200ML-% IV SOLN
1000.0000 mg | Freq: Once | INTRAVENOUS | Status: AC
  Administered 2023-08-19 (×2): 1000 mg via INTRAVENOUS
  Filled 2023-08-19: qty 200

## 2023-08-19 MED ORDER — CHLORHEXIDINE GLUCONATE 0.12 % MT SOLN: 15.0000 mL | Freq: Once | OROMUCOSAL | Status: DC

## 2023-08-19 MED ORDER — ORAL CARE MOUTH RINSE: 15.0000 mL | Freq: Once | OROMUCOSAL | Status: DC

## 2023-08-19 MED ORDER — DEXMEDETOMIDINE HCL IN NACL 400 MCG/100ML IV SOLN
INTRAVENOUS | Status: AC
  Filled 2023-08-19: qty 100

## 2023-08-19 MED ORDER — ROCURONIUM BROMIDE 100 MG/10ML IV SOLN
INTRAVENOUS | Status: DC | PRN
  Administered 2023-08-19: 50 mg via INTRAVENOUS
  Administered 2023-08-19: 20 mg via INTRAVENOUS
  Administered 2023-08-19: 70 mg via INTRAVENOUS
  Administered 2023-08-19: 50 mg via INTRAVENOUS
  Administered 2023-08-19: 30 mg via INTRAVENOUS

## 2023-08-19 MED ORDER — DOCUSATE SODIUM 100 MG PO CAPS
200.0000 mg | ORAL_CAPSULE | Freq: Every day | ORAL | Status: DC
  Administered 2023-08-20 – 2023-08-23 (×4): 200 mg via ORAL
  Filled 2023-08-19 (×4): qty 2

## 2023-08-19 MED ORDER — DEXMEDETOMIDINE HCL IN NACL 400 MCG/100ML IV SOLN
0.0000 ug/kg/h | INTRAVENOUS | Status: DC
  Administered 2023-08-19 (×2): 0.7 ug/kg/h via INTRAVENOUS
  Filled 2023-08-19: qty 100

## 2023-08-19 MED ORDER — NITROGLYCERIN 0.2 MG/ML ON CALL CATH LAB
INTRAVENOUS | Status: DC | PRN
  Administered 2023-08-19 (×2): 40 ug via INTRAVENOUS
  Administered 2023-08-19: 20 ug via INTRAVENOUS
  Administered 2023-08-19: 40 ug via INTRAVENOUS

## 2023-08-19 MED ORDER — METOPROLOL TARTRATE 25 MG/10 ML ORAL SUSPENSION
12.5000 mg | Freq: Two times a day (BID) | ORAL | Status: DC
  Filled 2023-08-19 (×6): qty 5

## 2023-08-19 MED ORDER — PANTOPRAZOLE SODIUM 40 MG IV SOLR
40.0000 mg | Freq: Every day | INTRAVENOUS | Status: AC
  Administered 2023-08-19 – 2023-08-20 (×2): 40 mg via INTRAVENOUS
  Filled 2023-08-19 (×2): qty 10

## 2023-08-19 MED ORDER — HEPARIN SODIUM (PORCINE) 1000 UNIT/ML IJ SOLN
INTRAMUSCULAR | Status: AC
  Filled 2023-08-19: qty 10

## 2023-08-19 MED ORDER — BISACODYL 10 MG RE SUPP: 10.0000 mg | Freq: Every day | RECTAL | Status: DC

## 2023-08-19 MED ORDER — PLASMA-LYTE A IV SOLN: INTRAVENOUS | Status: DC | PRN

## 2023-08-19 MED ORDER — POTASSIUM CHLORIDE 10 MEQ/50ML IV SOLN: 10.0000 meq | INTRAVENOUS | Status: AC

## 2023-08-19 MED ORDER — PHENYLEPHRINE 80 MCG/ML (10ML) SYRINGE FOR IV PUSH (FOR BLOOD PRESSURE SUPPORT)
PREFILLED_SYRINGE | INTRAVENOUS | Status: AC
  Filled 2023-08-19: qty 10

## 2023-08-19 MED ORDER — MIDAZOLAM HCL (PF) 5 MG/ML IJ SOLN
INTRAMUSCULAR | Status: DC | PRN
  Administered 2023-08-19 (×3): 2 mg via INTRAVENOUS
  Administered 2023-08-19: 1 mg via INTRAVENOUS
  Administered 2023-08-19: 2 mg via INTRAVENOUS
  Administered 2023-08-19: 1 mg via INTRAVENOUS

## 2023-08-19 MED ORDER — SODIUM CHLORIDE 0.9% FLUSH
3.0000 mL | Freq: Two times a day (BID) | INTRAVENOUS | Status: DC
  Administered 2023-08-20 – 2023-08-24 (×8): 3 mL via INTRAVENOUS

## 2023-08-19 MED ORDER — INSULIN REGULAR(HUMAN) IN NACL 100-0.9 UT/100ML-% IV SOLN
INTRAVENOUS | Status: DC
  Administered 2023-08-19: 4.4 [IU]/h via INTRAVENOUS
  Filled 2023-08-19 (×2): qty 100

## 2023-08-19 MED ORDER — NITROGLYCERIN IN D5W 200-5 MCG/ML-% IV SOLN: 0.0000 ug/min | INTRAVENOUS | Status: DC

## 2023-08-19 MED ORDER — SODIUM CHLORIDE 0.45 % IV SOLN
INTRAVENOUS | Status: DC | PRN
Start: 2023-08-19 — End: 2023-08-19

## 2023-08-19 MED ORDER — PANTOPRAZOLE SODIUM 40 MG PO TBEC
40.0000 mg | DELAYED_RELEASE_TABLET | Freq: Every day | ORAL | Status: DC
  Administered 2023-08-21 – 2023-08-24 (×4): 40 mg via ORAL
  Filled 2023-08-19 (×4): qty 1

## 2023-08-19 MED ORDER — TRAMADOL HCL 50 MG PO TABS
50.0000 mg | ORAL_TABLET | ORAL | Status: DC | PRN
Start: 2023-08-19 — End: 2023-08-24
  Administered 2023-08-19 – 2023-08-23 (×10): 100 mg via ORAL
  Filled 2023-08-19 (×10): qty 2

## 2023-08-19 MED ORDER — METOCLOPRAMIDE HCL 5 MG/ML IJ SOLN: 10.0000 mg | Freq: Four times a day (QID) | INTRAMUSCULAR | Status: DC

## 2023-08-19 MED ORDER — METOPROLOL TARTRATE 12.5 MG HALF TABLET
12.5000 mg | ORAL_TABLET | Freq: Two times a day (BID) | ORAL | Status: DC
  Administered 2023-08-20 – 2023-08-21 (×3): 12.5 mg via ORAL
  Filled 2023-08-19 (×7): qty 1

## 2023-08-19 MED ORDER — ASPIRIN 81 MG PO CHEW: 324.0000 mg | CHEWABLE_TABLET | Freq: Every day | ORAL | Status: DC

## 2023-08-19 MED ORDER — ASPIRIN 81 MG PO CHEW
324.0000 mg | CHEWABLE_TABLET | Freq: Once | ORAL | Status: AC
  Administered 2023-08-19: 324 mg via ORAL
  Filled 2023-08-19: qty 4

## 2023-08-19 MED ORDER — ACETAMINOPHEN 160 MG/5ML PO SOLN
650.0000 mg | Freq: Once | ORAL | Status: AC
  Administered 2023-08-19: 650 mg
  Filled 2023-08-19: qty 20.3

## 2023-08-19 MED ORDER — OXYCODONE HCL 5 MG PO TABS
5.0000 mg | ORAL_TABLET | ORAL | Status: DC | PRN
Start: 2023-08-19 — End: 2023-08-23
  Administered 2023-08-19 – 2023-08-23 (×16): 10 mg via ORAL
  Filled 2023-08-19 (×17): qty 2

## 2023-08-19 SURGICAL SUPPLY — 101 items
ADH SKN CLS APL DERMABOND .7 (GAUZE/BANDAGES/DRESSINGS) ×2
BAG DECANTER FOR FLEXI CONT (MISCELLANEOUS) ×2 IMPLANT
BLADE CLIPPER SURG (BLADE) ×2 IMPLANT
BLADE STERNUM SYSTEM 6 (BLADE) ×2 IMPLANT
BLADE SURG 11 STRL SS (BLADE) IMPLANT
BNDG CMPR 5X4 KNIT ELC UNQ LF (GAUZE/BANDAGES/DRESSINGS) ×2
BNDG CMPR 6 X 5 YARDS HK CLSR (GAUZE/BANDAGES/DRESSINGS) ×2
BNDG ELASTIC 4INX 5YD STR LF (GAUZE/BANDAGES/DRESSINGS) IMPLANT
BNDG ELASTIC 4X5.8 VLCR STR LF (GAUZE/BANDAGES/DRESSINGS) ×2 IMPLANT
BNDG ELASTIC 6INX 5YD STR LF (GAUZE/BANDAGES/DRESSINGS) IMPLANT
BNDG ELASTIC 6X5.8 VLCR STR LF (GAUZE/BANDAGES/DRESSINGS) ×2 IMPLANT
BNDG GAUZE DERMACEA FLUFF 4 (GAUZE/BANDAGES/DRESSINGS) ×2 IMPLANT
BNDG GZE DERMACEA 4 6PLY (GAUZE/BANDAGES/DRESSINGS) ×2
CANISTER SUCT 3000ML PPV (MISCELLANEOUS) ×2 IMPLANT
CANISTER WOUNDNEG PRESSURE 500 (CANNISTER) IMPLANT
CANNULA EZ GLIDE 8.0 24FR (CANNULA) IMPLANT
CANNULA EZ GLIDE AORTIC 21FR (CANNULA) ×2 IMPLANT
CANNULA MC2 2 STG 36/46 NON-V (CANNULA) IMPLANT
CATH CPB KIT HENDRICKSON (MISCELLANEOUS) ×2 IMPLANT
CATH ROBINSON RED A/P 18FR (CATHETERS) ×2 IMPLANT
CATH THORACIC 36FR (CATHETERS) ×2 IMPLANT
CATH THORACIC 36FR RT ANG (CATHETERS) ×2 IMPLANT
CLIP TI MEDIUM 24 (CLIP) IMPLANT
CLIP TI WIDE RED SMALL 24 (CLIP) IMPLANT
CONN Y 3/8X3/8X3/8 BEN (MISCELLANEOUS) IMPLANT
CONTAINER PROTECT SURGISLUSH (MISCELLANEOUS) ×4 IMPLANT
DERMABOND ADVANCED .7 DNX12 (GAUZE/BANDAGES/DRESSINGS) IMPLANT
DRAPE CARDIOVASCULAR INCISE (DRAPES) ×2
DRAPE SRG 135X102X78XABS (DRAPES) ×2 IMPLANT
DRAPE WARM FLUID 44X44 (DRAPES) ×2 IMPLANT
DRESSING PEEL AND PLAC PRVNA20 (GAUZE/BANDAGES/DRESSINGS) IMPLANT
DRSG COVADERM 4X14 (GAUZE/BANDAGES/DRESSINGS) ×2 IMPLANT
DRSG PEEL AND PLACE PREVENA 20 (GAUZE/BANDAGES/DRESSINGS) ×2
ELECT REM PT RETURN 9FT ADLT (ELECTROSURGICAL) ×4
ELECTRODE REM PT RTRN 9FT ADLT (ELECTROSURGICAL) ×4 IMPLANT
FELT TEFLON 1X6 (MISCELLANEOUS) ×4 IMPLANT
GAUZE 4X4 16PLY ~~LOC~~+RFID DBL (SPONGE) ×2 IMPLANT
GAUZE SPONGE 4X4 12PLY STRL (GAUZE/BANDAGES/DRESSINGS) ×4 IMPLANT
GLOVE SS BIOGEL STRL SZ 6 (GLOVE) IMPLANT
GLOVE SS BIOGEL STRL SZ 7.5 (GLOVE) ×2 IMPLANT
GLOVE SURG SIGNA 7.5 PF LTX (GLOVE) ×6 IMPLANT
GOWN STRL REUS W/ TWL LRG LVL3 (GOWN DISPOSABLE) ×8 IMPLANT
GOWN STRL REUS W/ TWL XL LVL3 (GOWN DISPOSABLE) ×4 IMPLANT
GOWN STRL REUS W/TWL LRG LVL3 (GOWN DISPOSABLE) ×8
GOWN STRL REUS W/TWL XL LVL3 (GOWN DISPOSABLE) ×4
HEMOSTAT POWDER SURGIFOAM 1G (HEMOSTASIS) ×6 IMPLANT
HEMOSTAT SURGICEL 2X14 (HEMOSTASIS) ×2 IMPLANT
INSERT FOGARTY XLG (MISCELLANEOUS) IMPLANT
KIT BASIN OR (CUSTOM PROCEDURE TRAY) ×2 IMPLANT
KIT DRSG PREVENA PLUS 7DAY 125 (MISCELLANEOUS) IMPLANT
KIT SUCTION CATH 14FR (SUCTIONS) ×4 IMPLANT
KIT TURNOVER KIT B (KITS) ×2 IMPLANT
KIT VASOVIEW HEMOPRO 2 VH 4000 (KITS) ×2 IMPLANT
LINE EXTENSION DELIVERY (MISCELLANEOUS) IMPLANT
MARKER GRAFT CORONARY BYPASS (MISCELLANEOUS) ×6 IMPLANT
NS IRRIG 1000ML POUR BTL (IV SOLUTION) ×10 IMPLANT
PACK E OPEN HEART (SUTURE) ×2 IMPLANT
PACK OPEN HEART (CUSTOM PROCEDURE TRAY) ×2 IMPLANT
PAD ARMBOARD 7.5X6 YLW CONV (MISCELLANEOUS) ×4 IMPLANT
PAD ELECT DEFIB RADIOL ZOLL (MISCELLANEOUS) ×2 IMPLANT
PENCIL BUTTON HOLSTER BLD 10FT (ELECTRODE) ×2 IMPLANT
POSITIONER HEAD DONUT 9IN (MISCELLANEOUS) ×2 IMPLANT
PUNCH AORTIC ROTATE 4.0MM (MISCELLANEOUS) IMPLANT
PUNCH AORTIC ROTATE 4.5MM 8IN (MISCELLANEOUS) IMPLANT
PUNCH AORTIC ROTATE 5MM 8IN (MISCELLANEOUS) IMPLANT
SET MPS 3-ND DEL (MISCELLANEOUS) IMPLANT
SPONGE T-LAP 18X18 ~~LOC~~+RFID (SPONGE) ×8 IMPLANT
SPONGE T-LAP 4X18 ~~LOC~~+RFID (SPONGE) ×2 IMPLANT
STOPCOCK 4 WAY LG BORE MALE ST (IV SETS) IMPLANT
SUPPORT HEART JANKE-BARRON (MISCELLANEOUS) ×2 IMPLANT
SUT BONE WAX W31G (SUTURE) ×2 IMPLANT
SUT MNCRL AB 4-0 PS2 18 (SUTURE) IMPLANT
SUT PROLENE 3 0 SH DA (SUTURE) ×2 IMPLANT
SUT PROLENE 4 0 RB 1 (SUTURE) ×4
SUT PROLENE 4 0 SH DA (SUTURE) IMPLANT
SUT PROLENE 4-0 RB1 .5 CRCL 36 (SUTURE) IMPLANT
SUT PROLENE 5 0 C 1 36 (SUTURE) IMPLANT
SUT PROLENE 6 0 C 1 30 (SUTURE) ×4 IMPLANT
SUT PROLENE 7 0 BV 1 (SUTURE) IMPLANT
SUT PROLENE 7 0 BV1 MDA (SUTURE) ×2 IMPLANT
SUT PROLENE 8 0 BV175 6 (SUTURE) IMPLANT
SUT STEEL 6MS V (SUTURE) ×2 IMPLANT
SUT STEEL STERNAL CCS#1 18IN (SUTURE) IMPLANT
SUT STEEL SZ 6 DBL 3X14 BALL (SUTURE) ×2 IMPLANT
SUT VIC AB 1 CTX 36 (SUTURE) ×4
SUT VIC AB 1 CTX36XBRD ANBCTR (SUTURE) ×4 IMPLANT
SUT VIC AB 2-0 CT1 27 (SUTURE) ×2
SUT VIC AB 2-0 CT1 TAPERPNT 27 (SUTURE) IMPLANT
SUT VIC AB 2-0 CTX 27 (SUTURE) IMPLANT
SUT VIC AB 3-0 SH 27 (SUTURE)
SUT VIC AB 3-0 SH 27X BRD (SUTURE) IMPLANT
SUT VIC AB 3-0 X1 27 (SUTURE) IMPLANT
SUT VICRYL 4-0 PS2 18IN ABS (SUTURE) IMPLANT
SYSTEM SAHARA CHEST DRAIN ATS (WOUND CARE) ×2 IMPLANT
TABLE PACK (MISCELLANEOUS) IMPLANT
TOWEL GREEN STERILE (TOWEL DISPOSABLE) ×2 IMPLANT
TOWEL GREEN STERILE FF (TOWEL DISPOSABLE) ×2 IMPLANT
TRAY FOLEY SLVR 16FR TEMP STAT (SET/KITS/TRAYS/PACK) ×2 IMPLANT
TUBING LAP HI FLOW INSUFFLATIO (TUBING) ×2 IMPLANT
UNDERPAD 30X36 HEAVY ABSORB (UNDERPADS AND DIAPERS) ×2 IMPLANT
WATER STERILE IRR 1000ML POUR (IV SOLUTION) ×4 IMPLANT

## 2023-08-19 NOTE — Transfer of Care (Signed)
Immediate Anesthesia Transfer of Care Note  Patient: Renee Ho  Procedure(s) Performed: CORONARY ARTERY BYPASS GRAFTING (CABG) X THREE, USING LEFT INTERNAL MAMMARY ARTERY AND RIGHT GREATER SAPHENEOUS VEIN HARVESTED ENDOSCOPICLY (Chest) TRANSESOPHAGEAL ECHOCARDIOGRAM  Patient Location: ICU  Anesthesia Type:General  Level of Consciousness: sedated and Patient remains intubated per anesthesia plan  Airway & Oxygen Therapy: Patient remains intubated per anesthesia plan and Patient placed on Ventilator (see vital sign flow sheet for setting)  Post-op Assessment: Report given to RN and Post -op Vital signs reviewed and stable  Post vital signs: Reviewed and stable  Last Vitals:  Vitals Value Taken Time  BP 99/57   Temp 36.6 C 08/19/23 1333  Pulse 80 08/19/23 1333  Resp 22 08/19/23 1333  SpO2 96 % 08/19/23 1333  Vitals shown include unfiled device data.  Last Pain:  Vitals:   08/19/23 0644  TempSrc:   PainSc: 8       Patients Stated Pain Goal: 1 (08/19/23 8295)  Complications:  Encounter Notable Events  Notable Event Outcome Phase Comment  Difficult to intubate - expected  Intraprocedure Filed from anesthesia note documentation.    Patient transported to ICU with standard monitors (HR, BP, SPO2, RR) and emergency drugs/equipment. Controlled ventilation maintained via ambu bag. Report given to bedside RN and respiratory therapist. Pt connected to ICU monitor and ventilator. All questions answered and vital signs stable before leaving

## 2023-08-19 NOTE — Interval H&P Note (Signed)
History and Physical Interval Note:  08/19/2023 7:12 AM  Renee Ho  has presented today for surgery, with the diagnosis of CAD.  The various methods of treatment have been discussed with the patient and family. After consideration of risks, benefits and other options for treatment, the patient has consented to  Procedure(s): CORONARY ARTERY BYPASS GRAFTING (CABG) (N/A) TRANSESOPHAGEAL ECHOCARDIOGRAM (N/A) as a surgical intervention.  The patient's history has been reviewed, patient examined, no change in status, stable for surgery.  I have reviewed the patient's chart and labs.  Questions were answered to the patient's satisfaction.     [name]

## 2023-08-19 NOTE — Brief Op Note (Addendum)
08/14/2023 - 08/19/2023  10:12 AM  PATIENT:  [name]  47 y.o. female  PRE-OPERATIVE DIAGNOSIS:  CORONARY ARTERY DISEASE  POST-OPERATIVE DIAGNOSIS:  CORONARY ARTERY DISEASE  PROCEDURE:  TRANSESOPHAGEAL ECHOCARDIOGRAM, CORONARY ARTERY BYPASS GRAFTING (CABG) x 4 (LIMA to LAD, SVG SEQUENTIALLY to DIAGONAL 2 and 1, SVG to PDA ) with EVH from right thigh   Vein harvest time: 19 min Vein prep time: 14 min  SURGEON:  Surgeons and Role:    [name], MD - Primary  PHYSICIAN ASSISTANT: Doree Fudge PA-C  ASSISTANTS: Gregor Hams    ANESTHESIA:   general  EBL:  Per anesthesia, perfusion record  DRAINS:  Chest tubes placed in the mediastinal and pleural spaces    COUNTS:  YES  DICTATION: .Dragon Dictation  PLAN OF CARE: Admit to inpatient   PATIENT DISPOSITION:  ICU - intubated and hemodynamically stable.   Delay start of Pharmacological VTE agent (>24hrs) due to surgical blood loss or risk of bleeding: no  BASELINE WEIGHT: 112.9 kg

## 2023-08-19 NOTE — Op Note (Signed)
Renee Ho, [name] MEDICAL RECORD NO: [id number] ACCOUNT NO: [id number] DATE OF BIRTH: 1976/03/27 FACILITY: [facility] LOCATION: [facility] PHYSICIAN: Salvatore Decent. Dorris Fetch, MD  Operative Report   DATE OF PROCEDURE: 08/19/2023  PREOPERATIVE DIAGNOSIS:  Coronary artery disease with unstable angina.  POSTOPERATIVE DIAGNOSIS:  Coronary artery disease with unstable angina.  PROCEDURES PERFORMED:   Median sternotomy, extracorporeal circulation,  Coronary artery bypass grafting x 4  Left internal mammary artery to the LAD,  Sequential saphenous vein graft to diagonals 2 and 1,  Saphenous vein graft to posterior descending).   Endoscopic vein harvest right thigh.  SURGEON:  Salvatore Decent. Dorris Fetch, MD  ASSISTANT: Doree Fudge, PA.    Experienced assistance was necessary for this case due to surgical complexity.  Doree Fudge, PA independently harvested the saphenous vein and closed the leg incisions.  She then assisted with exposure, retraction of delicate tissues, suctioning, and suture management during the anastomosis.   Valene Bors, RNFA assisted with cannulation and decannulation and wound closure.  ANESTHESIA:  General.  FINDINGS:  Good quality conduits, good quality targets, preserved left ventricular wall motion by transesophageal echocardiogram.  CLINICAL NOTE:  Renee Ho is a 47 year old morbidly obese woman who recently presented with ST-elevation MI.  She underwent emergent angioplasty and stenting of her circumflex.  She had residual LAD and right coronary artery disease.  Initially, the plan was to wait for 3 months prior to referring for coronary artery bypass grafting, but she presented back with recurrent unstable angina.  She was advised to undergo coronary artery bypass grafting.  The indications, risks, benefits, and alternatives were discussed in detail with the patient.  She understood and accepted the risks and agreed to proceed.  OPERATIVE  NOTE:  [name] was brought to the preoperative holding area on 08/19/2023.  The anesthesia service placed a Swan-Ganz catheter and an arterial blood pressure monitoring line.  She was taken to the operating room, anesthetized and intubated.  A  Foley catheter was placed.  Intravenous antibiotics were administered. Transesophageal echocardiogram was performed by Dr. Ruffin Pyo of Anesthesiology.  Please see his separately dictated note for full details of the procedure.  The chest, abdomen, and  legs were prepped and draped in the usual sterile fashion.  A time-out was performed.  A median sternotomy was performed.  The patient was morbidly obese and there was a very thick fat layer over the sternum.  The Rultract retractor was placed and the left internal mammary artery was harvested using standard technique.  Simultaneously, an incision was made in the medial aspect of the right leg at the level of the knee. The greater saphenous vein was identified and was harvested endoscopically from the right thigh.  Both the mammary artery and saphenous vein were good quality vessels.  2000 units of heparin was administered during the vessel harvest.  The remainder of the full heparin dose was given prior to opening the pericardium.  A sternal retractor was placed.  The pericardium was opened.  The ascending aorta was inspected.  There was no evidence of atherosclerotic disease.  It was a relatively short aorta with limited space to work because of the cardiomegaly, but there was  adequate room for the proximals.  After confirming adequate anticoagulation with ACT measurement, the aorta was cannulated via concentric 2-0 Ethibond pledgets pursestring sutures. A dual-stage venous cannula was placed via pursestring suture in the right atrial appendage.  Cardiopulmonary bypass was initiated.  The flows were maintained per protocol.  The patient was cooled  to 32 degrees Celsius.  The coronary arteries were inspected  and anastomotic sites were chosen.  The conduits were inspected and cut to length.  A foam pad was placed in the pericardium to insulate the heart.  The temperature probe was placed in the myocardial septum and a cardioplegia cannula was placed in the ascending aorta.  The aorta was cross clamped.  The left ventricle was emptied via the root vent.  Cardiac arrest then was achieved with a combination of cold antegrade blood cardioplegia and topical ice saline.  One liter of cardioplegia was administered.  There was a rapid diastolic arrest and there was septal cooling to 12 degrees Celsius.  A reversed saphenous vein graft was placed end to side to the posterior descending branch of the right coronary.  It was a 1.5 mm good quality target.  The vein was of good quality.  It was anastomosed end to side with a running 7-0 Prolene suture.  All anastomoses were probed proximally and distally at their completion to ensure patency prior to tying the suture.  Cardioplegia was administered down the graft and there was good flow and good hemostasis.  Next, a reversed saphenous vein graft was placed sequentially to the second and first diagonal branches of the LAD.  These were both 1.5 mm vessels, both good quality at the side of the anastomosis.  A side-to-side anastomosis was performed to the first diagonal and an end to side to the second diagonal.  Both were done with running 7-0 Prolene sutures.  There was good flow and good hemostasis with cardioplegia administration.   The left internal mammary artery was brought through a window in the pericardium.  The distal end was beveled.  It was anastomosed end to side of the distal LAD.  These were both 1.5 mm good quality vessels.  The anastomosis was performed with a running 8-0 Prolene suture.  At the completion of the anastomosis, the Bulldog clamp was briefly removed to inspect for hemostasis.  Septal rewarming was noted.  The bulldog clamp was replaced and the  mammary pedicle was taken to the epicardial surface of the heart with 6-0 Prolene sutures.  Additional cardioplegia was administered.  The cardioplegia cannula was removed from the ascending aorta.  The proximal vein graft anastomoses was performed with 4.5-mm punch aortotomies with running 6-0 Prolene suture.  At the completion of the final proximal anastomosis, the patient was placed in Trendelenburg position.  Lidocaine was administered.  The aortic root was deaired and the aortic cross clamp was removed.  The total cross clamp time was 59 minutes.  While rewarming was completed, all proximal and distal anastomosis were inspected for hemostasis. Epicardial pacing wires were placed on the right ventricle and right atrium.  When the patient had rewarmed to a core temperature of 37 degrees Celsius, she was weaned from cardiopulmonary bypass on the first attempt.  She was atrially paced for rate and on no inotropic support at the time of separation from bypass.  The total bypass time was 93 minutes.  Post bypass, transesophageal echocardiography was unchanged from the pre-bypass study.  A test dose of protamine was administered and was well tolerated.  The atrial and aortic cannulae were removed.  The remainder of the protamine was administered without incident.  The chest was copiously irrigated with warm saline.  Hemostasis was achieved.  The pericardium was reapproximated over the ascending aorta and base of the heart with interrupted 3-0 silk suture.  Left pleural and mediastinal  chest tubes were placed in separate subcostal incisions.  The sternum was closed with a combination of single and double heavy gauge stainless steel wires.  The patient tolerated sternal closure well hemodynamically.  The pectoralis fascia and subcutaneous tissue and skin were closed in standard fashion.  A Prevena wound VAC was placed over the sternal wound.  The chest tubes were placed to a Pleura Vac on suction.  All sponge,  needle, and instrument counts were correct at the end of the procedure.  The patient was transported from the operating room to the surgical intensive care unit, intubated and in fair condition.   MUK D: 08/19/2023 5:26:57 pm T: 08/19/2023 10:06:00 pm  JOB: [id number]/ [id number]

## 2023-08-19 NOTE — Anesthesia Procedure Notes (Signed)
Procedure Name: Intubation Date/Time: 08/19/2023 8:05 AM  Performed by: Alease Medina, CRNAPre-anesthesia Checklist: Patient identified, Emergency Drugs available, Suction available and Patient being monitored Patient Re-evaluated:Patient Re-evaluated prior to induction Oxygen Delivery Method: Circle system utilized Preoxygenation: Pre-oxygenation with 100% oxygen Induction Type: IV induction Ventilation: Mask ventilation without difficulty, Two handed mask ventilation required and Oral airway inserted - appropriate to patient size Laryngoscope Size: Glidescope and 3 Grade View: Grade I Tube type: Oral Tube size: 7.5 mm Number of attempts: 1 Airway Equipment and Method: Stylet and Oral airway Placement Confirmation: ETT inserted through vocal cords under direct vision, positive ETCO2 and breath sounds checked- equal and bilateral Secured at: 22 cm Tube secured with: Tape Dental Injury: Teeth and Oropharynx as per pre-operative assessment  Difficulty Due To: Difficulty was anticipated and Difficult Airway- due to large tongue

## 2023-08-19 NOTE — Anesthesia Procedure Notes (Signed)
Central Venous Catheter Insertion Performed by: Achille Rich, MD, anesthesiologist Start/End11/05/2023 7:02 AM Patient location: Pre-op. Preanesthetic checklist: patient identified, IV checked, site marked, risks and benefits discussed, surgical consent, monitors and equipment checked, pre-op evaluation, timeout performed and anesthesia consent Lidocaine 1% used for infiltration and patient sedated Hand hygiene performed  and maximum sterile barriers used  Catheter size: 8.5 Fr Sheath introducer Procedure performed using ultrasound guided technique. Ultrasound Notes:anatomy identified, needle tip was noted to be adjacent to the nerve/plexus identified, no ultrasound evidence of intravascular and/or intraneural injection and image(s) printed for medical record Attempts: 1 Following insertion, line sutured and dressing applied. Post procedure assessment: blood return through all ports, free fluid flow and no air  Patient tolerated the procedure well with no immediate complications.

## 2023-08-19 NOTE — Discharge Instructions (Signed)
Discharge Instructions:  1. You may shower, please wash incisions daily with soap and water and keep dry.  If you wish to cover wounds with dressing you may do so but please keep clean and change daily.  No tub baths or swimming until incisions have completely healed.  If your incisions become red or develop any drainage please call our office at [phone]  2. No Driving until cleared by Dr. Sunday Corn office and you are no longer using narcotic pain medications  3. Monitor your weight daily.. Please use the same scale and weigh at same time... If you gain 5-10 lbs in 48 hours with associated lower extremity swelling, please contact our office at [phone]  4. Fever of 101.5 for at least 24 hours with no source, please contact our office at [phone]  5. Activity- up as tolerated, please walk at least 3 times per day.  Avoid strenuous activity, no lifting, pushing, or pulling with your arms over 8-10 lbs for a minimum of 6 weeks  6. If any questions or concerns arise, please do not hesitate to contact our office at [phone]

## 2023-08-19 NOTE — Anesthesia Preprocedure Evaluation (Signed)
Anesthesia Evaluation  Patient identified by MRN, date of birth, ID band Patient awake    Reviewed: Allergy & Precautions, H&P , NPO status , Patient's Chart, lab work & pertinent test results  Airway Mallampati: II   Neck ROM: full    Dental   Pulmonary sleep apnea , Current Smoker   breath sounds clear to auscultation       Cardiovascular hypertension, + CAD, + Past MI and + Cardiac Stents   Rhythm:regular Rate:Normal  Normal LV function. Moderate asymmetric hypertrophy of basal septal segment.   Neuro/Psych  PSYCHIATRIC DISORDERS  Depression       GI/Hepatic   Endo/Other  diabetes, Type 2  Morbid obesity  Renal/GU      Musculoskeletal   Abdominal   Peds  Hematology   Anesthesia Other Findings   Reproductive/Obstetrics                             Anesthesia Physical Anesthesia Plan  ASA: 3  Anesthesia Plan: General   Post-op Pain Management:    Induction: Intravenous  PONV Risk Score and Plan: 2 and Ondansetron, Dexamethasone, Midazolam and Treatment may vary due to age or medical condition  Airway Management Planned: Oral ETT  Additional Equipment: Arterial line, CVP, PA Cath, TEE and Ultrasound Guidance Line Placement  Intra-op Plan:   Post-operative Plan: Extubation in OR  Informed Consent: I have reviewed the patients History and Physical, chart, labs and discussed the procedure including the risks, benefits and alternatives for the proposed anesthesia with the patient or authorized representative who has indicated his/her understanding and acceptance.     Dental advisory given  Plan Discussed with: CRNA, Anesthesiologist and Surgeon  Anesthesia Plan Comments:        Anesthesia Quick Evaluation

## 2023-08-19 NOTE — Procedures (Signed)
Extubation Procedure Note  Patient Details:   Name: Renee Ho DOB: 05-06-1976 MRN: [id number]   Airway Documentation:    Vent end date: 08/19/23 Vent end time: 1659   Evaluation  O2 sats: stable throughout Complications: No apparent complications Patient did tolerate procedure well. Bilateral Breath Sounds: Clear   Yes  Patient extubated per order and protocol to 4L [place] with no apparent complications. Positive cuff leak was noted prior to extubation. Patient achieved NIF of greater than -40 and VC of .9L with good effort. Patient is sleepy but oriented, has strong cough, and is able to weakly speak. Vitals are stable. RT will continue to monitor.   Gertrue Willette Lajuana Ripple 08/19/2023, 5:08 PM

## 2023-08-19 NOTE — Progress Notes (Signed)
  Echocardiogram Echocardiogram Transesophageal has been performed.  Janalyn Harder 08/19/2023, 8:35 AM

## 2023-08-19 NOTE — Anesthesia Procedure Notes (Signed)
Arterial Line Insertion Start/End11/05/2023 7:23 AM Performed by: Alease Medina, CRNA, CRNA  Patient location: Pre-op. Preanesthetic checklist: patient identified, IV checked, site marked, risks and benefits discussed, surgical consent, monitors and equipment checked, pre-op evaluation, timeout performed and anesthesia consent Lidocaine 1% used for infiltration Left, radial was placed Catheter size: 20 G Hand hygiene performed  and maximum sterile barriers used   Attempts: 1 Procedure performed without using ultrasound guided technique. Following insertion, dressing applied and Biopatch. Post procedure assessment: normal and unchanged  Patient tolerated the procedure well with no immediate complications.

## 2023-08-19 NOTE — Discharge Summary (Signed)
[address]       [place] [address]             [phone]    Physician Discharge Summary  Patient ID: Renee Ho MRN: [id number] DOB/AGE: [birth year] 46 y.o.  Admit date: 08/14/2023 Discharge date: 08/24/2023  Admission Diagnoses:  Patient Active Problem List   Diagnosis Date Noted   Coronary artery disease 08/19/2023   Elevated brain natriuretic peptide (BNP) level 08/16/2023   Exertional dyspnea 08/15/2023   Angina pectoris (HCC) 08/14/2023   Unstable angina (HCC) 08/14/2023   S/P drug eluting coronary stent placement 07/24/2023   Coronary artery disease involving native coronary artery of native heart with unstable angina pectoris (HCC) 07/24/2023   Acute coronary syndrome (HCC) 07/23/2023   DM2 (diabetes mellitus, type 2) (HCC) 07/23/2023   Dyslipidemia 07/23/2023   Hypertriglyceridemia 07/23/2023   NSTEMI (non-ST elevated myocardial infarction) (HCC) 07/23/2023   Leukocytosis 07/23/2023   Polycythemia 07/23/2023   Coronary atherosclerosis due to calcified coronary lesion 07/23/2023   Atherosclerosis of aorta (HCC) 07/23/2023   Cigarette smoker 07/23/2023   Eye pain, bilateral 03/22/2023   Visual changes 03/22/2023   Bacterial conjunctivitis 03/22/2023   Breast pain, right 12/02/2022   Cutaneous candidiasis 12/02/2022   Acute pancreatitis 06/19/2022   Class 3 severe obesity due to excess calories with serious comorbidity and body mass index (BMI) of 40.0 to 44.9 in adult (HCC) 06/19/2022   Tobacco abuse 06/19/2022   HLD (hyperlipidemia) 04/14/2020   Lumbar radiculopathy 08/09/2019   Hyperglycemia 12/22/2016   Injury of foot, left 07/18/2013   Hypertension associated with diabetes (HCC) 09/18/2012   Morbid obesity (HCC) 09/18/2012   S/P hysterectomy 06/20/2012   H/O colonoscopy with polypectomy 06/20/2012   Depression 12/30/2011   Plantar fasciitis 04/22/2011   Pure hypertriglyceridemia 02/21/2008   Type 2 diabetes  mellitus with hyperglycemia, with long-term current use of insulin (HCC) 02/24/2007   POLYCYSTIC OVARIAN DISEASE 02/24/2007   Essential hypertension 02/24/2007   OSA on CPAP 02/24/2007     Discharge Diagnoses:  Patient Active Problem List   Diagnosis Date Noted   Coronary artery disease 08/19/2023   Elevated brain natriuretic peptide (BNP) level 08/16/2023   Exertional dyspnea 08/15/2023   Angina pectoris (HCC) 08/14/2023   Unstable angina (HCC) 08/14/2023   S/P drug eluting coronary stent placement 07/24/2023   Coronary artery disease involving native coronary artery of native heart with unstable angina pectoris (HCC) 07/24/2023   Acute coronary syndrome (HCC) 07/23/2023   DM2 (diabetes mellitus, type 2) (HCC) 07/23/2023   Dyslipidemia 07/23/2023   Hypertriglyceridemia 07/23/2023   NSTEMI (non-ST elevated myocardial infarction) (HCC) 07/23/2023   Leukocytosis 07/23/2023   Polycythemia 07/23/2023   Coronary atherosclerosis due to calcified coronary lesion 07/23/2023   Atherosclerosis of aorta (HCC) 07/23/2023   Cigarette smoker 07/23/2023   Eye pain, bilateral 03/22/2023   Visual changes 03/22/2023   Bacterial conjunctivitis 03/22/2023   Breast pain, right 12/02/2022   Cutaneous candidiasis 12/02/2022   Acute pancreatitis 06/19/2022   Class 3 severe obesity due to excess calories with serious comorbidity and body mass index (BMI) of 40.0 to 44.9 in adult (HCC) 06/19/2022   Tobacco abuse 06/19/2022   HLD (hyperlipidemia) 04/14/2020   Lumbar radiculopathy 08/09/2019   Hyperglycemia 12/22/2016   Injury of foot, left 07/18/2013   Hypertension associated with diabetes (HCC) 09/18/2012   Morbid obesity (HCC) 09/18/2012   S/P hysterectomy 06/20/2012   H/O colonoscopy with polypectomy 06/20/2012   Depression 12/30/2011  Plantar fasciitis 04/22/2011   Pure hypertriglyceridemia 02/21/2008   Type 2 diabetes mellitus with hyperglycemia, with long-term current use of insulin (HCC)  02/24/2007   POLYCYSTIC OVARIAN DISEASE 02/24/2007   Essential hypertension 02/24/2007   OSA on CPAP 02/24/2007     Discharged Condition: Stable  HPI: his is a 47 year old female with a past medical history of NSTEMI (Oct 2024), coronary artery disease, hypertension, diabetes mellitus, OSA, hyperlipidemia, morbid obesity, tobacco abuse (recently quit 07/23/2023), and anxiety and depression who presented to ED with complaints of chest pain to the ED on 08/14/2023. She has also had shortness of breath with chest pain. EMS gave ec asa and 2 Nitroglycerin (did not provide relief). EKG showed no acute ischemic changes. Troponin I (high sensitivity) max was 12 and BNP 119.4.   She was recently hospitalized from 07/23/2023 thru 07/27/2023. She was found to have a NSTEMI and underwent PTCA/DES to proximal Circumflex. Cardiac catheterization done 07/23/2023 showed proximal to mid Circumflex 100% stenosed, proximal to mid LAD 70% stenosed, Diagonal 2 90% stenosed, and mid RCA 60% stenosed.  Of note, mLAD disease did not seem amenable to PCI and a stent would potentially jeopardized flow to the diagonal branches which are moderate to large in caliber. The plan was that she was discharged on ec asa and Brilinta and these would be continued for 3 months. She would then be referred to [facility] to consider coronary artery bypass grafting surgery.   She was seen in the cardiology office on 08/12/2023. Patient has made several lifestyle modifications (stopped smoking, no red meat or fried foods, intentional weight loss etc). She did report increased shortness of breath and exercise intolerance since her heart attack. 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 Her anxiety has also been increased with regards to her probable need for future cardiac surgery. Arrangements were going to made for her to see [facility] but she  presented to the ED in the interim.  Dr. Dorris Fetch discussed the need for coronary artery bypass grafting surgery. Potential risks, benefits, and complications of the surgery were discussed with the patient and she agreed to proceed with surgery. Duplex carotid US showed no significant internal carotid artery stenosis bilaterally.  Hospital Course: Patient underwent a CABG x 4. She was transported from the OR to [facility] ICU in stable condition. She was extubated the evening of surgery.  Vital signs and hemodynamics remained stable.  Drain lines were removed on postop day 1.  She was mobilized in the ICU.  Diet was begun and advanced without difficulty.  Epicardial pacing wires were removed on postop day 2.  She remained stable.  Orders were placed for transfer to progressive care.  The patient remained in NSR.  Her blood pressure was low and was prohibitive of diuresis.  She was evaluated by PT/OT who recommended inpatient rehab.  A referral was made to the inpatient rehab team who saw the patient in consult on post-op day 4. She was felt to be an appropriate candidate for admission to the CIR so the insurance approval process was initiated. Rehab continued with the physical therapy and occupational teams. She has been tolerating a diet and has had a bowel movement. All wounds are clean, dry, healing without signs of infection. She had a run of SVT 11/12. Cardiology evaluated and started her on oral Amiodarone. This will be titrated down at discharge. Lopressor remained at 12.5  mg bid secondary to BP. She continued to maintain sinus rhythm. She has been using CPAP at night (OSA) and has been oxygenating well on room air. Prevena was removed the day of discharge. She was felt to be an appropriate candidate for admission to the CIR so the insurance approval process was initiated.    Consults: cardiology  Significant Diagnostic Studies:   Narrative & Impression  CLINICAL DATA:  Follow-up atelectasis    EXAM: CHEST - 2 VIEW   COMPARISON:  08/21/2023, 08/20/2023, 08/15/2023   FINDINGS: Post sternotomy changes. Small bilateral pleural effusions. Enlarged cardiomediastinal silhouette with mild central congestion. Subsegmental atelectasis in the left lung base. Removal of lower chest drainage catheters. No convincing pneumothorax is seen. Removal of right IJ catheter sheath.   IMPRESSION: 1. Subsegmental atelectasis in the left lower lung. Removal of right IJ catheter sheath and chest drainage tubes. 2. Enlarged cardiomediastinal silhouette with mild central congestion and small pleural effusions.     Electronically Signed   By: Jasmine Pang M.D.   On: 08/22/2023 15:11    Treatments: surgery:  Median sternotomy, extracorporeal circulation, coronary artery bypass grafting x 4 (left internal mammary artery to the LAD, sequential saphenous vein graft to diagonals 2 and 1, saphenous vein graft to posterior descending) with endoscopic vein harvest right thigh by Dr. Dorris Fetch on 08/19/2023.  Discharge Exam: Cardiovascular: RRR Pulmonary: Slightly diminished bibasilar breath sounds Abdomen: Soft, non tender, bowel sounds present. Extremities: Mild bilateral lower extremity edema. Wounds: Prevena removed and sternal wound is clean and dry. RLE wound is clean and dry.  No erythema or signs of infection.  Discharge Medications:  The patient has been discharged on:   1.Beta Blocker:  Yes [  x ]                              No   [   ]                              If No, reason:  2.Ace Inhibitor/ARB: Yes [   ]                                     No  [  x  ]                                     If No, reason:Labile BP  3.Statin:   Yes [  x ]                  No  [   ]                  If No, reason:  4.Ecasa:  Yes  [ x  ]                  No   [   ]                  If No, reason:  Patient had ACS upon admission:  Plavix/P2Y12 inhibitor: Yes [  x ]  No  [   ]     Discharge Instructions     Amb Referral to Cardiac Rehabilitation   Complete by: As directed    Diagnosis: CABG   CABG X ___: 4   After initial evaluation and assessments completed: Virtual Based Care may be provided alone or in conjunction with Phase 2 Cardiac Rehab based on patient barriers.: Yes   Intensive Cardiac Rehabilitation (ICR) [facility] location only OR Traditional Cardiac Rehabilitation (TCR) *If criteria for ICR are not met will enroll in TCR [facility] only): Yes      Allergies as of 08/24/2023       Reactions   Morphine And Codeine Other (See Comments)   Migraines   Reglan [metoclopramide] Other (See Comments)   Psychotic episodes   Chantix [varenicline] Other (See Comments)   Makes the patient angry feeling   Toradol [ketorolac Tromethamine] Rash        Medication List     STOP taking these medications    hydrochlorothiazide 25 MG tablet Commonly known as: HYDRODIURIL   isosorbide mononitrate 30 MG 24 hr tablet Commonly known as: IMDUR   lisinopril 5 MG tablet Commonly known as: ZESTRIL   ticagrelor 90 MG Tabs tablet Commonly known as: BRILINTA       TAKE these medications    amiodarone 200 MG tablet Commonly known as: PACERONE Take 400 mg bid for 5 days;then take 200 mg bid for 7 days;then take 200 mg daily thereafter   Aspirin Low Dose 81 MG tablet Generic drug: aspirin EC Take 1 tablet (81 mg total) by mouth daily. Swallow whole.   atorvastatin 80 MG tablet Commonly known as: LIPITOR Take 1 tablet (80 mg total) by mouth daily.   clopidogrel 75 MG tablet Commonly known as: PLAVIX Take 1 tablet (75 mg total) by mouth daily.   DULoxetine 30 MG capsule Commonly known as: CYMBALTA Take 3 capsules (90 mg total) by mouth daily.   ezetimibe 10 MG tablet Commonly known as: ZETIA Take 1 tablet (10 mg total) by mouth daily.   Farxiga 10 MG Tabs tablet Generic drug: dapagliflozin propanediol Take 1 tablet (10 mg  total) by mouth daily.   fenofibrate 160 MG tablet Take 1 tablet (160 mg total) by mouth daily.   furosemide 40 MG tablet Commonly known as: LASIX Take 1 tablet (40 mg total) by mouth daily. Start taking on: August 25, 2023   icosapent Ethyl 1 g capsule Commonly known as: VASCEPA Take 2 capsules (2 g total) by mouth 2 (two) times daily.   insulin glargine-yfgn 100 UNIT/ML Pen Commonly known as: Semglee (yfgn) Inject 60 Units into the skin at bedtime. What changed: how much to take   metFORMIN 1000 MG tablet Commonly known as: GLUCOPHAGE Take 1 tablet (1,000 mg total) by mouth 2 (two) times daily with a meal.   metoprolol tartrate 25 MG tablet Commonly known as: LOPRESSOR Take 0.5 tablets (12.5 mg total) by mouth 2 (two) times daily. What changed: how much to take   oxyCODONE 5 MG immediate release tablet Commonly known as: Oxy IR/ROXICODONE Take 1 tablet (5 mg total) by mouth every 6 (six) hours as needed for severe pain (pain score 7-10).   potassium chloride SA 20 MEQ tablet Commonly known as: KLOR-CON M Take 1 tablet (20 mEq total) by mouth daily.   PRESCRIPTION MEDICATION See admin instructions. CPAP- CONTINUOUS   TechLite Pen Needles 31G X 8 MM Misc Generic drug: Insulin Pen Needle Use as directed  tirzepatide 7.5 MG/0.5ML Pen Commonly known as: MOUNJARO Inject 7.5 mg into the skin once a week. Start taking on: September 12, 2023 What changed: These instructions start on September 12, 2023. If you are unsure what to do until then, ask your doctor or other care provider.        Follow-up Information     [facility] Follow up on 09/20/2023.   Why: Please get CXR at 12:30 prior to your appointment with Dr. Joneen Roach information: [address] [place] [address]        [name], MD Follow up on 09/20/2023.   Specialty: Cardiothoracic Surgery Why: Appointment is at 1:30 Contact information: [address]  [employer] [address] [place] [address] [phone]         Tessa Lerner, DO. Go on 09/20/2023.   Specialties: Cardiology, Vascular Surgery Why: Appointment time is at 8:30 am Contact information: [address] [place] [address] [phone]                 Signed:  Ardelle Balls, Cordelia Poche 08/24/2023, 3:40 PM

## 2023-08-19 NOTE — Hospital Course (Addendum)
HPI: his is a 47 year old female with a past medical history of NSTEMI (Oct 2024), coronary artery disease, hypertension, diabetes mellitus, OSA, hyperlipidemia, morbid obesity, tobacco abuse (recently quit 07/23/2023), and anxiety and depression who presented to ED with complaints of chest pain to the ED on 08/14/2023. She has also had shortness of breath with chest pain. EMS gave ec asa and 2 Nitroglycerin (did not provide relief). EKG showed no acute ischemic changes. Troponin I (high sensitivity) max was 12 and BNP 119.4.   She was recently hospitalized from 07/23/2023 thru 07/27/2023. She was found to have a NSTEMI and underwent PTCA/DES to proximal Circumflex. Cardiac catheterization done 07/23/2023 showed proximal to mid Circumflex 100% stenosed, proximal to mid LAD 70% stenosed, Diagonal 2 90% stenosed, and mid RCA 60% stenosed.  Of note, mLAD disease did not seem amenable to PCI and a stent would potentially jeopardized flow to the diagonal branches which are moderate to large in caliber. The plan was that she was discharged on ec asa and Brilinta and these would be continued for 3 months. She would then be referred to [facility] to consider coronary artery bypass grafting surgery.   She was seen in the cardiology office on 08/12/2023. Patient has made several lifestyle modifications (stopped smoking, no red meat or fried foods, intentional weight loss etc). She did report increased shortness of breath and exercise intolerance since her heart attack. 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 Her anxiety has also been increased with regards to her probable need for future cardiac surgery. Arrangements were going to made for her to see [facility] but she presented to the ED in the interim.  Dr. Dorris Fetch discussed the need for coronary artery bypass grafting surgery. Potential risks, benefits, and  complications of the surgery were discussed with the patient and she agreed to proceed with surgery. Duplex carotid US showed no significant internal carotid artery stenosis bilaterally.  Hospital Course: Patient underwent a CABG x 4. She was transported from the OR to [facility] ICU in stable condition. She was extubated the evening of surgery.

## 2023-08-19 NOTE — Anesthesia Procedure Notes (Signed)
Central Venous Catheter Insertion Performed by: Achille Rich, MD, anesthesiologist Start/End11/05/2023 7:15 AM, 08/19/2023 7:18 AM Patient location: Pre-op. Preanesthetic checklist: patient identified, IV checked, site marked, risks and benefits discussed, surgical consent, monitors and equipment checked, pre-op evaluation, timeout performed and anesthesia consent Hand hygiene performed  and maximum sterile barriers used  PA cath was placed.Swan type:thermodilution Procedure performed without using ultrasound guided technique. Attempts: 1 Patient tolerated the procedure well with no immediate complications.

## 2023-08-20 ENCOUNTER — Inpatient Hospital Stay (HOSPITAL_COMMUNITY): Payer: 59

## 2023-08-20 ENCOUNTER — Encounter (HOSPITAL_COMMUNITY): Payer: Self-pay | Admitting: Thoracic Surgery (Cardiothoracic Vascular Surgery)

## 2023-08-20 DIAGNOSIS — I209 Angina pectoris, unspecified: Secondary | ICD-10-CM

## 2023-08-20 LAB — BASIC METABOLIC PANEL
Anion gap: 5 (ref 5–15)
Anion gap: 6 (ref 5–15)
BUN: 12 mg/dL (ref 6–20)
BUN: 11 mg/dL (ref 6–20)
CO2: 26 mmol/L (ref 22–32)
CO2: 25 mmol/L (ref 22–32)
Calcium: 8.2 mg/dL — ABNORMAL LOW (ref 8.9–10.3)
Calcium: 8.1 mg/dL — ABNORMAL LOW (ref 8.9–10.3)
Chloride: 107 mmol/L (ref 98–111)
Chloride: 102 mmol/L (ref 98–111)
Creatinine, Ser: 0.94 mg/dL (ref 0.44–1.00)
Creatinine, Ser: 0.9 mg/dL (ref 0.44–1.00)
GFR, Estimated: >60 mL/min (ref >60)
GFR, Estimated: >60 mL/min (ref >60)
Glucose, Bld: 136 mg/dL — ABNORMAL HIGH (ref 70–99)
Glucose, Bld: 191 mg/dL — ABNORMAL HIGH (ref 70–99)
Potassium: 3.9 mmol/L (ref 3.5–5.1)
Potassium: 3.8 mmol/L (ref 3.5–5.1)
Sodium: 138 mmol/L (ref 135–145)
Sodium: 133 mmol/L — ABNORMAL LOW (ref 135–145)

## 2023-08-20 LAB — MAGNESIUM
Magnesium: 2.7 mg/dL — ABNORMAL HIGH (ref 1.7–2.4)
Magnesium: 2.3 mg/dL (ref 1.7–2.4)

## 2023-08-20 LAB — CBC
HCT: 35.1 % — ABNORMAL LOW (ref 36.0–46.0)
HCT: 35.2 % — ABNORMAL LOW (ref 36.0–46.0)
HCT: 33.9 % — ABNORMAL LOW (ref 36.0–46.0)
Hemoglobin: 11.5 g/dL — ABNORMAL LOW (ref 12.0–15.0)
Hemoglobin: 11.9 g/dL — ABNORMAL LOW (ref 12.0–15.0)
Hemoglobin: 11 g/dL — ABNORMAL LOW (ref 12.0–15.0)
MCH: 30.1 pg (ref 26.0–34.0)
MCH: 31 pg (ref 26.0–34.0)
MCH: 30.5 pg (ref 26.0–34.0)
MCHC: 32.8 g/dL (ref 30.0–36.0)
MCHC: 33.8 g/dL (ref 30.0–36.0)
MCHC: 32.4 g/dL (ref 30.0–36.0)
MCV: 91.9 fL (ref 80.0–100.0)
MCV: 91.7 fL (ref 80.0–100.0)
MCV: 93.9 fL (ref 80.0–100.0)
Platelets: 165 10*3/uL (ref 150–400)
Platelets: 151 10*3/uL (ref 150–400)
Platelets: 143 10*3/uL — ABNORMAL LOW (ref 150–400)
RBC: 3.82 MIL/uL — ABNORMAL LOW (ref 3.87–5.11)
RBC: 3.84 MIL/uL — ABNORMAL LOW (ref 3.87–5.11)
RBC: 3.61 MIL/uL — ABNORMAL LOW (ref 3.87–5.11)
RDW: 13.1 % (ref 11.5–15.5)
RDW: 13.3 % (ref 11.5–15.5)
RDW: 13.6 % (ref 11.5–15.5)
WBC: 14.5 10*3/uL — ABNORMAL HIGH (ref 4.0–10.5)
WBC: 14.4 10*3/uL — ABNORMAL HIGH (ref 4.0–10.5)
WBC: 14.9 10*3/uL — ABNORMAL HIGH (ref 4.0–10.5)
nRBC: 0 % (ref 0.0–0.2)
nRBC: 0 % (ref 0.0–0.2)
nRBC: 0 % (ref 0.0–0.2)

## 2023-08-20 LAB — GLUCOSE, CAPILLARY
Glucose-Capillary: 119 mg/dL — ABNORMAL HIGH (ref 70–99)
Glucose-Capillary: 124 mg/dL — ABNORMAL HIGH (ref 70–99)
Glucose-Capillary: 132 mg/dL — ABNORMAL HIGH (ref 70–99)
Glucose-Capillary: 137 mg/dL — ABNORMAL HIGH (ref 70–99)
Glucose-Capillary: 138 mg/dL — ABNORMAL HIGH (ref 70–99)
Glucose-Capillary: 144 mg/dL — ABNORMAL HIGH (ref 70–99)
Glucose-Capillary: 157 mg/dL — ABNORMAL HIGH (ref 70–99)
Glucose-Capillary: 164 mg/dL — ABNORMAL HIGH (ref 70–99)
Glucose-Capillary: 218 mg/dL — ABNORMAL HIGH (ref 70–99)

## 2023-08-20 LAB — POCT I-STAT 7, (LYTES, BLD GAS, ICA,H+H)
Acid-base deficit: 1 mmol/L (ref 0.0–2.0)
Bicarbonate: 24.8 mmol/L (ref 20.0–28.0)
Calcium, Ion: 1.17 mmol/L (ref 1.15–1.40)
HCT: 36 % (ref 36.0–46.0)
Hemoglobin: 12.2 g/dL (ref 12.0–15.0)
O2 Saturation: 94 %
Patient temperature: 37.8
Potassium: 4.5 mmol/L (ref 3.5–5.1)
Sodium: 141 mmol/L (ref 135–145)
TCO2: 26 mmol/L (ref 22–32)
pCO2 arterial: 47.1 mm[Hg] (ref 32–48)
pH, Arterial: 7.333 — ABNORMAL LOW (ref 7.35–7.45)
pO2, Arterial: 80 mm[Hg] — ABNORMAL LOW (ref 83–108)

## 2023-08-20 LAB — CREATININE, SERUM
Creatinine, Ser: 0.85 mg/dL (ref 0.44–1.00)
GFR, Estimated: >60 mL/min (ref >60)

## 2023-08-20 MED ORDER — ALBUMIN HUMAN 5 % IV SOLN: 12.5000 g | Freq: Once | INTRAVENOUS | Status: DC

## 2023-08-20 MED ORDER — INSULIN ASPART 100 UNIT/ML IJ SOLN: 0.0000 [IU] | INTRAMUSCULAR | Status: DC

## 2023-08-20 MED ORDER — FUROSEMIDE 10 MG/ML IJ SOLN
40.0000 mg | Freq: Once | INTRAMUSCULAR | Status: AC
  Administered 2023-08-20: 40 mg via INTRAVENOUS
  Filled 2023-08-20: qty 4

## 2023-08-20 MED ORDER — ENOXAPARIN SODIUM 60 MG/0.6ML IJ SOSY
60.0000 mg | PREFILLED_SYRINGE | Freq: Every day | INTRAMUSCULAR | Status: DC
  Administered 2023-08-20 – 2023-08-23 (×4): 60 mg via SUBCUTANEOUS
  Filled 2023-08-20 (×5): qty 0.6

## 2023-08-20 MED ORDER — INSULIN ASPART 100 UNIT/ML IJ SOLN
0.0000 [IU] | Freq: Three times a day (TID) | INTRAMUSCULAR | Status: DC
  Administered 2023-08-20: 2 [IU] via SUBCUTANEOUS
  Administered 2023-08-20: 4 [IU] via SUBCUTANEOUS
  Administered 2023-08-21: 8 [IU] via SUBCUTANEOUS
  Administered 2023-08-21 – 2023-08-22 (×3): 4 [IU] via SUBCUTANEOUS
  Administered 2023-08-22: 2 [IU] via SUBCUTANEOUS
  Administered 2023-08-22: 4 [IU] via SUBCUTANEOUS
  Administered 2023-08-23 – 2023-08-24 (×6): 2 [IU] via SUBCUTANEOUS

## 2023-08-20 MED ORDER — ENOXAPARIN SODIUM 40 MG/0.4ML IJ SOSY: 40.0000 mg | PREFILLED_SYRINGE | Freq: Every day | INTRAMUSCULAR | Status: DC

## 2023-08-20 NOTE — Plan of Care (Signed)
  Problem: Clinical Measurements: Goal: Ability to maintain clinical measurements within normal limits will improve Outcome: Progressing Goal: Will remain free from infection Outcome: Progressing Goal: Diagnostic test results will improve Outcome: Progressing Goal: Respiratory complications will improve Outcome: Progressing Goal: Cardiovascular complication will be avoided Outcome: Progressing   Problem: Activity: Goal: Risk for activity intolerance will decrease Outcome: Progressing   Problem: Coping: Goal: Level of anxiety will decrease Outcome: Progressing   Problem: Elimination: Goal: Will not experience complications related to bowel motility Outcome: Progressing   Problem: Safety: Goal: Ability to remain free from injury will improve Outcome: Progressing   Problem: Skin Integrity: Goal: Risk for impaired skin integrity will decrease Outcome: Progressing   Problem: Education: Goal: Ability to describe self-care measures that may prevent or decrease complications (Diabetes Survival Skills Education) will improve Outcome: Progressing Goal: Individualized Educational Video(s) Outcome: Progressing   Problem: Coping: Goal: Ability to adjust to condition or change in health will improve Outcome: Progressing   Problem: Fluid Volume: Goal: Ability to maintain a balanced intake and output will improve Outcome: Progressing   Problem: Health Behavior/Discharge Planning: Goal: Ability to identify and utilize available resources and services will improve Outcome: Progressing Goal: Ability to manage health-related needs will improve Outcome: Progressing   Problem: Metabolic: Goal: Ability to maintain appropriate glucose levels will improve Outcome: Progressing   Problem: Nutritional: Goal: Maintenance of adequate nutrition will improve Outcome: Progressing Goal: Progress toward achieving an optimal weight will improve Outcome: Progressing   Problem: Skin  Integrity: Goal: Risk for impaired skin integrity will decrease Outcome: Progressing   Problem: Tissue Perfusion: Goal: Adequacy of tissue perfusion will improve Outcome: Progressing   Problem: Education: Goal: Will demonstrate proper wound care and an understanding of methods to prevent future damage Outcome: Progressing Goal: Knowledge of disease or condition will improve Outcome: Progressing Goal: Knowledge of the prescribed therapeutic regimen will improve Outcome: Progressing Goal: Individualized Educational Video(s) Outcome: Progressing   Problem: Activity: Goal: Risk for activity intolerance will decrease Outcome: Progressing   Problem: Cardiac: Goal: Will achieve and/or maintain hemodynamic stability Outcome: Progressing   Problem: Clinical Measurements: Goal: Postoperative complications will be avoided or minimized Outcome: Progressing   Problem: Respiratory: Goal: Respiratory status will improve Outcome: Progressing   Problem: Skin Integrity: Goal: Wound healing without signs and symptoms of infection Outcome: Progressing Goal: Risk for impaired skin integrity will decrease Outcome: Progressing   Problem: Urinary Elimination: Goal: Ability to achieve and maintain adequate renal perfusion and functioning will improve Outcome: Progressing

## 2023-08-20 NOTE — Progress Notes (Signed)
      [address]       [place] [address]             [phone]                 1 Day Post-Op Procedure(s) (LRB): CORONARY ARTERY BYPASS GRAFTING (CABG) X THREE, USING LEFT INTERNAL MAMMARY ARTERY AND RIGHT GREATER SAPHENEOUS VEIN HARVESTED ENDOSCOPICLY (N/A) TRANSESOPHAGEAL ECHOCARDIOGRAM (N/A)   Events: No events _______________________________________________________________ Vitals: BP 128/71   Pulse 80   Temp 98.6 F (37 C)   Resp 18   Ht 5\' 5"  (1.651 m)   Wt 115.3 kg   SpO2 90%   BMI 42.30 kg/m  Filed Weights   08/18/23 0311 08/19/23 0644 08/20/23 0458  Weight: 115 kg 112.9 kg 115.3 kg     - Neuro: alert NAD  - Cardiovascular: sinus  Drips: none.   PAP: (18-46)/(4-30) 25/13 CO:  [4 L/min-6.9 L/min] 6.9 L/min CI:  [1.8 L/min/m2-3.19 L/min/m2] 3.19 L/min/m2  - Pulm: EWOB  ABG    Component Value Date/Time   PHART 7.333 (L) 08/19/2023 1827   PCO2ART 47.1 08/19/2023 1827   PO2ART 80 (L) 08/19/2023 1827   HCO3 24.8 08/19/2023 1827   TCO2 26 08/19/2023 1827   ACIDBASEDEF 1.0 08/19/2023 1827   O2SAT 94 08/19/2023 1827    - Abd: ND - Extremity: warm  .Intake/Output      11/08 0701 11/09 0700 11/09 0701 11/10 0700   P.O.  120   I.V. (mL/kg) 2122.8 (18.4) 18.2 (0.2)   Blood 710    IV Piggyback 1994.6 0   Total Intake(mL/kg) 4827.4 (41.9) 138.2 (1.2)   Urine (mL/kg/hr) 1822 (0.7) 85 (0.2)   Emesis/NG output 0    Drains 0    Blood 1485    Chest Tube 356 75   Total Output 3663 160   Net +1164.4 -21.8           _______________________________________________________________ Labs:    Latest Ref Rng & Units 08/20/2023    3:28 AM 08/19/2023    7:30 PM 08/19/2023    6:27 PM  CBC  WBC 4.0 - 10.5 K/uL 14.5  15.9    Hemoglobin 12.0 - 15.0 g/dL 21.3  08.6  57.8   Hematocrit 36.0 - 46.0 % 35.1  36.4  36.0   Platelets 150 - 400 K/uL 165  163        Latest Ref Rng & Units 08/20/2023    3:28 AM 08/19/2023    7:30 PM 08/19/2023     6:27 PM  CMP  Glucose 70 - 99 mg/dL 469  629    BUN 6 - 20 mg/dL 12  13    Creatinine 5.28 - 1.00 mg/dL 4.13  2.44    Sodium 010 - 145 mmol/L 138  138  141   Potassium 3.5 - 5.1 mmol/L 3.9  4.3  4.5   Chloride 98 - 111 mmol/L 107  108    CO2 22 - 32 mmol/L 26  24    Calcium 8.9 - 10.3 mg/dL 8.2  7.8      CXR: PV congestion  _______________________________________________________________  Assessment and Plan: POD 1 s/p CABG  Neuro: pain controlled CV: will remove swan and A/line Pulm: IS, ambulation Renal: creat stable, diuresing GI: on diet Heme: stable ID: afebrile Endo: SSI Dispo: continue ICU care   Nicholette Dolson O Jadavion Spoelstra 08/20/2023 10:16 AM

## 2023-08-21 ENCOUNTER — Inpatient Hospital Stay (HOSPITAL_COMMUNITY): Payer: 59

## 2023-08-21 LAB — BASIC METABOLIC PANEL
Anion gap: 9 (ref 5–15)
BUN: 14 mg/dL (ref 6–20)
CO2: 25 mmol/L (ref 22–32)
Calcium: 8.3 mg/dL — ABNORMAL LOW (ref 8.9–10.3)
Chloride: 100 mmol/L (ref 98–111)
Creatinine, Ser: 0.86 mg/dL (ref 0.44–1.00)
GFR, Estimated: >60 mL/min (ref >60)
Glucose, Bld: 197 mg/dL — ABNORMAL HIGH (ref 70–99)
Potassium: 4.2 mmol/L (ref 3.5–5.1)
Sodium: 134 mmol/L — ABNORMAL LOW (ref 135–145)

## 2023-08-21 LAB — GLUCOSE, CAPILLARY
Glucose-Capillary: 231 mg/dL — ABNORMAL HIGH (ref 70–99)
Glucose-Capillary: 184 mg/dL — ABNORMAL HIGH (ref 70–99)
Glucose-Capillary: 184 mg/dL — ABNORMAL HIGH (ref 70–99)
Glucose-Capillary: 156 mg/dL — ABNORMAL HIGH (ref 70–99)

## 2023-08-21 LAB — CBC
HCT: 33.6 % — ABNORMAL LOW (ref 36.0–46.0)
Hemoglobin: 10.8 g/dL — ABNORMAL LOW (ref 12.0–15.0)
MCH: 30.3 pg (ref 26.0–34.0)
MCHC: 32.1 g/dL (ref 30.0–36.0)
MCV: 94.4 fL (ref 80.0–100.0)
Platelets: 141 10*3/uL — ABNORMAL LOW (ref 150–400)
RBC: 3.56 MIL/uL — ABNORMAL LOW (ref 3.87–5.11)
RDW: 13.6 % (ref 11.5–15.5)
WBC: 15.6 10*3/uL — ABNORMAL HIGH (ref 4.0–10.5)
nRBC: 0 % (ref 0.0–0.2)

## 2023-08-21 MED ORDER — ~~LOC~~ CARDIAC SURGERY, PATIENT & FAMILY EDUCATION: Freq: Once | Status: AC

## 2023-08-21 MED ORDER — HYDROMORPHONE HCL 1 MG/ML IJ SOLN
0.5000 mg | Freq: Once | INTRAMUSCULAR | Status: AC
  Administered 2023-08-21: 0.5 mg via INTRAVENOUS
  Filled 2023-08-21: qty 0.5

## 2023-08-21 MED ORDER — FUROSEMIDE 10 MG/ML IJ SOLN
40.0000 mg | Freq: Once | INTRAMUSCULAR | Status: AC
  Administered 2023-08-21: 40 mg via INTRAVENOUS
  Filled 2023-08-21: qty 4

## 2023-08-21 NOTE — Progress Notes (Signed)
      [address]       [place] [address]             [phone]                 2 Days Post-Op Procedure(s) (LRB): CORONARY ARTERY BYPASS GRAFTING (CABG) X THREE, USING LEFT INTERNAL MAMMARY ARTERY AND RIGHT GREATER SAPHENEOUS VEIN HARVESTED ENDOSCOPICLY (N/A) TRANSESOPHAGEAL ECHOCARDIOGRAM (N/A)   Events: No events _______________________________________________________________ Vitals: BP (!) 106/59   Pulse 79   Temp 98.6 F (37 C) (Oral)   Resp (!) 22   Ht 5\' 5"  (1.651 m)   Wt 120.6 kg   SpO2 96%   BMI 44.24 kg/m  Filed Weights   08/19/23 0644 08/20/23 0458 08/21/23 0500  Weight: 112.9 kg 115.3 kg 120.6 kg     - Neuro: alert NAD  - Cardiovascular: sinus  Drips: none.   PAP: (22-37)/(11-19) 23/18  - Pulm: EWOB  ABG    Component Value Date/Time   PHART 7.333 (L) 08/19/2023 1827   PCO2ART 47.1 08/19/2023 1827   PO2ART 80 (L) 08/19/2023 1827   HCO3 24.8 08/19/2023 1827   TCO2 26 08/19/2023 1827   ACIDBASEDEF 1.0 08/19/2023 1827   O2SAT 94 08/19/2023 1827    - Abd: ND - Extremity: warm  .Intake/Output      11/09 0701 11/10 0700 11/10 0701 11/11 0700   P.O. 360    I.V. (mL/kg) 17.5 (0.1)    Blood     IV Piggyback 300.1 0   Total Intake(mL/kg) 677.6 (5.6) 0 (0)   Urine (mL/kg/hr) 1080 (0.4)    Emesis/NG output     Drains 0 0   Blood     Chest Tube 445 100   Total Output 1525 100   Net -847.4 -100           _______________________________________________________________ Labs:    Latest Ref Rng & Units 08/21/2023    2:05 AM 08/20/2023    5:11 PM 08/20/2023   11:06 AM  CBC  WBC 4.0 - 10.5 K/uL 15.6  14.9  14.4   Hemoglobin 12.0 - 15.0 g/dL 91.4  78.2  95.6   Hematocrit 36.0 - 46.0 % 33.6  33.9  35.2   Platelets 150 - 400 K/uL 141  143  151       Latest Ref Rng & Units 08/21/2023    2:05 AM 08/20/2023    5:11 PM 08/20/2023   11:06 AM  CMP  Glucose 70 - 99 mg/dL 213  086    BUN 6 - 20 mg/dL 14  11    Creatinine  5.78 - 1.00 mg/dL 4.69  6.29  5.28   Sodium 135 - 145 mmol/L 134  133    Potassium 3.5 - 5.1 mmol/L 4.2  3.8    Chloride 98 - 111 mmol/L 100  102    CO2 22 - 32 mmol/L 25  25    Calcium 8.9 - 10.3 mg/dL 8.3  8.1      CXR: -  _______________________________________________________________  Assessment and Plan: POD 2 s/p CABG  Neuro: pain controlled CV: will remove pacing wires.  Soft BP, holding BB Pulm: IS, ambulation Renal: creat stable, diuresing GI: on diet Heme: stable ID: afebrile Endo: SSI Dispo:floor   Savita Runner O Senya Hinzman 08/21/2023 9:52 AM

## 2023-08-21 NOTE — Progress Notes (Signed)
Pt arrived from [facility] after CABG x4 with Dr. Dorris Fetch on 11/8.  Telemetry monitor applied and CCMD notified.  CHG bath and skin assessment completed.  Patient oriented to unit and room to include call light and phone.

## 2023-08-22 ENCOUNTER — Inpatient Hospital Stay (HOSPITAL_COMMUNITY): Payer: 59

## 2023-08-22 DIAGNOSIS — Z955 Presence of coronary angioplasty implant and graft: Secondary | ICD-10-CM | POA: Diagnosis not present

## 2023-08-22 DIAGNOSIS — E782 Mixed hyperlipidemia: Secondary | ICD-10-CM | POA: Diagnosis not present

## 2023-08-22 DIAGNOSIS — I10 Essential (primary) hypertension: Secondary | ICD-10-CM | POA: Diagnosis not present

## 2023-08-22 DIAGNOSIS — I25110 Atherosclerotic heart disease of native coronary artery with unstable angina pectoris: Secondary | ICD-10-CM | POA: Diagnosis not present

## 2023-08-22 LAB — CBC
HCT: 29.4 % — ABNORMAL LOW (ref 36.0–46.0)
Hemoglobin: 9.6 g/dL — ABNORMAL LOW (ref 12.0–15.0)
MCH: 29.9 pg (ref 26.0–34.0)
MCHC: 32.7 g/dL (ref 30.0–36.0)
MCV: 91.6 fL (ref 80.0–100.0)
Platelets: 126 10*3/uL — ABNORMAL LOW (ref 150–400)
RBC: 3.21 MIL/uL — ABNORMAL LOW (ref 3.87–5.11)
RDW: 13.4 % (ref 11.5–15.5)
WBC: 10.2 10*3/uL (ref 4.0–10.5)
nRBC: 0 % (ref 0.0–0.2)

## 2023-08-22 LAB — BASIC METABOLIC PANEL
Anion gap: 7 (ref 5–15)
BUN: 14 mg/dL (ref 6–20)
CO2: 26 mmol/L (ref 22–32)
Calcium: 8.4 mg/dL — ABNORMAL LOW (ref 8.9–10.3)
Chloride: 102 mmol/L (ref 98–111)
Creatinine, Ser: 0.84 mg/dL (ref 0.44–1.00)
GFR, Estimated: >60 mL/min (ref >60)
Glucose, Bld: 133 mg/dL — ABNORMAL HIGH (ref 70–99)
Potassium: 3.7 mmol/L (ref 3.5–5.1)
Sodium: 135 mmol/L (ref 135–145)

## 2023-08-22 LAB — TYPE AND SCREEN
ABO/RH(D): A POS
Antibody Screen: NEGATIVE
Unit division: 0
Unit division: 0

## 2023-08-22 LAB — BPAM RBC
Blood Product Expiration Date: 202411272359
Blood Product Expiration Date: 202411272359
ISSUE DATE / TIME: 202411080926
ISSUE DATE / TIME: 202411080926
Unit Type and Rh: 6200
Unit Type and Rh: 6200

## 2023-08-22 LAB — ECHO INTRAOPERATIVE TEE
Height: 65 in
Weight: 3984 [oz_av]

## 2023-08-22 LAB — GLUCOSE, CAPILLARY
Glucose-Capillary: 135 mg/dL — ABNORMAL HIGH (ref 70–99)
Glucose-Capillary: 164 mg/dL — ABNORMAL HIGH (ref 70–99)
Glucose-Capillary: 164 mg/dL — ABNORMAL HIGH (ref 70–99)
Glucose-Capillary: 164 mg/dL — ABNORMAL HIGH (ref 70–99)

## 2023-08-22 MED ORDER — ICOSAPENT ETHYL 1 G PO CAPS
2.0000 g | ORAL_CAPSULE | Freq: Two times a day (BID) | ORAL | Status: DC
  Administered 2023-08-22 – 2023-08-24 (×5): 2 g via ORAL
  Filled 2023-08-22 (×5): qty 2

## 2023-08-22 MED ORDER — CLOPIDOGREL BISULFATE 75 MG PO TABS
300.0000 mg | ORAL_TABLET | Freq: Once | ORAL | Status: AC
  Administered 2023-08-22: 300 mg via ORAL
  Filled 2023-08-22: qty 4

## 2023-08-22 MED ORDER — CLOPIDOGREL BISULFATE 75 MG PO TABS
75.0000 mg | ORAL_TABLET | Freq: Every day | ORAL | Status: DC
  Administered 2023-08-23 – 2023-08-24 (×2): 75 mg via ORAL
  Filled 2023-08-22 (×2): qty 1

## 2023-08-22 MED ORDER — MAGNESIUM CITRATE PO SOLN
1.0000 | Freq: Once | ORAL | Status: AC
  Administered 2023-08-22: 1 via ORAL
  Filled 2023-08-22: qty 296

## 2023-08-22 MED ORDER — ASPIRIN 81 MG PO TBEC
81.0000 mg | DELAYED_RELEASE_TABLET | Freq: Every day | ORAL | Status: DC
  Administered 2023-08-23 – 2023-08-24 (×2): 81 mg via ORAL
  Filled 2023-08-22 (×2): qty 1

## 2023-08-22 NOTE — Progress Notes (Signed)
Inpatient Rehab Admissions Coordinator:  ? ?Per therapy recommendations,  patient was screened for CIR candidacy by Devaney Segers, MS, CCC-SLP. At this time, Pt. Appears to be a a potential candidate for CIR. I will place   order for rehab consult per protocol for full assessment. Please contact me any with questions. ? ?Trine Fread, MS, CCC-SLP ?Rehab Admissions Coordinator  ?[phone] (celll) ?[phone] (office) ? ?

## 2023-08-22 NOTE — Progress Notes (Signed)
Plan of care is reviewed. Pt has been progressing. She is alert and fully oriented x 4, afebrile, NSR on the monitor.   Soft BP in 97/48 -98/57 mmHg, HR 70s-80s. Metoprolol is on hold tonight. She is on 6 LPMof O2 NCL and CPAP at night. Her spo2 on room air is 81-83% with SOB with exertion.  Pt does not have productive coughing. Lungs sounds are clear, but diminished at lung base bilaterally on auscultations.. Breathing exercise effectively with incentive spirometer is encouraged.   Sternal wound with wound vac is negative for drainage. Dressing is intact sealed. Pain is tolerated well with Oxycodone, Tramadol and Tylenol. We will continue to monitor.  Filiberto Pinks, RN

## 2023-08-22 NOTE — Progress Notes (Addendum)
CARDIAC REHAB PHASE I   PRE:  Rate/Rhythm: 82 SR    BP: sitting 108/50    SpO2: 84 RA in bed, 91 4L  MODE:  Ambulation: 130 ft   POST:  Rate/Rhythm: 101 ST    BP: sitting 122/60     SpO2: 90 3L  On arrival pt asleep in bed. Desat quickly in between cpap and O2 exchange. Pt needed max assist getting from supine 30 deg to sitting due to severe sternal pain, became tearful, stating 6/10 pain (oxy 10 mg 1 hour ago). Able to stand with only min assist. Ambulated with RW, standby assist, and 3L. Needed reminders to relax her shoulders. Slow pace, c/o weak legs. VSS, to recliner. OT to work with her. Gave reminders for IS and walking. Pt sts she is concerned for d/c home as her husband is "too big to help her" with getting up. She would love to be considered for CIR.  [id number]  Renee Ho BS, ACSM-CEP 08/22/2023 10:46 AM

## 2023-08-22 NOTE — Progress Notes (Signed)
3 Days Post-Op Procedure(s) (LRB): CORONARY ARTERY BYPASS GRAFTING (CABG) X THREE, USING LEFT INTERNAL MAMMARY ARTERY AND RIGHT GREATER SAPHENEOUS VEIN HARVESTED ENDOSCOPICLY (N/A) TRANSESOPHAGEAL ECHOCARDIOGRAM (N/A) Subjective: C/o incisional pain, has ambulated  Objective: Vital signs in last 24 hours: Temp:  [97.2 F (36.2 C)-99.4 F (37.4 C)] 98.7 F (37.1 C) (11/11 0912) Pulse Rate:  [74-93] 81 (11/11 0912) Cardiac Rhythm: Normal sinus rhythm (11/11 0845) Resp:  [9-22] 20 (11/11 0525) BP: (93-148)/(48-95) 94/80 (11/11 0912) SpO2:  [81 %-99 %] 94 % (11/11 0912) Weight:  [119.1 kg] 119.1 kg (11/11 0500)  Hemodynamic parameters for last 24 hours:    Intake/Output from previous day: 11/10 0701 - 11/11 0700 In: 250 [P.O.:250] Out: 500 [Urine:400; Chest Tube:100] Intake/Output this shift: No intake/output data recorded.  General appearance: alert, cooperative, and no distress Neurologic: intact Heart: regular rate and rhythm Lungs: diminished breath sounds bibasilar Wound: Preveena in place  Lab Results: Recent Labs    08/21/23 0205 08/22/23 0320  WBC 15.6* 10.2  HGB 10.8* 9.6*  HCT 33.6* 29.4*  PLT 141* 126*   BMET:  Recent Labs    08/21/23 0205 08/22/23 0320  NA 134* 135  K 4.2 3.7  CL 100 102  CO2 25 26  GLUCOSE 197* 133*  BUN 14 14  CREATININE 0.86 0.84  CALCIUM 8.3* 8.4*    PT/INR:  Recent Labs    08/19/23 1339  LABPROT 16.5*  INR 1.3*   ABG    Component Value Date/Time   PHART 7.333 (L) 08/19/2023 1827   HCO3 24.8 08/19/2023 1827   TCO2 26 08/19/2023 1827   ACIDBASEDEF 1.0 08/19/2023 1827   O2SAT 94 08/19/2023 1827   CBG (last 3)  Recent Labs    08/21/23 1650 08/21/23 2109 08/22/23 0602  GLUCAP 184* 156* 135*    Assessment/Plan: S/P Procedure(s) (LRB): CORONARY ARTERY BYPASS GRAFTING (CABG) X THREE, USING LEFT INTERNAL MAMMARY ARTERY AND RIGHT GREATER SAPHENEOUS VEIN HARVESTED ENDOSCOPICLY (N/A) TRANSESOPHAGEAL  ECHOCARDIOGRAM (N/A) POD # 3 Progressing slowly CXR shows basilar atelectasis Will diurese when BP allows Continue cardiac rehab CBG reasonably controlled   LOS: 7 days    [name] 08/22/2023

## 2023-08-22 NOTE — Progress Notes (Signed)
   Patient Name: Renee Ho Date of Encounter: 08/22/2023 [facility] Cardiologist: Tessa Lerner, DO   Interval Summary  .    Very sore, lots of pain. Sitting up in the chair, getting up to ambulate.   Vital Signs .    Vitals:   08/22/23 0528 08/22/23 0600 08/22/23 0650 08/22/23 0912  BP: (!) 97/57   94/80  Pulse: 78 87 85 81  Resp:      Temp:    98.7 F (37.1 C)  TempSrc:    Oral  SpO2: 94% 93% 99% 94%  Weight:      Height:        Intake/Output Summary (Last 24 hours) at 08/22/2023 1110 Last data filed at 08/22/2023 0315 Gross per 24 hour  Intake 250 ml  Output 400 ml  Net -150 ml      08/22/2023    5:00 AM 08/21/2023    5:00 AM 08/20/2023    4:58 AM  Last 3 Weights  Weight (lbs) 262 lb 9.1 oz 265 lb 14 oz 254 lb 3.1 oz  Weight (kg) 119.1 kg 120.6 kg 115.3 kg      Telemetry/ECG    Sinus Rhythm - Personally Reviewed  Physical Exam .   GEN: No acute distress.   Neck: No JVD Cardiac: RRR, no murmurs, rubs, or gallops.  Respiratory: Clear to auscultation bilaterally. GI: Soft, nontender, non-distended  MS: No edema  Assessment & Plan .     Renee Ho hx of CAD s/p DES to Lcx (07/23/23), HLD, TIIDM, tobacco abuse, sleep apnea, HTN, anxiety/depression with recent admission for NSTEMI who presented with SOB, exercise intolerance and chest pain.    Unstable Angina Recent NSTEMI CAD s/p recent DES to Lcx (07/2023) S/p CABG -- Recently presented with NSTEMI and underwent PCI to circumflex.  Does have residual disease in the LAD with initial plan to treat medically as it was felt PCI of the proximal/mid LAD would potentially jail moderate to large diagonal branch.  She initially felt well after discharge. Presented back with recurrent angina -- underwent CABG x4 11/8 with Dr. Dorris Fetch (LIMA to LAD, SVG SEQUENTIALLY to DIAGONAL 2 and 1, SVG to PDA) -- working with CR, PT -- Brilinta was held pre op, need to resume DAPT as she is  hemodynamically stable. Will drop ASA to 81mg  daily, reload with plavix 300mg  x1 then 75mg  daily   HTN -- Controlled, soft -- On metoprolol 25 mg twice daily   HLD -- Continue atorvastatin 80 mg daily, Zetia, switch from fenofibrate to Vascepa   Diabetes -- Hemoglobin A1c 6.7 -- PTA meds-> Lantus, metformin, Farxiga and Mounjaro   Per primary Depression Anxiety OSA Tobacco use- reports she has not smoked since discharge     For questions or updates, please contact  [facility] Please consult [email or web address] for contact info under        Signed, Laverda Page, NP

## 2023-08-22 NOTE — Progress Notes (Signed)
   08/22/23 0528  Mobility  HOB Elevated/Bed Position Self regulated  Activity Dangled on edge of bed;Turned to left side;Turned to back - supine;Turned to right side;Stood at bedside;Transferred to/from [facility] and walked in hallway. With 5 LPM of O2 NCL  Range of Motion/Exercises Active;Active Assistive  Level of Assistance Modified independent, requires aide device or extra time  Assistive Device Front wheel walker;BSC  RUE Weight Bearing NWB, with sternal precaution  LUE Weight Bearing NWB,  with sternal precaution  Distance Ambulated (ft) 120 ft  Activity Response Tolerated poorly, SPO2 remained 95% on 5 LPM, but had SOB with exertion. HR 80s-90.  Transport method Ambulatory   Filiberto Pinks, RN

## 2023-08-22 NOTE — Evaluation (Signed)
Physical Therapy Evaluation Patient Details Name: Renee Ho MRN: [id number] DOB: 1976-05-22 Today's Date: 08/22/2023  History of Present Illness  47 y.o. female s/p CABG 11/8. hx of CAD s/p DES to Lcx (07/23/23), HLD, TIIDM, tobacco abuse, sleep apnea, HTN, anxiety/depression with recent admission for NSTEMI who presented with SOB, exercise intolerance and chest pain.  Clinical Impression  Pt is presenting below prior level of function. Pt was ind, working an active position. Currently pt is Mod A for bed mobility, Min A for sit to stand and CGA for short distance gait. Pt was limited by pain in the sternum this session and requires increased verbal cues for sternal precautions in order to protect integrity of the surgical site. Due to pt current functional status, PLOF, home set up and available assistance at home recommending skilled physical therapy services > 3 hours/day on discharge from acute care hospital setting in order to decrease risk for injury, to protect integrity of surgical site and decrease risk of rehospitalization through strengthening and conditioning for improved mobility.  HR and O2 sats remained WNL on 4L O2 via [place].        If plan is discharge home, recommend the following: Assistance with cooking/housework;Assist for transportation;Help with stairs or ramp for entrance     Equipment Recommendations Other (comment) (defer to post acute)  Recommendations for Other Services  Rehab consult    Functional Status Assessment Patient has had a recent decline in their functional status and demonstrates the ability to make significant improvements in function in a reasonable and predictable amount of time.     Precautions / Restrictions Precautions Precautions: Other (comment);Fall Precaution Comments: cardiac/sternal precautions. Reviewed precautions during evaluation with handout provided for reference. Restrictions Weight Bearing Restrictions: Yes RUE Weight  Bearing: Non weight bearing LUE Weight Bearing: Non weight bearing Other Position/Activity Restrictions: sternal precautions      Mobility  Bed Mobility Overal bed mobility: Needs Assistance Bed Mobility: Supine to Sit, Sit to Supine     Supine to sit: Mod assist Sit to supine: Min assist   General bed mobility comments: Mod A at shoulders to get to mid line from side lying. Then pt stood at EOB and was cued multiple times to turn around to sit EOB for sit>supine. Pt then threw her RLE onto the bed and got onto her knee then turned and sat on bed. Pt was educated she needs to bring both legs off bed to get to side lying then roll into supien to protect integrity of surgical site and pt then laid back and moaned in pain. Pt was educated on the purpose of sternal precautions. Spouse present for education    Transfers Overall transfer level: Needs assistance Equipment used: Rolling walker (2 wheels) Transfers: Sit to/from Stand, Bed to chair/wheelchair/BSC Sit to Stand: Min assist   Step pivot transfers: Contact guard assist       General transfer comment: VC for sternal precautions and education on compensatory technique of crossed arms over chest to maintain sternal precautions with and without use of pillow    Ambulation/Gait Ambulation/Gait assistance: Contact guard assist Gait Distance (Feet): 30 Feet Assistive device: Rolling walker (2 wheels) Gait Pattern/deviations: Step-through pattern, Decreased stride length Gait velocity: decreased Gait velocity interpretation: <1.31 ft/sec, indicative of household ambulator   General Gait Details: light use of RW; pt requires verbal cues to stand back from front of RW to prevent impeding forward movement.      Balance Overall balance assessment: Needs assistance  Sitting-balance support: No upper extremity supported, Feet supported Sitting balance-Leahy Scale: Fair Sitting balance - Comments: sitting EOB   Standing balance  support: During functional activity, Reliant on assistive device for balance, Bilateral upper extremity supported Standing balance-Leahy Scale: Poor Standing balance comment: Reliant on RW when walking in hallway         Pertinent Vitals/Pain Pain Assessment Pain Assessment: 0-10 Pain Score: 8  Pain Location: chest through to back Pain Descriptors / Indicators: Pressure, Burning, Stabbing Pain Intervention(s): Monitored during session, RN gave pain meds during session, Limited activity within patient's tolerance    Home Living Family/patient expects to be discharged to:: Private residence Living Arrangements: Spouse/significant other (3 large dogs) Available Help at Discharge: Family;Available 24 hours/day (only able to provide supervision, no physical assist) Type of Home: House Home Access: Stairs to enter Entrance Stairs-Rails: Left Entrance Stairs-Number of Steps: 6   Home Layout: One level Home Equipment: None Additional Comments: Has Cpap machine    Prior Function Prior Level of Function : Independent/Modified Independent;Working/employed;Driving       ADLs Comments: NT on CIR     Extremity/Trunk Assessment   Upper Extremity Assessment Upper Extremity Assessment: Defer to OT evaluation    Lower Extremity Assessment Lower Extremity Assessment: Generalized weakness    Cervical / Trunk Assessment Cervical / Trunk Assessment: Other exceptions Cervical / Trunk Exceptions: body habitus  Communication   Communication Communication: No apparent difficulties  Cognition Arousal: Alert Behavior During Therapy: Impulsive Overall Cognitive Status: Within Functional Limits for tasks assessed         General Comments General comments (skin integrity, edema, etc.): HR and SPO2 remained WNL throughout on 4L O2 via [place] during gait and when assisted to [facility].        Assessment/Plan    PT Assessment Patient needs continued PT services  PT Problem List Decreased  mobility;Decreased strength;Decreased activity tolerance;Decreased balance;Pain       PT Treatment Interventions DME instruction;Therapeutic exercise;Gait training;Balance training;Stair training;Functional mobility training;Therapeutic activities;Patient/family education    PT Goals (Current goals can be found in the Care Plan section)  Acute Rehab PT Goals Patient Stated Goal: to improve strength and mobility to return home PT Goal Formulation: With patient/family Time For Goal Achievement: 09/05/23 Potential to Achieve Goals: Good    Frequency Min 1X/week        AM-PAC PT "6 Clicks" Mobility  Outcome Measure Help needed turning from your back to your side while in a flat bed without using bedrails?: A Lot Help needed moving from lying on your back to sitting on the side of a flat bed without using bedrails?: A Lot Help needed moving to and from a bed to a chair (including a wheelchair)?: A Little Help needed standing up from a chair using your arms (e.g., wheelchair or bedside chair)?: A Little Help needed to walk in hospital room?: A Little Help needed climbing 3-5 steps with a railing? : A Lot 6 Click Score: 15    End of Session Equipment Utilized During Treatment: Other (comment) (heart pillow) Activity Tolerance: Patient limited by pain Patient left: in bed;with call bell/phone within reach;with family/visitor present Nurse Communication: Mobility status PT Visit Diagnosis: Other abnormalities of gait and mobility (R26.89)    Time: 7846-9629 PT Time Calculation (min) (ACUTE ONLY): 31 min   Charges:   PT Evaluation $PT Eval Low Complexity: 1 Low PT Treatments $Therapeutic Activity: 8-22 mins PT General Charges $$ ACUTE PT VISIT: 1 Visit  Harrel Carina, DPT, CLT  Acute Rehabilitation Services Office: [phone] (Secure chat preferred)   Claudia Desanctis 08/22/2023, 1:49 PM

## 2023-08-22 NOTE — Anesthesia Postprocedure Evaluation (Signed)
Anesthesia Post Note  Patient: Renee Ho  Procedure(s) Performed: CORONARY ARTERY BYPASS GRAFTING (CABG) X THREE, USING LEFT INTERNAL MAMMARY ARTERY AND RIGHT GREATER SAPHENEOUS VEIN HARVESTED ENDOSCOPICLY (Chest) TRANSESOPHAGEAL ECHOCARDIOGRAM     Patient location during evaluation: SICU Anesthesia Type: General Level of consciousness: sedated Pain management: pain level controlled Vital Signs Assessment: post-procedure vital signs reviewed and stable Respiratory status: patient remains intubated per anesthesia plan Cardiovascular status: stable Postop Assessment: no apparent nausea or vomiting Anesthetic complications: yes   Encounter Notable Events  Notable Event Outcome Phase Comment  Difficult to intubate - expected  Intraprocedure Filed from anesthesia note documentation.    Last Vitals:  Vitals:   08/22/23 0600 08/22/23 0650  BP:    Pulse: 87 85  Resp:    Temp:    SpO2: 93% 99%    Last Pain:  Vitals:   08/22/23 0602  TempSrc:   PainSc: 7                  Oda Lansdowne S

## 2023-08-22 NOTE — Evaluation (Signed)
Occupational Therapy Evaluation Patient Details Name: Renee Ho MRN: [id number] DOB: [birth year] Today's Date: 08/22/2023   History of Present Illness 47 y.o. female s/p CABG 11/8. hx of CAD s/p DES to Lcx (07/23/23), HLD, TIIDM, tobacco abuse, sleep apnea, HTN, anxiety/depression with recent admission for NSTEMI who presented with SOB, exercise intolerance and chest pain.   Clinical Impression    Patient is s/p CABG surgery resulting in functional limitations due to the deficits listed below (see OT Problem List). Prior to surgery, pt was living at home with spouse independent with all ADL tasks and functional mobility including driving and working as NT in rehab unit.  Patient will benefit from acute skilled OT to increase their safety and independence with ADL and functional mobility for ADL to allow facilitate discharge. Patient will benefit from intensive inpatient follow up therapy, >3 hours/day. OT will continue to follow patient acutely.          If plan is discharge home, recommend the following: A little help with walking and/or transfers;A lot of help with bathing/dressing/bathroom;Assistance with cooking/housework;Assist for transportation;Help with stairs or ramp for entrance    Functional Status Assessment  Patient has had a recent decline in their functional status and demonstrates the ability to make significant improvements in function in a reasonable and predictable amount of time.  Equipment Recommendations  Other (comment);Tub/shower bench;BSC/3in1 (defer to next venue of care)    Recommendations for Other Services Rehab consult;PT consult     Precautions / Restrictions Precautions Precautions: Other (comment);Fall Precaution Comments: cardiac/sternal precautions. Reviewed precautions during evaluation with handout provided for reference. Restrictions Weight Bearing Restrictions: Yes RUE Weight Bearing: Non weight bearing LUE Weight Bearing: Non  weight bearing Other Position/Activity Restrictions: sternal precautions      Mobility Bed Mobility   General bed mobility comments: up in recliner upon therapy arrival. Just finished with cardiac rehab who reported increased difficulty with bed mobility due to pain and sternal precautions.    Transfers Overall transfer level: Needs assistance Equipment used: Rolling walker (2 wheels) Transfers: Sit to/from Stand, Bed to chair/wheelchair/BSC Sit to Stand: Min assist     Step pivot transfers: Contact guard assist     General transfer comment: VC for sternal precautions and education on compensatory technique of crossed arms over chest to maintain sternal precautions.      Balance Overall balance assessment: Needs assistance Sitting-balance support: No upper extremity supported, Feet supported Sitting balance-Leahy Scale: Fair Sitting balance - Comments: sitting in recliner   Standing balance support: During functional activity, Reliant on assistive device for balance, Bilateral upper extremity supported Standing balance-Leahy Scale: Poor Standing balance comment: Reliant on RW when walking in hallway        ADL either performed or assessed with clinical judgement   ADL Overall ADL's : Needs assistance/impaired Eating/Feeding: Modified independent;Sitting   Grooming: Wash/dry face;Oral care;Brushing hair;Set up;Sitting Grooming Details (indicate cue type and reason): limited by sternal precautions and decreased endurance Upper Body Bathing: Minimal assistance;Cueing for compensatory techniques;Sitting;Cueing for safety Upper Body Bathing Details (indicate cue type and reason): limited by sternal precautions Lower Body Bathing: Maximal assistance;Sitting/lateral leans;Sit to/from stand;Cueing for compensatory techniques;Cueing for safety Lower Body Bathing Details (indicate cue type and reason): limited by sternal precautions Upper Body Dressing : Minimal assistance;Cueing  for safety;Cueing for compensatory techniques;Cueing for UE precautions;Sitting Upper Body Dressing Details (indicate cue type and reason): limited by sternal precautions Lower Body Dressing: Total assistance Lower Body Dressing Details (indicate cue type and reason): limited  by sternal precautions and pain Toilet Transfer: Ambulation;Rolling walker (2 wheels);Regular Toilet;Minimal assistance Toilet Transfer Details (indicate cue type and reason): limited by sternal precautions Toileting- Clothing Manipulation and Hygiene: Total assistance;Sit to/from stand;Cueing for safety;Cueing for compensatory techniques Toileting - Clothing Manipulation Details (indicate cue type and reason): limited by sternal precautions     Functional mobility during ADLs: Contact guard assist;Rolling walker (2 wheels);Cueing for safety       Vision Baseline Vision/History: 1 Wears glasses (Pt reports she's supposed to wear glasses for distance but doesn't) Patient Visual Report: No change from baseline Vision Assessment?: No apparent visual deficits (with baseline vision without glasses)     Perception Perception: Not tested       Praxis Praxis: Not tested       Pertinent Vitals/Pain Pain Assessment Pain Assessment: 0-10 Pain Score: 8  Pain Location: chest through to back Pain Descriptors / Indicators: Pressure, Burning, Stabbing (Pressure to stabbing to burning (chest to back)) Pain Intervention(s): Monitored during session, Limited activity within patient's tolerance, Relaxation, Patient requesting pain meds-RN notified     Extremity/Trunk Assessment Upper Extremity Assessment Upper Extremity Assessment: Generalized weakness;Right hand dominant (limited by sternal precautions)   Lower Extremity Assessment Lower Extremity Assessment: Generalized weakness   Cervical / Trunk Assessment Cervical / Trunk Assessment: Other exceptions Cervical / Trunk Exceptions: body habitus   Communication  Communication Communication: No apparent difficulties   Cognition Arousal: Alert Behavior During Therapy: WFL for tasks assessed/performed Overall Cognitive Status: Within Functional Limits for tasks assessed      General Comments  BP seated after walking with cardiac rehab: 122/60, SpO2 98% 4L O2 [place], HR: 87. Decreased endurance demonstrated during session with increased fatigue and pain. Nursing notified of request for pain medication. Pt educated on HEP post op. Verbal education and visual demonstration. Pt verbalized understanding.            Home Living Family/patient expects to be discharged to:: Private residence Living Arrangements: Spouse/significant other (3 large dogs) Available Help at Discharge: Family;Available 24 hours/day (only able to provide supervision, no physical assist) Type of Home: House Home Access: Stairs to enter [employer] of Steps: 6 Entrance Stairs-Rails: Left Home Layout: One level     Bathroom Shower/Tub: Chief Strategy Officer: Standard Bathroom Accessibility: Yes How Accessible: Accessible via walker Home Equipment: None   Additional Comments: Has Cpap machine      Prior Functioning/Environment Prior Level of Function : Independent/Modified Independent;Working/employed;Driving      ADLs Comments: NT on CIR        OT Problem List: Decreased strength;Pain;Cardiopulmonary status limiting activity;Decreased activity tolerance;Impaired balance (sitting and/or standing);Decreased knowledge of use of DME or AE;Impaired UE functional use;Decreased knowledge of precautions      OT Treatment/Interventions: Self-care/ADL training;Therapeutic activities;Therapeutic exercise;Energy conservation;DME and/or AE instruction;Patient/family education;Balance training;Manual therapy;Modalities    OT Goals(Current goals can be found in the care plan section) Acute Rehab OT Goals Patient Stated Goal: to go to rehab OT Goal  Formulation: With patient Time For Goal Achievement: 09/05/23 Potential to Achieve Goals: Good  OT Frequency: Min 1X/week       AM-PAC OT "6 Clicks" Daily Activity     Outcome Measure Help from another person eating meals?: None Help from another person taking care of personal grooming?: A Little Help from another person toileting, which includes using toliet, bedpan, or urinal?: Total Help from another person bathing (including washing, rinsing, drying)?: Total Help from another person to put on and taking off  regular upper body clothing?: A Little Help from another person to put on and taking off regular lower body clothing?: Total 6 Click Score: 13   End of Session Equipment Utilized During Treatment: Rolling walker (2 wheels);Oxygen Nurse Communication: Patient requests pain meds  Activity Tolerance: Patient tolerated treatment well;Patient limited by fatigue;Patient limited by pain Patient left: in chair;with call bell/phone within reach;with family/visitor present  OT Visit Diagnosis: Unsteadiness on feet (R26.81);Muscle weakness (generalized) (M62.81);Pain;Other (comment) (decrease activity tolerance/endurance) Pain - Right/Left:  (N/A) Pain - part of body:  (post op chest pain)                Time: 1040-1110 OT Time Calculation (min): 30 min Charges:  OT General Charges $OT Visit: 1 Visit OT Evaluation $OT Eval Moderate Complexity: 1 Mod OT Treatments $Therapeutic Activity: 8-22 mins  Limmie Patricia, OTR/L,CBIS  Supplemental OT - [facility] and [facility] Secure Chat Preferred    Joanny Dupree, Charisse March 08/22/2023, 11:33 AM

## 2023-08-23 ENCOUNTER — Other Ambulatory Visit: Payer: Self-pay

## 2023-08-23 DIAGNOSIS — R5381 Other malaise: Secondary | ICD-10-CM

## 2023-08-23 DIAGNOSIS — I209 Angina pectoris, unspecified: Secondary | ICD-10-CM | POA: Diagnosis not present

## 2023-08-23 DIAGNOSIS — Z951 Presence of aortocoronary bypass graft: Secondary | ICD-10-CM | POA: Diagnosis not present

## 2023-08-23 LAB — GLUCOSE, CAPILLARY
Glucose-Capillary: 142 mg/dL — ABNORMAL HIGH (ref 70–99)
Glucose-Capillary: 143 mg/dL — ABNORMAL HIGH (ref 70–99)
Glucose-Capillary: 127 mg/dL — ABNORMAL HIGH (ref 70–99)
Glucose-Capillary: 194 mg/dL — ABNORMAL HIGH (ref 70–99)

## 2023-08-23 MED ORDER — PROCHLORPERAZINE EDISYLATE 10 MG/2ML IJ SOLN
5.0000 mg | INTRAMUSCULAR | Status: DC | PRN
Start: 2023-08-23 — End: 2023-08-24
  Administered 2023-08-23 – 2023-08-24 (×2): 5 mg via INTRAVENOUS
  Filled 2023-08-23 (×2): qty 2

## 2023-08-23 MED ORDER — FUROSEMIDE 10 MG/ML IJ SOLN
40.0000 mg | Freq: Once | INTRAMUSCULAR | Status: AC
  Administered 2023-08-23: 40 mg via INTRAVENOUS
  Filled 2023-08-23: qty 4

## 2023-08-23 MED ORDER — POTASSIUM CHLORIDE CRYS ER 20 MEQ PO TBCR
20.0000 meq | EXTENDED_RELEASE_TABLET | Freq: Every day | ORAL | Status: DC
  Administered 2023-08-23 – 2023-08-24 (×2): 20 meq via ORAL
  Filled 2023-08-23 (×2): qty 1

## 2023-08-23 MED ORDER — SMOG ENEMA
960.0000 mL | Freq: Once | RECTAL | Status: DC
  Filled 2023-08-23: qty 960

## 2023-08-23 MED ORDER — NYSTATIN 100000 UNIT/ML MT SUSP
5.0000 mL | Freq: Four times a day (QID) | OROMUCOSAL | Status: DC
  Administered 2023-08-23 – 2023-08-24 (×6): 500000 [IU] via ORAL
  Filled 2023-08-23 (×6): qty 5

## 2023-08-23 MED ORDER — AMIODARONE HCL 200 MG PO TABS
400.0000 mg | ORAL_TABLET | Freq: Two times a day (BID) | ORAL | Status: DC
  Administered 2023-08-23 – 2023-08-24 (×2): 400 mg via ORAL
  Filled 2023-08-23 (×2): qty 2

## 2023-08-23 MED ORDER — OXYCODONE HCL 5 MG PO TABS
5.0000 mg | ORAL_TABLET | ORAL | Status: DC | PRN
  Administered 2023-08-23 – 2023-08-24 (×7): 10 mg via ORAL
  Filled 2023-08-23 (×7): qty 2

## 2023-08-23 NOTE — Progress Notes (Signed)
      [address]       Renee Ho [address]             [phone]      Called to see patient for SVT with heart rate into the 150s and blood pressure systolic in the 80s.  Confirmed with EKG.  Could potentially be in atrial flutter.  She spontaneously converted back to sinus rhythm in the 80s with blood pressure systolic 109.  She appears to be tolerating but is weak and deconditioned overall.  She is on Lopressor 12.5 twice daily.  Discussed with MD.  We are going to ask cardiology to weigh in for recommendations as soft blood pressure has been a issue post operatively.  Rowe Clack, PA-C

## 2023-08-23 NOTE — Progress Notes (Signed)
  Inpatient Rehabilitation Admissions Coordinator   Spoke with patient by phone for rehab assessment. We discussed goals and expectations of a possible CIR admit. She  prefers CIR for rehab. Spouse can provide supervision level only.  Dr Riley Kill to complete consultation and then I will begin Auth with [facility] for possible CIR admit pending approval. Please call me with any questions.   Ottie Glazier, RN, MSN Rehab Admissions Coordinator [phone]

## 2023-08-23 NOTE — Progress Notes (Signed)
    We were asked to see the patient based on an episode of SVT today started roughly 1624 and ended by 1643.  It does appear to be quite regular and at that rate could be flutter but more likely SVT based on the duration. This episode occurred while she was on the commode, but had not tried to have a BM  Since she is not able to tolerate much more than 12.5 mg twice daily would probably recommend using oral amiodarone to try to maintain rhythm and avoid these tachyarrhythmias.  Would would load with oral 400 mg twice daily for 1 week and then decrease to 200 mg twice daily for 1 week before going to maintenance dose.  An outpatient follow-up, would probably consider stopping the amiodarone and then considering a monitor 3 to 4 weeks out after stopping to assess for any recurrent SVT or flutter episodes.  Also discussed vagal maneuvers.  It is possible that this has been triggered by pain and poor p.o. intake.  We will see on formal rounds tomorrow to ensure that she is stable   Bryan Lemma, MD

## 2023-08-23 NOTE — Consult Note (Signed)
Physical Medicine and Rehabilitation Consult Reason for Consult:debility after CABG Referring Physician: Dorris Fetch   HPI: Renee Ho is a 47 y.o. female with hx of NSTEMI 10/24, CAD, HTN, DM, OSA, morbid obesity who developed progressive chest pain and SOB and presented to ED on 11/3. Given recent admission, cath findings, and recurrent symptoms the patient opted for CABG x4 on 08/19/23 by Dr. Dorris Fetch. Post-operatively pt is experiencing expected chest discomfort as well pain in both shoulders/numbness in ~ ulnar half of each hand. Her appetite has been poor, and she's only had one bm since 11/3 which was this morning. Pt has been up with therapies and has had difficulties with transfers, bed mobility requiring min to mod assist. Once up she was able to ambulate contact guard 47' with rollator today. Her husband is very obese and unable to physically assist her at home due to his size and pain issues. Mother can provide supervision but no physical assistance. She lives in a one level home with 6 STE.   Review of Systems  Constitutional:  Positive for malaise/fatigue.  HENT: Negative.    Eyes: Negative.   Respiratory:  Positive for cough and shortness of breath.   Cardiovascular:  Positive for chest pain and leg swelling.  Gastrointestinal:  Positive for constipation and nausea.  Genitourinary: Negative.   Musculoskeletal:  Positive for joint pain.  Skin: Negative.   Neurological:  Positive for sensory change and weakness.  Psychiatric/Behavioral:  The patient is nervous/anxious.    Past Medical History:  Diagnosis Date   Depression    Diabetes mellitus    Diabetes mellitus without complication (HCC)    Diverticula of intestine    High cholesterol    Hypertension    OSA on CPAP    CPAP pressure= 15   Polycystic ovarian syndrome    Sleep apnea    on CPAP with pressure setting= 15   Past Surgical History:  Procedure Laterality Date   ABDOMINAL  HYSTERECTOMY     CORONARY ARTERY BYPASS GRAFT N/A 08/19/2023   Procedure: CORONARY ARTERY BYPASS GRAFTING (CABG) X THREE, USING LEFT INTERNAL MAMMARY ARTERY AND RIGHT GREATER SAPHENEOUS VEIN HARVESTED ENDOSCOPICLY;  Surgeon: [name], MD;  Location: [facility] OR;  Service: Open Heart Surgery;  Laterality: N/A;   CORONARY STENT INTERVENTION N/A 07/23/2023   Procedure: CORONARY STENT INTERVENTION;  Surgeon: Kathleene Hazel, MD;  Location: [facility] INVASIVE CV LAB;  Service: Cardiovascular;  Laterality: N/A;   CYST REMOVAL NECK     KNEE ARTHROSCOPY  07/11/2012   Procedure: ARTHROSCOPY KNEE;  Surgeon: Cammy Copa, MD;  Location: [facility] OR;  Service: Orthopedics;  Laterality: Right;  Right knee arthroscopy with debridement   KNEE SURGERY     LEFT HEART CATH AND CORONARY ANGIOGRAPHY N/A 07/23/2023   Procedure: LEFT HEART CATH AND CORONARY ANGIOGRAPHY;  Surgeon: Kathleene Hazel, MD;  Location: [facility] INVASIVE CV LAB;  Service: Cardiovascular;  Laterality: N/A;   OVARIAN CYST REMOVAL     TEE WITHOUT CARDIOVERSION N/A 08/19/2023   Procedure: TRANSESOPHAGEAL ECHOCARDIOGRAM;  Surgeon: [name], MD;  Location: [facility] OR;  Service: Open Heart Surgery;  Laterality: N/A;   WISDOM TOOTH EXTRACTION  05/11/2017   Family History  Problem Relation Age of Onset   Cancer Mother        breast   Mental illness Mother    Depression Mother    Hyperlipidemia Mother    Breast cancer Mother    Healthy Father  Hyperlipidemia Father    Diabetes Maternal Grandmother    Hyperlipidemia Maternal Grandmother    Cancer Maternal Grandfather        unknown type   Depression Maternal Grandfather    Hyperlipidemia Maternal Grandfather    Stroke Paternal Grandmother    Hyperlipidemia Paternal Grandmother    Hyperlipidemia Paternal Grandfather    Sleep apnea Neg Hx    Social History:  reports that she has been smoking cigarettes. She has a 10 pack-year smoking history. She has never used smokeless  tobacco. She reports that she does not drink alcohol and does not use drugs. Allergies:  Allergies  Allergen Reactions   Morphine And Codeine Other (See Comments)    Migraines   Reglan [Metoclopramide] Other (See Comments)    Psychotic episodes   Chantix [Varenicline] Other (See Comments)    Makes the patient angry feeling   Toradol [Ketorolac Tromethamine] Rash   Medications Prior to Admission  Medication Sig Dispense Refill   aspirin EC 81 MG tablet Take 1 tablet (81 mg total) by mouth daily. Swallow whole. 30 tablet 0   atorvastatin (LIPITOR) 80 MG tablet Take 1 tablet (80 mg total) by mouth daily. 90 tablet 3   dapagliflozin propanediol (FARXIGA) 10 MG TABS tablet Take 1 tablet (10 mg total) by mouth daily. 90 tablet 3   DULoxetine (CYMBALTA) 30 MG capsule Take 3 capsules (90 mg total) by mouth daily. 270 capsule 1   ezetimibe (ZETIA) 10 MG tablet Take 1 tablet (10 mg total) by mouth daily. 90 tablet 3   fenofibrate 160 MG tablet Take 1 tablet (160 mg total) by mouth daily. 90 tablet 3   hydrochlorothiazide (HYDRODIURIL) 25 MG tablet Take 1 tablet (25 mg total) by mouth daily. 90 tablet 2   insulin glargine-yfgn (SEMGLEE, YFGN,) 100 UNIT/ML Pen Inject 60 Units into the skin at bedtime. (Patient taking differently: Inject 100 Units into the skin at bedtime.)     Insulin Pen Needle 31G X 8 MM MISC Use as directed 100 each 3   isosorbide mononitrate (IMDUR) 30 MG 24 hr tablet Take 1 tablet (30 mg total) by mouth daily. 90 tablet 3   lisinopril (ZESTRIL) 5 MG tablet Take 1 tablet (5 mg total) by mouth daily. 90 tablet 3   metFORMIN (GLUCOPHAGE) 1000 MG tablet Take 1 tablet (1,000 mg total) by mouth 2 (two) times daily with a meal. 180 tablet 1   metoprolol tartrate (LOPRESSOR) 25 MG tablet Take 1 tablet (25 mg total) by mouth 2 (two) times daily. 180 tablet 3   ticagrelor (BRILINTA) 90 MG TABS tablet Take 1 tablet (90 mg total) by mouth 2 (two) times daily. (Patient taking differently:  Take 90 mg by mouth 2 (two) times daily. Take on Sundays) 180 tablet 3   tirzepatide (MOUNJARO) 7.5 MG/0.5ML Pen Inject 7.5 mg into the skin once a week. 6 mL 1   PRESCRIPTION MEDICATION See admin instructions. CPAP- CONTINUOUS      Home: Home Living Family/patient expects to be discharged to:: Private residence Living Arrangements: Spouse/significant other Available Help at Discharge: Family, Available 24 hours/day Type of Home: House Home Access: Stairs to enter [employer] of Steps: 6 Entrance Stairs-Rails: Left Home Layout: One level Bathroom Shower/Tub: Engineer, manufacturing systems: Standard Bathroom Accessibility: Yes Home Equipment: None Additional Comments: Has Cpap machine  Lives With: Spouse  Functional History: Prior Function Prior Level of Function : Independent/Modified Independent, Working/employed, Driving ADLs Comments: NT on CIR Functional Status:  Mobility: Bed  Mobility Overal bed mobility: Needs Assistance Bed Mobility: Rolling, Sidelying to Sit Rolling: Min assist Supine to sit: Mod assist Sit to supine: Min assist General bed mobility comments: HOB elevated, pt iniated rolling to the L, asked for assist for trunk elevation Transfers Overall transfer level: Needs assistance Equipment used: Rolling walker (2 wheels) Transfers: Sit to/from Stand, Bed to chair/wheelchair/BSC Sit to Stand: Min assist Bed to/from chair/wheelchair/BSC transfer type:: Step pivot Step pivot transfers: Contact guard assist General transfer comment: VC for sternal precautions and education on compensatory technique of crossed arms over chest to maintain sternal precautions with and without use of pillow, minA to steady descent to low toliet surface height Ambulation/Gait Ambulation/Gait assistance: Contact guard assist Gait Distance (Feet): 75 Feet Assistive device: Rollator (4 wheels) Gait Pattern/deviations: Step-through pattern, Decreased stride length General  Gait Details: light use of rollator, pt with 3 standing rest breaks, c/o SOB due to pain with regular/deep breathing at incision site, SpO2 >94% on 4Lo2 via [place] Gait velocity: decreased Gait velocity interpretation: <1.31 ft/sec, indicative of household ambulator    ADL: ADL Overall ADL's : Needs assistance/impaired Eating/Feeding: Modified independent, Sitting Grooming: Wash/dry face, Oral care, Brushing hair, Set up, Sitting Grooming Details (indicate cue type and reason): limited by sternal precautions and decreased endurance Upper Body Bathing: Minimal assistance, Cueing for compensatory techniques, Sitting, Cueing for safety Upper Body Bathing Details (indicate cue type and reason): limited by sternal precautions Lower Body Bathing: Maximal assistance, Sitting/lateral leans, Sit to/from stand, Cueing for compensatory techniques, Cueing for safety Lower Body Bathing Details (indicate cue type and reason): limited by sternal precautions Upper Body Dressing : Minimal assistance, Cueing for safety, Cueing for compensatory techniques, Cueing for UE precautions, Sitting Upper Body Dressing Details (indicate cue type and reason): limited by sternal precautions Lower Body Dressing: Total assistance Lower Body Dressing Details (indicate cue type and reason): limited by sternal precautions and pain Toilet Transfer: Ambulation, Rolling walker (2 wheels), Regular Toilet, Minimal assistance Toilet Transfer Details (indicate cue type and reason): limited by sternal precautions Toileting- Clothing Manipulation and Hygiene: Total assistance, Sit to/from stand, Cueing for safety, Cueing for compensatory techniques Toileting - Clothing Manipulation Details (indicate cue type and reason): limited by sternal precautions Functional mobility during ADLs: Contact guard assist, Rolling walker (2 wheels), Cueing for safety  Cognition: Cognition Overall Cognitive Status: Within Functional Limits for tasks  assessed Orientation Level: Oriented X4 Cognition Arousal: Alert Behavior During Therapy: Lability Overall Cognitive Status: Within Functional Limits for tasks assessed General Comments: pt able to recall precautions but requires verbal cues to adhere functionally, pt became emotional during ambulation due to life altering event  Blood pressure (!) 102/49, pulse 79, temperature 98.3 F (36.8 C), temperature source Axillary, resp. rate 20, height 5\' 5"  (1.651 m), weight 119.7 kg, SpO2 97%. Physical Exam Constitutional:      General: She is not in acute distress.    Appearance: She is obese.  HENT:     Right Ear: External ear normal.     Left Ear: External ear normal.  Eyes:     Pupils: Pupils are equal, round, and reactive to light.  Cardiovascular:     Rate and Rhythm: Normal rate and regular rhythm.  Pulmonary:     Effort: Pulmonary effort is normal.  Abdominal:     General: There is distension.     Palpations: Abdomen is soft.  Musculoskeletal:        General: Swelling and tenderness (RLE and chest wall) present.  Cervical back: Normal range of motion and neck supple.     Comments: Sternal and RLE discomfort. Pain with bilateral shoulder ER/ER and ABD  Skin:    Comments: Sternal wound with vac in place, no drainage in cannister. Right leg donor sites dressed, dry.   Neurological:     Mental Status: She is alert.     Comments: Alert and oriented x 3. Normal insight and awareness. Intact Memory. Normal language and speech. Cranial nerve exam unremarkable. MMT: 4/5 prox UE's to 4- distally with grip. RLE limited by pain. LLE 4/5. Decreased LT in ~ulnar half of both hands (digits 2-5).  No abnormal tone  Psychiatric:        Mood and Affect: Mood normal.        Behavior: Behavior normal.     Results for orders placed or performed during the hospital encounter of 08/14/23 (from the past 24 hour(s))  Glucose, capillary     Status: Abnormal   Collection Time: 08/22/23 12:30 PM   Result Value Ref Range   Glucose-Capillary 164 (H) 70 - 99 mg/dL  Glucose, capillary     Status: Abnormal   Collection Time: 08/22/23  4:17 PM  Result Value Ref Range   Glucose-Capillary 164 (H) 70 - 99 mg/dL  Glucose, capillary     Status: Abnormal   Collection Time: 08/22/23  9:17 PM  Result Value Ref Range   Glucose-Capillary 164 (H) 70 - 99 mg/dL   Comment 1 Notify RN    Comment 2 Document in Chart   Glucose, capillary     Status: Abnormal   Collection Time: 08/23/23  5:32 AM  Result Value Ref Range   Glucose-Capillary 142 (H) 70 - 99 mg/dL   Comment 1 Notify RN    Comment 2 Document in Chart    DG Chest 2 View  Result Date: 08/22/2023 CLINICAL DATA:  Follow-up atelectasis EXAM: CHEST - 2 VIEW COMPARISON:  08/21/2023, 08/20/2023, 08/15/2023 FINDINGS: Post sternotomy changes. Small bilateral pleural effusions. Enlarged cardiomediastinal silhouette with mild central congestion. Subsegmental atelectasis in the left lung base. Removal of lower chest drainage catheters. No convincing pneumothorax is seen. Removal of right IJ catheter sheath. IMPRESSION: 1. Subsegmental atelectasis in the left lower lung. Removal of right IJ catheter sheath and chest drainage tubes. 2. Enlarged cardiomediastinal silhouette with mild central congestion and small pleural effusions. Electronically Signed   By: Jasmine Pang M.D.   On: 08/22/2023 15:11    Assessment/Plan: Diagnosis: 47 yo morbidly obese female with debility after CABG.  Mobility inhibited by bilateral shoulder pain (?RTC strain) and numbness in ulnar hand, both of which are new. Does the need for close, 24 hr/day medical supervision in concert with the patient's rehab needs make it unreasonable for this patient to be served in a less intensive setting? Yes Co-Morbidities requiring supervision/potential complications:  -morbid obesity -pain control -wound care -diabetes Due to bladder management, bowel management, safety, skin/wound care,  disease management, medication administration, pain management, and patient education, does the patient require 24 hr/day rehab nursing? Yes Does the patient require coordinated care of a physician, rehab nurse, therapy disciplines of PT, OT to address physical and functional deficits in the context of the above medical diagnosis(es)? Yes Addressing deficits in the following areas: balance, endurance, locomotion, strength, transferring, bowel/bladder control, bathing, dressing, feeding, grooming, toileting, and psychosocial support Can the patient actively participate in an intensive therapy program of at least 3 hrs of therapy per day at least 5 days per  week? Yes The potential for patient to make measurable gains while on inpatient rehab is excellent Anticipated functional outcomes upon discharge from inpatient rehab are modified independent  with PT, modified independent with OT, n/a with SLP. Estimated rehab length of stay to reach the above functional goals is: 7 days Anticipated discharge destination: Home Overall Rehab/Functional Prognosis: excellent  POST ACUTE RECOMMENDATIONS: This patient's condition is appropriate for continued rehabilitative care in the following setting: CIR Patient has agreed to participate in recommended program. Yes Note that insurance prior authorization may be required for reimbursement for recommended care.  Comment: Pt is extremely motivated to regain functional independence. She was very active prior to her recent cardiac issues. Neither husband or mom can provide physical assist at home. Rehab Admissions Coordinator to follow up.         I have personally performed a face to face diagnostic evaluation of this patient. Additionally, I have examined the patient's medical record including any pertinent labs and radiographic images. If the physician assistant has documented in this note, I have reviewed and edited or otherwise concur with the physician  assistant's documentation.  Thanks,  [name], MD 08/23/2023

## 2023-08-23 NOTE — Progress Notes (Signed)
Physical Therapy Treatment Patient Details Name: Renee Ho MRN: [id number] DOB: 04/14/1976 Today's Date: 08/23/2023   History of Present Illness 47 y.o. female s/p CABG 11/8. hx of CAD s/p DES to Lcx (07/23/23), HLD, TIIDM, tobacco abuse, sleep apnea, HTN, anxiety/depression with recent admission for NSTEMI who presented with SOB, exercise intolerance and chest pain.    PT Comments  Pt c/o constipation and not having a BM in over a week. Despite 8/10 chest pain pt eager to mobilize. Pt activity tolerance limited by chest incisional pain and decreased ability to take normal breath due to onset of pain at chest incision requiring frequent rest breaks during ambulation. Pt assist to commode upon return and pt with positive start to BM. Pt given call light and RN notified pt on commode. Continue to recommend inpatient rehab program > 3 hrs a day to maximize indep function as pt was indep and working full time PTA. Acute PT and mobility team to continue to follow.    If plan is discharge home, recommend the following: Assistance with cooking/housework;Assist for transportation;Help with stairs or ramp for entrance   Can travel by private vehicle        Equipment Recommendations  Other (comment) (defer to post acute)    Recommendations for Other Services Rehab consult     Precautions / Restrictions Precautions Precautions: Other (comment);Fall;Sternal Precaution Comments: cardiac/sternal precautions. Reviewed precautions during evaluation with handout provided for reference. wound vac to sternum Restrictions Weight Bearing Restrictions: Yes (sternal prec) RUE Weight Bearing: Non weight bearing LUE Weight Bearing: Non weight bearing Other Position/Activity Restrictions: sternal precautions     Mobility  Bed Mobility Overal bed mobility: Needs Assistance Bed Mobility: Rolling, Sidelying to Sit Rolling: Min assist         General bed mobility comments: HOB elevated,  pt iniated rolling to the L, asked for assist for trunk elevation    Transfers Overall transfer level: Needs assistance Equipment used: Rolling walker (2 wheels) Transfers: Sit to/from Stand, Bed to chair/wheelchair/BSC Sit to Stand: Min assist           General transfer comment: VC for sternal precautions and education on compensatory technique of crossed arms over chest to maintain sternal precautions with and without use of pillow, minA to steady descent to low toliet surface height    Ambulation/Gait Ambulation/Gait assistance: Contact guard assist Gait Distance (Feet): 75 Feet Assistive device: Rollator (4 wheels) Gait Pattern/deviations: Step-through pattern, Decreased stride length Gait velocity: decreased Gait velocity interpretation: <1.31 ft/sec, indicative of household ambulator   General Gait Details: light use of rollator, pt with 3 standing rest breaks, c/o SOB due to pain with regular/deep breathing at incision site, SpO2 >94% on 4Lo2 via [place]   Stairs             Wheelchair Mobility     Tilt Bed    Modified Rankin (Stroke Patients Only)       Balance Overall balance assessment: Needs assistance Sitting-balance support: No upper extremity supported, Feet supported Sitting balance-Leahy Scale: Fair Sitting balance - Comments: sitting EOB   Standing balance support: During functional activity, Reliant on assistive device for balance, Bilateral upper extremity supported Standing balance-Leahy Scale: Poor Standing balance comment: Reliant on RW when walking in hallway                            Cognition Arousal: Alert Behavior During Therapy: Lability Overall Cognitive Status: Within Functional Limits  for tasks assessed                                 General Comments: pt able to recall precautions but requires verbal cues to adhere functionally, pt became emotional during ambulation due to life altering event         Exercises      General Comments General comments (skin integrity, edema, etc.): VSS      Pertinent Vitals/Pain Pain Assessment Pain Assessment: 0-10 Pain Score: 8  Pain Location: chest through to back Pain Descriptors / Indicators: Pressure, Burning, Stabbing Pain Intervention(s): RN gave pain meds during session, Patient requesting pain meds-RN notified (gave oxy prior to therapy session and tramadol during PT session)    Home Living                          Prior Function            PT Goals (current goals can now be found in the care plan section) Acute Rehab PT Goals Patient Stated Goal: have a BM PT Goal Formulation: With patient/family Time For Goal Achievement: 09/05/23 Potential to Achieve Goals: Good Progress towards PT goals: Progressing toward goals    Frequency    Min 1X/week      PT Plan      Co-evaluation              AM-PAC PT "6 Clicks" Mobility   Outcome Measure  Help needed turning from your back to your side while in a flat bed without using bedrails?: A Lot Help needed moving from lying on your back to sitting on the side of a flat bed without using bedrails?: A Lot Help needed moving to and from a bed to a chair (including a wheelchair)?: A Little Help needed standing up from a chair using your arms (e.g., wheelchair or bedside chair)?: A Little Help needed to walk in hospital room?: A Little Help needed climbing 3-5 steps with a railing? : A Lot 6 Click Score: 15    End of Session Equipment Utilized During Treatment: Other (comment);Oxygen (heart pillow, 4LO2 via [place]) Activity Tolerance: Patient tolerated treatment well Patient left:  (on commode with pull cord in hand) Nurse Communication: Mobility status PT Visit Diagnosis: Other abnormalities of gait and mobility (R26.89)     Time: 8295-6213 PT Time Calculation (min) (ACUTE ONLY): 34 min  Charges:    $Gait Training: 8-22 mins $Therapeutic Activity: 8-22  mins PT General Charges $$ ACUTE PT VISIT: 1 Visit                     Lewis Shock, PT, DPT Acute Rehabilitation Services Secure chat preferred Office #: [phone]    Iona Hansen 08/23/2023, 9:44 AM

## 2023-08-23 NOTE — Progress Notes (Signed)
Today is day 11 that patient has not had a B.M. She does have bowel sounds and is passing gas. Abdo. Is distended.Had a bottle of Mag. Citrate yesterday . No results yet. [name]

## 2023-08-23 NOTE — PMR Pre-admission (Signed)
PMR Admission Coordinator Pre-Admission Assessment  Patient: Renee Ho is an 47 y.o., female MRN: [id number] DOB: Dec 09, 1975 Height: 5\' 5"  (165.1 cm) Weight: 119.7 kg              Insurance Information HMO: ***    PPO: ***     PCP: ***     IPA: ***     80/20: ***     OTHER: *** PRIMARY: [employer]      Policy#: [id number]      Subscriber: *** CM Name: ***      Phone#: ***     Fax#: *** Pre-Cert#: ***      Employer: *** Benefits:  Phone #: ***     Name: *** Eff. Date: ***     Deduct: ***      Out of Pocket Max: ***      Life Max: ***  CIR: ***      SNF: *** Outpatient: ***     Co-Pay: *** Home Health: ***      Co-Pay: *** DME: ***     Co-Pay: *** Providers: ***  SECONDARY: none  Financial Counselor:       Phone#:   The "Data Collection Information Summary" for patients in Inpatient Rehabilitation Facilities with attached "Privacy Act Statement-Health Care Records" was provided and verbally reviewed with: N/A  Emergency Contact Information Contact Information     Name Relation Home Work Mobile   [place] Daughter   [phone]   Wright,Clarence Spouse   [phone]      Other Contacts   None on File    Current Medical History  Patient Admitting Diagnosis: CABG, debility  History of Present Illness: 47 year old female with history of CAD s/p NSTEMI s/p PCI with stent to the left circumflex on 10/22/22, type 2 DM, HTN HLD< anxiety, OSA and tobacco abuse who was admitted to [facility] on 08/14/23 with chest pain. Recent cath with a notable lesion within the LAD, that was not amenable to PCI/stent placement., Rather it was recommended for medical management, Placed on baby ASA as well as Brilinta, high intensity atorvastatin, lisinopril  and metoprolol tartrate. Despite compliance, presented with chest pain.   CVTS consulted as well as Cardiology. Placed on Cangrelor IV to bridge to surgery.  Underwent CABG x 3 on 11/8. Post operative pain management,  constipation and nausea. BP low, Remains on O2 at 4 liters [place], IV lasix given for diuresis.   Patient's medical record from [facility] has been reviewed by the rehabilitation admission coordinator and physician.  Past Medical History  Past Medical History:  Diagnosis Date   Depression    Diabetes mellitus    Diabetes mellitus without complication (HCC)    Diverticula of intestine    High cholesterol    Hypertension    OSA on CPAP    CPAP pressure= 15   Polycystic ovarian syndrome    Sleep apnea    on CPAP with pressure setting= 15   Has the patient had major surgery during 100 days prior to admission? Yes  Family History  family history includes Breast cancer in her mother; Cancer in her maternal grandfather and mother; Depression in her maternal grandfather and mother; Diabetes in her maternal grandmother; Healthy in her father; Hyperlipidemia in her father, maternal grandfather, maternal grandmother, mother, paternal grandfather, and paternal grandmother; Mental illness in her mother; Stroke in her paternal grandmother.   Current Medications   Current Facility-Administered Medications:    acetaminophen (  TYLENOL) tablet 1,000 mg, 1,000 mg, Oral, Q6H, 1,000 mg at 08/23/23 0545 **OR** acetaminophen (TYLENOL) 160 MG/5ML solution 1,000 mg, 1,000 mg, Per Tube, Q6H, Joycelyn Man, Donielle M, PA-C   aspirin EC tablet 81 mg, 81 mg, Oral, Daily, Laverda Page B, NP, 81 mg at 08/23/23 0952   atorvastatin (LIPITOR) tablet 80 mg, 80 mg, Oral, Daily, Doree Fudge M, PA-C, 80 mg at 08/23/23 1308   bisacodyl (DULCOLAX) EC tablet 10 mg, 10 mg, Oral, Daily, 10 mg at 08/23/23 0953 **OR** bisacodyl (DULCOLAX) suppository 10 mg, 10 mg, Rectal, Daily, Zimmerman, Donielle M, PA-C   Chlorhexidine Gluconate Cloth 2 % PADS 6 each, 6 each, Topical, Daily, [name], MD, 6 each at 08/21/23 0956   clopidogrel (PLAVIX) tablet 75 mg, 75 mg, Oral, Daily, Laverda Page B, NP, 75 mg  at 08/23/23 0952   dextrose 50 % solution 0-50 mL, 0-50 mL, Intravenous, PRN, Doree Fudge M, PA-C   docusate sodium (COLACE) capsule 200 mg, 200 mg, Oral, Daily, Doree Fudge M, PA-C, 200 mg at 08/23/23 6578   DULoxetine (CYMBALTA) DR capsule 90 mg, 90 mg, Oral, Daily, Doree Fudge M, PA-C, 90 mg at 08/23/23 0952   enoxaparin (LOVENOX) injection 60 mg, 60 mg, Subcutaneous, QHS, Lightfoot, Harrell O, MD, 60 mg at 08/22/23 2107   ezetimibe (ZETIA) tablet 10 mg, 10 mg, Oral, Daily, Doree Fudge M, PA-C, 10 mg at 08/23/23 4696   icosapent Ethyl (VASCEPA) 1 g capsule 2 g, 2 g, Oral, BID, Laverda Page B, NP, 2 g at 08/23/23 0953   CBG monitoring, , , 4x Daily, AC & HS **AND** insulin aspart (novoLOG) injection 0-24 Units, 0-24 Units, Subcutaneous, TID WC, [name], MD, 2 Units at 08/23/23 2952   melatonin tablet 3 mg, 3 mg, Oral, QHS PRN, Doree Fudge M, PA-C, 3 mg at 08/22/23 2106   metoprolol tartrate (LOPRESSOR) tablet 12.5 mg, 12.5 mg, Oral, BID, 12.5 mg at 08/21/23 0952 **OR** metoprolol tartrate (LOPRESSOR) 25 mg/10 mL oral suspension 12.5 mg, 12.5 mg, Per Tube, BID, Zimmerman, Donielle M, PA-C   metoprolol tartrate (LOPRESSOR) injection 2.5-5 mg, 2.5-5 mg, Intravenous, Q2H PRN, Doree Fudge M, PA-C   ondansetron [facility]) injection 4 mg, 4 mg, Intravenous, Q6H PRN, Doree Fudge M, PA-C, 4 mg at 08/23/23 8413   oxyCODONE (Oxy IR/ROXICODONE) immediate release tablet 5-10 mg, 5-10 mg, Oral, Q4H PRN, Barrett, Erin R, PA-C, 10 mg at 08/23/23 0803   pantoprazole (PROTONIX) EC tablet 40 mg, 40 mg, Oral, Daily, Doree Fudge M, PA-C, 40 mg at 08/23/23 2440   potassium chloride SA (KLOR-CON M) CR tablet 20 mEq, 20 mEq, Oral, Daily, Barrett, Erin R, PA-C, 20 mEq at 08/23/23 0952   sodium chloride flush (NS) 0.9 % injection 10 mL, 10 mL, Intravenous, Q12H, [name], MD, 10 mL at 08/22/23 1122   sodium chloride flush (NS) 0.9 %  injection 3 mL, 3 mL, Intravenous, Q12H, Zimmerman, Donielle M, PA-C, 3 mL at 08/23/23 1000   sodium chloride flush (NS) 0.9 % injection 3 mL, 3 mL, Intravenous, PRN, Joycelyn Man, Donielle M, PA-C   sorbitol, magnesium hydroxide, mineral oil, glycerin (SMOG) enema, 960 mL, Rectal, Once, Barrett, Erin R, PA-C   traMADol (ULTRAM) tablet 50-100 mg, 50-100 mg, Oral, Q4H PRN, Doree Fudge M, PA-C, 100 mg at 08/23/23 0919  Patients Current Diet:  Diet Order             Diet Carb Modified Fluid consistency: Thin; Room service appropriate? Yes  Diet  effective now                   Precautions / Restrictions Precautions Precautions: Other (comment), Fall, Sternal Precaution Comments: cardiac/sternal precautions. Reviewed precautions during evaluation with handout provided for reference. wound vac to sternum Restrictions Weight Bearing Restrictions: Yes (sternal prec) RUE Weight Bearing: Non weight bearing LUE Weight Bearing: Non weight bearing Other Position/Activity Restrictions: sternal precautions   Has the patient had 2 or more falls or a fall with injury in the past year?No  Prior Activity Level Community (5-7x/wk): independent, driving, working fulltime  Prior Functional Level Prior Function Prior Level of Function : Independent/Modified Independent, Working/employed, Driving ADLs Comments: NT on CIR  Self Care: Did the patient need help bathing, dressing, using the toilet or eating?  Independent  Indoor Mobility: Did the patient need assistance with walking from room to room (with or without device)? Independent  Stairs: Did the patient need assistance with internal or external stairs (with or without device)? Independent  Functional Cognition: Did the patient need help planning regular tasks such as shopping or remembering to take medications? Independent  Patient Information Are you of Hispanic, Latino/a,or Spanish origin?: A. No, not of Hispanic, Latino/a, or  Spanish origin Do you need or want an interpreter to communicate with a doctor or health care staff?: 0. No  Patient's Response To:  Health Literacy and Transportation Is the patient able to respond to health literacy and transportation needs?: Yes Health Literacy - How often do you need to have someone help you when you read instructions, pamphlets, or other written material from your doctor or pharmacy?: Never In the past 12 months, has lack of transportation kept you from medical appointments or from getting medications?: No In the past 12 months, has lack of transportation kept you from meetings, work, or from getting things needed for daily living?: No  Home Assistive Devices / Equipment Home Equipment: None  Prior Device Use: Indicate devices/aids used by the patient prior to current illness, exacerbation or injury? None of the above  Current Functional Level Cognition  Overall Cognitive Status: Within Functional Limits for tasks assessed Orientation Level: Oriented X4 General Comments: pt able to recall precautions but requires verbal cues to adhere functionally, pt became emotional during ambulation due to life altering event    Extremity Assessment (includes Sensation/Coordination)  Upper Extremity Assessment: Defer to OT evaluation  Lower Extremity Assessment: Generalized weakness    ADLs  Overall ADL's : Needs assistance/impaired Eating/Feeding: Modified independent, Sitting Grooming: Wash/dry face, Oral care, Brushing hair, Set up, Sitting Grooming Details (indicate cue type and reason): limited by sternal precautions and decreased endurance Upper Body Bathing: Minimal assistance, Cueing for compensatory techniques, Sitting, Cueing for safety Upper Body Bathing Details (indicate cue type and reason): limited by sternal precautions Lower Body Bathing: Maximal assistance, Sitting/lateral leans, Sit to/from stand, Cueing for compensatory techniques, Cueing for safety Lower  Body Bathing Details (indicate cue type and reason): limited by sternal precautions Upper Body Dressing : Minimal assistance, Cueing for safety, Cueing for compensatory techniques, Cueing for UE precautions, Sitting Upper Body Dressing Details (indicate cue type and reason): limited by sternal precautions Lower Body Dressing: Total assistance Lower Body Dressing Details (indicate cue type and reason): limited by sternal precautions and pain Toilet Transfer: Ambulation, Rolling walker (2 wheels), Regular Toilet, Minimal assistance Toilet Transfer Details (indicate cue type and reason): limited by sternal precautions Toileting- Clothing Manipulation and Hygiene: Total assistance, Sit to/from stand, Cueing for safety, Cueing for  compensatory techniques Toileting - Clothing Manipulation Details (indicate cue type and reason): limited by sternal precautions Functional mobility during ADLs: Contact guard assist, Rolling walker (2 wheels), Cueing for safety    Mobility  Overal bed mobility: Needs Assistance Bed Mobility: Rolling, Sidelying to Sit Rolling: Min assist Supine to sit: Mod assist Sit to supine: Min assist General bed mobility comments: HOB elevated, pt iniated rolling to the L, asked for assist for trunk elevation    Transfers  Overall transfer level: Needs assistance Equipment used: Rolling walker (2 wheels) Transfers: Sit to/from Stand, Bed to chair/wheelchair/BSC Sit to Stand: Min assist Bed to/from chair/wheelchair/BSC transfer type:: Step pivot Step pivot transfers: Contact guard assist General transfer comment: VC for sternal precautions and education on compensatory technique of crossed arms over chest to maintain sternal precautions with and without use of pillow, minA to steady descent to low toliet surface height    Ambulation / Gait / Stairs / Wheelchair Mobility  Ambulation/Gait Ambulation/Gait assistance: Contact guard assist Gait Distance (Feet): 75 Feet Assistive  device: Rollator (4 wheels) Gait Pattern/deviations: Step-through pattern, Decreased stride length General Gait Details: light use of rollator, pt with 3 standing rest breaks, c/o SOB due to pain with regular/deep breathing at incision site, SpO2 >94% on 4Lo2 via [place] Gait velocity: decreased Gait velocity interpretation: <1.31 ft/sec, indicative of household ambulator    Posture / Balance Dynamic Sitting Balance Sitting balance - Comments: sitting EOB Balance Overall balance assessment: Needs assistance Sitting-balance support: No upper extremity supported, Feet supported Sitting balance-Leahy Scale: Fair Sitting balance - Comments: sitting EOB Standing balance support: During functional activity, Reliant on assistive device for balance, Bilateral upper extremity supported Standing balance-Leahy Scale: Poor Standing balance comment: Reliant on RW when walking in hallway    Special needs/care consideration Sternal precautions Smoker Hgb A1c 6.7     Previous Home Environment  Living Arrangements: Spouse/significant other  Lives With: Spouse Available Help at Discharge: Family, Available 24 hours/day Type of Home: House Home Layout: One level Home Access: Stairs to enter Entrance Stairs-Rails: Left Entrance Stairs-Number of Steps: 6 Bathroom Shower/Tub: Engineer, manufacturing systems: Standard Bathroom Accessibility: Yes How Accessible: Accessible via walker Home Care Services: No Additional Comments: Has Cpap machine  Discharge Living Setting Plans for Discharge Living Setting: Patient's home, Lives with (comment) (spouse) Type of Home at Discharge: House Discharge Home Layout: One level Discharge Home Access: Stairs to enter Entrance Stairs-Rails: Left Entrance Stairs-Number of Steps: 6 Discharge Bathroom Shower/Tub: Tub/shower unit Discharge Bathroom Toilet: Standard Discharge Bathroom Accessibility: Yes How Accessible: Accessible via walker Does the patient have any  problems obtaining your medications?: No  Social/Family/Support Systems Patient Roles: Spouse (employee) Contact Information: spouse, Maryjane Hurter and dtr, Desiree Anticipated Caregiver: spouse and daughter Anticipated Caregiver's Contact Information: see contacts Ability/Limitations of Caregiver: spouse can provide only superivsion, dtr works Engineer, structural Availability: 24/7 Discharge Plan Discussed with Primary Caregiver: Yes Is Caregiver In Agreement with Plan?: Yes Does Caregiver/Family have Issues with Lodging/Transportation while Pt is in Rehab?: No  Goals Patient/Family Goal for Rehab: Mod I to supervision with PT and OT Expected length of stay: ELOS 7 to 10 days Pt/Family Agrees to Admission and willing to participate: Yes Program Orientation Provided & Reviewed with Pt/Caregiver Including Roles  & Responsibilities: Yes  Decrease burden of Care through IP rehab admission: n/a  Possible need for SNF placement upon discharge:not anticipated  Patient Condition: This patient's condition remains as documented in the consult dated 08/23/2023, in which the Rehabilitation Physician determined and  documented that the patient's condition is appropriate for intensive rehabilitative care in an inpatient rehabilitation facility. Will admit to inpatient rehab today.  Preadmission Screen Completed By:  Clois Dupes, RN MSN 08/23/2023 11:02 AM ______________________________________________________________________   Discussed status with Dr. Marland Kitchenon***at *** and received approval for admission today.  Admission Coordinator:  Clois Dupes RN MSN time***/Date***

## 2023-08-23 NOTE — Progress Notes (Signed)
Patient heart rate increased to 170s,pt was in the bathroom during the incident,pt taken back to bed, 12 lead EKG done shows SVT, Wayne,PA notified,pt converted back to sinus,vitals checked

## 2023-08-23 NOTE — Progress Notes (Addendum)
      [address]       [place] [address]             [phone]      4 Days Post-Op Procedure(s) (LRB): CORONARY ARTERY BYPASS GRAFTING (CABG) X THREE, USING LEFT INTERNAL MAMMARY ARTERY AND RIGHT GREATER SAPHENEOUS VEIN HARVESTED ENDOSCOPICLY (N/A) TRANSESOPHAGEAL ECHOCARDIOGRAM (N/A)  Subjective:  Patient's biggest complaint is constipation.  She states it has been 11 days since her last BM.[name] She has some nausea, denies vomiting.  She is passing gas.  Objective: Vital signs in last 24 hours: Temp:  [98.2 F (36.8 C)-98.7 F (37.1 C)] 98.4 F (36.9 C) (11/12 0423) Pulse Rate:  [81-89] 87 (11/12 0423) Cardiac Rhythm: Normal sinus rhythm (11/11 2000) Resp:  [20] 20 (11/12 0423) BP: (94-106)/(46-80) 95/56 (11/12 0423) SpO2:  [90 %-99 %] 93 % (11/12 0423) Weight:  [119.7 kg] 119.7 kg (11/12 0535)  Intake/Output from previous day: 11/11 0701 - 11/12 0700 In: 360 [P.O.:360] Out: -   General appearance: alert, cooperative, and no distress Heart: regular rate and rhythm Lungs: diminished breath sounds bibasilar Abdomen: soft, non tender, non-distended, hypoactive BS Extremities: edema trace Wound: clean and dry  Lab Results: Recent Labs    08/21/23 0205 08/22/23 0320  WBC 15.6* 10.2  HGB 10.8* 9.6*  HCT 33.6* 29.4*  PLT 141* 126*   BMET:  Recent Labs    08/21/23 0205 08/22/23 0320  NA 134* 135  K 4.2 3.7  CL 100 102  CO2 25 26  GLUCOSE 197* 133*  BUN 14 14  CREATININE 0.86 0.84  CALCIUM 8.3* 8.4*    PT/INR: No results for input(s): "LABPROT", "INR" in the last 72 hours. ABG    Component Value Date/Time   PHART 7.333 (L) 08/19/2023 1827   HCO3 24.8 08/19/2023 1827   TCO2 26 08/19/2023 1827   ACIDBASEDEF 1.0 08/19/2023 1827   O2SAT 94 08/19/2023 1827   CBG (last 3)  Recent Labs    08/22/23 1617 08/22/23 2117 08/23/23 0532  GLUCAP 164* 164* 142*    Assessment/Plan: S/P Procedure(s) (LRB): CORONARY ARTERY BYPASS GRAFTING  (CABG) X THREE, USING LEFT INTERNAL MAMMARY ARTERY AND RIGHT GREATER SAPHENEOUS VEIN HARVESTED ENDOSCOPICLY (N/A) TRANSESOPHAGEAL ECHOCARDIOGRAM (N/A)  CV- NSR, BP is low in the 90s- Lopressor 12.5 mg BID is ordered, however this has been administered Pulm- OSA, uses CPAP.. remains on oxygen, will wean as tolerated Renal- creatinine has been stable, BP at 100, will give IV lasix today, supplement K GI- constipation.. has received Mag Citrate w/o relief.. will give enema today, can't give reglan due to sensitivity Deconditioning- PT/OT recs CIR...will start authorization Dispo- patient stable, continue ambulation.. BP remains low and prohibitive of Lasix, try enema today last BM 11 days ago... PT/OT recs CIR   LOS: 8 days    Lowella Dandy, PA-C 08/23/2023  Patient seen and examined, agree with findings, plan outlined above  Viviann Spare C. Dorris Fetch, MD [facility] [phone]

## 2023-08-23 NOTE — Progress Notes (Signed)
CARDIAC REHAB PHASE I      Pt resting in bed, feeling tired from busy morning. Pt ambulated with PT and RN several times today. Reports tolerating ok, main complaint was ongoing pain. CIR work-up ongoing, pending approval. Post OHS education including site care, restrictions, heart healthy diabetic diet, sternal precautions, IS use at home, home needs at discharge, exercise guidelines, smoking cessation and CRP2 reviewed. All questions and concerns addressed. Will refer to [facility] for CRP2. Will continue to follow.   [id number] Woodroe Chen, RN BSN 08/23/2023 12:49 PM

## 2023-08-23 NOTE — Progress Notes (Signed)
Occupational Therapy Treatment Patient Details Name: Renee Ho MRN: [id number] DOB: Apr 19, 1976 Today's Date: 08/23/2023   History of present illness 47 y.o. female s/p CABG 11/8. hx of CAD s/p DES to Lcx (07/23/23), HLD, TIIDM, tobacco abuse, sleep apnea, HTN, anxiety/depression with recent admission for NSTEMI who presented with SOB, exercise intolerance and chest pain.   OT comments  Pt in bed upon therapy arrival. Pt continues to experience post op pain, decreased activity tolerance, and endurance. Provided education for self GI massage for constipation. OT demonstrated technique while pt completed steps with VC. Pt completed toileting and functional transfers during session requiring intermittent VC for sternal precaution carry over. Pain level during session seemed to effect memory and judgement. Pt was assisted back to bed and nursing was notified of request for pain medication. OT will continue to follow patient acutely.       If plan is discharge home, recommend the following:  A little help with walking and/or transfers;A lot of help with bathing/dressing/bathroom;Assistance with cooking/housework;Assist for transportation;Help with stairs or ramp for entrance   Equipment Recommendations  Other (comment);Tub/shower bench;BSC/3in1 (defer to next venue of care)       Precautions / Restrictions Precautions Precautions: Other (comment);Fall;Sternal Precaution Comments: cardiac/sternal precautions. Restrictions Weight Bearing Restrictions: Yes RUE Weight Bearing: Non weight bearing LUE Weight Bearing: Non weight bearing Other Position/Activity Restrictions: sternal precautions       Mobility Bed Mobility Overal bed mobility: Needs Assistance Bed Mobility: Supine to Sit, Sit to Supine, Sidelying to Sit   Sidelying to sit: HOB elevated, Min assist   Sit to supine: Supervision, HOB elevated, Used rails   General bed mobility comments: Able to roll to sidelying  without physical assist. Due to pain level, pt required physical assist to bring trunk up off HOB from elevated position. Pt return to bed from toilet by stepping her right up onto bed then slide hips over onto bed. Education provided regarding preferred technique of sitting first then log rolling to maintain sternal precautions and protect integrety of incision. Pt verbalized understanding and demonstrated recognition of what she did after the fact while verbalizing that she was told that during previous PT session today.    Transfers Overall transfer level: Needs assistance Equipment used: Rollator (4 wheels) Transfers: Sit to/from Stand, Bed to chair/wheelchair/BSC Sit to Stand: Contact guard assist, From elevated surface     Step pivot transfers: Contact guard assist     General transfer comment: VC for sternal precautions during functional transfer. Brakes on Rolator are not functioning and OT remained close to manage lines and stabilize Rolator.     Balance Overall balance assessment: Needs assistance Sitting-balance support: No upper extremity supported, Feet supported Sitting balance-Leahy Scale: Fair Sitting balance - Comments: sitting EOB   Standing balance support: During functional activity, Reliant on assistive device for balance, Bilateral upper extremity supported Standing balance-Leahy Scale: Poor          ADL either performed or assessed with clinical judgement   ADL Overall ADL's : Needs assistance/impaired     Grooming: Wash/dry hands;Supervision/safety;Standing      Toilet Transfer: Engineer, agricultural (4 wheels) Toilet Transfer Details (indicate cue type and reason): Decreased control noted during stand to sit transition although pt did remember to use arms crossed technique during sit<>stand transition. Pt was able to control sit to stand transition when finished. Toileting- Clothing Manipulation and Hygiene:  Supervision/safety;Sit to/from stand Toileting - Clothing Manipulation Details (indicate cue type and reason): Wiped  front to back to maintain sternal precautions. No clothing to manage              Cognition Arousal: Alert (Demonstrated pain throughout session closing eyes and focusing on breathing intermittently.) Behavior During Therapy: Flat affect (pain behavior) Overall Cognitive Status: Impaired/Different from baseline Area of Impairment: Memory, Safety/judgement      Memory: Decreased short-term memory   Safety/Judgement: Decreased awareness of safety     General Comments: Pt required education regarding bed mobility and sternal precautions after she returned from bathroom to the bed. Pain level effecting judgement and memory recall. Difficulty deciphering left from right when provided verbal instructions.        Exercises Other Exercises Other Exercises: Pt education provided on GI massage technique. Verbal education and tactile cues provided in order for pt to demonstrate understanding. Handout provided for reference. Pt encouraged to complete 1-2 times a day as needed.       General Comments VSS on 3L O2 [place]. SpO2 96%    Pertinent Vitals/ Pain       Pain Assessment Pain Assessment: 0-10 Pain Score: 8  Pain Location: chest through to back Pain Descriptors / Indicators: Pressure, Burning, Stabbing Pain Intervention(s): Patient requesting pain meds-RN notified, Monitored during session         Frequency  Min 1X/week        Progress Toward Goals  OT Goals(current goals can now be found in the care plan section)  Progress towards OT goals: Progressing toward goals            AM-PAC OT "6 Clicks" Daily Activity     Outcome Measure   Help from another person eating meals?: None Help from another person taking care of personal grooming?: A Little Help from another person toileting, which includes using toliet, bedpan, or urinal?: Total Help from  another person bathing (including washing, rinsing, drying)?: Total Help from another person to put on and taking off regular upper body clothing?: A Little Help from another person to put on and taking off regular lower body clothing?: Total 6 Click Score: 13    End of Session Equipment Utilized During Treatment: Oxygen;Rollator (4 wheels)  OT Visit Diagnosis: Unsteadiness on feet (R26.81);Muscle weakness (generalized) (M62.81);Pain;Other (comment) (decreased activity tolerance.) Pain - Right/Left:  (N/A) Pain - part of body:  (post op chest pain)   Activity Tolerance Patient limited by pain;Patient limited by fatigue   Patient Left in bed;with call bell/phone within reach;with bed alarm set   Nurse Communication Patient requests pain meds;Mobility status        Time: 1135-1158 OT Time Calculation (min): 23 min  Charges: OT General Charges $OT Visit: 1 Visit OT Treatments $Self Care/Home Management : 8-22 mins $Therapeutic Activity: 8-22 mins  Limmie Patricia, OTR/L,CBIS  Supplemental OT - [facility] and [facility] Secure Chat Preferred    Atharv Barriere, Charisse March 08/23/2023, 2:38 PM

## 2023-08-24 ENCOUNTER — Other Ambulatory Visit (HOSPITAL_COMMUNITY): Payer: Self-pay

## 2023-08-24 ENCOUNTER — Inpatient Hospital Stay (HOSPITAL_COMMUNITY)
Admission: AD | Admit: 2023-08-24 | Discharge: 2023-08-31 | DRG: 945 | Disposition: A | Discharge status: Home / Self Care | Payer: 59 | Source: Intra-hospital | Attending: Physical Medicine and Rehabilitation | Admitting: Physical Medicine and Rehabilitation

## 2023-08-24 ENCOUNTER — Encounter (HOSPITAL_COMMUNITY): Payer: Self-pay | Admitting: Physical Medicine and Rehabilitation

## 2023-08-24 ENCOUNTER — Other Ambulatory Visit: Payer: Self-pay

## 2023-08-24 DIAGNOSIS — Z888 Allergy status to other drugs, medicaments and biological substances status: Secondary | ICD-10-CM | POA: Diagnosis not present

## 2023-08-24 DIAGNOSIS — E7800 Pure hypercholesterolemia, unspecified: Secondary | ICD-10-CM | POA: Diagnosis present

## 2023-08-24 DIAGNOSIS — S4432XA Injury of axillary nerve, left arm, initial encounter: Secondary | ICD-10-CM

## 2023-08-24 DIAGNOSIS — F419 Anxiety disorder, unspecified: Secondary | ICD-10-CM | POA: Diagnosis present

## 2023-08-24 DIAGNOSIS — I2510 Atherosclerotic heart disease of native coronary artery without angina pectoris: Secondary | ICD-10-CM | POA: Diagnosis present

## 2023-08-24 DIAGNOSIS — R200 Anesthesia of skin: Secondary | ICD-10-CM | POA: Diagnosis present

## 2023-08-24 DIAGNOSIS — Z955 Presence of coronary angioplasty implant and graft: Secondary | ICD-10-CM

## 2023-08-24 DIAGNOSIS — E119 Type 2 diabetes mellitus without complications: Secondary | ICD-10-CM | POA: Diagnosis not present

## 2023-08-24 DIAGNOSIS — R0609 Other forms of dyspnea: Secondary | ICD-10-CM | POA: Diagnosis not present

## 2023-08-24 DIAGNOSIS — Z87891 Personal history of nicotine dependence: Secondary | ICD-10-CM

## 2023-08-24 DIAGNOSIS — I25709 Atherosclerosis of coronary artery bypass graft(s), unspecified, with unspecified angina pectoris: Secondary | ICD-10-CM | POA: Diagnosis not present

## 2023-08-24 DIAGNOSIS — D62 Acute posthemorrhagic anemia: Secondary | ICD-10-CM | POA: Diagnosis present

## 2023-08-24 DIAGNOSIS — I209 Angina pectoris, unspecified: Secondary | ICD-10-CM | POA: Diagnosis not present

## 2023-08-24 DIAGNOSIS — E1169 Type 2 diabetes mellitus with other specified complication: Secondary | ICD-10-CM

## 2023-08-24 DIAGNOSIS — R7989 Other specified abnormal findings of blood chemistry: Secondary | ICD-10-CM | POA: Diagnosis not present

## 2023-08-24 DIAGNOSIS — R5381 Other malaise: Secondary | ICD-10-CM | POA: Diagnosis not present

## 2023-08-24 DIAGNOSIS — E782 Mixed hyperlipidemia: Secondary | ICD-10-CM | POA: Diagnosis not present

## 2023-08-24 DIAGNOSIS — Z7984 Long term (current) use of oral hypoglycemic drugs: Secondary | ICD-10-CM

## 2023-08-24 DIAGNOSIS — I471 Supraventricular tachycardia, unspecified: Secondary | ICD-10-CM | POA: Diagnosis not present

## 2023-08-24 DIAGNOSIS — T82848A Pain from vascular prosthetic devices, implants and grafts, initial encounter: Secondary | ICD-10-CM | POA: Diagnosis not present

## 2023-08-24 DIAGNOSIS — F32A Depression, unspecified: Secondary | ICD-10-CM | POA: Diagnosis not present

## 2023-08-24 DIAGNOSIS — Z803 Family history of malignant neoplasm of breast: Secondary | ICD-10-CM

## 2023-08-24 DIAGNOSIS — M7989 Other specified soft tissue disorders: Secondary | ICD-10-CM | POA: Diagnosis not present

## 2023-08-24 DIAGNOSIS — G4733 Obstructive sleep apnea (adult) (pediatric): Secondary | ICD-10-CM | POA: Diagnosis present

## 2023-08-24 DIAGNOSIS — Z823 Family history of stroke: Secondary | ICD-10-CM

## 2023-08-24 DIAGNOSIS — I25110 Atherosclerotic heart disease of native coronary artery with unstable angina pectoris: Secondary | ICD-10-CM | POA: Diagnosis not present

## 2023-08-24 DIAGNOSIS — Z951 Presence of aortocoronary bypass graft: Secondary | ICD-10-CM

## 2023-08-24 DIAGNOSIS — K567 Ileus, unspecified: Secondary | ICD-10-CM | POA: Diagnosis not present

## 2023-08-24 DIAGNOSIS — I110 Hypertensive heart disease with heart failure: Secondary | ICD-10-CM | POA: Diagnosis present

## 2023-08-24 DIAGNOSIS — Z79899 Other long term (current) drug therapy: Secondary | ICD-10-CM | POA: Diagnosis not present

## 2023-08-24 DIAGNOSIS — S20219D Contusion of unspecified front wall of thorax, subsequent encounter: Secondary | ICD-10-CM | POA: Diagnosis not present

## 2023-08-24 DIAGNOSIS — R112 Nausea with vomiting, unspecified: Secondary | ICD-10-CM | POA: Diagnosis not present

## 2023-08-24 DIAGNOSIS — F17210 Nicotine dependence, cigarettes, uncomplicated: Secondary | ICD-10-CM | POA: Diagnosis present

## 2023-08-24 DIAGNOSIS — I959 Hypotension, unspecified: Secondary | ICD-10-CM | POA: Diagnosis present

## 2023-08-24 DIAGNOSIS — R531 Weakness: Secondary | ICD-10-CM | POA: Diagnosis present

## 2023-08-24 DIAGNOSIS — Z6841 Body Mass Index (BMI) 40.0 and over, adult: Secondary | ICD-10-CM | POA: Diagnosis not present

## 2023-08-24 DIAGNOSIS — Z90710 Acquired absence of both cervix and uterus: Secondary | ICD-10-CM

## 2023-08-24 DIAGNOSIS — B370 Candidal stomatitis: Secondary | ICD-10-CM | POA: Diagnosis not present

## 2023-08-24 DIAGNOSIS — Z7902 Long term (current) use of antithrombotics/antiplatelets: Secondary | ICD-10-CM | POA: Diagnosis not present

## 2023-08-24 DIAGNOSIS — S4400XD Injury of ulnar nerve at upper arm level, unspecified arm, subsequent encounter: Secondary | ICD-10-CM | POA: Diagnosis not present

## 2023-08-24 DIAGNOSIS — I5033 Acute on chronic diastolic (congestive) heart failure: Secondary | ICD-10-CM | POA: Diagnosis not present

## 2023-08-24 DIAGNOSIS — I10 Essential (primary) hypertension: Secondary | ICD-10-CM | POA: Diagnosis not present

## 2023-08-24 DIAGNOSIS — K560 Paralytic ileus: Secondary | ICD-10-CM | POA: Diagnosis not present

## 2023-08-24 DIAGNOSIS — T502X5A Adverse effect of carbonic-anhydrase inhibitors, benzothiadiazides and other diuretics, initial encounter: Secondary | ICD-10-CM | POA: Diagnosis not present

## 2023-08-24 DIAGNOSIS — Z818 Family history of other mental and behavioral disorders: Secondary | ICD-10-CM

## 2023-08-24 DIAGNOSIS — Z833 Family history of diabetes mellitus: Secondary | ICD-10-CM

## 2023-08-24 DIAGNOSIS — Z7982 Long term (current) use of aspirin: Secondary | ICD-10-CM

## 2023-08-24 DIAGNOSIS — B379 Candidiasis, unspecified: Secondary | ICD-10-CM | POA: Diagnosis not present

## 2023-08-24 DIAGNOSIS — E282 Polycystic ovarian syndrome: Secondary | ICD-10-CM | POA: Diagnosis present

## 2023-08-24 DIAGNOSIS — Z794 Long term (current) use of insulin: Secondary | ICD-10-CM

## 2023-08-24 DIAGNOSIS — Z83438 Family history of other disorder of lipoprotein metabolism and other lipidemia: Secondary | ICD-10-CM

## 2023-08-24 LAB — GLUCOSE, CAPILLARY
Glucose-Capillary: 133 mg/dL — ABNORMAL HIGH (ref 70–99)
Glucose-Capillary: 158 mg/dL — ABNORMAL HIGH (ref 70–99)
Glucose-Capillary: 155 mg/dL — ABNORMAL HIGH (ref 70–99)
Glucose-Capillary: 172 mg/dL — ABNORMAL HIGH (ref 70–99)

## 2023-08-24 LAB — COMPREHENSIVE METABOLIC PANEL
ALT: 9 U/L (ref 0–44)
AST: 21 U/L (ref 15–41)
Albumin: 2.9 g/dL — ABNORMAL LOW (ref 3.5–5.0)
Alkaline Phosphatase: 31 U/L — ABNORMAL LOW (ref 38–126)
Anion gap: 10 (ref 5–15)
BUN: 13 mg/dL (ref 6–20)
CO2: 29 mmol/L (ref 22–32)
Calcium: 8.3 mg/dL — ABNORMAL LOW (ref 8.9–10.3)
Chloride: 95 mmol/L — ABNORMAL LOW (ref 98–111)
Creatinine, Ser: 0.89 mg/dL (ref 0.44–1.00)
GFR, Estimated: >60 mL/min (ref >60)
Glucose, Bld: 143 mg/dL — ABNORMAL HIGH (ref 70–99)
Potassium: 3.6 mmol/L (ref 3.5–5.1)
Sodium: 134 mmol/L — ABNORMAL LOW (ref 135–145)
Total Bilirubin: 1.1 mg/dL (ref <1.2)
Total Protein: 6 g/dL — ABNORMAL LOW (ref 6.5–8.1)

## 2023-08-24 LAB — TSH: TSH: 2.174 u[IU]/mL (ref 0.350–4.500)

## 2023-08-24 LAB — MAGNESIUM: Magnesium: 2.2 mg/dL (ref 1.7–2.4)

## 2023-08-24 MED ORDER — ALUM & MAG HYDROXIDE-SIMETH 200-200-20 MG/5ML PO SUSP: 30.0000 mL | ORAL | Status: DC | PRN

## 2023-08-24 MED ORDER — EZETIMIBE 10 MG PO TABS
10.0000 mg | ORAL_TABLET | Freq: Every day | ORAL | Status: DC
  Administered 2023-08-25 – 2023-08-26 (×2): 10 mg via ORAL
  Filled 2023-08-24 (×2): qty 1

## 2023-08-24 MED ORDER — ICOSAPENT ETHYL 1 G PO CAPS: 2.0000 g | ORAL_CAPSULE | Freq: Two times a day (BID) | ORAL | Status: DC

## 2023-08-24 MED ORDER — ENOXAPARIN SODIUM 60 MG/0.6ML IJ SOSY: 60.0000 mg | PREFILLED_SYRINGE | Freq: Every day | INTRAMUSCULAR | Status: DC

## 2023-08-24 MED ORDER — PANTOPRAZOLE SODIUM 40 MG PO TBEC
40.0000 mg | DELAYED_RELEASE_TABLET | Freq: Every day | ORAL | Status: DC
  Administered 2023-08-25 – 2023-08-26 (×2): 40 mg via ORAL
  Filled 2023-08-24 (×2): qty 1

## 2023-08-24 MED ORDER — TRAMADOL HCL 50 MG PO TABS
50.0000 mg | ORAL_TABLET | ORAL | Status: DC | PRN
  Administered 2023-08-25 (×2): 100 mg via ORAL
  Administered 2023-08-25: 50 mg via ORAL
  Administered 2023-08-26: 100 mg via ORAL
  Filled 2023-08-24: qty 2
  Filled 2023-08-24: qty 1
  Filled 2023-08-24 (×3): qty 2

## 2023-08-24 MED ORDER — DOCUSATE SODIUM 100 MG PO CAPS
200.0000 mg | ORAL_CAPSULE | Freq: Every day | ORAL | Status: DC
  Administered 2023-08-25: 200 mg via ORAL
  Filled 2023-08-24: qty 2

## 2023-08-24 MED ORDER — CLOPIDOGREL BISULFATE 75 MG PO TABS: 75.0000 mg | ORAL_TABLET | Freq: Every day | ORAL | Status: DC

## 2023-08-24 MED ORDER — NYSTATIN 100000 UNIT/ML MT SUSP
5.0000 mL | Freq: Four times a day (QID) | OROMUCOSAL | Status: DC
Start: 2023-08-24 — End: 2023-08-26
  Administered 2023-08-24 – 2023-08-26 (×6): 500000 [IU] via ORAL
  Filled 2023-08-24 (×6): qty 5

## 2023-08-24 MED ORDER — CLOPIDOGREL BISULFATE 75 MG PO TABS
75.0000 mg | ORAL_TABLET | Freq: Every day | ORAL | Status: DC
  Administered 2023-08-25 – 2023-08-31 (×7): 75 mg via ORAL
  Filled 2023-08-24 (×7): qty 1

## 2023-08-24 MED ORDER — METOPROLOL TARTRATE 25 MG PO TABS: 12.5000 mg | ORAL_TABLET | Freq: Two times a day (BID) | ORAL | Status: DC

## 2023-08-24 MED ORDER — BISACODYL 5 MG PO TBEC
10.0000 mg | DELAYED_RELEASE_TABLET | Freq: Every day | ORAL | Status: DC
  Administered 2023-08-25 – 2023-08-28 (×4): 10 mg via ORAL
  Filled 2023-08-24 (×6): qty 2

## 2023-08-24 MED ORDER — AMIODARONE HCL 200 MG PO TABS
400.0000 mg | ORAL_TABLET | Freq: Two times a day (BID) | ORAL | Status: DC
  Administered 2023-08-24 – 2023-08-25 (×2): 400 mg via ORAL
  Filled 2023-08-24 (×2): qty 2

## 2023-08-24 MED ORDER — MELATONIN 3 MG PO TABS
3.0000 mg | ORAL_TABLET | Freq: Every evening | ORAL | Status: DC | PRN
  Administered 2023-08-24: 3 mg via ORAL
  Filled 2023-08-24: qty 1

## 2023-08-24 MED ORDER — ICOSAPENT ETHYL 1 G PO CAPS
2.0000 g | ORAL_CAPSULE | Freq: Two times a day (BID) | ORAL | Status: DC
  Administered 2023-08-24 – 2023-08-26 (×4): 2 g via ORAL
  Filled 2023-08-24 (×4): qty 2

## 2023-08-24 MED ORDER — FUROSEMIDE 40 MG PO TABS
40.0000 mg | ORAL_TABLET | Freq: Every day | ORAL | Status: DC
  Administered 2023-08-25 – 2023-08-26 (×2): 40 mg via ORAL
  Filled 2023-08-24 (×2): qty 1

## 2023-08-24 MED ORDER — FUROSEMIDE 40 MG PO TABS: 40.0000 mg | ORAL_TABLET | Freq: Every day | ORAL | Status: DC

## 2023-08-24 MED ORDER — POLYETHYLENE GLYCOL 3350 17 G PO PACK
17.0000 g | PACK | Freq: Two times a day (BID) | ORAL | Status: DC
  Administered 2023-08-24 – 2023-08-27 (×6): 17 g via ORAL
  Filled 2023-08-24 (×10): qty 1

## 2023-08-24 MED ORDER — ENOXAPARIN SODIUM 60 MG/0.6ML IJ SOSY
60.0000 mg | PREFILLED_SYRINGE | Freq: Every day | INTRAMUSCULAR | Status: DC
  Administered 2023-08-24 – 2023-08-30 (×7): 60 mg via SUBCUTANEOUS
  Filled 2023-08-24 (×7): qty 0.6

## 2023-08-24 MED ORDER — AMIODARONE HCL 200 MG PO TABS: ORAL_TABLET | ORAL | Status: DC

## 2023-08-24 MED ORDER — OXYCODONE HCL 5 MG PO TABS
5.0000 mg | ORAL_TABLET | ORAL | Status: DC | PRN
  Administered 2023-08-24 (×2): 10 mg via ORAL
  Administered 2023-08-25: 5 mg via ORAL
  Administered 2023-08-25 – 2023-08-26 (×6): 10 mg via ORAL
  Filled 2023-08-24 (×9): qty 2

## 2023-08-24 MED ORDER — OXYCODONE HCL 5 MG PO TABS: 5.0000 mg | ORAL_TABLET | Freq: Four times a day (QID) | ORAL | Status: DC | PRN

## 2023-08-24 MED ORDER — TIRZEPATIDE 7.5 MG/0.5ML ~~LOC~~ SOAJ
7.5000 mg | SUBCUTANEOUS | 1 refills | Status: DC
  Filled 2023-08-24 – 2023-09-07 (×2): qty 6, 84d supply, fill #0

## 2023-08-24 MED ORDER — METHOCARBAMOL 500 MG PO TABS
500.0000 mg | ORAL_TABLET | Freq: Four times a day (QID) | ORAL | Status: DC | PRN
  Administered 2023-08-24 – 2023-08-25 (×2): 500 mg via ORAL
  Filled 2023-08-24 (×3): qty 1

## 2023-08-24 MED ORDER — ATORVASTATIN CALCIUM 80 MG PO TABS
80.0000 mg | ORAL_TABLET | Freq: Every day | ORAL | Status: DC
  Administered 2023-08-25 – 2023-08-31 (×7): 80 mg via ORAL
  Filled 2023-08-24 (×7): qty 1

## 2023-08-24 MED ORDER — DULOXETINE HCL 60 MG PO CPEP
90.0000 mg | ORAL_CAPSULE | Freq: Every day | ORAL | Status: DC
  Administered 2023-08-25 – 2023-08-26 (×2): 90 mg via ORAL
  Filled 2023-08-24 (×2): qty 1

## 2023-08-24 MED ORDER — POTASSIUM CHLORIDE CRYS ER 20 MEQ PO TBCR
20.0000 meq | EXTENDED_RELEASE_TABLET | Freq: Once | ORAL | Status: AC
  Administered 2023-08-24: 20 meq via ORAL
  Filled 2023-08-24: qty 1

## 2023-08-24 MED ORDER — PROCHLORPERAZINE MALEATE 5 MG PO TABS
5.0000 mg | ORAL_TABLET | Freq: Four times a day (QID) | ORAL | Status: DC | PRN
  Administered 2023-08-24 – 2023-08-25 (×3): 10 mg via ORAL
  Filled 2023-08-24 (×3): qty 2

## 2023-08-24 MED ORDER — GUAIFENESIN-DM 100-10 MG/5ML PO SYRP: 10.0000 mL | ORAL_SOLUTION | Freq: Four times a day (QID) | ORAL | Status: DC | PRN

## 2023-08-24 MED ORDER — ASPIRIN 81 MG PO TBEC
81.0000 mg | DELAYED_RELEASE_TABLET | Freq: Every day | ORAL | Status: DC
  Administered 2023-08-25 – 2023-08-31 (×7): 81 mg via ORAL
  Filled 2023-08-24 (×7): qty 1

## 2023-08-24 MED ORDER — POTASSIUM CHLORIDE CRYS ER 20 MEQ PO TBCR: 20.0000 meq | EXTENDED_RELEASE_TABLET | Freq: Every day | ORAL | Status: DC

## 2023-08-24 MED ORDER — FUROSEMIDE 40 MG PO TABS
40.0000 mg | ORAL_TABLET | Freq: Every day | ORAL | Status: DC
  Administered 2023-08-24: 40 mg via ORAL
  Filled 2023-08-24: qty 1

## 2023-08-24 MED ORDER — BISACODYL 10 MG RE SUPP: 10.0000 mg | Freq: Every day | RECTAL | Status: DC

## 2023-08-24 MED ORDER — POTASSIUM CHLORIDE CRYS ER 20 MEQ PO TBCR
20.0000 meq | EXTENDED_RELEASE_TABLET | Freq: Every day | ORAL | Status: DC
  Administered 2023-08-25 – 2023-08-26 (×2): 20 meq via ORAL
  Filled 2023-08-24 (×2): qty 1

## 2023-08-24 MED ORDER — INSULIN ASPART 100 UNIT/ML IJ SOLN
0.0000 [IU] | Freq: Three times a day (TID) | INTRAMUSCULAR | Status: DC
  Administered 2023-08-25 (×2): 4 [IU] via SUBCUTANEOUS
  Administered 2023-08-25 – 2023-08-29 (×9): 2 [IU] via SUBCUTANEOUS

## 2023-08-24 NOTE — TOC CM/SW Note (Addendum)
Transition of Care [facility]) - Inpatient Brief Assessment Donn Pierini RN, BSN Transitions of Care Unit 4E- RN Case Manager See Treatment Team for direct phone #   Patient Details  Name: Renee Ho MRN: [id number] Date of Birth: Jul 09, 1976  Transition of Care [facility]) CM/SW Contact:    Darrold Span, RN Phone Number: 08/24/2023, 2:45 PM   Clinical Narrative: Pt s/p CABG, from home with family.  CIR following for potential admit - pending insurance approval.  1540- have been notified by CIR liaison- pt has received insurance approval for CIR and has bed available for admit today- pt will transition to CIR today.   TOC to follow- We will continue to monitor patient advancement through interdisciplinary progression rounds. If new patient transition needs arise, please place a TOC consult.   Transition of Care Asessment: Insurance and Status: Insurance coverage has been reviewed Patient has primary care physician: Yes Home environment has been reviewed: home w/ spouse Prior level of function:: independent Prior/Current Home Services: No current home services Social Determinants of Health Reivew: SDOH reviewed no interventions necessary Readmission risk has been reviewed: Yes Transition of care needs: no transition of care needs at this time

## 2023-08-24 NOTE — Progress Notes (Addendum)
      [address]       [place] [address]             [phone]        5 Days Post-Op Procedure(s) (LRB): CORONARY ARTERY BYPASS GRAFTING (CABG) X THREE, USING LEFT INTERNAL MAMMARY ARTERY AND RIGHT GREATER SAPHENEOUS VEIN HARVESTED ENDOSCOPICLY (N/A) TRANSESOPHAGEAL ECHOCARDIOGRAM (N/A)  Subjective: Patient sleeping but awakened. She had a bowel movement yesterday. She walked 3 times  Objective: Vital signs in last 24 hours: Temp:  [97.8 F (36.6 C)-98.6 F (37 C)] 97.9 F (36.6 C) (11/13 0411) Pulse Rate:  [77-169] 78 (11/13 0411) Cardiac Rhythm: Normal sinus rhythm (11/12 2000) Resp:  [18-20] 18 (11/13 0411) BP: (85-109)/(49-65) 109/59 (11/13 0411) SpO2:  [86 %-99 %] 98 % (11/13 0411) Weight:  [118.3 kg] 118.3 kg (11/13 0626)  Pre op weight 112.9 kg Current Weight  08/24/23 118.3 kg       Intake/Output from previous day: 11/12 0701 - 11/13 0700 In: 960 [P.O.:960] Out: -    Physical Exam:  Cardiovascular: RRR Pulmonary: Slightly diminished bibasilar breath sounds Abdomen: Soft, non tender, bowel sounds present. Extremities: Mild bilateral lower extremity edema. Wounds: Prevena intact. RLE wound is clean and dry.  No erythema or signs of infection.  Lab Results: CBC: Recent Labs    08/22/23 0320  WBC 10.2  HGB 9.6*  HCT 29.4*  PLT 126*   BMET:  Recent Labs    08/22/23 0320  NA 135  K 3.7  CL 102  CO2 26  GLUCOSE 133*  BUN 14  CREATININE 0.84  CALCIUM 8.4*    PT/INR:  Lab Results  Component Value Date   INR 1.3 (H) 08/19/2023   INR 1.0 08/18/2023   INR 0.9 12/19/2007   ABG:  INR: Will add last result for INR, ABG once components are confirmed Will add last 4 CBG results once components are confirmed  Assessment/Plan:  1. CV - SR. On Amiodarone 400 mg bid (run of SVT yesterday), Plavix 75 mg daily, Lopressor 12.5 mg bid (unable to titrate BB secondary to labile BP) 2.  Pulmonary - History of OSA and on CPAP  at night. Encourage incentive spirometer 3. Requires further diuresis as above pre op weight 4.  Expected post op acute blood loss anemia - Last H and h 9.6 and 29.4 5. CBGs 127/194/133. Pre op HGA1C   6. Mild thrombocytopenia-Last platelets 126,000 7. On Lovenox for DVT prophylaxis 8. Will remove Prevena later this am 9. Await cardiology evaluation;possible discharge to CIR today if bed available  Renee M ZimmermanPA-C 7:00 AM  Patient seen and examined, agree with above Can go to CIR when bed available  Renee Spare C. Dorris Fetch, MD [facility] [phone]

## 2023-08-24 NOTE — Progress Notes (Signed)
Mobility Specialist Progress Note:   08/24/23 1415  Mobility  Activity Ambulated with assistance in hallway  Level of Assistance Minimal assist, patient does 75% or more  Assistive Device Four wheel walker  Distance Ambulated (ft) 160 ft  RUE Weight Bearing NWB  LUE Weight Bearing NWB  Activity Response Tolerated well  Mobility Referral Yes  $Mobility charge 1 Mobility  Mobility Specialist Start Time (ACUTE ONLY) 1220  Mobility Specialist Stop Time (ACUTE ONLY) 1240  Mobility Specialist Time Calculation (min) (ACUTE ONLY) 20 min   Pre Mobility: 90 HR , 79%-85% SpO2 RA - 4 L During Mobility: 96 HR , 90% SpO2 4 L Post Mobility: 84 HR ,  91% SpO2 3 L  Pt received in bed, agreeable to mobility. Found on RA with SpO2 in 70's. Pt c/o SOB and nausea. Rated SOB 8/10. Pt ambulated on 4 L. SpO2 peaked at 90% on 4 L . During ambulation pt c/o chest pain and headache. Pt able to ambulate in hallway without break with CG for safety. Pt returned to bed with call bell in reach and all needs met. RN notified.   Leory Plowman  Mobility Specialist Please contact via [employer] office at [phone]

## 2023-08-24 NOTE — Progress Notes (Signed)
Occupational Therapy Treatment Patient Details Name: Renee Ho MRN: [id number] DOB: 1976/01/16 Today's Date: 08/24/2023   History of present illness 47 y.o. female s/p CABG 11/8. hx of CAD s/p DES to Lcx (07/23/23), HLD, TIIDM, tobacco abuse, sleep apnea, HTN, anxiety/depression with recent admission for NSTEMI who presented with SOB, exercise intolerance and chest pain.   OT comments  Pt in room with family visiting at bedside. Plan to discharge to CIR unit this afternoon and excited. Session focused on activity tolerance, endurance, carry over with sternal precautions, and pt education for breathing techniques.  Pt educated on pursed-lip breathing and diaphragmatic breathing technique to help SOB, increase needed oxygen into lungs, and help with decreasing fatigue level while increasing energy conservation. Pt also participated in activity tolerance and endurance task while packing up belongs to transfer to rehab unit. Required VC to bring awareness when breaking sternal precautions and provided with task modifications in order to complete task with precautions maintained. HR and SpO2 monitored throughout session.       If plan is discharge home, recommend the following:  A little help with bathing/dressing/bathroom;Assistance with cooking/housework;Assist for transportation;Help with stairs or ramp for entrance   Equipment Recommendations  Other (comment) (defer to next venue of care)    Recommendations for Other Services Rehab consult    Precautions / Restrictions Precautions Precautions: Sternal Precaution Comments: watch HR and RPE scale Restrictions Weight Bearing Restrictions: Yes RUE Weight Bearing: Non weight bearing LUE Weight Bearing: Non weight bearing Other Position/Activity Restrictions: sternal precautions       Mobility   Transfers Overall transfer level: Needs assistance Equipment used: None Transfers: Sit to/from Stand, Bed to  chair/wheelchair/BSC Sit to Stand: Supervision, From elevated surface     Step pivot transfers: Supervision, From elevated surface     General transfer comment: OT managed oxygen line and wound vac tubing through half of session. Wound vac was removed during session in which only oxygen line was needed managing.     Balance Overall balance assessment: Modified Independent Sitting-balance support: No upper extremity supported, Feet supported Sitting balance-Leahy Scale: Good Sitting balance - Comments: due to sternal precautions, limitations are evident when faced with challenges to balance.   Standing balance support: No upper extremity supported, During functional activity Standing balance-Leahy Scale: Good Standing balance comment: During session, pt focused on activity tolerance while navigating throughout room, packing up items before transfering to CIR unit. No device used. Able to weightshift within sternal precaution limitations.          ADL either performed or assessed with clinical judgement   ADL    General ADL Comments: Pt reports that NT set her up at the sink today and she completed a sponge bath and washed her hair at the sink. Pt verbalized how tiring it was and how much extra time was needed to complete a task that took only a short amount of time prior to surgery.      Cognition Arousal: Alert Behavior During Therapy: WFL for tasks assessed/performed Overall Cognitive Status: Impaired/Different from baseline Area of Impairment: Memory      Memory: Decreased recall of precautions, Decreased short-term memory   Safety/Judgement: Decreased awareness of deficits     General Comments: VC needed during session when sternal precautions were not being followed. VC provided to monitor exertion level and fatigue.        Exercises Other Exercises Other Exercises: Pt education provided on pursed lip breathing technique and diaphragmatic breathing while seated on  EOB. Verbal education on form and technique provided with visual demonstration. Pt reports difficulty with diaphragmatic breathing technique. Able to return demonstration of pursed lip breathing technique. Pt completed several rounds of each technique. During wound vac removal, pt was cued to utilized her new breathing technique to help with discomfort level.       General Comments SpO2 during functional activity in room (sitting, standing, walking): 93-95% on 3L O2 [place]. HR max while up and walking in room: 118bpm. seated at rest HR: 84 bpm.    Pertinent Vitals/ Pain       Pain Assessment Pain Assessment: Faces Faces Pain Scale: Hurts a little bit Pain Location: chest through to back Pain Descriptors / Indicators: Grimacing, Guarding, Discomfort         Frequency  Min 1X/week        Progress Toward Goals  OT Goals(current goals can now be found in the care plan section)  Progress towards OT goals: Progressing toward goals            AM-PAC OT "6 Clicks" Daily Activity     Outcome Measure   Help from another person eating meals?: None Help from another person taking care of personal grooming?: None Help from another person toileting, which includes using toliet, bedpan, or urinal?: A Little Help from another person bathing (including washing, rinsing, drying)?: A Little Help from another person to put on and taking off regular upper body clothing?: A Little Help from another person to put on and taking off regular lower body clothing?: A Little 6 Click Score: 20    End of Session Equipment Utilized During Treatment: Oxygen  OT Visit Diagnosis: Unsteadiness on feet (R26.81);Muscle weakness (generalized) (M62.81);Pain;Other (comment) (decreased activity tolerance) Pain - Right/Left:  (N/A) Pain - part of body:  (post op chest pain)   Activity Tolerance Patient tolerated treatment well   Patient Left in bed;with call bell/phone within reach;Other (comment);with  family/visitor present (seated EOB)   Nurse Communication Patient requests pain meds        Time: 5188-4166 OT Time Calculation (min): 24 min  Charges: OT General Charges $OT Visit: 1 Visit OT Treatments $Therapeutic Activity: 23-37 mins  Limmie Patricia, OTR/L,CBIS  Supplemental OT - [facility] and [facility] Secure Chat Preferred    Quincie Haroon, Charisse March 08/24/2023, 4:25 PM

## 2023-08-24 NOTE — Progress Notes (Signed)
CARDIAC REHAB PHASE I   PRE:  Rate/Rhythm: 86 SR    BP: sitting 100/60    SpO2: 88 RA  MODE:  Ambulation: 180 ft   POST:  Rate/Rhythm: 90 SR    BP: sitting 115/63     SpO2: 91 2L  Pt moved out of bed with her husband while I stepped out of room. Stood following sternal precautions. Walked hall with rollator and 2L O2. Progressing with tolerance, less rest, increased distance. Still needing reminders to relax shoulders and avoid using arms with bed mobility.   Reminders given for sternal precautions, CRPII. Sts she will never smoke again.  [id number]   Ethelda Chick BS, ACSM-CEP 08/24/2023 3:35 PM

## 2023-08-24 NOTE — H&P (Signed)
Physical Medicine and Rehabilitation Admission H&P   CC: Functional deficits secondary to CAD  HPI: Renee Ho is a 47 year old female with CAD who underwent CABG time 4 vessels by dr. Dorris Fetch on 08/19/2023. She had previously undergone stent placement to LCX on 07/23/2023 after presenting to the ED with sub-sternal chest pain. LVEF was 60-65% with moderate symmetric hypertrophy of septal segment and grad I diastolic dysfunction. Discharged on DAPT and to consider CABG if failed medically. Follows with Dr. Odis Hollingshead. She continued to be symptomatic with shortness of breath and exercise intolerance. Referral make to cardiothoracic surgery; however, she presented to the ED on 08/14/2023 with chest pain. Lab work unremarkable. EKG and troponins normal. PMH includes active sleep apnea on CPAP, polycystic ovarian syndrome, diabetes mellitus, hyper cholesterolemia, hypertension, depression/anxiety. History of tobacco abuse recently quit.  She was admitted by [facility] and cardiology consulted.  She continued to have exertional chest pain and dyspnea.  CRP was negative and no pericarditis.  Her BNP was mildly elevated at 119.  Medical management was limited due to low normal blood pressure and heart rate as well as dizziness.  She underwent cardiothoracic evaluation on 11/05 and agreed to proceed with CABG for residual LAD and diagonal disease.  She was transitioned from Brilinta to cangrelor intravenously.  On 11/08, she underwent coronary artery bypass grafting x 4 with vein harvest from right thigh. Postoperatively she had low BP and could not tolerate Lasix for diuresis. Poor PO intake. Reportedly had had no bowel movement in 11 days. Episode of SVT 11/12 while toileting lasting about 20 minutes. Cardiology re-consulted on 11/12.  Unable to tolerate beta-blocker dosage due to low blood pressure.  She was started on amiodarone with plans to continue 200 mg twice daily for 1 week before going to maintenance  dose of 200 mg daily.  Further episodes of SVT.  Reported BM on 11/12.  Now on aspirin and Plavix.  She reports pain and weakness in her left shoulder since her surgery.  She also has a burning sensation around her left shoulder.  The patient requires inpatient medicine and rehabilitation evaluations and services for ongoing dysfunction secondary to CAD.  Review of Systems  Constitutional:  Negative for chills, fever and malaise/fatigue.  HENT:  Negative for congestion and hearing loss.   Eyes:  Negative for blurred vision and double vision.  Respiratory:  Negative for shortness of breath.   Cardiovascular:  Positive for chest pain.  Gastrointestinal:  Positive for constipation and nausea. Negative for abdominal pain and vomiting.  Genitourinary: Negative.   Musculoskeletal:  Positive for joint pain.  Skin:  Negative for rash.  Neurological:  Positive for sensory change, focal weakness and weakness. Negative for dizziness.  Psychiatric/Behavioral:  The patient has insomnia.    Past Medical History:  Diagnosis Date   Depression    Diabetes mellitus    Diabetes mellitus without complication (HCC)    Diverticula of intestine    High cholesterol    Hypertension    OSA on CPAP    CPAP pressure= 15   Polycystic ovarian syndrome    Sleep apnea    on CPAP with pressure setting= 15   Past Surgical History:  Procedure Laterality Date   ABDOMINAL HYSTERECTOMY     CORONARY ARTERY BYPASS GRAFT N/A 08/19/2023   Procedure: CORONARY ARTERY BYPASS GRAFTING (CABG) X THREE, USING LEFT INTERNAL MAMMARY ARTERY AND RIGHT GREATER SAPHENEOUS VEIN HARVESTED ENDOSCOPICLY;  Surgeon: [name], MD;  Location: [facility] OR;  Service: Open Heart Surgery;  Laterality: N/A;   CORONARY STENT INTERVENTION N/A 07/23/2023   Procedure: CORONARY STENT INTERVENTION;  Surgeon: Kathleene Hazel, MD;  Location: [facility] INVASIVE CV LAB;  Service: Cardiovascular;  Laterality: N/A;   CYST REMOVAL NECK     KNEE  ARTHROSCOPY  07/11/2012   Procedure: ARTHROSCOPY KNEE;  Surgeon: Cammy Copa, MD;  Location: [facility] OR;  Service: Orthopedics;  Laterality: Right;  Right knee arthroscopy with debridement   KNEE SURGERY     LEFT HEART CATH AND CORONARY ANGIOGRAPHY N/A 07/23/2023   Procedure: LEFT HEART CATH AND CORONARY ANGIOGRAPHY;  Surgeon: Kathleene Hazel, MD;  Location: [facility] INVASIVE CV LAB;  Service: Cardiovascular;  Laterality: N/A;   OVARIAN CYST REMOVAL     TEE WITHOUT CARDIOVERSION N/A 08/19/2023   Procedure: TRANSESOPHAGEAL ECHOCARDIOGRAM;  Surgeon: [name], MD;  Location: [facility] OR;  Service: Open Heart Surgery;  Laterality: N/A;   WISDOM TOOTH EXTRACTION  05/11/2017   Family History  Problem Relation Age of Onset   Cancer Mother        breast   Mental illness Mother    Depression Mother    Hyperlipidemia Mother    Breast cancer Mother    Healthy Father    Hyperlipidemia Father    Diabetes Maternal Grandmother    Hyperlipidemia Maternal Grandmother    Cancer Maternal Grandfather        unknown type   Depression Maternal Grandfather    Hyperlipidemia Maternal Grandfather    Stroke Paternal Grandmother    Hyperlipidemia Paternal Grandmother    Hyperlipidemia Paternal Grandfather    Sleep apnea Neg Hx    Social History:  reports that she has been smoking cigarettes. She has a 10 pack-year smoking history. She has never used smokeless tobacco. She reports that she does not drink alcohol and does not use drugs. Allergies:  Allergies  Allergen Reactions   Morphine And Codeine Other (See Comments)    Migraines   Reglan [Metoclopramide] Other (See Comments)    Psychotic episodes   Chantix [Varenicline] Other (See Comments)    Makes the patient angry feeling   Toradol [Ketorolac Tromethamine] Rash   Medications Prior to Admission  Medication Sig Dispense Refill   amiodarone (PACERONE) 200 MG tablet Take 400 mg bid for 5 days;then take 200 mg bid for 7 days;then take 200  mg daily thereafter     aspirin EC 81 MG tablet Take 1 tablet (81 mg total) by mouth daily. Swallow whole. 30 tablet 0   atorvastatin (LIPITOR) 80 MG tablet Take 1 tablet (80 mg total) by mouth daily. 90 tablet 3   clopidogrel (PLAVIX) 75 MG tablet Take 1 tablet (75 mg total) by mouth daily.     dapagliflozin propanediol (FARXIGA) 10 MG TABS tablet Take 1 tablet (10 mg total) by mouth daily. 90 tablet 3   DULoxetine (CYMBALTA) 30 MG capsule Take 3 capsules (90 mg total) by mouth daily. 270 capsule 1   ezetimibe (ZETIA) 10 MG tablet Take 1 tablet (10 mg total) by mouth daily. 90 tablet 3   fenofibrate 160 MG tablet Take 1 tablet (160 mg total) by mouth daily. 90 tablet 3   [START ON 08/25/2023] furosemide (LASIX) 40 MG tablet Take 1 tablet (40 mg total) by mouth daily.     icosapent Ethyl (VASCEPA) 1 g capsule Take 2 capsules (2 g total) by mouth 2 (two) times daily.     insulin glargine-yfgn (SEMGLEE, YFGN,) 100 UNIT/ML Pen Inject  60 Units into the skin at bedtime. (Patient taking differently: Inject 100 Units into the skin at bedtime.)     Insulin Pen Needle 31G X 8 MM MISC Use as directed 100 each 3   metFORMIN (GLUCOPHAGE) 1000 MG tablet Take 1 tablet (1,000 mg total) by mouth 2 (two) times daily with a meal. 180 tablet 1   metoprolol tartrate (LOPRESSOR) 25 MG tablet Take 0.5 tablets (12.5 mg total) by mouth 2 (two) times daily.     oxyCODONE (OXY IR/ROXICODONE) 5 MG immediate release tablet Take 1 tablet (5 mg total) by mouth every 6 (six) hours as needed for severe pain (pain score 7-10).     potassium chloride SA (KLOR-CON M) 20 MEQ tablet Take 1 tablet (20 mEq total) by mouth daily.     PRESCRIPTION MEDICATION See admin instructions. CPAP- CONTINUOUS     [START ON 09/12/2023] tirzepatide (MOUNJARO) 7.5 MG/0.5ML Pen Inject 7.5 mg into the skin once a week. 6 mL 1          Home: Home Living Family/patient expects to be discharged to:: Private residence Living Arrangements:  Spouse/significant other Available Help at Discharge: Family, Available 24 hours/day Type of Home: House Home Access: Stairs to enter [employer] of Steps: 6 Entrance Stairs-Rails: Left Home Layout: One level Bathroom Shower/Tub: Associate Professor: Yes Home Equipment: None Additional Comments: Has Cpap machine  Lives With: Spouse   Functional History: Prior Function Prior Level of Function : Independent/Modified Independent, Working/employed, Driving ADLs Comments: NT on CIR   Functional Status:  Mobility: Bed Mobility Overal bed mobility: Needs Assistance Bed Mobility: Supine to Sit, Sit to Supine, Sidelying to Sit Rolling: Min assist Sidelying to sit: HOB elevated, Min assist Supine to sit: Mod assist Sit to supine: Supervision, HOB elevated, Used rails General bed mobility comments: Able to roll to sidelying without physical assist. Due to pain level, pt required physical assist to bring trunk up off HOB from elevated position. Pt return to bed from toilet by stepping her right up onto bed then slide hips over onto bed. Education provided regarding preferred technique of sitting first then log rolling to maintain sternal precautions and protect integrety of incision. Pt verbalized understanding and demonstrated recognition of what she did after the fact while verbalizing that she was told that during previous PT session today. Transfers Overall transfer level: Needs assistance Equipment used: Rollator (4 wheels) Transfers: Sit to/from Stand, Bed to chair/wheelchair/BSC Sit to Stand: Contact guard assist, From elevated surface Bed to/from chair/wheelchair/BSC transfer type:: Step pivot Step pivot transfers: Contact guard assist General transfer comment: VC for sternal precautions during functional transfer. Brakes on Rolator are not functioning and OT remained close to manage lines and stabilize  Rolator. Ambulation/Gait Ambulation/Gait assistance: Contact guard assist Gait Distance (Feet): 75 Feet Assistive device: Rollator (4 wheels) Gait Pattern/deviations: Step-through pattern, Decreased stride length General Gait Details: light use of rollator, pt with 3 standing rest breaks, c/o SOB due to pain with regular/deep breathing at incision site, SpO2 >94% on 4Lo2 via [place] Gait velocity: decreased Gait velocity interpretation: <1.31 ft/sec, indicative of household ambulator   ADL: ADL Overall ADL's : Needs assistance/impaired Eating/Feeding: Modified independent, Sitting Grooming: Wash/dry hands, Supervision/safety, Standing Grooming Details (indicate cue type and reason): limited by sternal precautions and decreased endurance Upper Body Bathing: Minimal assistance, Cueing for compensatory techniques, Sitting, Cueing for safety Upper Body Bathing Details (indicate cue type and reason): limited by sternal precautions Lower Body Bathing: Maximal assistance,  Sitting/lateral leans, Sit to/from stand, Cueing for compensatory techniques, Cueing for safety Lower Body Bathing Details (indicate cue type and reason): limited by sternal precautions Upper Body Dressing : Minimal assistance, Cueing for safety, Cueing for compensatory techniques, Cueing for UE precautions, Sitting Upper Body Dressing Details (indicate cue type and reason): limited by sternal precautions Lower Body Dressing: Total assistance Lower Body Dressing Details (indicate cue type and reason): limited by sternal precautions and pain Toilet Transfer: Contact guard assist, Ambulation, Regular Toilet, Rollator (4 wheels) Toilet Transfer Details (indicate cue type and reason): Decreased control noted during stand to sit transition although pt did remember to use arms crossed technique during sit<>stand transition. Pt was able to control sit to stand transition when finished. Toileting- Architect and Hygiene:  Supervision/safety, Sit to/from stand Toileting - Clothing Manipulation Details (indicate cue type and reason): Wiped front to back to maintain sternal precautions. No clothing to manage Functional mobility during ADLs: Contact guard assist, Rolling walker (2 wheels), Cueing for safety   Cognition: Cognition Overall Cognitive Status: Impaired/Different from baseline Orientation Level: Oriented X4 Cognition Arousal: Alert (Demonstrated pain throughout session closing eyes and focusing on breathing intermittently.) Behavior During Therapy: Flat affect (pain behavior) Overall Cognitive Status: Impaired/Different from baseline Area of Impairment: Memory, Safety/judgement Memory: Decreased short-term memory Safety/Judgement: Decreased awareness of safety General Comments: Pt required education regarding bed mobility and sternal precautions after she returned from bathroom to the bed. Pain level effecting judgement and memory recall. Difficulty deciphering left from right when provided verbal instructions.      Physical Exam: There were no vitals taken for this visit. Physical Exam  General: No apparent distress HEENT: Head is normocephalic, atraumatic, PERRLA, EOMI, sclera anicteric, oral mucosa pink and moist, + thrush  Neck: Supple without JVD or lymphadenopathy Heart: Reg rate and rhythm. No murmurs rubs or gallops Chest: CTA bilaterally without wheezes, rales, or rhonchi; no distress, 2 L O2 nasal cannula Abdomen: Soft, mildly-tender, mildly-distended, bowel sounds positive.  Obese. Extremities: No clubbing, cyanosis, or edema Psych: Pt's affect is appropriate. Pt is cooperative. Pleasant Skin: Sternal incision midline CDI, right leg donor site site without signs of infection.  Abdominal incisions CDI Neuro: Alert and oriented x 4, memory intact, normal insight and awareness, normal speech and language.  Cranial nerves II through XII intact Strength at least 4 out of 5 right upper  extremity Strength 4- out of 5 with left shoulder abduction to about 90 degrees but has trouble lifting further, distally 4 out of 5 Bilateral lower extremity strength 4 out of 5 Decreased sensation over lateral left shoulder Altered sensation digits 2 through 5 on the left hand and digits 2 through 4 on the right hand Musculoskeletal: Right lower extremity and chest wall tenderness present  Results for orders placed or performed during the hospital encounter of 08/14/23 (from the past 48 hour(s))  Glucose, capillary     Status: Abnormal   Collection Time: 08/22/23  9:17 PM  Result Value Ref Range   Glucose-Capillary 164 (H) 70 - 99 mg/dL    Comment: Glucose reference range applies only to samples taken after fasting for at least 8 hours.   Comment 1 Notify RN    Comment 2 Document in Chart   Glucose, capillary     Status: Abnormal   Collection Time: 08/23/23  5:32 AM  Result Value Ref Range   Glucose-Capillary 142 (H) 70 - 99 mg/dL    Comment: Glucose reference range applies only to samples taken after  fasting for at least 8 hours.   Comment 1 Notify RN    Comment 2 Document in Chart   Glucose, capillary     Status: Abnormal   Collection Time: 08/23/23 12:07 PM  Result Value Ref Range   Glucose-Capillary 143 (H) 70 - 99 mg/dL    Comment: Glucose reference range applies only to samples taken after fasting for at least 8 hours.  Glucose, capillary     Status: Abnormal   Collection Time: 08/23/23  4:57 PM  Result Value Ref Range   Glucose-Capillary 127 (H) 70 - 99 mg/dL    Comment: Glucose reference range applies only to samples taken after fasting for at least 8 hours.  Glucose, capillary     Status: Abnormal   Collection Time: 08/23/23  8:50 PM  Result Value Ref Range   Glucose-Capillary 194 (H) 70 - 99 mg/dL    Comment: Glucose reference range applies only to samples taken after fasting for at least 8 hours.   Comment 1 Notify RN    Comment 2 Document in Chart   Glucose,  capillary     Status: Abnormal   Collection Time: 08/24/23  5:52 AM  Result Value Ref Range   Glucose-Capillary 133 (H) 70 - 99 mg/dL    Comment: Glucose reference range applies only to samples taken after fasting for at least 8 hours.   Comment 1 Notify RN    Comment 2 Document in Chart   Comprehensive metabolic panel     Status: Abnormal   Collection Time: 08/24/23  9:57 AM  Result Value Ref Range   Sodium 134 (L) 135 - 145 mmol/L   Potassium 3.6 3.5 - 5.1 mmol/L   Chloride 95 (L) 98 - 111 mmol/L   CO2 29 22 - 32 mmol/L   Glucose, Bld 143 (H) 70 - 99 mg/dL    Comment: Glucose reference range applies only to samples taken after fasting for at least 8 hours.   BUN 13 6 - 20 mg/dL   Creatinine, Ser 8.46 0.44 - 1.00 mg/dL   Calcium 8.3 (L) 8.9 - 10.3 mg/dL   Total Protein 6.0 (L) 6.5 - 8.1 g/dL   Albumin 2.9 (L) 3.5 - 5.0 g/dL   AST 21 15 - 41 U/L   ALT 9 0 - 44 U/L   Alkaline Phosphatase 31 (L) 38 - 126 U/L   Total Bilirubin 1.1 <1.2 mg/dL   GFR, Estimated >96 >29 mL/min    Comment: (NOTE) Calculated using the CKD-EPI Creatinine Equation (2021)    Anion gap 10 5 - 15    Comment: Performed at [facility], [address], [place], [place] [address]  TSH     Status: None   Collection Time: 08/24/23  9:57 AM  Result Value Ref Range   TSH 2.174 0.350 - 4.500 uIU/mL    Comment: Performed by a 3rd Generation assay with a functional sensitivity of <=0.01 uIU/mL. Performed at [facility], [address], [place], [place] [address]   Magnesium     Status: None   Collection Time: 08/24/23  9:57 AM  Result Value Ref Range   Magnesium 2.2 1.7 - 2.4 mg/dL    Comment: Performed at [facility], [address] [place], [place] [address]  Glucose, capillary     Status: Abnormal   Collection Time: 08/24/23 11:28 AM  Result Value Ref Range   Glucose-Capillary 158 (H) 70 - 99 mg/dL    Comment:  Glucose reference range applies only to samples taken after fasting for at  least 8 hours.  Glucose, capillary     Status: Abnormal   Collection Time: 08/24/23  4:05 PM  Result Value Ref Range   Glucose-Capillary 155 (H) 70 - 99 mg/dL    Comment: Glucose reference range applies only to samples taken after fasting for at least 8 hours.   No results found.    There were no vitals taken for this visit.  Medical Problem List and Plan: 1. Functional deficits secondary to debility s/p  CABG due to CAD. sternal precautions  -patient may shower, cover incisions please  -ELOS/Goals: 7 to 10 days, mod I to supervision with PT and OT  -Admit to CIR  2.  Antithrombotics: -DVT/anticoagulation:  Pharmaceutical: Lovenox 60 mg q HS  -antiplatelet therapy: Aspirin and Plavix   3. Pain Management: Tylenol, oxycodone, tramadol as needed  4. Mood/Behavior/Sleep: LCSW to evaluate and provide emotional support  -continue Cymbalta 90 mg daily  -continue melatonin 3 mg q HS  -antipsychotic agents: n/a  5. Neuropsych/cognition: This patient is capable of making decisions on her own behalf.  6. Skin/Wound Care: Routine skin care checks   7. Fluids/Electrolytes/Nutrition: Routine Is and Os and follow-up chemistries  -continue Klor-con 20 mEq daily and follow-up BMP  8: Hypertension: monitor TID and prn  -Zestril, Lopressor held due to hypotension  -Lasix 40 mg daily resumed 11/13 (on Klor-con)  -BP well-controlled   9: Hyperlipidemia: continue statin, Zetia, Vascepa  10: Episode of SVT: started amiodarone 400 mg BID on 11/12 for one week, then 200 mg BID for one week then 200 mg daily  -follow-up with Dr. Odis Hollingshead  11: DM-2: CBGs QID; A1c = 6.5% (at home on Farxiga, [place], metformin and Mounjaro)  -continue SSI   12: Thrush: continue Nystatin  13: OSA-on CPAP requiring supplemental O2  14: ABLA: follow-up CBC.  Hemoglobin 9.6 on 11/11  15: Morbid Obesity BMI 43  -Dietary counseling  16: Left shoulder numbness and weakness.  Suspect she may have had axillary  nerve injury.  Continue to monitor for now     Milinda Antis, PA-C 08/24/2023   I have personally performed a face to face diagnostic evaluation of this patient and formulated the key components of the plan.  Additionally, I have personally reviewed laboratory data, imaging studies, as well as relevant notes and concur with the physician assistant's documentation above.  The patient's status has not changed from the original H&P.  Any changes in documentation from the acute care chart have been noted above.  Fanny Dance, MD, [place]

## 2023-08-24 NOTE — Progress Notes (Signed)
Inpatient Rehabilitation Admissions Coordinator   I have insurance approval and CIR bed to admit today. I met with patient at bedside and she is in agreement.  Acute team and TOC made aware. I will make the arrangements.  Ottie Glazier, RN, MSN Rehab Admissions Coordinator [phone] 08/24/2023 3:28 PM

## 2023-08-24 NOTE — Progress Notes (Signed)
   08/24/23 2057  BiPAP/CPAP/SIPAP  $ Non-Invasive Home Ventilator  Subsequent  BiPAP/CPAP/SIPAP Pt Type Adult  BiPAP/CPAP/SIPAP Resmed  Mask Type Full face mask  Respiratory Rate 16 breaths/min  Pressure Support 10 cmH20  PEEP 5 cmH20  Flow Rate 4 lpm  Patient Home Equipment  (home mask)  Auto Titrate Yes

## 2023-08-24 NOTE — Progress Notes (Signed)
Renee Oyster, MD  Physician Physical Medicine and Rehabilitation   Consult Note    Signed   Date of Service: 08/23/2023 11:32 AM  Related encounter: ED to Hosp-Admission (Current) from 08/14/2023 in [facility] 4E CV SURGICAL PROGRESSIVE CARE   Signed     Expand All Collapse All  Show:Clear all [x] Written[x] Templated[] Copied  Added by: [x] Renee Oyster, MD  [] Hover for details          Physical Medicine and Rehabilitation Consult Reason for Consult:debility after CABG Referring Physician: Dorris Fetch     HPI: Renee Ho is a 47 y.o. female with hx of NSTEMI 10/24, CAD, HTN, DM, OSA, morbid obesity who developed progressive chest pain and SOB and presented to ED on 11/3. Given recent admission, cath findings, and recurrent symptoms the patient opted for CABG x4 on 08/19/23 by Dr. Dorris Fetch. Post-operatively pt is experiencing expected chest discomfort as well pain in both shoulders/numbness in ~ ulnar half of each hand. Her appetite has been poor, and she's only had one bm since 11/3 which was this morning. Pt has been up with therapies and has had difficulties with transfers, bed mobility requiring min to mod assist. Once up she was able to ambulate contact guard 26' with rollator today. Her husband is very obese and unable to physically assist her at home due to his size and pain issues. Mother can provide supervision but no physical assistance. She lives in a one level home with 6 STE.     Review of Systems  Constitutional:  Positive for malaise/fatigue.  HENT: Negative.    Eyes: Negative.   Respiratory:  Positive for cough and shortness of breath.   Cardiovascular:  Positive for chest pain and leg swelling.  Gastrointestinal:  Positive for constipation and nausea.  Genitourinary: Negative.   Musculoskeletal:  Positive for joint pain.  Skin: Negative.   Neurological:  Positive for sensory change and weakness.  Psychiatric/Behavioral:  The patient  is nervous/anxious.         Past Medical History:  Diagnosis Date   Depression     Diabetes mellitus     Diabetes mellitus without complication (HCC)     Diverticula of intestine     High cholesterol     Hypertension     OSA on CPAP      CPAP pressure= 15   Polycystic ovarian syndrome     Sleep apnea      on CPAP with pressure setting= 15             Past Surgical History:  Procedure Laterality Date   ABDOMINAL HYSTERECTOMY       CORONARY ARTERY BYPASS GRAFT N/A 08/19/2023    Procedure: CORONARY ARTERY BYPASS GRAFTING (CABG) X THREE, USING LEFT INTERNAL MAMMARY ARTERY AND RIGHT GREATER SAPHENEOUS VEIN HARVESTED ENDOSCOPICLY;  Surgeon: [name], MD;  Location: [facility] OR;  Service: Open Heart Surgery;  Laterality: N/A;   CORONARY STENT INTERVENTION N/A 07/23/2023    Procedure: CORONARY STENT INTERVENTION;  Surgeon: Kathleene Hazel, MD;  Location: [facility] INVASIVE CV LAB;  Service: Cardiovascular;  Laterality: N/A;   CYST REMOVAL NECK       KNEE ARTHROSCOPY   07/11/2012    Procedure: ARTHROSCOPY KNEE;  Surgeon: Cammy Copa, MD;  Location: [facility] OR;  Service: Orthopedics;  Laterality: Right;  Right knee arthroscopy with debridement   KNEE SURGERY       LEFT HEART CATH AND CORONARY ANGIOGRAPHY N/A 07/23/2023    Procedure: LEFT  HEART CATH AND CORONARY ANGIOGRAPHY;  Surgeon: Kathleene Hazel, MD;  Location: [facility] INVASIVE CV LAB;  Service: Cardiovascular;  Laterality: N/A;   OVARIAN CYST REMOVAL       TEE WITHOUT CARDIOVERSION N/A 08/19/2023    Procedure: TRANSESOPHAGEAL ECHOCARDIOGRAM;  Surgeon: [name], MD;  Location: [facility] OR;  Service: Open Heart Surgery;  Laterality: N/A;   WISDOM TOOTH EXTRACTION   05/11/2017             Family History  Problem Relation Age of Onset   Cancer Mother          breast   Mental illness Mother     Depression Mother     Hyperlipidemia Mother     Breast cancer Mother     Healthy Father     Hyperlipidemia Father      Diabetes Maternal Grandmother     Hyperlipidemia Maternal Grandmother     Cancer Maternal Grandfather          unknown type   Depression Maternal Grandfather     Hyperlipidemia Maternal Grandfather     Stroke Paternal Grandmother     Hyperlipidemia Paternal Grandmother     Hyperlipidemia Paternal Grandfather     Sleep apnea Neg Hx          Social History:  reports that she has been smoking cigarettes. She has a 10 pack-year smoking history. She has never used smokeless tobacco. She reports that she does not drink alcohol and does not use drugs. Allergies:  Allergies       Allergies  Allergen Reactions   Morphine And Codeine Other (See Comments)      Migraines   Reglan [Metoclopramide] Other (See Comments)      Psychotic episodes   Chantix [Varenicline] Other (See Comments)      Makes the patient angry feeling   Toradol [Ketorolac Tromethamine] Rash            Medications Prior to Admission  Medication Sig Dispense Refill   aspirin EC 81 MG tablet Take 1 tablet (81 mg total) by mouth daily. Swallow whole. 30 tablet 0   atorvastatin (LIPITOR) 80 MG tablet Take 1 tablet (80 mg total) by mouth daily. 90 tablet 3   dapagliflozin propanediol (FARXIGA) 10 MG TABS tablet Take 1 tablet (10 mg total) by mouth daily. 90 tablet 3   DULoxetine (CYMBALTA) 30 MG capsule Take 3 capsules (90 mg total) by mouth daily. 270 capsule 1   ezetimibe (ZETIA) 10 MG tablet Take 1 tablet (10 mg total) by mouth daily. 90 tablet 3   fenofibrate 160 MG tablet Take 1 tablet (160 mg total) by mouth daily. 90 tablet 3   hydrochlorothiazide (HYDRODIURIL) 25 MG tablet Take 1 tablet (25 mg total) by mouth daily. 90 tablet 2   insulin glargine-yfgn (SEMGLEE, YFGN,) 100 UNIT/ML Pen Inject 60 Units into the skin at bedtime. (Patient taking differently: Inject 100 Units into the skin at bedtime.)       Insulin Pen Needle 31G X 8 MM MISC Use as directed 100 each 3   isosorbide mononitrate (IMDUR) 30 MG 24 hr tablet  Take 1 tablet (30 mg total) by mouth daily. 90 tablet 3   lisinopril (ZESTRIL) 5 MG tablet Take 1 tablet (5 mg total) by mouth daily. 90 tablet 3   metFORMIN (GLUCOPHAGE) 1000 MG tablet Take 1 tablet (1,000 mg total) by mouth 2 (two) times daily with a meal. 180 tablet 1   metoprolol tartrate (LOPRESSOR)  25 MG tablet Take 1 tablet (25 mg total) by mouth 2 (two) times daily. 180 tablet 3   ticagrelor (BRILINTA) 90 MG TABS tablet Take 1 tablet (90 mg total) by mouth 2 (two) times daily. (Patient taking differently: Take 90 mg by mouth 2 (two) times daily. Take on Sundays) 180 tablet 3   tirzepatide (MOUNJARO) 7.5 MG/0.5ML Pen Inject 7.5 mg into the skin once a week. 6 mL 1   PRESCRIPTION MEDICATION See admin instructions. CPAP- CONTINUOUS              Home: Home Living Family/patient expects to be discharged to:: Private residence Living Arrangements: Spouse/significant other Available Help at Discharge: Family, Available 24 hours/day Type of Home: House Home Access: Stairs to enter [employer] of Steps: 6 Entrance Stairs-Rails: Left Home Layout: One level Bathroom Shower/Tub: Associate Professor: Yes Home Equipment: None Additional Comments: Has Cpap machine  Lives With: Spouse  Functional History: Prior Function Prior Level of Function : Independent/Modified Independent, Working/employed, Driving ADLs Comments: NT on CIR Functional Status:  Mobility: Bed Mobility Overal bed mobility: Needs Assistance Bed Mobility: Rolling, Sidelying to Sit Rolling: Min assist Supine to sit: Mod assist Sit to supine: Min assist General bed mobility comments: HOB elevated, pt iniated rolling to the L, asked for assist for trunk elevation Transfers Overall transfer level: Needs assistance Equipment used: Rolling walker (2 wheels) Transfers: Sit to/from Stand, Bed to chair/wheelchair/BSC Sit to Stand: Min assist Bed to/from  chair/wheelchair/BSC transfer type:: Step pivot Step pivot transfers: Contact guard assist General transfer comment: VC for sternal precautions and education on compensatory technique of crossed arms over chest to maintain sternal precautions with and without use of pillow, minA to steady descent to low toliet surface height Ambulation/Gait Ambulation/Gait assistance: Contact guard assist Gait Distance (Feet): 75 Feet Assistive device: Rollator (4 wheels) Gait Pattern/deviations: Step-through pattern, Decreased stride length General Gait Details: light use of rollator, pt with 3 standing rest breaks, c/o SOB due to pain with regular/deep breathing at incision site, SpO2 >94% on 4Lo2 via [place] Gait velocity: decreased Gait velocity interpretation: <1.31 ft/sec, indicative of household ambulator   ADL: ADL Overall ADL's : Needs assistance/impaired Eating/Feeding: Modified independent, Sitting Grooming: Wash/dry face, Oral care, Brushing hair, Set up, Sitting Grooming Details (indicate cue type and reason): limited by sternal precautions and decreased endurance Upper Body Bathing: Minimal assistance, Cueing for compensatory techniques, Sitting, Cueing for safety Upper Body Bathing Details (indicate cue type and reason): limited by sternal precautions Lower Body Bathing: Maximal assistance, Sitting/lateral leans, Sit to/from stand, Cueing for compensatory techniques, Cueing for safety Lower Body Bathing Details (indicate cue type and reason): limited by sternal precautions Upper Body Dressing : Minimal assistance, Cueing for safety, Cueing for compensatory techniques, Cueing for UE precautions, Sitting Upper Body Dressing Details (indicate cue type and reason): limited by sternal precautions Lower Body Dressing: Total assistance Lower Body Dressing Details (indicate cue type and reason): limited by sternal precautions and pain Toilet Transfer: Ambulation, Rolling walker (2 wheels), Regular Toilet,  Minimal assistance Toilet Transfer Details (indicate cue type and reason): limited by sternal precautions Toileting- Clothing Manipulation and Hygiene: Total assistance, Sit to/from stand, Cueing for safety, Cueing for compensatory techniques Toileting - Clothing Manipulation Details (indicate cue type and reason): limited by sternal precautions Functional mobility during ADLs: Contact guard assist, Rolling walker (2 wheels), Cueing for safety   Cognition: Cognition Overall Cognitive Status: Within Functional Limits for tasks assessed Orientation Level: Oriented X4 Cognition  Arousal: Alert Behavior During Therapy: Lability Overall Cognitive Status: Within Functional Limits for tasks assessed General Comments: pt able to recall precautions but requires verbal cues to adhere functionally, pt became emotional during ambulation due to life altering event   Blood pressure (!) 102/49, pulse 79, temperature 98.3 F (36.8 C), temperature source Axillary, resp. rate 20, height 5\' 5"  (1.651 m), weight 119.7 kg, SpO2 97%. Physical Exam Constitutional:      General: She is not in acute distress.    Appearance: She is obese.  HENT:     Right Ear: External ear normal.     Left Ear: External ear normal.  Eyes:     Pupils: Pupils are equal, round, and reactive to light.  Cardiovascular:     Rate and Rhythm: Normal rate and regular rhythm.  Pulmonary:     Effort: Pulmonary effort is normal.  Abdominal:     General: There is distension.     Palpations: Abdomen is soft.  Musculoskeletal:        General: Swelling and tenderness (RLE and chest wall) present.     Cervical back: Normal range of motion and neck supple.     Comments: Sternal and RLE discomfort. Pain with bilateral shoulder ER/ER and ABD  Skin:    Comments: Sternal wound with vac in place, no drainage in cannister. Right leg donor sites dressed, dry.   Neurological:     Mental Status: She is alert.     Comments: Alert and oriented x  3. Normal insight and awareness. Intact Memory. Normal language and speech. Cranial nerve exam unremarkable. MMT: 4/5 prox UE's to 4- distally with grip. RLE limited by pain. LLE 4/5. Decreased LT in ~ulnar half of both hands (digits 2-5).  No abnormal tone  Psychiatric:        Mood and Affect: Mood normal.        Behavior: Behavior normal.        Lab Results Last 24 Hours       Results for orders placed or performed during the hospital encounter of 08/14/23 (from the past 24 hour(s))  Glucose, capillary     Status: Abnormal    Collection Time: 08/22/23 12:30 PM  Result Value Ref Range    Glucose-Capillary 164 (H) 70 - 99 mg/dL  Glucose, capillary     Status: Abnormal    Collection Time: 08/22/23  4:17 PM  Result Value Ref Range    Glucose-Capillary 164 (H) 70 - 99 mg/dL  Glucose, capillary     Status: Abnormal    Collection Time: 08/22/23  9:17 PM  Result Value Ref Range    Glucose-Capillary 164 (H) 70 - 99 mg/dL    Comment 1 Notify RN      Comment 2 Document in Chart    Glucose, capillary     Status: Abnormal    Collection Time: 08/23/23  5:32 AM  Result Value Ref Range    Glucose-Capillary 142 (H) 70 - 99 mg/dL    Comment 1 Notify RN      Comment 2 Document in Chart         Imaging Results (Last 48 hours)  DG Chest 2 View   Result Date: 08/22/2023 CLINICAL DATA:  Follow-up atelectasis EXAM: CHEST - 2 VIEW COMPARISON:  08/21/2023, 08/20/2023, 08/15/2023 FINDINGS: Post sternotomy changes. Small bilateral pleural effusions. Enlarged cardiomediastinal silhouette with mild central congestion. Subsegmental atelectasis in the left lung base. Removal of lower chest drainage catheters. No convincing pneumothorax is seen.  Removal of right IJ catheter sheath. IMPRESSION: 1. Subsegmental atelectasis in the left lower lung. Removal of right IJ catheter sheath and chest drainage tubes. 2. Enlarged cardiomediastinal silhouette with mild central congestion and small pleural effusions.  Electronically Signed   By: Jasmine Pang M.D.   On: 08/22/2023 15:11       Assessment/Plan: Diagnosis: 47 yo morbidly obese female with debility after CABG.  Mobility inhibited by bilateral shoulder pain (?RTC strain) and numbness in ulnar hand, both of which are new. Does the need for close, 24 hr/day medical supervision in concert with the patient's rehab needs make it unreasonable for this patient to be served in a less intensive setting? Yes Co-Morbidities requiring supervision/potential complications:  -morbid obesity -pain control -wound care -diabetes Due to bladder management, bowel management, safety, skin/wound care, disease management, medication administration, pain management, and patient education, does the patient require 24 hr/day rehab nursing? Yes Does the patient require coordinated care of a physician, rehab nurse, therapy disciplines of PT, OT to address physical and functional deficits in the context of the above medical diagnosis(es)? Yes Addressing deficits in the following areas: balance, endurance, locomotion, strength, transferring, bowel/bladder control, bathing, dressing, feeding, grooming, toileting, and psychosocial support Can the patient actively participate in an intensive therapy program of at least 3 hrs of therapy per day at least 5 days per week? Yes The potential for patient to make measurable gains while on inpatient rehab is excellent Anticipated functional outcomes upon discharge from inpatient rehab are modified independent  with PT, modified independent with OT, n/a with SLP. Estimated rehab length of stay to reach the above functional goals is: 7 days Anticipated discharge destination: Home Overall Rehab/Functional Prognosis: excellent   POST ACUTE RECOMMENDATIONS: This patient's condition is appropriate for continued rehabilitative care in the following setting: CIR Patient has agreed to participate in recommended program. Yes Note that insurance  prior authorization may be required for reimbursement for recommended care.   Comment: Pt is extremely motivated to regain functional independence. She was very active prior to her recent cardiac issues. Neither husband or mom can provide physical assist at home. Rehab Admissions Coordinator to follow up.             I have personally performed a face to face diagnostic evaluation of this patient. Additionally, I have examined the patient's medical record including any pertinent labs and radiographic images. If the physician assistant has documented in this note, I have reviewed and edited or otherwise concur with the physician assistant's documentation.   Thanks,   Renee Oyster, MD 08/23/2023           Routing History

## 2023-08-24 NOTE — Progress Notes (Signed)
Fanny Dance, MD  Physician Physical Medicine and Rehabilitation   PMR Pre-admission    Signed   Date of Service: 08/23/2023 11:02 AM  Related encounter: ED to Hosp-Admission (Current) from 08/14/2023 in [facility] 4E CV SURGICAL PROGRESSIVE CARE   Signed     Expand All Collapse All  Show:Clear all [x] Written[x] Templated[] Copied  Added by: [x] Standley Brooking, RN  [] Hover for details PMR Admission Coordinator Pre-Admission Assessment   Patient: Renee Ho is an 47 y.o., female MRN: [id number] DOB: Oct 18, 1975 Height: 5\' 5"  (165.1 cm) Weight: 119.7 kg                                                                                                                                                  Insurance Information HMO:     PPO:      PCP:      IPA:      80/20:      OTHER: EPO; Yes PRIMARY: [employer]      Policy#: [id number]      Subscriber: pt CM Name: Dondra Spry      Phone#: [phone]     Fax#: [phone] Pre-Cert#: [id number] approved for 7 days 11/13 until 11/19 with update 11/20      Employer: [employer] Benefits:  Phone #: [phone]     Name: 11/12 Eff. Date: 10/11/22     Deduct: $100      Out of Pocket Max: $2500      Life Max: none  CIR: $1150 co pay per admission      SNF: 80% 120 days per year Outpatient: 80%     Co-Pay: 20% Home Health: 80%      Co-Pay: 20% DME: 80%     Co-Pay: 20% Providers: in network  SECONDARY: none   Financial Counselor:       Phone#:    The Data processing manager" for patients in Inpatient Rehabilitation Facilities with attached "Privacy Act Statement-Health Care Records" was provided and verbally reviewed with: N/A   Emergency Contact Information Contact Information       Name Relation Home Work Mobile    [place] Daughter     [phone]    Wright,Clarence Spouse     [phone]         Other Contacts   None on File      Current Medical History  Patient Admitting Diagnosis: CABG,  debility   History of Present Illness: 47 year old female with history of CAD s/p NSTEMI s/p PCI with stent to the left circumflex on 10/22/22, type 2 DM, HTN HLD< anxiety, OSA and tobacco abuse who was admitted to [facility] on 08/14/23 with chest pain. Recent cath with a notable lesion within the LAD, that was not amenable to PCI/stent placement., Rather it was recommended for medical management, Placed on baby ASA as well  as Brilinta, high intensity atorvastatin, lisinopril  and metoprolol tartrate. Despite compliance, presented with chest pain.    CVTS consulted as well as Cardiology. Placed on Cangrelor IV to bridge to surgery.  Underwent CABG x 3 on 11/8. Post operative pain management, constipation and nausea. BP low, Remains on O2 at 4 liters [place], IV lasix given for diuresis.    Patient's medical record from [facility] has been reviewed by the rehabilitation admission coordinator and physician.   Past Medical History      Past Medical History:  Diagnosis Date   Depression     Diabetes mellitus     Diabetes mellitus without complication (HCC)     Diverticula of intestine     High cholesterol     Hypertension     OSA on CPAP      CPAP pressure= 15   Polycystic ovarian syndrome     Sleep apnea      on CPAP with pressure setting= 15        Has the patient had major surgery during 100 days prior to admission? Yes   Family History  family history includes Breast cancer in her mother; Cancer in her maternal grandfather and mother; Depression in her maternal grandfather and mother; Diabetes in her maternal grandmother; Healthy in her father; Hyperlipidemia in her father, maternal grandfather, maternal grandmother, mother, paternal grandfather, and paternal grandmother; Mental illness in her mother; Stroke in her paternal grandmother.   Current Medications   Current Medications    Current Facility-Administered Medications:    acetaminophen (TYLENOL) tablet 1,000 mg,  1,000 mg, Oral, Q6H, 1,000 mg at 08/23/23 0545 **OR** acetaminophen (TYLENOL) 160 MG/5ML solution 1,000 mg, 1,000 mg, Per Tube, Q6H, Joycelyn Man, Donielle M, PA-C   aspirin EC tablet 81 mg, 81 mg, Oral, Daily, Laverda Page B, NP, 81 mg at 08/23/23 0952   atorvastatin (LIPITOR) tablet 80 mg, 80 mg, Oral, Daily, Doree Fudge M, PA-C, 80 mg at 08/23/23 0865   bisacodyl (DULCOLAX) EC tablet 10 mg, 10 mg, Oral, Daily, 10 mg at 08/23/23 0953 **OR** bisacodyl (DULCOLAX) suppository 10 mg, 10 mg, Rectal, Daily, Zimmerman, Donielle M, PA-C   Chlorhexidine Gluconate Cloth 2 % PADS 6 each, 6 each, Topical, Daily, [name], MD, 6 each at 08/21/23 0956   clopidogrel (PLAVIX) tablet 75 mg, 75 mg, Oral, Daily, Laverda Page B, NP, 75 mg at 08/23/23 0952   dextrose 50 % solution 0-50 mL, 0-50 mL, Intravenous, PRN, Doree Fudge M, PA-C   docusate sodium (COLACE) capsule 200 mg, 200 mg, Oral, Daily, Doree Fudge M, PA-C, 200 mg at 08/23/23 0953   DULoxetine (CYMBALTA) DR capsule 90 mg, 90 mg, Oral, Daily, Doree Fudge M, PA-C, 90 mg at 08/23/23 0952   enoxaparin (LOVENOX) injection 60 mg, 60 mg, Subcutaneous, QHS, Lightfoot, Harrell O, MD, 60 mg at 08/22/23 2107   ezetimibe (ZETIA) tablet 10 mg, 10 mg, Oral, Daily, Doree Fudge M, PA-C, 10 mg at 08/23/23 7846   icosapent Ethyl (VASCEPA) 1 g capsule 2 g, 2 g, Oral, BID, Laverda Page B, NP, 2 g at 08/23/23 0953   CBG monitoring, , , 4x Daily, AC & HS **AND** insulin aspart (novoLOG) injection 0-24 Units, 0-24 Units, Subcutaneous, TID WC, [name], MD, 2 Units at 08/23/23 9629   melatonin tablet 3 mg, 3 mg, Oral, QHS PRN, Doree Fudge M, PA-C, 3 mg at 08/22/23 2106   metoprolol tartrate (LOPRESSOR) tablet 12.5 mg, 12.5 mg, Oral, BID, 12.5  mg at 08/21/23 0952 **OR** metoprolol tartrate (LOPRESSOR) 25 mg/10 mL oral suspension 12.5 mg, 12.5 mg, Per Tube, BID, Zimmerman, Donielle M, PA-C   metoprolol  tartrate (LOPRESSOR) injection 2.5-5 mg, 2.5-5 mg, Intravenous, Q2H PRN, Doree Fudge M, PA-C   ondansetron [facility]) injection 4 mg, 4 mg, Intravenous, Q6H PRN, Doree Fudge M, PA-C, 4 mg at 08/23/23 9811   oxyCODONE (Oxy IR/ROXICODONE) immediate release tablet 5-10 mg, 5-10 mg, Oral, Q4H PRN, Barrett, Erin R, PA-C, 10 mg at 08/23/23 0803   pantoprazole (PROTONIX) EC tablet 40 mg, 40 mg, Oral, Daily, Doree Fudge M, PA-C, 40 mg at 08/23/23 9147   potassium chloride SA (KLOR-CON M) CR tablet 20 mEq, 20 mEq, Oral, Daily, Barrett, Erin R, PA-C, 20 mEq at 08/23/23 0952   sodium chloride flush (NS) 0.9 % injection 10 mL, 10 mL, Intravenous, Q12H, [name], MD, 10 mL at 08/22/23 1122   sodium chloride flush (NS) 0.9 % injection 3 mL, 3 mL, Intravenous, Q12H, Zimmerman, Donielle M, PA-C, 3 mL at 08/23/23 1000   sodium chloride flush (NS) 0.9 % injection 3 mL, 3 mL, Intravenous, PRN, Joycelyn Man, Donielle M, PA-C   sorbitol, magnesium hydroxide, mineral oil, glycerin (SMOG) enema, 960 mL, Rectal, Once, Barrett, Erin R, PA-C   traMADol (ULTRAM) tablet 50-100 mg, 50-100 mg, Oral, Q4H PRN, Doree Fudge M, PA-C, 100 mg at 08/23/23 0919     Patients Current Diet:  Diet Order                  Diet Carb Modified Fluid consistency: Thin; Room service appropriate? Yes  Diet effective now                       Precautions / Restrictions Precautions Precautions: Other (comment), Fall, Sternal Precaution Comments: cardiac/sternal precautions. Reviewed precautions during evaluation with handout provided for reference. wound vac to sternum Restrictions Weight Bearing Restrictions: Yes (sternal prec) RUE Weight Bearing: Non weight bearing LUE Weight Bearing: Non weight bearing Other Position/Activity Restrictions: sternal precautions    Has the patient had 2 or more falls or a fall with injury in the past year?No   Prior Activity Level Community (5-7x/wk):  independent, driving, working fulltime   Prior Functional Level Prior Function Prior Level of Function : Independent/Modified Independent, Working/employed, Driving ADLs Comments: NT on CIR   Self Care: Did the patient need help bathing, dressing, using the toilet or eating?  Independent   Indoor Mobility: Did the patient need assistance with walking from room to room (with or without device)? Independent   Stairs: Did the patient need assistance with internal or external stairs (with or without device)? Independent   Functional Cognition: Did the patient need help planning regular tasks such as shopping or remembering to take medications? Independent   Patient Information Are you of Hispanic, Latino/a,or Spanish origin?: A. No, not of Hispanic, Latino/a, or Spanish origin What is your race? White Do you need or want an interpreter to communicate with a doctor or health care staff?: 0. No   Patient's Response To:  Health Literacy and Transportation Is the patient able to respond to health literacy and transportation needs?: Yes Health Literacy - How often do you need to have someone help you when you read instructions, pamphlets, or other written material from your doctor or pharmacy?: Never In the past 12 months, has lack of transportation kept you from medical appointments or from getting medications?: No In the past 12 months, has  lack of transportation kept you from meetings, work, or from getting things needed for daily living?: No   Journalist, newspaper / Equipment Home Equipment: None   Prior Device Use: Indicate devices/aids used by the patient prior to current illness, exacerbation or injury? None of the above   Current Functional Level Cognition   Overall Cognitive Status: Within Functional Limits for tasks assessed Orientation Level: Oriented X4 General Comments: pt able to recall precautions but requires verbal cues to adhere functionally, pt became emotional during  ambulation due to life altering event    Extremity Assessment (includes Sensation/Coordination)   Upper Extremity Assessment: Defer to OT evaluation  Lower Extremity Assessment: Generalized weakness     ADLs   Overall ADL's : Needs assistance/impaired Eating/Feeding: Modified independent, Sitting Grooming: Wash/dry face, Oral care, Brushing hair, Set up, Sitting Grooming Details (indicate cue type and reason): limited by sternal precautions and decreased endurance Upper Body Bathing: Minimal assistance, Cueing for compensatory techniques, Sitting, Cueing for safety Upper Body Bathing Details (indicate cue type and reason): limited by sternal precautions Lower Body Bathing: Maximal assistance, Sitting/lateral leans, Sit to/from stand, Cueing for compensatory techniques, Cueing for safety Lower Body Bathing Details (indicate cue type and reason): limited by sternal precautions Upper Body Dressing : Minimal assistance, Cueing for safety, Cueing for compensatory techniques, Cueing for UE precautions, Sitting Upper Body Dressing Details (indicate cue type and reason): limited by sternal precautions Lower Body Dressing: Total assistance Lower Body Dressing Details (indicate cue type and reason): limited by sternal precautions and pain Toilet Transfer: Ambulation, Rolling walker (2 wheels), Regular Toilet, Minimal assistance Toilet Transfer Details (indicate cue type and reason): limited by sternal precautions Toileting- Clothing Manipulation and Hygiene: Total assistance, Sit to/from stand, Cueing for safety, Cueing for compensatory techniques Toileting - Clothing Manipulation Details (indicate cue type and reason): limited by sternal precautions Functional mobility during ADLs: Contact guard assist, Rolling walker (2 wheels), Cueing for safety     Mobility   Overal bed mobility: Needs Assistance Bed Mobility: Rolling, Sidelying to Sit Rolling: Min assist Supine to sit: Mod assist Sit to  supine: Min assist General bed mobility comments: HOB elevated, pt iniated rolling to the L, asked for assist for trunk elevation     Transfers   Overall transfer level: Needs assistance Equipment used: Rolling walker (2 wheels) Transfers: Sit to/from Stand, Bed to chair/wheelchair/BSC Sit to Stand: Min assist Bed to/from chair/wheelchair/BSC transfer type:: Step pivot Step pivot transfers: Contact guard assist General transfer comment: VC for sternal precautions and education on compensatory technique of crossed arms over chest to maintain sternal precautions with and without use of pillow, minA to steady descent to low toliet surface height     Ambulation / Gait / Stairs / Wheelchair Mobility   Ambulation/Gait Ambulation/Gait assistance: Contact guard assist Gait Distance (Feet): 75 Feet Assistive device: Rollator (4 wheels) Gait Pattern/deviations: Step-through pattern, Decreased stride length General Gait Details: light use of rollator, pt with 3 standing rest breaks, c/o SOB due to pain with regular/deep breathing at incision site, SpO2 >94% on 4Lo2 via [place] Gait velocity: decreased Gait velocity interpretation: <1.31 ft/sec, indicative of household ambulator     Posture / Balance Dynamic Sitting Balance Sitting balance - Comments: sitting EOB Balance Overall balance assessment: Needs assistance Sitting-balance support: No upper extremity supported, Feet supported Sitting balance-Leahy Scale: Fair Sitting balance - Comments: sitting EOB Standing balance support: During functional activity, Reliant on assistive device for balance, Bilateral upper extremity supported Standing balance-Leahy Scale: Poor  Standing balance comment: Reliant on RW when walking in hallway     Special needs/care consideration Sternal precautions Smoker Hgb A1c 6.7    Previous Home Environment  Living Arrangements: Spouse/significant other  Lives With: Spouse Available Help at Discharge: Family,  Available 24 hours/day Type of Home: House Home Layout: One level Home Access: Stairs to enter Entrance Stairs-Rails: Left Entrance Stairs-Number of Steps: 6 Bathroom Shower/Tub: Associate Professor: Yes How Accessible: Accessible via walker Home Care Services: No Additional Comments: Has Cpap machine   Discharge Living Setting Plans for Discharge Living Setting: Patient's home, Lives with (comment) (spouse) Type of Home at Discharge: House Discharge Home Layout: One level Discharge Home Access: Stairs to enter Entrance Stairs-Rails: Left Entrance Stairs-Number of Steps: 6 Discharge Bathroom Shower/Tub: Tub/shower unit Discharge Bathroom Toilet: Standard Discharge Bathroom Accessibility: Yes How Accessible: Accessible via walker Does the patient have any problems obtaining your medications?: No   Social/Family/Support Systems Patient Roles: Spouse (employee) Contact Information: spouse, Maryjane Hurter and dtr, Desiree Anticipated Caregiver: spouse and daughter Anticipated Caregiver's Contact Information: see contacts Ability/Limitations of Caregiver: spouse can provide only superivsion, dtr works Engineer, structural Availability: 24/7 Discharge Plan Discussed with Primary Caregiver: Yes Is Caregiver In Agreement with Plan?: Yes Does Caregiver/Family have Issues with Lodging/Transportation while Pt is in Rehab?: No   Mom can assist with supervision level   Goals Patient/Family Goal for Rehab: Mod I to supervision with PT and OT Expected length of stay: ELOS 7 to 10 days Pt/Family Agrees to Admission and willing to participate: Yes Program Orientation Provided & Reviewed with Pt/Caregiver Including Roles  & Responsibilities: Yes   Decrease burden of Care through IP rehab admission: n/a   Possible need for SNF placement upon discharge:not anticipated   Patient Condition: This patient's condition remains as documented in the consult dated  08/23/2023, in which the Rehabilitation Physician determined and documented that the patient's condition is appropriate for intensive rehabilitative care in an inpatient rehabilitation facility. Will admit to inpatient rehab today.   Preadmission Screen Completed By:  Clois Dupes, RN MSN 08/23/2023 11:02 AM ______________________________________________________________________   Discussed status with Dr. Natale Lay on 08/24/23 at 1542 and received approval for admission today.   Admission Coordinator:  Clois Dupes RN MSN time [id number] Date 08/24/23             Revision History  Routing History

## 2023-08-24 NOTE — H&P (Incomplete)
Physical Medicine and Rehabilitation Admission H&P   CC: Functional deficits secondary to CAD  HPI: Renee Ho is a 47 year old female with CAD who underwent CABG time 4 vessels by dr. Dorris Fetch on 08/19/2023. She had previously undergone stent placement to LCX on 07/23/2023 after presenting to the ED with sub-sternal chest pain. LVEF was 60-65% with moderate symmetric hypertrophy of septal segment and grad I diastolic dysfunction. Discharged on DAPT and to consider CABG if failed medically. Follows with Dr. Odis Hollingshead. She continued to be symptomatic with shortness of breath and exercise intolerance. Referral make to cardiothoracic surgery; however, she presented to the ED on 08/14/2023 with chest pain. Lab work unremarkable. EKG and troponins normal. PMH includes active sleep apnea on CPAP, polycystic ovarian syndrome, diabetes mellitus, hyper cholesterolemia, hypertension, depression/anxiety. History of tobacco abuse recently quit.  She was admitted by [facility] and cardiology consulted.  She continued to have exertional chest pain and dyspnea.  CRP was negative and no pericarditis.  Her BNP was mildly elevated at 119.  Medical management was limited due to low normal blood pressure and heart rate as well as dizziness.  She underwent cardiothoracic evaluation on 11/05 and agreed to proceed with CABG for residual LAD and diagonal disease.  She was transitioned from Brilinta to cangrelor intravenously.  On 11/08, she underwent coronary artery bypass grafting x 4 with vein harvest from right thigh. Postoperatively she had low BP and could not tolerate Lasix for diuresis. Poor PO intake. Reportedly had had no bowel movement in 11 days. Episode of SVT 11/12 while toileting lasting about 20 minutes. Cardiology re-consulted on 11/12.  Unable to tolerate beta-blocker dosage due to low blood pressure.  She was started on amiodarone with plans to continue 200 mg twice daily for 1 week before going to maintenance  dose of 200 mg daily.  Further episodes of SVT.  Reported BM on 11/12.  Now on aspirin and Plavix. The patient requires inpatient medicine and rehabilitation evaluations and services for ongoing dysfunction secondary to CAD.  ROS Past Medical History:  Diagnosis Date   Depression    Diabetes mellitus    Diabetes mellitus without complication (HCC)    Diverticula of intestine    High cholesterol    Hypertension    OSA on CPAP    CPAP pressure= 15   Polycystic ovarian syndrome    Sleep apnea    on CPAP with pressure setting= 15   Past Surgical History:  Procedure Laterality Date   ABDOMINAL HYSTERECTOMY     CORONARY ARTERY BYPASS GRAFT N/A 08/19/2023   Procedure: CORONARY ARTERY BYPASS GRAFTING (CABG) X THREE, USING LEFT INTERNAL MAMMARY ARTERY AND RIGHT GREATER SAPHENEOUS VEIN HARVESTED ENDOSCOPICLY;  Surgeon: [name], MD;  Location: [facility] OR;  Service: Open Heart Surgery;  Laterality: N/A;   CORONARY STENT INTERVENTION N/A 07/23/2023   Procedure: CORONARY STENT INTERVENTION;  Surgeon: Kathleene Hazel, MD;  Location: [facility] INVASIVE CV LAB;  Service: Cardiovascular;  Laterality: N/A;   CYST REMOVAL NECK     KNEE ARTHROSCOPY  07/11/2012   Procedure: ARTHROSCOPY KNEE;  Surgeon: Cammy Copa, MD;  Location: [facility] OR;  Service: Orthopedics;  Laterality: Right;  Right knee arthroscopy with debridement   KNEE SURGERY     LEFT HEART CATH AND CORONARY ANGIOGRAPHY N/A 07/23/2023   Procedure: LEFT HEART CATH AND CORONARY ANGIOGRAPHY;  Surgeon: Kathleene Hazel, MD;  Location: [facility] INVASIVE CV LAB;  Service: Cardiovascular;  Laterality: N/A;   OVARIAN CYST REMOVAL  TEE WITHOUT CARDIOVERSION N/A 08/19/2023   Procedure: TRANSESOPHAGEAL ECHOCARDIOGRAM;  Surgeon: [name], MD;  Location: [facility] OR;  Service: Open Heart Surgery;  Laterality: N/A;   WISDOM TOOTH EXTRACTION  05/11/2017   Family History  Problem Relation Age of Onset   Cancer Mother        breast    Mental illness Mother    Depression Mother    Hyperlipidemia Mother    Breast cancer Mother    Healthy Father    Hyperlipidemia Father    Diabetes Maternal Grandmother    Hyperlipidemia Maternal Grandmother    Cancer Maternal Grandfather        unknown type   Depression Maternal Grandfather    Hyperlipidemia Maternal Grandfather    Stroke Paternal Grandmother    Hyperlipidemia Paternal Grandmother    Hyperlipidemia Paternal Grandfather    Sleep apnea Neg Hx    Social History:  reports that she has been smoking cigarettes. She has a 10 pack-year smoking history. She has never used smokeless tobacco. She reports that she does not drink alcohol and does not use drugs. Allergies:  Allergies  Allergen Reactions   Morphine And Codeine Other (See Comments)    Migraines   Reglan [Metoclopramide] Other (See Comments)    Psychotic episodes   Chantix [Varenicline] Other (See Comments)    Makes the patient angry feeling   Toradol [Ketorolac Tromethamine] Rash   Medications Prior to Admission  Medication Sig Dispense Refill   aspirin EC 81 MG tablet Take 1 tablet (81 mg total) by mouth daily. Swallow whole. 30 tablet 0   atorvastatin (LIPITOR) 80 MG tablet Take 1 tablet (80 mg total) by mouth daily. 90 tablet 3   dapagliflozin propanediol (FARXIGA) 10 MG TABS tablet Take 1 tablet (10 mg total) by mouth daily. 90 tablet 3   DULoxetine (CYMBALTA) 30 MG capsule Take 3 capsules (90 mg total) by mouth daily. 270 capsule 1   ezetimibe (ZETIA) 10 MG tablet Take 1 tablet (10 mg total) by mouth daily. 90 tablet 3   fenofibrate 160 MG tablet Take 1 tablet (160 mg total) by mouth daily. 90 tablet 3   hydrochlorothiazide (HYDRODIURIL) 25 MG tablet Take 1 tablet (25 mg total) by mouth daily. 90 tablet 2   insulin glargine-yfgn (SEMGLEE, YFGN,) 100 UNIT/ML Pen Inject 60 Units into the skin at bedtime. (Patient taking differently: Inject 100 Units into the skin at bedtime.)     Insulin Pen Needle 31G X 8  MM MISC Use as directed 100 each 3   isosorbide mononitrate (IMDUR) 30 MG 24 hr tablet Take 1 tablet (30 mg total) by mouth daily. 90 tablet 3   lisinopril (ZESTRIL) 5 MG tablet Take 1 tablet (5 mg total) by mouth daily. 90 tablet 3   metFORMIN (GLUCOPHAGE) 1000 MG tablet Take 1 tablet (1,000 mg total) by mouth 2 (two) times daily with a meal. 180 tablet 1   metoprolol tartrate (LOPRESSOR) 25 MG tablet Take 1 tablet (25 mg total) by mouth 2 (two) times daily. 180 tablet 3   ticagrelor (BRILINTA) 90 MG TABS tablet Take 1 tablet (90 mg total) by mouth 2 (two) times daily. (Patient taking differently: Take 90 mg by mouth 2 (two) times daily. Take on Sundays) 180 tablet 3   tirzepatide (MOUNJARO) 7.5 MG/0.5ML Pen Inject 7.5 mg into the skin once a week. 6 mL 1   PRESCRIPTION MEDICATION See admin instructions. CPAP- CONTINUOUS        Home: Home  Living Family/patient expects to be discharged to:: Private residence Living Arrangements: Spouse/significant other Available Help at Discharge: Family, Available 24 hours/day Type of Home: House Home Access: Stairs to enter [employer] of Steps: 6 Entrance Stairs-Rails: Left Home Layout: One level Bathroom Shower/Tub: Associate Professor: Yes Home Equipment: None Additional Comments: Has Cpap machine  Lives With: Spouse   Functional History: Prior Function Prior Level of Function : Independent/Modified Independent, Working/employed, Driving ADLs Comments: NT on CIR  Functional Status:  Mobility: Bed Mobility Overal bed mobility: Needs Assistance Bed Mobility: Supine to Sit, Sit to Supine, Sidelying to Sit Rolling: Min assist Sidelying to sit: HOB elevated, Min assist Supine to sit: Mod assist Sit to supine: Supervision, HOB elevated, Used rails General bed mobility comments: Able to roll to sidelying without physical assist. Due to pain level, pt required physical assist to bring  trunk up off HOB from elevated position. Pt return to bed from toilet by stepping her right up onto bed then slide hips over onto bed. Education provided regarding preferred technique of sitting first then log rolling to maintain sternal precautions and protect integrety of incision. Pt verbalized understanding and demonstrated recognition of what she did after the fact while verbalizing that she was told that during previous PT session today. Transfers Overall transfer level: Needs assistance Equipment used: Rollator (4 wheels) Transfers: Sit to/from Stand, Bed to chair/wheelchair/BSC Sit to Stand: Contact guard assist, From elevated surface Bed to/from chair/wheelchair/BSC transfer type:: Step pivot Step pivot transfers: Contact guard assist General transfer comment: VC for sternal precautions during functional transfer. Brakes on Rolator are not functioning and OT remained close to manage lines and stabilize Rolator. Ambulation/Gait Ambulation/Gait assistance: Contact guard assist Gait Distance (Feet): 75 Feet Assistive device: Rollator (4 wheels) Gait Pattern/deviations: Step-through pattern, Decreased stride length General Gait Details: light use of rollator, pt with 3 standing rest breaks, c/o SOB due to pain with regular/deep breathing at incision site, SpO2 >94% on 4Lo2 via [facility] Gait velocity: decreased Gait velocity interpretation: <1.31 ft/sec, indicative of household ambulator    ADL: ADL Overall ADL's : Needs assistance/impaired Eating/Feeding: Modified independent, Sitting Grooming: Wash/dry hands, Supervision/safety, Standing Grooming Details (indicate cue type and reason): limited by sternal precautions and decreased endurance Upper Body Bathing: Minimal assistance, Cueing for compensatory techniques, Sitting, Cueing for safety Upper Body Bathing Details (indicate cue type and reason): limited by sternal precautions Lower Body Bathing: Maximal assistance, Sitting/lateral  leans, Sit to/from stand, Cueing for compensatory techniques, Cueing for safety Lower Body Bathing Details (indicate cue type and reason): limited by sternal precautions Upper Body Dressing : Minimal assistance, Cueing for safety, Cueing for compensatory techniques, Cueing for UE precautions, Sitting Upper Body Dressing Details (indicate cue type and reason): limited by sternal precautions Lower Body Dressing: Total assistance Lower Body Dressing Details (indicate cue type and reason): limited by sternal precautions and pain Toilet Transfer: Contact guard assist, Ambulation, Regular Toilet, Rollator (4 wheels) Toilet Transfer Details (indicate cue type and reason): Decreased control noted during stand to sit transition although pt did remember to use arms crossed technique during sit<>stand transition. Pt was able to control sit to stand transition when finished. Toileting- Architect and Hygiene: Supervision/safety, Sit to/from stand Toileting - Clothing Manipulation Details (indicate cue type and reason): Wiped front to back to maintain sternal precautions. No clothing to manage Functional mobility during ADLs: Contact guard assist, Rolling walker (2 wheels), Cueing for safety  Cognition: Cognition Overall Cognitive Status: Impaired/Different from baseline  Orientation Level: Oriented X4 Cognition Arousal: Alert (Demonstrated pain throughout session closing eyes and focusing on breathing intermittently.) Behavior During Therapy: Flat affect (pain behavior) Overall Cognitive Status: Impaired/Different from baseline Area of Impairment: Memory, Safety/judgement Memory: Decreased short-term memory Safety/Judgement: Decreased awareness of safety General Comments: Pt required education regarding bed mobility and sternal precautions after she returned from bathroom to the bed. Pain level effecting judgement and memory recall. Difficulty deciphering left from right when provided verbal  instructions.  Physical Exam: Blood pressure (!) 109/55, pulse 75, temperature 99.3 F (37.4 C), temperature source Axillary, resp. rate 18, height 5\' 5"  (1.651 m), weight 118.3 kg, SpO2 100%. Physical Exam  Results for orders placed or performed during the hospital encounter of 08/14/23 (from the past 48 hour(s))  Glucose, capillary     Status: Abnormal   Collection Time: 08/22/23  4:17 PM  Result Value Ref Range   Glucose-Capillary 164 (H) 70 - 99 mg/dL    Comment: Glucose reference range applies only to samples taken after fasting for at least 8 hours.  Glucose, capillary     Status: Abnormal   Collection Time: 08/22/23  9:17 PM  Result Value Ref Range   Glucose-Capillary 164 (H) 70 - 99 mg/dL    Comment: Glucose reference range applies only to samples taken after fasting for at least 8 hours.   Comment 1 Notify RN    Comment 2 Document in Chart   Glucose, capillary     Status: Abnormal   Collection Time: 08/23/23  5:32 AM  Result Value Ref Range   Glucose-Capillary 142 (H) 70 - 99 mg/dL    Comment: Glucose reference range applies only to samples taken after fasting for at least 8 hours.   Comment 1 Notify RN    Comment 2 Document in Chart   Glucose, capillary     Status: Abnormal   Collection Time: 08/23/23 12:07 PM  Result Value Ref Range   Glucose-Capillary 143 (H) 70 - 99 mg/dL    Comment: Glucose reference range applies only to samples taken after fasting for at least 8 hours.  Glucose, capillary     Status: Abnormal   Collection Time: 08/23/23  4:57 PM  Result Value Ref Range   Glucose-Capillary 127 (H) 70 - 99 mg/dL    Comment: Glucose reference range applies only to samples taken after fasting for at least 8 hours.  Glucose, capillary     Status: Abnormal   Collection Time: 08/23/23  8:50 PM  Result Value Ref Range   Glucose-Capillary 194 (H) 70 - 99 mg/dL    Comment: Glucose reference range applies only to samples taken after fasting for at least 8 hours.    Comment 1 Notify RN    Comment 2 Document in Chart   Glucose, capillary     Status: Abnormal   Collection Time: 08/24/23  5:52 AM  Result Value Ref Range   Glucose-Capillary 133 (H) 70 - 99 mg/dL    Comment: Glucose reference range applies only to samples taken after fasting for at least 8 hours.   Comment 1 Notify RN    Comment 2 Document in Chart   Comprehensive metabolic panel     Status: Abnormal   Collection Time: 08/24/23  9:57 AM  Result Value Ref Range   Sodium 134 (L) 135 - 145 mmol/L   Potassium 3.6 3.5 - 5.1 mmol/L   Chloride 95 (L) 98 - 111 mmol/L   CO2 29 22 - 32 mmol/L  Glucose, Bld 143 (H) 70 - 99 mg/dL    Comment: Glucose reference range applies only to samples taken after fasting for at least 8 hours.   BUN 13 6 - 20 mg/dL   Creatinine, Ser 6.64 0.44 - 1.00 mg/dL   Calcium 8.3 (L) 8.9 - 10.3 mg/dL   Total Protein 6.0 (L) 6.5 - 8.1 g/dL   Albumin 2.9 (L) 3.5 - 5.0 g/dL   AST 21 15 - 41 U/L   ALT 9 0 - 44 U/L   Alkaline Phosphatase 31 (L) 38 - 126 U/L   Total Bilirubin 1.1 <1.2 mg/dL   GFR, Estimated >40 >34 mL/min    Comment: (NOTE) Calculated using the CKD-EPI Creatinine Equation (2021)    Anion gap 10 5 - 15    Comment: Performed at [facility], [address] [place], [place] [address]  TSH     Status: None   Collection Time: 08/24/23  9:57 AM  Result Value Ref Range   TSH 2.174 0.350 - 4.500 uIU/mL    Comment: Performed by a 3rd Generation assay with a functional sensitivity of <=0.01 uIU/mL. Performed at [facility], [address], [place], [place] [address]   Magnesium     Status: None   Collection Time: 08/24/23  9:57 AM  Result Value Ref Range   Magnesium 2.2 1.7 - 2.4 mg/dL    Comment: Performed at [facility], [address], [place], [place] [address]  Glucose, capillary     Status: Abnormal   Collection Time: 08/24/23 11:28 AM  Result Value Ref Range   Glucose-Capillary 158 (H) 70 - 99 mg/dL    Comment: Glucose  reference range applies only to samples taken after fasting for at least 8 hours.   No results found.    Blood pressure (!) 109/55, pulse 75, temperature 99.3 F (37.4 C), temperature source Axillary, resp. rate 18, height 5\' 5"  (1.651 m), weight 118.3 kg, SpO2 100%.  Medical Problem List and Plan: 1. Functional deficits secondary to ***  -patient may *** shower  -ELOS/Goals: ***  2.  Antithrombotics: -DVT/anticoagulation:  Pharmaceutical: Lovenox 60 mg q HS  -antiplatelet therapy: Aspirin and Plavix   3. Pain Management: Tylenol, oxycodone, tramadol as needed  4. Mood/Behavior/Sleep: LCSW to evaluate and provide emotional support  -continue Cymbalta 90 mg daily  -continue melatonin 3 mg q HS  -antipsychotic agents: n/a  5. Neuropsych/cognition: This patient is capable of making decisions on her own behalf.  6. Skin/Wound Care: Routine skin care checks   7. Fluids/Electrolytes/Nutrition: Routine Is and Os and follow-up chemistries  -continue Klor-con 20 mEq daily and follow-up BMP  8: Hypertension: monitor TID and prn  -Zestril, Lopressor held due to hypotension  -Lasix 40 mg daily resumed 11/13 (on Klor-con)  9: Hyperlipidemia: continue statin, Zetia, Vascepa  10: Episode of SVT: started amiodarone 400 mg BID on 11/12 for one week, then 200 mg BID for one week then 200 mg daily  -follow-up with Dr. Odis Hollingshead  11: DM-2: CBGs QID; A1c = 6.5% (at home on Farxiga, Orbisonia, metformin and Mounjaro)  -continue SSI   12: Thrush: continue Nystatin  13: OSA-on CPAP requiring supplemental O2  14: ABLA: follow-up CBC                                    ***  Milinda Antis, PA-C 08/24/2023

## 2023-08-24 NOTE — Progress Notes (Signed)
   Patient Name: Renee Ho Date of Encounter: 08/24/2023 [facility] Cardiologist: Tessa Lerner, DO   Interval Summary  .    Laying in bed, using Bipap. Sore, but progressing  Vital Signs .    Vitals:   08/24/23 0626 08/24/23 0752 08/24/23 1000 08/24/23 1125  BP:  (!) 85/67 (!) 109/58 (!) 109/55  Pulse:  70    Resp:  18  18  Temp:  99.1 F (37.3 C)  99.3 F (37.4 C)  TempSrc:  Axillary  Axillary  SpO2:  98%    Weight: 118.3 kg     Height:        Intake/Output Summary (Last 24 hours) at 08/24/2023 1223 Last data filed at 08/23/2023 2145 Gross per 24 hour  Intake 600 ml  Output --  Net 600 ml      08/24/2023    6:26 AM 08/23/2023    5:35 AM 08/22/2023    5:00 AM  Last 3 Weights  Weight (lbs) 260 lb 11.2 oz 264 lb 262 lb 9.1 oz  Weight (kg) 118.253 kg 119.75 kg 119.1 kg      Telemetry/ECG    Sinus Rhythm - Personally Reviewed  Physical Exam .   GEN: No acute distress.   Neck: No JVD Cardiac: RRR, no murmurs, rubs, or gallops.  Respiratory: Clear to auscultation bilaterally. GI: Soft, nontender, non-distended  MS: No edema  Assessment & Plan .     Unstable Angina Recent NSTEMI CAD s/p recent DES to Lcx (07/2023) S/p CABG -- Recently presented with NSTEMI and underwent PCI to circumflex.  Does have residual disease in the LAD with initial plan to treat medically as it was felt PCI of the proximal/mid LAD would potentially jail moderate to large diagonal branch.  She initially felt well after discharge. Presented back with recurrent angina -- underwent CABG x4 11/8 with Dr. Dorris Fetch (LIMA to LAD, SVG SEQUENTIALLY to DIAGONAL 2 and 1, SVG to PDA) -- working with CR, PT -- Brilinta was held pre op, has been DAPT with aspirin/Plavix  HTN -- Controlled, soft -- Has been ordered for metoprolol 12.5 mg twice daily but has not received doses in the past several days.  Will stop for now.   HLD -- Continue atorvastatin 80 mg daily,  Zetia, switched from fenofibrate to Vascepa   Diabetes -- Hemoglobin A1c 6.7 -- PTA meds-> Lantus, metformin, Farxiga and Mounjaro  SVT -- episode yesterday afternoon with HR in the 150s. EKG showed SVT 158bpm -- with marginal BPs, she was started on amiodarone 400mg  BID load x1 week, then decrease to 200mg  BID x1 week, then 200mg  daily. Has not received metoprolol for the past several days with low BPs -- no recurrent episodes    Per primary Depression Anxiety OSA Tobacco use- reports she has not smoked since discharge     For questions or updates, please contact [facility] Please consult [email or web address] for contact info under        Signed, Laverda Page, NP

## 2023-08-24 NOTE — Progress Notes (Signed)
Inpatient Rehabilitation Admissions Coordinator   I await insurance approval for possible [facility] admit.  Ottie Glazier, RN, MSN Rehab Admissions Coordinator [phone] 08/24/2023 9:03 AM

## 2023-08-25 ENCOUNTER — Inpatient Hospital Stay (HOSPITAL_COMMUNITY): Payer: 59

## 2023-08-25 DIAGNOSIS — R5381 Other malaise: Secondary | ICD-10-CM | POA: Diagnosis not present

## 2023-08-25 LAB — CBC WITH DIFFERENTIAL/PLATELET
Abs Immature Granulocytes: 0.16 10*3/uL — ABNORMAL HIGH (ref 0.00–0.07)
Basophils Absolute: 0 10*3/uL (ref 0.0–0.1)
Basophils Relative: 1 %
Eosinophils Absolute: 0.3 10*3/uL (ref 0.0–0.5)
Eosinophils Relative: 4 %
HCT: 29.6 % — ABNORMAL LOW (ref 36.0–46.0)
Hemoglobin: 9.8 g/dL — ABNORMAL LOW (ref 12.0–15.0)
Immature Granulocytes: 2 %
Lymphocytes Relative: 18 %
Lymphs Abs: 1.5 10*3/uL (ref 0.7–4.0)
MCH: 31 pg (ref 26.0–34.0)
MCHC: 33.1 g/dL (ref 30.0–36.0)
MCV: 93.7 fL (ref 80.0–100.0)
Monocytes Absolute: 0.7 10*3/uL (ref 0.1–1.0)
Monocytes Relative: 8 %
Neutro Abs: 5.6 10*3/uL (ref 1.7–7.7)
Neutrophils Relative %: 67 %
Platelets: 203 10*3/uL (ref 150–400)
RBC: 3.16 MIL/uL — ABNORMAL LOW (ref 3.87–5.11)
RDW: 13.5 % (ref 11.5–15.5)
WBC: 8.3 10*3/uL (ref 4.0–10.5)
nRBC: 0.2 % (ref 0.0–0.2)

## 2023-08-25 LAB — COMPREHENSIVE METABOLIC PANEL
ALT: 10 U/L (ref 0–44)
AST: 19 U/L (ref 15–41)
Albumin: 2.8 g/dL — ABNORMAL LOW (ref 3.5–5.0)
Alkaline Phosphatase: 36 U/L — ABNORMAL LOW (ref 38–126)
Anion gap: 7 (ref 5–15)
BUN: 11 mg/dL (ref 6–20)
CO2: 30 mmol/L (ref 22–32)
Calcium: 8.3 mg/dL — ABNORMAL LOW (ref 8.9–10.3)
Chloride: 97 mmol/L — ABNORMAL LOW (ref 98–111)
Creatinine, Ser: 0.9 mg/dL (ref 0.44–1.00)
GFR, Estimated: >60 mL/min (ref >60)
Glucose, Bld: 198 mg/dL — ABNORMAL HIGH (ref 70–99)
Potassium: 3.7 mmol/L (ref 3.5–5.1)
Sodium: 134 mmol/L — ABNORMAL LOW (ref 135–145)
Total Bilirubin: 0.9 mg/dL (ref <1.2)
Total Protein: 5.8 g/dL — ABNORMAL LOW (ref 6.5–8.1)

## 2023-08-25 LAB — GLUCOSE, CAPILLARY
Glucose-Capillary: 162 mg/dL — ABNORMAL HIGH (ref 70–99)
Glucose-Capillary: 180 mg/dL — ABNORMAL HIGH (ref 70–99)
Glucose-Capillary: 145 mg/dL — ABNORMAL HIGH (ref 70–99)
Glucose-Capillary: 130 mg/dL — ABNORMAL HIGH (ref 70–99)

## 2023-08-25 MED ORDER — METHOCARBAMOL 750 MG PO TABS
750.0000 mg | ORAL_TABLET | Freq: Three times a day (TID) | ORAL | Status: DC
  Administered 2023-08-25 – 2023-08-26 (×3): 750 mg via ORAL
  Filled 2023-08-25 (×3): qty 1

## 2023-08-25 MED ORDER — ACETAMINOPHEN 500 MG PO TABS
1000.0000 mg | ORAL_TABLET | Freq: Three times a day (TID) | ORAL | Status: DC
  Administered 2023-08-25 – 2023-08-31 (×18): 1000 mg via ORAL
  Filled 2023-08-25 (×18): qty 2

## 2023-08-25 MED ORDER — TRAZODONE HCL 50 MG PO TABS
50.0000 mg | ORAL_TABLET | Freq: Every evening | ORAL | Status: DC | PRN
  Administered 2023-08-26: 50 mg via ORAL
  Filled 2023-08-25: qty 1

## 2023-08-25 MED ORDER — ONDANSETRON HCL 4 MG PO TABS
4.0000 mg | ORAL_TABLET | Freq: Four times a day (QID) | ORAL | Status: DC | PRN
  Administered 2023-08-25: 4 mg via ORAL
  Filled 2023-08-25: qty 1

## 2023-08-25 MED ORDER — SENNOSIDES-DOCUSATE SODIUM 8.6-50 MG PO TABS
1.0000 | ORAL_TABLET | Freq: Two times a day (BID) | ORAL | Status: DC
  Administered 2023-08-25 – 2023-08-27 (×5): 1 via ORAL
  Filled 2023-08-25 (×5): qty 1

## 2023-08-25 MED ORDER — SIMETHICONE 80 MG PO CHEW
80.0000 mg | CHEWABLE_TABLET | Freq: Four times a day (QID) | ORAL | Status: DC
  Administered 2023-08-25 – 2023-08-30 (×18): 80 mg via ORAL
  Filled 2023-08-25 (×18): qty 1

## 2023-08-25 MED ORDER — AMIODARONE HCL 200 MG PO TABS: 200.0000 mg | ORAL_TABLET | Freq: Two times a day (BID) | ORAL | Status: DC

## 2023-08-25 MED ORDER — AMIODARONE HCL 200 MG PO TABS: 400.0000 mg | ORAL_TABLET | Freq: Two times a day (BID) | ORAL | Status: DC

## 2023-08-25 MED ORDER — AMIODARONE HCL 200 MG PO TABS
400.0000 mg | ORAL_TABLET | Freq: Two times a day (BID) | ORAL | Status: AC
  Administered 2023-08-25 – 2023-08-30 (×10): 400 mg via ORAL
  Filled 2023-08-25 (×10): qty 2

## 2023-08-25 MED ORDER — LIDOCAINE 5 % EX PTCH
1.0000 | MEDICATED_PATCH | CUTANEOUS | Status: DC
  Administered 2023-08-25 – 2023-08-31 (×7): 1 via TRANSDERMAL
  Filled 2023-08-25 (×8): qty 1

## 2023-08-25 MED ORDER — MELATONIN 5 MG PO TABS
5.0000 mg | ORAL_TABLET | Freq: Every day | ORAL | Status: DC
  Administered 2023-08-25: 5 mg via ORAL
  Filled 2023-08-25: qty 1

## 2023-08-25 MED ORDER — AMIODARONE HCL 200 MG PO TABS
200.0000 mg | ORAL_TABLET | Freq: Two times a day (BID) | ORAL | Status: DC
  Administered 2023-08-30 – 2023-08-31 (×2): 200 mg via ORAL
  Filled 2023-08-25 (×2): qty 1

## 2023-08-25 MED ORDER — LACTULOSE 10 GM/15ML PO SOLN
20.0000 g | Freq: Every day | ORAL | Status: DC | PRN
  Administered 2023-08-25: 20 g via ORAL
  Filled 2023-08-25: qty 30

## 2023-08-25 MED ORDER — ONDANSETRON HCL 4 MG PO TABS
4.0000 mg | ORAL_TABLET | Freq: Once | ORAL | Status: AC
  Administered 2023-08-25: 4 mg via ORAL

## 2023-08-25 MED ORDER — METFORMIN HCL 500 MG PO TABS
1000.0000 mg | ORAL_TABLET | Freq: Two times a day (BID) | ORAL | Status: DC
  Administered 2023-08-25 – 2023-08-26 (×2): 1000 mg via ORAL
  Filled 2023-08-25 (×2): qty 2

## 2023-08-25 MED ORDER — AMIODARONE HCL 200 MG PO TABS: 200.0000 mg | ORAL_TABLET | Freq: Every day | ORAL | Status: DC

## 2023-08-25 MED FILL — Heparin Sodium (Porcine) Inj 1000 Unit/ML: Qty: 1000 | Status: AC

## 2023-08-25 MED FILL — Potassium Chloride Inj 2 mEq/ML: INTRAVENOUS | Qty: 40 | Status: AC

## 2023-08-25 MED FILL — Magnesium Sulfate Inj 50%: INTRAMUSCULAR | Qty: 10 | Status: AC

## 2023-08-25 NOTE — Discharge Summary (Signed)
Physician Discharge Summary  Patient ID: Renee Ho MRN: [id number] DOB/AGE: 49-Jun-1977 49 y.o.  Admit date: 08/24/2023 Discharge date: 08/31/2023  Discharge Diagnoses:  Principal Problem:   Debility Active Problems:   Ileus (HCC) Active problems: Debility secondary to CAD Hypertension SVT Anemia Depression Hyperlipidemia DM-2 Oral thrush OSA Morbid obesity Left shoulder numbness  Discharged Condition: stable  Significant Diagnostic Studies:   Lower Venous DVT Study  Patient Name:  Renee Ho  Date of Exam:   08/29/2023 Medical Rec #: [id number]                   Accession #:    [id number] Date of Birth: 1976/01/16                    Patient Gender: F Patient Age:   47 years Exam Location:  [facility] Procedure:      VAS [place] LOWER EXTREMITY VENOUS (DVT) Referring Phys: Wendi Maya   --------------------------------------------------------------------------- -----   Indications: Swelling, Edema, and SOB.   Comparison Study: No prior exam.  Performing Technologist: Fernande Bras    Examination Guidelines: A complete evaluation includes B-mode imaging, spectral Doppler, color Doppler, and power Doppler as needed of all accessible portions of each vessel. Bilateral testing is considered an integral part of a complete examination. Limited examinations for reoccurring indications may be performed as noted. The reflux portion of the exam is performed with the patient in reverse Trendelenburg.     +---------+---------------+---------+-----------+----------+--------------+  RIGHT    CompressibilityPhasicitySpontaneityPropertiesThrombus Aging +---------+---------------+---------+-----------+----------+--------------+  CFV      Full           Yes      Yes                                  +---------+---------------+---------+-----------+----------+--------------+  SFJ      Full                                                          +---------+---------------+---------+-----------+----------+--------------+  FV Prox  Full                                                         +---------+---------------+---------+-----------+----------+--------------+  FV Mid   Full                                                         +---------+---------------+---------+-----------+----------+--------------+  FV DistalFull                                                         +---------+---------------+---------+-----------+----------+--------------+  PFV      Full                                                         +---------+---------------+---------+-----------+----------+--------------+  POP      Full           Yes      Yes                                  +---------+---------------+---------+-----------+----------+--------------+  PTV      Full                                                         +---------+---------------+---------+-----------+----------+--------------+  PERO     Full                                                         +---------+---------------+---------+-----------+----------+--------------+   Fluid collection noted at site of GSV harvest in upper thigh measuring: 7.4 x 1.1 x 3.3 cm and lower thigh measuring: 6.9 x 0.7 x 3.1 cm     +---------+---------------+---------+-----------+----------+--------------+  LEFT     CompressibilityPhasicitySpontaneityPropertiesThrombus Aging +---------+---------------+---------+-----------+----------+--------------+  CFV      Full           Yes      Yes                                  +---------+---------------+---------+-----------+----------+--------------+  SFJ      Full                                                         +---------+---------------+---------+-----------+----------+--------------+  FV Prox  Full                                                          +---------+---------------+---------+-----------+----------+--------------+  FV Mid   Full                                                         +---------+---------------+---------+-----------+----------+--------------+  FV DistalFull                                                         +---------+---------------+---------+-----------+----------+--------------+  PFV      Full                                                         +---------+---------------+---------+-----------+----------+--------------+  POP      Full           Yes      Yes                                  +---------+---------------+---------+-----------+----------+--------------+  PTV      Full                                                         +---------+---------------+---------+-----------+----------+--------------+  PERO     Full                                                         +---------+---------------+---------+-----------+----------+--------------+  Summary: BILATERAL: - No evidence of deep vein thrombosis seen in the lower extremities, bilaterally. -No evidence of popliteal cyst, bilaterally.   *See table(s) above for measurements and observations.  Electronically signed by Gerarda Fraction on 08/29/2023 at 4:25:55 PM.   Final     Narrative & Impression  CLINICAL DATA:  Ileus   EXAM: ABDOMEN - 1 VIEW   COMPARISON:  08/25/2023   FINDINGS: Similar gaseous distension of large and small bowel. No abnormally dilated loops of small bowel to suggest obstruction. No radio-opaque calculi or other significant radiographic abnormality are seen.   IMPRESSION: Similar gaseous distension of large and small bowel, most compatible with ileus.     Electronically Signed   By: Duanne Guess D.O.   On: 08/27/2023 11:34    Narrative & Impression  CLINICAL DATA:  Nausea vomiting    EXAM: ABDOMEN - 2 VIEW   COMPARISON:  CT 06/30/2023   FINDINGS: Lower sternotomy. Mild diffuse air distension of large and small bowel. Gas seen down to the pelvis. No radiopaque calculi   IMPRESSION: Mild diffuse air distension of large and small bowel, favor ileus.     Electronically Signed   By: Jasmine Pang M.D.   On: 08/25/2023 21:02    Labs:  Basic Metabolic Panel: Recent Labs  Lab 08/25/23 0815 08/26/23 1145 08/28/23 0459 08/29/23 0624 08/30/23 0637  NA 134* 135 138 136 139  K 3.7 3.9 3.6 4.0 3.9  CL 97* 96* 100 97* 99  CO2 30 27 30 31  33*  GLUCOSE 198* 138* 119* 142* 124*  BUN 11 11 10 13 14   CREATININE 0.90 0.98 1.04* 1.12* 1.13*  CALCIUM 8.3* 8.8* 8.3* 8.4* 8.2*    CBC: Recent Labs  Lab 08/25/23 0815 08/29/23 0624 08/30/23 0637  WBC 8.3 8.1 8.7  NEUTROABS 5.6  --   --   HGB 9.8* 8.8* 8.6*  HCT 29.6* 27.9* 27.4*  MCV 93.7 93.9 95.5  PLT 203 242 250    CBG: Recent Labs  Lab 08/30/23 1157 08/30/23 1628 08/30/23 1914 08/30/23 2050 08/31/23 0620  GLUCAP 105* 120* 138* 166* 100*    Brief HPI:   Renee Ho is a 47 y.o. female with CAD who underwent CABG time 4 vessels by dr. Dorris Fetch on 08/19/2023. She had previously undergone stent placement to LCX on 07/23/2023 after presenting to the ED with sub-sternal chest pain. LVEF  was 60-65% with moderate symmetric hypertrophy of septal segment and grad I diastolic dysfunction. Discharged on DAPT and to consider CABG if failed medically. Follows with Dr. Odis Hollingshead. She continued to be symptomatic with shortness of breath and exercise intolerance. Referral make to cardiothoracic surgery; however, she presented to the ED on 08/14/2023 with chest pain. Lab work unremarkable. EKG and troponins normal. PMH includes active sleep apnea on CPAP, polycystic ovarian syndrome, diabetes mellitus, hyper cholesterolemia, hypertension, depression/anxiety. History of tobacco abuse recently quit.  She was  admitted by [facility] and cardiology consulted.  She continued to have exertional chest pain and dyspnea.  CRP was negative and no pericarditis.  Her BNP was mildly elevated at 119.  Medical management was limited due to low normal blood pressure and heart rate as well as dizziness.  She underwent cardiothoracic evaluation on 11/05 and agreed to proceed with CABG for residual LAD and diagonal disease.  She was transitioned from Brilinta to cangrelor intravenously.  On 11/08, she underwent coronary artery bypass grafting x 4 with vein harvest from right thigh. Postoperatively she had low BP and could not tolerate Lasix for diuresis. Poor PO intake. Reportedly had had no bowel movement in 11 days. Episode of SVT 11/12 while toileting lasting about 20 minutes. Cardiology re-consulted on 11/12.  Unable to tolerate beta-blocker dosage due to low blood pressure.  She was started on amiodarone with plans to continue 200 mg twice daily for 1 week before going to maintenance dose of 200 mg daily.  Further episodes of SVT.  Reported BM on 11/12.  Now on aspirin and Plavix.  She reports pain and weakness in her left shoulder since her surgery.  She also has a burning sensation around her left shoulder.    Hospital Course: Renee Ho was admitted to rehab 08/24/2023 for inpatient therapies to consist of PT, ST and OT at least three hours five days a week. Past admission physiatrist, therapy team and rehab RN have worked together to provide customized collaborative inpatient rehab.  On admission, she continued with complaints of abdominal discomfort, bloating and nausea.  Placed on clear liquid diet.  KUB obtained.  Consulted hospitalist on 11/15.  Abdominal films repeated on 11/16.  Was administered smog enema with good results.  Abdominal complaints resolved. Sutures removed from chest tube sites with skin edge separation of right incision with oozing. Steri-stripped and foam border dressing applied.  Complained  of ongoing edema and pain in right thigh vein harvest site.  Lower extremity venous duplex performed without evidence of DVT.  Oral thrush resolved and nystatin discontinued.  Converted to oral Lasix 40 mg daily 11/18.  Discussed side effects of narcotics/oxycodone and advised to taper down over the next 2 weeks with dosing.  Encouraged to continue Robaxin twice daily and Tylenol as needed.  Neuropsychology consultation obtained 11/19.  Per cardiology note on 11/13: "switch from fenofibrate to Vascepa". Vascepa started and fenofibrate not discontinued. Discontinued fenofibrate at discharge home.  She will continue on Lipitor and Zetia.  Blood pressures were monitored on TID basis and her home Zestril and Lopressor were held due to hypotension. Lasix 40 mg daily resumed on 11/13.  Additional dose of Lasix 20 mg given due to lower extremity edema and weight gain.  Consulted cardiology and started on IV Lasix.  Volume management discussed with Dr. Tessa Lerner.  Patient discharged on Lasix 40 mg daily and given additional 20 mg tablet to use as needed for weight over 1 pound over 24 hours  and 5 pounds over 7 days.  Continue TED hose/Ace compression to right lower extremity.   Diabetes has been monitored with ac/hs CBG checks and SSI was use prn for tighter BS control.  Home metformin 1000 mg twice daily resumed on 11/14.  Reasonable control.  Creatinine stabilized and resumed Farxiga 10 mg at discharge.  Resume home Mounjaro as directed. Glucometer kit ordered at discharge.   Rehab course: During patient's stay in rehab weekly team conferences were held to monitor patient's progress, set goals and discuss barriers to discharge. At admission, patient required an assist with mobility and basic self-care skills.  She has had improvement in activity tolerance, balance, postural control as well as ability to compensate for deficits. She has had improvement in functional use RUE/LUE  and RLE/LLE as well as  improvement in awareness Patient has met 7 of 7 long term goals due to improved activity tolerance, improved balance, postural control, and ability to compensate for deficits.  Patient to discharge at overall Modified Independent level.  Patient's care partner is independent to provide the necessary physical assistance at discharge for higher level iADL tasks.  Encouraged to follow-up with cardiology regarding cardiac rehab.   Discharge disposition: 01-Home or Self Care     Diet: heart healthy, carb modified  Special Instructions: No driving, alcohol consumption or tobacco use.  Discussed with Dr. Tessa Lerner - Discharge regimen lasix 40 qday and 20mg  prn for weight >1# over 24hr or 5# over 7 days + TED hose/ACE compression to RLE  Discharge Instructions     Ambulatory referral to Physical Medicine Rehab   Complete by: As directed    Discharge patient   Complete by: As directed    Discharge disposition: 01-Home or Self Care   Discharge patient date: 08/31/2023      Allergies as of 08/31/2023       Reactions   Morphine And Codeine Other (See Comments)   Migraines   Reglan [metoclopramide] Other (See Comments)   Psychotic episodes   Chantix [varenicline] Other (See Comments)   Makes the patient angry feeling   Toradol [ketorolac Tromethamine] Rash        Medication List     STOP taking these medications    fenofibrate 160 MG tablet   hydrochlorothiazide 25 MG tablet Commonly known as: HYDRODIURIL   insulin glargine-yfgn 100 UNIT/ML Pen Commonly known as: Semglee (yfgn)   isosorbide mononitrate 30 MG 24 hr tablet Commonly known as: IMDUR   lisinopril 5 MG tablet Commonly known as: ZESTRIL   metoprolol tartrate 25 MG tablet Commonly known as: LOPRESSOR   PRESCRIPTION MEDICATION   TechLite Pen Needles 31G X 8 MM Misc Generic drug: Insulin Pen Needle   ticagrelor 90 MG Tabs tablet Commonly known as: BRILINTA       TAKE these medications     acetaminophen 325 MG tablet Commonly known as: Tylenol Take 1-2 tablets (325-650 mg total) by mouth every 4 (four) hours as needed.   amiodarone 200 MG tablet Commonly known as: PACERONE Take one tablet twice daily through 09/06/2023, then take one tablet daily. What changed: additional instructions   aspirin EC 81 MG tablet Take 1 tablet (81 mg total) by mouth daily. Swallow whole.   atorvastatin 80 MG tablet Commonly known as: LIPITOR Take 1 tablet (80 mg total) by mouth daily.   clopidogrel 75 MG tablet Commonly known as: PLAVIX Take 1 tablet (75 mg total) by mouth daily.   DULoxetine 30 MG capsule Commonly known as:  CYMBALTA Take 3 capsules (90 mg total) by mouth daily.   ezetimibe 10 MG tablet Commonly known as: ZETIA Take 1 tablet (10 mg total) by mouth daily.   Farxiga 10 MG Tabs tablet Generic drug: dapagliflozin propanediol Take 1 tablet (10 mg total) by mouth daily.   furosemide 20 MG tablet Commonly known as: Lasix Take 1 tablet (20 mg total) by mouth as needed for weight gain over 1 pound over 24 hours or for 5 pounds over 7 days What changed: You were already taking a medication with the same name, and this prescription was added. Make sure you understand how and when to take each.   furosemide 40 MG tablet Commonly known as: LASIX Take 1 tablet (40 mg total) by mouth daily. What changed: Another medication with the same name was added. Make sure you understand how and when to take each.   lidocaine 5 % Commonly known as: LIDODERM Place 1 patch onto the skin daily. Remove & Discard patch within 12 hours or as directed by MD   metFORMIN 1000 MG tablet Commonly known as: GLUCOPHAGE Take 1 tablet (1,000 mg total) by mouth 2 (two) times daily with a meal.   methocarbamol 750 MG tablet Commonly known as: ROBAXIN Take 1 tablet (750 mg total) by mouth 3 (three) times daily.   OneTouch Delica Plus Lancet33G Misc Use as directed.   OneTouch Verio Flex  System w/Device Kit Use up to four times daily as directed.   OneTouch Verio test strip Generic drug: glucose blood Use as directed.   glucose blood test strip Use as directed.   oxyCODONE 5 MG immediate release tablet Commonly known as: Oxy IR/ROXICODONE Take 1 tablet (5 mg total) by mouth every 6 (six) hours as needed for severe pain (pain score 7-10).   potassium chloride SA 20 MEQ tablet Commonly known as: KLOR-CON M Take 1 tablet (20 mEq total) by mouth 2 (two) times daily. What changed: when to take this   tirzepatide 7.5 MG/0.5ML Pen Commonly known as: MOUNJARO Inject 7.5 mg into the skin once a week. Start taking on: September 12, 2023   traZODone 150 MG tablet Commonly known as: DESYREL Take 0.5 tablets (75 mg total) by mouth at bedtime as needed for sleep.   Vascepa 1 g capsule Generic drug: icosapent Ethyl Take 2 capsules (2 g total) by mouth 2 (two) times daily.        Follow-up Information     Angelina Sheriff, DO Follow up.   Specialty: Physical Medicine and Rehabilitation Why: office will call you to arrange your appt (sent) Contact information: [address] [place] [address] [phone]         Saralyn Pilar A, PA Follow up.   Specialty: Family Medicine Why: Call in 1-2 days to make arrangements for hospital follow-up appointment. Ensure follow-up labs for creatinine and potassium level obtained. Contact information: [address] [place] [address] [phone]         Tessa Lerner, DO. Go to.   Specialties: Cardiology, Vascular Surgery Contact information: [address] [place] [address] [phone]         [name], MD. Go to.   Specialty: Cardiothoracic Surgery Contact information: [address] [place] [address] [phone]                 Signed: Milinda Antis 08/31/2023, 2:40 PM

## 2023-08-25 NOTE — Progress Notes (Signed)
Remains nauseated after Compazine. Will give one time dose of Zofran and check flat and upright abd films.

## 2023-08-25 NOTE — Progress Notes (Signed)
Occupational Therapy Session Note  Patient Details  Name: Renee Ho MRN: [id number] Date of Birth: 11/27/1975  Today's Date: 08/25/2023 OT Individual Time: 7829-5621 OT Individual Time Calculation (min): 15 min  and Today's Date: 08/25/2023 OT Missed Time: 45 Minutes Missed Time Reason: Pain   Short Term Goals: Week 1:  OT Short Term Goal 1 (Week 1): LTG=STG 2.2 ELOS  Skilled Therapeutic Interventions/Progress Updates:  Pt greeted seated in wc reporting nausea and abdominal discomfort. Pain 8/10. Nursing administered nausea medication, but pt declined to participate in therapy requesting to return to bed due to pain and nausea. OT assisted pt with CGA stand-pivot back to bed. Pt left sitting upright in bed with bed alarm on, call bell in reach, and needs met.   Balance/vestibular training;Cognitive remediation/compensation;Community reintegration;Discharge planning;Disease mangement/prevention;DME/adaptive equipment instruction;Functional electrical stimulation;Functional mobility training;Neuromuscular re-education;Pain management;Patient/family education;Psychosocial support;Self Care/advanced ADL retraining;Skin care/wound managment;Splinting/orthotics;Therapeutic Activities;Therapeutic Exercise;UE/LE Strength taining/ROM;UE/LE Coordination activities;Visual/perceptual remediation/compensation;Wheelchair propulsion/positioning   Therapy Documentation Precautions:  Precautions Precautions: Sternal Precaution Booklet Issued: Yes (comment) Restrictions Weight Bearing Restrictions: No Other Position/Activity Restrictions: sternal precautions General: General OT Amount of Missed Time: 45 Minutes Vital Signs: Therapy Vitals Temp: 98.3 F (36.8 C) Pulse Rate: 82 Resp: 20 BP: 119/62 Patient Position (if appropriate): Sitting Oxygen Therapy SpO2: 93 % O2 Device: Room Air Pain: Pain Assessment Pain Score: 8  Abdominal and chest pain    Therapy/Group:  Individual Therapy  Renee Ho 08/25/2023, 2:31 PM

## 2023-08-25 NOTE — Progress Notes (Signed)
Inpatient Rehabilitation Admission Medication Review by a Pharmacist  A complete drug regimen review was completed for this patient to identify any potential clinically significant medication issues.  High Risk Drug Classes Is patient taking? Indication by Medication  Antipsychotic Yes Compazine: nausea/vomiting  Anticoagulant Yes Lovenox: VTE ppx  Antibiotic No   Opioid Yes Oxycodone, tramadol: pain  Antiplatelet Yes Aspirin, Plavix: CAD s/p recent DES to Lcx (07/2023) S/p CABG  Hypoglycemics/insulin Yes SSI: hyperglycemia, DM  Vasoactive Medication Yes Amiodarone, Lasix: SVT, HTN  Chemotherapy No   Other Yes Senokot, Miralax, Bisacodyl, Lactulose: constipation Tylenol, Lidoderm patch: pain  Robaxin: muscle spasms Trazodone, Melatonin: sleep Lipitor, Zetia, Vascepa: HLD  Duloxetine: depression/anxiety Nystatin: Thrush Protonix: GERD KCl: supplement Mylanta: indigestion Robitussin: cough     Type of Medication Issue Identified Description of Issue Recommendation(s)  Drug Interaction(s) (clinically significant)     Duplicate Therapy     Allergy     No Medication Administration End Date     Incorrect Dose     Additional Drug Therapy Needed     Significant med changes from prior encounter (inform family/care partners about these prior to discharge). PTA meds not currently getting: Farxiga, metformin, Mounjaro Restart or discontinue as appropriate. Communicate medication changes with patient/family at discharge  Other       Clinically significant medication issues were identified that warrant physician communication and completion of prescribed/recommended actions by midnight of the next day:  No   Time spent performing this drug regimen review (minutes): 30   Thank you for allowing pharmacy to be a part of this patient's care.   Signe Colt, PharmD 08/25/2023 10:22 AM  **Pharmacist phone directory can be found on [email or web address] listed under [address] [facility]**

## 2023-08-25 NOTE — Progress Notes (Signed)
Ongoing nausea this afternoon with complaints of abdominal pain due to distention.  She is passing flatus but has not had a bowel movement today.  She has been dosed with Senokot, MiraLAX and lactulose.  Pain films of the abdomen obtained today.  2 view ordered minimally KUB performed.  Reading is pending.  To my review she has dilated small bowel loops and no gas in the rectum.  Will place her on clear liquid diet tonight and schedule simethicone 4 times daily.

## 2023-08-25 NOTE — Plan of Care (Signed)
A/Ox4 and on room air. Prn oxy and robaxin given x2 this shift. Slept with cpap on. Up with 1 assist. Sternal wound painted with betadine.    Problem: Consults Goal: RH GENERAL PATIENT EDUCATION Description: See Patient Education module for education specifics. Outcome: Progressing Goal: Skin Care Protocol Initiated - if Braden Score 18 or less Description: If consults are not indicated, leave blank or document N/A Outcome: Progressing Goal: Nutrition Consult-if indicated Outcome: Progressing Goal: Diabetes Guidelines if Diabetic/Glucose > 140 Description: If diabetic or lab glucose is > 140 mg/dl - Initiate Diabetes/Hyperglycemia Guidelines & Document Interventions  Outcome: Progressing   Problem: RH BOWEL ELIMINATION Goal: RH STG MANAGE BOWEL WITH ASSISTANCE Description: STG Manage Bowel with Assistance. Outcome: Progressing Goal: RH STG MANAGE BOWEL W/MEDICATION W/ASSISTANCE Description: STG Manage Bowel with Medication with Assistance. Outcome: Progressing   Problem: RH BLADDER ELIMINATION Goal: RH STG MANAGE BLADDER WITH ASSISTANCE Description: STG Manage Bladder With Assistance Outcome: Progressing Goal: RH STG MANAGE BLADDER WITH MEDICATION WITH ASSISTANCE Description: STG Manage Bladder With Medication With Assistance. Outcome: Progressing Goal: RH STG MANAGE BLADDER WITH EQUIPMENT WITH ASSISTANCE Description: STG Manage Bladder With Equipment With Assistance Outcome: Progressing   Problem: RH SKIN INTEGRITY Goal: RH STG SKIN FREE OF INFECTION/BREAKDOWN Outcome: Progressing Goal: RH STG MAINTAIN SKIN INTEGRITY WITH ASSISTANCE Description: STG Maintain Skin Integrity With Assistance. Outcome: Progressing Goal: RH STG ABLE TO PERFORM INCISION/WOUND CARE W/ASSISTANCE Description: STG Able To Perform Incision/Wound Care With Assistance. Outcome: Progressing   Problem: RH SAFETY Goal: RH STG ADHERE TO SAFETY PRECAUTIONS W/ASSISTANCE/DEVICE Description: STG Adhere to  Safety Precautions With Assistance/Device. Outcome: Progressing Goal: RH STG DECREASED RISK OF FALL WITH ASSISTANCE Description: STG Decreased Risk of Fall With Assistance. Outcome: Progressing   Problem: RH PAIN MANAGEMENT Goal: RH STG PAIN MANAGED AT OR BELOW PT'S PAIN GOAL Outcome: Progressing   Problem: RH KNOWLEDGE DEFICIT GENERAL Goal: RH STG INCREASE KNOWLEDGE OF SELF CARE AFTER HOSPITALIZATION Outcome: Progressing   Problem: RH Vision Goal: RH LTG Vision (Specify) Outcome: Progressing   Problem: RH Pre-functional/Other (Specify) Goal: RH LTG Pre-functional (Specify) Outcome: Progressing Goal: RH LTG Interdisciplinary (Specify) 1 Description: RH LTG Interdisciplinary (Specify)1 Outcome: Progressing Goal: RH LTG Interdisciplinary (Specify) 2 Description: RH LTG Interdisciplinary (Specify) 2  Outcome: Progressing

## 2023-08-25 NOTE — Progress Notes (Signed)
Met with patient, very tearful, and self reports depression. Neuro-psych to see.  Provided chakra from home.  Daughter delivered box of crystals for patient.  Reports that still has not had BM.  Asked nurse to provide lactulose. Discussed binder and new medications.  Will place sternal precautions above bed.  Discussed precautions with patient and placing weight to upper extremities when sitting on side of the bed.  Patient did stand at side of bed using precautions.  Asked nurse to place bed alarm on when patient back to bed. SW and nurse in room. Ate half of her sandwich and fruit for lunch.  All needs met, call bell in reach.

## 2023-08-25 NOTE — Progress Notes (Signed)
PROGRESS NOTE   Subjective/Complaints:  Patient endorses significant pain in her chest overnight.  States it is mostly in the upper part of her incision, feels deep, but does have some sensitivity to touch as well.  Have been better controlled when wound VAC was in place. Notes difficulty sleeping overnight, wondering about a dose increase of melatonin. Noting increased swelling in her bilateral legs Ongoing nausea, noting no vomiting but not much of an appetite, had 1 bowel movement on inpatient with magnesium citrate but prior to that had no bowel movement for 15 days.  Vitals stable, HR mildly bradycardic 59.  Labs with minimal hyponatremia, otherwise stable. Last BM 11/12, large.    ROS: + Chest pain, + difficulty sleeping, + nausea / poor appetite, +constipation. Denies fevers, chills, N/V, abdominal pain, constipation, diarrhea, SOB, cough, chest pain, new weakness or paraesthesias.    Objective:   No results found. Recent Labs    08/25/23 0815  WBC 8.3  HGB 9.8*  HCT 29.6*  PLT 203   Recent Labs    08/24/23 0957  NA 134*  K 3.6  CL 95*  CO2 29  GLUCOSE 143*  BUN 13  CREATININE 0.89  CALCIUM 8.3*    Intake/Output Summary (Last 24 hours) at 08/25/2023 0919 Last data filed at 08/25/2023 0800 Gross per 24 hour  Intake 240 ml  Output --  Net 240 ml        Physical Exam: Vital Signs Blood pressure 125/62, pulse (!) 59, temperature 97.9 F (36.6 C), resp. rate 17, height 5\' 5"  (1.651 m), weight 118.8 kg, SpO2 94%.  General: No apparent distress.  Sitting upright in wheelchair. +obese HEENT: Head is normocephalic, atraumatic.  No JVD.  No notable plaques on tongue. Heart: Reg rate and rhythm. No murmurs rubs or gallops.  Peripheral pulses 2+. + Bilateral edema at the calves and thighs, nonpitting Chest: CTA bilaterally without wheezes, rales, or rhonchi; no distress.  On room air. Abdomen: Soft,  nontender, mildly-distended, bowel sounds positive.   Psych: Appropriate mood and affect Skin:  Sternal incision midline CDI, well-approximated with mild erythema around borders right leg donor site site without signs of infection.  Open to air. Abdominal incisions with sutures remaining, CDI         Neuro: Alert and oriented x 4, apparent deficits Strength tested limited by sternal precautions in upper extremitie; antigravity and against resistance bilaterally Bilateral lower extremity strength 4+ out of 5 Decreased sensation over lateral left shoulder Altered sensation digits 2 through 5 on the left hand and digits 2 through 4 on the right hand Musculoskeletal: Right lower extremity and chest wall tenderness present + Decreased range of motion left shoulder, less than 90 degrees abduction and flexion  Assessment/Plan: 1. Functional deficits which require 3+ hours per day of interdisciplinary therapy in a comprehensive inpatient rehab setting. Physiatrist is providing close team supervision and 24 hour management of active medical problems listed below. Physiatrist and rehab team continue to assess barriers to discharge/monitor patient progress toward functional and medical goals  Care Tool:  Bathing              Bathing assist  Upper Body Dressing/Undressing Upper body dressing        Upper body assist      Lower Body Dressing/Undressing Lower body dressing            Lower body assist       Toileting Toileting    Toileting assist       Transfers Chair/bed transfer  Transfers assist           Locomotion Ambulation   Ambulation assist              Walk 10 feet activity   Assist           Walk 50 feet activity   Assist           Walk 150 feet activity   Assist           Walk 10 feet on uneven surface  activity   Assist           Wheelchair     Assist               Wheelchair 50  feet with 2 turns activity    Assist            Wheelchair 150 feet activity     Assist          Blood pressure 125/62, pulse (!) 59, temperature 97.9 F (36.6 C), resp. rate 17, height 5\' 5"  (1.651 m), weight 118.8 kg, SpO2 94%.  Medical Problem List and Plan: 1. 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 sternal precautions             -patient may shower, cover incisions please             -ELOS/Goals: 7 to 10 days, mod I to supervision with PT and OT             -Admit to CIR   - TED hose for LE edema   2.  Antithrombotics: -DVT/anticoagulation:  Pharmaceutical: Lovenox 60 mg q HS             -antiplatelet therapy: Aspirin and Plavix    3. Pain Management: Tylenol, oxycodone, tramadol as needed   - Add Tylenol 1000 mg scheduled, robaxin 750 mg TID   - Add lido patch over upper sternum for localized pain, as incision currently well approximated without drainage   4. Mood/Behavior/Sleep: LCSW to evaluate and provide emotional support             -continue Cymbalta 90 mg daily             -continue melatonin 3 mg q HS --> increase to 5 mg scheduled 2000, and add trazodone 50 mg PRN             -antipsychotic agents: n/a   5. Neuropsych/cognition: This patient is capable of making decisions on her own behalf.   6. Skin/Wound Care: Routine skin care checks   - Sternal incision healing well   - R groin vein harvesting site edematous but closed, without drainage   - Bilateral drain sutures intact - remove by 11/21 (2 weeks)   7. Fluids/Electrolytes/Nutrition: Routine Is and Os and follow-up chemistries             -continue Klor-con 20 mEq daily and follow-up BMP   8: Hypertension/CHF: monitor TID and prn             -Zestril, Lopressor held due to hypotension             -  Lasix 40 mg daily resumed 11/13 (on Klor-con)             -BP well-controlled, weights stable   - 11/14: Add TED hose for edema      08/25/2023     5:00 AM 08/25/2023    4:24 AM 08/24/2023    7:25 PM  Vitals with BMI  Weight 262 lbs    BMI 43.6    Systolic  125 106  Diastolic  62 61  Pulse  59 80   Filed Weights   08/24/23 1731 08/24/23 1825 08/25/23 0500  Weight: 117.9 kg 117.9 kg 118.8 kg    9: Hyperlipidemia: continue statin, Zetia, Vascepa   10: Episode of SVT: started amiodarone 400 mg BID on 11/12 for one week, then 200 mg BID for one week then 200 mg daily             -follow-up with Dr. Odis Hollingshead    - 11-14: Minimal bradycardia overnight, asymptomatic, monitor  11: DM-2: CBGs QID; A1c = 6.5% (at home on Farxiga, Liscomb, metformin and Mounjaro)             -continue SSI  -11-14 blood sugar slightly elevated, creatinine within normal limits, resume home metformin 1000 mg twice daily.   Recent Labs    08/24/23 2051 08/25/23 0554 08/25/23 1118  GLUCAP 172* 162* 180*     12: Thrush: continue Nystatin  -Not noted on exam 11-14   13: OSA-on CPAP requiring supplemental O2  -On room air 11-14   - ISB Q4H for pulmonary recovery   14: ABLA with chest hematoma: follow-up CBC.  Hemoglobin 9.6 on 11/11   -9.8 on admission labs, monitor q. Weekly  15: Morbid Obesity BMI 43             -Dietary counseling   16: Left shoulder numbness and weakness.  Suspect she may have had axillary nerve injury.  Continue to monitor for now   17. Constipation - LBM 11/12 with mag citrate   - DC colace, add sennakot-S 70m tab BID to miralax BID; keep docusate   - Add PRN lactulose 20 gm (wish to stay away from magnesium containing laxatives given cardiac issues)   LOS: 1 days A FACE TO FACE EVALUATION WAS PERFORMED  Angelina Sheriff 08/25/2023, 9:19 AM

## 2023-08-25 NOTE — Plan of Care (Signed)
  Problem: RH Bed Mobility Goal: LTG Patient will perform bed mobility with assist (PT) Description: LTG: Patient will perform bed mobility with assistance, with/without cues (PT). Flowsheets (Taken 08/25/2023 1220) LTG: Pt will perform bed mobility with assistance level of: Independent   Problem: RH Bed to Chair Transfers Goal: LTG Patient will perform bed/chair transfers w/assist (PT) Description: LTG: Patient will perform bed to chair transfers with assistance (PT). Flowsheets (Taken 08/25/2023 1220) LTG: Pt will perform Bed to Chair Transfers with assistance level: Independent   Problem: RH Car Transfers Goal: LTG Patient will perform car transfers with assist (PT) Description: LTG: Patient will perform car transfers with assistance (PT). Flowsheets (Taken 08/25/2023 1220) LTG: Pt will perform car transfers with assist:: Independent   Problem: RH Ambulation Goal: LTG Patient will ambulate in controlled environment (PT) Description: LTG: Patient will ambulate in a controlled environment, # of feet with assistance (PT). Flowsheets (Taken 08/25/2023 1220) LTG: Pt will ambulate in controlled environ  assist needed:: Independent LTG: Ambulation distance in controlled environment: 150' Goal: LTG Patient will ambulate in home environment (PT) Description: LTG: Patient will ambulate in home environment, # of feet with assistance (PT). Flowsheets (Taken 08/25/2023 1220) LTG: Pt will ambulate in home environ  assist needed:: Independent LTG: Ambulation distance in home environment: 50   Problem: RH Stairs Goal: LTG Patient will ambulate up and down stairs w/assist (PT) Description: LTG: Patient will ambulate up and down # of stairs with assistance (PT) Flowsheets (Taken 08/25/2023 1220) LTG: Pt will ambulate up/down stairs assist needed:: Independent with assistive device LTG: Pt will  ambulate up and down number of stairs: 6

## 2023-08-25 NOTE — Progress Notes (Signed)
Occupational Therapy Assessment and Plan  Patient Details  Name: Renee Ho MRN: [id number] Date of Birth: 07-11-1976  OT Diagnosis: acute pain, muscle weakness (generalized), and decreased cardiorespiratory endurance Rehab Potential: Rehab Potential (ACUTE ONLY): Excellent ELOS: 7-10 days   Today's Date: 08/25/2023 OT Individual Time: 0850-1000 OT Individual Time Calculation (min): 70 min     Hospital Problem: Principal Problem:   Debility   Past Medical History:  Past Medical History:  Diagnosis Date   Depression    Diabetes mellitus    Diabetes mellitus without complication (HCC)    Diverticula of intestine    High cholesterol    Hypertension    OSA on CPAP    CPAP pressure= 15   Polycystic ovarian syndrome    Sleep apnea    on CPAP with pressure setting= 15   Past Surgical History:  Past Surgical History:  Procedure Laterality Date   ABDOMINAL HYSTERECTOMY     CORONARY ARTERY BYPASS GRAFT N/A 08/19/2023   Procedure: CORONARY ARTERY BYPASS GRAFTING (CABG) X THREE, USING LEFT INTERNAL MAMMARY ARTERY AND RIGHT GREATER SAPHENEOUS VEIN HARVESTED ENDOSCOPICLY;  Surgeon: [name], MD;  Location: [facility] OR;  Service: Open Heart Surgery;  Laterality: N/A;   CORONARY STENT INTERVENTION N/A 07/23/2023   Procedure: CORONARY STENT INTERVENTION;  Surgeon: Kathleene Hazel, MD;  Location: [facility] INVASIVE CV LAB;  Service: Cardiovascular;  Laterality: N/A;   CYST REMOVAL NECK     KNEE ARTHROSCOPY  07/11/2012   Procedure: ARTHROSCOPY KNEE;  Surgeon: Cammy Copa, MD;  Location: [facility] OR;  Service: Orthopedics;  Laterality: Right;  Right knee arthroscopy with debridement   KNEE SURGERY     LEFT HEART CATH AND CORONARY ANGIOGRAPHY N/A 07/23/2023   Procedure: LEFT HEART CATH AND CORONARY ANGIOGRAPHY;  Surgeon: Kathleene Hazel, MD;  Location: [facility] INVASIVE CV LAB;  Service: Cardiovascular;  Laterality: N/A;   OVARIAN CYST REMOVAL     TEE WITHOUT  CARDIOVERSION N/A 08/19/2023   Procedure: TRANSESOPHAGEAL ECHOCARDIOGRAM;  Surgeon: [name], MD;  Location: [facility] OR;  Service: Open Heart Surgery;  Laterality: N/A;   WISDOM TOOTH EXTRACTION  05/11/2017    Assessment & Plan Clinical Impression: Patient is a 47 y.o. year old female with recent admission to the hospital on s/p CABG 11/8. hx of CAD s/p DES to Lcx (07/23/23), HLD, TIIDM, tobacco abuse, sleep apnea, HTN, anxiety/depression with recent admission for NSTEMI who presented with SOB, exercise intolerance and chest pain. Patient transferred to CIR on 08/24/2023 .    Patient currently requires min with basic self-care skills secondary to muscle weakness, decreased cardiorespiratoy endurance and decreased oxygen support, and decreased standing balance, decreased postural control, decreased balance strategies, and difficulty maintaining precautions.  Prior to hospitalization, patient could complete BADL with independent .  Patient will benefit from skilled intervention to increase independence with basic self-care skills prior to discharge home with care partner.  Anticipate patient will require intermittent supervision and follow up outpatient.  OT - End of Session Activity Tolerance: Tolerates < 10 min activity with changes in vital signs Endurance Deficit: Yes Endurance Deficit Description: rest breaks within BADL Tasks OT Assessment Rehab Potential (ACUTE ONLY): Excellent OT Patient demonstrates impairments in the following area(s): Balance;Endurance;Pain;Nutrition;Sensory OT Basic ADL's Functional Problem(s): Bathing;Dressing;Toileting OT Transfers Functional Problem(s): Toilet;Tub/Shower OT Additional Impairment(s): None OT Plan OT Intensity: Minimum of 1-2 x/day, 45 to 90 minutes OT Frequency: 5 out of 7 days OT Duration/Estimated Length of Stay: 7-10 days OT Treatment/Interventions: Balance/vestibular training;Cognitive  remediation/compensation;Community  reintegration;Discharge planning;Disease mangement/prevention;DME/adaptive equipment instruction;Functional electrical stimulation;Functional mobility training;Neuromuscular re-education;Pain management;Patient/family education;Psychosocial support;Self Care/advanced ADL retraining;Skin care/wound managment;Splinting/orthotics;Therapeutic Activities;Therapeutic Exercise;UE/LE Strength taining/ROM;UE/LE Coordination activities;Visual/perceptual remediation/compensation;Wheelchair propulsion/positioning OT Basic Self-Care Anticipated Outcome(s): Mod I OT Toileting Anticipated Outcome(s): Mod I OT Bathroom Transfers Anticipated Outcome(s): Mod I OT Recommendation Patient destination: Home Follow Up Recommendations: Outpatient OT Equipment Recommended: To be determined   OT Evaluation Precautions/Restrictions  Precautions Precautions: Sternal Precaution Booklet Issued: Yes (comment) Restrictions Weight Bearing Restrictions: No Other Position/Activity Restrictions: sternal precautions Pain Pain Assessment Pain Scale: 0-10 Pain Score: 8  Pain Type: Surgical pain Pain Location: Chest Pain Orientation: Anterior Pain Descriptors / Indicators: Burning Pain Onset: On-going Pain Intervention(s): Medication (See eMAR) Home Living/Prior Functioning Home Living Family/patient expects to be discharged to:: Private residence Living Arrangements: Spouse/significant other Available Help at Discharge: Family, Available 24 hours/day Type of Home: House Home Access: Stairs to enter Secretary/administrator of Steps: 6 Entrance Stairs-Rails: Left Home Layout: One level Bathroom Shower/Tub: Associate Professor: Yes  Lives With: Spouse IADL History Occupation: Full time employment Type of Occupation: Psychologist, sport and exercise Prior Function Level of Independence: Independent with basic ADLs, Independent with homemaking with ambulation Vision Baseline Vision/History:  1 Wears glasses Ability to See in Adequate Light: 0 Adequate Patient Visual Report: No change from baseline Vision Assessment?: No apparent visual deficits Cognition Cognition Overall Cognitive Status: Within Functional Limits for tasks assessed Arousal/Alertness: Awake/alert Orientation Level: Person;Place;Situation Person: Oriented Place: Oriented Situation: Oriented Memory: Appears intact Safety/Judgment: Appears intact Brief Interview for Mental Status (BIMS) Repetition of Three Words (First Attempt): 3 Temporal Orientation: Year: Correct Temporal Orientation: Month: Accurate within 5 days Temporal Orientation: Day: Correct Recall: "Sock": Yes, no cue required Recall: "Blue": Yes, no cue required Recall: "Bed": Yes, after cueing ("a piece of furniture") BIMS Summary Score: 14 Sensation Sensation Light Touch: Impaired Detail Light Touch Impaired Details: Impaired RUE;Impaired LUE (Impaired in B finger tips s/p CABG) Coordination Gross Motor Movements are Fluid and Coordinated: No Fine Motor Movements are Fluid and Coordinated: Yes Motor     Trunk/Postural Assessment     Balance Static Sitting Balance Static Sitting - Balance Support: Feet supported Static Sitting - Level of Assistance: 6: Modified independent (Device/Increase time) Dynamic Sitting Balance Dynamic Sitting - Level of Assistance: 5: Stand by assistance Static Standing Balance Static Standing - Balance Support: During functional activity Static Standing - Level of Assistance: 5: Stand by assistance;4: Min assist (CGA) Dynamic Standing Balance Dynamic Standing - Balance Support: During functional activity Dynamic Standing - Level of Assistance: 4: Min assist;5: Stand by assistance (CGA) Extremity/Trunk Assessment RUE Assessment RUE Assessment: Not tested General Strength Comments: due to sternal precautions LUE Assessment LUE Assessment: Not tested General Strength Comments: due to sternal  precautions  Care Tool Care Tool Self Care Eating   Eating Assist Level: Independent    Oral Care    Oral Care Assist Level: Supervision/Verbal cueing    Bathing   Body parts bathed by patient: Abdomen;Chest;Left arm;Right arm;Buttocks;Front perineal area;Right upper leg;Left upper leg;Left lower leg;Face;Right lower leg     Assist Level: Contact Guard/Touching assist    Upper Body Dressing(including orthotics)   What is the patient wearing?: Pull over shirt   Assist Level: Contact Guard/Touching assist    Lower Body Dressing (excluding footwear)   What is the patient wearing?: Pants;Underwear/pull up Assist for lower body dressing: Minimal Assistance - Patient > 75%    Putting on/Taking off footwear   What is the patient  wearing?: Non-skid slipper socks Assist for footwear: Minimal Assistance - Patient > 75%       Care Tool Toileting Toileting activity   Assist for toileting: Contact Guard/Touching assist     Care Tool Bed Mobility Roll left and right activity        Sit to lying activity        Lying to sitting on side of bed activity   Lying to sitting on side of bed assist level: the ability to move from lying on the back to sitting on the side of the bed with no back support.: Minimal Assistance - Patient > 75%     Care Tool Transfers Sit to stand transfer   Sit to stand assist level: Minimal Assistance - Patient > 75%    Chair/bed transfer   Chair/bed transfer assist level: Minimal Assistance - Patient > 75%     Toilet transfer   Assist Level: Minimal Assistance - Patient > 75%     Care Tool Cognition  Expression of Ideas and Wants Expression of Ideas and Wants: 4. Without difficulty (complex and basic) - expresses complex messages without difficulty and with speech that is clear and easy to understand  Understanding Verbal and Non-Verbal Content Understanding Verbal and Non-Verbal Content: 4. Understands (complex and basic) - clear comprehension  without cues or repetitions   Memory/Recall Ability Memory/Recall Ability : Staff names and faces;Location of own room;Current season;That he or she is in a hospital/hospital unit   Refer to Care Plan for Long Term Goals  SHORT TERM GOAL WEEK 1 OT Short Term Goal 1 (Week 1): LTG=STG 2.2 ELOS  Recommendations for other services: None    Skilled Therapeutic Intervention OT eval completed addressing rehab process, OT purpose, POC, ELOS, and goals.  Pt ambulated without device and Min for bathroom transfers, overall Min A for BADL tasks. Patient SpO2 at 93 on RA at rest, but then desatted to 78% with activity requiring rest break, deep breathing and 3L of O2 to maintain SpO2 between 91-94% with activity. Patient requiring multiple rest breaks due to endurance deficits. See below for further details regarding BADL performance.    ADL ADL Eating: Independent Grooming: Supervision/safety Upper Body Bathing: Supervision/safety Lower Body Bathing: Minimal assistance Upper Body Dressing: Minimal assistance Lower Body Dressing: Minimal assistance Toileting: Minimal assistance Toilet Transfer: Minimal assistance Mobility  Bed Mobility Bed Mobility: Supine to Sit Supine to Sit: Minimal Assistance - Patient > 75% Transfers Sit to Stand: Minimal Assistance - Patient > 75% Stand to Sit: Minimal Assistance - Patient > 75%   Discharge Criteria: Patient will be discharged from OT if patient refuses treatment 3 consecutive times without medical reason, if treatment goals not met, if there is a change in medical status, if patient makes no progress towards goals or if patient is discharged from hospital.  The above assessment, treatment plan, treatment alternatives and goals were discussed and mutually agreed upon: by patient  [name] 08/25/2023, 10:48 AM

## 2023-08-25 NOTE — Plan of Care (Signed)
  Problem: RH Balance Goal: LTG Patient will maintain dynamic standing with ADLs (OT) Description: LTG:  Patient will maintain dynamic standing balance with assist during activities of daily living (OT)  Flowsheets (Taken 08/25/2023 1011) LTG: Pt will maintain dynamic standing balance during ADLs with: Independent with assistive device   Problem: Sit to Stand Goal: LTG:  Patient will perform sit to stand in prep for activites of daily living with assistance level (OT) Description: LTG:  Patient will perform sit to stand in prep for activites of daily living with assistance level (OT) Flowsheets (Taken 08/25/2023 1011) LTG: PT will perform sit to stand in prep for activites of daily living with assistance level: Independent with assistive device   Problem: RH Bathing Goal: LTG Patient will bathe all body parts with assist levels (OT) Description: LTG: Patient will bathe all body parts with assist levels (OT) Flowsheets (Taken 08/25/2023 1011) LTG: Pt will perform bathing with assistance level/cueing: Independent with assistive device    Problem: RH Dressing Goal: LTG Patient will perform lower body dressing w/assist (OT) Description: LTG: Patient will perform lower body dressing with assist, with/without cues in positioning using equipment (OT) Flowsheets (Taken 08/25/2023 1011) LTG: Pt will perform lower body dressing with assistance level of: Independent with assistive device   Problem: RH Toileting Goal: LTG Patient will perform toileting task (3/3 steps) with assistance level (OT) Description: LTG: Patient will perform toileting task (3/3 steps) with assistance level (OT)  Flowsheets (Taken 08/25/2023 1011) LTG: Pt will perform toileting task (3/3 steps) with assistance level: Independent with assistive device

## 2023-08-25 NOTE — Progress Notes (Signed)
Inpatient Rehabilitation  Patient information reviewed and entered into eRehab system by Oyuki Hogan M. Eri Mcevers, M.A., CCC/SLP, PPS Coordinator.  Information including medical coding, functional ability and quality indicators will be reviewed and updated through discharge.    

## 2023-08-25 NOTE — Evaluation (Signed)
Physical Therapy Assessment and Plan  Patient Details  Name: Renee Ho MRN: [id number] Date of Birth: March 25, 1976  PT Diagnosis: Difficulty walking, Muscle weakness, and Pain in sternum Rehab Potential: Good ELOS: 7-10 days   Today's Date: 08/25/2023 PT Individual Time: 1031-1140 PT Individual Time Calculation (min): 69 min    Hospital Problem: Principal Problem:   Debility   Past Medical History:  Past Medical History:  Diagnosis Date   Depression    Diabetes mellitus    Diabetes mellitus without complication (HCC)    Diverticula of intestine    High cholesterol    Hypertension    OSA on CPAP    CPAP pressure= 15   Polycystic ovarian syndrome    Sleep apnea    on CPAP with pressure setting= 15   Past Surgical History:  Past Surgical History:  Procedure Laterality Date   ABDOMINAL HYSTERECTOMY     CORONARY ARTERY BYPASS GRAFT N/A 08/19/2023   Procedure: CORONARY ARTERY BYPASS GRAFTING (CABG) X THREE, USING LEFT INTERNAL MAMMARY ARTERY AND RIGHT GREATER SAPHENEOUS VEIN HARVESTED ENDOSCOPICLY;  Surgeon: [name], MD;  Location: [facility] OR;  Service: Open Heart Surgery;  Laterality: N/A;   CORONARY STENT INTERVENTION N/A 07/23/2023   Procedure: CORONARY STENT INTERVENTION;  Surgeon: Kathleene Hazel, MD;  Location: [facility] INVASIVE CV LAB;  Service: Cardiovascular;  Laterality: N/A;   CYST REMOVAL NECK     KNEE ARTHROSCOPY  07/11/2012   Procedure: ARTHROSCOPY KNEE;  Surgeon: Cammy Copa, MD;  Location: [facility] OR;  Service: Orthopedics;  Laterality: Right;  Right knee arthroscopy with debridement   KNEE SURGERY     LEFT HEART CATH AND CORONARY ANGIOGRAPHY N/A 07/23/2023   Procedure: LEFT HEART CATH AND CORONARY ANGIOGRAPHY;  Surgeon: Kathleene Hazel, MD;  Location: [facility] INVASIVE CV LAB;  Service: Cardiovascular;  Laterality: N/A;   OVARIAN CYST REMOVAL     TEE WITHOUT CARDIOVERSION N/A 08/19/2023   Procedure: TRANSESOPHAGEAL ECHOCARDIOGRAM;   Surgeon: [name], MD;  Location: [facility] OR;  Service: Open Heart Surgery;  Laterality: N/A;   WISDOM TOOTH EXTRACTION  05/11/2017    Assessment & Plan Clinical Impression: Patient is a 47 year old female with CAD who underwent CABG time 4 vessels by dr. Dorris Fetch on 08/19/2023. She had previously undergone stent placement to LCX on 07/23/2023 after presenting to the ED with sub-sternal chest pain. LVEF was 60-65% with moderate symmetric hypertrophy of septal segment and grad I diastolic dysfunction. Discharged on DAPT and to consider CABG if failed medically. Follows with Dr. Odis Hollingshead. She continued to be symptomatic with shortness of breath and exercise intolerance. Referral make to cardiothoracic surgery; however, she presented to the ED on 08/14/2023 with chest pain. Lab work unremarkable. EKG and troponins normal. PMH includes active sleep apnea on CPAP, polycystic ovarian syndrome, diabetes mellitus, hyper cholesterolemia, hypertension, depression/anxiety. History of tobacco abuse recently quit.  She was admitted by [facility] and cardiology consulted.  She continued to have exertional chest pain and dyspnea.  CRP was negative and no pericarditis.  Her BNP was mildly elevated at 119.  Medical management was limited due to low normal blood pressure and heart rate as well as dizziness.  She underwent cardiothoracic evaluation on 11/05 and agreed to proceed with CABG for residual LAD and diagonal disease.  She was transitioned from Brilinta to cangrelor intravenously.  On 11/08, she underwent coronary artery bypass grafting x 4 with vein harvest from right thigh. Postoperatively she had low BP and could not  tolerate Lasix for diuresis. Poor PO intake. Reportedly had had no bowel movement in 11 days. Episode of SVT 11/12 while toileting lasting about 20 minutes. Cardiology re-consulted on 11/12.  Unable to tolerate beta-blocker dosage due to low blood pressure.  She was started on amiodarone with plans to  continue 200 mg twice daily for 1 week before going to maintenance dose of 200 mg daily.  Further episodes of SVT.  Reported BM on 11/12.  Now on aspirin and Plavix.  She reports pain and weakness in her left shoulder since her surgery.  She also has a burning sensation around her left shoulder.  The patient requires inpatient medicine and rehabilitation evaluations and services for ongoing dysfunction secondary to CAD.  Patient currently requires min with mobility secondary to muscle weakness, decreased cardiorespiratoy endurance and decreased oxygen support, and decreased standing balance, decreased postural control, and decreased balance strategies.  Prior to hospitalization, patient was independent  with mobility and lived with Spouse in a House home.  Home access is 6Stairs to enter.  Patient will benefit from skilled PT intervention to maximize safe functional mobility and minimize fall risk for planned discharge home with intermittent assist.  Anticipate patient will benefit from follow up OP at discharge.  PT - End of Session Activity Tolerance: Tolerates 30+ min activity with multiple rests Endurance Deficit: Yes Endurance Deficit Description: rest breaks with all mobility PT Assessment Rehab Potential (ACUTE/IP ONLY): Good PT Barriers to Discharge: Home environment access/layout PT Barriers to Discharge Comments: 6 STE [facility] PT Patient demonstrates impairments in the following area(s): Balance;Endurance;Pain;Safety;Sensory PT Transfers Functional Problem(s): Bed Mobility;Bed to Chair;Car PT Locomotion Functional Problem(s): Ambulation;Stairs PT Plan PT Intensity: Minimum of 1-2 x/day ,45 to 90 minutes PT Frequency: 5 out of 7 days PT Duration Estimated Length of Stay: 7-10 days PT Treatment/Interventions: Ambulation/gait training;Discharge planning;Functional mobility training;Therapeutic Activities;Balance/vestibular training;Disease management/prevention;Neuromuscular  re-education;Therapeutic Exercise;Cognitive remediation/compensation;DME/adaptive equipment instruction;Pain management;UE/LE Strength taining/ROM;Community reintegration;Patient/family education;Stair training;UE/LE Coordination activities PT Transfers Anticipated Outcome(s): modI PT Locomotion Anticipated Outcome(s): modI ambulatory PT Recommendation Recommendations for Other Services: None Follow Up Recommendations: Outpatient PT Patient destination: Home Equipment Recommended: To be determined   PT Evaluation Precautions/Restrictions Precautions Precautions: Sternal Precaution Booklet Issued: Yes (comment) Restrictions Weight Bearing Restrictions: No Other Position/Activity Restrictions: sternal precautions Pain Interference Pain Interference Pain Effect on Sleep: 4. Almost constantly Pain Interference with Therapy Activities: 4. Almost constantly Pain Interference with Day-to-Day Activities: 4. Almost constantly Home Living/Prior Functioning Home Living Available Help at Discharge: Family;Available 24 hours/day Type of Home: House Home Access: Stairs to enter [employer] of Steps: 6 Entrance Stairs-Rails: Left Home Layout: One level Bathroom Shower/Tub: Engineer, manufacturing systems: Standard Bathroom Accessibility: Yes Additional Comments: Has Cpap machine  Lives With: Spouse Prior Function Level of Independence: Independent with basic ADLs;Independent with homemaking with ambulation  Able to Take Stairs?: Yes Driving: Yes Vocation: Full time employment Vocation Requirements: CNA Vision/Perception  Vision - History Ability to See in Adequate Light: 0 Adequate  Cognition Overall Cognitive Status: Within Functional Limits for tasks assessed Arousal/Alertness: Awake/alert Orientation Level: Oriented X4 Memory: Appears intact Safety/Judgment: Appears intact Sensation Sensation Light Touch: Impaired by gross assessment Light Touch Impaired Details:  Impaired LUE;Impaired RUE (numbness in fingers on bilateral hands) Coordination Gross Motor Movements are Fluid and Coordinated: No Fine Motor Movements are Fluid and Coordinated: Yes Coordination and Movement Description: generalized weakness, endurance deficits Motor  Motor Motor: Within Functional Limits Motor - Skilled Clinical Observations: endurance deficit  Trunk/Postural Assessment  Cervical Assessment Cervical  Assessment: Within Functional Limits Thoracic Assessment Thoracic Assessment: Within Functional Limits Lumbar Assessment Lumbar Assessment: Within Functional Limits Postural Control Postural Control: Within Functional Limits  Balance Balance Balance Assessed: Yes Static Sitting Balance Static Sitting - Balance Support: Feet supported Static Sitting - Level of Assistance: 6: Modified independent (Device/Increase time) Dynamic Sitting Balance Dynamic Sitting - Level of Assistance: 5: Stand by assistance Static Standing Balance Static Standing - Balance Support: During functional activity Static Standing - Level of Assistance: 5: Stand by assistance;4: Min assist (CGA) Dynamic Standing Balance Dynamic Standing - Balance Support: During functional activity Dynamic Standing - Level of Assistance: 4: Min assist;5: Stand by assistance (CGA) Extremity Assessment  RUE Assessment RUE Assessment: Not tested General Strength Comments: due to sternal precautions LUE Assessment LUE Assessment: Not tested General Strength Comments: due to sternal precautions RLE Assessment RLE Assessment: Exceptions to [facility] General Strength Comments: grossly 3+/5, limited due to pain at incision site LLE Assessment LLE Assessment: Exceptions to [facility] General Strength Comments: grossly 4+/5  Care Tool Care Tool Bed Mobility Roll left and right activity   Roll left and right assist level: Supervision/Verbal cueing    Sit to lying activity   Sit to lying assist level: Contact  Guard/Touching assist    Lying to sitting on side of bed activity   Lying to sitting on side of bed assist level: the ability to move from lying on the back to sitting on the side of the bed with no back support.: Minimal Assistance - Patient > 75%     Care Tool Transfers Sit to stand transfer   Sit to stand assist level: Contact Guard/Touching assist    Chair/bed transfer   Chair/bed transfer assist level: Contact Guard/Touching assist    Car transfer   Car transfer assist level: Contact Guard/Touching assist (simulated with into/out of bed)      Care Tool Locomotion Ambulation   Assist level: Contact Guard/Touching assist Assistive device: Rollator Max distance: 175'  Walk 10 feet activity   Assist level: Contact Guard/Touching assist Assistive device: Rollator   Walk 50 feet with 2 turns activity   Assist level: Contact Guard/Touching assist Assistive device: Rollator  Walk 150 feet activity   Assist level: Contact Guard/Touching assist Assistive device: Rollator  Walk 10 feet on uneven surfaces activity Walk 10 feet on uneven surfaces activity did not occur: Safety/medical concerns      Stairs   Assist level: Contact Guard/Touching assist Stairs assistive device: 2 hand rails Max number of stairs: 4  Walk up/down 1 step activity   Walk up/down 1 step (curb) assist level: Contact Guard/Touching assist Walk up/down 1 step or curb assistive device: 2 hand rails  Walk up/down 4 steps activity   Walk up/down 4 steps assist level: Contact Guard/Touching assist Walk up/down 4 steps assistive device: 2 hand rails  Walk up/down 12 steps activity Walk up/down 12 steps activity did not occur: Safety/medical concerns      Pick up small objects from floor Pick up small object from the floor (from standing position) activity did not occur: Safety/medical concerns      Wheelchair Is the patient using a wheelchair?: Yes Type of Wheelchair: Manual   Wheelchair assist level:  Dependent - Patient 0%    Wheel 50 feet with 2 turns activity   Assist Level: Dependent - Patient 0%  Wheel 150 feet activity   Assist Level: Dependent - Patient 0%    Refer to Care Plan for Long Term Goals  SHORT  TERM GOAL WEEK 1 PT Short Term Goal 1 (Week 1): STG = LTG due to ELOS  Recommendations for other services: None   Skilled Therapeutic Intervention Evaluation completed (see details above and below) with education on PT POC and goals and individual treatment initiated with focus on gait training and tolerance to activity with vitals monitoring throughout. Pt reporting pain at start of session at incision site and states beneath sternum. RN present at start of session to provide lidocaine patch to incision site. Pt reporting fatigue from earlier session but agreeable to PT. Vitals assessed prior to mobility in sitting with pt demonstrating BP 109/62, HR 74bpm, SpO2 89% on room air and pt reporting nausea that has been ongoing but requesting to use restroom. 3L supplemental O2 donned, pt ambulates to/from bathroom with CGA no device, slow gait speed, and BUEs in high guard, completes toilet transfer with CGA and able to complete 3/3 toileting tasks with CGA. Pt tolerates subjective and objective portions of eval with no increase in pain, vitals assessed following mobility once seated in WC BP 124/66, HR 80 bpm, and SpO2 97% at 3L O2. Pt transported from room to day room via WC dependently due to sternal precautions and for energy conservation. Pt provided with rollator, completes sit<>stand maintaining sternal precautions CGA. Pt ambulates with rollator, slow gait speed 175' with very light UE support on rollator. Pt provided with education on how to sit with rollator for rest break however pt able to ambulate to/from [facility] without seated rest break. Pt reporting significant fatigue following activity, vitals assessed to be BP 122/59, HR 76 bpm, and SpO2 98% on 3L O2. Pt requires extended seated  rest break following gait trial, with c/o feeling "hot," provided with cool wash cloth to neck, fan, and cold drink with pt reporting improvement. Pt transported to main gym dependently in WC. Pt completes up/down 4 steps with light UE support on BHRs CGA, step to gait pattern. Pt requires rest break following this activity due to fatigue. Pt returned to room and completes ambulatory transfer back to bed with CGA, places LUE onto bed prior to sitting then positions in long sitting all while maintaining sternal precautions. Pt positioned in supine with pillows for comfort, all for 4 bed rails raised per pt request and HOB elevated. Pt placed on room air and pt vitals monitored for 3 min with pt demonstrating SpO2 >93%. Pt remains supine with all needs within reach, call light in place at end of session.   Mobility Bed Mobility Bed Mobility: Sit to Supine Supine to Sit: Minimal Assistance - Patient > 75% Sit to Supine: Contact Guard/Touching assist Transfers Transfers: Stand to Sit;Sit to Stand;Stand Pivot Transfers Sit to Stand: Contact Guard/Touching assist Stand to Sit: Contact Guard/Touching assist Stand Pivot Transfers: Contact Guard/Touching assist Transfer (Assistive device): None Locomotion  Gait Ambulation: Yes Gait Assistance: Contact Guard/Touching assist Gait Distance (Feet): 175 Feet Assistive device: Rollator Gait Gait: Yes Gait Pattern: Impaired Gait Pattern: Decreased stride length Gait velocity: decreased Stairs / Additional Locomotion Stairs: Yes Stairs Assistance: Contact Guard/Touching assist Stair Management Technique: Two rails Number of Stairs: 4 Height of Stairs: 6 Wheelchair Mobility Wheelchair Mobility: No   Discharge Criteria: Patient will be discharged from PT if patient refuses treatment 3 consecutive times without medical reason, if treatment goals not met, if there is a change in medical status, if patient makes no progress towards goals or if patient  is discharged from hospital.  The above assessment, treatment plan, treatment  alternatives and goals were discussed and mutually agreed upon: by patient  Edwin Cap PT, DPT 08/25/2023, 11:54 AM

## 2023-08-26 DIAGNOSIS — K567 Ileus, unspecified: Secondary | ICD-10-CM | POA: Insufficient documentation

## 2023-08-26 DIAGNOSIS — R5381 Other malaise: Secondary | ICD-10-CM | POA: Diagnosis not present

## 2023-08-26 LAB — BASIC METABOLIC PANEL
Anion gap: 12 (ref 5–15)
BUN: 11 mg/dL (ref 6–20)
CO2: 27 mmol/L (ref 22–32)
Calcium: 8.8 mg/dL — ABNORMAL LOW (ref 8.9–10.3)
Chloride: 96 mmol/L — ABNORMAL LOW (ref 98–111)
Creatinine, Ser: 0.98 mg/dL (ref 0.44–1.00)
GFR, Estimated: >60 mL/min (ref >60)
Glucose, Bld: 138 mg/dL — ABNORMAL HIGH (ref 70–99)
Potassium: 3.9 mmol/L (ref 3.5–5.1)
Sodium: 135 mmol/L (ref 135–145)

## 2023-08-26 LAB — GLUCOSE, CAPILLARY
Glucose-Capillary: 123 mg/dL — ABNORMAL HIGH (ref 70–99)
Glucose-Capillary: 125 mg/dL — ABNORMAL HIGH (ref 70–99)
Glucose-Capillary: 126 mg/dL — ABNORMAL HIGH (ref 70–99)
Glucose-Capillary: 146 mg/dL — ABNORMAL HIGH (ref 70–99)

## 2023-08-26 MED ORDER — LACTATED RINGERS IV SOLN: INTRAVENOUS | Status: DC

## 2023-08-26 MED ORDER — METFORMIN HCL 500 MG PO TABS
1000.0000 mg | ORAL_TABLET | Freq: Two times a day (BID) | ORAL | Status: DC
  Administered 2023-08-26 – 2023-08-31 (×10): 1000 mg via ORAL
  Filled 2023-08-26 (×10): qty 2

## 2023-08-26 MED ORDER — HYDROMORPHONE BOLUS VIA INFUSION: 0.2000 mg | Freq: Every day | INTRAVENOUS | Status: DC | PRN

## 2023-08-26 MED ORDER — TRAZODONE HCL 50 MG PO TABS
75.0000 mg | ORAL_TABLET | Freq: Every evening | ORAL | Status: DC | PRN
  Administered 2023-08-26 – 2023-08-30 (×5): 75 mg via ORAL
  Filled 2023-08-26 (×5): qty 2

## 2023-08-26 MED ORDER — FENOFIBRATE 160 MG PO TABS
160.0000 mg | ORAL_TABLET | Freq: Every day | ORAL | Status: DC
  Administered 2023-08-27 – 2023-08-30 (×4): 160 mg via ORAL
  Filled 2023-08-26 (×4): qty 1

## 2023-08-26 MED ORDER — TRAZODONE HCL 50 MG PO TABS: 50.0000 mg | ORAL_TABLET | Freq: Every evening | ORAL | Status: DC | PRN

## 2023-08-26 MED ORDER — OXYCODONE HCL 5 MG PO TABS: 5.0000 mg | ORAL_TABLET | Freq: Four times a day (QID) | ORAL | Status: DC | PRN

## 2023-08-26 MED ORDER — BISACODYL 10 MG RE SUPP: 10.0000 mg | Freq: Once | RECTAL | Status: AC

## 2023-08-26 MED ORDER — DOCUSATE SODIUM 283 MG RE ENEM
1.0000 | ENEMA | Freq: Once | RECTAL | Status: AC
  Administered 2023-08-26: 283 mg via RECTAL
  Filled 2023-08-26: qty 1

## 2023-08-26 MED ORDER — EZETIMIBE 10 MG PO TABS
10.0000 mg | ORAL_TABLET | Freq: Every day | ORAL | Status: DC
  Administered 2023-08-27 – 2023-08-31 (×5): 10 mg via ORAL
  Filled 2023-08-26 (×5): qty 1

## 2023-08-26 MED ORDER — TRAMADOL HCL 50 MG PO TABS: 50.0000 mg | ORAL_TABLET | Freq: Four times a day (QID) | ORAL | Status: DC | PRN

## 2023-08-26 MED ORDER — PROCHLORPERAZINE MALEATE 5 MG PO TABS
5.0000 mg | ORAL_TABLET | Freq: Three times a day (TID) | ORAL | Status: DC | PRN
  Administered 2023-08-27 – 2023-08-29 (×3): 5 mg via ORAL
  Filled 2023-08-26 (×3): qty 1

## 2023-08-26 MED ORDER — ONDANSETRON HCL 4 MG PO TABS
4.0000 mg | ORAL_TABLET | Freq: Three times a day (TID) | ORAL | Status: DC | PRN
  Administered 2023-08-26 – 2023-08-29 (×5): 4 mg via ORAL
  Filled 2023-08-26 (×6): qty 1

## 2023-08-26 MED ORDER — PANTOPRAZOLE SODIUM 40 MG PO TBEC
40.0000 mg | DELAYED_RELEASE_TABLET | Freq: Every day | ORAL | Status: DC
  Administered 2023-08-27 – 2023-08-31 (×5): 40 mg via ORAL
  Filled 2023-08-26 (×5): qty 1

## 2023-08-26 MED ORDER — FUROSEMIDE 40 MG PO TABS
40.0000 mg | ORAL_TABLET | Freq: Every day | ORAL | Status: DC
  Administered 2023-08-27: 40 mg via ORAL
  Filled 2023-08-26: qty 1

## 2023-08-26 MED ORDER — METHOCARBAMOL 750 MG PO TABS
750.0000 mg | ORAL_TABLET | Freq: Three times a day (TID) | ORAL | Status: DC
  Administered 2023-08-26 – 2023-08-31 (×15): 750 mg via ORAL
  Filled 2023-08-26 (×16): qty 1

## 2023-08-26 MED ORDER — FUROSEMIDE 10 MG/ML IJ SOLN: 20.0000 mg | Freq: Every day | INTRAMUSCULAR | Status: DC

## 2023-08-26 MED ORDER — ICOSAPENT ETHYL 1 G PO CAPS
2.0000 g | ORAL_CAPSULE | Freq: Two times a day (BID) | ORAL | Status: DC
  Administered 2023-08-26 – 2023-08-31 (×10): 2 g via ORAL
  Filled 2023-08-26 (×11): qty 2

## 2023-08-26 MED ORDER — POTASSIUM CHLORIDE CRYS ER 20 MEQ PO TBCR
20.0000 meq | EXTENDED_RELEASE_TABLET | Freq: Every day | ORAL | Status: DC
  Administered 2023-08-27 – 2023-08-28 (×2): 20 meq via ORAL
  Filled 2023-08-26 (×2): qty 1

## 2023-08-26 MED ORDER — TRAMADOL HCL 50 MG PO TABS
50.0000 mg | ORAL_TABLET | Freq: Four times a day (QID) | ORAL | Status: DC | PRN
  Administered 2023-08-26 – 2023-08-28 (×6): 100 mg via ORAL
  Administered 2023-08-28: 50 mg via ORAL
  Administered 2023-08-28 – 2023-08-31 (×6): 100 mg via ORAL
  Filled 2023-08-26 (×13): qty 2

## 2023-08-26 MED ORDER — MELATONIN 5 MG PO TABS
5.0000 mg | ORAL_TABLET | Freq: Every day | ORAL | Status: DC
  Administered 2023-08-26 – 2023-08-30 (×5): 5 mg via ORAL
  Filled 2023-08-26 (×5): qty 1

## 2023-08-26 MED ORDER — ONDANSETRON HCL 4 MG/2ML IJ SOLN
4.0000 mg | Freq: Four times a day (QID) | INTRAMUSCULAR | Status: DC | PRN
  Administered 2023-08-26: 4 mg via INTRAVENOUS
  Filled 2023-08-26: qty 2

## 2023-08-26 MED ORDER — HYDROMORPHONE HCL 1 MG/ML IJ SOLN
0.2000 mg | Freq: Every day | INTRAMUSCULAR | Status: DC | PRN
  Administered 2023-08-26: 0.2 mg via INTRAVENOUS
  Filled 2023-08-26: qty 1

## 2023-08-26 MED ORDER — OXYCODONE HCL 5 MG PO TABS
5.0000 mg | ORAL_TABLET | Freq: Four times a day (QID) | ORAL | Status: DC | PRN
  Administered 2023-08-26 – 2023-08-28 (×6): 10 mg via ORAL
  Administered 2023-08-28: 5 mg via ORAL
  Administered 2023-08-28 – 2023-08-31 (×10): 10 mg via ORAL
  Filled 2023-08-26 (×18): qty 2

## 2023-08-26 MED ORDER — FUROSEMIDE 20 MG PO TABS
20.0000 mg | ORAL_TABLET | Freq: Once | ORAL | Status: AC
  Administered 2023-08-26: 20 mg via ORAL
  Filled 2023-08-26: qty 1

## 2023-08-26 MED ORDER — PANTOPRAZOLE SODIUM 40 MG IV SOLR: 40.0000 mg | INTRAVENOUS | Status: DC

## 2023-08-26 MED ORDER — DULOXETINE HCL 30 MG PO CPEP
90.0000 mg | ORAL_CAPSULE | Freq: Every day | ORAL | Status: DC
  Administered 2023-08-27 – 2023-08-31 (×5): 90 mg via ORAL
  Filled 2023-08-26 (×5): qty 1

## 2023-08-26 NOTE — IPOC Note (Signed)
Overall Plan of Care [facility]) Patient Details Name: Renee Ho MRN: [id number] DOB: 1975/10/14  Admitting Diagnosis: Debility  Hospital Problems: Principal Problem:   Debility Active Problems:   Ileus (HCC)     Functional Problem List: Nursing Bladder, Bowel, Endurance, Medication Management, Pain, Safety  PT Balance, Endurance, Pain, Safety, Sensory  OT Balance, Endurance, Pain, Nutrition, Sensory  SLP    TR         Basic ADL's: OT Bathing, Dressing, Toileting     Advanced  ADL's: OT       Transfers: PT Bed Mobility, Bed to Chair, Customer service manager, Tub/Shower     Locomotion: PT Ambulation, Stairs     Additional Impairments: OT None  SLP        TR      Anticipated Outcomes Item Anticipated Outcome  Self Feeding    Swallowing      Basic self-care  Mod I  Toileting  Mod I   Bathroom Transfers Mod I  Bowel/Bladder  continent of bowel/bladder  Transfers  modI  Locomotion  modI ambulatory  Communication     Cognition     Pain  less than 4  Safety/Judgment  with cueing   Therapy Plan: PT Intensity: Minimum of 1-2 x/day ,45 to 90 minutes PT Frequency: 5 out of 7 days PT Duration Estimated Length of Stay: 7-10 days OT Intensity: Minimum of 1-2 x/day, 45 to 90 minutes OT Frequency: 5 out of 7 days OT Duration/Estimated Length of Stay: 7-10 days     Team Interventions: Nursing Interventions Patient/Family Education, Bladder Management, Bowel Management, Disease Management/Prevention, Pain Management, Medication Management, Discharge Planning, Psychosocial Support  PT interventions Ambulation/gait training, Discharge planning, Functional mobility training, Therapeutic Activities, Balance/vestibular training, Disease management/prevention, Neuromuscular re-education, Therapeutic Exercise, Cognitive remediation/compensation, DME/adaptive equipment instruction, Pain management, UE/LE Strength taining/ROM, Community reintegration,  Equities trader education, Museum/gallery curator, UE/LE Coordination activities  OT Interventions Warden/ranger, Cognitive remediation/compensation, Firefighter, Discharge planning, Disease mangement/prevention, Fish farm manager, Functional electrical stimulation, Functional mobility training, Neuromuscular re-education, Pain management, Patient/family education, Psychosocial support, Self Care/advanced ADL retraining, Skin care/wound managment, Splinting/orthotics, Therapeutic Activities, Therapeutic Exercise, UE/LE Strength taining/ROM, UE/LE Coordination activities, Visual/perceptual remediation/compensation, Wheelchair propulsion/positioning  SLP Interventions    TR Interventions    SW/CM Interventions Discharge Planning, Psychosocial Support, Patient/Family Education   Barriers to Discharge MD  Medical stability, Home enviroment access/loayout, Lack of/limited family support, Insurance for SNF coverage, and Weight  Nursing Decreased caregiver support, Home environment access/layout, Incontinence, Weight, Weight bearing restrictions 1 level with 6 steps, rail on left  PT Home environment access/layout 6 STE LHR  OT      SLP      SW Decreased caregiver support, Lack of/limited family support, Community education officer for SNF coverage     Team Discharge Planning: Destination: PT-Home ,OT- Home , SLP-  Projected Follow-up: PT-Outpatient PT, OT-  Outpatient OT, SLP-  Projected Equipment Needs: PT-To be determined, OT- To be determined, SLP-  Equipment Details: PT- , OT-  Patient/family involved in discharge planning: PT- Patient,  OT-Patient, SLP-   MD ELOS: 7-10 days Medical Rehab Prognosis:  Good Assessment: The patient has been admitted for CIR therapies with the diagnosis of debility due to CAD s/p CABG. The team will be addressing functional mobility, strength, stamina, balance, safety, adaptive techniques and equipment, self-care, bowel and bladder mgt, patient  and caregiver education. Goals have been set at Mod I. Anticipated discharge destination is home.       See Team Conference  Notes for weekly updates to the plan of care

## 2023-08-26 NOTE — Progress Notes (Addendum)
Patient ID: Renee Ho, female   DOB: 1976/04/12, 47 y.o.   MRN: [id number]  [id number] left message for pt dtr Cristie Hem [phone]) to inform on ELOS, and requested return phone call to discuss scheduling family edu. SW waiting on follow-up. *SW spoke with pt dtr Desiree about fam edu. Scheduled for Sunday 10am-12pm.   Cecile Sheerer, MSW, LCSW Office: [phone] Cell: [phone] Fax: [phone]

## 2023-08-26 NOTE — Progress Notes (Signed)
Recreational Therapy Session Note  Patient Details  Name: Joyous Loehrer MRN: [id number] Date of Birth: 1976/03/05 Today's Date: 08/26/2023  Pain:no c/o  Pt participated in animal assisted activity seated w/c level with supervision.  Devyne Hauger 08/26/2023, 12:48 PM

## 2023-08-26 NOTE — Progress Notes (Signed)
Inpatient Rehabilitation Care Coordinator Assessment and Plan Patient Details  Name: Renee Ho MRN: [id number] Date of Birth: 1976/07/23  Today's Date: 08/26/2023  Hospital Problems: Principal Problem:   Debility  Past Medical History:  Past Medical History:  Diagnosis Date   Depression    Diabetes mellitus    Diabetes mellitus without complication (HCC)    Diverticula of intestine    High cholesterol    Hypertension    OSA on CPAP    CPAP pressure= 15   Polycystic ovarian syndrome    Sleep apnea    on CPAP with pressure setting= 15   Past Surgical History:  Past Surgical History:  Procedure Laterality Date   ABDOMINAL HYSTERECTOMY     CORONARY ARTERY BYPASS GRAFT N/A 08/19/2023   Procedure: CORONARY ARTERY BYPASS GRAFTING (CABG) X THREE, USING LEFT INTERNAL MAMMARY ARTERY AND RIGHT GREATER SAPHENEOUS VEIN HARVESTED ENDOSCOPICLY;  Surgeon: [name], MD;  Location: [facility] OR;  Service: Open Heart Surgery;  Laterality: N/A;   CORONARY STENT INTERVENTION N/A 07/23/2023   Procedure: CORONARY STENT INTERVENTION;  Surgeon: Kathleene Hazel, MD;  [facility]: [facility];  Service: Cardiovascular;  Laterality: N/A;   CYST REMOVAL NECK     KNEE ARTHROSCOPY  07/11/2012   Procedure: ARTHROSCOPY KNEE;  Surgeon: Cammy Copa, MD;  Location: [facility] OR;  Service: Orthopedics;  Laterality: Right;  Right knee arthroscopy with debridement   KNEE SURGERY     LEFT HEART CATH AND CORONARY ANGIOGRAPHY N/A 07/23/2023   Procedure: LEFT HEART CATH AND CORONARY ANGIOGRAPHY;  Surgeon: Kathleene Hazel, MD;  Location: [facility];  Service: Cardiovascular;  Laterality: N/A;   OVARIAN CYST REMOVAL     TEE WITHOUT CARDIOVERSION N/A 08/19/2023   Procedure: TRANSESOPHAGEAL ECHOCARDIOGRAM;  Surgeon: [name], MD;  Location: [facility] OR;  Service: Open Heart Surgery;  Laterality: N/A;   WISDOM TOOTH EXTRACTION  05/11/2017   Social History:  reports  that she has been smoking cigarettes. She has a 10 pack-year smoking history. She has never used smokeless tobacco. She reports that she does not drink alcohol and does not use drugs.  Family / Support Systems Marital Status: Married How Long?: 7 years Patient Roles: Spouse, Parent Spouse/Significant Other: Renee Ho (husband) Children: 1 dtr- Renee Ho Other Supports: none reported Anticipated Caregiver: husband and dtr Ability/Limitations of Caregiver: Husband has some physical limitations, and not able to provide as much assistance. Pt dtr works FT and only avilable PRN in the evening. Caregiver Availability: Intermittent Family Dynamics: Pt lives with her husband  Social History Preferred language: English Religion: Non-Denominational Cultural Background: pt works as a Radio producer: college Primary school teacher - How often do you need to have someone help you when you read instructions, pamphlets, or other written material from your doctor or pharmacy?: Never Writes: Yes Employment Status: Employed Name of Employer: [employer] Return to Work Plans: TBD Marine scientist Issues: Denies Guardian/Conservator: HCPOA- dtr Renee Ho. ADvanced Care Directive on file   Abuse/Neglect Abuse/Neglect Assessment Can Be Completed: Yes Physical Abuse: Denies Verbal Abuse: Denies Sexual Abuse: Denies Exploitation of patient/patient's resources: Denies Self-Neglect: Denies  Patient response to: Social Isolation - How often do you feel lonely or isolated from those around you?: Never  Emotional Status Pt's affect, behavior and adjustment status: Pt in good spirits at time of visit. Adjusting as well as to be expected given curent changes. Recent Psychosocial Issues: See above Psychiatric History: hx of dperession, and medications prescribed by PCP.  Substance Abuse History: Reports she stopped smoking cigarettes Oct 12 after stent placed; long hx- since 47 yrs old.  Patient / Family  Perceptions, Expectations & Goals Pt/Family understanding of illness & functional limitations: Pt and family have a general understanding of pt care needs Premorbid pt/family roles/activities: Independent Anticipated changes in roles/activities/participation: Assistance with ADLs/IADLs Pt/family expectations/goals: pt goal is to work on "getting back to be able to do something on my own, endurance, self-care, and resolving pain."  [employer]: None Premorbid Home Care/DME Agencies: None Transportation available at discharge: TBD Is the patient able to respond to transportation needs?: Yes In the past 12 months, has lack of transportation kept you from medical appointments or from getting medications?: No In the past 12 months, has lack of transportation kept you from meetings, work, or from getting things needed for daily living?: No Resource referrals recommended: Neuropsychology  Discharge Planning Living Arrangements: Spouse/significant other Support Systems: Spouse/significant other, Children Type of Residence: Private residence Insurance Resources: Media planner (specify) ([place] [employer], dispensing) Financial Resources: Employment Surveyor, quantity Screen Referred: No Living Expenses: Banker Management: Patient Does the patient have any problems obtaining your medications?: No Home Management: Pt manages all home care needs Patient/Family Preliminary Plans: TBD Care Coordinator Barriers to Discharge: Decreased caregiver support, Lack of/limited family support, Insurance for SNF coverage Care Coordinator Anticipated Follow Up Needs: HH/OP  Clinical Impression SW met with pt in room to introduce self, explain role, and discuss discharge process. Pt is not a Cytogeneticist. HCPOA- dtr Renee Ho and on file. No DME.   Blain Hunsucker A Hau Sanor 08/26/2023, 11:08 AM

## 2023-08-26 NOTE — Progress Notes (Addendum)
Physical Therapy Session Note  Patient Details  Name: Renee Ho MRN: [id number] Date of Birth: [birth year]  Today's Date: 08/26/2023 PT Individual Time: 6578-4696 PT Individual Time Calculation (min): 45 min   Short Term Goals: Week 1:  PT Short Term Goal 1 (Week 1): STG = LTG due to ELOS  Skilled Therapeutic Interventions/Progress Updates:    Pt presents seated in WC on room air behind nurse's station, agreeable to PT. Pt reporting ongoing pain at incision site and behind sternum and ongoing nausea but overall reports feeling better than yesterday. Session focused on therapeutic activity greeting therapy dog for improved participation and overall affect with therapy as well as interval gait training with emphasis on breath control. Pt completes all transfers with CGA throughout session, maintains sternal precautions without cues.  Pt greets therapy dog in sitting in WC at beginning of session. Pt then transported to day room dependently in [facility] for energy conservation. Pt vitals assessed at rest in sitting, pt demonstrates BP 119/64, HR 69, SpO2 94%. Pt then ambulates 175' with CGA and no device with one extended standing rest break with BUE support on nurse's station, cues for optimal breathing pattern to promote improved lung expansion and O2 exchange with pt demonstrating understanding but does require cues to limit speaking during ambulation and during standing rest break. Pt provided with education on speaking with energy expenditure, pt verbalizing understanding. Pt reporting increased nausea towards end of gait trial with pt demonstrating several belching episodes. Pt requesting to have nausea medication as soon as possible, RN notified who states all medications are to be IV only and pt requesting IV placement as soon as possible. Pt ambulates back to room ~120', improved breathing pattern with decreased speaking noted, CGA. Upon sitting EOB pt SpO2 noted to be 82%, provided with  cues for deep breathing with pt SpO2 returning to 94% within ~2 min of seated rest.  IV team arrives during session to place IV, session terminated. Pt returns to supine with CGA maintaining sternal precautions and remains with HOB elevated at end of session. Missing 30 minutes of 75 minute session.  Therapist reattempts during scheduled session time however IV team still present, will reattempt as schedule allows.   Therapy Documentation Precautions:  Precautions Precautions: Sternal Precaution Booklet Issued: Yes (comment) Restrictions Weight Bearing Restrictions: No Other Position/Activity Restrictions: sternal precautions  PT Missed Time  PT Amount of Missed Time (min) 30 Minutes  PT Missed Treatment Reason Nursing care (IV placement)     Therapy/Group: Individual Therapy  Edwin Cap PT, DPT 08/26/2023, 12:08 PM

## 2023-08-26 NOTE — Consult Note (Signed)
Medical Consultation   Renee Ho  ZOX:[id number]  DOB: December 08, 1975  DOA: 08/24/2023  PCP: Melida Quitter, PA    Requesting physician: Dr. Shearon Stalls  Reason for consultation: Ileus   History of Present Illness: Renee Ho is an 47 y.o. female CAD status post PCI and now CABG on 08/19/2023, hypertension, hyperlipidemia, obesity who is status post CABG on 08/19/2023 and admitted to CIR on 08/24/2023 started having some nausea on the day of admission, was diagnosed with ileus based on the abdominal x-ray on 08/25/2023.  Started on clear liquid diet.  No different compared to yesterday.  Hospital service were consulted for concern of worsening and thus needing medical comanagement help.  Patient seen and examined by myself.  She tells me that the last bowel movement she had was on October 11 and then when she had after CABG on 08/21/2023, that to was after she was given mag citrate and she has not had any bowel movement since then.  She further tells me that she has been having nausea since admission to the medical unit for CABG and that she has been passing flatus as well.  She has intermittent abdominal cramping which is generalized.  No vomiting, fever, chills, sweating, any problem with urination or with bowel movement or any other complaint.   Review of Systems:  ROS As per HPI otherwise negative  Past Medical History: Past Medical History:  Diagnosis Date   Depression    Diabetes mellitus    Diabetes mellitus without complication (HCC)    Diverticula of intestine    High cholesterol    Hypertension    OSA on CPAP    CPAP pressure= 15   Polycystic ovarian syndrome    Sleep apnea    on CPAP with pressure setting= 15    Past Surgical History: Past Surgical History:  Procedure Laterality Date   ABDOMINAL HYSTERECTOMY     CORONARY ARTERY BYPASS GRAFT N/A 08/19/2023   Procedure: CORONARY ARTERY BYPASS GRAFTING (CABG) X THREE, USING  LEFT INTERNAL MAMMARY ARTERY AND RIGHT GREATER SAPHENEOUS VEIN HARVESTED ENDOSCOPICLY;  Surgeon: [name], MD;  Location: [facility] OR;  Service: Open Heart Surgery;  Laterality: N/A;   CORONARY STENT INTERVENTION N/A 07/23/2023   Procedure: CORONARY STENT INTERVENTION;  Surgeon: Kathleene Hazel, MD;  [facility]: [facility] INV[facility] LAB;  Service: Cardiovascular;  Laterality: N/A;   CYST REMOVAL NECK     KNEE ARTHROSCOPY  07/11/2012   Procedure: ARTHROSCOPY KNEE;  Surgeon: Cammy Copa, MD;  Location: [facility] OR;  Service: Orthopedics;  Laterality: Right;  Right knee arthroscopy with debridement   KNEE SURGERY     LEFT HEART CATH AND CORONARY ANGIOGRAPHY N/A 07/23/2023   Procedure: LEFT HEART CATH AND CORONARY ANGIOGRAPHY;  Surgeon: Kathleene Hazel, MD;  [facility]: [facility] LAB;  Service: Cardiovascular;  Laterality: N/A;   OVARIAN CYST REMOVAL     TEE WITHOUT CARDIOVERSION N/A 08/19/2023   Procedure: TRANSESOPHAGEAL ECHOCARDIOGRAM;  Surgeon: [name], MD;  Location: [facility] OR;  Service: Open Heart Surgery;  Laterality: N/A;   WISDOM TOOTH EXTRACTION  05/11/2017     Allergies:   Allergies  Allergen Reactions   Morphine And Codeine Other (See Comments)    Migraines   Reglan [Metoclopramide] Other (See Comments)    Psychotic episodes   Chantix [Varenicline] Other (See Comments)    Makes the patient angry feeling  Toradol [Ketorolac Tromethamine] Rash     Social History:  reports that she has been smoking cigarettes. She has a 10 pack-year smoking history. She has never used smokeless tobacco. She reports that she does not drink alcohol and does not use drugs.   Family History: Family History  Problem Relation Age of Onset   Cancer Mother        breast   Mental illness Mother    Depression Mother    Hyperlipidemia Mother    Breast cancer Mother    Healthy Father    Hyperlipidemia Father    Diabetes Maternal Grandmother    Hyperlipidemia Maternal  Grandmother    Cancer Maternal Grandfather        unknown type   Depression Maternal Grandfather    Hyperlipidemia Maternal Grandfather    Stroke Paternal Grandmother    Hyperlipidemia Paternal Grandmother    Hyperlipidemia Paternal Grandfather    Sleep apnea Neg Hx      Physical Exam: Vitals:   08/25/23 0500 08/25/23 1324 08/25/23 2010 08/26/23 0500  BP:  119/62 109/61 124/65  Pulse:  82 84 72  Resp:  20 18 16   Temp:  98.3 F (36.8 C) 98.1 F (36.7 C) 98.5 F (36.9 C)  TempSrc:   Oral Oral  SpO2:  93% 90%   Weight: 118.8 kg   122.7 kg  Height:        Constitutional: Alert and awake, oriented x3, not in any acute distress. Eyes: PERLA, EOMI, irises appear normal, anicteric sclera,  ENMT: external ears and nose appear normal,             Lips appears normal, oropharynx mucosa, tongue, posterior pharynx appear normal  Neck: neck appears normal, no masses, normal ROM, no thyromegaly, no JVD  CVS: S1-S2 clear, no murmur rubs or gallops, no LE edema, normal pedal pulses  Respiratory:  clear to auscultation bilaterally, no wheezing, rales or rhonchi. Respiratory effort normal. No accessory muscle use.  Abdomen: soft nontender, nondistended, hyperactive bowel sounds, no hepatosplenomegaly, no hernias  Musculoskeletal: : no cyanosis, clubbing or edema noted bilaterally                        Neuro: Cranial nerves II-XII intact, strength, sensation, reflexes Psych: judgement and insight appear normal, stable mood and affect, mental status Skin: no rashes or lesions or ulcers, no induration or nodules    Data reviewed:  I have personally reviewed following labs and imaging studies Labs:  CBC: Recent Labs  Lab 08/20/23 1106 08/20/23 1711 08/21/23 0205 08/22/23 0320 08/25/23 0815  WBC 14.4* 14.9* 15.6* 10.2 8.3  NEUTROABS  --   --   --   --  5.6  HGB 11.9* 11.0* 10.8* 9.6* 9.8*  HCT 35.2* 33.9* 33.6* 29.4* 29.6*  MCV 91.7 93.9 94.4 91.6 93.7  PLT 151 143* 141* 126* 203     Basic Metabolic Panel: Recent Labs  Lab 08/19/23 1930 08/20/23 0328 08/20/23 1106 08/20/23 1711 08/21/23 0205 08/22/23 0320 08/24/23 0957 08/25/23 0815  NA 138 138  --  133* 134* 135 134* 134*  K 4.3 3.9  --  3.8 4.2 3.7 3.6 3.7  CL 108 107  --  102 100 102 95* 97*  CO2 24 26  --  25 25 26 29 30   GLUCOSE 142* 136*  --  191* 197* 133* 143* 198*  BUN 13 12  --  11 14 14 13 11   CREATININE 1.00 0.94   < >  0.90 0.86 0.84 0.89 0.90  CALCIUM 7.8* 8.2*  --  8.1* 8.3* 8.4* 8.3* 8.3*  MG 2.6* 2.7*  --  2.3  --   --  2.2  --    < > = values in this interval not displayed.   GFR Estimated Creatinine Clearance: 99.4 mL/min (by C-G formula based on SCr of 0.9 mg/dL). Liver Function Tests: Recent Labs  Lab 08/24/23 0957 08/25/23 0815  AST 21 19  ALT 9 10  ALKPHOS 31* 36*  BILITOT 1.1 0.9  PROT 6.0* 5.8*  ALBUMIN 2.9* 2.8*   No results for input(s): "LIPASE", "AMYLASE" in the last 168 hours. No results for input(s): "AMMONIA" in the last 168 hours. Coagulation profile Recent Labs  Lab 08/19/23 1339  INR 1.3*    Cardiac Enzymes: No results for input(s): "CKTOTAL", "CKMB", "CKMBINDEX", "TROPONINI" in the last 168 hours. BNP: Invalid input(s): "POCBNP" CBG: Recent Labs  Lab 08/25/23 1118 08/25/23 1653 08/25/23 2038 08/26/23 0532 08/26/23 1136  GLUCAP 180* 145* 130* 123* 125*   D-Dimer No results for input(s): "DDIMER" in the last 72 hours. Hgb A1c No results for input(s): "HGBA1C" in the last 72 hours. Lipid Profile No results for input(s): "CHOL", "HDL", "LDLCALC", "TRIG", "CHOLHDL", "LDLDIRECT" in the last 72 hours. Thyroid function studies Recent Labs    08/24/23 0957  TSH 2.174   Anemia work up No results for input(s): "VITAMINB12", "FOLATE", "FERRITIN", "TIBC", "IRON", "RETICCTPCT" in the last 72 hours. Urinalysis    Component Value Date/Time   COLORURINE YELLOW 08/18/2023 0815   APPEARANCEUR CLEAR 08/18/2023 0815   LABSPEC 1.010 08/18/2023 0815    PHURINE 5.0 08/18/2023 0815   GLUCOSEU 150 (A) 08/18/2023 0815   HGBUR NEGATIVE 08/18/2023 0815   BILIRUBINUR NEGATIVE 08/18/2023 0815   BILIRUBINUR negative 03/13/2019 1433   BILIRUBINUR Negative 05/22/2013 1647   KETONESUR NEGATIVE 08/18/2023 0815   PROTEINUR NEGATIVE 08/18/2023 0815   UROBILINOGEN 0.2 03/13/2019 1433   UROBILINOGEN 0.2 03/09/2019 1237   NITRITE NEGATIVE 08/18/2023 0815   LEUKOCYTESUR NEGATIVE 08/18/2023 0815     Microbiology Recent Results (from the past 240 hour(s))  Surgical pcr screen     Status: Abnormal   Collection Time: 08/18/23  3:47 AM   Specimen: Nasal Mucosa; Nasal Swab  Result Value Ref Range Status   MRSA, PCR NEGATIVE NEGATIVE Final   Staphylococcus aureus POSITIVE (A) NEGATIVE Final    Comment: (NOTE) The Xpert SA Assay (FDA approved for NASAL specimens in patients 21 years of age and older), is one component of a comprehensive surveillance program. It is not intended to diagnose infection nor to guide or monitor treatment. Performed at [facility] Lab, [address], [place], [place] [address]   SARS Coronavirus 2 by RT PCR (hospital order, performed in [facility] hospital lab) *cepheid single result test* Anterior Nasal Swab     Status: None   Collection Time: 08/18/23  2:40 PM   Specimen: Anterior Nasal Swab  Result Value Ref Range Status   SARS Coronavirus 2 by RT PCR NEGATIVE NEGATIVE Final    Comment: Performed at [facility], [address], [place], [place] [address]       Inpatient Medications:   Scheduled Meds:  acetaminophen  1,000 mg Oral TID   amiodarone  400 mg Oral BID   Followed by   Melene Muller ON 08/30/2023] amiodarone  200 mg Oral BID   Followed by   Melene Muller ON 09/07/2023] amiodarone  200 mg Oral Daily  aspirin EC  81 mg Oral Daily   atorvastatin  80 mg Oral Daily   bisacodyl  10 mg Oral Daily   bisacodyl  10 mg Rectal Once   Or   docusate sodium  1 enema Rectal Once   clopidogrel  75 mg Oral Daily    enoxaparin (LOVENOX) injection  60 mg Subcutaneous QHS   [START ON 08/27/2023] furosemide  20 mg Intravenous Daily   insulin aspart  0-24 Units Subcutaneous TID WC   lidocaine  1 patch Transdermal Q24H   [START ON 08/27/2023] pantoprazole (PROTONIX) IV  40 mg Intravenous Q24H   polyethylene glycol  17 g Oral BID   senna-docusate  1 tablet Oral BID   simethicone  80 mg Oral QID   Continuous Infusions:  lactated ringers 50 mL/hr at 08/26/23 1308     Radiological Exams on Admission: DG Abd 2 Views  Result Date: 08/25/2023 CLINICAL DATA:  Nausea vomiting EXAM: ABDOMEN - 2 VIEW COMPARISON:  CT 06/30/2023 FINDINGS: Lower sternotomy. Mild diffuse air distension of large and small bowel. Gas seen down to the pelvis. No radiopaque calculi IMPRESSION: Mild diffuse air distension of large and small bowel, favor ileus. Electronically Signed   By: Jasmine Pang M.D.   On: 08/25/2023 21:02    Impression/Recommendations Principal Problem:   Debility Active Problems:   Ileus (HCC)  Ileus: Based on the history and examination, I am not impressed that this is 2 L.  This is more likely constipation.  Patient already has Dulcolax suppository ordered by primary attending which I agree with.  I agree with continuing clear liquid diet and monitor with supportive care and symptomatic treatment further 24 hours.  Repeat abdominal x-ray in the morning.  History of CAD post CABG: Doing well.  She has been followed by Dr. Dorris Fetch himself.  Rest of the medical issues are stable and per primary attending.  Thank you for this consultation.  Our [facility] hospitalist team will follow the patient with you.   Time Spent: 40 minutes  Hughie Closs M.D. [facility] 08/26/2023, 1:28 PM *Please note that this is a verbal dictation therefore any spelling or grammatical errors are due to the "Dragon [employer] One" system interpretation. Please page via Amion and do not message via secure chat for urgent  patient care matters. Secure chat can be used for non urgent patient care matters. How to contact the [facility] Attending or Consulting provider 7A - 7P or covering provider during after hours 7P -7A, for this patient?  Check the care team in [facility] and look for a) attending/consulting [facility] provider listed and b) the [facility] team listed. Page or secure chat 7A-7P. Log into [email or web address] and use [place]'s universal password to access. If you do not have the password, please contact the hospital operator. Locate the [facility] provider you are looking for under [facility] and page to a number that you can be directly reached. If you still have difficulty reaching the provider, please page the [facility] (Director on Call) for the Hospitalists listed on amion for assistance.

## 2023-08-26 NOTE — Progress Notes (Addendum)
PROGRESS NOTE   Subjective/Complaints:  Per documentation overnihgt: "Ongoing nausea this afternoon with complaints of abdominal pain due to distention. She is passing flatus but has not had a bowel movement today. She has been dosed with Senokot, MiraLAX and lactulose. Pain films of the abdomen obtained today. 2 view ordered minimally KUB performed. Reading is pending. To my review she has dilated small bowel loops and no gas in the rectum. Will place her on clear liquid diet tonight and schedule simethicone 4 times daily. "  KUB favoring ileus; no BM overnight. Vitals stable, eating 50% meals.  She states she is having a lot of pain in her abdomen, as well is over hematoma site on her right proximal thigh.  Has been passing some minimal gas.  Agreeable to enema today, as well as placement of an IV and transition of some medications IV form.  Did talk about how we will have to back off from pain regimen a little bit, patient is okay with this as long as she has a as needed available if needed.  She states her pain is often worse at night.  ROS: + Chest pain -improved with patch, + R thigh pain - worse, + difficulty sleeping - due to pain, + nausea / poor appetite - ongoing/worsening, +constipation - ongoing, + Edema - ongoing. Denies fevers, chills, diarrhea, SOB, cough, new weakness or paraesthesias.    Objective:   DG Abd 2 Views  Result Date: 08/25/2023 CLINICAL DATA:  Nausea vomiting EXAM: ABDOMEN - 2 VIEW COMPARISON:  CT 06/30/2023 FINDINGS: Lower sternotomy. Mild diffuse air distension of large and small bowel. Gas seen down to the pelvis. No radiopaque calculi IMPRESSION: Mild diffuse air distension of large and small bowel, favor ileus. Electronically Signed   By: Jasmine Pang M.D.   On: 08/25/2023 21:02   Recent Labs    08/25/23 0815  WBC 8.3  HGB 9.8*  HCT 29.6*  PLT 203   Recent Labs    08/24/23 0957 08/25/23 0815  NA  134* 134*  K 3.6 3.7  CL 95* 97*  CO2 29 30  GLUCOSE 143* 198*  BUN 13 11  CREATININE 0.89 0.90  CALCIUM 8.3* 8.3*    Intake/Output Summary (Last 24 hours) at 08/26/2023 0925 Last data filed at 08/25/2023 1250 Gross per 24 hour  Intake 240 ml  Output --  Net 240 ml        Physical Exam: Vital Signs Blood pressure 124/65, pulse 72, temperature 98.5 F (36.9 C), temperature source Oral, resp. rate 16, height 5\' 5"  (1.651 m), weight 122.7 kg, SpO2 90%.  General: No apparent distress.  Sitting upright in wheelchair at nurses station. +obese HEENT: Head is normocephalic, atraumatic.  No JVD.   Heart: Reg rate and rhythm. No murmurs rubs or gallops.  Peripheral pulses 2+. + Bilateral edema at the calves and thighs, nonpitting, mildly increased from prior. +TEDs Chest: CTA bilaterally without wheezes, rales, or rhonchi; no distress.  On room air. Abdomen: Diffusely tender, mildly-distended, bowel sounds positive but hypoactive.   Psych: Appropriate mood and affect Skin:  Sternal incision midline CDI, well-approximated with mild erythema around borders Drain sutures in place  Right leg donor site  - covered in mepilex dressing Abdominal incisions with sutures remaining, CDI Bruising on R medial thigh - slightly tense, TTP, no warmth or change in contour         Neuro: Alert and oriented x 4, apparent deficits Strength tested limited by sternal precautions in upper extremitie; antigravity and against resistance bilaterally Bilateral lower extremity strength 4+ out of 5 Decreased sensation over lateral left shoulder Altered sensation digits 2 through 5 on the left hand and digits 2 through 4 on the right hand  Musculoskeletal: Right lower extremity at proximal thigh and chest wall tenderness present + Decreased range of motion left shoulder, less than 90 degrees abduction and flexion  Assessment/Plan: 1. Functional deficits which require 3+ hours per day of  interdisciplinary therapy in a comprehensive inpatient rehab setting. Physiatrist is providing close team supervision and 24 hour management of active medical problems listed below. Physiatrist and rehab team continue to assess barriers to discharge/monitor patient progress toward functional and medical goals  Care Tool:  Bathing    Body parts bathed by patient: Abdomen, Chest, Left arm, Right arm, Buttocks, Front perineal area, Right upper leg, Left upper leg, Left lower leg, Face, Right lower leg         Bathing assist Assist Level: Contact Guard/Touching assist     Upper Body Dressing/Undressing Upper body dressing   What is the patient wearing?: Pull over shirt    Upper body assist Assist Level: Contact Guard/Touching assist    Lower Body Dressing/Undressing Lower body dressing      What is the patient wearing?: Pants, Underwear/pull up     Lower body assist Assist for lower body dressing: Minimal Assistance - Patient > 75%     Toileting Toileting    Toileting assist Assist for toileting: Contact Guard/Touching assist     Transfers Chair/bed transfer  Transfers assist     Chair/bed transfer assist level: Contact Guard/Touching assist     Locomotion Ambulation   Ambulation assist      Assist level: Contact Guard/Touching assist Assistive device: Rollator Max distance: 175'   Walk 10 feet activity   Assist     Assist level: Contact Guard/Touching assist Assistive device: Rollator   Walk 50 feet activity   Assist    Assist level: Contact Guard/Touching assist Assistive device: Rollator    Walk 150 feet activity   Assist    Assist level: Contact Guard/Touching assist Assistive device: Rollator    Walk 10 feet on uneven surface  activity   Assist Walk 10 feet on uneven surfaces activity did not occur: Safety/medical concerns         Wheelchair     Assist Is the patient using a wheelchair?: Yes Type of Wheelchair:  Manual    Wheelchair assist level: Dependent - Patient 0%      Wheelchair 50 feet with 2 turns activity    Assist        Assist Level: Dependent - Patient 0%   Wheelchair 150 feet activity     Assist      Assist Level: Dependent - Patient 0%   Blood pressure 124/65, pulse 72, temperature 98.5 F (36.9 C), temperature source Oral, resp. rate 16, height 5\' 5"  (1.651 m), weight 122.7 kg, SpO2 90%.  Medical Problem List and Plan: 1. 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 sternal precautions             -patient may shower, cover  incisions please             -ELOS/Goals: 7 to 10 days, mod I to supervision with PT and OT             -Admit to CIR  11/15 - Consult hospitalist for assistance in medical management over the weekend, given tenuous volume status and management of current ileus   2.  Antithrombotics: -DVT/anticoagulation:  Pharmaceutical: Lovenox 60 mg q HS             -antiplatelet therapy: Aspirin and Plavix    3. Pain Management: Tylenol, oxycodone, tramadol as needed   - Add Tylenol 1000 mg scheduled, robaxin 750 mg TID   - Add lido patch over upper sternum for localized pain, as incision currently well approximated without drainage  11/15: Dc oxycodone, DC robaxin, increase tramadol to 50-100 mg Q6H PRN, add dilaudid 0.5 mg once daily PRN IV for breakthrough to limit pill and opiate burden --> resume prior regimen    4. Mood/Behavior/Sleep: LCSW to evaluate and provide emotional support             -continue Cymbalta 90 mg daily             -continue melatonin 3 mg q HS --> increase to 5 mg scheduled 2000, and add trazodone 50 mg PRN             -antipsychotic agents: n/a  11/15: DC melatonin and trazodone to reduce pill burden --> resumed with melatonin 5 mg scheduled; trazodone 75 mg PRN    5. Neuropsych/cognition: This patient is capable of making decisions on her own behalf.   6. Skin/Wound Care: Routine  skin care checks   - Sternal incision healing well   - R groin vein harvesting site edematous but closed, without drainage -- hematoma tense but stable   - Bilateral drain sutures intact - CTS APP to remove on 11/18   7. Fluids/Electrolytes/Nutrition: Routine Is and Os and follow-up chemistries             -continue Klor-con 20 mEq daily and follow-up BMP   - 11/15: BMP today   8: Hypertension/CHF: monitor TID and prn             -Zestril, Lopressor held due to hypotension             -Lasix 40 mg daily resumed 11/13 (on Klor-con)             -BP well-controlled, weights stable   - 11/14: Add TED hose for edema   - 11/15: BMP today with extra 20 mg PO dose for weight gain / volume increase if looks OK --> labs reviewed, 1x 20 mg PO lasix given for edema - IM consult to assist in management; appreciate their insight and recommendations      08/26/2023    5:00 AM 08/25/2023    8:10 PM 08/25/2023    1:24 PM  Vitals with BMI  Weight 270 lbs 8 oz    BMI 45.01    Systolic 124 109 161  Diastolic 65 61 62  Pulse 72 84 82   Filed Weights   08/24/23 1825 08/25/23 0500 08/26/23 0500  Weight: 117.9 kg 118.8 kg 122.7 kg    9: Hyperlipidemia: continue statin, Zetia, Vascepa   10: Episode of SVT: started amiodarone 400 mg BID on 11/12 for one week, then 200 mg BID for one week then 200 mg daily             -  follow-up with Dr. Odis Hollingshead    - 11-14: Minimal bradycardia overnight, asymptomatic, monitor   - 11/15: HR stable  11: DM-2: CBGs QID; A1c = 6.5% (at home on Farxiga, [place], metformin and Mounjaro)             -continue SSI  -11-14 blood sugar slightly elevated, creatinine within normal limits, resume home metformin 1000 mg twice daily.   - 11/15: improved, monitor    Recent Labs    08/25/23 1653 08/25/23 2038 08/26/23 0532  GLUCAP 145* 130* 123*     12: Thrush: continue Nystatin  -Not noted on exam 11-14; Nystatin Dced 11/15   13: OSA-on CPAP requiring supplemental  O2  -On room air 11-14   - ISB Q4H for pulmonary recovery   14: ABLA with chest hematoma: follow-up CBC.  Hemoglobin 9.6 on 11/11   -9.8 on admission labs, monitor q. Weekly  15: Morbid Obesity BMI 43             -Dietary counseling   16: Left shoulder numbness and weakness.  Suspect she may have had axillary nerve injury.  Continue to monitor for now   17. Constipation/ileus - LBM 11/12 with mag citrate   - DC colace, add sennakot-S 26m tab BID to miralax BID; keep docusate   - Add PRN lactulose 20 gm (wish to stay away from magnesium containing laxatives given cardiac issues)  11/15: Ileus overnight on KUB as above. Worsening symptoms despite clear liquid diet. Has PRN IV zofran. Unfortunately allergic to reglan. Start gentle IVF LR 50 cc/hr, convert to IV or DC meds as possible, Add enemeez vs. Suppository today and monitor closely this weekend; appreciate IM assistance in management - **Addendum: Hospitalist evaluated, was not impressed with ileus, discussed and agree with DC IVF given edema and reversal of medications to prior PO regimen; enema tonight. They will continue to follow this weekend.**  LOS: 2 days A FACE TO FACE EVALUATION WAS PERFORMED  Angelina Sheriff 08/26/2023, 9:25 AM

## 2023-08-26 NOTE — Progress Notes (Signed)
      [address]       [place] [address]             [phone]      Social visit as she is working with PT currently  Looks great  No complaints  Salvatore Decent. Dorris Fetch, MD [facility] [phone]

## 2023-08-26 NOTE — Plan of Care (Signed)
  Problem: Consults Goal: RH GENERAL PATIENT EDUCATION Description: See Patient Education module for education specifics. Outcome: Progressing   Problem: RH BOWEL ELIMINATION Goal: RH STG MANAGE BOWEL WITH ASSISTANCE Description: STG Manage Bowel with min Assistance. Outcome: Progressing Goal: RH STG MANAGE BOWEL W/MEDICATION W/ASSISTANCE Description: STG Manage Bowel with Medication with min Assistance. Outcome: Progressing   Problem: RH BLADDER ELIMINATION Goal: RH STG MANAGE BLADDER WITH ASSISTANCE Description: STG Manage Bladder With min Assistance Outcome: Progressing   Problem: RH SKIN INTEGRITY Goal: RH STG SKIN FREE OF INFECTION/BREAKDOWN Description: Incisions will continue to heal and be free of infection with min assist  Outcome: Progressing Goal: RH STG ABLE TO PERFORM INCISION/WOUND CARE W/ASSISTANCE Description: STG Able To Perform Incision/Wound Care With min Assistance. Outcome: Progressing   Problem: RH SAFETY Goal: RH STG ADHERE TO SAFETY PRECAUTIONS W/ASSISTANCE/DEVICE Description: STG Adhere to Safety Precautions With cueing Assistance/Device. Outcome: Progressing Goal: RH STG DECREASED RISK OF FALL WITH ASSISTANCE Description: STG Decreased Risk of Fall With min Assistance. Outcome: Progressing   Problem: RH PAIN MANAGEMENT Goal: RH STG PAIN MANAGED AT OR BELOW PT'S PAIN GOAL Description: Less than 4 with use of PRN medications min assist  Outcome: Progressing   Problem: RH KNOWLEDGE DEFICIT GENERAL Goal: RH STG INCREASE KNOWLEDGE OF SELF CARE AFTER HOSPITALIZATION Description: Patient will be able to manage new medications, self care, and diet/lifestyle modifications to improve cholesterol levels, improve A1C, and overall health from nursing education and nursing handouts independently  Outcome: Progressing

## 2023-08-26 NOTE — Progress Notes (Signed)
   08/25/23 2225  BiPAP/CPAP/SIPAP  $ Non-Invasive Home Ventilator  Subsequent  BiPAP/CPAP/SIPAP Pt Type Adult  BiPAP/CPAP/SIPAP Resmed  Mask Type Full face mask  Respiratory Rate 16 breaths/min  Pressure Support 10 cmH20  PEEP 5 cmH20  Flow Rate 4 lpm  Auto Titrate Yes

## 2023-08-26 NOTE — Progress Notes (Signed)
Pt. States she can place herself on cpap when ready.

## 2023-08-26 NOTE — Care Management (Signed)
[facility]patient [facility] Individual Statement of Services  Patient Name:  Christel Bielefeldt  Date:  08/26/2023  Welcome to the [facility]patient [facility].  Our goal is to provide you with an individualized program based on your diagnosis and situation, designed to meet your specific needs.  With this comprehensive rehabilitation program, you will be expected to participate in at least 3 hours of rehabilitation therapies Monday-Friday, with modified therapy programming on the weekends.  Your rehabilitation program will include the following services:  Physical Therapy (PT), Occupational Therapy (OT), 24 hour per day rehabilitation nursing, Therapeutic Recreaction (TR), Psychology, Neuropsychology, Care Coordinator, Rehabilitation Medicine, Nutrition Services, Pharmacy Services, and Other  Weekly team conferences will be held on Tuesdays to discuss your progress.  Your Inpatient Rehabilitation Care Coordinator will talk with you frequently to get your input and to update you on team discussions.  Team conferences with you and your family in attendance may also be held.  Expected length of stay: 7-10 days    Overall anticipated outcome: Independent  Depending on your progress and recovery, your program may change. Your Inpatient Rehabilitation Care Coordinator will coordinate services and will keep you informed of any changes. Your Inpatient Rehabilitation Care Coordinator's name and contact numbers are listed  below.  The following services may also be recommended but are not provided by the Inpatient [facility]:  Driving Evaluations Home Health Rehabiltiation Services Outpatient Rehabilitation Services Vocational Rehabilitation   Arrangements will be made to provide these services after discharge if needed.  Arrangements include referral to agencies that provide these services.  Your insurance has been verified to be:  [employer]  Your primary  doctor is:  Saralyn Pilar  Pertinent information will be shared with your doctor and your insurance company.  Inpatient Rehabilitation Care Coordinator:  Susie Cassette [phone] or [phone]  Information discussed with and copy given to patient by: Gretchen Short, 08/26/2023, 9:25 AM

## 2023-08-26 NOTE — Progress Notes (Signed)
Occupational Therapy Session Note  Patient Details  Name: Renee Ho MRN: [id number] Date of Birth: [birth year]  Today's Date: 08/26/2023 Session 1 OT Individual Time: 0850-1002 OT Individual Time Calculation (min): 72 min   Session 2 OT Individual Time: 1400-1445 OT Individual Time Calculation (min): 45 min    Short Term Goals: Week 1:  OT Short Term Goal 1 (Week 1): LTG=STG 2.2 ELOS  Skilled Therapeutic Interventions/Progress Updates:  Session 1   Pt greeted seated EOB with nursing present and agreeable to OT treatment session. Pt with some more swelling and bruising from R inner thigh, then with drainage when she pressed around bruising from incision down her leg. Nursing and PA notified. Pt wante dto shower today. Functional ambulation in room without AD and CGA. SpO2 in on room air 92%. Shower completed without O2 with CGA overall. SpO2 checked after shower at 91%. Dressing tasks completed from wc at the sink with education provided for use of friction reducing bag to don TED hose. Pt able to don L sock, but OT assist for R side due to R leg pain. Functional ambulation in hallway with SpO2 down to 85% on room air. Pt recovered to 91% after 2 minute standing rest break. Pt left seated in wc at nurses station with alarm on, call bell in reach, and needs met.  Pain:  Pt reports 6/10 pain chest, rest and repositioned  Session 2 Pt greeted with husband at nurses station in her wc. Pt brought to therapy gym and completed stand-pivot to therapy mat. SpO2 94% on room air without activity. Addressed sit<>stands, balance, and activity tolerance with 10 sit<>stands on foam block. Pt's SpO2 maintained at 90% on first set. Then on second set, SpO2 dropped to 85% and needed seated rest break to recover. OT obtained portable O2, but pt wanted to try one more set without O2. Focus on deep breathing techniques and pt able to maintain sats abouve 91% on room air during activity. Functional  ambulation 60 feet without device and CGA  on RA. Pt reported max fatigue and did not feel like she could participate further in session. Pt ambulated back to room in similar fashion and was left seated in wc with spouse present, alarm on, call bell in reach, and needs met.   Therapy Documentation Precautions:  Precautions Precautions: Sternal Precaution Booklet Issued: Yes (comment) Restrictions Weight Bearing Restrictions: No Other Position/Activity Restrictions: sternal precautions General: General OT Amount of Missed Time: 15 Minutes PT Missed Treatment Reason: Fatigue  Pain:  Pt reports some chest pain at incision, but no number given. Rest and repositioned   Therapy/Group: Individual Therapy  [name] 08/26/2023, 2:55 PM

## 2023-08-27 ENCOUNTER — Inpatient Hospital Stay (HOSPITAL_COMMUNITY): Payer: 59

## 2023-08-27 DIAGNOSIS — S4400XD Injury of ulnar nerve at upper arm level, unspecified arm, subsequent encounter: Secondary | ICD-10-CM | POA: Diagnosis not present

## 2023-08-27 DIAGNOSIS — K560 Paralytic ileus: Secondary | ICD-10-CM | POA: Diagnosis not present

## 2023-08-27 DIAGNOSIS — R5381 Other malaise: Secondary | ICD-10-CM | POA: Diagnosis not present

## 2023-08-27 DIAGNOSIS — D62 Acute posthemorrhagic anemia: Secondary | ICD-10-CM | POA: Diagnosis not present

## 2023-08-27 LAB — GLUCOSE, CAPILLARY
Glucose-Capillary: 122 mg/dL — ABNORMAL HIGH (ref 70–99)
Glucose-Capillary: 115 mg/dL — ABNORMAL HIGH (ref 70–99)
Glucose-Capillary: 134 mg/dL — ABNORMAL HIGH (ref 70–99)
Glucose-Capillary: 142 mg/dL — ABNORMAL HIGH (ref 70–99)

## 2023-08-27 MED ORDER — FUROSEMIDE 10 MG/ML IJ SOLN
40.0000 mg | Freq: Two times a day (BID) | INTRAMUSCULAR | Status: AC
Start: 2023-08-27 — End: 2023-08-28
  Administered 2023-08-27 – 2023-08-28 (×4): 40 mg via INTRAVENOUS
  Filled 2023-08-27 (×4): qty 4

## 2023-08-27 MED ORDER — HYDROCORTISONE ACETATE 25 MG RE SUPP: 25.0000 mg | Freq: Two times a day (BID) | RECTAL | Status: DC | PRN

## 2023-08-27 MED ORDER — SENNOSIDES-DOCUSATE SODIUM 8.6-50 MG PO TABS
2.0000 | ORAL_TABLET | Freq: Two times a day (BID) | ORAL | Status: DC
  Administered 2023-08-28: 2 via ORAL
  Filled 2023-08-27 (×4): qty 2

## 2023-08-27 MED ORDER — SMOG ENEMA
960.0000 mL | Freq: Once | RECTAL | Status: AC
  Administered 2023-08-27: 960 mL via RECTAL
  Filled 2023-08-27: qty 960

## 2023-08-27 NOTE — Progress Notes (Signed)
PROGRESS NOTE  Renee Ho  WUJ:[id number] DOB: February 04, 1976 DOA: 08/24/2023 PCP: Melida Quitter, PA   Brief Narrative: Patient is a 47 year old female with history of coronary disease status post PCI, hypertension, hyperlipidemia, obesity who recently had CABG on 08/19/2023 and was transferred to CIR on 08/24/2023 started having nausea, found to have ileus as per abdominal x-ray on 11/14.  We were consulted for concern of worsening ileus.  Last bowel movement was on October 11.  No vomiting but she complained of intermittent abdominal cramping.  Started on aggressive bowel regimen.  Assessment & Plan:  Principal Problem:   Debility Active Problems:   Ileus (HCC)   Ileus: Complaint of nausea, abdominal cramps.  Abdominal x-ray suspicious for ileus.  Passing gas.  Constipation suspected.  She has good bowel sounds today.  Abdomen is nontender.  Continue aggressive bowel regimen.  SMOG enema ordered.  Bilateral lower extremity edema/basilar crackles, acute on chronic diastolic CHF exacerbation:  On room air.  Appears severely volume overloaded .  Lasix changed  to IV.  Monitor input/output.  Check BMP tomorrow.  Recent echo shows EF of 60-65%, grade 1 diastolic dysfunction.  Coronary artery disease: Had CABG done on 08/19/2023.  Cardiothoracic surgery following.  On aspirin and Plavix, ezetimibe, fenofibrate  Hypertension: Currently blood pressure stable  Hyperlipidemia: On ezetimibe, fenofibrate  Diabetes type 2: On metformin, sliding scale.  Monitor blood sugars.  Morbid obesity: BMI 45          DVT prophylaxis:Place TED hose Start: 08/25/23 1020     Code Status: Full Code   Antimicrobials:  Anti-infectives (From admission, onward)    None       Subjective: Patient seen and examined the bedside today.  Hemodynamically stable.  Appears comfortable.  Sitting on the edge of the bed, talking on phone.  Denies any nausea or vomiting.  Complains of some  abdominal discomfort and distention.  Found to have good bowel sounds.  Volume overloaded on examination.  Objective: Vitals:   08/26/23 0500 08/26/23 1341 08/26/23 1945 08/27/23 0300  BP: 124/65 (!) 122/56 113/65 134/68  Pulse: 72 85 74 77  Resp: 16 18 18 18   Temp: 98.5 F (36.9 C) 97.6 F (36.4 C) 98.4 F (36.9 C) 97.9 F (36.6 C)  TempSrc: Oral   Oral  SpO2:  95% 91% 95%  Weight: 122.7 kg     Height:        Intake/Output Summary (Last 24 hours) at 08/27/2023 0806 Last data filed at 08/26/2023 1800 Gross per 24 hour  Intake 240 ml  Output --  Net 240 ml   Filed Weights   08/24/23 1825 08/25/23 0500 08/26/23 0500  Weight: 117.9 kg 118.8 kg 122.7 kg    Examination:  General exam: Overall comfortable, not in distress, morbidly obese HEENT: PERRL Respiratory system: Bilateral  Basal crackles Cardiovascular system: S1 & S2 heard, RRR.  Gastrointestinal system: Abdomen is mildly distended, soft and nontender.  Good bowel sounds Central nervous system: Alert and oriented Extremities: Bilateral lower extremity pitting edema, no clubbing ,no cyanosis Skin: No rashes, no ulcers,no icterus     Data Reviewed: I have personally reviewed following labs and imaging studies  CBC: Recent Labs  Lab 08/20/23 1106 08/20/23 1711 08/21/23 0205 08/22/23 0320 08/25/23 0815  WBC 14.4* 14.9* 15.6* 10.2 8.3  NEUTROABS  --   --   --   --  5.6  HGB 11.9* 11.0* 10.8* 9.6* 9.8*  HCT 35.2* 33.9* 33.6* 29.4* 29.6*  MCV 91.7 93.9 94.4 91.6 93.7  PLT 151 143* 141* 126* 203   Basic Metabolic Panel: Recent Labs  Lab 08/20/23 1711 08/21/23 0205 08/22/23 0320 08/24/23 0957 08/25/23 0815 08/26/23 1145  NA 133* 134* 135 134* 134* 135  K 3.8 4.2 3.7 3.6 3.7 3.9  CL 102 100 102 95* 97* 96*  CO2 25 25 26 29 30 27   GLUCOSE 191* 197* 133* 143* 198* 138*  BUN 11 14 14 13 11 11   CREATININE 0.90 0.86 0.84 0.89 0.90 0.98  CALCIUM 8.1* 8.3* 8.4* 8.3* 8.3* 8.8*  MG 2.3  --   --  2.2  --    --      Recent Results (from the past 240 hour(s))  Surgical pcr screen     Status: Abnormal   Collection Time: 08/18/23  3:47 AM   Specimen: Nasal Mucosa; Nasal Swab  Result Value Ref Range Status   MRSA, PCR NEGATIVE NEGATIVE Final   Staphylococcus aureus POSITIVE (A) NEGATIVE Final    Comment: (NOTE) The Xpert SA Assay (FDA approved for NASAL specimens in patients 75 years of age and older), is one component of a comprehensive surveillance program. It is not intended to diagnose infection nor to guide or monitor treatment. Performed at [facility], [address] [place], [place] [address]   SARS Coronavirus 2 by RT PCR (hospital order, performed in [facility] hospital lab) *cepheid single result test* Anterior Nasal Swab     Status: None   Collection Time: 08/18/23  2:40 PM   Specimen: Anterior Nasal Swab  Result Value Ref Range Status   SARS Coronavirus 2 by RT PCR NEGATIVE NEGATIVE Final    Comment: Performed at [facility], [address], [place], [place] [address]     Radiology Studies: DG Abd 2 Views  Result Date: 08/25/2023 CLINICAL DATA:  Nausea vomiting EXAM: ABDOMEN - 2 VIEW COMPARISON:  CT 06/30/2023 FINDINGS: Lower sternotomy. Mild diffuse air distension of large and small bowel. Gas seen down to the pelvis. No radiopaque calculi IMPRESSION: Mild diffuse air distension of large and small bowel, favor ileus. Electronically Signed   By: Jasmine Pang M.D.   On: 08/25/2023 21:02    Scheduled Meds:  acetaminophen  1,000 mg Oral TID   amiodarone  400 mg Oral BID   Followed by   Melene Muller ON 08/30/2023] amiodarone  200 mg Oral BID   Followed by   Melene Muller ON 09/07/2023] amiodarone  200 mg Oral Daily   aspirin EC  81 mg Oral Daily   atorvastatin  80 mg Oral Daily   bisacodyl  10 mg Oral Daily   clopidogrel  75 mg Oral Daily   DULoxetine  90 mg Oral Daily   enoxaparin (LOVENOX) injection  60 mg Subcutaneous QHS   ezetimibe  10 mg Oral Daily    fenofibrate  160 mg Oral Daily   furosemide  40 mg Oral Daily   icosapent Ethyl  2 g Oral BID   insulin aspart  0-24 Units Subcutaneous TID WC   lidocaine  1 patch Transdermal Q24H   melatonin  5 mg Oral QHS   metFORMIN  1,000 mg Oral BID WC   methocarbamol  750 mg Oral TID   pantoprazole  40 mg Oral Daily   polyethylene glycol  17 g Oral BID   potassium chloride  20 mEq Oral Daily   senna-docusate  1 tablet Oral BID   simethicone  80 mg Oral QID  Continuous Infusions:   LOS: 3 days   Burnadette Pop, MD [facility] P11/16/2024, 8:06 AM

## 2023-08-27 NOTE — Progress Notes (Addendum)
PROGRESS NOTE   Subjective/Complaints:  Pt sitting up at EOB. Says she feels better today.   Did not move bowels yesterday. Is due for SMOG enema this morning. Fingers are feeling better. Shoulders still sore. Chest discomfort ongoing  ROS: Patient denies fever, rash, sore throat, blurred vision, dizziness,  vomiting, diarrhea, cough,   joint or back/neck pain, headache, or mood change.     Objective:   DG Abd 2 Views  Result Date: 08/25/2023 CLINICAL DATA:  Nausea vomiting EXAM: ABDOMEN - 2 VIEW COMPARISON:  CT 06/30/2023 FINDINGS: Lower sternotomy. Mild diffuse air distension of large and small bowel. Gas seen down to the pelvis. No radiopaque calculi IMPRESSION: Mild diffuse air distension of large and small bowel, favor ileus. Electronically Signed   By: Jasmine Pang M.D.   On: 08/25/2023 21:02   Recent Labs    08/25/23 0815  WBC 8.3  HGB 9.8*  HCT 29.6*  PLT 203   Recent Labs    08/25/23 0815 08/26/23 1145  NA 134* 135  K 3.7 3.9  CL 97* 96*  CO2 30 27  GLUCOSE 198* 138*  BUN 11 11  CREATININE 0.90 0.98  CALCIUM 8.3* 8.8*    Intake/Output Summary (Last 24 hours) at 08/27/2023 1610 Last data filed at 08/27/2023 0700 Gross per 24 hour  Intake 480 ml  Output --  Net 480 ml        Physical Exam: Vital Signs Blood pressure 134/68, pulse 77, temperature 97.9 F (36.6 C), temperature source Oral, resp. rate 18, height 5\' 5"  (1.651 m), weight 122.7 kg, SpO2 95%.  Constitutional: No distress . Vital signs reviewed. HEENT: NCAT, EOMI, oral membranes moist Neck: supple Cardiovascular: RRR without murmur. No JVD    Respiratory/Chest: CTA Bilaterally without wheezes or rales. Normal effort    GI/Abdomen: BS +, non-tender, non-distended Ext: no clubbing, cyanosis,LE edema 1+.  Psych: pleasant and cooperative  Skin:  Sternal incision midline CDI, well-approximated with mild erythema still around  borders Drain sutures remain in place Right leg donor site  - covered in mepilex dressing Abdominal incisions with sutures remaining, CDI Bruising on R medial thigh - slightly tense, TTP, no warmth or change in contour         Neuro: Alert and oriented x 4, apparent deficits Strength tested limited by sternal precautions in upper extremitie; antigravity and against resistance bilaterally Bilateral lower extremity strength 4+ out of 5 Decreased sensation over lateral left shoulder Altered sensation digits 2 through 5 on the left hand and digits 2 through 4 on the right hand  Musculoskeletal: Right lower extremity at proximal thigh and chest wall tenderness present +Continue impairments in range of motion left shoulder, less than 90 degrees abduction and flexion  Assessment/Plan: 1. Functional deficits which require 3+ hours per day of interdisciplinary therapy in a comprehensive inpatient rehab setting. Physiatrist is providing close team supervision and 24 hour management of active medical problems listed below. Physiatrist and rehab team continue to assess barriers to discharge/monitor patient progress toward functional and medical goals  Care Tool:  Bathing    Body parts bathed by patient: Abdomen, Chest, Left arm, Right arm, Buttocks, Front perineal area,  Right upper leg, Left upper leg, Left lower leg, Face, Right lower leg         Bathing assist Assist Level: Contact Guard/Touching assist     Upper Body Dressing/Undressing Upper body dressing   What is the patient wearing?: Pull over shirt    Upper body assist Assist Level: Contact Guard/Touching assist    Lower Body Dressing/Undressing Lower body dressing      What is the patient wearing?: Pants, Underwear/pull up     Lower body assist Assist for lower body dressing: Minimal Assistance - Patient > 75%     Toileting Toileting    Toileting assist Assist for toileting: Contact Guard/Touching assist      Transfers Chair/bed transfer  Transfers assist     Chair/bed transfer assist level: Contact Guard/Touching assist     Locomotion Ambulation   Ambulation assist      Assist level: Contact Guard/Touching assist Assistive device: Rollator Max distance: 175'   Walk 10 feet activity   Assist     Assist level: Contact Guard/Touching assist Assistive device: Rollator   Walk 50 feet activity   Assist    Assist level: Contact Guard/Touching assist Assistive device: Rollator    Walk 150 feet activity   Assist    Assist level: Contact Guard/Touching assist Assistive device: Rollator    Walk 10 feet on uneven surface  activity   Assist Walk 10 feet on uneven surfaces activity did not occur: Safety/medical concerns         Wheelchair     Assist Is the patient using a wheelchair?: Yes Type of Wheelchair: Manual    Wheelchair assist level: Dependent - Patient 0%      Wheelchair 50 feet with 2 turns activity    Assist        Assist Level: Dependent - Patient 0%   Wheelchair 150 feet activity     Assist      Assist Level: Dependent - Patient 0%   Blood pressure 134/68, pulse 77, temperature 97.9 F (36.6 C), temperature source Oral, resp. rate 18, height 5\' 5"  (1.651 m), weight 122.7 kg, SpO2 95%.  Medical Problem List and Plan: 1. 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 sternal precautions             -patient may shower, cover incisions please             -ELOS/Goals: 7 to 10 days, mod I to supervision with PT and OT              -Continue CIR therapies including PT, OT    2.  Antithrombotics: -DVT/anticoagulation:  Pharmaceutical: Lovenox 60 mg q HS             -antiplatelet therapy: Aspirin and Plavix    3. Pain Management: Tylenol, oxycodone, tramadol as needed   - Add Tylenol 1000 mg scheduled, robaxin 750 mg TID   - Add lido patch over upper sternum for localized pain, as  incision currently well approximated without drainage  11/15: Dc oxycodone, DC robaxin, increase tramadol to 50-100 mg Q6H PRN, add dilaudid 0.5 mg once daily PRN IV for breakthrough to limit pill and opiate burden --> resume prior regimen    11/16 pt satisfied with current regimen 4. Mood/Behavior/Sleep: LCSW to evaluate and provide emotional support             -continue Cymbalta 90 mg daily             -  continue melatonin 3 mg q HS --> increase to 5 mg scheduled 2000, and add trazodone 50 mg PRN             -antipsychotic agents: n/a  11/15: DC melatonin and trazodone to reduce pill burden --> resumed with melatonin 5 mg scheduled; trazodone 75 mg PRN    5. Neuropsych/cognition: This patient is capable of making decisions on her own behalf.   6. Skin/Wound Care: Routine skin care checks   - Sternal incision healing well   - R groin vein harvesting site edematous but closed, without drainage -- hematoma tense but stable   - Bilateral drain sutures remain intact - CTS APP to remove on 11/18   7. Fluids/Electrolytes/Nutrition: Routine Is and Os and follow-up chemistries             -continue Klor-con 20 mEq daily and follow-up BMP   - 11/16 recent labs ok:     8: Hypertension/CHF: monitor TID and prn             -Zestril, Lopressor held due to hypotension             -Lasix 40 mg daily resumed 11/13 (on Klor-con)             -BP well-controlled, weights stable   - 11/14: Add TED hose for edema   - 11/15: BMP today with extra 20 mg PO dose for weight gain / volume increase if looks OK --> labs reviewed, 1x 20 mg PO lasix given for edema 11/16 still with LE edema, weight up. appreciate IM help-        08/27/2023    3:00 AM 08/26/2023    7:45 PM 08/26/2023    1:41 PM  Vitals with BMI  Systolic 134 113 295  Diastolic 68 65 56  Pulse 77 74 85   Filed Weights   08/24/23 1825 08/25/23 0500 08/26/23 0500  Weight: 117.9 kg 118.8 kg 122.7 kg    9: Hyperlipidemia: continue statin,  Zetia, Vascepa   10: Episode of SVT: started amiodarone 400 mg BID on 11/12 for one week, then 200 mg BID for one week then 200 mg daily             -follow-up with Dr. Odis Hollingshead    - 11-14: Minimal bradycardia overnight, asymptomatic, monitor   - 11/16: HR stable  11: DM-2: CBGs QID; A1c = 6.5% (at home on Farxiga, [place], metformin and Mounjaro)             -continue SSI  -11-14 blood sugar slightly elevated, creatinine within normal limits, resume home metformin 1000 mg twice daily.   - 11/16: reasonable control    Recent Labs    08/26/23 1635 08/26/23 2059 08/27/23 0615  GLUCAP 126* 146* 122*     12: Thrush: continue Nystatin  -Not noted on exam 11-14; Nystatin Dced 11/15   13: OSA-on CPAP requiring supplemental O2  -On room air 11-14   - ISB Q4H for pulmonary recovery   14: ABLA with chest hematoma: follow-up CBC.  Hemoglobin 9.6 on 11/11   -9.8 on admission labs, monitor q. Weekly  15: Morbid Obesity BMI 43             -Dietary counseling   16: Left shoulder numbness and weakness.  Suspect she may have had axillary nerve injury.  Continue to monitor for now   17. Constipation/ileus - LBM 11/12 with mag citrate   - DC colace, add sennakot-S 11m  tab BID to miralax BID; keep docusate   - Add PRN lactulose 20 gm (wish to stay away from magnesium containing laxatives given cardiac issues)  11/16 mild ileus on xray. No bm yest but pt feeling better this morning.    -due for SMOG enema today   -increased senna-s to 2 tabs bid   -maintain nutrition/electrolytes   -appreciate hospitalist help   -continue current diet, OOB   LOS: 3 days A FACE TO FACE EVALUATION WAS PERFORMED  [name] 08/27/2023, 9:23 AM

## 2023-08-27 NOTE — Progress Notes (Signed)
Occupational Therapy Session Note  Patient Details  Name: Renee Ho MRN: [id number] Date of Birth: 11-28-1975  Today's Date: 08/27/2023 OT Individual Time: 1100-1200 OT Individual Time Calculation (min): 60 min    Short Term Goals: Week 1:  OT Short Term Goal 1 (Week 1): LTG=STG 2.2 ELOS  Skilled Therapeutic Interventions/Progress Updates:    Patient headed to the restroom at the time of arrival, patient informed that it would be best to call for help to ensure safe task performance. The pt indicated that she rested ok last night  and that she had a pain response of 7 on a 0-10 scale for sternal and LLE. The pt was able to ambulate  to the restroom free of device with close S.  The pt was able to complete toileting task with close S for clothing management and hyigene. The pt was able to ambulate to the shower and use the grab bar for placement onto the shower chair. The pt was able to bathe UB/LB with close S. The pt was able to donn her over head shirt, underwear, and short with SBA.  The pt was transported to the sink area with ModA using the w/c.  The pt was able to brush her teeth and comb her hair with s/u A. The pt was MaxA with non-skid socks. The pt ambulated back to the restroom secondary to taking medication to address constipation.  Nursing was alerted of the pt's location and the pt indicated that she would ringing the call bell for further assistance.   Therapy Documentation Precautions:  Precautions Precautions: Sternal Precaution Booklet Issued: Yes (comment) Restrictions Weight Bearing Restrictions: No RUE Weight Bearing: Non weight bearing LUE Weight Bearing: Non weight bearing Other Position/Activity Restrictions: sternal precautions  Therapy/Group: Individual Therapy  Lavona Mound 08/27/2023, 12:12 PM

## 2023-08-27 NOTE — Progress Notes (Signed)
Physical Therapy Note  Patient Details  Name: Renee Ho MRN: [id number] Date of Birth: 10/11/1976 Today's Date: 08/27/2023    Attempted to see in the pm for second session 1415 to 1530. Pt received enema this am secondary to constipation. Pt has been utilizing bathroom for last hour prior to session and is currently utilizing the bathroom. Pt deferred treatment at this time.    Rayford Halsted 08/27/2023, 2:52 PM

## 2023-08-27 NOTE — Progress Notes (Signed)
Physical Therapy Session Note  Patient Details  Name: Renee Ho MRN: [id number] Date of Birth: 06-Mar-1976  Today's Date: 08/27/2023 PT Individual Time: 0915-1005 PT Individual Time Calculation (min): 50 min   Short Term Goals: Week 1:  PT Short Term Goal 1 (Week 1): STG = LTG due to ELOS  Skilled Therapeutic Interventions/Progress Updates:      Therapy Documentation Precautions:  Precautions Precautions: Sternal Precaution Booklet Issued: Yes (comment) Restrictions Weight Bearing Restrictions: No RUE Weight Bearing: Non weight bearing LUE Weight Bearing: Non weight bearing Other Position/Activity Restrictions: sternal precautions General: PT Amount of Missed Time (min): 10 Minutes PT Missed Treatment Reason: Nursing care (enema) Vital Signs:   Pain: Pt c/o 8/10 pain all over, medicated prior to treatment. Pt willing to participate as able. Pt transferred supine to edge of bed with S and verbal cues. Pt tolerated edge of bed with S. Pt performed multiple sit to stand transfers with S and multiple stand pivot transfers with S to contact guard. Pt required occasional verbal cues throughout treatment session for sternal precautions. Pt ambulated 150 feet x 2 and 130 feet x 1 without assistive device with S and verbal cues. Slow cadence and decreased step length B. Pt performed seated B hip flex and LAQs, 3 sets x 20 reps each. Pt's nurse at bedside to perform enema due to constipation. Pt returned to room and left sitting on edge of bed with nurse and tech at bedside.   Therapy/Group: Individual Therapy  Rayford Halsted 08/27/2023, 10:10 AM

## 2023-08-28 DIAGNOSIS — S4400XD Injury of ulnar nerve at upper arm level, unspecified arm, subsequent encounter: Secondary | ICD-10-CM | POA: Diagnosis not present

## 2023-08-28 DIAGNOSIS — R5381 Other malaise: Secondary | ICD-10-CM | POA: Diagnosis not present

## 2023-08-28 DIAGNOSIS — D62 Acute posthemorrhagic anemia: Secondary | ICD-10-CM | POA: Diagnosis not present

## 2023-08-28 DIAGNOSIS — K560 Paralytic ileus: Secondary | ICD-10-CM | POA: Diagnosis not present

## 2023-08-28 LAB — BASIC METABOLIC PANEL
Anion gap: 8 (ref 5–15)
BUN: 10 mg/dL (ref 6–20)
CO2: 30 mmol/L (ref 22–32)
Calcium: 8.3 mg/dL — ABNORMAL LOW (ref 8.9–10.3)
Chloride: 100 mmol/L (ref 98–111)
Creatinine, Ser: 1.04 mg/dL — ABNORMAL HIGH (ref 0.44–1.00)
GFR, Estimated: >60 mL/min (ref >60)
Glucose, Bld: 119 mg/dL — ABNORMAL HIGH (ref 70–99)
Potassium: 3.6 mmol/L (ref 3.5–5.1)
Sodium: 138 mmol/L (ref 135–145)

## 2023-08-28 LAB — GLUCOSE, CAPILLARY
Glucose-Capillary: 110 mg/dL — ABNORMAL HIGH (ref 70–99)
Glucose-Capillary: 143 mg/dL — ABNORMAL HIGH (ref 70–99)
Glucose-Capillary: 120 mg/dL — ABNORMAL HIGH (ref 70–99)
Glucose-Capillary: 139 mg/dL — ABNORMAL HIGH (ref 70–99)

## 2023-08-28 MED ORDER — POTASSIUM CHLORIDE CRYS ER 20 MEQ PO TBCR
20.0000 meq | EXTENDED_RELEASE_TABLET | Freq: Two times a day (BID) | ORAL | Status: DC
  Administered 2023-08-28 – 2023-08-31 (×6): 20 meq via ORAL
  Filled 2023-08-28 (×6): qty 1

## 2023-08-28 NOTE — Progress Notes (Signed)
Occupational Therapy Note  Patient Details  Name: Bismah Weintraub MRN: [id number] Date of Birth: 1976-09-09  Today's Date: 08/28/2023 OT Individual Time: 1000-1045 OT Individual Time Calculation (min): 45 min   Patient /Family education session for safe transition to home.  The family was instructed regarding the pt's sternal pre-cautions, DME if needed, task/ home modification, AE,  and HEP to improve the pt's Ind and safety for  transition to home at the appointed time.  Lavona Mound 08/28/2023, 12:33 PM

## 2023-08-28 NOTE — Progress Notes (Signed)
Physical Therapy Session Note  Patient Details  Name: Alechia Cousineau MRN: [id number] Date of Birth: [birth year]  Today's Date: 08/28/2023 PT Individual Time: 1115-1155 PT Individual Time Calculation (min): 40 min   Short Term Goals: Week 1:  PT Short Term Goal 1 (Week 1): STG = LTG due to ELOS  Skilled Therapeutic Interventions/Progress Updates:  Pt's daughter and husband present. Family training completed with daughter. Pt is currently S for transfers, ambulation and stairs with L rail. Pt's daughter demonstrated appropriate guarding with pt ascending/descending 4 stairs with L rail and S. Pt completed car transfers with S, recommend utilizing daughters car at first due to decreased seat height and increased ease completely transfers. Pt requires occasional standing rest breaks. Also discussed with family pt requires occasional verbal cues to follow sternal precautions. Pt ambulated throughout facility with S, slow cadence and wide BOS, no loss of balance. Pt needed to utilize at end of treatment. Pt completed toilet transfers with S.   Therapy Documentation Precautions:  Precautions Precautions: Sternal Precaution Booklet Issued: Yes (comment) Restrictions Weight Bearing Restrictions: No RUE Weight Bearing: Non weight bearing LUE Weight Bearing: Non weight bearing Other Position/Activity Restrictions: sternal precautions General:   Vital Signs:   Pain: Pt received pain medicine prior to treatment.   Therapy/Group: Individual Therapy  Rayford Halsted 08/28/2023, 12:39 PM

## 2023-08-28 NOTE — Progress Notes (Addendum)
PROGRESS NOTE   Subjective/Complaints:  Pt slept well. Feeling much as she had multiple bm's thru today after SMOG enema. Feels that swelling is better too after lasix  ROS: Patient denies fever, rash, sore throat, blurred vision, dizziness, nausea, vomiting, diarrhea, cough, shortness of breath or chest pain, joint or back/neck pain, headache, or mood change.     Objective:   DG Abd 1 View  Result Date: 08/27/2023 CLINICAL DATA:  Ileus EXAM: ABDOMEN - 1 VIEW COMPARISON:  08/25/2023 FINDINGS: Similar gaseous distension of large and small bowel. No abnormally dilated loops of small bowel to suggest obstruction. No radio-opaque calculi or other significant radiographic abnormality are seen. IMPRESSION: Similar gaseous distension of large and small bowel, most compatible with ileus. Electronically Signed   By: Duanne Guess D.O.   On: 08/27/2023 11:34   No results for input(s): "WBC", "HGB", "HCT", "PLT" in the last 72 hours.  Recent Labs    08/26/23 1145 08/28/23 0459  NA 135 138  K 3.9 3.6  CL 96* 100  CO2 27 30  GLUCOSE 138* 119*  BUN 11 10  CREATININE 0.98 1.04*  CALCIUM 8.8* 8.3*    Intake/Output Summary (Last 24 hours) at 08/28/2023 0846 Last data filed at 08/27/2023 2200 Gross per 24 hour  Intake 240 ml  Output --  Net 240 ml        Physical Exam: Vital Signs Blood pressure 108/62, pulse 67, temperature (!) 97.5 F (36.4 C), resp. rate 17, height 5\' 5"  (1.651 m), weight 122.7 kg, SpO2 96%.  Constitutional: No distress . Vital signs reviewed. HEENT: NCAT, EOMI, oral membranes moist Neck: supple Cardiovascular: RRR without murmur. No JVD    Respiratory/Chest: CTA Bilaterally without wheezes or rales. Normal effort    GI/Abdomen: BS +, non-tender, non-distended Ext: no clubbing, cyanosis, or LE tr to 1+ edema Psych: pleasant and cooperative  Skin:  Sternal incision midline CDI, well-approximated with  mild erythema still around borders Drain sutures remain in place Right leg donor site  - sl serous drainage. Abdominal incisions with sutures remaining, CDI Bruising on R medial thigh - slightly tense, TTP, no warmth or change in contour Wounds remain consistent with below 11/17         Neuro: Alert and oriented x 4, apparent deficits Strength tested limited by sternal precautions in upper extremitie; antigravity and against resistance bilaterally Bilateral lower extremity strength 4+ out of 5 Decreased sensation over lateral left shoulder Altered sensation digits 2 through 5 on the left hand and digits 2 through 4 on the right hand  Musculoskeletal: Right lower extremity at proximal thigh and chest wall tenderness present +Continue impairments in range of motion left shoulder, less than 90 degrees abduction and flexion--stable appearance 11/17  Assessment/Plan: 1. Functional deficits which require 3+ hours per day of interdisciplinary therapy in a comprehensive inpatient rehab setting. Physiatrist is providing close team supervision and 24 hour management of active medical problems listed below. Physiatrist and rehab team continue to assess barriers to discharge/monitor patient progress toward functional and medical goals  Care Tool:  Bathing    Body parts bathed by patient: Abdomen, Chest, Left arm, Right arm, Buttocks, Front perineal  area, Right upper leg, Left upper leg, Left lower leg, Face, Right lower leg         Bathing assist Assist Level: Contact Guard/Touching assist     Upper Body Dressing/Undressing Upper body dressing   What is the patient wearing?: Pull over shirt    Upper body assist Assist Level: Contact Guard/Touching assist    Lower Body Dressing/Undressing Lower body dressing      What is the patient wearing?: Pants, Underwear/pull up     Lower body assist Assist for lower body dressing: Minimal Assistance - Patient > 75%      Toileting Toileting    Toileting assist Assist for toileting: Contact Guard/Touching assist     Transfers Chair/bed transfer  Transfers assist     Chair/bed transfer assist level: Contact Guard/Touching assist     Locomotion Ambulation   Ambulation assist      Assist level: Supervision/Verbal cueing Assistive device: No Device Max distance: 150   Walk 10 feet activity   Assist     Assist level: Supervision/Verbal cueing Assistive device: No Device   Walk 50 feet activity   Assist    Assist level: Supervision/Verbal cueing Assistive device: No Device    Walk 150 feet activity   Assist    Assist level: Supervision/Verbal cueing Assistive device: No Device    Walk 10 feet on uneven surface  activity   Assist Walk 10 feet on uneven surfaces activity did not occur: Safety/medical concerns         Wheelchair     Assist Is the patient using a wheelchair?: Yes Type of Wheelchair: Manual    Wheelchair assist level: Dependent - Patient 0%      Wheelchair 50 feet with 2 turns activity    Assist        Assist Level: Dependent - Patient 0%   Wheelchair 150 feet activity     Assist      Assist Level: Dependent - Patient 0%   Blood pressure 108/62, pulse 67, temperature (!) 97.5 F (36.4 C), resp. rate 17, height 5\' 5"  (1.651 m), weight 122.7 kg, SpO2 96%.  Medical Problem List and Plan: 1. 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 sternal precautions             -patient may shower, cover incisions please             -ELOS/Goals: 7 to 10 days, mod I to supervision with PT and OT             -Continue CIR therapies including PT, OT    2.  Antithrombotics: -DVT/anticoagulation:  Pharmaceutical: Lovenox 60 mg q HS             -antiplatelet therapy: Aspirin and Plavix    3. Pain Management: Tylenol, oxycodone, tramadol as needed   - Add Tylenol 1000 mg scheduled, robaxin 750 mg  TID   - Add lido patch over upper sternum for localized pain, as incision currently well approximated without drainage  11/15: Dc oxycodone, DC robaxin, increase tramadol to 50-100 mg Q6H PRN, add dilaudid 0.5 mg once daily PRN IV for breakthrough to limit pill and opiate burden --> resume prior regimen    11/17 pt satisfied with current regimen 4. Mood/Behavior/Sleep: LCSW to evaluate and provide emotional support             -continue Cymbalta 90 mg daily             -  continue melatonin 3 mg q HS --> increase to 5 mg scheduled 2000, and add trazodone 50 mg PRN             -antipsychotic agents: n/a  11/15: DC melatonin and trazodone to reduce pill burden --> resumed with melatonin 5 mg scheduled; trazodone 75 mg PRN    5. Neuropsych/cognition: This patient is capable of making decisions on her own behalf.   6. Skin/Wound Care: Routine skin care checks   - Sternal incision healing well   - R groin vein harvesting site edematous but closed, without drainage -- hematoma tense but stable   - Bilateral drain sutures remain intact - CTS APP to remove on 11/18   7. Fluids/Electrolytes/Nutrition: Routine Is and Os and follow-up chemistries             -continue Klor-con 20 mEq daily and follow-up BMP   - 11/17 recent labs ok:     8: Hypertension/CHF: monitor TID and prn             -Zestril, Lopressor held due to hypotension             -Lasix 40 mg daily resumed 11/13 (on Klor-con)             -BP well-controlled, weights stable   - 11/14: Add TED hose for edema   - 11/15: BMP today with extra 20 mg PO dose for weight gain / volume increase if looks OK --> labs reviewed, 1x 20 mg PO lasix given for edema 11/17  I/O's not recorded accurately yesterday and no weight today  -will request daily weights and strict I/Os  -still on IV lasix, continue per IM--appreciate their help!    -will increase kdur to bid--check labs tomorrow    08/28/2023    5:47 AM 08/27/2023    8:05 PM 08/27/2023     2:45 PM  Vitals with BMI  Systolic 108 130 960  Diastolic 62 63 64  Pulse 67 75 75   Filed Weights   08/24/23 1825 08/25/23 0500 08/26/23 0500  Weight: 117.9 kg 118.8 kg 122.7 kg    9: Hyperlipidemia: continue statin, Zetia, Vascepa   10: Episode of SVT: started amiodarone 400 mg BID on 11/12 for one week, then 200 mg BID for one week then 200 mg daily             -follow-up with Dr. Odis Hollingshead    - 11-14: Minimal bradycardia overnight, asymptomatic, monitor   - 11/17: HR stable  11: DM-2: CBGs QID; A1c = 6.5% (at home on Farxiga, [place], metformin and Mounjaro)             -continue SSI  -11-14 blood sugar slightly elevated, creatinine within normal limits, resume home metformin 1000 mg twice daily.   - 11/17: reasonable control    Recent Labs    08/27/23 1649 08/27/23 2039 08/28/23 0550  GLUCAP 134* 142* 110*     12: Thrush: continue Nystatin  -Not noted on exam 11-14; Nystatin Dced 11/15   13: OSA-on CPAP requiring supplemental O2  -On room air 11-14   - ISB Q4H for pulmonary recovery   14: ABLA with chest hematoma: follow-up CBC.  Hemoglobin 9.6 on 11/11   -9.8 on admission labs, monitor q. Weekly  15: Morbid Obesity BMI 43             -Dietary counseling   16: Left shoulder numbness and weakness.  Suspect she may have had axillary  nerve injury.  Continue to monitor for now   17. Constipation/ileus - LBM 11/12 with mag citrate   - DC colace, add sennakot-S 34m tab BID to miralax BID; keep docusate   - Add PRN lactulose 20 gm (wish to stay away from magnesium containing laxatives given cardiac issues)  11/17 mild ileus on xray.    -multiple BM's after SMOG enema. Feels better   -increased senna-s to 2 tabs bid   -maintain nutrition/electrolytes   -appreciate hospitalist help   -continue current diet, OOB   LOS: 4 days A FACE TO FACE EVALUATION WAS PERFORMED  [name] 08/28/2023, 8:46 AM

## 2023-08-28 NOTE — Progress Notes (Signed)
PROGRESS NOTE  Renee Ho  FAO:[id number] DOB: 08-30-1976 DOA: 08/24/2023 PCP: Melida Quitter, PA   Brief Narrative: Patient is a 47 year old female with history of coronary disease status post PCI, hypertension, hyperlipidemia, obesity who recently had CABG on 08/19/2023 and was transferred to CIR on 08/24/2023 started having nausea, found to have ileus as per abdominal x-ray on 11/14.  We were consulted for concern of worsening ileus.  Last bowel movement was on October 11.  No vomiting but she complained of intermittent abdominal cramping.  Started on aggressive bowel regimen including enema resulting in large bowel movement.  Abdomen discomfort has resolved.  Assessment & Plan:  Principal Problem:   Debility Active Problems:   Ileus (HCC)   Ileus/constipation: Complaint of nausea, abdominal cramps.  Abdominal x-ray was suspicious for ileus.  She had good bowel sounds , abdomen was nontender.  Given enema and 11/16 resulting in large volume bowel movement. Ileus /constipation has resolved.  Continue  bowel regimen.   Bilateral lower extremity edema/basilar crackles, acute on chronic diastolic CHF exacerbation:  On room air.  Appeared severely volume overloaded on 11/16 .  Lasix changed  to IV. Recent echo shows EF of 60-65%, grade 1 diastolic dysfunction. Improvement in the lower extremity edema.  Can continue 1 more dose  of IV Lasix for this evening.  Can start on oral Lasix 40 mg daily from tomorrow.  Monitor electrolytes.  Coronary artery disease: Had CABG done on 08/19/2023.  Cardiothoracic surgery following.  On aspirin and Plavix, ezetimibe, fenofibrate  Hypertension: Currently blood pressure stable  Hyperlipidemia: On ezetimibe, fenofibrate  Diabetes type 2: On metformin, sliding scale.  Monitor blood sugars.  Morbid obesity: BMI 45  TRH will sign off.  Please call as needed.        DVT prophylaxis:Place TED hose Start: 08/25/23 1020     Code Status:  Full Code   Antimicrobials:  Anti-infectives (From admission, onward)    None       Subjective: Patient seen and examined at bedside today.  Hemodynamically stable.  Comfortable.  Sitting in bed.  Had multiple large volume bowel movements since yesterday.  Denies any abdominal discomfort or nausea.  Bilateral lower extremity edema significantly improved today.  Objective: Vitals:   08/27/23 1445 08/27/23 2005 08/28/23 0547 08/28/23 0856  BP: (!) 150/64 130/63 108/62   Pulse: 75 75 67   Resp: 19 18 17    Temp: 97.6 F (36.4 C) 97.8 F (36.6 C) (!) 97.5 F (36.4 C)   TempSrc: Oral     SpO2: 96% 92% 96%   Weight:    118 kg  Height:        Intake/Output Summary (Last 24 hours) at 08/28/2023 0936 Last data filed at 08/27/2023 2200 Gross per 24 hour  Intake 240 ml  Output --  Net 240 ml   Filed Weights   08/25/23 0500 08/26/23 0500 08/28/23 0856  Weight: 118.8 kg 122.7 kg 118 kg    Examination:  General exam: Overall comfortable, not in distress, morbidly obese HEENT: PERRL Respiratory system:  no wheezes or crackles  Cardiovascular system: S1 & S2 heard, RRR.  Gastrointestinal system: Abdomen is nondistended, soft and nontender. Central nervous system: Alert and oriented Extremities: Trace lower extremity pitting edema, no clubbing ,no cyanosis Skin: No rashes, no ulcers,no icterus     Data Reviewed: I have personally reviewed following labs and imaging studies  CBC: Recent Labs  Lab 08/22/23 0320 08/25/23 0815  WBC 10.2 8.3  NEUTROABS  --  5.6  HGB 9.6* 9.8*  HCT 29.4* 29.6*  MCV 91.6 93.7  PLT 126* 203   Basic Metabolic Panel: Recent Labs  Lab 08/22/23 0320 08/24/23 0957 08/25/23 0815 08/26/23 1145 08/28/23 0459  NA 135 134* 134* 135 138  K 3.7 3.6 3.7 3.9 3.6  CL 102 95* 97* 96* 100  CO2 26 29 30 27 30   GLUCOSE 133* 143* 198* 138* 119*  BUN 14 13 11 11 10   CREATININE 0.84 0.89 0.90 0.98 1.04*  CALCIUM 8.4* 8.3* 8.3* 8.8* 8.3*  MG  --   2.2  --   --   --      Recent Results (from the past 240 hour(s))  SARS Coronavirus 2 by RT PCR (hospital order, performed in [facility] hospital lab) *cepheid single result test* Anterior Nasal Swab     Status: None   Collection Time: 08/18/23  2:40 PM   Specimen: Anterior Nasal Swab  Result Value Ref Range Status   SARS Coronavirus 2 by RT PCR NEGATIVE NEGATIVE Final    Comment: Performed at [facility], [address], [place], [place] [address]     Radiology Studies: DG Abd 1 View  Result Date: 08/27/2023 CLINICAL DATA:  Ileus EXAM: ABDOMEN - 1 VIEW COMPARISON:  08/25/2023 FINDINGS: Similar gaseous distension of large and small bowel. No abnormally dilated loops of small bowel to suggest obstruction. No radio-opaque calculi or other significant radiographic abnormality are seen. IMPRESSION: Similar gaseous distension of large and small bowel, most compatible with ileus. Electronically Signed   By: Duanne Guess D.O.   On: 08/27/2023 11:34    Scheduled Meds:  acetaminophen  1,000 mg Oral TID   amiodarone  400 mg Oral BID   Followed by   Melene Muller ON 08/30/2023] amiodarone  200 mg Oral BID   Followed by   Melene Muller ON 09/07/2023] amiodarone  200 mg Oral Daily   aspirin EC  81 mg Oral Daily   atorvastatin  80 mg Oral Daily   bisacodyl  10 mg Oral Daily   clopidogrel  75 mg Oral Daily   DULoxetine  90 mg Oral Daily   enoxaparin (LOVENOX) injection  60 mg Subcutaneous QHS   ezetimibe  10 mg Oral Daily   fenofibrate  160 mg Oral Daily   furosemide  40 mg Intravenous Q12H   icosapent Ethyl  2 g Oral BID   insulin aspart  0-24 Units Subcutaneous TID WC   lidocaine  1 patch Transdermal Q24H   melatonin  5 mg Oral QHS   metFORMIN  1,000 mg Oral BID WC   methocarbamol  750 mg Oral TID   pantoprazole  40 mg Oral Daily   polyethylene glycol  17 g Oral BID   potassium chloride  20 mEq Oral BID   senna-docusate  2 tablet Oral BID   simethicone  80 mg Oral QID   Continuous  Infusions:   LOS: 4 days   Burnadette Pop, MD [facility] P11/17/2024, 9:36 AM

## 2023-08-28 NOTE — Progress Notes (Signed)
Pt c/o pain in inner right thigh.  Upon examining the area pt has a large bruise and area is hard around it.

## 2023-08-29 ENCOUNTER — Ambulatory Visit (HOSPITAL_COMMUNITY): Payer: 59 | Attending: Physician Assistant

## 2023-08-29 DIAGNOSIS — M7989 Other specified soft tissue disorders: Secondary | ICD-10-CM | POA: Insufficient documentation

## 2023-08-29 DIAGNOSIS — R5381 Other malaise: Secondary | ICD-10-CM | POA: Diagnosis not present

## 2023-08-29 LAB — CBC
HCT: 27.9 % — ABNORMAL LOW (ref 36.0–46.0)
Hemoglobin: 8.8 g/dL — ABNORMAL LOW (ref 12.0–15.0)
MCH: 29.6 pg (ref 26.0–34.0)
MCHC: 31.5 g/dL (ref 30.0–36.0)
MCV: 93.9 fL (ref 80.0–100.0)
Platelets: 242 10*3/uL (ref 150–400)
RBC: 2.97 MIL/uL — ABNORMAL LOW (ref 3.87–5.11)
RDW: 14.1 % (ref 11.5–15.5)
WBC: 8.1 10*3/uL (ref 4.0–10.5)
nRBC: 0 % (ref 0.0–0.2)

## 2023-08-29 LAB — GLUCOSE, CAPILLARY
Glucose-Capillary: 134 mg/dL — ABNORMAL HIGH (ref 70–99)
Glucose-Capillary: 145 mg/dL — ABNORMAL HIGH (ref 70–99)
Glucose-Capillary: 140 mg/dL — ABNORMAL HIGH (ref 70–99)
Glucose-Capillary: 127 mg/dL — ABNORMAL HIGH (ref 70–99)

## 2023-08-29 LAB — BASIC METABOLIC PANEL
Anion gap: 8 (ref 5–15)
BUN: 13 mg/dL (ref 6–20)
CO2: 31 mmol/L (ref 22–32)
Calcium: 8.4 mg/dL — ABNORMAL LOW (ref 8.9–10.3)
Chloride: 97 mmol/L — ABNORMAL LOW (ref 98–111)
Creatinine, Ser: 1.12 mg/dL — ABNORMAL HIGH (ref 0.44–1.00)
GFR, Estimated: >60 mL/min (ref >60)
Glucose, Bld: 142 mg/dL — ABNORMAL HIGH (ref 70–99)
Potassium: 4 mmol/L (ref 3.5–5.1)
Sodium: 136 mmol/L (ref 135–145)

## 2023-08-29 MED ORDER — FUROSEMIDE 40 MG PO TABS
40.0000 mg | ORAL_TABLET | Freq: Every day | ORAL | Status: DC
  Administered 2023-08-29 – 2023-08-31 (×3): 40 mg via ORAL
  Filled 2023-08-29 (×3): qty 1

## 2023-08-29 NOTE — Progress Notes (Signed)
Physical Therapy Session Note  Patient Details  Name: Renee Ho MRN: [id number] Date of Birth: Oct 02, 1976  Today's Date: 08/29/2023 PT Individual Time: 1330-1400 PT Individual Time Calculation (min): 30 min   Short Term Goals: Week 1:  PT Short Term Goal 1 (Week 1): STG = LTG due to ELOS  Skilled Therapeutic Interventions/Progress Updates:    Session focused on functional gait on unit without AD for generalized strengthening and cardiovascular endurance training, education and discussion about home set-up and modifications for increased independence and maintenance of precautions functionally, stair negotiation training for home entry and community mobility, and functional dynamic reaching task for balance training to simulate household tasks in bedroom/kitchen set up.   Pt performed basic transfers and short distance gait with overall supervision to modified independent. Supervision for longer gait distances due to monitoring for O2 and cues for breathing techniques/pacing.   Pt reports going home with rollator so discussed using rollator seat to assist with carrying of items in the household due to restrictions with lifting. Functional reaching task to lower and midlevel surfaces with supervision with pt able to self recognize precautions and not to reach items on higher shelf. Repeated activity x 2 including putting items back on shelf.   Administered gait velocity/43m walk test with pt demonstrating gait velocity 2.30 m/s  Retro gait training x 25' for balance retraining and dual task activity with supervision. Slower gait speed noted and pt reports it not feeling as comfortable.  Supervision to modified independent to return back to room and set up with all needs in reach.   Recommend home pulse ox to monitor O2 sats during activities.   Therapy Documentation Precautions:  Precautions Precautions: Sternal Restrictions Weight Bearing Restrictions: No Other  Position/Activity Restrictions: sternal precautions    Vital Signs:  HR = 77-79 bpm with activity O2 on room air ranging from 83-88% with activity and increasing to 92-94% with cues for pursed lip breathing < 1 min. 1 bout requiring about 2 min to recover. Denies SOB.  Pain: Rates sternal pain at 5/10. No interventions needed at this time. Reports premedicated.       Therapy/Group: Individual Therapy  Karolee Stamps Darrol Poke, PT, DPT, CBIS  08/29/2023, 2:06 PM

## 2023-08-29 NOTE — Progress Notes (Signed)
PROGRESS NOTE   Subjective/Complaints:  Still with significant swelling this AM, especially in RLE; see note from PA [employer].  RLE venous duplex pending; R leg increasingly tight over groin hematoma and posterior knee.   Cr mildly increased likely d/t diuresis this weekend; otherwise AM labs significant for HgB 9.8-> 8.8.   Hypotensive over the last 2 days, but moving well, per patient and therapies should be ready for dishcarge Wednesday,   ROS: + R leg pain and edema - increasing, +Constipation - improved. Patient denies fever, rash, sore throat, blurred vision, dizziness, nausea, vomiting, diarrhea, cough, shortness of breath or chest pain, joint or back/neck pain, headache, or mood change.     Objective:   No results found. Recent Labs    08/29/23 0624  WBC 8.1  HGB 8.8*  HCT 27.9*  PLT 242    Recent Labs    08/28/23 0459 08/29/23 0624  NA 138 136  K 3.6 4.0  CL 100 97*  CO2 30 31  GLUCOSE 119* 142*  BUN 10 13  CREATININE 1.04* 1.12*  CALCIUM 8.3* 8.4*    Intake/Output Summary (Last 24 hours) at 08/29/2023 1205 Last data filed at 08/28/2023 1800 Gross per 24 hour  Intake 358 ml  Output --  Net 358 ml        Physical Exam: Vital Signs Blood pressure 93/62, pulse 65, temperature (!) 97.5 F (36.4 C), resp. rate 17, height 5\' 5"  (1.651 m), weight 118.3 kg, SpO2 97%.  Constitutional: No distress . Vital signs reviewed. Sitting up at bedside.  HEENT: NCAT, EOMI, oral membranes moist Neck: supple Cardiovascular: RRR without murmur. No JVD  . + RLE edema 2+.  Respiratory/Chest: CTA Bilaterally without wheezes or rales. Normal effort    GI/Abdomen: BS +, non-tender, non-distended Ext: no clubbing, cyanosis, or LE tr to 1+ edema Psych: pleasant and cooperative  Skin:  Sternal incision midline CDI, well-approximated with mild erythema still around borders Drain sutures removed - R middle  incision open with scant bloody drainage.  Right leg donor site  - No current drainage. Bruising on R medial thigh - slightly tense, TTP, no warmth or change in contour Wounds remain consistent with below 11/17 and 11/18         Neuro: Alert and oriented x 4, apparent deficits Strength tested limited by sternal precautions in upper extremitie; antigravity and against resistance bilaterally Bilateral lower extremity strength 4+ out of 5 Decreased sensation over lateral left shoulder Altered sensation digits 2 through 5 on the left hand and digits 2 through 4 on the right hand  Musculoskeletal: Right lower extremity at proximal thigh and chest wall tenderness present +Continue impairments in range of motion left shoulder, less than 90 degrees abduction and flexion   Assessment/Plan: 1. Functional deficits which require 3+ hours per day of interdisciplinary therapy in a comprehensive inpatient rehab setting. Physiatrist is providing close team supervision and 24 hour management of active medical problems listed below. Physiatrist and rehab team continue to assess barriers to discharge/monitor patient progress toward functional and medical goals  Care Tool:  Bathing    Body parts bathed by patient: Abdomen, Chest, Left arm, Right arm,  Buttocks, Front perineal area, Right upper leg, Left upper leg, Left lower leg, Face, Right lower leg         Bathing assist Assist Level: Contact Guard/Touching assist     Upper Body Dressing/Undressing Upper body dressing   What is the patient wearing?: Pull over shirt    Upper body assist Assist Level: Contact Guard/Touching assist    Lower Body Dressing/Undressing Lower body dressing      What is the patient wearing?: Pants, Underwear/pull up     Lower body assist Assist for lower body dressing: Minimal Assistance - Patient > 75%     Toileting Toileting    Toileting assist Assist for toileting: Contact Guard/Touching assist      Transfers Chair/bed transfer  Transfers assist     Chair/bed transfer assist level: Supervision/Verbal cueing     Locomotion Ambulation   Ambulation assist      Assist level: Supervision/Verbal cueing Assistive device: Hand held assist Max distance: 200   Walk 10 feet activity   Assist     Assist level: Supervision/Verbal cueing Assistive device: No Device   Walk 50 feet activity   Assist    Assist level: Supervision/Verbal cueing Assistive device: No Device    Walk 150 feet activity   Assist    Assist level: Supervision/Verbal cueing Assistive device: No Device    Walk 10 feet on uneven surface  activity   Assist Walk 10 feet on uneven surfaces activity did not occur: Safety/medical concerns   Assist level: Supervision/Verbal cueing     Wheelchair     Assist Is the patient using a wheelchair?: Yes Type of Wheelchair: Manual    Wheelchair assist level: Dependent - Patient 0%      Wheelchair 50 feet with 2 turns activity    Assist        Assist Level: Dependent - Patient 0%   Wheelchair 150 feet activity     Assist      Assist Level: Dependent - Patient 0%   Blood pressure 93/62, pulse 65, temperature (!) 97.5 F (36.4 C), resp. rate 17, height 5\' 5"  (1.651 m), weight 118.3 kg, SpO2 97%.  Medical Problem List and Plan: 1. 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 sternal precautions             -patient may shower, cover incisions please             -ELOS/Goals: 7 to 10 days, mod I to supervision with PT and OT - DC goal 11/20             -Continue CIR therapies including PT, OT    2.  Antithrombotics: -DVT/anticoagulation:  Pharmaceutical: Lovenox 60 mg q HS  BL LE Duplex 11/18 negative             -antiplatelet therapy: Aspirin and Plavix    3. Pain Management: Tylenol, oxycodone, tramadol as needed   - Add Tylenol 1000 mg scheduled, robaxin 750 mg TID   - Add lido  patch over upper sternum for localized pain, as incision currently well approximated without drainage  11/15: Dc oxycodone, DC robaxin, increase tramadol to 50-100 mg Q6H PRN, add dilaudid 0.5 mg once daily PRN IV for breakthrough to limit pill and opiate burden --> resume prior regimen    11/17 pt satisfied with current regimen  11/18: Using 10 mg consistently; can DC tramadol at discharge and wean to 5 mg QID to off over 2 weeks on  discharge   4. Mood/Behavior/Sleep: LCSW to evaluate and provide emotional support             -continue Cymbalta 90 mg daily             -continue melatonin 3 mg q HS --> increase to 5 mg scheduled 2000, and add trazodone 50 mg PRN             -antipsychotic agents: n/a  11/15: DC melatonin and trazodone to reduce pill burden --> resumed with melatonin 5 mg scheduled; trazodone 75 mg PRN   - Sleeping well   5. Neuropsych/cognition: This patient is capable of making decisions on her own behalf.   6. Skin/Wound Care: Routine skin care checks   - Sternal incision healing well   - R groin vein harvesting site closed   - Bilateral drain sutures remain intact - CTS APP to removed on 11/18   - 11/18: R drain site open, bleeding after suture removal; cover   7. Fluids/Electrolytes/Nutrition: Routine Is and Os and follow-up chemistries             -continue Klor-con 20 mEq daily and follow-up BMP   - 11/17 recent labs ok; 11/18 relatively stable considering IV lasix this weekend - recheck in AM   8: Hypertension/CHF: monitor TID and prn             -Zestril, Lopressor held due to hypotension             -Lasix 40 mg daily resumed 11/13 (on Klor-con)             -BP well-controlled, weights stable   - 11/14: Add TED hose for edema   - 11/15: BMP today with extra 20 mg PO dose for weight gain / volume increase if looks OK --> labs reviewed, 1x 20 mg PO lasix given for edema 11/17  I/O's not recorded accurately yesterday and no weight today  -will request daily  weights and strict I/Os  -still on IV lasix, continue per IM--appreciate their help!    -will increase kdur to bid--check labs tomorrow  11/18: K 4.0; weights down/stable from 11/15 - ongoing edema RLE likely from vein harvesting.    - Will D/w Dr. Tessa Lerner prior to discharge possible PRN dosing for lasix based on weight gain / edema     08/29/2023    9:00 AM 08/29/2023    5:33 AM 08/28/2023    7:37 PM  Vitals with BMI  Weight 260 lbs 11 oz    BMI 43.38    Systolic  93 112  Diastolic  62 59  Pulse  65 73   Filed Weights   08/26/23 0500 08/28/23 0856 08/29/23 0900  Weight: 122.7 kg 118 kg 118.3 kg    9: Hyperlipidemia: continue statin, Zetia, Vascepa   10: Episode of SVT: started amiodarone 400 mg BID on 11/12 for one week, then 200 mg BID for one week then 200 mg daily             -follow-up with Dr. Odis Hollingshead    - 11-14: Minimal bradycardia overnight, asymptomatic, monitor   - 11/17: HR stable  11: DM-2: CBGs QID; A1c = 6.5% (at home on Farxiga, [place], metformin and Mounjaro)             -continue SSI  -11-14 blood sugar slightly elevated, creatinine within normal limits, resume home metformin 1000 mg twice daily.   - 11/17: reasonable control   -  11/18: BMP in AM; if Cr normalized will resume Farxiga 10 mg    Recent Labs    08/28/23 1616 08/28/23 2029 08/29/23 0628  GLUCAP 120* 139* 134*     12: Thrush: continue Nystatin  -Not noted on exam 11-14; Nystatin Dced 11/15   13: OSA-on CPAP requiring supplemental O2  -On room air 11-14   - ISB Q4H for pulmonary recovery   14: ABLA with chest hematoma: follow-up CBC.  Hemoglobin 9.6 on 11/11   -9.8 on admission labs, monitor q. Weekly  15: Morbid Obesity BMI 43             -Dietary counseling   16: Left shoulder numbness and weakness.  Suspect she may have had axillary nerve injury.  Continue to monitor for now   17. Constipation/ileus - LBM 11/12 with mag citrate   - DC colace, add sennakot-S 28m tab BID  to miralax BID; keep docusate   - Add PRN lactulose 20 gm (wish to stay away from magnesium containing laxatives given cardiac issues)  11/17 mild ileus on xray.    -multiple BM's after SMOG enema. Feels better   -increased senna-s to 2 tabs bid   -maintain nutrition/electrolytes   -appreciate hospitalist help   -continue current diet, OOB  11/18: Much improved  18. Anemia - HgB 9.8->8.8 11/18   - CBC repeat in AM  - RLE vein harvesting site hematoma most likely cause - without drainage -- tense but stable--examined by CTS APP 11/18 w/o concern  LOS: 5 days A FACE TO FACE EVALUATION WAS PERFORMED  Angelina Sheriff 08/29/2023, 12:05 PM

## 2023-08-29 NOTE — Plan of Care (Signed)
  Problem: Consults Goal: RH GENERAL PATIENT EDUCATION Description: See Patient Education module for education specifics. 08/29/2023 1906 by Cletis Media, RN Outcome: Progressing 08/29/2023 1643 by Cletis Media, RN Outcome: Progressing   Problem: RH BOWEL ELIMINATION Goal: RH STG MANAGE BOWEL WITH ASSISTANCE Description: STG Manage Bowel with min Assistance. 08/29/2023 1906 by Cletis Media, RN Outcome: Progressing 08/29/2023 1643 by Cletis Media, RN Outcome: Progressing Goal: RH STG MANAGE BOWEL W/MEDICATION W/ASSISTANCE Description: STG Manage Bowel with Medication with min Assistance. 08/29/2023 1906 by Cletis Media, RN Outcome: Progressing 08/29/2023 1643 by Cletis Media, RN Outcome: Progressing   Problem: RH BLADDER ELIMINATION Goal: RH STG MANAGE BLADDER WITH ASSISTANCE Description: STG Manage Bladder With min Assistance 08/29/2023 1906 by Cletis Media, RN Outcome: Progressing 08/29/2023 1643 by Cletis Media, RN Outcome: Progressing   Problem: RH SKIN INTEGRITY Goal: RH STG SKIN FREE OF INFECTION/BREAKDOWN Description: Incisions will continue to heal and be free of infection with min assist  08/29/2023 1906 by Cletis Media, RN Outcome: Progressing 08/29/2023 1643 by Cletis Media, RN Outcome: Progressing Goal: RH STG ABLE TO PERFORM INCISION/WOUND CARE W/ASSISTANCE Description: STG Able To Perform Incision/Wound Care With min Assistance. 08/29/2023 1906 by Cletis Media, RN Outcome: Progressing 08/29/2023 1643 by Cletis Media, RN Outcome: Progressing   Problem: RH SAFETY Goal: RH STG ADHERE TO SAFETY PRECAUTIONS W/ASSISTANCE/DEVICE Description: STG Adhere to Safety Precautions With cueing Assistance/Device. 08/29/2023 1906 by Cletis Media, RN Outcome: Progressing 08/29/2023 1643 by Cletis Media, RN Outcome: Progressing Goal: RH STG DECREASED RISK OF FALL WITH ASSISTANCE Description: STG Decreased Risk of Fall With min  Assistance. 08/29/2023 1906 by Cletis Media, RN Outcome: Progressing 08/29/2023 1643 by Cletis Media, RN Outcome: Progressing   Problem: RH PAIN MANAGEMENT Goal: RH STG PAIN MANAGED AT OR BELOW PT'S PAIN GOAL Description: Less than 4 with use of PRN medications min assist  08/29/2023 1906 by Cletis Media, RN Outcome: Progressing 08/29/2023 1643 by Cletis Media, RN Outcome: Progressing   Problem: RH KNOWLEDGE DEFICIT GENERAL Goal: RH STG INCREASE KNOWLEDGE OF SELF CARE AFTER HOSPITALIZATION Description: Patient will be able to manage new medications, self care, and diet/lifestyle modifications to improve cholesterol levels, improve A1C, and overall health from nursing education and nursing handouts independently  08/29/2023 1906 by Cletis Media, RN Outcome: Progressing 08/29/2023 1643 by Cletis Media, RN Outcome: Progressing

## 2023-08-29 NOTE — Plan of Care (Signed)
  Problem: Consults Goal: RH GENERAL PATIENT EDUCATION Description: See Patient Education module for education specifics. Outcome: Progressing   Problem: RH BOWEL ELIMINATION Goal: RH STG MANAGE BOWEL WITH ASSISTANCE Description: STG Manage Bowel with min Assistance. Outcome: Progressing Goal: RH STG MANAGE BOWEL W/MEDICATION W/ASSISTANCE Description: STG Manage Bowel with Medication with min Assistance. Outcome: Progressing   Problem: RH BLADDER ELIMINATION Goal: RH STG MANAGE BLADDER WITH ASSISTANCE Description: STG Manage Bladder With min Assistance Outcome: Progressing   Problem: RH SKIN INTEGRITY Goal: RH STG SKIN FREE OF INFECTION/BREAKDOWN Description: Incisions will continue to heal and be free of infection with min assist  Outcome: Progressing Goal: RH STG ABLE TO PERFORM INCISION/WOUND CARE W/ASSISTANCE Description: STG Able To Perform Incision/Wound Care With min Assistance. Outcome: Progressing   Problem: RH SAFETY Goal: RH STG ADHERE TO SAFETY PRECAUTIONS W/ASSISTANCE/DEVICE Description: STG Adhere to Safety Precautions With cueing Assistance/Device. Outcome: Progressing Goal: RH STG DECREASED RISK OF FALL WITH ASSISTANCE Description: STG Decreased Risk of Fall With min Assistance. Outcome: Progressing   Problem: RH PAIN MANAGEMENT Goal: RH STG PAIN MANAGED AT OR BELOW PT'S PAIN GOAL Description: Less than 4 with use of PRN medications min assist  Outcome: Progressing   Problem: RH KNOWLEDGE DEFICIT GENERAL Goal: RH STG INCREASE KNOWLEDGE OF SELF CARE AFTER HOSPITALIZATION Description: Patient will be able to manage new medications, self care, and diet/lifestyle modifications to improve cholesterol levels, improve A1C, and overall health from nursing education and nursing handouts independently  Outcome: Progressing

## 2023-08-29 NOTE — Progress Notes (Signed)
Patient ID: Renee Ho, female   DOB: September 14, 1976, 47 y.o.   MRN: [id number]  SW received updates from medical team that pt can likely discharge on Wednesday (11/20) and will confirm. Therapy reports a rollator and TTB are needed  SW met with pt in room to inform on above. SW will order DME items and follow-up with updates after team conference.   Cecile Sheerer, MSW, LCSW Office: [phone] Cell: [phone] Fax: [phone]

## 2023-08-29 NOTE — Progress Notes (Signed)
Physical Therapy Session Note  Patient Details  Name: Renee Ho MRN: [id number] Date of Birth: 05-19-1976  Today's Date: 08/29/2023 PT Individual Time: 1000-1110 PT Individual Time Calculation (min): 70 min   Short Term Goals: Week 1:  PT Short Term Goal 1 (Week 1): STG = LTG due to ELOS  Skilled Therapeutic Interventions/Progress Updates:    Pt presents seated in WC behind nurse's station, agreeable to PT. Pt reporting pain in sternum, ongoing, slight nausea at start of session that increases with activity. Session focused on gait training for tolerance to upright and dynamic postural stability, therapeutic exercise to promote BLE strengthening and muscular endurance needed for functional transfers and ambulation, and therapeutic activity for vitals monitoring and therapeutic use of self.  Pt transported to day room via [facility] for time management  and energy conservation. Pt requesting to weigh herself, completes step up to standing scale with close supervision no device, charted. Pt vitals assessed, noted below. Pt ambulates 175' with close supervision, demonstrates short stride length, improved gait speed from previous session however still ambulates slowly. Pt demonstrating improved focus on breathing mechanics during ambulation. Once pt returned to sitting reporting signfiicant nausea, requires increased time for rest break and RN notified who medicates. Pt completes 2nd trial ambulation ~260' without device, supervision and therapist bringing WC in tow in case of nausea or vomiting, with pt completing 1.5 lap around nurse's station and ambulates into personal bathroom. Pt completes 3/3 toileting tasks with distant supervision. Pt then ambulates without device back to day room with supervision and WC in tow.  Pt then completes standing therex to promote BLE strengthening including: - squats x10 - step taps 6" step x10 BLE - step ups x5 LLE, x1 RLE - marches with 2.5# ankle  weights x20 alternating BLE with BUE support on rollator - hip abduction x10 BLE 2.5# ankle weights and BUE support on rollator  During exercises with balance challenge pt becomes emotional, returned to room via [facility] for privacy and therapist uses therapeutic use of self to provide emotional support for pt. Pt requires increased time and encouragement with education on CLOF and therapeutic progression following condition with pt verbalizing understanding. Pt requests to terminate balance challenge activity at this time.  Pt ambulates with 2.5# ankle weights ~125', slow gait speed, close supervision/CGA, completed to promote BLE strengthening and tolerance to upright.  Pt returns to room and remains seated at EOB at end of session, NT present.   Vitals: Sitting: BP 111/57, HR 69 bpm Sitting after gait trial: BP 119/54, HR 73 bpm  Therapy Documentation Precautions:  Precautions Precautions: Sternal Precaution Booklet Issued: Yes (comment) Restrictions Weight Bearing Restrictions: No RUE Weight Bearing: Non weight bearing LUE Weight Bearing: Non weight bearing Other Position/Activity Restrictions: sternal precautions   Therapy/Group: Individual Therapy  Edwin Cap PT, DPT 08/29/2023, 3:51 PM

## 2023-08-29 NOTE — Progress Notes (Signed)
Reports continued pain, swelling and firmness at right thigh SV harvest site>>expected. Still with pitting edema of both ankles. Diuresed over the weekend and TRH recommends converting to oral Lasix 40 mg daily today>>will order. Will place order for RLE venous duplex per patient request. She remains on Lovenox 60 mg q HS. TED hose in place.   Hgb down one gram this am from 11/14. No overt bleeding, hematochezia or melena. Serum creatinine rising>>now 1.12. Repeat CBC and BMP in am.

## 2023-08-29 NOTE — Progress Notes (Signed)
Occupational Therapy Session Note  Patient Details  Name: Renee Ho MRN: [id number] Date of Birth: August 25, 1976  Today's Date: 08/29/2023 Session 1 OT Individual Time: 2725-3664 OT Individual Time Calculation (min): 70 min   Session 2 OT Individual Time: 1430-1500 OT Individual Time Calculation (min): 30 min    Short Term Goals: Week 1:  OT Short Term Goal 1 (Week 1): LTG=STG 2.2 ELOS  Skilled Therapeutic Interventions/Progress Updates:    Pt greeted seated EOB and agreeable to OT treatment session. Pt ambulated in room without device and supervision to collect clothing. Pt then ambulated to bathroom, voided bladder, then showered sit<>stand from tub bench with close supervision and use of grab bars. Dressing tasks completed from wc with overall set-up A. OT reviewed friction reducing bag to don TED hose with pt able to don L TED hose and L shoe, but needed OT assist for R side due to R LE pain and swelling. Pt demonstrated improved activity tolerance to complete all grooming tasks in standing today. Pt ate breakfast at EOB and we discussed dc plan, OT goals, and DME needs. Pt left seated in wc with chair alarm on, call bell in reach, and needs met.  Pain: Pain Assessment Pain Scale: 0-10 Pain Score: 7  Pain Type: Surgical pain Pain Location: Chest Pain Orientation: Anterior Pain Radiating Towards: jaw Pain Descriptors / Indicators: Aching Pain Frequency: Intermittent Pain Onset: On-going Patients Stated Pain Goal: 2 Pain Intervention(s): Medication (See eMAR) Multiple Pain Sites: No  Session 2 Pt greeted semi-reclined in bed, easy to wake and agreeable to OT treatment session. Pt completed bed mobility and ambulated without device or assistance from OT. Pt walked to the nurses station mod I to get her pain medicine. OT reminded pt of deep breathing techniques while ambulating to tub room. Practiced tub bench transfer in simulated home environment mod I. Pt reported  need to urinate and ambulated to bathroom, voided bladder, and completed peri-care and hygiene mod I. Pt returned to room and left seated EOB with needs met.  Therapy Documentation Precautions:  Precautions Precautions: Sternal Precaution Booklet Issued: Yes (comment) Restrictions Weight Bearing Restrictions: No RUE Weight Bearing: Non weight bearing LUE Weight Bearing: Non weight bearing Other Position/Activity Restrictions: sternal precautions Pain: Pain Assessment Pain Scale: 0-10 Pain Score: 7  Pain Type: Surgical pain Pain Location: Chest Pain Orientation: Anterior Pain Radiating Towards: jaw Pain Descriptors / Indicators: Aching Pain Frequency: Intermittent Pain Onset: On-going Patients Stated Pain Goal: 2 Pain Intervention(s): Medication (See eMAR) Multiple Pain Sites: No   Therapy/Group: Individual Therapy  [name] 08/29/2023, 3:06 PM

## 2023-08-30 ENCOUNTER — Telehealth (HOSPITAL_COMMUNITY): Payer: Self-pay

## 2023-08-30 DIAGNOSIS — R5381 Other malaise: Secondary | ICD-10-CM | POA: Diagnosis not present

## 2023-08-30 LAB — BASIC METABOLIC PANEL
Anion gap: 7 (ref 5–15)
BUN: 14 mg/dL (ref 6–20)
CO2: 33 mmol/L — ABNORMAL HIGH (ref 22–32)
Calcium: 8.2 mg/dL — ABNORMAL LOW (ref 8.9–10.3)
Chloride: 99 mmol/L (ref 98–111)
Creatinine, Ser: 1.13 mg/dL — ABNORMAL HIGH (ref 0.44–1.00)
GFR, Estimated: 60 mL/min — ABNORMAL LOW (ref >60)
Glucose, Bld: 124 mg/dL — ABNORMAL HIGH (ref 70–99)
Potassium: 3.9 mmol/L (ref 3.5–5.1)
Sodium: 139 mmol/L (ref 135–145)

## 2023-08-30 LAB — GLUCOSE, CAPILLARY
Glucose-Capillary: 100 mg/dL — ABNORMAL HIGH (ref 70–99)
Glucose-Capillary: 105 mg/dL — ABNORMAL HIGH (ref 70–99)
Glucose-Capillary: 120 mg/dL — ABNORMAL HIGH (ref 70–99)
Glucose-Capillary: 138 mg/dL — ABNORMAL HIGH (ref 70–99)
Glucose-Capillary: 166 mg/dL — ABNORMAL HIGH (ref 70–99)

## 2023-08-30 LAB — CBC
HCT: 27.4 % — ABNORMAL LOW (ref 36.0–46.0)
Hemoglobin: 8.6 g/dL — ABNORMAL LOW (ref 12.0–15.0)
MCH: 30 pg (ref 26.0–34.0)
MCHC: 31.4 g/dL (ref 30.0–36.0)
MCV: 95.5 fL (ref 80.0–100.0)
Platelets: 250 10*3/uL (ref 150–400)
RBC: 2.87 MIL/uL — ABNORMAL LOW (ref 3.87–5.11)
RDW: 14.2 % (ref 11.5–15.5)
WBC: 8.7 10*3/uL (ref 4.0–10.5)
nRBC: 0 % (ref 0.0–0.2)

## 2023-08-30 NOTE — Telephone Encounter (Signed)
Pt is currently in CIR.   Placed pt ppw in f/u folder for Dec.

## 2023-08-30 NOTE — Plan of Care (Signed)
  Problem: RH Balance Goal: LTG Patient will maintain dynamic standing with ADLs (OT) Description: LTG:  Patient will maintain dynamic standing balance with assist during activities of daily living (OT)  Outcome: Completed/Met   Problem: Sit to Stand Goal: LTG:  Patient will perform sit to stand in prep for activites of daily living with assistance level (OT) Description: LTG:  Patient will perform sit to stand in prep for activites of daily living with assistance level (OT) Outcome: Completed/Met   Problem: RH Bathing Goal: LTG Patient will bathe all body parts with assist levels (OT) Description: LTG: Patient will bathe all body parts with assist levels (OT) Outcome: Completed/Met   Problem: RH Dressing Goal: LTG Patient will perform lower body dressing w/assist (OT) Description: LTG: Patient will perform lower body dressing with assist, with/without cues in positioning using equipment (OT) Outcome: Completed/Met   Problem: RH Toileting Goal: LTG Patient will perform toileting task (3/3 steps) with assistance level (OT) Description: LTG: Patient will perform toileting task (3/3 steps) with assistance level (OT)  Outcome: Completed/Met   Problem: RH Toilet Transfers Goal: LTG Patient will perform toilet transfers w/assist (OT) Description: LTG: Patient will perform toilet transfers with assist, with/without cues using equipment (OT) Outcome: Completed/Met   Problem: RH Tub/Shower Transfers Goal: LTG Patient will perform tub/shower transfers w/assist (OT) Description: LTG: Patient will perform tub/shower transfers with assist, with/without cues using equipment (OT) Outcome: Completed/Met   

## 2023-08-30 NOTE — Patient Care Conference (Signed)
Inpatient RehabilitationTeam Conference and Plan of Care Update Date: 08/30/2023   Time: 10:34 AM   Patient Name: Renee Ho      Medical Record Number: [id number]  Date of Birth: [birth year] Sex: Female         Room/Bed: 4W14C/4W14C-01 Payor Info: Payor: [place] EMPLOYEE / Plan: [place] [employer] / Product Type: *No Product type* /    Admit Date/Time:  08/24/2023  5:03 PM  Primary Diagnosis:  Debility  Hospital Problems: Principal Problem:   Debility Active Problems:   Ileus [facility])    Expected Discharge Date: Expected Discharge Date: 08/31/23  Team Members Present: Physician leading conference: Dr. Elijah Birk Social Worker Present: Cecile Sheerer, LCSWA Nurse Present: Vedia Pereyra, RN PT Present: Darrold Span, PT OT Present: Kearney Hard, OT PPS Coordinator present : Fae Pippin, SLP     Current Status/Progress Goal Weekly Team Focus  Bowel/Bladder   pt continent of b/b   remain continent   pt independent in room    Swallow/Nutrition/ Hydration               ADL's   Mod I   Mod I   dc planning, endurance, generalized strengthening, self-care retraining    Mobility   modI overall   modI  barriers: pain, endurance, focus on global endurance, BLE strengthening, NMR for dynamic standing balance, gait training    Communication                Safety/Cognition/ Behavioral Observations               Pain   c/o incision pain on chest 7-10   <3 pain score   assess qshift and prn    Skin   sterum incision ota, two smaller incisions below that, ota with steri strips   maintain skin integrity  assess qshift and prn      Discharge Planning:  D/c to home with husband, and support from various family members to assist with care needs. SW will confirm there are no barriers to discharge.   Team Discussion: Debility. Urgency with voiding.  Constipation improving.  Pain managed with PRN and scheduled medications. Right  leg incision with glue, no drainage. Incision to chest without drainage. Drain removal site with some drainage noted. Addressing volume control. Hbg stable. Swelling to RLE. Tolerating room air. Sternal precautions. Tolerating carb-mod diet.  AC/HS. Wears CPAP.  Patient on target to meet rehab goals: yes, Mod I in room, discharge date 08/31/23  *See Care Plan and progress notes for long and short-term goals.   Revisions to Treatment Plan:    Teaching Needs: Completed IV lasix and transitioned to PO.  Venous duplex okay. TED hose. Ween O2. Monitor labs/ VS  Current Barriers to Discharge: Decreased caregiver support, Home enviroment access/layout, Wound care, and Weight bearing restrictions  Possible Resolutions to Barriers: Family education Able to facilitate stairs into home Able to manage wound care independently Adhere to weight bearing restrictions Order recommended DME     Medical Summary Current Status: medically complicated by hypotension, poor pain control, constipation, RLE edema, volume status, nausea, AKI and anemia  Barriers to Discharge: Cardiac Complications;Hypotension;Morbid Obesity;Medical stability;Self-care education;Uncontrolled Diabetes;Uncontrolled Pain;Volume Overload   Possible Resolutions to [employer]: titrate pain medications to minimum tolerated doses for function, titrate bowel medications, monitor vitals and adjust BP regimen as approrpiate/diuretics as needed for volume control, monitor labs   Continued Need for Acute Rehabilitation Level of Care: The patient requires daily medical management  by a physician with specialized training in physical medicine and rehabilitation for the following reasons: Direction of a multidisciplinary physical rehabilitation program to maximize functional independence : Yes Medical management of patient stability for increased activity during participation in an intensive rehabilitation regime.: Yes Analysis of  laboratory values and/or radiology reports with any subsequent need for medication adjustment and/or medical intervention. : Yes   I attest that I was present, lead the team conference, and concur with the assessment and plan of the team.   Jearld Adjutant 08/30/2023, 2:00 PM

## 2023-08-30 NOTE — Discharge Instructions (Addendum)
Inpatient Rehab Discharge Instructions  [facility] Discharge date and time: 08/31/2023  Activities/Precautions/ Functional Status: Activity: no lifting, driving, or strenuous exercise until cleared by MD Diet: cardiac diet Wound Care: keep wound clean and dry Functional status:  ___ No restrictions     ___ Walk up steps independently ___ 24/7 supervision/assistance   ___ Walk up steps with assistance _x__ Intermittent supervision/assistance  ___ Bathe/dress independently ___ Walk with walker     ___ Bathe/dress with assistance ___ Walk Independently    ___ Shower independently ___ Walk with assistance    __x_ Shower with assistance _x__ No alcohol     ___ Return to work/school ________  Special Instructions:  No driving, alcohol consumption or tobacco use.  Recommend checking fingerstick blood sugars four (4) times daily and record. Bring this information with you to follow-up appointment with PCP.  Recommend daily BP measurement in same arm and record time of day. Bring this information with you to follow-up appointment with PCP.   COMMUNITY REFERRALS UPON DISCHARGE:    *Be sure to follow-up with cardiology to discuss cardiac rehab referral. *  Medical Equipment/Items Ordered:rollator and tub transfer bench                                                 Agency/Supplier: [employer] [phone]    My questions have been answered and I understand these instructions. I will adhere to these goals and the provided educational materials after my discharge from the hospital.  Patient/Caregiver Signature _______________________________ Date __________  Clinician Signature _______________________________________ Date __________  Please bring this form and your medication list with you to all your follow-up doctor's appointments.

## 2023-08-30 NOTE — Consult Note (Signed)
Neuropsychological Consultation Comprehensive Inpatient Rehab   Patient:   Renee Ho   DOB:   19-Jul-1976  MR Number:  [id number]  Location:  [facility] [address] CENTER [address] [place] [address] Dept: [phone] Loc: [phone]           Date of Service:   08/30/2023  Start Time:   2 PM End Time:   3 PM  Provider/Observer:  Arley Phenix, Psy.D.       Clinical Neuropsychologist       Billing Code/Service: [phone]  Reason for Service:    Renee Ho is a 47 year old female referred for neuropsychological consultation during her ongoing admission and for comprehensive inpatient rehabilitation unit.  Patient had begun having issues with fatigue, lethargy and what she felt was heartburn-like symptoms over period of 2 days and on 07/23/2023 presented to ED with significant chest pain and jaw pain.  Patient initially given a stent and discharged with consideration of CABG if failed medically.  Patient continued to be symptomatic with shortness of breath and exercise intolerance and referred to cardiothoracic surgery.  Prior to that patient presented again to ED on 08/14/2023 with chest pain and ultimately underwent cardiothoracic surgery for arch artery bypass grafting x 4.  Patient had continued issues with pain and weakness with debility and admitted onto CIR due to functional declines secondary to CAD.  During the visit today, the patient was awake and alert sitting up on the edge of her bed in good spirits with her husband at bedside.  Patient well aware of procedures around the unit and has experience here and reports that everyone has been "wonderful" and making sure she is well-informed of the entire process of her recovery.  Patient remains motivated with therapy.  Patient has been on CPAP for roughly 15 years and is very diligent and compliant with his usage and uses that even when taking  naps.  She is continued with CPAP during her admission.  Patient reports that her mood is positive and she is noting gains with therapy and expects to continue therapy postdischarge.  HPI for the current admission:    HPI: Renee Ho is a 47 year old female with CAD who underwent CABG time 4 vessels by dr. Dorris Fetch on 08/19/2023. She had previously undergone stent placement to LCX on 07/23/2023 after presenting to the ED with sub-sternal chest pain. LVEF was 60-65% with moderate symmetric hypertrophy of septal segment and grad I diastolic dysfunction. Discharged on DAPT and to consider CABG if failed medically. Follows with Dr. Odis Hollingshead. She continued to be symptomatic with shortness of breath and exercise intolerance. Referral make to cardiothoracic surgery; however, she presented to the ED on 08/14/2023 with chest pain. Lab work unremarkable. EKG and troponins normal. PMH includes active sleep apnea on CPAP, polycystic ovarian syndrome, diabetes mellitus, hyper cholesterolemia, hypertension, depression/anxiety. History of tobacco abuse recently quit.  She was admitted by [facility] and cardiology consulted.  She continued to have exertional chest pain and dyspnea.  CRP was negative and no pericarditis.  Her BNP was mildly elevated at 119.  Medical management was limited due to low normal blood pressure and heart rate as well as dizziness.  She underwent cardiothoracic evaluation on 11/05 and agreed to proceed with CABG for residual LAD and diagonal disease.  She was transitioned from Brilinta to cangrelor intravenously.  On 11/08, she underwent coronary artery bypass grafting x 4 with vein harvest from  right thigh. Postoperatively she had low BP and could not tolerate Lasix for diuresis. Poor PO intake. Reportedly had had no bowel movement in 11 days. Episode of SVT 11/12 while toileting lasting about 20 minutes. Cardiology re-consulted on 11/12.  Unable to tolerate beta-blocker dosage due to low blood  pressure.  She was started on amiodarone with plans to continue 200 mg twice daily for 1 week before going to maintenance dose of 200 mg daily.  Further episodes of SVT.  Reported BM on 11/12.  Now on aspirin and Plavix.  She reports pain and weakness in her left shoulder since her surgery.  She also has a burning sensation around her left shoulder.  The patient requires inpatient medicine and rehabilitation evaluations and services for ongoing dysfunction secondary to CAD.  Medical History:   Past Medical History:  Diagnosis Date   Depression    Diabetes mellitus    Diabetes mellitus without complication (HCC)    Diverticula of intestine    High cholesterol    Hypertension    OSA on CPAP    CPAP pressure= 15   Polycystic ovarian syndrome    Sleep apnea    on CPAP with pressure setting= 15         Patient Active Problem List   Diagnosis Date Noted   Ileus (HCC) 08/26/2023   Debility 08/24/2023   Coronary artery disease 08/19/2023   Elevated brain natriuretic peptide (BNP) level 08/16/2023   Exertional dyspnea 08/15/2023   Angina pectoris (HCC) 08/14/2023   Unstable angina (HCC) 08/14/2023   S/P drug eluting coronary stent placement 07/24/2023   Coronary artery disease involving native coronary artery of native heart with unstable angina pectoris (HCC) 07/24/2023   Acute coronary syndrome (HCC) 07/23/2023   DM2 (diabetes mellitus, type 2) (HCC) 07/23/2023   Dyslipidemia 07/23/2023   Hypertriglyceridemia 07/23/2023   NSTEMI (non-ST elevated myocardial infarction) (HCC) 07/23/2023   Leukocytosis 07/23/2023   Polycythemia 07/23/2023   Coronary atherosclerosis due to calcified coronary lesion 07/23/2023   Atherosclerosis of aorta (HCC) 07/23/2023   Cigarette smoker 07/23/2023   Eye pain, bilateral 03/22/2023   Visual changes 03/22/2023   Bacterial conjunctivitis 03/22/2023   Breast pain, right 12/02/2022   Cutaneous candidiasis 12/02/2022   Acute pancreatitis 06/19/2022    Class 3 severe obesity due to excess calories with serious comorbidity and body mass index (BMI) of 40.0 to 44.9 in adult (HCC) 06/19/2022   Tobacco abuse 06/19/2022   HLD (hyperlipidemia) 04/14/2020   Lumbar radiculopathy 08/09/2019   Hyperglycemia 12/22/2016   Injury of foot, left 07/18/2013   Hypertension associated with diabetes (HCC) 09/18/2012   Morbid obesity (HCC) 09/18/2012   S/P hysterectomy 06/20/2012   H/O colonoscopy with polypectomy 06/20/2012   Depression 12/30/2011   Plantar fasciitis 04/22/2011   Pure hypertriglyceridemia 02/21/2008   Type 2 diabetes mellitus with hyperglycemia, with long-term current use of insulin (HCC) 02/24/2007   POLYCYSTIC OVARIAN DISEASE 02/24/2007   Essential hypertension 02/24/2007   OSA on CPAP 02/24/2007    Behavioral Observation/Mental Status:   Renee Ho  presents as a 47 y.o.-year-old Right handed Caucasian Female who appeared her stated age. her dress was Appropriate and she was Well Groomed and her manners were Appropriate to the situation.  her participation was indicative of Appropriate behaviors.  There were physical disabilities noted.  she displayed an appropriate level of cooperation and motivation.    Interactions:    Active Appropriate  Attention:   within normal limits and attention span  and concentration were age appropriate  Memory:   within normal limits; recent and remote memory intact  Visuo-spatial:   not examined  Speech (Volume):  normal  Speech:   normal; normal  Thought Process:  Coherent and Relevant  Coherent, Linear, and Logical  Though Content:  WNL; not suicidal and not homicidal  Orientation:   person, place, time/date, and situation  Judgment:   Good  Planning:   Good  Affect:    Appropriate  Mood:    Euthymic  Insight:   Good  Intelligence:   normal  Psychiatric History:  No prior psychiatric history  Family Med/Psych History:  Family History  Problem Relation Age of  Onset   Cancer Mother        breast   Mental illness Mother    Depression Mother    Hyperlipidemia Mother    Breast cancer Mother    Healthy Father    Hyperlipidemia Father    Diabetes Maternal Grandmother    Hyperlipidemia Maternal Grandmother    Cancer Maternal Grandfather        unknown type   Depression Maternal Grandfather    Hyperlipidemia Maternal Grandfather    Stroke Paternal Grandmother    Hyperlipidemia Paternal Grandmother    Hyperlipidemia Paternal Grandfather    Sleep apnea Neg Hx    Impression/DX:   Renee Ho is a 47 year old female referred for neuropsychological consultation during her ongoing admission and for comprehensive inpatient rehabilitation unit.  Patient had begun having issues with fatigue, lethargy and what she felt was heartburn-like symptoms over period of 2 days and on 07/23/2023 presented to ED with significant chest pain and jaw pain.  Patient initially given a stent and discharged with consideration of CABG if failed medically.  Patient continued to be symptomatic with shortness of breath and exercise intolerance and referred to cardiothoracic surgery.  Prior to that patient presented again to ED on 08/14/2023 with chest pain and ultimately underwent cardiothoracic surgery for arch artery bypass grafting x 4.  Patient had continued issues with pain and weakness with debility and admitted onto CIR due to functional declines secondary to CAD.  During the visit today, the patient was awake and alert sitting up on the edge of her bed in good spirits with her husband at bedside.  Patient well aware of procedures around the unit and has experience here and reports that everyone has been "wonderful" and making sure she is well-informed of the entire process of her recovery.  Patient remains motivated with therapy.  Patient has been on CPAP for roughly 15 years and is very diligent and compliant with his usage and uses that even when taking naps.  She  is continued with CPAP during her admission.  Patient reports that her mood is positive and she is noting gains with therapy and expects to continue therapy postdischarge.          Electronically Signed   _______________________ Arley Phenix, Psy.D. Clinical Neuropsychologist

## 2023-08-30 NOTE — Progress Notes (Signed)
Occupational Therapy Discharge Summary  Patient Details  Name: Renee Ho MRN: [id number] Date of Birth: 1975/12/31  Date of Discharge from OT service:August 30, 2023  Today's Date: 08/30/2023 OT Individual Time: 1100-1200 OT Individual Time Calculation (min): 60 min   OT treatment session focused on increased independence with BADL tasks. Pt able to access dresser drawers, collect clothing, ambulate to bathroom without device all without assist from OT. Bathing/dressing completed mod I with increased time. See functional navigator for further details.   Patient has met 7 of 7 long term goals due to improved activity tolerance, improved balance, postural control, and ability to compensate for deficits.  Patient to discharge at overall Modified Independent level.  Patient's care partner is independent to provide the necessary physical assistance at discharge for higher level iADL tasksk.    Reasons goals not met: n/a  Recommendation:  Patient will benefit from ongoing skilled OT services in outpatient setting to continue to advance functional skills in the area of  cardiac rehab .  Equipment: Rollator walker, tub transfer bench  Reasons for discharge: treatment goals met and discharge from hospital  Patient/family agrees with progress made and goals achieved: Yes  OT Discharge Precautions/Restrictions  Precautions Precautions: Sternal Precaution Booklet Issued: Yes (comment) Precaution Comments: watch HR and RPE scale, check SpO2 Restrictions Weight Bearing Restrictions: No Other Position/Activity Restrictions: sternal precautions Pain  6/10 incision pain, rest and positioned  ADL ADL Eating: Independent Grooming: Independent Upper Body Bathing: Modified independent Lower Body Bathing: Modified independent Upper Body Dressing: Modified independent (Device) Lower Body Dressing: Modified independent Toileting: Modified independent Toilet Transfer:  Modified independent Tub/Shower Transfer: Modified independent Cognition Cognition Overall Cognitive Status: Within Functional Limits for tasks assessed Arousal/Alertness: Awake/alert Orientation Level: Person;Place;Situation Person: Oriented Place: Oriented Situation: Oriented Memory: Appears intact Safety/Judgment: Appears intact Brief Interview for Mental Status (BIMS) Repetition of Three Words (First Attempt): 3 Temporal Orientation: Year: Correct Temporal Orientation: Month: Accurate within 5 days Temporal Orientation: Day: Correct Recall: "Sock": Yes, no cue required Recall: "Blue": Yes, no cue required Recall: "Bed": Yes, no cue required BIMS Summary Score: 15 Sensation Sensation Light Touch: Impaired by gross assessment Light Touch Impaired Details: Impaired LUE;Impaired RUE (reports tingling in 3 digits on RUE, 4 digits on LUE) Additional Comments: improved since eval Coordination Gross Motor Movements are Fluid and Coordinated: Yes Fine Motor Movements are Fluid and Coordinated: Yes Coordination and Movement Description: generalized weakness and endurance deficits, improved since eval Motor  Motor Motor: Within Functional Limits Motor - Discharge Observations: endurance deficits, improved since eval Mobility  Bed Mobility Bed Mobility: Sit to Supine;Supine to Sit Supine to Sit: Independent Sit to Supine: Independent Transfers Sit to Stand: Independent Stand to Sit: Independent  Trunk/Postural Assessment  Cervical Assessment Cervical Assessment: Within Functional Limits Thoracic Assessment Thoracic Assessment: Within Functional Limits Lumbar Assessment Lumbar Assessment: Within Functional Limits Postural Control Postural Control: Within Functional Limits  Balance Balance Balance Assessed: Yes Standardized Balance Assessment Standardized Balance Assessment: Timed Up and Go Test Timed Up and Go Test TUG: Normal TUG Normal TUG (seconds): 10.87 Static  Sitting Balance Static Sitting - Balance Support: Feet supported Static Sitting - Level of Assistance: 7: Independent Dynamic Sitting Balance Dynamic Sitting - Balance Support: Feet unsupported;Feet supported;No upper extremity supported Dynamic Sitting - Level of Assistance: 7: Independent Static Standing Balance Static Standing - Balance Support: During functional activity Static Standing - Level of Assistance: 6: Modified independent (Device/Increase time) Dynamic Standing Balance Dynamic Standing - Balance Support: During functional activity  Dynamic Standing - Level of Assistance: 6: Modified independent (Device/Increase time) Extremity/Trunk Assessment RUE Assessment RUE Assessment: Not tested General Strength Comments: due to sternal precautions LUE Assessment LUE Assessment: Not tested General Strength Comments: due to sternal precautions   Renee Ho Renee Ho 08/30/2023, 11:33 AM

## 2023-08-30 NOTE — Plan of Care (Signed)
  Problem: RH Bed Mobility Goal: LTG Patient will perform bed mobility with assist (PT) Description: LTG: Patient will perform bed mobility with assistance, with/without cues (PT). Outcome: Completed/Met   Problem: RH Bed to Chair Transfers Goal: LTG Patient will perform bed/chair transfers w/assist (PT) Description: LTG: Patient will perform bed to chair transfers with assistance (PT). Outcome: Completed/Met   Problem: RH Car Transfers Goal: LTG Patient will perform car transfers with assist (PT) Description: LTG: Patient will perform car transfers with assistance (PT). Outcome: Completed/Met   Problem: RH Ambulation Goal: LTG Patient will ambulate in controlled environment (PT) Description: LTG: Patient will ambulate in a controlled environment, # of feet with assistance (PT). Outcome: Completed/Met Goal: LTG Patient will ambulate in home environment (PT) Description: LTG: Patient will ambulate in home environment, # of feet with assistance (PT). Outcome: Completed/Met   Problem: RH Stairs Goal: LTG Patient will ambulate up and down stairs w/assist (PT) Description: LTG: Patient will ambulate up and down # of stairs with assistance (PT) Outcome: Completed/Met

## 2023-08-30 NOTE — Progress Notes (Signed)
Physical Therapy Discharge Summary  Patient Details  Name: Renee Ho MRN: [id number] Date of Birth: 05/23/1976  Date of Discharge from PT service:August 30, 2023  Today's Date: 08/30/2023 PT Individual Time: 0918-1028 PT Individual Time Calculation (min): 70 min    Patient has met 6 of 6 long term goals due to improved activity tolerance, improved balance, improved postural control, and increased strength.  Patient to discharge at an ambulatory level Modified Independent.   Patient's care partner is independent to provide the necessary physical assistance at discharge.  Reasons goals not met: N/A  Recommendation:  Patient will benefit from ongoing skilled PT services in outpatient setting to continue to advance safe functional mobility, address ongoing impairments in activity tolerance, BLE strength, dynamic standing balance, and minimize fall risk.  Equipment: rollator  Reasons for discharge: treatment goals met and discharge from hospital  Patient/family agrees with progress made and goals achieved: Yes  PT Discharge Skilled Treatment Interventions/Progress Updates Pt presents in room seated EOB, agreeable to PT. Pt reporting pain at incision site and sternum as well as increased pain in RLE at incision site. Session focused on discharge planning, gait training for tolerance to upright, therapeutic exercise for pain management and prescription of HEP to complete at DC. Pt SpO2 monitored throughout session with activity and at rest with pt demonstrating SpO2 >90% on room air.  Pt ambulates from room to main gym I/modI without device, gait speed slowed secondary to pain in RLE but no postural sway noted. Pt takes seated rest break and pt provided with soft tissue mobilization for bilateral upper trapezius musculature to decrease pain and improve breathing mechanics. Pt provided with education on relaxation and meditation for managing pain and decreasing muscle guarding  with pt verbalizing understanding.  Pt completes up/down 12 steps with LHR modI, step to gait pattern ascending leading with RLE, descending leading with LLE, takes seated rest break following task due to fatigue. Pt then ambulates from main gym to ortho gym to complete car transfer, car elevated to anticipated car height at DC, pt completes with increased time, modI. Pt completes ambulation up ramp and over mulch without device modI increased time to complete. Pt ambulates to nurse's station to take seated rest break.  Pt then ambulates to day room modI, able to maintain conversation without getting out of breath. Pt comes to sitting in armchair in day room. Pt takes seated rest break then completes TUG for assessment of falls risk with trials indicated below, average score of 10.87 seconds indicating decreased risk for falls. Pt does report dizziness with quick turns and requires extended seated rest breaks between trials to recover. Trial 1: 13.76 seconds Trial 2: 10.06 seconds Trial 3: 8.81 seconds  Pt completes one set of the following exercises, educated on proper muscular activation, technique, and safety and provided with handout to complete as HEP at discharge:   Access Code: [email or web address] URL: [email or web address] Date: 08/30/2023 Prepared by: Edwin Cap  Exercises - Seated Shoulder Shrugs  - 1 x daily - 7 x weekly - 5 reps - 2 second hold - Seated Scapular Retraction  - 1 x daily - 7 x weekly - 5 reps - 1-2 second hold - Seated Small Neck Circles  - 1 x daily - 7 x weekly - 5 reps - Standing March with Counter Support  - 2 x daily - 7 x weekly - 1 sets - 20 reps - Mini Squat with Counter Support  - 2 x daily - 7 x  weekly - 1 sets - 10 reps - Heel Raises with Counter Support  - 2 x daily - 7 x weekly - 1 sets - 10 reps - Standing Hip Abduction with Counter Support  - 2 x daily - 7 x weekly - 1 sets - 10 reps  Pt requests to use restroom during session, ambulates  to/from bathroom in day room modI and completes 3/3 toileting tasks modI. Pt continent of bowel, charted.  Pt ambulates back to room at end of session modI and comes to sitting on EOB where she remains with all needs within reach, call light in place, RN present at end of session.   Precautions/Restrictions Precautions Precautions: Sternal Precaution Booklet Issued: Yes (comment) Precaution Comments: watch HR and RPE scale, check SpO2 Restrictions Weight Bearing Restrictions: No Other Position/Activity Restrictions: sternal precautions Pain Interference Pain Interference Pain Effect on Sleep: 4. Almost constantly Pain Interference with Therapy Activities: 3. Frequently Pain Interference with Day-to-Day Activities: 3. Frequently Cognition Overall Cognitive Status: Within Functional Limits for tasks assessed Arousal/Alertness: Awake/alert Orientation Level: Oriented X4 Memory: Appears intact Safety/Judgment: Appears intact Sensation Sensation Light Touch: Impaired by gross assessment Light Touch Impaired Details: Impaired LUE;Impaired RUE (reports tingling in 3 digits on RUE, 4 digits on LUE) Additional Comments: improved since eval Coordination Gross Motor Movements are Fluid and Coordinated: Yes Fine Motor Movements are Fluid and Coordinated: Yes Coordination and Movement Description: generalized weakness and endurance deficits, improved since eval Motor  Motor Motor: Within Functional Limits Motor - Discharge Observations: endurance deficits, improved since eval  Mobility Bed Mobility Bed Mobility: Sit to Supine;Supine to Sit Supine to Sit: Independent Sit to Supine: Independent Transfers Transfers: Sit to Stand;Stand to Sit Sit to Stand: Independent Stand to Sit: Independent Stand Pivot Transfers: Independent Transfer (Assistive device): None Locomotion  Gait Ambulation: Yes Gait Assistance: Independent Gait Distance (Feet): 175 Feet Assistive device:  None Gait Gait: Yes Gait Pattern: Impaired Gait Pattern: Decreased stride length Gait velocity: decreased, improved since eval Stairs / Additional Locomotion Stairs: Yes Stairs Assistance: Independent Stair Management Technique: One rail Left Number of Stairs: 12 Height of Stairs: 6 Ramp: Independent Curb: Independent Pick up small object from the floor assist level: Independent Wheelchair Mobility Wheelchair Mobility: No  Trunk/Postural Assessment  Cervical Assessment Cervical Assessment: Within Functional Limits Thoracic Assessment Thoracic Assessment: Within Functional Limits Lumbar Assessment Lumbar Assessment: Within Functional Limits Postural Control Postural Control: Within Functional Limits  Balance Balance Balance Assessed: Yes Standardized Balance Assessment Standardized Balance Assessment: Timed Up and Go Test Timed Up and Go Test TUG: Normal TUG Normal TUG (seconds): 10.87 Static Sitting Balance Static Sitting - Balance Support: Feet supported Static Sitting - Level of Assistance: 7: Independent Dynamic Sitting Balance Dynamic Sitting - Balance Support: Feet unsupported;Feet supported;No upper extremity supported Dynamic Sitting - Level of Assistance: 7: Independent Static Standing Balance Static Standing - Balance Support: During functional activity Static Standing - Level of Assistance: 6: Modified independent (Device/Increase time) Dynamic Standing Balance Dynamic Standing - Balance Support: During functional activity Dynamic Standing - Level of Assistance: 6: Modified independent (Device/Increase time) Extremity Assessment  RLE Assessment RLE Assessment: Exceptions to [facility] General Strength Comments: grossly 4/5, limited due to pain at incision site LLE Assessment LLE Assessment: Within Functional Limits General Strength Comments: grossly 5/5   Edwin Cap PT, DPT 08/30/2023, 12:13 PM

## 2023-08-30 NOTE — Progress Notes (Signed)
Occupational Therapy Session Note  Patient Details  Name: Renee Ho MRN: [id number] Date of Birth: 1975-12-18  Today's Date: 08/30/2023 OT Individual Time: 0830-0900 OT Individual Time Calculation (min): 30 min    Short Term Goals: Week 1:  OT Short Term Goal 1 (Week 1): LTG=STG 2.2 ELOS Week 2:     Skilled Therapeutic Interventions/Progress Updates:    1:1 Pt sitting EOB finishing breakfast. Pt talking about her progress and what and when returning to work would look like. Pt emotional in conversation. Pt ambulated to the bathroom and performed toileting mod I. Pt ambulated to the dayroom at a very slow speed with O2 >86% on RA. Pt does benefit from verbal cues for pursed lip breathing with activity. Pt performed 2 min on the Kinetron with low resistance and then transitioned into standing after prolonged seated break. Performed marching in standing for 2 min with UE support and encouragement. Pt then transitioned to laying down on the mat. Kinesotaped her right LE  behind her knee and on the posterior aspect of her thigh for swelling/ pain. Pt left sitting EOB at end of session   Therapy Documentation Precautions:  Precautions Precautions: Sternal Precaution Booklet Issued: Yes (comment) Precaution Comments: watch HR and RPE scale, check SpO2 Restrictions Weight Bearing Restrictions: No RUE Weight Bearing: Non weight bearing LUE Weight Bearing: Non weight bearing Other Position/Activity Restrictions: sternal precautions  Pain: Pain Assessment Pain Scale: 0-10 Pain Score: 5  requested pain patch and meds if she can have them    Therapy/Group: Individual Therapy  Roney Mans [facility] 08/30/2023, 3:30 PM

## 2023-08-30 NOTE — Progress Notes (Signed)
PROGRESS NOTE   Subjective/Complaints:  HgB stable, with ongoing mild Cr increase butr BUN WNL. Other labs normal. Complaining of 8/10 surgical pain in the right lower extremity, but tolerable with current regimen.  Had shower this a.m., left chest tube area dehisced, mild amount of bleeding.   ROS: + R leg pain and edema -stable, +Constipation - improved. Patient denies fever, rash, sore throat, blurred vision, dizziness, nausea, vomiting, diarrhea, cough, shortness of breath or chest pain, joint or back/neck pain, headache, or mood change.     Objective:   VAS [place] LOWER EXTREMITY VENOUS (DVT)  Result Date: 08/29/2023  Lower Venous DVT Study Patient Name:  Renee SINER  Date of Exam:   08/29/2023 Medical Rec #: [id number]                   Accession #:    [id number] Date of Birth: 02/07/1976                    Patient Gender: F Patient Age:   47 years Exam Location:  [facility] Procedure:      VAS [place] LOWER EXTREMITY VENOUS (DVT) Referring Phys: Wendi Maya --------------------------------------------------------------------------------  Indications: Swelling, Edema, and SOB.  Comparison Study: No prior exam. Performing Technologist: Fernande Bras  Examination Guidelines: A complete evaluation includes B-mode imaging, spectral Doppler, color Doppler, and power Doppler as needed of all accessible portions of each vessel. Bilateral testing is considered an integral part of a complete examination. Limited examinations for reoccurring indications may be performed as noted. The reflux portion of the exam is performed with the patient in reverse Trendelenburg.  +---------+---------------+---------+-----------+----------+--------------+ RIGHT    CompressibilityPhasicitySpontaneityPropertiesThrombus Aging +---------+---------------+---------+-----------+----------+--------------+ CFV      Full           Yes       Yes                                 +---------+---------------+---------+-----------+----------+--------------+ SFJ      Full                                                        +---------+---------------+---------+-----------+----------+--------------+ FV Prox  Full                                                        +---------+---------------+---------+-----------+----------+--------------+ FV Mid   Full                                                        +---------+---------------+---------+-----------+----------+--------------+ FV DistalFull                                                        +---------+---------------+---------+-----------+----------+--------------+  PFV      Full                                                        +---------+---------------+---------+-----------+----------+--------------+ POP      Full           Yes      Yes                                 +---------+---------------+---------+-----------+----------+--------------+ PTV      Full                                                        +---------+---------------+---------+-----------+----------+--------------+ PERO     Full                                                        +---------+---------------+---------+-----------+----------+--------------+ Fluid collection noted at site of GSV harvest in upper thigh measuring: 7.4 x 1.1 x 3.3 cm and lower thigh measuring: 6.9 x 0.7 x 3.1 cm  +---------+---------------+---------+-----------+----------+--------------+ LEFT     CompressibilityPhasicitySpontaneityPropertiesThrombus Aging +---------+---------------+---------+-----------+----------+--------------+ CFV      Full           Yes      Yes                                 +---------+---------------+---------+-----------+----------+--------------+ SFJ      Full                                                         +---------+---------------+---------+-----------+----------+--------------+ FV Prox  Full                                                        +---------+---------------+---------+-----------+----------+--------------+ FV Mid   Full                                                        +---------+---------------+---------+-----------+----------+--------------+ FV DistalFull                                                        +---------+---------------+---------+-----------+----------+--------------+ PFV      Full                                                        +---------+---------------+---------+-----------+----------+--------------+  POP      Full           Yes      Yes                                 +---------+---------------+---------+-----------+----------+--------------+ PTV      Full                                                        +---------+---------------+---------+-----------+----------+--------------+ PERO     Full                                                        +---------+---------------+---------+-----------+----------+--------------+     Summary: BILATERAL: - No evidence of deep vein thrombosis seen in the lower extremities, bilaterally. -No evidence of popliteal cyst, bilaterally.   *See table(s) above for measurements and observations. Electronically signed by Gerarda Fraction on 08/29/2023 at 4:25:55 PM.    Final    Recent Labs    08/29/23 0624 08/30/23 0637  WBC 8.1 8.7  HGB 8.8* 8.6*  HCT 27.9* 27.4*  PLT 242 250    Recent Labs    08/29/23 0624 08/30/23 0637  NA 136 139  K 4.0 3.9  CL 97* 99  CO2 31 33*  GLUCOSE 142* 124*  BUN 13 14  CREATININE 1.12* 1.13*  CALCIUM 8.4* 8.2*    Intake/Output Summary (Last 24 hours) at 08/30/2023 0924 Last data filed at 08/29/2023 1832 Gross per 24 hour  Intake 800 ml  Output --  Net 800 ml        Physical Exam: Vital Signs Blood pressure 97/71, pulse 60,  temperature 97.7 F (36.5 C), resp. rate 17, height 5\' 5"  (1.651 m), weight 118.3 kg, SpO2 100%.  Constitutional: No distress . Vital signs reviewed.  Sitting in wheelchair in bedroom. HEENT: NCAT, EOMI, oral membranes moist Neck: supple Cardiovascular: RRR without murmur. No JVD  . + RLE edema 2+ -wrapped in Ace bandage.  Respiratory/Chest: CTA Bilaterally without wheezes or rales. Normal effort    GI/Abdomen: BS +, non-tender, non-distended Ext: no clubbing, cyanosis, or LE tr to 1+ edema Psych: pleasant and cooperative  Skin:  Sternal incision midline CDI, well-approximated with mild erythema still around borders-improving Drain sites -both areas have dehisced in the middle with very mild bleeding; secured with Steri-Strips today Right leg donor site  - No current drainage.  Appears to be healing. Bruising on R medial thigh - slightly tense, TTP, no warmth or change in contour -bruises migrating inferiorly somewhat          Neuro: Alert and oriented x 4, apparent deficits Strength tested limited by sternal precautions in upper extremities; antigravity and against resistance bilaterally Bilateral lower extremity strength 4+ out of 5 Decreased sensation over lateral left shoulder  Musculoskeletal: Right lower extremity at proximal thigh and chest wall tenderness present  Assessment/Plan: 1. Functional deficits which require 3+ hours per day of interdisciplinary therapy in a comprehensive inpatient rehab setting. Physiatrist is providing close team supervision and 24 hour management of active medical problems listed below. Physiatrist and rehab  team continue to assess barriers to discharge/monitor patient progress toward functional and medical goals  Care Tool:  Bathing    Body parts bathed by patient: Abdomen, Chest, Left arm, Right arm, Buttocks, Front perineal area, Right upper leg, Left upper leg, Left lower leg, Face, Right lower leg         Bathing assist Assist  Level: Contact Guard/Touching assist     Upper Body Dressing/Undressing Upper body dressing   What is the patient wearing?: Pull over shirt    Upper body assist Assist Level: Contact Guard/Touching assist    Lower Body Dressing/Undressing Lower body dressing      What is the patient wearing?: Pants, Underwear/pull up     Lower body assist Assist for lower body dressing: Minimal Assistance - Patient > 75%     Toileting Toileting    Toileting assist Assist for toileting: Contact Guard/Touching assist     Transfers Chair/bed transfer  Transfers assist     Chair/bed transfer assist level: Independent     Locomotion Ambulation   Ambulation assist      Assist level: Supervision/Verbal cueing Assistive device: Hand held assist Max distance: 250'   Walk 10 feet activity   Assist     Assist level: Independent Assistive device: No Device   Walk 50 feet activity   Assist    Assist level: Supervision/Verbal cueing Assistive device: No Device    Walk 150 feet activity   Assist    Assist level: Supervision/Verbal cueing Assistive device: No Device    Walk 10 feet on uneven surface  activity   Assist Walk 10 feet on uneven surfaces activity did not occur: Safety/medical concerns   Assist level: Supervision/Verbal cueing     Wheelchair     Assist Is the patient using a wheelchair?: Yes Type of Wheelchair: Manual    Wheelchair assist level: Dependent - Patient 0%      Wheelchair 50 feet with 2 turns activity    Assist        Assist Level: Dependent - Patient 0%   Wheelchair 150 feet activity     Assist      Assist Level: Dependent - Patient 0%   Blood pressure 97/71, pulse 60, temperature 97.7 F (36.5 C), resp. rate 17, height 5\' 5"  (1.651 m), weight 118.3 kg, SpO2 100%.  Medical Problem List and Plan: 1. 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 sternal precautions              -patient may shower, cover incisions please             -ELOS/Goals: 7 to 10 days, mod I to supervision with PT and OT - DC goal 11/20             -Continue CIR therapies including PT, OT    - 11/19: Mod I for all cares, oxygen staying stable, working on pain management and endurance mostly.    2.  Antithrombotics: -DVT/anticoagulation:  Pharmaceutical: Lovenox 60 mg q HS  BL LE Duplex 11/18 negative             -antiplatelet therapy: Aspirin and Plavix    3. Pain Management: Tylenol, oxycodone, tramadol as needed   - Add Tylenol 1000 mg scheduled, robaxin 750 mg TID   - Add lido patch over upper sternum for localized pain, as incision currently well approximated without drainage  11/15: Dc oxycodone, DC robaxin, increase tramadol to 50-100 mg Q6H PRN, add dilaudid  0.5 mg once daily PRN IV for breakthrough to limit pill and opiate burden --> resume prior regimen    11/17 pt satisfied with current regimen  11/18: Using 10 mg consistently; can DC tramadol at discharge and wean to 5 mg QID to off over 2 weeks on discharge   4. Mood/Behavior/Sleep: LCSW to evaluate and provide emotional support             -continue Cymbalta 90 mg daily             -continue melatonin 3 mg q HS --> increase to 5 mg scheduled 2000, and add trazodone 50 mg PRN             -antipsychotic agents: n/a  11/15: DC melatonin and trazodone to reduce pill burden --> resumed with melatonin 5 mg scheduled; trazodone 75 mg PRN   - Sleeping well   5. Neuropsych/cognition: This patient is capable of making decisions on her own behalf.   6. Skin/Wound Care: Routine skin care checks   - Sternal incision healing well   - R groin vein harvesting site closed   - Bilateral drain sutures remain intact - CTS APP to removed on 11/18   - 11/18: R drain site open, bleeding after suture removal; cover  11-19: Both sides to his, secured with Steri-Strips.  No further bleeding once placed.  Monitor and follow-up with CT  surgery.   7. Fluids/Electrolytes/Nutrition: Routine Is and Os and follow-up chemistries             -continue Klor-con 20 mEq daily and follow-up BMP   - 11/17 recent labs ok; 11/18 relatively stable considering IV lasix this weekend - recheck in AM  11-19: Mild AKI, relatively unchanged, BUN stable.   8: Hypertension/CHF: monitor TID and prn             -Zestril, Lopressor held due to hypotension             -Lasix 40 mg daily resumed 11/13 (on Klor-con)             -BP well-controlled, weights stable   - 11/14: Add TED hose for edema   - 11/15: BMP today with extra 20 mg PO dose for weight gain / volume increase if looks OK --> labs reviewed, 1x 20 mg PO lasix given for edema 11/17  I/O's not recorded accurately yesterday and no weight today  -will request daily weights and strict I/Os  -still on IV lasix, continue per IM--appreciate their help!    -will increase kdur to bid--check labs tomorrow  11/18: K 4.0; weights down/stable from 11/15 - ongoing edema RLE likely from vein harvesting.    - 11.19: Discussed with Dr. Tessa Lerner - Discharge regimen lasix 40 qday and 20mg  prn for weight >1# over 24hr or 5# over 7 days + TED hose/ACE compression to RLE     08/30/2023    5:02 AM 08/29/2023    7:42 PM 08/29/2023    3:26 PM  Vitals with BMI  Systolic 97   97 95 97  Diastolic 71   71 57 53  Pulse 60   62 62 65   Filed Weights   08/26/23 0500 08/28/23 0856 08/29/23 0900  Weight: 122.7 kg 118 kg 118.3 kg    9: Hyperlipidemia: continue statin, Zetia, Vascepa   10: Episode of SVT: started amiodarone 400 mg BID on 11/12 for one week, then 200 mg BID for one week then 200 mg daily             -  follow-up with Dr. Odis Hollingshead    - 11-14: Minimal bradycardia overnight, asymptomatic, monitor   - 11/17: HR stable  11: DM-2: CBGs QID; A1c = 6.5% (at home on Farxiga, [place], metformin and Mounjaro)             -continue SSI  -11-14 blood sugar slightly elevated, creatinine within  normal limits, resume home metformin 1000 mg twice daily.   - 11/17: reasonable control   - 11/18: BMP in AM; if Cr normalized will resume Farxiga 10 mg    Recent Labs    08/29/23 1702 08/29/23 2040 08/30/23 0616  GLUCAP 140* 127* 100*     12: Thrush: continue Nystatin  -Not noted on exam 11-14; Nystatin Dced 11/15   13: OSA-on CPAP requiring supplemental O2  -On room air 11-14   - ISB Q4H for pulmonary recovery   14: ABLA with chest hematoma: follow-up CBC.  Hemoglobin 9.6 on 11/11   -9.8 on admission labs, monitor q. Weekly  15: Morbid Obesity BMI 43             -Dietary counseling   16: Left shoulder numbness and weakness.  Suspect she may have had axillary nerve injury.  Continue to monitor for now   17. Constipation/ileus - LBM 11/12 with mag citrate   - DC colace, add sennakot-S 8m tab BID to miralax BID; keep docusate   - Add PRN lactulose 20 gm (wish to stay away from magnesium containing laxatives given cardiac issues)  11/17 mild ileus on xray.    -multiple BM's after SMOG enema. Feels better   -increased senna-s to 2 tabs bid   -maintain nutrition/electrolytes   -appreciate hospitalist help   -continue current diet, OOB  11/18: Much improved  18. Anemia - HgB 9.8->8.8 11/18   - CBC repeat in AM - stable 8.6 11/19  - RLE vein harvesting site hematoma most likely cause - without drainage -- tense but stable--examined by CTS APP 11/18 w/o concern  LOS: 6 days A FACE TO FACE EVALUATION WAS PERFORMED  Angelina Sheriff 08/30/2023, 9:24 AM

## 2023-08-30 NOTE — Progress Notes (Signed)
Physical Therapy Session Note  Patient Details  Name: Renee Ho MRN: [id number] Date of Birth: Jun 12, 1976  Today's Date: 08/30/2023 PT Individual Time: 1330-1400 PT Individual Time Calculation (min): 30 min   Short Term Goals: Week 1:  PT Short Term Goal 1 (Week 1): STG = LTG due to ELOS  Skilled Therapeutic Interventions/Progress Updates:    Session focused on functional strengthening and overall cardiovascular and muscular endurance training for improved overall mobility and home/community re-entry. Using 4# ankle weights performed floor ladder with focus on lateral, forward, and retro stepping x 2 reps each direction (R and L) with seated rest breaks needed between sets due to fatigue.Occasional min/CGA needed due to balance with retro stepping. After first round, pt reports RLE pain more increased with weight, so removed that and completed with weight just on LLE. Monitored O2 and HR after bouts of activity and continues to drop to upper 80's (lowest today was 85%) and recover to 90-94% within about 1 min with cues for breathing technique. Performed lateral side steps with squats with cues for technique and breathing x 30' each direction R and L with seated break. Pt performed functional transfers and gait on unit independent without AD.   Therapy Documentation Precautions:  Precautions Precautions: Sternal Restrictions Weight Bearing Restrictions: No  Other Position/Activity Restrictions: sternal precautions  Pain:  Pain in RLE - unrated. Premedicated.    Therapy/Group: Individual Therapy  Karolee Stamps Darrol Poke, PT, DPT, CBIS  08/30/2023, 2:03 PM

## 2023-08-30 NOTE — Progress Notes (Signed)
Patient ID: Renee Ho, female   DOB: 1976/04/27, 47 y.o.   MRN: [id number] SW ordered rollator and TTB with [employer] via parachute.   SW met with pt and pt husband in room to confirm d/c plan tomorrow. SW will follow-up about DME. SW will confirm when she needs to be set up for therapies.   SW spoke with nursing at Cardiology office to discuss cardiac rehab. Reports after her first follow-up appointment, it will be determine when a referral is appropriate.   SW informed pt on above about continued rehab.   Cecile Sheerer, MSW, LCSW Office: [phone] Cell: [phone] Fax: [phone]

## 2023-08-31 ENCOUNTER — Other Ambulatory Visit (HOSPITAL_COMMUNITY): Payer: Self-pay

## 2023-08-31 DIAGNOSIS — R5381 Other malaise: Secondary | ICD-10-CM | POA: Diagnosis not present

## 2023-08-31 LAB — GLUCOSE, CAPILLARY: Glucose-Capillary: 100 mg/dL — ABNORMAL HIGH (ref 70–99)

## 2023-08-31 MED ORDER — FUROSEMIDE 20 MG PO TABS
20.0000 mg | ORAL_TABLET | ORAL | 0 refills | Status: DC | PRN
  Filled 2023-08-31: qty 30, 30d supply, fill #0

## 2023-08-31 MED ORDER — CLOPIDOGREL BISULFATE 75 MG PO TABS
75.0000 mg | ORAL_TABLET | Freq: Every day | ORAL | 0 refills | Status: DC
  Filled 2023-08-31: qty 30, 30d supply, fill #0

## 2023-08-31 MED ORDER — OXYCODONE HCL 5 MG PO TABS
5.0000 mg | ORAL_TABLET | Freq: Four times a day (QID) | ORAL | 0 refills | Status: DC | PRN
  Filled 2023-08-31: qty 28, 7d supply, fill #0

## 2023-08-31 MED ORDER — ACETAMINOPHEN 325 MG PO TABS
325.0000 mg | ORAL_TABLET | ORAL | 0 refills | Status: DC | PRN
  Filled 2023-08-31: qty 100, 9d supply, fill #0

## 2023-08-31 MED ORDER — ASPIRIN 81 MG PO TBEC
81.0000 mg | DELAYED_RELEASE_TABLET | Freq: Every day | ORAL | 0 refills | Status: DC
  Filled 2023-08-31: qty 30, 30d supply, fill #0

## 2023-08-31 MED ORDER — BLOOD GLUCOSE MONITOR SYSTEM W/DEVICE KIT
PACK | 0 refills | Status: AC
  Filled 2023-08-31: qty 1, 30d supply, fill #0

## 2023-08-31 MED ORDER — EZETIMIBE 10 MG PO TABS
10.0000 mg | ORAL_TABLET | Freq: Every day | ORAL | 0 refills | Status: DC
  Filled 2023-08-31: qty 30, 30d supply, fill #0

## 2023-08-31 MED ORDER — ONETOUCH DELICA PLUS LANCET30G MISC
0 refills | Status: AC
  Filled 2023-08-31: qty 100, 30d supply, fill #0

## 2023-08-31 MED ORDER — ICOSAPENT ETHYL 1 G PO CAPS
2.0000 g | ORAL_CAPSULE | Freq: Two times a day (BID) | ORAL | 0 refills | Status: DC
  Filled 2023-08-31: qty 120, 30d supply, fill #0

## 2023-08-31 MED ORDER — POTASSIUM CHLORIDE CRYS ER 20 MEQ PO TBCR
20.0000 meq | EXTENDED_RELEASE_TABLET | Freq: Two times a day (BID) | ORAL | 0 refills | Status: DC
  Filled 2023-08-31: qty 60, 30d supply, fill #0

## 2023-08-31 MED ORDER — METHOCARBAMOL 750 MG PO TABS
750.0000 mg | ORAL_TABLET | Freq: Three times a day (TID) | ORAL | 0 refills | Status: DC
  Filled 2023-08-31: qty 90, 30d supply, fill #0

## 2023-08-31 MED ORDER — GLUCOSE BLOOD VI STRP: ORAL_STRIP | 0 refills | Status: AC

## 2023-08-31 MED ORDER — GLUCOSE BLOOD VI STRP
ORAL_STRIP | 0 refills | Status: AC
  Filled 2023-08-31: qty 100, 30d supply, fill #0

## 2023-08-31 MED ORDER — DULOXETINE HCL 30 MG PO CPEP
90.0000 mg | ORAL_CAPSULE | Freq: Every day | ORAL | 0 refills | Status: DC
  Filled 2023-08-31: qty 90, 30d supply, fill #0

## 2023-08-31 MED ORDER — LIDOCAINE 5 % EX PTCH
1.0000 | MEDICATED_PATCH | CUTANEOUS | 0 refills | Status: DC
  Filled 2023-08-31: qty 30, 30d supply, fill #0

## 2023-08-31 MED ORDER — AMIODARONE HCL 200 MG PO TABS
ORAL_TABLET | ORAL | 0 refills | Status: DC
  Filled 2023-08-31: qty 40, 34d supply, fill #0

## 2023-08-31 MED ORDER — TRAZODONE HCL 150 MG PO TABS
75.0000 mg | ORAL_TABLET | Freq: Every evening | ORAL | 0 refills | Status: DC | PRN
  Filled 2023-08-31: qty 15, 30d supply, fill #0

## 2023-08-31 MED ORDER — ONETOUCH DELICA PLUS LANCET30G MISC
0 refills | Status: DC
  Filled 2023-08-31: qty 100, 30d supply, fill #0

## 2023-08-31 MED ORDER — FUROSEMIDE 40 MG PO TABS
40.0000 mg | ORAL_TABLET | Freq: Every day | ORAL | 0 refills | Status: DC
  Filled 2023-08-31: qty 30, 30d supply, fill #0

## 2023-08-31 MED ORDER — ATORVASTATIN CALCIUM 80 MG PO TABS
80.0000 mg | ORAL_TABLET | Freq: Every day | ORAL | 0 refills | Status: DC
  Filled 2023-08-31: qty 30, 30d supply, fill #0

## 2023-08-31 MED FILL — Heparin Sodium (Porcine) Inj 1000 Unit/ML: INTRAMUSCULAR | Qty: 10 | Status: AC

## 2023-08-31 MED FILL — Mannitol IV Soln 20%: INTRAVENOUS | Qty: 500 | Status: AC

## 2023-08-31 MED FILL — Lidocaine HCl Local Soln Prefilled Syringe 100 MG/5ML (2%): INTRAMUSCULAR | Qty: 5 | Status: AC

## 2023-08-31 MED FILL — Electrolyte-R (PH 7.4) Solution: INTRAVENOUS | Qty: 4000 | Status: AC

## 2023-08-31 MED FILL — Sodium Chloride IV Soln 0.9%: INTRAVENOUS | Qty: 2000 | Status: AC

## 2023-08-31 NOTE — Progress Notes (Signed)
Inpatient Rehabilitation Discharge Medication Review by a Pharmacist  A complete drug regimen review was completed for this patient to identify any potential clinically significant medication issues.  High Risk Drug Classes Is patient taking? 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  Hypoglycemics/insulin Yes Marcelline Deist, Mounjaro, metformin: DM  Vasoactive Medication Yes Amiodarone, Lasix: SVT, HTN  Chemotherapy No   Other Yes Tylenol, Lidoderm patch: pain  Robaxin: muscle spasms Trazodone: sleep Lipitor, Zetia, Vascepa: HLD  Duloxetine: depression/anxiety KCl: supplement     Type of Medication Issue Identified Description of Issue Recommendation(s)  Drug Interaction(s) (clinically significant)     Duplicate Therapy     Allergy     No Medication Administration End Date     Incorrect Dose     Additional Drug Therapy Needed     Significant med changes from prior encounter (inform family/care partners about these prior to discharge).    Other       Clinically significant medication issues were identified that warrant physician communication and completion of prescribed/recommended actions by midnight of the next day:  No   Time spent performing this drug regimen review (minutes): 30  Loura Back, PharmD, BCPS Clinical Pharmacist Clinical phone for 08/31/2023 is [phone] 08/31/2023 10:45 AM

## 2023-08-31 NOTE — Progress Notes (Signed)
PROGRESS NOTE   Subjective/Complaints:  No acute complaints - RLE remains very sensitive and tender to palpation behind the knee, but ultimately has been stable.  No events overnight.  Vital stable.  All questions regarding discharge answered.   ROS: + R leg pain and edema -stable, +Constipation - improved. Patient denies fever, rash, sore throat, blurred vision, dizziness, nausea, vomiting, diarrhea, cough, shortness of breath or chest pain, joint or back/neck pain, headache, or mood change.     Objective:   VAS [place] LOWER EXTREMITY VENOUS (DVT)  Result Date: 08/29/2023  Lower Venous DVT Study Patient Name:  Renee Ho  Date of Exam:   08/29/2023 Medical Rec #: [id number]                   Accession #:    [id number] Date of Birth: [birth year]                    Patient Gender: F Patient Age:   47 years Exam Location:  [facility] Procedure:      VAS [place] LOWER EXTREMITY VENOUS (DVT) Referring Phys: Wendi Maya --------------------------------------------------------------------------------  Indications: Swelling, Edema, and SOB.  Comparison Study: No prior exam. Performing Technologist: Fernande Bras  Examination Guidelines: A complete evaluation includes B-mode imaging, spectral Doppler, color Doppler, and power Doppler as needed of all accessible portions of each vessel. Bilateral testing is considered an integral part of a complete examination. Limited examinations for reoccurring indications may be performed as noted. The reflux portion of the exam is performed with the patient in reverse Trendelenburg.  +---------+---------------+---------+-----------+----------+--------------+ RIGHT    CompressibilityPhasicitySpontaneityPropertiesThrombus Aging +---------+---------------+---------+-----------+----------+--------------+ CFV      Full           Yes      Yes                                  +---------+---------------+---------+-----------+----------+--------------+ SFJ      Full                                                        +---------+---------------+---------+-----------+----------+--------------+ FV Prox  Full                                                        +---------+---------------+---------+-----------+----------+--------------+ FV Mid   Full                                                        +---------+---------------+---------+-----------+----------+--------------+ FV DistalFull                                                        +---------+---------------+---------+-----------+----------+--------------+  PFV      Full                                                        +---------+---------------+---------+-----------+----------+--------------+ POP      Full           Yes      Yes                                 +---------+---------------+---------+-----------+----------+--------------+ PTV      Full                                                        +---------+---------------+---------+-----------+----------+--------------+ PERO     Full                                                        +---------+---------------+---------+-----------+----------+--------------+ Fluid collection noted at site of GSV harvest in upper thigh measuring: 7.4 x 1.1 x 3.3 cm and lower thigh measuring: 6.9 x 0.7 x 3.1 cm  +---------+---------------+---------+-----------+----------+--------------+ LEFT     CompressibilityPhasicitySpontaneityPropertiesThrombus Aging +---------+---------------+---------+-----------+----------+--------------+ CFV      Full           Yes      Yes                                 +---------+---------------+---------+-----------+----------+--------------+ SFJ      Full                                                         +---------+---------------+---------+-----------+----------+--------------+ FV Prox  Full                                                        +---------+---------------+---------+-----------+----------+--------------+ FV Mid   Full                                                        +---------+---------------+---------+-----------+----------+--------------+ FV DistalFull                                                        +---------+---------------+---------+-----------+----------+--------------+ PFV      Full                                                        +---------+---------------+---------+-----------+----------+--------------+  POP      Full           Yes      Yes                                 +---------+---------------+---------+-----------+----------+--------------+ PTV      Full                                                        +---------+---------------+---------+-----------+----------+--------------+ PERO     Full                                                        +---------+---------------+---------+-----------+----------+--------------+     Summary: BILATERAL: - No evidence of deep vein thrombosis seen in the lower extremities, bilaterally. -No evidence of popliteal cyst, bilaterally.   *See table(s) above for measurements and observations. Electronically signed by Gerarda Fraction on 08/29/2023 at 4:25:55 PM.    Final    Recent Labs    08/29/23 0624 08/30/23 0637  WBC 8.1 8.7  HGB 8.8* 8.6*  HCT 27.9* 27.4*  PLT 242 250    Recent Labs    08/29/23 0624 08/30/23 0637  NA 136 139  K 4.0 3.9  CL 97* 99  CO2 31 33*  GLUCOSE 142* 124*  BUN 13 14  CREATININE 1.12* 1.13*  CALCIUM 8.4* 8.2*    Intake/Output Summary (Last 24 hours) at 08/31/2023 0753 Last data filed at 08/30/2023 0900 Gross per 24 hour  Intake 240 ml  Output --  Net 240 ml        Physical Exam: Vital Signs Blood pressure 118/60, pulse 69,  temperature 98.2 F (36.8 C), resp. rate 17, height 5\' 5"  (1.651 m), weight 118.3 kg, SpO2 96%.  Constitutional: No distress . Vital signs reviewed.  +obese. Ambulating in room.  HEENT: NCAT, EOMI, oral membranes moist Neck: supple, no JVD.  Cardiovascular: RRR without murmur . + RLE edema 1+ - mostly isolated to dorsal foot and calf Respiratory/Chest: CTA Bilaterally without wheezes or rales. Normal effort    GI/Abdomen: BS +, non-tender, non-distended Ext: no clubbing, cyanosis, or LE tr to 1+ edema Psych: pleasant and cooperative  Skin:  Sternal incision midline CDI -healing well, well-approximated. Drain sites -both areas appear secured with Steri-Strips  Right leg donor site  - No current drainage.  Appears to be healing. Bruising on R medial thigh -less tense today, ongoing sensitivity/TTP, no warmth or change in contour -bruises migrating inferiorly somewhat to the medial thigh just behind the knee.          Neuro: Alert and oriented x 4, apparent deficits Strength tested limited by sternal precautions in upper extremities; antigravity and against resistance bilaterally Bilateral lower extremity strength 5 out of 5 Decreased sensation over lateral left shoulder -improved from admission.  Musculoskeletal: Right lower extremity at proximal thigh and chest wall tenderness present  Left upper extremity range of motion improved, can get shoulder to 90 degrees in abduction and flexion.  Assessment/Plan: 1. Functional deficits which require 3+ hours per day of interdisciplinary therapy in a comprehensive inpatient  rehab setting. Physiatrist is providing close team supervision and 24 hour management of active medical problems listed below. Physiatrist and rehab team continue to assess barriers to discharge/monitor patient progress toward functional and medical goals  Care Tool:  Bathing    Body parts bathed by patient: Abdomen, Chest, Left arm, Right arm, Buttocks, Front  perineal area, Right upper leg, Left upper leg, Left lower leg, Face, Right lower leg         Bathing assist Assist Level: Independent     Upper Body Dressing/Undressing Upper body dressing   What is the patient wearing?: Pull over shirt    Upper body assist Assist Level: Independent    Lower Body Dressing/Undressing Lower body dressing      What is the patient wearing?: Pants, Underwear/pull up     Lower body assist Assist for lower body dressing: Independent     Toileting Toileting    Toileting assist Assist for toileting: Independent     Transfers Chair/bed transfer  Transfers assist     Chair/bed transfer assist level: Independent     Locomotion Ambulation   Ambulation assist      Assist level: Independent Assistive device: No Device Max distance: 250'   Walk 10 feet activity   Assist     Assist level: Independent Assistive device: No Device   Walk 50 feet activity   Assist    Assist level: Independent Assistive device: No Device    Walk 150 feet activity   Assist    Assist level: Independent Assistive device: No Device    Walk 10 feet on uneven surface  activity   Assist Walk 10 feet on uneven surfaces activity did not occur: Safety/medical concerns   Assist level: Independent     Wheelchair     Assist Is the patient using a wheelchair?: No Type of Wheelchair: Manual    Wheelchair assist level: Dependent - Patient 0%      Wheelchair 50 feet with 2 turns activity    Assist        Assist Level: Dependent - Patient 0%   Wheelchair 150 feet activity     Assist      Assist Level: Dependent - Patient 0%   Blood pressure 118/60, pulse 69, temperature 98.2 F (36.8 C), resp. rate 17, height 5\' 5"  (1.651 m), weight 118.3 kg, SpO2 96%.  Medical Problem List and Plan: 1. 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 sternal precautions             -patient may  shower, cover incisions please             -ELOS/Goals: 7 to 10 days, mod I to supervision with PT and OT - DC goal 11/20   - 11/19: Mod I for all cares, oxygen staying stable, working on pain management and endurance mostly.  The patient is medically ready for discharge to home and will not need follow-up with [facility] PM&R. In addition, they will need to follow up with their PCP, CT surgery, and Cardiology.    2.  Antithrombotics: -DVT/anticoagulation:  Pharmaceutical: Lovenox 60 mg q HS  BL LE Duplex 11/18 negative             -antiplatelet therapy: Aspirin and Plavix    3. Pain Management: Tylenol, oxycodone, tramadol as needed   - Add Tylenol 1000 mg scheduled, robaxin 750 mg TID   - Add lido patch over upper sternum for localized pain, as incision currently  well approximated without drainage  11/15: Dc oxycodone, DC robaxin, increase tramadol to 50-100 mg Q6H PRN, add dilaudid 0.5 mg once daily PRN IV for breakthrough to limit pill and opiate burden --> resume prior regimen    11/17 pt satisfied with current regimen  11/18: Using 10 mg consistently; can DC tramadol at discharge and wean to 5 mg QID to off over 2 weeks on discharge -discussed this with patient at discharge 11-20   4. Mood/Behavior/Sleep: LCSW to evaluate and provide emotional support             -continue Cymbalta 90 mg daily             -continue melatonin 3 mg q HS --> increase to 5 mg scheduled 2000, and add trazodone 50 mg PRN             -antipsychotic agents: n/a  11/15: DC melatonin and trazodone to reduce pill burden --> resumed with melatonin 5 mg scheduled; trazodone 75 mg PRN   - Sleeping well   5. Neuropsych/cognition: This patient is capable of making decisions on her own behalf.   6. Skin/Wound Care: Routine skin care checks   - Sternal incision healing well   - R groin vein harvesting site closed   - Bilateral drain sutures remain intact - CTS APP to removed on 11/18   - 11/18: R drain site open, bleeding  after suture removal; cover  11-19: Both sides to his, secured with Steri-Strips.  No further bleeding once placed.  Monitor and follow-up with CT surgery.   7. Fluids/Electrolytes/Nutrition: Routine Is and Os and follow-up chemistries             -continue Klor-con 20 mEq daily and follow-up BMP   - 11/17 recent labs ok; 11/18 relatively stable considering IV lasix this weekend - recheck in AM  11-19: Mild AKI, relatively unchanged, BUN stable.   8: Hypertension/CHF: monitor TID and prn             -Zestril, Lopressor held due to hypotension             -Lasix 40 mg daily resumed 11/13 (on Klor-con)             -BP well-controlled, weights stable   - 11/14: Add TED hose for edema   - 11/15: BMP today with extra 20 mg PO dose for weight gain / volume increase if looks OK --> labs reviewed, 1x 20 mg PO lasix given for edema 11/17  I/O's not recorded accurately yesterday and no weight today  -will request daily weights and strict I/Os  -still on IV lasix, continue per IM--appreciate their help!    -will increase kdur to bid--check labs tomorrow  11/18: K 4.0; weights down/stable from 11/15 - ongoing edema RLE likely from vein harvesting.    - 11/19: Discussed with Dr. Tessa Lerner - Discharge regimen lasix 40 qday and 20mg  prn for weight >1# over 24hr or 5# over 7 days + TED hose/ACE compression to RLE  11-20: Edema does improve your improved somewhat.  Continue to encourage Ace wrap/compression stockings and follow-up with cardiology as above.     08/31/2023    4:20 AM 08/30/2023    8:10 PM 08/30/2023    2:10 PM  Vitals with BMI  Systolic 118 124 235  Diastolic 60 57 59  Pulse 69 71 65   Filed Weights   08/26/23 0500 08/28/23 0856 08/29/23 0900  Weight: 122.7 kg 118 kg  118.3 kg    9: Hyperlipidemia: continue statin, Zetia, Vascepa   10: Episode of SVT: started amiodarone 400 mg BID on 11/12 for one week, then 200 mg BID for one week then 200 mg daily             -follow-up  with Dr. Odis Hollingshead    - 11-14: Minimal bradycardia overnight, asymptomatic, monitor   - 11/17: HR stable -no further recurrence throughout inpatient rehab.  11: DM-2: CBGs QID; A1c = 6.5% (at home on Farxiga, [place], metformin and Mounjaro)             -continue SSI  -11-14 blood sugar slightly elevated, creatinine within normal limits, resume home metformin 1000 mg twice daily.   - 11/17: reasonable control   - 11/18: BMP in AM; if Cr normalized will resume Farxiga 10 mg    - Resume home regimen at discharge   Recent Labs    08/30/23 1914 08/30/23 2050 08/31/23 0620  GLUCAP 138* 166* 100*     12: Thrush: continue Nystatin  -Not noted on exam 11-14; Nystatin Dced 11/15   13: OSA-on CPAP requiring supplemental O2  -On room air 11-14   - ISB Q4H for pulmonary recovery   14: ABLA with chest hematoma: follow-up CBC.  Hemoglobin 9.6 on 11/11   -9.8 on admission labs, monitor q. Weekly  15: Morbid Obesity BMI 43             -Dietary counseling   16: Left shoulder numbness and weakness.  Suspect she may have had axillary nerve injury.  Continue to monitor for now   17. Constipation/ileus - LBM 11/12 with mag citrate   - DC colace, add sennakot-S 38m tab BID to miralax BID; keep docusate   - Add PRN lactulose 20 gm (wish to stay away from magnesium containing laxatives given cardiac issues)  11/17 mild ileus on xray.    -multiple BM's after SMOG enema. Feels better   -increased senna-s to 2 tabs bid   -maintain nutrition/electrolytes   -appreciate hospitalist help   -continue current diet, OOB  11/18: Much improved/resolved  18. Anemia - HgB 9.8->8.8 11/18   - CBC repeat in AM - stable 8.6 11/19  - RLE vein harvesting site hematoma most likely cause - without drainage -- tense but stable--examined by CTS APP 11/18 w/o concern  LOS: 7 days A FACE TO FACE EVALUATION WAS PERFORMED  Angelina Sheriff 08/31/2023, 7:53 AM

## 2023-08-31 NOTE — Progress Notes (Signed)
Inpatient Rehabilitation Care Coordinator Discharge Note   Patient Details  Name: Renee Ho MRN: [id number] Date of Birth: 1975-11-15   Discharge location: D/c to home with support from family  Length of Stay: 6 days  Discharge activity level: Mod I  Home/community participation: Limited  Patient response AY:TKZSWF Literacy - How often do you need to have someone help you when you read instructions, pamphlets, or other written material from your doctor or pharmacy?: Never  Patient response UX:NATFTD Isolation - How often do you feel lonely or isolated from those around you?: Never  Services provided included: MD, RD, PT, OT, RN, CM, TR, Pharmacy, Neuropsych, SW  Financial Services:  Field seismologist Utilized: [employer]  Choices offered to/list presented to: patient  Follow-up services arranged:  Outpatient, DME    Outpatient Servicies: Services deferred to cardiology as they will make cardiac rehab referral when appropriate DME : [employer] for rollator and TTB    Patient response to transportation need: Is the patient able to respond to transportation needs?: Yes In the past 12 months, has lack of transportation kept you from medical appointments or from getting medications?: No In the past 12 months, has lack of transportation kept you from meetings, work, or from getting things needed for daily living?: No   Patient/Family verbalized understanding of follow-up arrangements:  Yes  Individual responsible for coordination of the follow-up plan: contact pt [phone]  Confirmed correct DME delivered: Gretchen Short 08/31/2023    Comments (or additional information):fam edu completed  Summary of Stay    Date/Time Discharge Planning CSW  08/30/23 1038 D/c to home with husband, and support from various family members to assist with care needs. SW will confirm there are no barriers to discharge. AAC        Darean Rote A Lula Olszewski

## 2023-09-06 ENCOUNTER — Other Ambulatory Visit (HOSPITAL_COMMUNITY): Payer: Self-pay

## 2023-09-06 ENCOUNTER — Telehealth: Payer: Self-pay | Admitting: Cardiology

## 2023-09-06 DIAGNOSIS — G4733 Obstructive sleep apnea (adult) (pediatric): Secondary | ICD-10-CM | POA: Diagnosis not present

## 2023-09-06 DIAGNOSIS — G4730 Sleep apnea, unspecified: Secondary | ICD-10-CM | POA: Diagnosis not present

## 2023-09-06 NOTE — Telephone Encounter (Signed)
Patient called and said that her FMLA form needs to be updated. Husband coming to office to get updated form with payment

## 2023-09-07 ENCOUNTER — Other Ambulatory Visit (HOSPITAL_COMMUNITY): Payer: Self-pay

## 2023-09-07 NOTE — Telephone Encounter (Signed)
Received [employer]ML[employer]  form and $29 payment. Form in Con[employer]'s box.

## 2023-09-12 ENCOUNTER — Telehealth: Payer: Self-pay | Admitting: *Deleted

## 2023-09-12 NOTE — Telephone Encounter (Signed)
Copied from CRM [id number]. Topic: Appointments - Appointment Scheduling >> Sep 12, 2023  7:34 AM Theodis Sato wrote: PT is requesting an appointment for a hospital follow up- Pt was in the hospital for heat attack and triple by pass surgery.      LVM for pt to call office to see about scheduling a hospital f/u. I did not see any availability for a hospital f/u.  I was going to schedule her in the 1:10 or 1:30 slot and be sure it was a 40 minute slot.  It would be with Dr. Constance Goltz due to PCP not having any availability.

## 2023-09-13 NOTE — Telephone Encounter (Signed)
Patient called to follow-up on status of her FMLA form.

## 2023-09-19 ENCOUNTER — Other Ambulatory Visit: Payer: Self-pay | Admitting: Thoracic Surgery (Cardiothoracic Vascular Surgery)

## 2023-09-19 DIAGNOSIS — Z951 Presence of aortocoronary bypass graft: Secondary | ICD-10-CM

## 2023-09-20 ENCOUNTER — Ambulatory Visit (INDEPENDENT_AMBULATORY_CARE_PROVIDER_SITE_OTHER): Payer: Self-pay | Admitting: Thoracic Surgery (Cardiothoracic Vascular Surgery)

## 2023-09-20 ENCOUNTER — Ambulatory Visit
Admission: RE | Admit: 2023-09-20 | Discharge: 2023-09-20 | Disposition: A | Discharge status: Home / Self Care | Payer: 59 | Source: Ambulatory Visit | Attending: Thoracic Surgery (Cardiothoracic Vascular Surgery) | Admitting: Thoracic Surgery (Cardiothoracic Vascular Surgery)

## 2023-09-20 ENCOUNTER — Other Ambulatory Visit (HOSPITAL_COMMUNITY): Payer: Self-pay

## 2023-09-20 ENCOUNTER — Encounter: Payer: Self-pay | Admitting: Cardiology

## 2023-09-20 ENCOUNTER — Telehealth: Payer: Self-pay

## 2023-09-20 ENCOUNTER — Other Ambulatory Visit: Payer: Self-pay

## 2023-09-20 ENCOUNTER — Encounter: Payer: Self-pay | Admitting: Thoracic Surgery (Cardiothoracic Vascular Surgery)

## 2023-09-20 ENCOUNTER — Ambulatory Visit: Payer: 59 | Attending: Cardiology | Admitting: Cardiology

## 2023-09-20 VITALS — BP 148/85 | HR 81 | Resp 20 | Ht 65.0 in | Wt 240.0 lb

## 2023-09-20 VITALS — BP 146/78 | HR 80 | Ht 65.0 in | Wt 241.6 lb

## 2023-09-20 DIAGNOSIS — E1165 Type 2 diabetes mellitus with hyperglycemia: Secondary | ICD-10-CM

## 2023-09-20 DIAGNOSIS — I2510 Atherosclerotic heart disease of native coronary artery without angina pectoris: Secondary | ICD-10-CM | POA: Diagnosis not present

## 2023-09-20 DIAGNOSIS — E785 Hyperlipidemia, unspecified: Secondary | ICD-10-CM | POA: Diagnosis not present

## 2023-09-20 DIAGNOSIS — I10 Essential (primary) hypertension: Secondary | ICD-10-CM | POA: Diagnosis not present

## 2023-09-20 DIAGNOSIS — Z720 Tobacco use: Secondary | ICD-10-CM

## 2023-09-20 DIAGNOSIS — Z951 Presence of aortocoronary bypass graft: Secondary | ICD-10-CM | POA: Diagnosis not present

## 2023-09-20 DIAGNOSIS — Z794 Long term (current) use of insulin: Secondary | ICD-10-CM

## 2023-09-20 DIAGNOSIS — Z9889 Other specified postprocedural states: Secondary | ICD-10-CM | POA: Diagnosis not present

## 2023-09-20 DIAGNOSIS — E1169 Type 2 diabetes mellitus with other specified complication: Secondary | ICD-10-CM | POA: Diagnosis not present

## 2023-09-20 MED ORDER — METOPROLOL SUCCINATE ER 25 MG PO TB24
25.0000 mg | ORAL_TABLET | Freq: Every day | ORAL | 0 refills | Status: DC
  Filled 2023-09-20: qty 30, 30d supply, fill #0

## 2023-09-20 MED ORDER — ONDANSETRON HCL 4 MG PO TABS
4.0000 mg | ORAL_TABLET | Freq: Three times a day (TID) | ORAL | 0 refills | Status: DC | PRN
  Filled 2023-09-20: qty 20, 7d supply, fill #0

## 2023-09-20 MED ORDER — TRAMADOL HCL 50 MG PO TABS
50.0000 mg | ORAL_TABLET | Freq: Four times a day (QID) | ORAL | 0 refills | Status: DC | PRN
  Filled 2023-09-20: qty 28, 7d supply, fill #0

## 2023-09-20 NOTE — Telephone Encounter (Signed)
FMLA form completed and faxed to [employer] @ [phone]. Beginning LOA 08/14/23 through 11/14/23./ DOS 08/19/23.

## 2023-09-20 NOTE — Patient Instructions (Signed)
Medication Instructions:  Your physician has recommended you make the following change in your medication:   STOP Amiodarone  START Metoprolol Succinate (Toprol-XL) 25 mg once daily     *If you need a refill on your cardiac medications before your next appointment, please call your pharmacy*  Lab Work: None ordered today. If you have labs (blood work) drawn today and your tests are completely normal, you will receive your results only by: MyChart Message (if you have MyChart) OR A paper copy in the mail If you have any lab test that is abnormal or we need to change your treatment, we will call you to review the results.  Testing/Procedures: None ordered today.  Follow-Up: At [facility], you and your health needs are our priority.  As part of our continuing mission to provide you with exceptional heart care, we have created designated Provider Care Teams.  These Care Teams include your primary Cardiologist (physician) and Advanced Practice Providers (APPs -  Physician Assistants and Nurse Practitioners) who all work together to provide you with the care you need, when you need it.  Your next appointment:   3 month(s)  The format for your next appointment:   In Person  Provider:   Tessa Lerner, DO {  Other Instructions Please have your primary care provider send [place] a copy of your lab work when completed.

## 2023-09-20 NOTE — Progress Notes (Signed)
[address]       [place] [address]             [phone]     HPI: [name] returns for a scheduled follow-up visit after recent coronary bypass grafting  Renee Ho is a 47 year old woman with a history of CAD, ST elevation MI, hypertension, diabetes, obstructive sleep apnea, obesity, hyperlipidemia, tobacco use, anxiety, and depression.  She had an ST elevation MI in October treated with a DES to the circumflex.  Initial plan was to wait a interval and then have bypass surgery to address significant residual disease in the LAD and right coronary.  She presented back with unstable chest pain and ruled in for non-ST elevation MI in November.    She underwent coronary bypass grafting x 4 on 08/19/2023.  Postoperative course was complicated by atrial fibrillation.  She converted to sinus rhythm with amiodarone.  She is on Plavix for her stent.  Postdischarge she went inpatient rehab.  She was discharged from there on 08/31/2023.  In the past week she and her husband have separated.  She feels well.  Does still have some pain in the upper sternum.  She is using Tylenol for that.  Would like to have something a little stronger from time to time.  Primary complaint is nausea.  She saw Dr. Odis Hollingshead earlier today and her amiodarone was discontinued.  Past Medical History:  Diagnosis Date   Depression    Diabetes mellitus    Diabetes mellitus without complication (HCC)    Diverticula of intestine    High cholesterol    Hypertension    OSA on CPAP    CPAP pressure= 15   Polycystic ovarian syndrome    Sleep apnea    on CPAP with pressure setting= 15     Current Outpatient Medications  Medication Sig Dispense Refill   acetaminophen (TYLENOL) 325 MG tablet Take 1-2 tablets (325-650 mg total) by mouth every 4 (four) hours as needed. 100 tablet 0   aspirin EC 81 MG tablet Take 1 tablet (81 mg total) by mouth daily. Swallow whole. 30 tablet 0    atorvastatin (LIPITOR) 80 MG tablet Take 1 tablet (80 mg total) by mouth daily. 30 tablet 0   Blood Glucose Monitoring Suppl (BLOOD GLUCOSE MONITOR SYSTEM) w/Device KIT Use up to four times daily as directed. 1 kit 0   clopidogrel (PLAVIX) 75 MG tablet Take 1 tablet (75 mg total) by mouth daily. 30 tablet 0   dapagliflozin propanediol (FARXIGA) 10 MG TABS tablet Take 1 tablet (10 mg total) by mouth daily. 90 tablet 3   DULoxetine (CYMBALTA) 30 MG capsule Take 3 capsules (90 mg total) by mouth daily. 90 capsule 0   ezetimibe (ZETIA) 10 MG tablet Take 1 tablet (10 mg total) by mouth daily. 30 tablet 0   furosemide (LASIX) 40 MG tablet Take 1 tablet (40 mg total) by mouth daily. 30 tablet 0   glucose blood test strip Use as directed. 100 each 0   glucose blood test strip Use as directed. 100 each 0   icosapent Ethyl (VASCEPA) 1 g capsule Take 2 capsules (2 g total) by mouth 2 (two) times daily. 120 capsule 0   Lancets (ONETOUCH DELICA PLUS LANCET30G) MISC Use as directed. 100 each 0   metFORMIN (GLUCOPHAGE) 1000 MG tablet Take 1 tablet (1,000 mg total) by mouth 2 (two) times daily with a meal. 180 tablet 1   metoprolol succinate (  TOPROL XL) 25 MG 24 hr tablet Take 1 tablet (25 mg total) by mouth daily. 30 tablet 0   ondansetron (ZOFRAN) 4 MG tablet Take 1 tablet (4 mg total) by mouth every 8 (eight) hours as needed for nausea or vomiting. 20 tablet 0   oxyCODONE (OXY IR/ROXICODONE) 5 MG immediate release tablet Take 1 tablet (5 mg total) by mouth every 6 (six) hours as needed for severe pain (pain score 7-10). 28 tablet 0   potassium chloride SA (KLOR-CON M) 20 MEQ tablet Take 1 tablet (20 mEq total) by mouth 2 (two) times daily. 60 tablet 0   tirzepatide (MOUNJARO) 7.5 MG/0.5ML Pen Inject 7.5 mg into the skin once a week. 6 mL 1   traMADol (ULTRAM) 50 MG tablet Take 1 tablet (50 mg total) by mouth every 6 (six) hours as needed for severe pain (pain score 7-10) or moderate pain (pain score 4-6). 28  tablet 0   No current facility-administered medications for this visit.    Physical Exam BP (!) 148/85 (BP Location: Left Arm, Patient Position: Sitting, Cuff Size: Normal)   Pulse 81   Resp 20   Ht 5\' 5"  (1.651 m)   Wt 240 lb (108.9 kg)   SpO2 94% Comment: RA  BMI 39.94 kg/m  Obese 47 year old woman in no acute distress Alert and oriented x 3 with no focal deficits Lungs clear with equal breath sounds bilaterally Cardiac regular rate and rhythm with normal S1 and S2 Sternal incision clean dry and intact, sternum stable, some tenderness to palpation in the manubrial area. No peripheral edema, leg incision healing well  Diagnostic Tests: I personally reviewed her chest x-ray images.  Postoperative changes.  No effusions or infiltrates.  Impression: Renee Ho is a 47 year old woman with a history of CAD, ST elevation MI, hypertension, diabetes, obstructive sleep apnea, obesity, hyperlipidemia, tobacco use, anxiety, and depression.  Presented with a STEMI and had a DES to her circumflex in October 2024.  Presented back with unstable chest pain and underwent coronary bypass grafting x 4 for disease in her LAD and RCA in November 2024.  CAD-status post DES to the circumflex and coronary bypass grafting x 4.  No recurrent angina.  Aspirin, beta-blocker, statin.  Status post coronary bypass grafting x 4-doing well.  She may drive on a limited basis.  Appropriate precautions were discussed.  Should not lift anything over 10 pounds for another 2 weeks and nothing over 20 pounds for another 4 weeks.  She works as a Lawyer at [employer].  I advised her to wait till 3 months postop to go back to work.  I will see her back in about a month and we can finalize that plan.  Postoperative atrial fibrillation-was on amiodarone.  That was discontinued due to nausea.  I agree with that plan.  She was started on Toprol-XL 25 mg daily.   Plan: Follow-up with Dr. Odis Hollingshead as scheduled Return in 1 month  to check on progress  [name], MD [facility] [phone]

## 2023-09-20 NOTE — Progress Notes (Signed)
Cardiology Office Note:  .   Date:  09/20/2023  ID:  Renee Ho, DOB Oct 29, 1975, MRN [id number] PCP: Melida Quitter, PA  [facility] Providers Cardiologist:  Tessa Lerner, DO , [facility]  History of Present Illness: .   Renee Ho is a 47 y.o. female with a past medical history of IDDM, HLD, chronic pancreatitis, CAD s/p PCI and CABG (Nov 2024 LIMA to LAD, SVG SEQUENTIALLY to DIAGONAL 2 and 1, SVG to PDA), tobacco abuse, OSA on CPAP, and morbid obesity here for hospital follow-up.  Patient presented to the ED in October 2024 with chest pain and ruled in for NSTEMI.  Due to ongoing discomfort she underwent left heart catheterization and was noted to have obstructive disease and underwent coronary intervention.  Remaining her disease was recommended to be treated medically.  She was followed up by Tessa early November 2024 with goals of follow-up as outpatient and focus on titration of antianginal therapy.  However she presented to the ED again with anginal chest pain and eventually underwent four-vessel bypass surgery.  And eventually was discharged to inpatient rehab and recently discharged home.  Since being home patient is doing well and denies any anginal chest pain or heart failure symptoms.  She is recovering as anticipated.  She does endorse being nauseated likely secondary to her Mounjaro.  Blood sugars are well-controlled at home and most recent hemoglobin A1c is acceptable as well.  She has stopped smoking as of July 23, 2023 after her PCI.  She is congratulated on her efforts.  She is compliant with her sleep apnea regimen on a daily basis.  ROS: Review of Systems  Cardiovascular:  Negative for chest pain, claudication, irregular heartbeat, leg swelling, near-syncope, orthopnea, palpitations, paroxysmal nocturnal dyspnea and syncope.  Respiratory:  Negative for shortness of breath.   Hematologic/Lymphatic: Negative for bleeding problem.   Gastrointestinal:  Positive for nausea.   Studies Reviewed: .   08/19/2023 CORONARY ARTERY BYPASS GRAFTING (CABG) x 4 (LIMA to LAD, SVG SEQUENTIALLY to DIAGONAL 2 and 1, SVG to PDA )  EKG: 09/20/2023 sinus rhythm, 76 bpm, nonspecific T wave abnormality, without underlying injury pattern (physical copy)  Echo 07/2023  1. Left ventricular ejection fraction, by estimation, is 60 to 65%. The  left ventricle has normal function. The left ventricle demonstrates  regional wall motion abnormalities (see scoring diagram/findings for  description). There is moderate asymmetric  left ventricular hypertrophy of the septal segment. Left ventricular  diastolic parameters are consistent with Grade I diastolic dysfunction  (impaired relaxation).   2. Right ventricular systolic function is normal. The right ventricular  size is normal. Tricuspid regurgitation signal is inadequate for assessing  PA pressure.   3. The mitral valve is grossly normal. Trivial mitral valve  regurgitation. No evidence of mitral stenosis.   4. The aortic valve is tricuspid. Aortic valve regurgitation is not  visualized. No aortic stenosis is present.   5. The inferior vena cava is normal in size with greater than 50%  respiratory variability, suggesting right atrial pressure of 3 mmHg.   Cardiac cath 07/23/2023   Prox Cx to Mid Cx lesion is 100% stenosed.   Mid LAD lesion is 50% stenosed.   Prox LAD to Mid LAD lesion is 70% stenosed.   1st Diag lesion is 50% stenosed.   2nd Diag lesion is 90% stenosed.   Mid LAD to Dist LAD lesion is 50% stenosed.   Mid RCA lesion is 60% stenosed.   Dist  RCA lesion is 40% stenosed.   A drug-eluting stent was successfully placed using a SYNERGY XD 3.0X12.   Post intervention, there is a 0% residual stenosis.   NSTEMI secondary to thrombotic occlusion of the proximal Circumflex artery. Collateral filling of the distal Circumflex and OM branches from right to left  collaterals. Successful PTCA/DES x 1 proximal Circumflex Severe stenosis in the heavily calcified mid LAD involving two diagonal branches Moderate non-obstructive disease in the large dominant RCA LVEDP 11 mmHg   Recommendations: Admit to the ICU. Aggrastat drip for 2 hours. DAPT with ASA and Brilinta for one year. High intensity statin. Echo tomorrow. Will review the lesions in the LAD with the IC team. I would favor medical management of her LAD disease for now. PCI of the proximal to mid LAD would potentially jeopardize flow into the Diagonal branches which are moderate to large in caliber. Could consider bypass after 3 months of DAPT.   Physical Exam:   VS:  BP (!) 146/78   Pulse 80   Ht 5\' 5"  (1.651 m)   Wt 241 lb 9.6 oz (109.6 kg)   SpO2 96%   BMI 40.20 kg/m    Wt Readings from Last 3 Encounters:  09/20/23 241 lb 9.6 oz (109.6 kg)  08/29/23 260 lb 11.2 oz (118.3 kg)  08/24/23 260 lb 11.2 oz (118.3 kg)    GEN: Well nourished, well developed in no acute distress NECK: No JVD; No carotid bruits CARDIAC: RRR, no murmurs, rubs, gallops.  Sternotomy site is clean dry and intact, granulation tissue present. RESPIRATORY:  Clear to auscultation without rales, wheezing or rhonchi  ABDOMEN: Soft, non-tender, non-distended.  Surgical scars are healing well with granulation tissue present and no signs of infection EXTREMITIES:  No edema; No deformity, right lower extremity endovascular site is healing well granulation tissue present, no infection  ASSESSMENT AND PLAN: .      ICD-10-CM   1. Atherosclerosis of native coronary artery of native heart without angina pectoris  I25.10 metoprolol succinate (TOPROL XL) 25 MG 24 hr tablet    2. Hx of CABG  Z95.1     3. Essential hypertension  I10 CANCELED: EKG 12-Lead    4. Hyperlipidemia LDL goal <55  E78.5     5. Type 2 diabetes mellitus with hyperglycemia, with long-term current use of insulin (HCC)  E11.65    Z79.4     6. Hyperlipidemia  associated with type 2 diabetes mellitus (HCC)  E11.69    E78.5     7. Tobacco abuse  Z72.0       Atherosclerosis of native coronary artery of native heart without angina pectoris Hx of CABG Denies anginal chest pain. EKG is nonischemic. Continue dual antiplatelet therapy for now. Continue atorvastatin 80 mg p.o. daily. Continue Farxiga 10 mg p.o. daily. Continue Zetia 10 mg p.o. daily. Continue Lasix 40 mg p.o. daily. Continue Vascepa 2 g twice daily Patient was given and is taking amiodarone 200 mg 1 tablet twice daily for SVT postsurgery.  Recommended discontinuation of amiodarone for now and will transition her to Toprol-XL 25 mg p.o. daily. She has an appointment with cardiothoracic surgery later today as well.  Essential hypertension Office blood pressures are acceptable but not at goal. Home blood pressures are better controlled. Medications as discussed above. Will uptitrate GDMT based on her home blood pressure readings. She will either call us or PCP for further medication titration Reemphasized importance of low-salt diet.  Hyperlipidemia LDL goal <55 Currently on  high intensity statin therapy as well as Zetia and Vascepa.  She will have labs with PCP likely tomorrow and will send [place] a copy.  Type 2 diabetes mellitus with hyperglycemia, with long-term current use of insulin (HCC) Currently on metformin, Mounjaro, Farxiga, statin therapy Most recent hemoglobin A1c is acceptable. We emphasize importance of glycemic control  Tobacco abuse Quit October 2024 after her NSTEMI. Congratulated on her efforts. Reemphasized the importance of complete cessation of smoking going forward

## 2023-09-21 ENCOUNTER — Ambulatory Visit (INDEPENDENT_AMBULATORY_CARE_PROVIDER_SITE_OTHER): Payer: 59 | Admitting: Family Medicine

## 2023-09-21 ENCOUNTER — Other Ambulatory Visit (HOSPITAL_COMMUNITY): Payer: Self-pay

## 2023-09-21 ENCOUNTER — Encounter: Payer: Self-pay | Admitting: Family Medicine

## 2023-09-21 VITALS — BP 123/78 | HR 79 | Ht 65.0 in | Wt 238.0 lb

## 2023-09-21 DIAGNOSIS — E1169 Type 2 diabetes mellitus with other specified complication: Secondary | ICD-10-CM

## 2023-09-21 DIAGNOSIS — E1165 Type 2 diabetes mellitus with hyperglycemia: Secondary | ICD-10-CM | POA: Diagnosis not present

## 2023-09-21 DIAGNOSIS — D649 Anemia, unspecified: Secondary | ICD-10-CM | POA: Diagnosis not present

## 2023-09-21 DIAGNOSIS — E785 Hyperlipidemia, unspecified: Secondary | ICD-10-CM

## 2023-09-21 DIAGNOSIS — F32A Depression, unspecified: Secondary | ICD-10-CM

## 2023-09-21 DIAGNOSIS — Z794 Long term (current) use of insulin: Secondary | ICD-10-CM | POA: Diagnosis not present

## 2023-09-21 DIAGNOSIS — I2510 Atherosclerotic heart disease of native coronary artery without angina pectoris: Secondary | ICD-10-CM | POA: Diagnosis not present

## 2023-09-21 MED ORDER — ICOSAPENT ETHYL 1 G PO CAPS
2.0000 g | ORAL_CAPSULE | Freq: Two times a day (BID) | ORAL | 0 refills | Status: DC
  Filled 2023-09-21 – 2023-10-06 (×3): qty 360, 90d supply, fill #0

## 2023-09-21 MED ORDER — CLOPIDOGREL BISULFATE 75 MG PO TABS
75.0000 mg | ORAL_TABLET | Freq: Every day | ORAL | 0 refills | Status: DC
  Filled 2023-09-21 – 2023-09-26 (×2): qty 90, 90d supply, fill #0

## 2023-09-21 MED ORDER — ATORVASTATIN CALCIUM 80 MG PO TABS
80.0000 mg | ORAL_TABLET | Freq: Every day | ORAL | 0 refills | Status: DC
  Filled 2023-09-21 – 2023-09-26 (×2): qty 90, 90d supply, fill #0

## 2023-09-21 MED ORDER — DULOXETINE HCL 30 MG PO CPEP
90.0000 mg | ORAL_CAPSULE | Freq: Every day | ORAL | 0 refills | Status: DC
  Filled 2023-09-21: qty 90, 30d supply, fill #0

## 2023-09-21 MED ORDER — ASPIRIN 81 MG PO TBEC
81.0000 mg | DELAYED_RELEASE_TABLET | Freq: Every day | ORAL | 0 refills | Status: DC
  Filled 2023-09-21: qty 90, 90d supply, fill #0

## 2023-09-21 MED ORDER — METOPROLOL SUCCINATE ER 25 MG PO TB24
25.0000 mg | ORAL_TABLET | Freq: Every day | ORAL | 0 refills | Status: DC
  Filled 2023-09-21 – 2023-11-02 (×4): qty 90, 90d supply, fill #0

## 2023-09-21 MED ORDER — EZETIMIBE 10 MG PO TABS
10.0000 mg | ORAL_TABLET | Freq: Every day | ORAL | 0 refills | Status: DC
  Filled 2023-09-21 – 2023-10-06 (×3): qty 90, 90d supply, fill #0

## 2023-09-21 MED ORDER — POTASSIUM CHLORIDE CRYS ER 20 MEQ PO TBCR
20.0000 meq | EXTENDED_RELEASE_TABLET | Freq: Two times a day (BID) | ORAL | 0 refills | Status: DC
  Filled 2023-09-21 – 2023-09-26 (×2): qty 180, 90d supply, fill #0

## 2023-09-21 MED ORDER — DAPAGLIFLOZIN PROPANEDIOL 10 MG PO TABS
10.0000 mg | ORAL_TABLET | Freq: Every day | ORAL | 0 refills | Status: DC
  Filled 2023-09-21: qty 90, 90d supply, fill #0

## 2023-09-21 MED ORDER — ONDANSETRON HCL 4 MG PO TABS
4.0000 mg | ORAL_TABLET | Freq: Three times a day (TID) | ORAL | 0 refills | Status: DC | PRN
  Filled 2023-09-21 – 2023-09-26 (×2): qty 30, 10d supply, fill #0

## 2023-09-21 MED ORDER — FUROSEMIDE 40 MG PO TABS
40.0000 mg | ORAL_TABLET | Freq: Every day | ORAL | 0 refills | Status: DC
  Filled 2023-09-21 – 2023-09-26 (×2): qty 90, 90d supply, fill #0

## 2023-09-21 NOTE — Progress Notes (Signed)
Established Patient Office Visit  Subjective   Patient ID: Renee Ho, female    DOB: 25-Aug-1976  Age: 47 y.o. MRN: [id number]  Chief Complaint  Patient presents with   Hospitalization Follow-up    HPI Patient had an NSTEMI in October.  Had a stent placed.  After that was still having cardiac issues and had a CABG x 4 in November.  After that went to inpatient rehab.  During inpatient rehab had intestinal ileus.  Patient says her gastrointestinal symptoms have resolved.  She stopped taking amiodarone due to severe nausea.  Still having some nausea but stopped taking the medicine and replaced it with metoprolol.  Takes Zofran to help with nausea.  She saw her cardiologist recently.  She needs refills of all her medications today.  We went over the medications individually and refilled the ones she needs.  Patient still has restrictions with lifting and exercise.  Is on sternal precautions.      The ASCVD Risk score (Arnett DK, et al., 2019) failed to calculate for the following reasons:   The patient has a prior MI or stroke diagnosis  Health Maintenance Due  Topic Date Due   Colonoscopy  Never done   OPHTHALMOLOGY EXAM  08/31/2019   INFLUENZA VACCINE  05/12/2023   COVID-19 Vaccine (3 - 2023-24 season) 06/12/2023   DTaP/Tdap/Td (2 - Td or Tdap) 07/19/2023      Objective:     BP 123/78   Pulse 79   Ht 5\' 5"  (1.651 m)   Wt 238 lb (108 kg)   SpO2 96%   BMI 39.61 kg/m    Physical Exam General: Alert, oriented CV: Regular rate and rhythm no murmurs Pulmonary: Lungs clear bilaterally Skin: Well healing surgical incision scars.   No results found for any visits on 09/21/23.      Assessment & Plan:   Anemia, unspecified type -     Iron, TIBC and Ferritin Panel; Future  Hyperlipidemia associated with type 2 diabetes mellitus (HCC) -     Atorvastatin Calcium; Take 1 tablet (80 mg total) by mouth daily.  Dispense: 90 tablet; Refill: 0  Type 2  diabetes mellitus with hyperglycemia, with long-term current use of insulin (HCC) -     Dapagliflozin Propanediol; Take 1 tablet (10 mg total) by mouth daily.  Dispense: 90 tablet; Refill: 0 -     Hemoglobin A1c; Future  Depression, unspecified depression type -     DULoxetine HCl; Take 3 capsules (90 mg total) by mouth daily.  Dispense: 90 capsule; Refill: 0  Atherosclerosis of native coronary artery of native heart without angina pectoris -     Metoprolol Succinate ER; Take 1 tablet (25 mg total) by mouth daily.  Dispense: 90 tablet; Refill: 0 -     CBC; Future -     Comprehensive metabolic panel; Future -     Hemoglobin A1c; Future -     Lipid panel; Future  Other orders -     Aspirin; Take 1 tablet (81 mg total) by mouth daily. Swallow whole.  Dispense: 90 tablet; Refill: 0 -     Clopidogrel Bisulfate; Take 1 tablet (75 mg total) by mouth daily.  Dispense: 90 tablet; Refill: 0 -     Ezetimibe; Take 1 tablet (10 mg total) by mouth daily.  Dispense: 90 tablet; Refill: 0 -     Furosemide; Take 1 tablet (40 mg total) by mouth daily.  Dispense: 90 tablet; Refill: 0 -  Icosapent Ethyl; Take 2 capsules (2 g total) by mouth 2 (two) times daily.  Dispense: 360 capsule; Refill: 0 -     Ondansetron HCl; Take 1 tablet (4 mg total) by mouth every 8 (eight) hours as needed for nausea or vomiting.  Dispense: 30 tablet; Refill: 0 -     Potassium Chloride Crys ER; Take 1 tablet (20 mEq total) by mouth 2 (two) times daily.  Dispense: 180 tablet; Refill: 0     Return in about 2 months (around 11/22/2023) for DM.    Sandre Kitty, MD

## 2023-09-21 NOTE — Patient Instructions (Signed)
It was nice to see you today,  We addressed the following topics today: I have ordered refills on the only medications we discussed - I have ordered lab tests for you.  You can get these done at our clinic anytime in the mornings.  You need a lab visit appointment. - Follow-up with Lequita Halt in the next 2 to 3 months.  Have a great day,  Frederic Jericho, MD

## 2023-09-26 ENCOUNTER — Other Ambulatory Visit (HOSPITAL_COMMUNITY): Payer: Self-pay

## 2023-09-28 ENCOUNTER — Other Ambulatory Visit: Payer: 59

## 2023-09-28 DIAGNOSIS — D649 Anemia, unspecified: Secondary | ICD-10-CM | POA: Diagnosis not present

## 2023-09-28 DIAGNOSIS — I2510 Atherosclerotic heart disease of native coronary artery without angina pectoris: Secondary | ICD-10-CM

## 2023-09-28 DIAGNOSIS — Z794 Long term (current) use of insulin: Secondary | ICD-10-CM | POA: Diagnosis not present

## 2023-09-28 DIAGNOSIS — E1165 Type 2 diabetes mellitus with hyperglycemia: Secondary | ICD-10-CM | POA: Diagnosis not present

## 2023-09-29 ENCOUNTER — Other Ambulatory Visit (HOSPITAL_COMMUNITY): Payer: Self-pay

## 2023-09-29 LAB — COMPREHENSIVE METABOLIC PANEL
ALT: 8 [IU]/L (ref 0–32)
AST: 18 [IU]/L (ref 0–40)
Albumin: 4.4 g/dL (ref 3.9–4.9)
Alkaline Phosphatase: 64 [IU]/L (ref 44–121)
BUN/Creatinine Ratio: 19 (ref 9–23)
BUN: 17 mg/dL (ref 6–24)
Bilirubin Total: 0.3 mg/dL (ref 0.0–1.2)
CO2: 21 mmol/L (ref 20–29)
Calcium: 9.4 mg/dL (ref 8.7–10.2)
Chloride: 103 mmol/L (ref 96–106)
Creatinine, Ser: 0.88 mg/dL (ref 0.57–1.00)
Globulin, Total: 2.7 g/dL (ref 1.5–4.5)
Glucose: 174 mg/dL — ABNORMAL HIGH (ref 70–99)
Potassium: 4.2 mmol/L (ref 3.5–5.2)
Sodium: 142 mmol/L (ref 134–144)
Total Protein: 7.1 g/dL (ref 6.0–8.5)
eGFR: 81 mL/min/{1.73_m2} (ref >59)

## 2023-09-29 LAB — CBC
Hematocrit: 46.3 % (ref 34.0–46.6)
Hemoglobin: 14.2 g/dL (ref 11.1–15.9)
MCH: 28.3 pg (ref 26.6–33.0)
MCHC: 30.7 g/dL — ABNORMAL LOW (ref 31.5–35.7)
MCV: 92 fL (ref 79–97)
Platelets: 260 10*3/uL (ref 150–450)
RBC: 5.01 x10E6/uL (ref 3.77–5.28)
RDW: 13.4 % (ref 11.7–15.4)
WBC: 8.5 10*3/uL (ref 3.4–10.8)

## 2023-09-29 LAB — LIPID PANEL
Chol/HDL Ratio: 3.7 {ratio} (ref 0.0–4.4)
Cholesterol, Total: 93 mg/dL — ABNORMAL LOW (ref 100–199)
HDL: 25 mg/dL — ABNORMAL LOW (ref >39)
LDL Chol Calc (NIH): 43 mg/dL (ref 0–99)
Triglycerides: 140 mg/dL (ref 0–149)
VLDL Cholesterol Cal: 25 mg/dL (ref 5–40)

## 2023-09-29 LAB — IRON,TIBC AND FERRITIN PANEL
Ferritin: 87 ng/mL (ref 15–150)
Iron Saturation: 15 % (ref 15–55)
Iron: 47 ug/dL (ref 27–159)
Total Iron Binding Capacity: 322 ug/dL (ref 250–450)
UIBC: 275 ug/dL (ref 131–425)

## 2023-09-29 LAB — HEMOGLOBIN A1C
Est. average glucose Bld gHb Est-mCnc: 131 mg/dL
Hgb A1c MFr Bld: 6.2 % — ABNORMAL HIGH (ref 4.8–5.6)

## 2023-10-06 ENCOUNTER — Other Ambulatory Visit (HOSPITAL_COMMUNITY): Payer: Self-pay

## 2023-10-21 NOTE — Progress Notes (Signed)
 [address]       [place] [address]             [phone]    HPI: Renee Ho is a 48 year old woman with a past medical history of CAD, ST elevation MI, hypertension, diabetes, obstructive sleep apnea, obesity, hyperlipidemia, tobacco use, anxiety, and depression. The patient returns for routine postoperative follow-up having undergone CABG x 4 by Dr. Kerrin on 11/08. The patient's early postoperative recovery while in the hospital was notable for a run of SVT for which she was started on Amiodarone . She was discharged to CIR in stable condition on 11/13 and was discharged from there on 11/20. She followed up with Dr. Kerrin on 12/10 and was noted to be doing well. 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 She followed up with cardiology on 12/10 and her Amiodarone  was discontinued due to nausea.  Since hospital discharge the patient denies chest pain, shortness of breath, lower extremity swelling, dizziness and LOC. She does admit to chest soreness but is only taking Tylenol . She does admit that although she is no longer severely nauseous since the Amiodarone  was discontinued she still does not have an appetite but attributes this mainly to stress. She has lost 50 lbs since discharge from the hospital.    Current Outpatient Medications  Medication Sig Dispense Refill   acetaminophen  (TYLENOL ) 325 MG tablet Take 1-2 tablets (325-650 mg total) by mouth every 4 (four) hours as needed. 100 tablet 0   aspirin  EC 81 MG tablet Take 1 tablet (81 mg total) by mouth daily. Swallow whole. 90 tablet 0   atorvastatin  (LIPITOR ) 80 MG tablet Take 1 tablet (80 mg total) by mouth daily. 90 tablet 0   Blood Glucose Monitoring Suppl (BLOOD GLUCOSE MONITOR SYSTEM) w/Device KIT Use up to four times daily as directed. 1 kit 0   clopidogrel  (PLAVIX ) 75 MG tablet Take 1 tablet (75 mg total) by mouth daily. 90 tablet 0   dapagliflozin  propanediol  (FARXIGA ) 10 MG TABS tablet Take 1 tablet (10 mg total) by mouth daily. 90 tablet 0   DULoxetine  (CYMBALTA ) 30 MG capsule Take 3 capsules (90 mg total) by mouth daily. 90 capsule 0   ezetimibe  (ZETIA ) 10 MG tablet Take 1 tablet (10 mg total) by mouth daily. 90 tablet 0   furosemide  (LASIX ) 40 MG tablet Take 1 tablet (40 mg total) by mouth daily. 90 tablet 0   glucose blood test strip Use as directed. 100 each 0   glucose blood test strip Use as directed. 100 each 0   icosapent  Ethyl (VASCEPA ) 1 g capsule Take 2 capsules (2 g total) by mouth 2 (two) times daily. 360 capsule 0   Lancets (ONETOUCH DELICA PLUS LANCET30G) MISC Use as directed. 100 each 0   metFORMIN  (GLUCOPHAGE ) 1000 MG tablet Take 1 tablet (1,000 mg total) by mouth 2 (two) times daily with a meal. 180 tablet 1   metoprolol  succinate (TOPROL  XL) 25 MG 24 hr tablet Take 1 tablet (25 mg total) by mouth daily. 90 tablet 0   ondansetron  (ZOFRAN ) 4 MG tablet Take 1 tablet (4 mg total) by mouth every 8 (eight) hours as needed for nausea or vomiting. 30 tablet 0   oxyCODONE  (OXY IR/ROXICODONE ) 5 MG immediate release tablet Take 1 tablet (5 mg total) by mouth every 6 (six) hours as needed for severe pain (pain score 7-10). 28 tablet  0   potassium chloride  SA (KLOR-CON  M) 20 MEQ tablet Take 1 tablet (20 mEq total) by mouth 2 (two) times daily. 180 tablet 0   tirzepatide  (MOUNJARO ) 7.5 MG/0.5ML Pen Inject 7.5 mg into the skin once a week. 6 mL 1   traMADol  (ULTRAM ) 50 MG tablet Take 1 tablet (50 mg total) by mouth every 6 (six) hours as needed for severe pain (pain score 7-10) or moderate pain (pain score 4-6). 28 tablet 0   No current facility-administered medications for this visit.   Vitals: Today's Vitals   10/25/23 1455  BP: (!) 153/91  Pulse: 94  Resp: 18  SpO2: 96%  Weight: 231 lb (104.8 kg)  Height: 5' 5 (1.651 m)   Body mass index is 38.44 kg/m.  Physical Exam: General: Alert and oriented, no acute distress Neuro: Grossly  intact CV: Regular rate and rhythm, no murmur Pulm: Clear to auscultation bilaterally GI: +BS, nontender, no distension Extremities: No edema Wound: Healing well without sign of infection. Sternum is stable.   Diagnostic Tests: Narrative & Impression  CLINICAL DATA:  Postop CABG.  No current complaints.   EXAM: CHEST - 2 VIEW   COMPARISON:  Radiographs 08/22/2023 and 08/21/2023.   FINDINGS: Interval improved aeration of both lungs which are now clear. The heart size and mediastinal contours are normal status post median sternotomy and CABG. No pleural effusion or pneumothorax. The sternotomy wires are intact. No acute osseous findings are seen.   IMPRESSION: Interval improved aeration of both lungs. No acute cardiopulmonary process.     Electronically Signed   By: Elsie Perone M.D.   On: 09/20/2023 14:49      Impression/Plan: S/P CABG: The patient is overall progressing well from surgery. She does admit to a lot of stress at home due to her divorce and her husband trying to take the house. Her blood pressure is elevated in clinic today but was well controlled at her last family medicine appointment. Recommended she get a blood pressure cuff and monitor this at home to bring to her cardiologist at her next appointment. She denies shortness of breath and is ambulating well. Her last CXR showed no infiltrates or effusions. Will not make any medication changes at this time. Commended her on recent weight loss but discussed the importance of proper nutrition for healing. She does admit to incisional tenderness but the sternum is stable to palpation. She continues to take Tylenol  for this. She works as a Doctor, [employer] for work and does not feel she will be ready to do this in 1 month. Will clear her on February 3rd for light duty for 1 month and then she can return to normal activity after that. We discussed no heavy lifting until 3 months postop. She has been cleared for cardiac  rehab. Plan to have her return to the clinic as needed.   Con JAYSON Helm, PA-C [facility] [phone]

## 2023-10-25 ENCOUNTER — Ambulatory Visit (INDEPENDENT_AMBULATORY_CARE_PROVIDER_SITE_OTHER): Payer: Self-pay | Admitting: Physician Assistant

## 2023-10-25 ENCOUNTER — Ambulatory Visit: Payer: 59 | Admitting: Thoracic Surgery (Cardiothoracic Vascular Surgery)

## 2023-10-25 VITALS — BP 153/91 | HR 94 | Resp 18 | Ht 65.0 in | Wt 231.0 lb

## 2023-10-25 DIAGNOSIS — Z951 Presence of aortocoronary bypass graft: Secondary | ICD-10-CM

## 2023-10-25 NOTE — Patient Instructions (Signed)
 Continue to avoid any heavy lifting or strenuous use of your arms or shoulders for at least a total of three months from the time of surgery.  After three months you may gradually increase how much you lift or otherwise use your arms or chest as tolerated, with limits based upon whether or not activities lead to the return of significant discomfort.  You are encouraged to enroll and participate in the outpatient cardiac rehab program beginning as soon as practical.  You may return to driving an automobile as long as you are no longer requiring oral narcotic pain relievers during the daytime.  It would be wise to start driving only short distances during the daylight and gradually increase from there as you feel comfortable.  You may return to work light duty on Feb 3 and return to normal activity on March 3

## 2023-10-27 ENCOUNTER — Ambulatory Visit: Payer: 59 | Admitting: Physician Assistant

## 2023-10-31 ENCOUNTER — Other Ambulatory Visit (HOSPITAL_COMMUNITY): Payer: Self-pay

## 2023-11-01 ENCOUNTER — Telehealth (HOSPITAL_COMMUNITY): Payer: Self-pay

## 2023-11-01 NOTE — Telephone Encounter (Signed)
Called and spoke with pt in regards to CR, pt stated she is not interested at this time.   Closed referral

## 2023-11-02 ENCOUNTER — Other Ambulatory Visit (HOSPITAL_COMMUNITY): Payer: Self-pay

## 2023-11-17 ENCOUNTER — Encounter: Payer: Self-pay | Admitting: Family Medicine

## 2023-11-22 ENCOUNTER — Other Ambulatory Visit: Payer: Self-pay

## 2023-11-22 ENCOUNTER — Other Ambulatory Visit (HOSPITAL_COMMUNITY): Payer: Self-pay

## 2023-11-22 ENCOUNTER — Ambulatory Visit: Payer: Commercial Managed Care - PPO | Admitting: Family Medicine

## 2023-11-22 ENCOUNTER — Encounter: Payer: Self-pay | Admitting: Family Medicine

## 2023-11-22 VITALS — BP 126/77 | HR 62 | Ht 65.0 in | Wt 225.0 lb

## 2023-11-22 DIAGNOSIS — E785 Hyperlipidemia, unspecified: Secondary | ICD-10-CM

## 2023-11-22 DIAGNOSIS — I2510 Atherosclerotic heart disease of native coronary artery without angina pectoris: Secondary | ICD-10-CM

## 2023-11-22 DIAGNOSIS — F32A Depression, unspecified: Secondary | ICD-10-CM | POA: Diagnosis not present

## 2023-11-22 DIAGNOSIS — Z23 Encounter for immunization: Secondary | ICD-10-CM | POA: Diagnosis not present

## 2023-11-22 DIAGNOSIS — Z794 Long term (current) use of insulin: Secondary | ICD-10-CM | POA: Diagnosis not present

## 2023-11-22 DIAGNOSIS — E1169 Type 2 diabetes mellitus with other specified complication: Secondary | ICD-10-CM | POA: Diagnosis not present

## 2023-11-22 DIAGNOSIS — E1165 Type 2 diabetes mellitus with hyperglycemia: Secondary | ICD-10-CM

## 2023-11-22 LAB — POCT UA - MICROALBUMIN
Creatinine, POC: 10 mg/dL
Microalbumin Ur, POC: 10 mg/L

## 2023-11-22 MED ORDER — DULOXETINE HCL 30 MG PO CPEP
90.0000 mg | ORAL_CAPSULE | Freq: Every day | ORAL | 0 refills | Status: DC
  Filled 2023-11-22: qty 90, 30d supply, fill #0

## 2023-11-22 MED ORDER — EZETIMIBE 10 MG PO TABS
10.0000 mg | ORAL_TABLET | Freq: Every day | ORAL | 0 refills | Status: DC
  Filled 2023-11-22 – 2024-01-24 (×2): qty 90, 90d supply, fill #0

## 2023-11-22 MED ORDER — METOPROLOL SUCCINATE ER 25 MG PO TB24
25.0000 mg | ORAL_TABLET | Freq: Every day | ORAL | 0 refills | Status: DC
  Filled 2023-11-22 – 2024-01-24 (×2): qty 90, 90d supply, fill #0

## 2023-11-22 MED ORDER — ICOSAPENT ETHYL 1 G PO CAPS
2.0000 g | ORAL_CAPSULE | Freq: Two times a day (BID) | ORAL | 0 refills | Status: DC
  Filled 2023-11-22 – 2024-01-02 (×2): qty 360, 90d supply, fill #0

## 2023-11-22 MED ORDER — DAPAGLIFLOZIN PROPANEDIOL 10 MG PO TABS
10.0000 mg | ORAL_TABLET | Freq: Every day | ORAL | 0 refills | Status: DC
  Filled 2023-11-22 – 2023-11-24 (×2): qty 30, 30d supply, fill #0
  Filled 2024-01-24: qty 30, 30d supply, fill #1
  Filled 2024-02-20: qty 30, 30d supply, fill #2

## 2023-11-22 MED ORDER — TIRZEPATIDE 10 MG/0.5ML ~~LOC~~ SOAJ
10.0000 mg | SUBCUTANEOUS | 1 refills | Status: DC
  Filled 2023-11-22: qty 2, 28d supply, fill #0
  Filled 2024-01-02: qty 2, 28d supply, fill #1
  Filled 2024-01-24 – 2024-01-25 (×2): qty 2, 28d supply, fill #2
  Filled 2024-02-20: qty 2, 28d supply, fill #3

## 2023-11-22 MED ORDER — ASPIRIN 81 MG PO TBEC
81.0000 mg | DELAYED_RELEASE_TABLET | Freq: Every day | ORAL | 0 refills | Status: DC
  Filled 2023-11-22 – 2024-01-24 (×2): qty 90, 90d supply, fill #0

## 2023-11-22 MED ORDER — CLOPIDOGREL BISULFATE 75 MG PO TABS
75.0000 mg | ORAL_TABLET | Freq: Every day | ORAL | 0 refills | Status: DC
  Filled 2023-11-22 – 2024-01-24 (×2): qty 90, 90d supply, fill #0

## 2023-11-22 MED ORDER — METFORMIN HCL 1000 MG PO TABS
1000.0000 mg | ORAL_TABLET | Freq: Every day | ORAL | 1 refills | Status: DC
  Filled 2023-11-22: qty 90, 90d supply, fill #0

## 2023-11-22 MED ORDER — ATORVASTATIN CALCIUM 80 MG PO TABS
80.0000 mg | ORAL_TABLET | Freq: Every day | ORAL | 0 refills | Status: DC
Start: 2023-11-22 — End: 2024-03-20
  Filled 2023-11-22 – 2024-01-24 (×2): qty 90, 90d supply, fill #0

## 2023-11-22 NOTE — Patient Instructions (Addendum)
Decrease your metformin dose to 1000 mg just in the morning. Increase Mounjaro to 10 mg weekly.  When you feel ready, please reach out to [facility] to schedule your colonoscopy: [phone] Monday-Friday 8 am-5 pm to assist you in your scheduling needs

## 2023-11-22 NOTE — Progress Notes (Signed)
Established Patient Office Visit  Subjective   Patient ID: Renee Ho, female    DOB: August 28, 1976  Age: 48 y.o. MRN: [id number]  Chief Complaint  Patient presents with   Diabetes    HPI Renee Ho is a 48 y.o. female presenting today for follow up of diabetes.  Overall she is doing well, no side effects with Farxiga, metformin, or Mounjaro.  She reports that she does still feel fatigued particularly after 4-hour shifts now that she is getting back to work.  This has been the case ever since her heart attack and subsequent CABG procedures.  Outpatient Medications Prior to Visit  Medication Sig   Blood Glucose Monitoring Suppl (BLOOD GLUCOSE MONITOR SYSTEM) w/Device KIT Use up to four times daily as directed.   furosemide (LASIX) 40 MG tablet Take 1 tablet (40 mg total) by mouth daily.   glucose blood test strip Use as directed.   glucose blood test strip Use as directed.   Lancets (ONETOUCH DELICA PLUS LANCET30G) MISC Use as directed.   potassium chloride SA (KLOR-CON M) 20 MEQ tablet Take 1 tablet (20 mEq total) by mouth 2 (two) times daily.   [DISCONTINUED] aspirin EC 81 MG tablet Take 1 tablet (81 mg total) by mouth daily. Swallow whole.   [DISCONTINUED] atorvastatin (LIPITOR) 80 MG tablet Take 1 tablet (80 mg total) by mouth daily.   [DISCONTINUED] clopidogrel (PLAVIX) 75 MG tablet Take 1 tablet (75 mg total) by mouth daily.   [DISCONTINUED] dapagliflozin propanediol (FARXIGA) 10 MG TABS tablet Take 1 tablet (10 mg total) by mouth daily.   [DISCONTINUED] DULoxetine (CYMBALTA) 30 MG capsule Take 3 capsules (90 mg total) by mouth daily.   [DISCONTINUED] ezetimibe (ZETIA) 10 MG tablet Take 1 tablet (10 mg total) by mouth daily.   [DISCONTINUED] icosapent Ethyl (VASCEPA) 1 g capsule Take 2 capsules (2 g total) by mouth 2 (two) times daily.   [DISCONTINUED] metFORMIN (GLUCOPHAGE) 1000 MG tablet Take 1 tablet (1,000 mg total) by mouth 2 (two) times daily with  a meal.   [DISCONTINUED] metoprolol succinate (TOPROL XL) 25 MG 24 hr tablet Take 1 tablet (25 mg total) by mouth daily.   [DISCONTINUED] tirzepatide (MOUNJARO) 7.5 MG/0.5ML Pen Inject 7.5 mg into the skin once a week.   [DISCONTINUED] acetaminophen (TYLENOL) 325 MG tablet Take 1-2 tablets (325-650 mg total) by mouth every 4 (four) hours as needed. (Patient not taking: Reported on 11/22/2023)   [DISCONTINUED] ondansetron (ZOFRAN) 4 MG tablet Take 1 tablet (4 mg total) by mouth every 8 (eight) hours as needed for nausea or vomiting. (Patient not taking: Reported on 11/22/2023)   [DISCONTINUED] oxyCODONE (OXY IR/ROXICODONE) 5 MG immediate release tablet Take 1 tablet (5 mg total) by mouth every 6 (six) hours as needed for severe pain (pain score 7-10). (Patient not taking: Reported on 11/22/2023)   [DISCONTINUED] traMADol (ULTRAM) 50 MG tablet Take 1 tablet (50 mg total) by mouth every 6 (six) hours as needed for severe pain (pain score 7-10) or moderate pain (pain score 4-6). (Patient not taking: Reported on 11/22/2023)   No facility-administered medications prior to visit.    ROS Negative unless otherwise noted in HPI   Objective:     BP 126/77   Pulse 62   Ht 5\' 5"  (1.651 m)   Wt 225 lb (102.1 kg)   SpO2 98%   BMI 37.44 kg/m   Physical Exam Constitutional:      General: She is not in acute distress.  Appearance: Normal appearance.  HENT:     Head: Normocephalic and atraumatic.  Cardiovascular:     Rate and Rhythm: Normal rate and regular rhythm.     Heart sounds: No murmur heard.    No friction rub. No gallop.  Pulmonary:     Effort: Pulmonary effort is normal. No respiratory distress.     Breath sounds: No wheezing, rhonchi or rales.  Skin:    General: Skin is warm and dry.  Neurological:     Mental Status: She is alert and oriented to person, place, and time.    Results for orders placed or performed in visit on 11/22/23  POCT UA - Microalbumin  Result Value Ref Range    Microalbumin Ur, POC 10 mg/L   Creatinine, POC 10 mg/dL   Albumin/Creatinine Ratio, Urine, POC 30mg /g      Assessment & Plan:  Type 2 diabetes mellitus with hyperglycemia, with long-term current use of insulin (HCC) Assessment & Plan: Most recent A1c 6.2.  Continue Farxiga 10 mg daily, metformin 1000 mg twice daily, Mounjaro 7.5 mg weekly.  Urine microalbumin updated today.  Will continue to monitor.  Orders: -     POCT UA - Microalbumin -     Tirzepatide; Inject 10 mg into the skin once a week.  Dispense: 6 mL; Refill: 1 -     Dapagliflozin Propanediol; Take 1 tablet (10 mg total) by mouth daily.  Dispense: 90 tablet; Refill: 0 -     metFORMIN HCl; Take 1 tablet (1,000 mg total) by mouth daily with breakfast.  Dispense: 90 tablet; Refill: 1  Atherosclerosis of native coronary artery of native heart without angina pectoris -     Metoprolol Succinate ER; Take 1 tablet (25 mg total) by mouth daily.  Dispense: 90 tablet; Refill: 0  Depression, unspecified depression type -     DULoxetine HCl; Take 3 capsules (90 mg total) by mouth daily.  Dispense: 90 capsule; Refill: 0  Hyperlipidemia associated with type 2 diabetes mellitus (HCC) -     Atorvastatin Calcium; Take 1 tablet (80 mg total) by mouth daily.  Dispense: 90 tablet; Refill: 0 -     Ezetimibe; Take 1 tablet (10 mg total) by mouth daily.  Dispense: 90 tablet; Refill: 0  Vaccine for diphtheria-tetanus-pertussis, combined -     Tdap vaccine greater than or equal to 7yo IM  Other orders -     Aspirin; Take 1 tablet (81 mg total) by mouth daily. Swallow whole.  Dispense: 90 tablet; Refill: 0 -     Clopidogrel Bisulfate; Take 1 tablet (75 mg total) by mouth daily.  Dispense: 90 tablet; Refill: 0 -     Icosapent Ethyl; Take 2 capsules (2 g total) by mouth 2 (two) times daily.  Dispense: 360 capsule; Refill: 0    Return in about 3 months (around 02/19/2024) for annual physical, fasting labs 1 week before.    Melida Quitter,  PA

## 2023-11-22 NOTE — Assessment & Plan Note (Signed)
Most recent A1c 6.2.  Continue Farxiga 10 mg daily, metformin 1000 mg twice daily, Mounjaro 7.5 mg weekly.  Urine microalbumin updated today.  Will continue to monitor.

## 2023-11-24 ENCOUNTER — Telehealth: Payer: Self-pay | Admitting: Cardiology

## 2023-11-24 ENCOUNTER — Other Ambulatory Visit (HOSPITAL_COMMUNITY): Payer: Self-pay

## 2023-11-24 NOTE — Telephone Encounter (Signed)
Paper Work Dropped Off: ADA paperwork   Date: 11/23/2022  Location of paper:  Dr. Odis Hollingshead box

## 2023-11-24 NOTE — Telephone Encounter (Signed)
Sure. Thanks for the update.   Ennis Delpozo Florence, DO, [facility]

## 2023-11-24 NOTE — Telephone Encounter (Signed)
Patient would like to switch providers from Dr. Odis Hollingshead to Dr. Jacinto Halim, due to patient preference.

## 2023-11-26 NOTE — Telephone Encounter (Signed)
Spoke with pt on the phone and explained that d/t this ADA paperwork being directly correlated to the bypass surgery she had and the limitations from that surgery, CT surgery would need to be the ones to complete this paperwork. Pt verbalized understanding. Explained to pt that we would fax this paperwork to Charlett Lango, MD office to be completed.   Paperwork faxed to Charlett Lango, MD office to MD's attention- transmission read success for 2/13.

## 2023-11-27 NOTE — Telephone Encounter (Signed)
 sure

## 2023-12-26 ENCOUNTER — Ambulatory Visit: Payer: 59 | Admitting: Cardiology

## 2024-01-02 ENCOUNTER — Other Ambulatory Visit: Payer: Self-pay

## 2024-01-02 ENCOUNTER — Other Ambulatory Visit (HOSPITAL_COMMUNITY): Payer: Self-pay

## 2024-01-05 NOTE — Progress Notes (Unsigned)
 Cardiology Office Note:  .   Date:  01/06/2024  ID:  Renee Ho, DOB Oct 30, 1975, MRN [id number] PCP: Melida Quitter, PA  [facility] Providers Cardiologist:  Yates Decamp, MD   History of Present Illness: .   Renee Ho is a 48 y.o. with a past medical history of DM-II, HLD, pancreatitis related to Ozempic but now tolerating Mounjaro, CAD with history of inferior STEMI underwent stenting to CX followed by CABG 3 months later due to multivessel disease (Nov 2024 LIMA to LAD, SVG SEQUENTIALLY to DIAGONAL 2 and 1, SVG to PDA), h/o tobacco abuse quit after MI, OSA on CPAP and obesity.  Discussed the use of AI scribe software for clinical note transcription with the patient, who gave verbal consent to proceed.  History of Present Illness [name], a 48 year old with a history of type 2 diabetes, hypercholesterolemia, chronic pancreatitis, and coronary artery disease, presents with ongoing fatigue and exhaustion. She reports that these symptoms have progressively worsened since her bypass surgery in November 2024. 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 She is concerned about her ability to continue working these long hours due to her fatigue. She has also lost 60 pounds since her heart attack in October 2024 and is currently on [facility] for her diabetes, which she reports has helped control her blood sugar levels. She denies any current smoking and uses a CPAP machine regularly for sleep apnea. She also reports a history of a heart attack in October 2024, which was initially mistaken for indigestion. She underwent stenting followed by bypass surgery due to multivessel disease.  Labs   Lab Results  Component Value Date   CHOL 93 (L) 09/28/2023   HDL 25 (L) 09/28/2023   LDLCALC 43 09/28/2023   LDLDIRECT 123 (H) 07/23/2023   TRIG 140 09/28/2023   CHOLHDL 3.7 09/28/2023   Lab Results  Component  Value Date   NA 142 09/28/2023   K 4.2 09/28/2023   CO2 21 09/28/2023   GLUCOSE 174 (H) 09/28/2023   BUN 17 09/28/2023   CREATININE 0.88 09/28/2023   CALCIUM 9.4 09/28/2023   EGFR 81 09/28/2023   GFRNONAA 60 (L) 08/30/2023      Latest Ref Rng & Units 09/28/2023    8:52 AM 08/30/2023    6:37 AM 08/29/2023    6:24 AM  BMP  Glucose 70 - 99 mg/dL 213  086  578   BUN 6 - 24 mg/dL 17  14  13    Creatinine 0.57 - 1.00 mg/dL 4.69  6.29  5.28   BUN/Creat Ratio 9 - 23 19     Sodium 134 - 144 mmol/L 142  139  136   Potassium 3.5 - 5.2 mmol/L 4.2  3.9  4.0   Chloride 96 - 106 mmol/L 103  99  97   CO2 20 - 29 mmol/L 21  33  31   Calcium 8.7 - 10.2 mg/dL 9.4  8.2  8.4       Latest Ref Rng & Units 09/28/2023    8:52 AM 08/30/2023    6:37 AM 08/29/2023    6:24 AM  CBC  WBC 3.4 - 10.8 x10E3/uL 8.5  8.7  8.1   Hemoglobin 11.1 - 15.9 g/dL 41.3  8.6  8.8   Hematocrit 34.0 - 46.6 % 46.3  27.4  27.9   Platelets 150 - 450 x10E3/uL 260  250  242    Lab  Results  Component Value Date   HGBA1C 6.2 (H) 09/28/2023    Lab Results  Component Value Date   TSH 2.174 08/24/2023    Review of Systems  Constitutional: Positive for malaise/fatigue.  Cardiovascular:  Negative for chest pain, dyspnea on exertion and leg swelling.   Physical Exam:   VS:  BP 112/74 (BP Location: Left Arm, Patient Position: Sitting, Cuff Size: Large)   Pulse 76   Resp 16   Ht 5\' 5"  (1.651 m)   Wt 220 lb 6.4 oz (100 kg)   SpO2 98%   BMI 36.68 kg/m    Wt Readings from Last 3 Encounters:  01/06/24 220 lb 6.4 oz (100 kg)  11/22/23 225 lb (102.1 kg)  10/25/23 231 lb (104.8 kg)    Physical Exam Constitutional:      Appearance: She is obese.  Neck:     Vascular: No carotid bruit or JVD.  Cardiovascular:     Rate and Rhythm: Normal rate and regular rhythm.     Pulses: Intact distal pulses.     Heart sounds: Normal heart sounds. No murmur heard.    No gallop.  Pulmonary:     Effort: Pulmonary effort is normal.      Breath sounds: Normal breath sounds.  Abdominal:     General: Bowel sounds are normal.     Palpations: Abdomen is soft.  Musculoskeletal:     Right lower leg: No edema.     Left lower leg: No edema.    Studies Reviewed: [name]    ECHOCARDIOGRAM LIMITED 08/16/2023  1. Left ventricular ejection fraction, by estimation, is 60 to 65%. The left ventricle has normal function. The left ventricle has no regional wall motion abnormalities. There is moderate asymmetric left ventricular hypertrophy of the basal-septal segment. 2. The mitral valve is grossly normal. Trivial mitral valve regurgitation. 3. The aortic valve is grossly normal. Aortic valve regurgitation is not visualized. 4. The inferior vena cava is normal in size with greater than 50% respiratory variability, suggesting right atrial pressure of 3 mmHg.  Coronary angiogram 07/23/2023: Stenting of prox Cx with SYNERGY XD 3.0X12.      Rec: CABG after 3 months of DAPT.  CABG 08/19/2023: LIMA to LAD, SVG to D1-2, SVG to PDA.  EKG:         Medications and allergies    Allergies  Allergen Reactions   Morphine And Codeine Other (See Comments)    Migraines   Reglan [Metoclopramide] Other (See Comments)    Psychotic episodes   Chantix [Varenicline] Other (See Comments)    Makes the patient angry feeling   Toradol [Ketorolac Tromethamine] Rash     Current Outpatient Medications:    aspirin EC 81 MG tablet, Take 1 tablet (81 mg total) by mouth daily. Swallow whole., Disp: 90 tablet, Rfl: 0   atorvastatin (LIPITOR) 80 MG tablet, Take 1 tablet (80 mg total) by mouth daily., Disp: 90 tablet, Rfl: 0   Blood Glucose Monitoring Suppl (BLOOD GLUCOSE MONITOR SYSTEM) w/Device KIT, Use up to four times daily as directed., Disp: 1 kit, Rfl: 0   clopidogrel (PLAVIX) 75 MG tablet, Take 1 tablet (75 mg total) by mouth daily., Disp: 90 tablet, Rfl: 0   dapagliflozin propanediol (FARXIGA) 10 MG TABS tablet, Take 1 tablet (10 mg total) by  mouth daily., Disp: 90 tablet, Rfl: 0   DULoxetine (CYMBALTA) 30 MG capsule, Take 3 capsules (90 mg total) by mouth daily., Disp: 90 capsule, Rfl: 0   ezetimibe (  ZETIA) 10 MG tablet, Take 1 tablet (10 mg total) by mouth daily., Disp: 90 tablet, Rfl: 0   glucose blood test strip, Use as directed., Disp: 100 each, Rfl: 0   glucose blood test strip, Use as directed., Disp: 100 each, Rfl: 0   icosapent Ethyl (VASCEPA) 1 g capsule, Take 2 capsules (2 g total) by mouth 2 (two) times daily., Disp: 360 capsule, Rfl: 0   Lancets (ONETOUCH DELICA PLUS LANCET30G) MISC, Use as directed., Disp: 100 each, Rfl: 0   metFORMIN (GLUCOPHAGE) 1000 MG tablet, Take 1 tablet (1,000 mg total) by mouth daily with breakfast., Disp: 90 tablet, Rfl: 1   metoprolol succinate (TOPROL XL) 25 MG 24 hr tablet, Take 1 tablet (25 mg total) by mouth daily., Disp: 90 tablet, Rfl: 0   tirzepatide (MOUNJARO) 10 MG/0.5ML Pen, Inject 10 mg into the skin once a week., Disp: 6 mL, Rfl: 1   ASSESSMENT AND PLAN: .      ICD-10-CM   1. Atherosclerosis of native coronary artery of native heart without angina pectoris  I25.10     2. Essential hypertension  I10     3. Primary hypertension  I10     4. Hyperlipidemia LDL goal <55  E78.5       No orders of the defined types were placed in this encounter.   Medications Discontinued During This Encounter  Medication Reason   potassium chloride SA (KLOR-CON M) 20 MEQ tablet No longer needed (for PRN medications)   furosemide (LASIX) 40 MG tablet Discontinued by provider     1. Atherosclerosis of native coronary artery of native heart without angina pectoris (Primary) Patient presented with NSTEMI for which he underwent angioplasty and stenting to circumflex coronary artery in September 2024 followed by CABG x 3 on 08/19/2023 as previously planned due to high-grade stenosis in the proximal LAD and diagonal vessels.  She presently remains asymptomatic without recurrence of angina pectoris.   She is on appropriate medical therapy and is on DAPT which will be continued for at least a total of 1 year and we will switch her to either aspirin or Plavix alone.  2. Essential hypertension Her blood pressure is well-controlled, in fact her blood pressure is very soft and she is only on metoprolol succinate 25 mg daily.  She is also on Lasix 20 mg along with potassium supplements, no clinical evidence of heart failure which we will discontinue Lasix and potassium for now.  Will consider initiation of an ACE inhibitor or an ARB at a later date.  On follow-up with the PCP, if blood pressure is stable, I will request her to start her on ACE inhibitor or an ARB  3. Primary hypertension Blood pressure is well-controlled on metoprolol succinate 25 mg daily, she is also on Farxiga which is also helping with blood pressure control.  There is no clinical evidence of heart failure.  Please see above.  4. Hyperlipidemia LDL goal <55 I reviewed her lipids, LDL is at goal, however patient is experiencing marked fatigue.  She is working as an Public house manager, having difficulty with work, advised her to have a trial of statin holiday for 3 weeks and if symptoms improve, we will reduce the dose of atorvastatin to 1/2 tablet and she will message [place] on MyChart and we can recheck her lipids on her next office visit in 4 months.  However if there is no change in her fatigue, to continue and resume 80 mg of Lipitor along with Zetia that  she is on.  Hopefully discontinuation of Lasix will also help and improve her fatigue.  I have congratulated her on having lost about 40 to 50 pounds in weight over the past 4 months since being on Mounjaro.  Continues to do well.  She has remained abstinent from tobacco use.   Signed,  Yates Decamp, MD, [facility] 01/06/2024, 9:52 AM [facility] [address] [place], [place] [address] Phone: [phone]. Fax:  [phone]

## 2024-01-06 ENCOUNTER — Ambulatory Visit: Payer: Commercial Managed Care - PPO | Attending: Internal Medicine | Admitting: Cardiology

## 2024-01-06 ENCOUNTER — Encounter: Payer: Self-pay | Admitting: Cardiology

## 2024-01-06 VITALS — BP 112/74 | HR 76 | Resp 16 | Ht 65.0 in | Wt 220.4 lb

## 2024-01-06 DIAGNOSIS — I2510 Atherosclerotic heart disease of native coronary artery without angina pectoris: Secondary | ICD-10-CM

## 2024-01-06 DIAGNOSIS — E785 Hyperlipidemia, unspecified: Secondary | ICD-10-CM

## 2024-01-06 DIAGNOSIS — I10 Essential (primary) hypertension: Secondary | ICD-10-CM | POA: Diagnosis not present

## 2024-01-06 NOTE — Patient Instructions (Addendum)
 Medication Instructions:  Your physician has recommended you make the following change in your medication: Stop furosemide Stop potassium  *If you need a refill on your cardiac medications before your next appointment, please call your pharmacy*  Lab Work: none If you have labs (blood work) drawn today and your tests are completely normal, you will receive your results only by: MyChart Message (if you have MyChart) OR A paper copy in the mail If you have any lab test that is abnormal or we need to change your treatment, we will call you to review the results.  Testing/Procedures: none  Follow-Up: At [facility], you and your health needs are our priority.  As part of our continuing mission to provide you with exceptional heart care, our providers are all part of one team.  This team includes your primary Cardiologist (physician) and Advanced Practice Providers or APPs (Physician Assistants and Nurse Practitioners) who all work together to provide you with the care you need, when you need it.  Your next appointment:   July 29 at 9 AM  Provider:   Yates Decamp, MD     We recommend signing up for the patient portal called "MyChart".  Sign up information is provided on this After Visit Summary.  MyChart is used to connect with patients for Virtual Visits (Telemedicine).  Patients are able to view lab/test results, encounter notes, upcoming appointments, etc.  Non-urgent messages can be sent to your provider as well.   To learn more about what you can do with MyChart, go to [email or web address].   Other Instructions Hold Atorvastatin for 3 weeks.  Resume if no change in fatigue.  If fatigue improves resume Atorvastatin at half your current dose and let us know.      1st Floor: - Lobby - Registration  - Pharmacy  - Lab - Cafe  2nd Floor: - PV Lab - Diagnostic Testing (echo, CT, nuclear med)  3rd Floor: - Vacant  4th Floor: - TCTS (cardiothoracic surgery) - AFib  Clinic - [facility] Heart Clinic - Vascular Surgery  - Vascular Ultrasound  5th Floor: - HeartCare Cardiology (general and EP) - Clinical Pharmacy for coumadin, hypertension, lipid, weight-loss medications, and med management appointments    Valet parking services will be available as well.

## 2024-01-24 ENCOUNTER — Other Ambulatory Visit: Payer: Self-pay

## 2024-01-24 ENCOUNTER — Other Ambulatory Visit: Payer: Self-pay | Admitting: Family Medicine

## 2024-01-24 ENCOUNTER — Other Ambulatory Visit (HOSPITAL_COMMUNITY): Payer: Self-pay

## 2024-01-24 ENCOUNTER — Other Ambulatory Visit (HOSPITAL_BASED_OUTPATIENT_CLINIC_OR_DEPARTMENT_OTHER): Payer: Self-pay

## 2024-01-24 DIAGNOSIS — F32A Depression, unspecified: Secondary | ICD-10-CM

## 2024-01-25 MED ORDER — DULOXETINE HCL 30 MG PO CPEP
90.0000 mg | ORAL_CAPSULE | Freq: Every day | ORAL | 0 refills | Status: DC
  Filled 2024-01-25: qty 90, 30d supply, fill #0

## 2024-01-26 ENCOUNTER — Other Ambulatory Visit (HOSPITAL_COMMUNITY): Payer: Self-pay

## 2024-01-31 ENCOUNTER — Other Ambulatory Visit (HOSPITAL_COMMUNITY): Payer: Self-pay

## 2024-03-09 ENCOUNTER — Other Ambulatory Visit: Payer: Self-pay | Admitting: *Deleted

## 2024-03-09 DIAGNOSIS — I10 Essential (primary) hypertension: Secondary | ICD-10-CM

## 2024-03-09 DIAGNOSIS — E1169 Type 2 diabetes mellitus with other specified complication: Secondary | ICD-10-CM

## 2024-03-09 DIAGNOSIS — E1165 Type 2 diabetes mellitus with hyperglycemia: Secondary | ICD-10-CM

## 2024-03-12 ENCOUNTER — Other Ambulatory Visit

## 2024-03-12 DIAGNOSIS — I10 Essential (primary) hypertension: Secondary | ICD-10-CM

## 2024-03-12 DIAGNOSIS — E785 Hyperlipidemia, unspecified: Secondary | ICD-10-CM | POA: Diagnosis not present

## 2024-03-12 DIAGNOSIS — E1169 Type 2 diabetes mellitus with other specified complication: Secondary | ICD-10-CM

## 2024-03-12 DIAGNOSIS — E1165 Type 2 diabetes mellitus with hyperglycemia: Secondary | ICD-10-CM | POA: Diagnosis not present

## 2024-03-12 DIAGNOSIS — Z794 Long term (current) use of insulin: Secondary | ICD-10-CM | POA: Diagnosis not present

## 2024-03-13 ENCOUNTER — Other Ambulatory Visit

## 2024-03-13 ENCOUNTER — Ambulatory Visit: Payer: Self-pay

## 2024-03-13 LAB — CBC WITH DIFFERENTIAL/PLATELET
Basophils Absolute: 0.1 10*3/uL (ref 0.0–0.2)
Basos: 1 %
EOS (ABSOLUTE): 0.3 10*3/uL (ref 0.0–0.4)
Eos: 3 %
Hematocrit: 51 % — ABNORMAL HIGH (ref 34.0–46.6)
Hemoglobin: 16.4 g/dL — ABNORMAL HIGH (ref 11.1–15.9)
Immature Grans (Abs): 0 10*3/uL (ref 0.0–0.1)
Immature Granulocytes: 0 %
Lymphocytes Absolute: 2.2 10*3/uL (ref 0.7–3.1)
Lymphs: 28 %
MCH: 29.9 pg (ref 26.6–33.0)
MCHC: 32.2 g/dL (ref 31.5–35.7)
MCV: 93 fL (ref 79–97)
Monocytes Absolute: 0.5 10*3/uL (ref 0.1–0.9)
Monocytes: 6 %
Neutrophils Absolute: 4.8 10*3/uL (ref 1.4–7.0)
Neutrophils: 62 %
Platelets: 214 10*3/uL (ref 150–450)
RBC: 5.49 x10E6/uL — ABNORMAL HIGH (ref 3.77–5.28)
RDW: 14.5 % (ref 11.7–15.4)
WBC: 7.9 10*3/uL (ref 3.4–10.8)

## 2024-03-13 LAB — COMPREHENSIVE METABOLIC PANEL WITH GFR
ALT: 11 IU/L (ref 0–32)
AST: 19 IU/L (ref 0–40)
Albumin: 4.4 g/dL (ref 3.9–4.9)
Alkaline Phosphatase: 71 IU/L (ref 44–121)
BUN/Creatinine Ratio: 19 (ref 9–23)
BUN: 16 mg/dL (ref 6–24)
Bilirubin Total: 0.4 mg/dL (ref 0.0–1.2)
CO2: 22 mmol/L (ref 20–29)
Calcium: 9.4 mg/dL (ref 8.7–10.2)
Chloride: 105 mmol/L (ref 96–106)
Creatinine, Ser: 0.84 mg/dL (ref 0.57–1.00)
Globulin, Total: 2.5 g/dL (ref 1.5–4.5)
Glucose: 133 mg/dL — ABNORMAL HIGH (ref 70–99)
Potassium: 4.3 mmol/L (ref 3.5–5.2)
Sodium: 140 mmol/L (ref 134–144)
Total Protein: 6.9 g/dL (ref 6.0–8.5)
eGFR: 85 mL/min/{1.73_m2} (ref >59)

## 2024-03-13 LAB — HEMOGLOBIN A1C
Est. average glucose Bld gHb Est-mCnc: 111 mg/dL
Hgb A1c MFr Bld: 5.5 % (ref 4.8–5.6)

## 2024-03-13 LAB — TSH: TSH: 1.51 u[IU]/mL (ref 0.450–4.500)

## 2024-03-13 LAB — LIPID PANEL
Chol/HDL Ratio: 3.6 ratio (ref 0.0–4.4)
Cholesterol, Total: 105 mg/dL (ref 100–199)
HDL: 29 mg/dL — ABNORMAL LOW (ref >39)
LDL Chol Calc (NIH): 53 mg/dL (ref 0–99)
Triglycerides: 128 mg/dL (ref 0–149)
VLDL Cholesterol Cal: 23 mg/dL (ref 5–40)

## 2024-03-20 ENCOUNTER — Other Ambulatory Visit: Payer: Self-pay

## 2024-03-20 ENCOUNTER — Ambulatory Visit (INDEPENDENT_AMBULATORY_CARE_PROVIDER_SITE_OTHER)

## 2024-03-20 ENCOUNTER — Other Ambulatory Visit (HOSPITAL_COMMUNITY): Payer: Self-pay

## 2024-03-20 VITALS — BP 139/80 | HR 71 | Ht 65.0 in | Wt 220.2 lb

## 2024-03-20 DIAGNOSIS — I2510 Atherosclerotic heart disease of native coronary artery without angina pectoris: Secondary | ICD-10-CM

## 2024-03-20 DIAGNOSIS — Z720 Tobacco use: Secondary | ICD-10-CM

## 2024-03-20 DIAGNOSIS — E1169 Type 2 diabetes mellitus with other specified complication: Secondary | ICD-10-CM | POA: Diagnosis not present

## 2024-03-20 DIAGNOSIS — E785 Hyperlipidemia, unspecified: Secondary | ICD-10-CM

## 2024-03-20 DIAGNOSIS — I214 Non-ST elevation (NSTEMI) myocardial infarction: Secondary | ICD-10-CM

## 2024-03-20 DIAGNOSIS — E1165 Type 2 diabetes mellitus with hyperglycemia: Secondary | ICD-10-CM | POA: Diagnosis not present

## 2024-03-20 DIAGNOSIS — Z1211 Encounter for screening for malignant neoplasm of colon: Secondary | ICD-10-CM | POA: Diagnosis not present

## 2024-03-20 DIAGNOSIS — Z794 Long term (current) use of insulin: Secondary | ICD-10-CM

## 2024-03-20 DIAGNOSIS — F32A Depression, unspecified: Secondary | ICD-10-CM | POA: Diagnosis not present

## 2024-03-20 DIAGNOSIS — Z23 Encounter for immunization: Secondary | ICD-10-CM | POA: Diagnosis not present

## 2024-03-20 MED ORDER — TIRZEPATIDE 10 MG/0.5ML ~~LOC~~ SOAJ
10.0000 mg | SUBCUTANEOUS | 1 refills | Status: DC
  Filled 2024-03-20: qty 6, 84d supply, fill #0
  Filled 2024-06-14 (×3): qty 6, 84d supply, fill #1

## 2024-03-20 MED ORDER — METFORMIN HCL 1000 MG PO TABS
1000.0000 mg | ORAL_TABLET | Freq: Every day | ORAL | 1 refills | Status: DC
  Filled 2024-03-20: qty 90, 90d supply, fill #0
  Filled 2024-08-12: qty 90, 90d supply, fill #1

## 2024-03-20 MED ORDER — ICOSAPENT ETHYL 1 G PO CAPS
2.0000 g | ORAL_CAPSULE | Freq: Two times a day (BID) | ORAL | 0 refills | Status: DC
  Filled 2024-03-20 (×2): qty 360, 90d supply, fill #0

## 2024-03-20 MED ORDER — DAPAGLIFLOZIN PROPANEDIOL 10 MG PO TABS
10.0000 mg | ORAL_TABLET | Freq: Every day | ORAL | 2 refills | Status: AC
Start: 2024-03-20 — End: ?
  Filled 2024-03-20: qty 30, 30d supply, fill #0
  Filled 2024-05-10: qty 30, 30d supply, fill #1
  Filled 2024-06-14 (×2): qty 30, 30d supply, fill #2
  Filled 2024-07-23 (×2): qty 30, 30d supply, fill #3
  Filled 2024-08-14 – 2024-08-31 (×2): qty 30, 30d supply, fill #4
  Filled 2024-09-28: qty 30, 30d supply, fill #5
  Filled 2024-10-24: qty 30, 30d supply, fill #6

## 2024-03-20 MED ORDER — ASPIRIN 81 MG PO TBEC
81.0000 mg | DELAYED_RELEASE_TABLET | Freq: Every day | ORAL | 0 refills | Status: DC
  Filled 2024-03-20 – 2024-05-10 (×2): qty 90, 90d supply, fill #0

## 2024-03-20 MED ORDER — DULOXETINE HCL 30 MG PO CPEP
90.0000 mg | ORAL_CAPSULE | Freq: Every day | ORAL | 1 refills | Status: DC
  Filled 2024-03-20: qty 270, 90d supply, fill #0
  Filled 2024-07-23 (×2): qty 270, 90d supply, fill #1

## 2024-03-20 MED ORDER — ATORVASTATIN CALCIUM 80 MG PO TABS
80.0000 mg | ORAL_TABLET | Freq: Every day | ORAL | 0 refills | Status: DC
  Filled 2024-03-20 – 2024-05-10 (×2): qty 90, 90d supply, fill #0

## 2024-03-20 MED ORDER — EZETIMIBE 10 MG PO TABS
10.0000 mg | ORAL_TABLET | Freq: Every day | ORAL | 0 refills | Status: DC
  Filled 2024-03-20 – 2024-05-10 (×2): qty 90, 90d supply, fill #0

## 2024-03-20 MED ORDER — METOPROLOL SUCCINATE ER 25 MG PO TB24
25.0000 mg | ORAL_TABLET | Freq: Every day | ORAL | 0 refills | Status: DC
  Filled 2024-03-20 – 2024-05-10 (×2): qty 90, 90d supply, fill #0

## 2024-03-20 MED ORDER — CLOPIDOGREL BISULFATE 75 MG PO TABS
75.0000 mg | ORAL_TABLET | Freq: Every day | ORAL | 0 refills | Status: DC
  Filled 2024-03-20 – 2024-05-10 (×2): qty 90, 90d supply, fill #0

## 2024-03-20 NOTE — Assessment & Plan Note (Addendum)
 Continue aspirin  81 mg daily, Plavix  75 mg daily, Farxiga  10 mg daily, Vascepa  2 g twice daily, metoprolol  XL 25 mg daily.  Patient has a scheduled follow-up with cardiology in July.  Advised patient to continue regular follow-ups with cardiology and will plan to coordinate care per their recommendations.

## 2024-03-20 NOTE — Patient Instructions (Signed)
 It was nice to see you today!  As we discussed in clinic:  -I have sent 90 day refills of all your medications to the [facility]. -I have also placed the referral for your colonoscopy. They will call you to schedule this!  -Please establish with an eye doctor at your convenience within this calendar year to have your yearly diabetic eye exam. -Continue with your diet changes - you are doing great!!  I will plan to see you back in 6 months for follow up, with fasting labs the week before!   If you have any problems before your next visit feel free to message me via MyChart (minor issues or questions) or call the office, otherwise you may reach out to schedule an office visit.  Thank you! Meryl Acosta, PA-C

## 2024-03-20 NOTE — Assessment & Plan Note (Signed)
 Stable.  Continue Cymbalta  30 mg 3 times a day.

## 2024-03-20 NOTE — Progress Notes (Signed)
 Complete physical exam  Patient: Renee Ho   DOB: 1976/07/11   48 y.o. Female  MRN: [id number]  Subjective:     Chief Complaint  Patient presents with   Annual Exam    Renee Ho is a 48 y.o. female who presents today for a complete physical exam. She reports consuming a general diet. The patient does not participate in regular exercise at present. She generally feels well. She reports sleeping well. She does not have additional problems to discuss today.  She reports that she has quit smoking and is avoiding pork, red meat, fried foods.  DM: Farxiga  10 mg daily, metformin  1000 mg twice daily, Mounjaro  7.5 mg weekly.  Reports excellent compliance with medications.  Denies side effects.  Denies new sores/wounds that are not healing.  Reports she has lost a total of 50 to 60 pounds.  HLD: Currently on atorvastatin  daily.  Denies side effects/new myalgias.  Reports excellent compliance.  Denies chest pain, shortness of breath, palpitations, edema.  Mood: Currently taking duloxetine  daily.  Patient reports stable/improved moods.  Denies side effects.  Denies SI/HI.  Of note patient has a history of NSTEMI followed by quadruple bypass surgery in November 2024.  Patient follows with cardiology and has a follow-up appointment in July.  He is also requesting 90-day refills on all of her medications as she is running low on all of them.  Most recent fall risk assessment:    11/22/2023   10:14 AM  Fall Risk   Falls in the past year? 0  Number falls in past yr: 0  Injury with Fall? 0  Risk for fall due to : No Fall Risks  Follow up Falls evaluation completed     Most recent depression screenings:    03/20/2024   10:17 AM 11/22/2023   10:14 AM  PHQ 2/9 Scores  PHQ - 2 Score 2 2  PHQ- 9 Score 7 9        Patient Care Team: Melene Sportsman as PCP - General (Physician Assistant) Knox Perl, MD as PCP - Cardiology (Cardiology) Maris Sickle, MD  (Ophthalmology)   Outpatient Medications Prior to Visit  Medication Sig   Blood Glucose Monitoring Suppl (BLOOD GLUCOSE MONITOR SYSTEM) w/Device KIT Use up to four times daily as directed.   glucose blood test strip Use as directed.   glucose blood test strip Use as directed.   Lancets (ONETOUCH DELICA PLUS LANCET30G) MISC Use as directed.   [DISCONTINUED] aspirin  EC 81 MG tablet Take 1 tablet (81 mg total) by mouth daily. Swallow whole.   [DISCONTINUED] atorvastatin  (LIPITOR ) 80 MG tablet Take 1 tablet (80 mg total) by mouth daily.   [DISCONTINUED] clopidogrel  (PLAVIX ) 75 MG tablet Take 1 tablet (75 mg total) by mouth daily.   [DISCONTINUED] dapagliflozin  propanediol (FARXIGA ) 10 MG TABS tablet Take 1 tablet (10 mg total) by mouth daily.   [DISCONTINUED] DULoxetine  (CYMBALTA ) 30 MG capsule Take 3 capsules (90 mg total) by mouth daily.   [DISCONTINUED] ezetimibe  (ZETIA ) 10 MG tablet Take 1 tablet (10 mg total) by mouth daily.   [DISCONTINUED] icosapent  Ethyl (VASCEPA ) 1 g capsule Take 2 capsules (2 g total) by mouth 2 (two) times daily.   [DISCONTINUED] metFORMIN  (GLUCOPHAGE ) 1000 MG tablet Take 1 tablet (1,000 mg total) by mouth daily with breakfast.   [DISCONTINUED] metoprolol  succinate (TOPROL  XL) 25 MG 24 hr tablet Take 1 tablet (25 mg total) by mouth daily.   [DISCONTINUED] tirzepatide  (MOUNJARO ) 10 MG/0.5ML Pen  Inject 10 mg into the skin once a week.   No facility-administered medications prior to visit.    ROS  As noted in HPI.      Objective:     BP 139/80   Pulse 71   Ht 5\' 5"  (1.651 m)   Wt 220 lb 4 oz (99.9 kg)   SpO2 95%   BMI 36.65 kg/m    Physical Exam Constitutional:      General: She is not in acute distress.    Appearance: Normal appearance.  HENT:     Mouth/Throat:     Mouth: Mucous membranes are moist.  Eyes:     Pupils: Pupils are equal, round, and reactive to light.  Cardiovascular:     Rate and Rhythm: Normal rate and regular rhythm.     Heart  sounds: Normal heart sounds. No murmur heard.    No friction rub. No gallop.  Pulmonary:     Effort: Pulmonary effort is normal. No respiratory distress.     Breath sounds: Normal breath sounds.  Musculoskeletal:        General: No swelling.  Skin:    General: Skin is warm and dry.  Neurological:     General: No focal deficit present.     Mental Status: She is alert.  Psychiatric:        Mood and Affect: Mood normal.        Behavior: Behavior normal.        Thought Content: Thought content normal.       No results found for any visits on 03/20/24. Last CBC Lab Results  Component Value Date   WBC 7.9 03/12/2024   HGB 16.4 (H) 03/12/2024   HCT 51.0 (H) 03/12/2024   MCV 93 03/12/2024   MCH 29.9 03/12/2024   RDW 14.5 03/12/2024   PLT 214 03/12/2024   Last metabolic panel Lab Results  Component Value Date   GLUCOSE 133 (H) 03/12/2024   NA 140 03/12/2024   K 4.3 03/12/2024   CL 105 03/12/2024   CO2 22 03/12/2024   BUN 16 03/12/2024   CREATININE 0.84 03/12/2024   EGFR 85 03/12/2024   CALCIUM  9.4 03/12/2024   PROT 6.9 03/12/2024   ALBUMIN  4.4 03/12/2024   LABGLOB 2.5 03/12/2024   AGRATIO 1.6 02/24/2022   BILITOT 0.4 03/12/2024   ALKPHOS 71 03/12/2024   AST 19 03/12/2024   ALT 11 03/12/2024   ANIONGAP 7 08/30/2023   Last lipids Lab Results  Component Value Date   CHOL 105 03/12/2024   HDL 29 (L) 03/12/2024   LDLCALC 53 03/12/2024   LDLDIRECT 123 (H) 07/23/2023   TRIG 128 03/12/2024   CHOLHDL 3.6 03/12/2024   Last hemoglobin A1c Lab Results  Component Value Date   HGBA1C 5.5 03/12/2024   Last thyroid functions Lab Results  Component Value Date   TSH 1.510 03/12/2024   Last vitamin D  Lab Results  Component Value Date   VD25OH 16.5 (L) 02/24/2022        Assessment & Plan:    Routine Health Maintenance and Physical Exam  Health Maintenance  Topic Date Due   Colon Cancer Screening  Never done   Eye exam for diabetics  08/31/2019   COVID-19  Vaccine (3 - 2024-25 season) 04/05/2024*   Zoster (Shingles) Vaccine (1 of 2) 06/20/2024*   Complete foot exam   03/21/2024   Flu Shot  05/11/2024   Hemoglobin A1C  06/12/2024   Mammogram  10/29/2024   Yearly  kidney health urinalysis for diabetes  11/21/2024   Yearly kidney function blood test for diabetes  03/12/2025   Lipid (cholesterol) test  03/12/2025   DTaP/Tdap/Td vaccine (8 - Td or Tdap) 11/21/2033   Pneumococcal Vaccination  Completed   Hepatitis C Screening  Completed   HIV Screening  Completed   HPV Vaccine  Aged Out   Meningitis B Vaccine  Aged Out  *Topic was postponed. The date shown is not the original due date.    Discussed health benefits of physical activity, and encouraged her to engage in regular exercise appropriate for her age and condition.  Screen for colon cancer -     Ambulatory referral to Gastroenterology  Depression, unspecified depression type Assessment & Plan: Stable.  Continue Cymbalta  30 mg 3 times a day.  Orders: -     DULoxetine  HCl; Take 3 capsules (90 mg total) by mouth daily.  Dispense: 270 capsule; Refill: 1  Type 2 diabetes mellitus with hyperglycemia, with long-term current use of insulin  (HCC) Assessment & Plan: A1c has improved from 6.2-5.5.  Continue Farxiga  10 mg daily, metformin  1000 mg once daily, Mounjaro  10 mg weekly. UACR up-to-date.  Courage patient to schedule dilated eye exam within the calendar year.  Will continue to monitor.  Orders: -     Dapagliflozin  Propanediol; Take 1 tablet (10 mg total) by mouth daily.  Dispense: 90 tablet; Refill: 2 -     metFORMIN  HCl; Take 1 tablet (1,000 mg total) by mouth daily with breakfast.  Dispense: 90 tablet; Refill: 1 -     Tirzepatide ; Inject 10 mg into the skin once a week.  Dispense: 6 mL; Refill: 1  Hyperlipidemia associated with type 2 diabetes mellitus (HCC) Assessment & Plan: Last lipid panel: LDL 123, HDL 29, triglycerides 128.  Stable/improved.  Continue atorvastatin  80 mg  daily as prescribed.  Will continue to monitor.  Orders: -     Ezetimibe ; Take 1 tablet (10 mg total) by mouth daily.  Dispense: 90 tablet; Refill: 0 -     Atorvastatin  Calcium ; Take 1 tablet (80 mg total) by mouth daily.  Dispense: 90 tablet; Refill: 0  Atherosclerosis of native coronary artery of native heart without angina pectoris -     Metoprolol  Succinate ER; Take 1 tablet (25 mg total) by mouth daily.  Dispense: 90 tablet; Refill: 0  Immunization due -     Pneumococcal conjugate vaccine 20-valent  Morbid obesity (HCC) Assessment & Plan: Patient currently on Mounjaro  10 mg weekly for A1c control however has lost 50 to 60 pounds since starting this medication and after her heart attack and October.  Encourage patient to continue with her diet lifestyle changes.  Will continue to monitor.   Tobacco abuse Assessment & Plan: Patient reports she has quit smoking since October 2024.  She would like to defer discussion of low-dose chest CT for lung cancer screening until her next visit.   NSTEMI (non-ST elevated myocardial infarction) [facility]) Assessment & Plan: Continue aspirin  81 mg daily, Plavix  75 mg daily, Farxiga  10 mg daily, Vascepa  2 g twice daily, metoprolol  XL 25 mg daily.  Patient has a scheduled follow-up with cardiology in July.  Advised patient to continue regular follow-ups with cardiology and will plan to coordinate care per their recommendations.   Other orders -     Icosapent  Ethyl; Take 2 capsules (2 g total) by mouth 2 (two) times daily.  Dispense: 360 capsule; Refill: 0 -     Aspirin ; Take 1 tablet (  81 mg total) by mouth daily. Swallow whole.  Dispense: 90 tablet; Refill: 0 -     Clopidogrel  Bisulfate; Take 1 tablet (75 mg total) by mouth daily.  Dispense: 90 tablet; Refill: 0    Return in about 6 months (around 09/19/2024) for HTN, DM, HLD, Mood.     Odilia Bennett, PA-C

## 2024-03-20 NOTE — Assessment & Plan Note (Signed)
 Patient reports she has quit smoking since October 2024.  She would like to defer discussion of low-dose chest CT for lung cancer screening until her next visit.

## 2024-03-20 NOTE — Assessment & Plan Note (Signed)
 Patient currently on Mounjaro  10 mg weekly for A1c control however has lost 50 to 60 pounds since starting this medication and after her heart attack and October.  Encourage patient to continue with her diet lifestyle changes.  Will continue to monitor.

## 2024-03-20 NOTE — Assessment & Plan Note (Signed)
 Last lipid panel: LDL 123, HDL 29, triglycerides 128.  Stable/improved.  Continue atorvastatin  80 mg daily as prescribed.  Will continue to monitor.

## 2024-03-20 NOTE — Assessment & Plan Note (Signed)
 A1c has improved from 6.2-5.5.  Continue Farxiga  10 mg daily, metformin  1000 mg once daily, Mounjaro  10 mg weekly. UACR up-to-date.  Courage patient to schedule dilated eye exam within the calendar year.  Will continue to monitor.

## 2024-05-08 ENCOUNTER — Ambulatory Visit: Admitting: Cardiology

## 2024-05-10 ENCOUNTER — Other Ambulatory Visit: Payer: Self-pay

## 2024-05-10 ENCOUNTER — Other Ambulatory Visit (HOSPITAL_COMMUNITY): Payer: Self-pay

## 2024-05-14 ENCOUNTER — Other Ambulatory Visit (HOSPITAL_COMMUNITY): Payer: Self-pay

## 2024-06-14 ENCOUNTER — Other Ambulatory Visit (HOSPITAL_COMMUNITY): Payer: Self-pay

## 2024-06-15 ENCOUNTER — Other Ambulatory Visit (HOSPITAL_COMMUNITY): Payer: Self-pay

## 2024-06-15 NOTE — Progress Notes (Signed)
 Cardiology Office Note:  .   Date:  06/18/2024  ID:  Renee Ho, DOB [birth year], MRN [id number] PCP: Gayle Saddie [name]  [facility] Providers Cardiologist:  Gordy Bergamo, MD   History of Present Illness: .   Renee Ho is a 48 y.o.  with a past medical history of DM-II, HLD, pancreatitis related to Ozempic  but now tolerating Mounjaro , CAD with history of inferior STEMI underwent stenting to CX followed by CABG 3 months later due to multivessel disease (Nov 2024 LIMA to LAD, SVG SEQUENTIALLY to DIAGONAL 2 and 1, SVG to PDA), h/o tobacco abuse quit after MI, OSA on CPAP and obesity.   She now presents for 16-month follow-up of CAD and for management of medications and evaluation for need for DAPT and consideration for initiation of ACE inhibitor/ARB.  She continues to lose weight, has remained abstinent from tobacco use, has lost close to 60 pounds in weight since initiation of Mounjaro  and having made lifestyle changes.  She is asymptomatic.  Cardiac Studies relevent.    ECHOCARDIOGRAM LIMITED 08/16/2023  Left ventricular ejection fraction, by estimation, is 60 to 65%. The left ventricle has normal function. The left ventricle has no regional wall motion abnormalities. There is moderate asymmetric left ventricular hypertrophy of the basal-septal segment.  CABG 08/19/2023: LIMA to LAD, SVG to D1-2, SVG to PDA.  Coronary angiogram 07/23/2023: Stenting of prox Cx with SYNERGY XD 3.0X12.         Rec: CABG after 3 months of DAPT.      Discussed the use of AI scribe software for clinical note transcription with the patient, who gave verbal consent to proceed.  History of Present Illness Renee Ho is a 48 year old female with coronary artery disease and diabetes who presents for a cardiovascular follow-up.  She has lost 63 pounds through lifestyle changes, including dietary modifications, and is now able to cross her legs, which  she could not do before. Her current medication regimen includes Plavix  and atorvastatin  for cholesterol management. She was previously on metoprolol  12.5 mg twice daily. Her diabetes management has been effective, with her A1c reducing from 10.7% to 5.5% due to medication and lifestyle changes, including reducing pork and red meat intake. She is compliant with her treatment regimen. She engages in physical activities such as walking to the park and experiences no chest pain or issues with current physical activity levels.  Labs   Lab Results  Component Value Date   CHOL 105 03/12/2024   HDL 29 (L) 03/12/2024   LDLCALC 53 03/12/2024   LDLDIRECT 123 (H) 07/23/2023   TRIG 128 03/12/2024   CHOLHDL 3.6 03/12/2024   Lipoprotein (a)  Date/Time Value Ref Range Status  07/25/2023 04:59 AM 30.2 (H) <75.0 nmol/L Final    Comment:    (NOTE) Note:  Values greater than or equal to 75.0 nmol/L may       indicate an independent risk factor for CHD,       but must be evaluated with caution when applied       to non-Caucasian populations due to the       influence of genetic factors on Lp(a) across       ethnicities. Performed At: [facility] [address] [place], [place] [id number] Jennette Shorter MD Ey:[id number]     Recent Labs    08/28/23 0459 08/29/23 0624 08/30/23 0637 09/28/23 0852 03/12/24 0914  NA 138 136 139 142 140  K 3.6  4.0 3.9 4.2 4.3  CL 100 97* 99 103 105  CO2 30 31 33* 21 22  GLUCOSE 119* 142* 124* 174* 133*  BUN 10 13 14 17 16   CREATININE 1.04* 1.12* 1.13* 0.88 0.84  CALCIUM  8.3* 8.4* 8.2* 9.4 9.4  GFRNONAA >60 >60 60*  --   --     Lab Results  Component Value Date   ALT 11 03/12/2024   AST 19 03/12/2024   ALKPHOS 71 03/12/2024   BILITOT 0.4 03/12/2024      Latest Ref Rng & Units 03/12/2024    9:14 AM 09/28/2023    8:52 AM 08/30/2023    6:37 AM  CBC  WBC 3.4 - 10.8 x10E3/uL 7.9  8.5  8.7   Hemoglobin 11.1 - 15.9 g/dL 83.5  85.7  8.6   Hematocrit  34.0 - 46.6 % 51.0  46.3  27.4   Platelets 150 - 450 x10E3/uL 214  260  250    Lab Results  Component Value Date   HGBA1C 5.5 03/12/2024    Lab Results  Component Value Date   TSH 1.510 03/12/2024     ROS  Review of Systems  Cardiovascular:  Negative for chest pain, dyspnea on exertion and leg swelling.   Physical Exam:   VS:  BP 118/76 (BP Location: Left Arm, Patient Position: Sitting, Cuff Size: Large)   Pulse 72   Resp 16   Ht 5' 5 (1.651 m)   Wt 219 lb (99.3 kg)   SpO2 97%   BMI 36.44 kg/m    Wt Readings from Last 3 Encounters:  06/18/24 219 lb (99.3 kg)  03/20/24 220 lb 4 oz (99.9 kg)  01/06/24 220 lb 6.4 oz (100 kg)    BP Readings from Last 3 Encounters:  06/18/24 118/76  03/20/24 139/80  01/06/24 112/74   Physical Exam Neck:     Vascular: No carotid bruit or JVD.  Cardiovascular:     Rate and Rhythm: Normal rate and regular rhythm.     Pulses: Intact distal pulses.     Heart sounds: Normal heart sounds. No murmur heard.    No gallop.  Pulmonary:     Effort: Pulmonary effort is normal.     Breath sounds: Normal breath sounds.  Abdominal:     General: Bowel sounds are normal.     Palpations: Abdomen is soft.  Musculoskeletal:     Right lower leg: No edema.     Left lower leg: No edema.    EKG:         ASSESSMENT AND PLAN: .      ICD-10-CM   1. Atherosclerosis of native coronary artery of native heart without angina pectoris  I25.10 losartan  (COZAAR ) 25 MG tablet    Basic metabolic panel with GFR    2. Hyperlipidemia LDL goal <55  E78.5     3. Type 2 diabetes mellitus without complication, without long-term current use of insulin  (HCC)  E11.9 Hemoglobin A1c     Assessment & Plan Atherosclerotic heart disease of native coronary artery without angina pectoris Currently well-managed with no chest pain. Plavix  is preferred over aspirin  due to better tolerance and outcomes. Metoprolol  is being discontinued in favor of losartan  to provide renal  and ocular protection due to her diabetes status. Losartan  is expected to have minimal side effects, with rare occurrences of anaphylaxis and dry cough, particularly in Caucasians. - Discontinue aspirin . - Continue Plavix . - Discontinue metoprolol . - Initiate losartan  for CV protection - With  weight loss her hypertension is now resolved.  - BMP in 2 weeks  Type 2 diabetes mellitus without complications Well-controlled with an A1c of 5.5%, in the non-diabetic range. Significant weight loss has contributed to improved glycemic control. Losartan  is being added to protect renal function. - Order A1c in two weeks. - Order BMP in two weeks. - She has lost close to 60 pounds since being on Mounjaro , she is also on Farxiga .  Hyperlipidemia Managed with atorvastatin , currently at a high dose of 80 mg. She is tolerating the medication well. She is also on Zetia  with excellent control of her lipids.   Goals of Care No specific goals of care or advanced directives were discussed during this encounter.   Follow up: 1 Year  Signed,  Gordy Bergamo, MD, [facility] 06/18/2024, 9:00 AM [facility] [address] [place], [place] [address] Phone: [phone]. Fax:  [phone]

## 2024-06-18 ENCOUNTER — Encounter: Payer: Self-pay | Admitting: Cardiology

## 2024-06-18 ENCOUNTER — Ambulatory Visit: Payer: Self-pay | Attending: Cardiology | Admitting: Cardiology

## 2024-06-18 ENCOUNTER — Other Ambulatory Visit (HOSPITAL_COMMUNITY): Payer: Self-pay

## 2024-06-18 VITALS — BP 118/76 | HR 72 | Resp 16 | Ht 65.0 in | Wt 219.0 lb

## 2024-06-18 DIAGNOSIS — I2510 Atherosclerotic heart disease of native coronary artery without angina pectoris: Secondary | ICD-10-CM | POA: Diagnosis not present

## 2024-06-18 DIAGNOSIS — E785 Hyperlipidemia, unspecified: Secondary | ICD-10-CM

## 2024-06-18 DIAGNOSIS — E119 Type 2 diabetes mellitus without complications: Secondary | ICD-10-CM | POA: Diagnosis not present

## 2024-06-18 DIAGNOSIS — I10 Essential (primary) hypertension: Secondary | ICD-10-CM

## 2024-06-18 MED ORDER — LOSARTAN POTASSIUM 25 MG PO TABS
25.0000 mg | ORAL_TABLET | Freq: Every day | ORAL | 3 refills | Status: AC
  Filled 2024-06-18 – 2024-08-14 (×2): qty 90, 90d supply, fill #0
  Filled 2024-10-24: qty 90, 90d supply, fill #1

## 2024-06-18 NOTE — Patient Instructions (Signed)
 Medication Instructions:  STOP Aspirin   STOP Metoprolol  succinate   START Losartan  25 mg daily *If you need a refill on your cardiac medications before your next appointment, please call your pharmacy*  Lab Work in 2 Weeks: BMP A1C If you have labs (blood work) drawn today and your tests are completely normal, you will receive your results only by: MyChart Message (if you have MyChart) OR A paper copy in the mail If you have any lab test that is abnormal or we need to change your treatment, we will call you to review the results.  Testing/Procedures: NONE  Follow-Up: At [facility], you and your health needs are our priority.  As part of our continuing mission to provide you with exceptional heart care, our providers are all part of one team.  This team includes your primary Cardiologist (physician) and Advanced Practice Providers or APPs (Physician Assistants and Nurse Practitioners) who all work together to provide you with the care you need, when you need it.  Your next appointment:   1 Year  Provider:   Gordy Bergamo, MD

## 2024-06-27 ENCOUNTER — Other Ambulatory Visit (HOSPITAL_COMMUNITY): Payer: Self-pay

## 2024-07-23 ENCOUNTER — Other Ambulatory Visit (HOSPITAL_COMMUNITY): Payer: Self-pay

## 2024-08-12 ENCOUNTER — Other Ambulatory Visit: Payer: Self-pay

## 2024-08-12 DIAGNOSIS — E1169 Type 2 diabetes mellitus with other specified complication: Secondary | ICD-10-CM

## 2024-08-13 ENCOUNTER — Other Ambulatory Visit: Payer: Self-pay

## 2024-08-13 ENCOUNTER — Other Ambulatory Visit (HOSPITAL_COMMUNITY): Payer: Self-pay

## 2024-08-13 MED ORDER — CLOPIDOGREL BISULFATE 75 MG PO TABS
75.0000 mg | ORAL_TABLET | Freq: Every day | ORAL | 0 refills | Status: DC
  Filled 2024-08-13: qty 90, 90d supply, fill #0

## 2024-08-13 MED ORDER — EZETIMIBE 10 MG PO TABS
10.0000 mg | ORAL_TABLET | Freq: Every day | ORAL | 0 refills | Status: DC
  Filled 2024-08-13: qty 90, 90d supply, fill #0

## 2024-08-13 MED ORDER — ATORVASTATIN CALCIUM 80 MG PO TABS
80.0000 mg | ORAL_TABLET | Freq: Every day | ORAL | 0 refills | Status: DC
  Filled 2024-08-13: qty 90, 90d supply, fill #0

## 2024-08-14 ENCOUNTER — Other Ambulatory Visit: Payer: Self-pay

## 2024-08-14 ENCOUNTER — Other Ambulatory Visit (HOSPITAL_COMMUNITY): Payer: Self-pay

## 2024-08-14 MED ORDER — ICOSAPENT ETHYL 1 G PO CAPS
2.0000 g | ORAL_CAPSULE | Freq: Two times a day (BID) | ORAL | 0 refills | Status: DC
  Filled 2024-08-14: qty 360, 90d supply, fill #0

## 2024-08-31 ENCOUNTER — Other Ambulatory Visit (HOSPITAL_COMMUNITY): Payer: Self-pay

## 2024-09-05 ENCOUNTER — Other Ambulatory Visit: Payer: Self-pay

## 2024-09-05 ENCOUNTER — Other Ambulatory Visit (HOSPITAL_COMMUNITY): Payer: Self-pay

## 2024-09-05 DIAGNOSIS — E1165 Type 2 diabetes mellitus with hyperglycemia: Secondary | ICD-10-CM

## 2024-09-05 MED ORDER — MOUNJARO 10 MG/0.5ML ~~LOC~~ SOAJ
10.0000 mg | SUBCUTANEOUS | 1 refills | Status: DC
  Filled 2024-09-05: qty 6, 84d supply, fill #0

## 2024-09-10 ENCOUNTER — Other Ambulatory Visit: Payer: Self-pay

## 2024-09-10 DIAGNOSIS — E1165 Type 2 diabetes mellitus with hyperglycemia: Secondary | ICD-10-CM

## 2024-09-10 DIAGNOSIS — E1169 Type 2 diabetes mellitus with other specified complication: Secondary | ICD-10-CM

## 2024-09-12 ENCOUNTER — Other Ambulatory Visit

## 2024-09-12 DIAGNOSIS — E1165 Type 2 diabetes mellitus with hyperglycemia: Secondary | ICD-10-CM | POA: Diagnosis not present

## 2024-09-12 DIAGNOSIS — E1169 Type 2 diabetes mellitus with other specified complication: Secondary | ICD-10-CM | POA: Diagnosis not present

## 2024-09-12 DIAGNOSIS — Z794 Long term (current) use of insulin: Secondary | ICD-10-CM | POA: Diagnosis not present

## 2024-09-12 DIAGNOSIS — E785 Hyperlipidemia, unspecified: Secondary | ICD-10-CM | POA: Diagnosis not present

## 2024-09-13 ENCOUNTER — Ambulatory Visit: Payer: Self-pay

## 2024-09-13 LAB — LIPID PANEL
Chol/HDL Ratio: 3 ratio (ref 0.0–4.4)
Cholesterol, Total: 95 mg/dL — ABNORMAL LOW (ref 100–199)
HDL: 32 mg/dL — ABNORMAL LOW (ref >39)
LDL Chol Calc (NIH): 44 mg/dL (ref 0–99)
Triglycerides: 99 mg/dL (ref 0–149)
VLDL Cholesterol Cal: 19 mg/dL (ref 5–40)

## 2024-09-13 LAB — COMPREHENSIVE METABOLIC PANEL WITH GFR
ALT: 7 IU/L (ref 0–32)
AST: 18 IU/L (ref 0–40)
Albumin: 4.3 g/dL (ref 3.9–4.9)
Alkaline Phosphatase: 57 IU/L (ref 41–116)
BUN/Creatinine Ratio: 19 (ref 9–23)
BUN: 15 mg/dL (ref 6–24)
Bilirubin Total: 0.5 mg/dL (ref 0.0–1.2)
CO2: 23 mmol/L (ref 20–29)
Calcium: 9.7 mg/dL (ref 8.7–10.2)
Chloride: 104 mmol/L (ref 96–106)
Creatinine, Ser: 0.81 mg/dL (ref 0.57–1.00)
Globulin, Total: 2.6 g/dL (ref 1.5–4.5)
Glucose: 135 mg/dL — ABNORMAL HIGH (ref 70–99)
Potassium: 4.6 mmol/L (ref 3.5–5.2)
Sodium: 143 mmol/L (ref 134–144)
Total Protein: 6.9 g/dL (ref 6.0–8.5)
eGFR: 88 mL/min/1.73 (ref >59)

## 2024-09-13 LAB — CBC WITH DIFFERENTIAL/PLATELET
Basophils Absolute: 0.1 x10E3/uL (ref 0.0–0.2)
Basos: 1 %
EOS (ABSOLUTE): 0.2 x10E3/uL (ref 0.0–0.4)
Eos: 3 %
Hematocrit: 49.2 % — ABNORMAL HIGH (ref 34.0–46.6)
Hemoglobin: 15.9 g/dL (ref 11.1–15.9)
Immature Grans (Abs): 0 x10E3/uL (ref 0.0–0.1)
Immature Granulocytes: 0 %
Lymphocytes Absolute: 2.2 x10E3/uL (ref 0.7–3.1)
Lymphs: 28 %
MCH: 29.9 pg (ref 26.6–33.0)
MCHC: 32.3 g/dL (ref 31.5–35.7)
MCV: 93 fL (ref 79–97)
Monocytes Absolute: 0.5 x10E3/uL (ref 0.1–0.9)
Monocytes: 7 %
Neutrophils Absolute: 5 x10E3/uL (ref 1.4–7.0)
Neutrophils: 61 %
Platelets: 182 x10E3/uL (ref 150–450)
RBC: 5.31 x10E6/uL — ABNORMAL HIGH (ref 3.77–5.28)
RDW: 13.2 % (ref 11.7–15.4)
WBC: 8.1 x10E3/uL (ref 3.4–10.8)

## 2024-09-13 LAB — HEMOGLOBIN A1C
Est. average glucose Bld gHb Est-mCnc: 126 mg/dL
Hgb A1c MFr Bld: 6 % — ABNORMAL HIGH (ref 4.8–5.6)

## 2024-09-13 LAB — TSH: TSH: 1.34 u[IU]/mL (ref 0.450–4.500)

## 2024-09-13 LAB — VITAMIN D 25 HYDROXY (VIT D DEFICIENCY, FRACTURES): Vit D, 25-Hydroxy: 16.1 ng/mL — ABNORMAL LOW (ref 30.0–100.0)

## 2024-09-19 ENCOUNTER — Ambulatory Visit

## 2024-09-21 ENCOUNTER — Ambulatory Visit

## 2024-09-21 ENCOUNTER — Other Ambulatory Visit (HOSPITAL_COMMUNITY): Payer: Self-pay

## 2024-09-21 VITALS — BP 119/73 | HR 70 | Temp 98.4°F | Ht 65.0 in | Wt 218.0 lb

## 2024-09-21 DIAGNOSIS — I25110 Atherosclerotic heart disease of native coronary artery with unstable angina pectoris: Secondary | ICD-10-CM | POA: Diagnosis not present

## 2024-09-21 DIAGNOSIS — F32A Depression, unspecified: Secondary | ICD-10-CM | POA: Diagnosis not present

## 2024-09-21 DIAGNOSIS — E1169 Type 2 diabetes mellitus with other specified complication: Secondary | ICD-10-CM

## 2024-09-21 DIAGNOSIS — G4733 Obstructive sleep apnea (adult) (pediatric): Secondary | ICD-10-CM | POA: Diagnosis not present

## 2024-09-21 DIAGNOSIS — M5412 Radiculopathy, cervical region: Secondary | ICD-10-CM | POA: Insufficient documentation

## 2024-09-21 DIAGNOSIS — S90421A Blister (nonthermal), right great toe, initial encounter: Secondary | ICD-10-CM | POA: Diagnosis not present

## 2024-09-21 DIAGNOSIS — E785 Hyperlipidemia, unspecified: Secondary | ICD-10-CM | POA: Diagnosis not present

## 2024-09-21 DIAGNOSIS — E559 Vitamin D deficiency, unspecified: Secondary | ICD-10-CM | POA: Diagnosis not present

## 2024-09-21 DIAGNOSIS — Z9889 Other specified postprocedural states: Secondary | ICD-10-CM

## 2024-09-21 DIAGNOSIS — E1165 Type 2 diabetes mellitus with hyperglycemia: Secondary | ICD-10-CM

## 2024-09-21 DIAGNOSIS — Z794 Long term (current) use of insulin: Secondary | ICD-10-CM | POA: Diagnosis not present

## 2024-09-21 DIAGNOSIS — Z122 Encounter for screening for malignant neoplasm of respiratory organs: Secondary | ICD-10-CM

## 2024-09-21 MED ORDER — CHOLECALCIFEROL 1.25 MG (50000 UT) PO CAPS
50000.0000 [IU] | ORAL_CAPSULE | ORAL | 0 refills | Status: DC
  Filled 2024-09-21: qty 12, fill #0
  Filled 2024-09-27: qty 12, 84d supply, fill #0

## 2024-09-21 MED ORDER — METHOCARBAMOL 750 MG PO TABS
750.0000 mg | ORAL_TABLET | Freq: Three times a day (TID) | ORAL | 0 refills | Status: AC | PRN
  Filled 2024-09-21: qty 30, 10d supply, fill #0

## 2024-09-21 MED ORDER — MUPIROCIN 2 % EX OINT
1.0000 | TOPICAL_OINTMENT | Freq: Two times a day (BID) | CUTANEOUS | 0 refills | Status: AC
  Filled 2024-09-21: qty 22, 30d supply, fill #0

## 2024-09-21 MED ORDER — TIRZEPATIDE 12.5 MG/0.5ML ~~LOC~~ SOAJ
12.5000 mg | SUBCUTANEOUS | 1 refills | Status: AC
  Filled 2024-09-21 – 2024-11-13 (×4): qty 6, 84d supply, fill #0

## 2024-09-21 NOTE — Assessment & Plan Note (Signed)
 A1c stable at goal at 6.0. Continue Farxiga  10 mg daily, Metformin  1000 mg once daily, and will increase Mounjaro  from 10 mg to 12.5 mg weekly. Foot exam performed today. Referral placed to optometry today for diabetic eye exam. Will cont to monitor.

## 2024-09-21 NOTE — Assessment & Plan Note (Signed)
 Last lipid panel: LDL 44, HDL 32, Trig 99. Improved. Continue atorvastatin  80 mg daily, zetia  10 mg daily, and vascepa  2g BID. Continue regular follow up with cardiology. Will cont to monitor,.

## 2024-09-21 NOTE — Assessment & Plan Note (Signed)
 On DAPT and lipids controlled through atorvastatin , zetia , and vascepa . A1c and BP well controlled. Continue regular follow up with cardiology.

## 2024-09-21 NOTE — Assessment & Plan Note (Signed)
 Low vitamin D  levels causing fatigue and cognitive issues. - Prescribe vitamin D  50,000 units weekly for 12 weeks. - Transition to OTC vitamin D  2,000 units daily after 12 weeks.

## 2024-09-21 NOTE — Assessment & Plan Note (Signed)
 Painful blister on toe due to friction, no infection. Increased infection risk due to diabetes. - Apply Mupirocin  ointment twice daily. - Leave wound uncovered at bedtime. - Use Mepolex dressing during the day to prevent further friction and worsening of blister.  - Send picture if wound worsens and will refer to podiatry for proactive management.

## 2024-09-21 NOTE — Progress Notes (Signed)
 Established Patient Office Visit  Subjective   Patient ID: Renee Ho, female    DOB: [birth year]  Age: 48 y.o. MRN: [id number]  Chief Complaint  Patient presents with   Medical Management of Chronic Issues    HPI  History of Present Illness   Renee Ho is a 48 year old female who presents for routine follow up.   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  - Pain noticed upon removing sock, attributed to friction from sock seam line  - Blister is painful to touch, especially in the center - Uses  Mepilex dressings for cushioning at work, removes dressings at home - Seeks additional measures to promote healing given her hx of DM   Diabetes mellitus - Diabetes managed with Mounjaro , metformin , and Farxiga  - A1c is well controlled - Currently taking Mounjaro  10 mg and metformin  once daily - Prefers to increase Mounjaro  rather than metformin  for glycemic control  Obstructive sleep apnea - Severe obstructive sleep apnea - Uses CPAP machine nightly  Cardiovascular history - History of heart attack. Followed by cardiology once per year.  - Quit smoking over a year ago after heart attack - Previously a heavy smoker since age 85 (1 pdd)  - Takes losartan  for blood pressure - Takes atorvastatin , Zetia , and Vascepa  for cholesterol management - Takes Plavix   Mood disorder - Takes Cymbalta  90 mg daily for mood. - Mood well controlled on current regimen   Right arm pain - Intermittent aching and burning pain in upper right arm - Pain sometimes prevents lifting the arm - Pain does not radiate to the hand but radiates down to the wrist at times  - No known injury. Has been going on for several years off and on.  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    Objective:     BP 119/73   Pulse 70   Temp 98.4 F (36.9 C) (Oral)   Ht 5' 5 (1.651 m)   Wt 218 lb 0.6 oz (98.9 kg)   SpO2 96%   BMI 36.28 kg/m    Physical Exam Constitutional:      General: She is not in acute distress.    Appearance: Normal appearance.  Cardiovascular:     Rate and Rhythm: Normal rate and regular rhythm.     Heart sounds: Normal heart sounds. No murmur heard.    No friction rub. No gallop.  Pulmonary:     Effort: Pulmonary effort is normal. No respiratory distress.     Breath sounds: Normal breath sounds.  Abdominal:     General: Bowel sounds are normal.  Musculoskeletal:        General: No swelling.     Cervical back: Neck supple.       Feet:  Feet:     Right foot:     Protective Sensation: 10 sites tested.  10 sites sensed.     Left foot:  Protective Sensation: 10 sites tested.  10 sites sensed.     Comments: There is a small red blister (epidermis only) over the ventral right, great big toe not involving the nail bed. It is TTP but no surround erythema, swelling, or fluctuance.  Skin:    General: Skin is warm and dry.  Neurological:     General: No focal deficit present.     Mental Status: She is alert.  Psychiatric:        Mood and Affect: Mood normal.        Behavior: Behavior normal.        Thought Content: Thought content normal.      No results found for any visits on 09/21/24.  Last CBC Lab Results  Component Value Date   WBC 8.1 09/12/2024   HGB 15.9 09/12/2024   HCT 49.2 (H) 09/12/2024   MCV 93 09/12/2024   MCH 29.9 09/12/2024   RDW 13.2 09/12/2024   PLT 182 09/12/2024   Last metabolic panel Lab  Results  Component Value Date   GLUCOSE 135 (H) 09/12/2024   NA 143 09/12/2024   K 4.6 09/12/2024   CL 104 09/12/2024   CO2 23 09/12/2024   BUN 15 09/12/2024   CREATININE 0.81 09/12/2024   EGFR 88 09/12/2024   CALCIUM  9.7 09/12/2024   PROT 6.9 09/12/2024   ALBUMIN  4.3 09/12/2024   LABGLOB 2.6 09/12/2024   AGRATIO 1.6 02/24/2022   BILITOT 0.5 09/12/2024   ALKPHOS 57 09/12/2024   AST 18 09/12/2024   ALT 7 09/12/2024   ANIONGAP 7 08/30/2023   Last lipids Lab Results  Component Value Date   CHOL 95 (L) 09/12/2024   HDL 32 (L) 09/12/2024   LDLCALC 44 09/12/2024   LDLDIRECT 123 (H) 07/23/2023   TRIG 99 09/12/2024   CHOLHDL 3.0 09/12/2024   Last hemoglobin A1c Lab Results  Component Value Date   HGBA1C 6.0 (H) 09/12/2024   Last thyroid functions Lab Results  Component Value Date   TSH 1.340 09/12/2024   FREET4 1.06 02/22/2008   Last vitamin D  Lab Results  Component Value Date   VD25OH 16.1 (L) 09/12/2024      The ASCVD Risk score (Arnett DK, et al., 2019) failed to calculate for the following reasons:   Risk score cannot be calculated because patient has a medical history suggesting prior/existing ASCVD   * - Cholesterol units were assumed    Assessment & Plan:   Screening for lung cancer -     CT CHEST LUNG CANCER SCREENING LOW DOSE WO CONTRAST; Future  Type 2 diabetes mellitus with hyperglycemia, with long-term current use of insulin  (HCC) Assessment & Plan: A1c stable at goal at 6.0. Continue Farxiga  10 mg daily, Metformin  1000 mg once daily, and will increase Mounjaro  from 10 mg to 12.5 mg weekly. Foot exam performed today. Referral placed to optometry today for diabetic eye exam. Will cont to monitor.   Orders: -     Ambulatory referral to Ophthalmology  OSA on CPAP Assessment & Plan: Stable on CPAP    Hyperlipidemia associated with type 2 diabetes mellitus (HCC) Assessment & Plan: Last lipid panel: LDL 44, HDL 32, Trig 99. Improved. Continue  atorvastatin  80 mg daily, zetia  10 mg daily, and vascepa  2g BID. Continue regular follow up with cardiology. Will cont to monitor,.   H/O colonoscopy with polypectomy Assessment & Plan: Discussed colonoscopy today. Patient would like to defer.    Depression, unspecified depression type Assessment & Plan:  Stable. Continue Cymbalta  30 mg 3 times a day.    Coronary artery disease involving native coronary artery of native heart with unstable angina pectoris [facility]) Assessment & Plan: On DAPT and lipids controlled through atorvastatin , zetia , and vascepa . A1c and BP well controlled. Continue regular follow up with cardiology.    Vitamin D  deficiency Assessment & Plan: Low vitamin D  levels causing fatigue and cognitive issues. - Prescribe vitamin D  50,000 units weekly for 12 weeks. - Transition to OTC vitamin D  2,000 units daily after 12 weeks.   Blister of great toe of right foot, initial encounter Assessment & Plan: Painful blister on toe due to friction, no infection. Increased infection risk due to diabetes. - Apply Mupirocin  ointment twice daily. - Leave wound uncovered at bedtime. - Use Mepolex dressing during the day to prevent further friction and worsening of blister.  - Send picture if wound worsens and will refer to podiatry for proactive management.    Right cervical radiculopathy Assessment & Plan: Chronic neck and shoulder pain with radiation, likely cervical radiculopathy. NSAIDs contraindicated. - Prescribe methocarbamol  as needed. - Consider imaging if methocarbamol  ineffective. - Referral to orthopedics to consider steroid injections if imaging shows nerve compression.   Other orders -     Mupirocin ; Apply 1 Application topically 2 (two) times daily.  Dispense: 22 g; Refill: 0 -     Cholecalciferol; Take 50,000 Units by mouth once a week.  Dispense: 12 tablet; Refill: 0 -     Tirzepatide ; Inject 12.5 mg into the skin once a week.  Dispense: 6 mL; Refill: 1 -      Methocarbamol ; Take 1 tablet (750 mg total) by mouth every 8 (eight) hours as needed for muscle spasms.  Dispense: 30 tablet; Refill: 0    Return in about 6 months (around 03/22/2025) for HTN, DM, HLD, Mood.    Saddie JULIANNA Sacks, PA-C

## 2024-09-21 NOTE — Assessment & Plan Note (Signed)
 Chronic neck and shoulder pain with radiation, likely cervical radiculopathy. NSAIDs contraindicated. - Prescribe methocarbamol  as needed. - Consider imaging if methocarbamol  ineffective. - Referral to orthopedics to consider steroid injections if imaging shows nerve compression.

## 2024-09-21 NOTE — Assessment & Plan Note (Signed)
 Discussed colonoscopy today. Patient would like to defer.

## 2024-09-21 NOTE — Patient Instructions (Signed)
 VISIT SUMMARY: Today, we addressed your painful toe blister, diabetes management, vitamin D  deficiency, and other ongoing health issues. We made adjustments to your medications and provided specific instructions for wound care and follow-up actions.  YOUR PLAN: DIABETIC FOOT WOUND: You have a painful blister on your toe caused by friction, with an increased risk of infection due to diabetes. -Apply Pearson ointment twice daily. -Leave the wound uncovered at bedtime. -Use Mepolex dressing during the day. -Send a picture if the wound worsens.  TYPE 2 DIABETES MELLITUS: Your A1c level has increased to 6.0. We aim to improve your A1c and support weight loss. -Increase Mounjaro  to 12.5 mg. -Continue taking metformin  once daily.  VITAMIN D  DEFICIENCY: Your low vitamin D  levels are causing fatigue and cognitive issues. -Take vitamin D  50,000 units weekly for 12 weeks. -After 12 weeks, switch to over-the-counter vitamin D  2,000 units daily.  HYPERTENSION: Your blood pressure is well controlled with your current medication. -Continue your current antihypertensive regimen.  HYPERLIPIDEMIA: Your cholesterol levels are well controlled with your current medications. -Continue taking atorvastatin , Zetia , and Vascepa .  OBSTRUCTIVE SLEEP APNEA: Your severe obstructive sleep apnea is managed with a CPAP machine. -Continue using your CPAP machine nightly.  HISTORY OF MYOCARDIAL INFARCTION: You had a recent cardiology follow-up with no current cardiac issues. -Continue your current cardiac medications.  CERVICAL RADICULOPATHY: You have chronic neck and shoulder pain likely due to cervical radiculopathy. -Take methocarbamol  as needed. -Consider imaging if methocarbamol  is ineffective. -Discuss steroid injections if imaging shows nerve compression.  GENERAL HEALTH MAINTENANCE: Routine health maintenance was discussed. -Schedule an eye exam and provide records. -Order a lung cancer screening CT  scan.  If you have any problems before your next visit feel free to message me via MyChart (minor issues or questions) or call the office, otherwise you may reach out to schedule an office visit.  Thank you! Saddie Sacks, PA-C

## 2024-09-21 NOTE — Assessment & Plan Note (Signed)
 Stable on CPAP

## 2024-09-21 NOTE — Assessment & Plan Note (Signed)
 Stable.  Continue Cymbalta  30 mg 3 times a day.

## 2024-09-27 ENCOUNTER — Other Ambulatory Visit (HOSPITAL_COMMUNITY): Payer: Self-pay

## 2024-09-28 ENCOUNTER — Other Ambulatory Visit: Payer: Self-pay

## 2024-09-28 ENCOUNTER — Other Ambulatory Visit (HOSPITAL_COMMUNITY): Payer: Self-pay

## 2024-10-05 ENCOUNTER — Ambulatory Visit
Admission: RE | Admit: 2024-10-05 | Discharge: 2024-10-05 | Disposition: A | Discharge status: Home / Self Care | Source: Ambulatory Visit

## 2024-10-05 DIAGNOSIS — Z122 Encounter for screening for malignant neoplasm of respiratory organs: Secondary | ICD-10-CM

## 2024-10-08 DIAGNOSIS — M5412 Radiculopathy, cervical region: Secondary | ICD-10-CM

## 2024-10-09 ENCOUNTER — Other Ambulatory Visit: Payer: Self-pay

## 2024-10-09 DIAGNOSIS — M5412 Radiculopathy, cervical region: Secondary | ICD-10-CM

## 2024-10-09 NOTE — Progress Notes (Signed)
 Patient with chronic right sided cervical radiculopathy > 6 weeks. Right shoulder and neck pain with sharp pains that radiate down to right hand. No known injury. NSAIDs contraindicated due to patient being chronically anticoagulated. Pain not improving with muscle relaxer and tylenol . [facility] refused referral without imaging being done.  MRI c-spine order placed to [facility] for further evaluation.   Saddie JULIANNA Sacks, PA-C

## 2024-10-14 ENCOUNTER — Ambulatory Visit: Payer: Self-pay

## 2024-10-21 ENCOUNTER — Ambulatory Visit: Admission: RE | Admit: 2024-10-21 | Discharge: 2024-10-21 | Disposition: A | Source: Ambulatory Visit

## 2024-10-21 DIAGNOSIS — M5412 Radiculopathy, cervical region: Secondary | ICD-10-CM

## 2024-10-24 ENCOUNTER — Other Ambulatory Visit (HOSPITAL_COMMUNITY): Payer: Self-pay

## 2024-10-24 ENCOUNTER — Other Ambulatory Visit: Payer: Self-pay

## 2024-10-24 DIAGNOSIS — E1165 Type 2 diabetes mellitus with hyperglycemia: Secondary | ICD-10-CM

## 2024-10-24 DIAGNOSIS — F32A Depression, unspecified: Secondary | ICD-10-CM

## 2024-10-24 DIAGNOSIS — E1169 Type 2 diabetes mellitus with other specified complication: Secondary | ICD-10-CM

## 2024-10-24 MED ORDER — ICOSAPENT ETHYL 1 G PO CAPS
2.0000 g | ORAL_CAPSULE | Freq: Two times a day (BID) | ORAL | 0 refills | Status: AC
  Filled 2024-10-24: qty 360, 90d supply, fill #0

## 2024-10-24 MED ORDER — DULOXETINE HCL 30 MG PO CPEP
90.0000 mg | ORAL_CAPSULE | Freq: Every day | ORAL | 1 refills | Status: AC
  Filled 2024-10-24: qty 270, 90d supply, fill #0

## 2024-10-24 MED ORDER — EZETIMIBE 10 MG PO TABS
10.0000 mg | ORAL_TABLET | Freq: Every day | ORAL | 0 refills | Status: AC
  Filled 2024-10-24: qty 90, 90d supply, fill #0

## 2024-10-24 MED ORDER — METFORMIN HCL 1000 MG PO TABS
1000.0000 mg | ORAL_TABLET | Freq: Every day | ORAL | 1 refills | Status: AC
  Filled 2024-10-24: qty 90, 90d supply, fill #0

## 2024-10-24 MED ORDER — CLOPIDOGREL BISULFATE 75 MG PO TABS
75.0000 mg | ORAL_TABLET | Freq: Every day | ORAL | 0 refills | Status: AC
  Filled 2024-10-24: qty 90, 90d supply, fill #0

## 2024-10-24 MED ORDER — ATORVASTATIN CALCIUM 80 MG PO TABS
80.0000 mg | ORAL_TABLET | Freq: Every day | ORAL | 0 refills | Status: AC
  Filled 2024-10-24: qty 90, 90d supply, fill #0

## 2024-10-24 MED ORDER — D3-50 1.25 MG (50000 UT) PO CAPS
50000.0000 [IU] | ORAL_CAPSULE | ORAL | 0 refills | Status: AC
  Filled 2024-10-24: qty 12, 84d supply, fill #0

## 2024-10-25 ENCOUNTER — Other Ambulatory Visit (HOSPITAL_COMMUNITY): Payer: Self-pay

## 2024-10-25 ENCOUNTER — Other Ambulatory Visit: Payer: Self-pay

## 2024-11-04 ENCOUNTER — Ambulatory Visit: Payer: Self-pay

## 2024-11-13 ENCOUNTER — Other Ambulatory Visit (HOSPITAL_COMMUNITY): Payer: Self-pay

## 2024-11-14 ENCOUNTER — Other Ambulatory Visit (HOSPITAL_COMMUNITY): Payer: Self-pay

## 2024-11-14 ENCOUNTER — Other Ambulatory Visit: Payer: Self-pay

## 2025-03-15 ENCOUNTER — Other Ambulatory Visit

## 2025-03-22 ENCOUNTER — Ambulatory Visit
